# Patient Record
Sex: Male | Born: 1945 | Race: White | Hispanic: No | Marital: Married | Smoking: Former smoker
Health system: Southern US, Community
[De-identification: ages and names within clinical notes are randomized; demographics above are authoritative.]

## PROBLEM LIST (undated history)

## (undated) DIAGNOSIS — K219 Gastro-esophageal reflux disease without esophagitis: Secondary | ICD-10-CM

## (undated) DIAGNOSIS — N183 Chronic kidney disease, stage 3 unspecified: Secondary | ICD-10-CM

## (undated) DIAGNOSIS — K649 Unspecified hemorrhoids: Secondary | ICD-10-CM

## (undated) DIAGNOSIS — M51369 Other intervertebral disc degeneration, lumbar region without mention of lumbar back pain or lower extremity pain: Secondary | ICD-10-CM

## (undated) DIAGNOSIS — K2970 Gastritis, unspecified, without bleeding: Secondary | ICD-10-CM

## (undated) DIAGNOSIS — I5030 Unspecified diastolic (congestive) heart failure: Secondary | ICD-10-CM

## (undated) DIAGNOSIS — D649 Anemia, unspecified: Secondary | ICD-10-CM

## (undated) DIAGNOSIS — E119 Type 2 diabetes mellitus without complications: Secondary | ICD-10-CM

## (undated) DIAGNOSIS — Z794 Long term (current) use of insulin: Secondary | ICD-10-CM

## (undated) DIAGNOSIS — Z77090 Contact with and (suspected) exposure to asbestos: Secondary | ICD-10-CM

## (undated) DIAGNOSIS — I739 Peripheral vascular disease, unspecified: Secondary | ICD-10-CM

## (undated) DIAGNOSIS — J45909 Unspecified asthma, uncomplicated: Secondary | ICD-10-CM

## (undated) DIAGNOSIS — I700 Atherosclerosis of aorta: Secondary | ICD-10-CM

## (undated) DIAGNOSIS — I480 Paroxysmal atrial fibrillation: Secondary | ICD-10-CM

## (undated) DIAGNOSIS — J449 Chronic obstructive pulmonary disease, unspecified: Secondary | ICD-10-CM

## (undated) DIAGNOSIS — M5136 Other intervertebral disc degeneration, lumbar region: Secondary | ICD-10-CM

## (undated) DIAGNOSIS — I10 Essential (primary) hypertension: Secondary | ICD-10-CM

## (undated) DIAGNOSIS — J8410 Pulmonary fibrosis, unspecified: Secondary | ICD-10-CM

## (undated) DIAGNOSIS — E785 Hyperlipidemia, unspecified: Secondary | ICD-10-CM

## (undated) DIAGNOSIS — I50810 Right heart failure, unspecified: Secondary | ICD-10-CM

## (undated) DIAGNOSIS — J189 Pneumonia, unspecified organism: Secondary | ICD-10-CM

## (undated) DIAGNOSIS — B9681 Helicobacter pylori [H. pylori] as the cause of diseases classified elsewhere: Secondary | ICD-10-CM

## (undated) DIAGNOSIS — R0600 Dyspnea, unspecified: Secondary | ICD-10-CM

## (undated) DIAGNOSIS — N471 Phimosis: Secondary | ICD-10-CM

## (undated) DIAGNOSIS — I509 Heart failure, unspecified: Secondary | ICD-10-CM

## (undated) DIAGNOSIS — N400 Enlarged prostate without lower urinary tract symptoms: Secondary | ICD-10-CM

## (undated) DIAGNOSIS — N189 Chronic kidney disease, unspecified: Secondary | ICD-10-CM

## (undated) DIAGNOSIS — M109 Gout, unspecified: Secondary | ICD-10-CM

## (undated) DIAGNOSIS — I499 Cardiac arrhythmia, unspecified: Secondary | ICD-10-CM

## (undated) DIAGNOSIS — Z7901 Long term (current) use of anticoagulants: Secondary | ICD-10-CM

## (undated) DIAGNOSIS — I2510 Atherosclerotic heart disease of native coronary artery without angina pectoris: Secondary | ICD-10-CM

## (undated) DIAGNOSIS — I4891 Unspecified atrial fibrillation: Secondary | ICD-10-CM

## (undated) HISTORY — DX: Atherosclerotic heart disease of native coronary artery without angina pectoris: I25.10

## (undated) HISTORY — DX: Chronic kidney disease, unspecified: N18.9

## (undated) HISTORY — PX: HAND SURGERY: SHX662

## (undated) HISTORY — PX: BACK SURGERY: SHX140

## (undated) HISTORY — PX: CARDIAC ELECTROPHYSIOLOGY MAPPING AND ABLATION: SHX1292

## (undated) HISTORY — DX: Cardiac arrhythmia, unspecified: I49.9

---

## 1997-11-23 ENCOUNTER — Ambulatory Visit (HOSPITAL_COMMUNITY): Admission: RE | Admit: 1997-11-23 | Discharge: 1997-11-23 | Payer: Self-pay | Admitting: Neurological Surgery

## 1998-07-16 ENCOUNTER — Encounter: Payer: Self-pay | Admitting: Neurological Surgery

## 1998-07-16 ENCOUNTER — Ambulatory Visit (HOSPITAL_COMMUNITY): Admission: RE | Admit: 1998-07-16 | Discharge: 1998-07-16 | Payer: Self-pay | Admitting: Neurological Surgery

## 1999-12-09 ENCOUNTER — Ambulatory Visit (HOSPITAL_COMMUNITY): Admission: RE | Admit: 1999-12-09 | Discharge: 1999-12-09 | Payer: Self-pay | Admitting: Family Medicine

## 1999-12-09 ENCOUNTER — Encounter: Payer: Self-pay | Admitting: Family Medicine

## 2000-01-01 ENCOUNTER — Ambulatory Visit (HOSPITAL_COMMUNITY): Admission: RE | Admit: 2000-01-01 | Discharge: 2000-01-01 | Payer: Self-pay | Admitting: Family Medicine

## 2000-01-01 ENCOUNTER — Encounter: Payer: Self-pay | Admitting: Family Medicine

## 2001-09-08 ENCOUNTER — Other Ambulatory Visit: Admission: RE | Admit: 2001-09-08 | Discharge: 2001-09-08 | Payer: Self-pay | Admitting: Family Medicine

## 2014-05-21 DIAGNOSIS — I480 Paroxysmal atrial fibrillation: Secondary | ICD-10-CM | POA: Insufficient documentation

## 2014-05-21 DIAGNOSIS — E785 Hyperlipidemia, unspecified: Secondary | ICD-10-CM | POA: Insufficient documentation

## 2015-10-28 DIAGNOSIS — M5136 Other intervertebral disc degeneration, lumbar region: Secondary | ICD-10-CM | POA: Insufficient documentation

## 2015-11-03 ENCOUNTER — Other Ambulatory Visit: Payer: Self-pay | Admitting: Specialist

## 2015-11-03 DIAGNOSIS — M5136 Other intervertebral disc degeneration, lumbar region: Secondary | ICD-10-CM

## 2015-11-20 ENCOUNTER — Ambulatory Visit
Admission: RE | Admit: 2015-11-20 | Discharge: 2015-11-20 | Disposition: A | Discharge status: Home / Self Care | Payer: Medicare Other | Source: Ambulatory Visit | Attending: Specialist | Admitting: Specialist

## 2015-11-20 DIAGNOSIS — M47816 Spondylosis without myelopathy or radiculopathy, lumbar region: Secondary | ICD-10-CM | POA: Diagnosis not present

## 2015-11-20 DIAGNOSIS — M5136 Other intervertebral disc degeneration, lumbar region: Secondary | ICD-10-CM | POA: Diagnosis not present

## 2016-01-04 ENCOUNTER — Inpatient Hospital Stay
Admission: EM | Admit: 2016-01-04 | Discharge: 2016-01-09 | DRG: 177 | Disposition: A | Discharge status: Home / Self Care | Payer: Medicare Other | Attending: Internal Medicine | Admitting: Internal Medicine

## 2016-01-04 ENCOUNTER — Encounter: Payer: Self-pay | Admitting: Emergency Medicine

## 2016-01-04 ENCOUNTER — Emergency Department: Payer: Medicare Other

## 2016-01-04 DIAGNOSIS — E1165 Type 2 diabetes mellitus with hyperglycemia: Secondary | ICD-10-CM | POA: Diagnosis present

## 2016-01-04 DIAGNOSIS — Z7901 Long term (current) use of anticoagulants: Secondary | ICD-10-CM

## 2016-01-04 DIAGNOSIS — J690 Pneumonitis due to inhalation of food and vomit: Principal | ICD-10-CM | POA: Diagnosis present

## 2016-01-04 DIAGNOSIS — T380X5A Adverse effect of glucocorticoids and synthetic analogues, initial encounter: Secondary | ICD-10-CM | POA: Diagnosis present

## 2016-01-04 DIAGNOSIS — F1910 Other psychoactive substance abuse, uncomplicated: Secondary | ICD-10-CM

## 2016-01-04 DIAGNOSIS — I10 Essential (primary) hypertension: Secondary | ICD-10-CM | POA: Diagnosis present

## 2016-01-04 DIAGNOSIS — E860 Dehydration: Secondary | ICD-10-CM | POA: Diagnosis present

## 2016-01-04 DIAGNOSIS — Z794 Long term (current) use of insulin: Secondary | ICD-10-CM

## 2016-01-04 DIAGNOSIS — I4891 Unspecified atrial fibrillation: Secondary | ICD-10-CM | POA: Diagnosis present

## 2016-01-04 DIAGNOSIS — Z91013 Allergy to seafood: Secondary | ICD-10-CM

## 2016-01-04 DIAGNOSIS — R1110 Vomiting, unspecified: Secondary | ICD-10-CM

## 2016-01-04 DIAGNOSIS — Z79899 Other long term (current) drug therapy: Secondary | ICD-10-CM

## 2016-01-04 DIAGNOSIS — J441 Chronic obstructive pulmonary disease with (acute) exacerbation: Secondary | ICD-10-CM | POA: Diagnosis present

## 2016-01-04 DIAGNOSIS — R042 Hemoptysis: Secondary | ICD-10-CM | POA: Diagnosis present

## 2016-01-04 DIAGNOSIS — N400 Enlarged prostate without lower urinary tract symptoms: Secondary | ICD-10-CM | POA: Diagnosis present

## 2016-01-04 DIAGNOSIS — J9601 Acute respiratory failure with hypoxia: Secondary | ICD-10-CM | POA: Diagnosis present

## 2016-01-04 DIAGNOSIS — Z87891 Personal history of nicotine dependence: Secondary | ICD-10-CM | POA: Diagnosis not present

## 2016-01-04 DIAGNOSIS — E871 Hypo-osmolality and hyponatremia: Secondary | ICD-10-CM | POA: Diagnosis present

## 2016-01-04 DIAGNOSIS — N179 Acute kidney failure, unspecified: Secondary | ICD-10-CM | POA: Diagnosis present

## 2016-01-04 DIAGNOSIS — E785 Hyperlipidemia, unspecified: Secondary | ICD-10-CM | POA: Diagnosis present

## 2016-01-04 DIAGNOSIS — Z7984 Long term (current) use of oral hypoglycemic drugs: Secondary | ICD-10-CM

## 2016-01-04 DIAGNOSIS — Z8249 Family history of ischemic heart disease and other diseases of the circulatory system: Secondary | ICD-10-CM

## 2016-01-04 DIAGNOSIS — R509 Fever, unspecified: Secondary | ICD-10-CM

## 2016-01-04 DIAGNOSIS — J189 Pneumonia, unspecified organism: Secondary | ICD-10-CM | POA: Diagnosis present

## 2016-01-04 HISTORY — DX: Benign prostatic hyperplasia without lower urinary tract symptoms: N40.0

## 2016-01-04 HISTORY — DX: Hyperlipidemia, unspecified: E78.5

## 2016-01-04 HISTORY — DX: Unspecified atrial fibrillation: I48.91

## 2016-01-04 HISTORY — DX: Chronic obstructive pulmonary disease, unspecified: J44.9

## 2016-01-04 HISTORY — DX: Essential (primary) hypertension: I10

## 2016-01-04 HISTORY — DX: Type 2 diabetes mellitus without complications: E11.9

## 2016-01-04 LAB — CBC
HCT: 35.7 % — ABNORMAL LOW (ref 40.0–52.0)
HEMOGLOBIN: 12.2 g/dL — AB (ref 13.0–18.0)
MCH: 30.9 pg (ref 26.0–34.0)
MCHC: 34.1 g/dL (ref 32.0–36.0)
MCV: 90.7 fL (ref 80.0–100.0)
PLATELETS: 172 10*3/uL (ref 150–440)
RBC: 3.94 MIL/uL — AB (ref 4.40–5.90)
RDW: 16.1 % — ABNORMAL HIGH (ref 11.5–14.5)
WBC: 11.8 10*3/uL — ABNORMAL HIGH (ref 3.8–10.6)

## 2016-01-04 LAB — LACTIC ACID, PLASMA
LACTIC ACID, VENOUS: 1.5 mmol/L (ref 0.5–2.0)
Lactic Acid, Venous: 1.7 mmol/L (ref 0.5–2.0)

## 2016-01-04 LAB — TROPONIN I

## 2016-01-04 LAB — GLUCOSE, CAPILLARY: Glucose-Capillary: 237 mg/dL — ABNORMAL HIGH (ref 65–99)

## 2016-01-04 LAB — COMPREHENSIVE METABOLIC PANEL
ALK PHOS: 25 U/L — AB (ref 38–126)
ALT: 15 U/L — AB (ref 17–63)
ANION GAP: 12 (ref 5–15)
AST: 20 U/L (ref 15–41)
Albumin: 3.3 g/dL — ABNORMAL LOW (ref 3.5–5.0)
BILIRUBIN TOTAL: 1.6 mg/dL — AB (ref 0.3–1.2)
BUN: 22 mg/dL — ABNORMAL HIGH (ref 6–20)
CALCIUM: 8.2 mg/dL — AB (ref 8.9–10.3)
CO2: 23 mmol/L (ref 22–32)
CREATININE: 1.44 mg/dL — AB (ref 0.61–1.24)
Chloride: 95 mmol/L — ABNORMAL LOW (ref 101–111)
GFR calc non Af Amer: 47 mL/min — ABNORMAL LOW (ref >60)
GFR, EST AFRICAN AMERICAN: 55 mL/min — AB (ref >60)
Glucose, Bld: 212 mg/dL — ABNORMAL HIGH (ref 65–99)
Potassium: 3.8 mmol/L (ref 3.5–5.1)
Sodium: 130 mmol/L — ABNORMAL LOW (ref 135–145)
TOTAL PROTEIN: 6.9 g/dL (ref 6.5–8.1)

## 2016-01-04 LAB — LIPASE, BLOOD: Lipase: 21 U/L (ref 11–51)

## 2016-01-04 MED ORDER — SODIUM CHLORIDE 0.9% FLUSH
3.0000 mL | Freq: Two times a day (BID) | INTRAVENOUS | Status: DC
  Administered 2016-01-05 – 2016-01-08 (×7): 3 mL via INTRAVENOUS

## 2016-01-04 MED ORDER — METOPROLOL TARTRATE 25 MG PO TABS
25.0000 mg | ORAL_TABLET | Freq: Two times a day (BID) | ORAL | Status: DC
  Administered 2016-01-04 – 2016-01-09 (×10): 25 mg via ORAL
  Filled 2016-01-04 (×10): qty 1

## 2016-01-04 MED ORDER — ONDANSETRON HCL 4 MG/2ML IJ SOLN
4.0000 mg | Freq: Once | INTRAMUSCULAR | Status: AC
  Administered 2016-01-04: 4 mg via INTRAVENOUS
  Filled 2016-01-04: qty 2

## 2016-01-04 MED ORDER — DEXTROSE 5 % IV SOLN: 1.0000 g | Freq: Once | INTRAVENOUS | Status: DC

## 2016-01-04 MED ORDER — INSULIN ASPART 100 UNIT/ML ~~LOC~~ SOLN
0.0000 [IU] | Freq: Every day | SUBCUTANEOUS | Status: DC
  Administered 2016-01-04: 2 [IU] via SUBCUTANEOUS
  Administered 2016-01-05: 4 [IU] via SUBCUTANEOUS
  Filled 2016-01-04: qty 2
  Filled 2016-01-04: qty 4

## 2016-01-04 MED ORDER — LEVOFLOXACIN IN D5W 750 MG/150ML IV SOLN: 750.0000 mg | INTRAVENOUS | Status: DC

## 2016-01-04 MED ORDER — IPRATROPIUM-ALBUTEROL 0.5-2.5 (3) MG/3ML IN SOLN
3.0000 mL | RESPIRATORY_TRACT | Status: DC | PRN
  Administered 2016-01-04 – 2016-01-05 (×3): 3 mL via RESPIRATORY_TRACT
  Filled 2016-01-04 (×4): qty 3

## 2016-01-04 MED ORDER — LEVOFLOXACIN IN D5W 750 MG/150ML IV SOLN
750.0000 mg | Freq: Once | INTRAVENOUS | Status: AC
  Administered 2016-01-04: 750 mg via INTRAVENOUS
  Filled 2016-01-04: qty 150

## 2016-01-04 MED ORDER — TAMSULOSIN HCL 0.4 MG PO CAPS
0.4000 mg | ORAL_CAPSULE | Freq: Every evening | ORAL | Status: DC
  Administered 2016-01-04 – 2016-01-08 (×5): 0.4 mg via ORAL
  Filled 2016-01-04 (×5): qty 1

## 2016-01-04 MED ORDER — DEXTROSE 5 % IV SOLN: 500.0000 mg | Freq: Once | INTRAVENOUS | Status: DC

## 2016-01-04 MED ORDER — ALBUTEROL SULFATE (2.5 MG/3ML) 0.083% IN NEBU
5.0000 mg | INHALATION_SOLUTION | Freq: Once | RESPIRATORY_TRACT | Status: AC
  Administered 2016-01-04: 5 mg via RESPIRATORY_TRACT
  Filled 2016-01-04: qty 6

## 2016-01-04 MED ORDER — ONDANSETRON HCL 4 MG/2ML IJ SOLN
4.0000 mg | Freq: Four times a day (QID) | INTRAMUSCULAR | Status: DC | PRN
  Administered 2016-01-04 – 2016-01-05 (×2): 4 mg via INTRAVENOUS
  Filled 2016-01-04 (×2): qty 2

## 2016-01-04 MED ORDER — ACETAMINOPHEN 650 MG RE SUPP: 650.0000 mg | Freq: Four times a day (QID) | RECTAL | Status: DC | PRN

## 2016-01-04 MED ORDER — OMEGA-3-ACID ETHYL ESTERS 1 G PO CAPS
4.0000 g | ORAL_CAPSULE | Freq: Every day | ORAL | Status: DC
  Administered 2016-01-05 – 2016-01-09 (×5): 4 g via ORAL
  Filled 2016-01-04 (×5): qty 4

## 2016-01-04 MED ORDER — INSULIN GLARGINE 100 UNIT/ML ~~LOC~~ SOLN
70.0000 [IU] | Freq: Two times a day (BID) | SUBCUTANEOUS | Status: DC
  Administered 2016-01-04 – 2016-01-09 (×10): 70 [IU] via SUBCUTANEOUS
  Filled 2016-01-04 (×12): qty 0.7

## 2016-01-04 MED ORDER — SODIUM CHLORIDE 0.9 % IV SOLN
INTRAVENOUS | Status: DC
  Administered 2016-01-04: 20:00:00 via INTRAVENOUS

## 2016-01-04 MED ORDER — METHOCARBAMOL 500 MG PO TABS
500.0000 mg | ORAL_TABLET | Freq: Two times a day (BID) | ORAL | Status: DC
  Administered 2016-01-04 – 2016-01-09 (×10): 500 mg via ORAL
  Filled 2016-01-04 (×10): qty 1

## 2016-01-04 MED ORDER — ATORVASTATIN CALCIUM 20 MG PO TABS
20.0000 mg | ORAL_TABLET | Freq: Every day | ORAL | Status: DC
  Administered 2016-01-04 – 2016-01-08 (×5): 20 mg via ORAL
  Filled 2016-01-04 (×5): qty 1

## 2016-01-04 MED ORDER — ONDANSETRON 4 MG PO TBDP
4.0000 mg | ORAL_TABLET | Freq: Once | ORAL | Status: DC | PRN
Start: 2016-01-04 — End: 2016-01-04
  Filled 2016-01-04: qty 1

## 2016-01-04 MED ORDER — DABIGATRAN ETEXILATE MESYLATE 150 MG PO CAPS
150.0000 mg | ORAL_CAPSULE | Freq: Two times a day (BID) | ORAL | Status: DC
  Administered 2016-01-04 – 2016-01-05 (×2): 150 mg via ORAL
  Filled 2016-01-04 (×4): qty 1

## 2016-01-04 MED ORDER — INSULIN ASPART 100 UNIT/ML ~~LOC~~ SOLN
0.0000 [IU] | Freq: Three times a day (TID) | SUBCUTANEOUS | Status: DC
  Administered 2016-01-05: 2 [IU] via SUBCUTANEOUS
  Administered 2016-01-05: 5 [IU] via SUBCUTANEOUS
  Administered 2016-01-05: 2 [IU] via SUBCUTANEOUS
  Administered 2016-01-06: 5 [IU] via SUBCUTANEOUS
  Filled 2016-01-04 (×2): qty 5
  Filled 2016-01-04 (×2): qty 2

## 2016-01-04 MED ORDER — ONDANSETRON HCL 4 MG PO TABS: 4.0000 mg | ORAL_TABLET | Freq: Four times a day (QID) | ORAL | Status: DC | PRN

## 2016-01-04 MED ORDER — ACETAMINOPHEN 325 MG PO TABS
650.0000 mg | ORAL_TABLET | Freq: Four times a day (QID) | ORAL | Status: DC | PRN
  Administered 2016-01-05 – 2016-01-08 (×3): 650 mg via ORAL
  Filled 2016-01-04 (×3): qty 2

## 2016-01-04 MED ORDER — SODIUM CHLORIDE 0.9 % IV BOLUS (SEPSIS)
1000.0000 mL | Freq: Once | INTRAVENOUS | Status: AC
  Administered 2016-01-04: 1000 mL via INTRAVENOUS

## 2016-01-04 MED ORDER — FLUTICASONE FUROATE-VILANTEROL 100-25 MCG/INH IN AEPB
1.0000 | INHALATION_SPRAY | Freq: Every day | RESPIRATORY_TRACT | Status: DC
  Administered 2016-01-05 – 2016-01-09 (×5): 1 via RESPIRATORY_TRACT
  Filled 2016-01-04: qty 28

## 2016-01-04 NOTE — H&P (Addendum)
Jay Morris at [place] NAME: Jay Morris    MR#:  [id number]  DATE OF BIRTH:  1946-05-17  DATE OF ADMISSION:  01/04/2016  PRIMARY CARE PHYSICIAN: Default, Provider, MD   REQUESTING/REFERRING PHYSICIAN: Dr. Harvest Dark  CHIEF COMPLAINT:   Chief Complaint  Patient presents with  . Emesis  . Fever    HISTORY OF PRESENT ILLNESS:  [name]  is a 70 y.o. male with a known history of COPD, diabetes, hypertension, BPH, atrial fibrillation, hyperlipidemia presents to the hospital due to fevers chills cough and shortness of breath progressively getting worse over the past 3-4 days. Patient says that he has had a cough productive with clear sputum now for the past few days associated with significant shortness of breath and exertion. He also admits to some chills but no documented fever. He also has had some intermittent vomiting associated with his shortness of breath which has been bilious and nonbloody in nature. He complains of some mild chest pain and tightness upon exertion. He presented to the ER with the above complaints and was noted to be in acute respiratory failure secondary to pneumonia and hospitalist services were contacted for further treatment and evaluation.  Patient denies any recent sick contacts or being admitted to the hospital for any other causes recently.  PAST MEDICAL HISTORY:   Past Medical History  Diagnosis Date  . COPD (chronic obstructive pulmonary disease) ([place])   . Diabetes mellitus without complication ([place])   . Hypertension   . BPH (benign prostatic hyperplasia)   . Atrial fibrillation ([place])   . Hyperlipidemia     PAST SURGICAL HISTORY:  No past surgical history on file.  SOCIAL HISTORY:   Social History  Substance Use Topics  . Smoking status: Former Smoker -- 1.00 packs/day for 30 years    Types: Cigarettes  . Smokeless tobacco: Not on file  . Alcohol Use: No    FAMILY HISTORY:   Family  History  Problem Relation Age of Onset  . Heart disease Mother   . Cancer Father     DRUG ALLERGIES:   Allergies  Allergen Reactions  . Shellfish-Derived Products Anaphylaxis    REVIEW OF SYSTEMS:   Review of Systems  Constitutional: Positive for chills. Negative for fever and weight loss.  HENT: Negative for congestion, nosebleeds and tinnitus.   Eyes: Negative for blurred vision, double vision and redness.  Respiratory: Positive for cough, sputum production and shortness of breath. Negative for hemoptysis.   Cardiovascular: Negative for chest pain, orthopnea, leg swelling and PND.  Gastrointestinal: Positive for nausea and vomiting. Negative for abdominal pain, diarrhea and melena.  Genitourinary: Negative for dysuria, urgency and hematuria.  Musculoskeletal: Negative for joint pain and falls.  Neurological: Negative for dizziness, tingling, sensory change, focal weakness, seizures, weakness and headaches.  Endo/Heme/Allergies: Negative for polydipsia. Does not bruise/bleed easily.  Psychiatric/Behavioral: Negative for depression and memory loss. The patient is not nervous/anxious.     MEDICATIONS AT HOME:   Prior to Admission medications   Medication Sig Start Date End Date Taking? Authorizing Provider  atorvastatin (LIPITOR) 20 MG tablet Take 20 mg by mouth at bedtime. 12/09/15  Yes Historical Provider, MD  BREO ELLIPTA 100-25 MCG/INH AEPB Inhale 1 puff into the lungs daily. 12/04/15  Yes Historical Provider, MD  furosemide (LASIX) 20 MG tablet Take 20 mg by mouth daily. 12/29/15  Yes Historical Provider, MD  LANTUS 100 UNIT/ML injection Inject 70 Units into the skin 2 (  two) times daily. 12/29/15  Yes Historical Provider, MD  lisinopril (PRINIVIL,ZESTRIL) 40 MG tablet Take 40 mg by mouth at bedtime.  12/29/15  Yes Historical Provider, MD  metFORMIN (GLUCOPHAGE) 1000 MG tablet Take 1,000 mg by mouth 2 (two) times daily. 12/17/15  Yes Historical Provider, MD  methocarbamol  (ROBAXIN) 500 MG tablet Take 500 mg by mouth 2 (two) times daily. 12/31/15  Yes Historical Provider, MD  metoprolol tartrate (LOPRESSOR) 25 MG tablet Take 25 mg by mouth 2 (two) times daily. 12/04/15  Yes Historical Provider, MD  omega-3 acid ethyl esters (LOVAZA) 1 g capsule Take 4 g by mouth daily. 12/29/15  Yes Historical Provider, MD  PRADAXA 150 MG CAPS capsule Take 150 mg by mouth 2 (two) times daily. 12/11/15  Yes Historical Provider, MD  tamsulosin (FLOMAX) 0.4 MG CAPS capsule Take 0.4 mg by mouth every evening.  12/17/15  Yes Historical Provider, MD      VITAL SIGNS:  Blood pressure 170/80, pulse 105, temperature 99.2 F (37.3 C), temperature source Oral, resp. rate 22, SpO2 92 %.  PHYSICAL EXAMINATION:  Physical Exam  GENERAL:  70 y.o.-year-old patient lying in the bed in no acute distress.  EYES: Pupils equal, round, reactive to light and accommodation. No scleral icterus. Extraocular muscles intact.  HEENT: Head atraumatic, normocephalic. Oropharynx and nasopharynx clear. No oropharyngeal erythema, moist oral mucosa  NECK:  Supple, no jugular venous distention. No thyroid enlargement, no tenderness.  LUNGS: Prolonged inspiratory and expiratory phase, no wheezing, rales, rhonchi. No use of accessory muscles of respiration.  CARDIOVASCULAR: S1, S2 RRR. No murmurs, rubs, gallops, clicks.  ABDOMEN: Soft, nontender, nondistended. Bowel sounds present. No organomegaly or mass.  EXTREMITIES: No pedal edema, cyanosis, or clubbing. + 2 pedal & radial pulses b/l.   NEUROLOGIC: Cranial nerves II through XII are intact. No focal Motor or sensory deficits appreciated b/l PSYCHIATRIC: The patient is alert and oriented x 3. Good affect.  SKIN: No obvious rash, lesion, or ulcer.   LABORATORY PANEL:   CBC  Recent Labs Lab 01/04/16 1633  WBC 11.8*  HGB 12.2*  HCT 35.7*  PLT 172    ------------------------------------------------------------------------------------------------------------------  Chemistries   Recent Labs Lab 01/04/16 1633  NA 130*  K 3.8  CL 95*  CO2 23  GLUCOSE 212*  BUN 22*  CREATININE 1.44*  CALCIUM 8.2*  AST 20  ALT 15*  ALKPHOS 25*  BILITOT 1.6*   ------------------------------------------------------------------------------------------------------------------  Cardiac Enzymes  Recent Labs Lab 01/04/16 1633  TROPONINI <0.03   ------------------------------------------------------------------------------------------------------------------  RADIOLOGY:  Dg Chest 2 View  01/04/2016  CLINICAL DATA:  Fever, nausea and diarrhea for 4 days. EXAM: CHEST  2 VIEW COMPARISON:  None. FINDINGS: There is patchy interstitial airspace opacity in the right upper lobe. There are additional interstitial type opacities in both lung bases. Mild interstitial thickening bilaterally in the lower lungs. Lungs are mildly hyperexpanded. No pleural effusion. No pneumothorax. Cardiac silhouette is normal in size. No mediastinal masses convincing adenopathy. Mild prominence of the right hilum may reflect right hilar adenopathy. Normal left hilum. Bony thorax is demineralized intact. IMPRESSION: Right upper lobe pneumonia superimposed on COPD. Electronically Signed   By: Lajean Manes M.D.   On: 01/04/2016 16:50   Dg Abd 2 Views  01/04/2016  CLINICAL DATA:  Fever, nausea and diarrhea for 4 days. Left-sided abdominal pain today. EXAM: ABDOMEN - 2 VIEW COMPARISON:  None. FINDINGS: Normal bowel gas pattern. No free air. No evidence of renal or ureteral stones. Soft tissues are  unremarkable. Skeletal structures are demineralized with degenerative changes along the lumbar spine. IMPRESSION: 1. No acute finding.  No bowel obstruction. Electronically Signed   By: Lajean Manes M.D.   On: 01/04/2016 16:53     IMPRESSION AND PLAN:   70 year old male with past  medical history of COPD, atrial fibrillation, hypertension, hyperlipidemia, history of BPH who presents to the hospital due to fevers chills, shortness of breath and cough and noted to have pneumonia.  1. Acute respiratory failure with hypoxia-this is secondary to pneumonia. -Continue IV antibiotics for pneumonia, continue O2 supplementation. -Patient has a history of COPD and assessed for home oxygen prior to discharge.  2. Pneumonia-community acquired. -Continue IV Levaquin, follow blood, sputum cultures.  3. Acute kidney injury-secondary to dehydration. We'll place on gentle IV fluids, -Hold Lasix, lisinopril.  4. History of atrial fibrillation-currently rate controlled. -Continue metoprolol, continue Pradaxa.  5. Hyperlipidemia-continue atorvastatin.  6. BPH-continue Flomax.  7. 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 Hold metformin.  8. COPD - no acute exacerbation.  - cont. Breo-ellipta, PRN duonebs.   All the records are reviewed and case discussed with ED provider. Management plans discussed with the patient, family and they are in agreement.  CODE STATUS: Full  TOTAL TIME TAKING CARE OF THIS PATIENT: 45 minutes.    Henreitta Leber M.D on 01/04/2016 at 6:30 PM  Between 7am to 6pm - Pager - [phone]  After 6pm go to [email or web address] - password [email or web address] [facility]  Office  [phone]  CC: Primary care physician; Default, Provider, MD

## 2016-01-04 NOTE — ED Notes (Addendum)
RN called to give report, RN unable to take it at this time. This RN requesting to speak to charge RN, per [facility] manager the once taking report. Will call this RN back momentarily.

## 2016-01-04 NOTE — ED Provider Notes (Signed)
[facility] Emergency Department Provider Note  Time seen: 4:36 PM  I have reviewed the triage vital signs and the nursing notes.   HISTORY  Chief Complaint Emesis and Fever    HPI Jay Morris is a 70 y.o. male with a past medical history of COPD, diabetes, hypertension who presents the emergency department with fever, nausea, vomiting, diarrhea. According to the patient for the past 4 days he has been having subjective fever and chills, intermittent nausea vomiting and diarrhea which have worsened over the past 2 days. States mild shortness of breath, room air saturation of 90% upon arrival to the emergency department. Patient states occasional cough but denies any sputum production. History of pneumonia in the past but states this is not feeling like pneumonia. Patient states he occasionally has central abdominal pain, but denies any currently. States he's had 3 episodes of diarrhea today, in 2-3 episodes of vomiting.     Past Medical History  Diagnosis Date  . COPD (chronic obstructive pulmonary disease) ([place])   . Diabetes mellitus without complication ([place])   . Hypertension     There are no active problems to display for this patient.   No past surgical history on file.  No current outpatient prescriptions on file.  Allergies Review of patient's allergies indicates no known allergies.  No family history on file.  Social History Social History  Substance Use Topics  . Smoking status: Former Research scientist (life sciences)  . Smokeless tobacco: None  . Alcohol Use: No    Review of Systems Constitutional: Subjective fever. 99.2 in the emergency department. Cardiovascular: Negative for chest pain. Occasional cough. Respiratory: Negative for shortness of breath. Gastrointestinal: Occasional central abdominal pain, denies any currently. Positive for nausea, vomiting, diarrhea. Genitourinary: Negative for dysuria. Neurological: Negative for headache 10-point ROS  otherwise negative.  ____________________________________________   PHYSICAL EXAM:  VITAL SIGNS: ED Triage Vitals  Enc Vitals Group     BP 01/04/16 1613 166/60 mmHg     Pulse Rate 01/04/16 1613 105     Resp 01/04/16 1613 20     Temp 01/04/16 1613 99.2 F (37.3 C)     Temp Source 01/04/16 1613 Oral     SpO2 01/04/16 1613 90 %     Weight --      Height --      Head Cir --      Peak Flow --      Pain Score 01/04/16 1620 7     Pain Loc --      Pain Edu? --      Excl. in [place]? --     Constitutional: Alert and oriented. Well appearing and in no distress. Eyes: Normal exam ENT   Head: Normocephalic and atraumatic.   Mouth/Throat: Mucous membranes are moist. Cardiovascular: Normal rate, regular rhythm Around 100 bpm. No murmur Respiratory: Normal respiratory effort without tachypnea nor retractions. Breath sounds are clear. Mild tachypnea around 20-22 breaths per minute. Gastrointestinal: Soft and nontender. Mild distention with tympanic percussion. Musculoskeletal: Nontender with normal range of motion in all extremities Neurologic:  Normal speech and language. No gross focal neurologic deficits Skin:  Skin is warm, dry and intact.  Psychiatric: Mood and affect are normal ____________________________________________    EKG  EKG reviewed and interpreted by myself shows sinus tachycardia at 101 bpm, narrow QRS, normal axis, normal intervals, nonspecific but no concerning ST changes.  ____________________________________________    RADIOLOGY  Right upper lobe pneumonia  ____________________________________________    INITIAL IMPRESSION / ASSESSMENT  AND PLAN / ED COURSE  Pertinent labs & imaging results that were available during my care of the patient were reviewed by me and considered in my medical decision making (see chart for details).  Patient presents the emergency department with 4 days of nausea, vomiting, diarrhea, occasional abdominal pain, occasional  cough, subjective fever. Patient is mildly tachycardic upon arrival, borderline temperature 99 in the emergency department. Patient does desat to around 90% on room air, placed on 2 L nasal cannula. We will check labs, obtain a chest x-ray, abdominal x-ray, and closely monitor in the emergency department.  X-ray consistent right upper lobe pneumonia. Patient meets sepsis criteria with a respiratory rate of 22, pulse rate of 105. Patient continues to desat into the 80s on room air placed on 2 L nasal cannula satting 90-92 percent on 2 L. Given the x-ray with hypoxia, and sepsis criteria we'll admit the patient for IV antibiotics. Patient agreeable to this plan.  CRITICAL CARE Performed by: Harvest Dark   Total critical care time: 30 minutes  Critical care time was exclusive of separately billable procedures and treating other patients.  Critical care was necessary to treat or prevent imminent or life-threatening deterioration.  Critical care was time spent personally by me on the following activities: development of treatment plan with patient and/or surrogate as well as nursing, discussions with consultants, evaluation of patient's response to treatment, examination of patient, obtaining history from patient or surrogate, ordering and performing treatments and interventions, ordering and review of laboratory studies, ordering and review of radiographic studies, pulse oximetry and re-evaluation of patient's condition.  ____________________________________________   FINAL CLINICAL IMPRESSION(S) / ED DIAGNOSES  Fever Sepsis Pneumonia   Harvest Dark, MD 01/04/16 1755

## 2016-01-04 NOTE — Consult Note (Signed)
Pharmacy Antibiotic Note  Robertt Buda is a 70 y.o. male admitted on 01/04/2016 with pneumonia.  Pharmacy has been consulted for levofloxacin dosing.  Plan: levofloxacin 75m q 48 hours  Check scr in the Am as pt is borderline for dose adjustment  Height: _0  (172.7 cm) Weight: 190 lb (86.183 kg) IBW/kg (Calculated) : 68.4  Temp (24hrs), Avg:99.2 F (37.3 C), Min:99.2 F (37.3 C), Max:99.2 F (37.3 C)   Recent Labs Lab 01/04/16 1633  WBC 11.8*  CREATININE 1.44*  LATICACIDVEN 1.5    Estimated Creatinine Clearance: 49.5 mL/min (by C-G formula based on Cr of 1.44).    Allergies  Allergen Reactions  . Shellfish-Derived Products Anaphylaxis    Antimicrobials this admission: levofloxacin 6/18 >>    Dose adjustments this admission:   Microbiology results: 6/18 BCx: pending   Thank you for allowing pharmacy to be a part of this patient's care.  MRamond Dial6/18/2017 7:51 PM

## 2016-01-04 NOTE — ED Notes (Signed)
MD at bedside.

## 2016-01-04 NOTE — ED Notes (Addendum)
Patient transported to Xray, will administer meds once pt returns.

## 2016-01-04 NOTE — ED Notes (Signed)
Pt here with c/o N/V/D, fever, chills since Thursday. Reports taking alka seltzer plus around 1000. Pt's oxygen sat 90% on RA, hx of COPD. Pt placed on 2L via [place].

## 2016-01-04 NOTE — ED Notes (Signed)
MD at bedside. 

## 2016-01-05 ENCOUNTER — Inpatient Hospital Stay: Payer: Medicare Other

## 2016-01-05 LAB — BASIC METABOLIC PANEL
Anion gap: 9 (ref 5–15)
BUN: 19 mg/dL (ref 6–20)
CHLORIDE: 97 mmol/L — AB (ref 101–111)
CO2: 24 mmol/L (ref 22–32)
CREATININE: 1.25 mg/dL — AB (ref 0.61–1.24)
Calcium: 7.7 mg/dL — ABNORMAL LOW (ref 8.9–10.3)
GFR calc non Af Amer: 56 mL/min — ABNORMAL LOW (ref >60)
GLUCOSE: 196 mg/dL — AB (ref 65–99)
Potassium: 3.9 mmol/L (ref 3.5–5.1)
Sodium: 130 mmol/L — ABNORMAL LOW (ref 135–145)

## 2016-01-05 LAB — CBC
HCT: 34.7 % — ABNORMAL LOW (ref 40.0–52.0)
HEMOGLOBIN: 11.8 g/dL — AB (ref 13.0–18.0)
MCH: 31.1 pg (ref 26.0–34.0)
MCHC: 34.1 g/dL (ref 32.0–36.0)
MCV: 91 fL (ref 80.0–100.0)
PLATELETS: 159 10*3/uL (ref 150–440)
RBC: 3.82 MIL/uL — AB (ref 4.40–5.90)
RDW: 15.8 % — ABNORMAL HIGH (ref 11.5–14.5)
WBC: 10.1 10*3/uL (ref 3.8–10.6)

## 2016-01-05 LAB — URINALYSIS COMPLETE WITH MICROSCOPIC (ARMC ONLY)
BILIRUBIN URINE: NEGATIVE
Bacteria, UA: NONE SEEN
Glucose, UA: 50 mg/dL — AB
Hgb urine dipstick: NEGATIVE
KETONES UR: NEGATIVE mg/dL
LEUKOCYTES UA: NEGATIVE
Nitrite: NEGATIVE
PH: 5 (ref 5.0–8.0)
Protein, ur: 100 mg/dL — AB
Specific Gravity, Urine: 1.02 (ref 1.005–1.030)

## 2016-01-05 LAB — GLUCOSE, CAPILLARY
GLUCOSE-CAPILLARY: 325 mg/dL — AB (ref 65–99)
Glucose-Capillary: 190 mg/dL — ABNORMAL HIGH (ref 65–99)
Glucose-Capillary: 193 mg/dL — ABNORMAL HIGH (ref 65–99)
Glucose-Capillary: 296 mg/dL — ABNORMAL HIGH (ref 65–99)

## 2016-01-05 LAB — MAGNESIUM: MAGNESIUM: 1.2 mg/dL — AB (ref 1.7–2.4)

## 2016-01-05 MED ORDER — LISINOPRIL 20 MG PO TABS
40.0000 mg | ORAL_TABLET | Freq: Every day | ORAL | Status: DC
  Administered 2016-01-05 – 2016-01-08 (×4): 40 mg via ORAL
  Filled 2016-01-05 (×4): qty 2

## 2016-01-05 MED ORDER — LEVOFLOXACIN IN D5W 750 MG/150ML IV SOLN
750.0000 mg | INTRAVENOUS | Status: DC
  Administered 2016-01-05: 750 mg via INTRAVENOUS
  Filled 2016-01-05 (×2): qty 150

## 2016-01-05 MED ORDER — FUROSEMIDE 20 MG PO TABS
20.0000 mg | ORAL_TABLET | Freq: Every day | ORAL | Status: DC
  Administered 2016-01-05 – 2016-01-09 (×5): 20 mg via ORAL
  Filled 2016-01-05 (×5): qty 1

## 2016-01-05 MED ORDER — IPRATROPIUM-ALBUTEROL 0.5-2.5 (3) MG/3ML IN SOLN
3.0000 mL | Freq: Once | RESPIRATORY_TRACT | Status: AC
  Administered 2016-01-05: 3 mL via RESPIRATORY_TRACT

## 2016-01-05 MED ORDER — METHYLPREDNISOLONE SODIUM SUCC 125 MG IJ SOLR
60.0000 mg | Freq: Three times a day (TID) | INTRAMUSCULAR | Status: DC
  Administered 2016-01-05 – 2016-01-06 (×3): 60 mg via INTRAVENOUS
  Filled 2016-01-05 (×3): qty 2

## 2016-01-05 MED ORDER — IPRATROPIUM-ALBUTEROL 0.5-2.5 (3) MG/3ML IN SOLN
3.0000 mL | RESPIRATORY_TRACT | Status: DC | PRN
  Filled 2016-01-05: qty 3

## 2016-01-05 MED ORDER — IPRATROPIUM-ALBUTEROL 0.5-2.5 (3) MG/3ML IN SOLN
3.0000 mL | RESPIRATORY_TRACT | Status: DC
  Administered 2016-01-05 – 2016-01-07 (×14): 3 mL via RESPIRATORY_TRACT
  Filled 2016-01-05 (×13): qty 3

## 2016-01-05 MED ORDER — OXYCODONE-ACETAMINOPHEN 5-325 MG PO TABS
1.0000 | ORAL_TABLET | ORAL | Status: DC | PRN
  Administered 2016-01-05 – 2016-01-07 (×7): 1 via ORAL
  Filled 2016-01-05 (×7): qty 1

## 2016-01-05 NOTE — Progress Notes (Signed)
Patient complain of shortness of breath and chronic lower back pain and also has some expiratory wheezing. Patient O2 sat 88% on 2.5L of O2. RN called respiratory and they bumped patient up to 5L sating 93%. MD notified and ordered solu-medrol, lasix, chest x-ray, and oxycodone for pain.   Deri Fuelling, RN

## 2016-01-05 NOTE — Progress Notes (Signed)
Spoke with Dr. Bridgett Larsson and informed him that patient is still in respiratory distress. Order received to discontinued fluids and start patient on Bipap. Stacy in respiratory notified

## 2016-01-05 NOTE — Progress Notes (Signed)
Patient is still on 5L of O2 sating 94%. He has received lasix and solu-medrol and oxycodone for pain. Patient has expressed relief and is not complaining of shortness of breath.   Jay Morris

## 2016-01-05 NOTE — Consult Note (Signed)
Pharmacy Antibiotic Note  Robertson Colclough is a 70 y.o. male admitted on 01/04/2016 with pneumonia.  Pharmacy has been consulted for levofloxacin dosing.  Plan: levofloxacin 758m q 48 hours  Check scr in the Am as pt is borderline for dose adjustment  6/19- Renal fxn improved. Transition to Levaquin 7510mIV q24h.  Height: 5' 8" (172.7 cm) Weight: 190 lb 9.6 oz (86.456 kg) IBW/kg (Calculated) : 68.4  Temp (24hrs), Avg:98.7 F (37.1 C), Min:98.2 F (36.8 C), Max:99.3 F (37.4 C)   Recent Labs Lab 01/04/16 1633 01/04/16 2024 01/05/16 1015  WBC 11.8*  --  10.1  CREATININE 1.44*  --  1.25*  LATICACIDVEN 1.5 1.7  --     Estimated Creatinine Clearance: 57.1 mL/min (by C-G formula based on Cr of 1.25).    Allergies  Allergen Reactions  . Shellfish-Derived Products Anaphylaxis    Antimicrobials this admission: levofloxacin 6/18 >>    Dose adjustments this admission: 6/19 Levaquin changed from 75045m48h to q24h  Microbiology results: 6/18 BCx: pending 6/19 UCx:    Thank you for allowing pharmacy to be a part of this patient's care.  Lachlyn Vanderstelt A 01/05/2016 12:29 PM

## 2016-01-05 NOTE — Progress Notes (Addendum)
[place] at [place] NAME: Jay Morris    MR#:  [id number]  DATE OF BIRTH:  18-Mar-1946  SUBJECTIVE:  CHIEF COMPLAINT:   Chief Complaint  Patient presents with  . Emesis  . Fever   Worsening SOB and wheezing, hemoptysis. SAT down to 80's on O2 [place] 4L,  REVIEW OF SYSTEMS:  CONSTITUTIONAL: No fever, has generalized weakness.  EYES: No blurred or double vision.  EARS, NOSE, AND THROAT: No tinnitus or ear pain.  RESPIRATORY: has cough, shortness of breath, wheezing and hemoptysis.  CARDIOVASCULAR: No chest pain, orthopnea, edema.  GASTROINTESTINAL: No nausea, vomiting, diarrhea or abdominal pain.  GENITOURINARY: No dysuria, hematuria.  ENDOCRINE: No polyuria, nocturia,  HEMATOLOGY: No anemia, easy bruising or bleeding SKIN: No rash or lesion. MUSCULOSKELETAL: back pain.   NEUROLOGIC: No tingling, numbness, weakness.  PSYCHIATRY: No anxiety or depression.   DRUG ALLERGIES:   Allergies  Allergen Reactions  . Shellfish-Derived Products Anaphylaxis    VITALS:  Blood pressure 159/67, pulse 92, temperature 99.1 F (37.3 C), temperature source Oral, resp. rate 20, height _0  (1.727 m), weight 190 lb 9.6 oz (86.456 kg), SpO2 95 %.  PHYSICAL EXAMINATION:  GENERAL:  70 y.o.-year-old patient lying in the bed with no acute distress.  EYES: Pupils equal, round, reactive to light and accommodation. No scleral icterus. Extraocular muscles intact.  HEENT: Head atraumatic, normocephalic. Oropharynx and nasopharynx clear.  NECK:  Supple, no jugular venous distention. No thyroid enlargement, no tenderness.  LUNGS: Dimimished breath sounds bilaterally, expiratory wheezing and rhonchi. Mild use of accessory muscles of respiration.  CARDIOVASCULAR: S1, S2 normal. No murmurs, rubs, or gallops.  ABDOMEN: Soft, nontender, nondistended. Bowel sounds present. No organomegaly or mass.  EXTREMITIES: No pedal edema, cyanosis, or clubbing.   NEUROLOGIC: Cranial nerves II through XII are intact. Muscle strength 5/5 in all extremities. Sensation intact. Gait not checked.  PSYCHIATRIC: The patient is alert and oriented x 3.  SKIN: No obvious rash, lesion, or ulcer.    LABORATORY PANEL:   CBC  Recent Labs Lab 01/05/16 1015  WBC 10.1  HGB 11.8*  HCT 34.7*  PLT 159   ------------------------------------------------------------------------------------------------------------------  Chemistries   Recent Labs Lab 01/04/16 1633 01/05/16 1015  NA 130* 130*  K 3.8 3.9  CL 95* 97*  CO2 23 24  GLUCOSE 212* 196*  BUN 22* 19  CREATININE 1.44* 1.25*  CALCIUM 8.2* 7.7*  AST 20  --   ALT 15*  --   ALKPHOS 25*  --   BILITOT 1.6*  --    ------------------------------------------------------------------------------------------------------------------  Cardiac Enzymes  Recent Labs Lab 01/04/16 1633  TROPONINI <0.03   ------------------------------------------------------------------------------------------------------------------  RADIOLOGY:  Dg Chest 2 View  01/04/2016  CLINICAL DATA:  Fever, nausea and diarrhea for 4 days. EXAM: CHEST  2 VIEW COMPARISON:  None. FINDINGS: There is patchy interstitial airspace opacity in the right upper lobe. There are additional interstitial type opacities in both lung bases. Mild interstitial thickening bilaterally in the lower lungs. Lungs are mildly hyperexpanded. No pleural effusion. No pneumothorax. Cardiac silhouette is normal in size. No mediastinal masses convincing adenopathy. Mild prominence of the right hilum may reflect right hilar adenopathy. Normal left hilum. Bony thorax is demineralized intact. IMPRESSION: Right upper lobe pneumonia superimposed on COPD. Electronically Signed   By: Lajean Manes M.D.   On: 01/04/2016 16:50   Dg Abd 2 Views  01/04/2016  CLINICAL DATA:  Fever, nausea and diarrhea for 4 days. Left-sided abdominal  pain today. EXAM: ABDOMEN - 2 VIEW  COMPARISON:  None. FINDINGS: Normal bowel gas pattern. No free air. No evidence of renal or ureteral stones. Soft tissues are unremarkable. Skeletal structures are demineralized with degenerative changes along the lumbar spine. IMPRESSION: 1. No acute finding.  No bowel obstruction. Electronically Signed   By: Lajean Manes M.D.   On: 01/04/2016 16:53    EKG:   Orders placed or performed during the hospital encounter of 01/04/16  . ED EKG  . ED EKG  . EKG    ASSESSMENT AND PLAN:   70 year old male with past medical history of COPD, atrial fibrillation, hypertension, hyperlipidemia, history of BPH who presents to the hospital due to fevers chills, shortness of breath and cough and noted to have pneumonia.  1. Acute respiratory failure with hypoxia-this is secondary to pneumonia and COPD exacerbation. Start BIPAP, iv solumedrol, duoneb q4h. cont. Breo-ellipta. Repeat CXR.  2. Pneumonia-community acquired. -Continue IV Levaquin, follow blood, sputum cultures.  Hemoptysis due to PNA, he is on pradaxa, hold for now. F/u Hb.   3. Acute kidney injury-secondary to dehydration. Improved with IV fluids, discontinue NS iv. resume Lasix, lisinopril.  4. History of atrial fibrillation-currently rate controlled. -Continue metoprolol, hold Pradaxa.  5. Hyperlipidemia-continue atorvastatin.  6. BPH-continue Flomax.  7. 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 Hold metformin.  Hyponatremia. F/u Na.  All the records are reviewed and case discussed with Care Management/Social Workerr. Management plans discussed with the patient, his wife and they are in agreement.  CODE STATUS: full code.  TOTAL CRITICAL TIME TAKING CARE OF THIS PATIENT: 66 minutes.  Greater than 50% time was spent on coordination of care and face-to-face counseling.  POSSIBLE D/C IN 3 DAYS, DEPENDING ON CLINICAL CONDITION.   Demetrios Loll M.D on 01/05/2016 at 12:37  PM  Between 7am to 6pm - Pager - [phone]  After 6pm go to [email or web address] - password [email or web address] [facility]  Office  [phone]  CC: Primary care physician; Default, Provider, MD

## 2016-01-06 LAB — URINE CULTURE

## 2016-01-06 LAB — GLUCOSE, CAPILLARY
GLUCOSE-CAPILLARY: 411 mg/dL — AB (ref 65–99)
GLUCOSE-CAPILLARY: 330 mg/dL — AB (ref 65–99)
Glucose-Capillary: 298 mg/dL — ABNORMAL HIGH (ref 65–99)
Glucose-Capillary: 339 mg/dL — ABNORMAL HIGH (ref 65–99)

## 2016-01-06 MED ORDER — INSULIN ASPART 100 UNIT/ML ~~LOC~~ SOLN
0.0000 [IU] | Freq: Every day | SUBCUTANEOUS | Status: DC
  Administered 2016-01-06: 4 [IU] via SUBCUTANEOUS
  Administered 2016-01-07: 2 [IU] via SUBCUTANEOUS
  Administered 2016-01-08: 4 [IU] via SUBCUTANEOUS
  Filled 2016-01-06: qty 4
  Filled 2016-01-06: qty 2
  Filled 2016-01-06: qty 4

## 2016-01-06 MED ORDER — INSULIN ASPART 100 UNIT/ML ~~LOC~~ SOLN
6.0000 [IU] | Freq: Three times a day (TID) | SUBCUTANEOUS | Status: DC
  Administered 2016-01-06 – 2016-01-07 (×3): 6 [IU] via SUBCUTANEOUS
  Filled 2016-01-06 (×3): qty 6

## 2016-01-06 MED ORDER — METHYLPREDNISOLONE SODIUM SUCC 40 MG IJ SOLR
40.0000 mg | Freq: Two times a day (BID) | INTRAMUSCULAR | Status: DC
  Administered 2016-01-06 – 2016-01-07 (×2): 40 mg via INTRAVENOUS
  Filled 2016-01-06 (×2): qty 1

## 2016-01-06 MED ORDER — MAGNESIUM SULFATE 4 GM/100ML IV SOLN
4.0000 g | Freq: Once | INTRAVENOUS | Status: AC
  Administered 2016-01-06: 4 g via INTRAVENOUS
  Filled 2016-01-06: qty 100

## 2016-01-06 MED ORDER — DABIGATRAN ETEXILATE MESYLATE 150 MG PO CAPS
150.0000 mg | ORAL_CAPSULE | Freq: Two times a day (BID) | ORAL | Status: DC
  Administered 2016-01-06 – 2016-01-09 (×7): 150 mg via ORAL
  Filled 2016-01-06 (×7): qty 1

## 2016-01-06 MED ORDER — LEVOFLOXACIN 750 MG PO TABS
750.0000 mg | ORAL_TABLET | ORAL | Status: DC
  Administered 2016-01-06 – 2016-01-08 (×3): 750 mg via ORAL
  Filled 2016-01-06 (×3): qty 1

## 2016-01-06 MED ORDER — INSULIN ASPART 100 UNIT/ML ~~LOC~~ SOLN
0.0000 [IU] | Freq: Three times a day (TID) | SUBCUTANEOUS | Status: DC
  Administered 2016-01-06: 15 [IU] via SUBCUTANEOUS
  Administered 2016-01-06: 11 [IU] via SUBCUTANEOUS
  Administered 2016-01-07: 8 [IU] via SUBCUTANEOUS
  Administered 2016-01-07: 3 [IU] via SUBCUTANEOUS
  Administered 2016-01-07: 8 [IU] via SUBCUTANEOUS
  Administered 2016-01-08: 11 [IU] via SUBCUTANEOUS
  Administered 2016-01-08 – 2016-01-09 (×2): 3 [IU] via SUBCUTANEOUS
  Filled 2016-01-06: qty 8
  Filled 2016-01-06: qty 11
  Filled 2016-01-06: qty 3
  Filled 2016-01-06: qty 8
  Filled 2016-01-06: qty 3
  Filled 2016-01-06: qty 15
  Filled 2016-01-06: qty 3
  Filled 2016-01-06: qty 11

## 2016-01-06 NOTE — Progress Notes (Signed)
[facility] at [place] NAME: Jay Morris    MR#:  [id number]  DATE OF BIRTH:  01-14-1946  SUBJECTIVE:  CHIEF COMPLAINT:   Chief Complaint  Patient presents with  . Emesis  . Fever   Better SOB and wheezing, no hemoptysis. on O2 [place] 2L, Off BiPAP. REVIEW OF SYSTEMS:  CONSTITUTIONAL: No fever, has generalized weakness.  EYES: No blurred or double vision.  EARS, NOSE, AND THROAT: No tinnitus or ear pain.  RESPIRATORY: has cough, shortness of breath, wheezing but no hemoptysis.  CARDIOVASCULAR: No chest pain, orthopnea, edema.  GASTROINTESTINAL: No nausea, vomiting, diarrhea or abdominal pain.  GENITOURINARY: No dysuria, hematuria.  ENDOCRINE: No polyuria, nocturia,  HEMATOLOGY: No anemia, easy bruising or bleeding SKIN: No rash or lesion. MUSCULOSKELETAL: back pain.   NEUROLOGIC: No tingling, numbness, weakness.  PSYCHIATRY: No anxiety or depression.   DRUG ALLERGIES:   Allergies  Allergen Reactions  . Shellfish-Derived Products Anaphylaxis    VITALS:  Blood pressure 132/56, pulse 90, temperature 97.9 F (36.6 C), temperature source Oral, resp. rate 18, height _0  (1.727 m), weight 190 lb 9.6 oz (86.456 kg), SpO2 92 %.  PHYSICAL EXAMINATION:  GENERAL:  70 y.o.-year-old patient lying in the bed with no acute distress.  EYES: Pupils equal, round, reactive to light and accommodation. No scleral icterus. Extraocular muscles intact.  HEENT: Head atraumatic, normocephalic. Oropharynx and nasopharynx clear.  NECK:  Supple, no jugular venous distention. No thyroid enlargement, no tenderness.  LUNGS: Dimimished breath sounds bilaterally, no wheezing or rhonchi. Mild use of accessory muscles of respiration.  CARDIOVASCULAR: S1, S2 normal. No murmurs, rubs, or gallops.  ABDOMEN: Soft, nontender, nondistended. Bowel sounds present. No organomegaly or mass.  EXTREMITIES: No pedal edema, cyanosis, or clubbing.  NEUROLOGIC: Cranial  nerves II through XII are intact. Muscle strength 4/5 in all extremities. Sensation intact. Gait not checked.  PSYCHIATRIC: The patient is alert and oriented x 3.  SKIN: No obvious rash, lesion, or ulcer.    LABORATORY PANEL:   CBC  Recent Labs Lab 01/05/16 1015  WBC 10.1  HGB 11.8*  HCT 34.7*  PLT 159   ------------------------------------------------------------------------------------------------------------------  Chemistries   Recent Labs Lab 01/04/16 1633 01/05/16 1015 01/05/16 1252  NA 130* 130*  --   K 3.8 3.9  --   CL 95* 97*  --   CO2 23 24  --   GLUCOSE 212* 196*  --   BUN 22* 19  --   CREATININE 1.44* 1.25*  --   CALCIUM 8.2* 7.7*  --   MG  --   --  1.2*  AST 20  --   --   ALT 15*  --   --   ALKPHOS 25*  --   --   BILITOT 1.6*  --   --    ------------------------------------------------------------------------------------------------------------------  Cardiac Enzymes  Recent Labs Lab 01/04/16 1633  TROPONINI <0.03   ------------------------------------------------------------------------------------------------------------------  RADIOLOGY:  Dg Chest 2 View  01/04/2016  CLINICAL DATA:  Fever, nausea and diarrhea for 4 days. EXAM: CHEST  2 VIEW COMPARISON:  None. FINDINGS: There is patchy interstitial airspace opacity in the right upper lobe. There are additional interstitial type opacities in both lung bases. Mild interstitial thickening bilaterally in the lower lungs. Lungs are mildly hyperexpanded. No pleural effusion. No pneumothorax. Cardiac silhouette is normal in size. No mediastinal masses convincing adenopathy. Mild prominence of the right hilum may reflect right hilar adenopathy. Normal left hilum. Bony thorax  is demineralized intact. IMPRESSION: Right upper lobe pneumonia superimposed on COPD. Electronically Signed   By: Lajean Manes M.D.   On: 01/04/2016 16:50   Dg Chest Port 1 View  01/05/2016  CLINICAL DATA:  70 year old male with  right upper lobe pneumonia suspected radiographically. Fever nausea and diarrhea recently. Initial encounter. EXAM: PORTABLE CHEST 1 VIEW COMPARISON:  01/04/2016. FINDINGS: Portable AP upright view at 1505 hours. Progressed and more confluent reticulonodular opacity in the right upper lobe. Lung parenchyma elsewhere appears stable when allowing for portable technique. No pleural effusion identified. Stable cardiac size and mediastinal contours. Visualized tracheal air column is within normal limits. IMPRESSION: Progressed right upper lobe reticulonodular opacity most compatible with pneumonia. No pleural effusion identified. Electronically Signed   By: Genevie Ann M.D.   On: 01/05/2016 15:23   Dg Abd 2 Views  01/04/2016  CLINICAL DATA:  Fever, nausea and diarrhea for 4 days. Left-sided abdominal pain today. EXAM: ABDOMEN - 2 VIEW COMPARISON:  None. FINDINGS: Normal bowel gas pattern. No free air. No evidence of renal or ureteral stones. Soft tissues are unremarkable. Skeletal structures are demineralized with degenerative changes along the lumbar spine. IMPRESSION: 1. No acute finding.  No bowel obstruction. Electronically Signed   By: Lajean Manes M.D.   On: 01/04/2016 16:53    EKG:   Orders placed or performed during the hospital encounter of 01/04/16  . ED EKG  . ED EKG  . EKG    ASSESSMENT AND PLAN:   70 year old male with past medical history of COPD, atrial fibrillation, hypertension, hyperlipidemia, history of BPH who presents to the hospital due to fevers chills, shortness of breath and cough and noted to have pneumonia.  1. Acute respiratory failure with hypoxia-this is secondary to pneumonia and COPD exacerbation. off BIPAP, taper iv solumedrol, continue duoneb q4h when necessary. cont. Breo-ellipta. Try to wean off O2 [place].  2. Pneumonia-community acquired. -Continue IV Levaquin, Negative blood cultures so far.  Hemoptysis due to PNA, improved, resume pradaxa. Follow hemoglobin.  3.  Acute kidney injury-secondary to dehydration. Improved with IV fluids, discontinued NS iv. resumed Lasix, lisinopril.  4. History of atrial fibrillation-currently rate controlled. -Continue metoprolol, hold Pradaxa.  5. Hyperlipidemia-continue atorvastatin.  6. BPH-continue Flomax.  7. Diabetes type 2 without complication with hyperglycemia. Glucose level is 411. -continue Lantus 70 units bid, sliding scale insulin. Hold metformin. 8 NovoLog 6 units before meals.  Hyponatremia. F/u Na.  Hypomagnesemia. Give IV magnesium and a follow-up level.  All the records are reviewed and case discussed with Care Management/Social Workerr. Management plans discussed with the patient, his wife and they are in agreement.  CODE STATUS: full code.  TOTAL TIME TAKING CARE OF THIS PATIENT: 39 minutes.  Greater than 50% time was spent on coordination of care and face-to-face counseling.  POSSIBLE D/C IN 3 DAYS, DEPENDING ON CLINICAL CONDITION.   Demetrios Loll M.D on 01/06/2016 at 12:50 PM  Between 7am to 6pm - Pager - [phone]  After 6pm go to [email or web address] - password [email or web address] [place] [facility]  Office  [phone]  CC: Primary care physician; Default, Provider, MD

## 2016-01-06 NOTE — Plan of Care (Signed)
Pt O2 was lowed to 2L - 92%SPO2 at rest.  Will further assess with ambulation.

## 2016-01-06 NOTE — Progress Notes (Signed)
Inpatient Diabetes Program Recommendations  AACE/ADA: New Consensus Statement on Inpatient Glycemic Control (2015)  Target Ranges:  Prepandial:   less than 140 mg/dL      Peak postprandial:   less than 180 mg/dL (1-2 hours)      Critically ill patients:  140 - 180 mg/dL   Results for Jay Morris, Jay Morris (MRN [id number]) as of 01/06/2016 08:44  Ref. Range 01/05/2016 07:23 01/05/2016 10:54 01/05/2016 16:15 01/05/2016 20:57 01/06/2016 07:26  Glucose-Capillary Latest Ref Range: 65-99 mg/dL 190 (H) 193 (H) 296 (H) 325 (H) 298 (H)   Review of Glycemic Control  Diabetes history: DM2 Outpatient Diabetes medications: Lantus 70 units BID with meals, Metformin 1000 mg BID Current orders for Inpatient glycemic control: Lantus 70 units BID, Novolog 0-9 units TID with meals, Novolog 0-5 units QHS  Inpatient Diabetes Program Recommendations: Insulin - Meal Coverage: If steroids are continued, please consider ordering Novolog 5 units TID with meals for meal coverage. A1C: Please consider ordering an A1C to evaluate glycemic control over the past 2-3 months.  Thanks, Barnie Alderman, RN, MSN, CDE Diabetes Coordinator Inpatient Diabetes Program [phone] (Team Pager from [place] to [place]) [phone] ([place] office) [phone] [facility] office) [phone] [facility] office)

## 2016-01-06 NOTE — Plan of Care (Signed)
Dr. Ree Kida of [facility] - new orders placed.

## 2016-01-06 NOTE — Care Management Important Message (Signed)
Important Message  Patient Details  Name: Jay Morris MRN: [id number] Date of Birth: Jan 28, 1946   Medicare Important Message Given:  Yes    Juliann Pulse A Berel Najjar 01/06/2016, 2:05 PM

## 2016-01-06 NOTE — Care Management (Addendum)
Patient still requiring O2 at 4 liters per flow sheet. Spoke with patient's wife regarding name of [facility] provider she and patient want to be set up with. She said she'd call this RNCM this afternoon as she still needs to talk to her daughter. RNCM will continue to follow. O2 referral to [facility]. Need O2 assessment/template entered (good for 48 hours). Please consider flutter valve. Encouraged IS use every hour. Home health list left with patient. Received callback from patient's wife requesting appointment with Dr Fulton Reek. I contacted Dr. Doy Hutching office and they will seek his approval for both wife and patient. Wife updated. Patient now on 2 liters O2.

## 2016-01-07 LAB — BASIC METABOLIC PANEL
Anion gap: 11 (ref 5–15)
BUN: 42 mg/dL — ABNORMAL HIGH (ref 6–20)
CHLORIDE: 95 mmol/L — AB (ref 101–111)
CO2: 26 mmol/L (ref 22–32)
CREATININE: 1.41 mg/dL — AB (ref 0.61–1.24)
Calcium: 8.4 mg/dL — ABNORMAL LOW (ref 8.9–10.3)
GFR calc non Af Amer: 48 mL/min — ABNORMAL LOW (ref >60)
GFR, EST AFRICAN AMERICAN: 56 mL/min — AB (ref >60)
Glucose, Bld: 203 mg/dL — ABNORMAL HIGH (ref 65–99)
Potassium: 3.8 mmol/L (ref 3.5–5.1)
Sodium: 132 mmol/L — ABNORMAL LOW (ref 135–145)

## 2016-01-07 LAB — GLUCOSE, CAPILLARY
GLUCOSE-CAPILLARY: 292 mg/dL — AB (ref 65–99)
GLUCOSE-CAPILLARY: 285 mg/dL — AB (ref 65–99)
GLUCOSE-CAPILLARY: 250 mg/dL — AB (ref 65–99)

## 2016-01-07 LAB — HEMOGLOBIN: Hemoglobin: 11.5 g/dL — ABNORMAL LOW (ref 13.0–18.0)

## 2016-01-07 LAB — HEMOGLOBIN A1C: HEMOGLOBIN A1C: 8 % — AB (ref 4.0–6.0)

## 2016-01-07 LAB — MAGNESIUM: Magnesium: 2.7 mg/dL — ABNORMAL HIGH (ref 1.7–2.4)

## 2016-01-07 MED ORDER — IPRATROPIUM-ALBUTEROL 0.5-2.5 (3) MG/3ML IN SOLN
3.0000 mL | Freq: Four times a day (QID) | RESPIRATORY_TRACT | Status: DC
  Administered 2016-01-08 (×2): 3 mL via RESPIRATORY_TRACT
  Filled 2016-01-07 (×2): qty 3

## 2016-01-07 MED ORDER — INSULIN ASPART 100 UNIT/ML ~~LOC~~ SOLN
10.0000 [IU] | Freq: Three times a day (TID) | SUBCUTANEOUS | Status: DC
  Administered 2016-01-07 – 2016-01-09 (×5): 10 [IU] via SUBCUTANEOUS
  Filled 2016-01-07 (×3): qty 10
  Filled 2016-01-07: qty 11
  Filled 2016-01-07 (×2): qty 10

## 2016-01-07 MED ORDER — PREDNISONE 20 MG PO TABS
40.0000 mg | ORAL_TABLET | Freq: Every day | ORAL | Status: DC
  Administered 2016-01-08 – 2016-01-09 (×2): 40 mg via ORAL
  Filled 2016-01-07 (×2): qty 2

## 2016-01-07 MED ORDER — TRAMADOL HCL 50 MG PO TABS
50.0000 mg | ORAL_TABLET | Freq: Four times a day (QID) | ORAL | Status: DC | PRN
  Administered 2016-01-07 – 2016-01-09 (×4): 50 mg via ORAL
  Filled 2016-01-07 (×4): qty 1

## 2016-01-07 NOTE — Progress Notes (Signed)
[facility] at [place] NAME: Jay Morris    MR#:  [id number]  DATE OF BIRTH:  [birth year]  SUBJECTIVE:  CHIEF COMPLAINT:   Chief Complaint  Patient presents with  . Emesis  . Fever   Still has shortness of breath on minimal ambulation. Sitting in a chair. On 2 L oxygen. Afebrile. Wife at bedside.  REVIEW OF SYSTEMS:  CONSTITUTIONAL: No fever, has generalized weakness.  EYES: No blurred or double vision.  EARS, NOSE, AND THROAT: No tinnitus or ear pain.  RESPIRATORY: has cough, shortness of breath, wheezing but no hemoptysis.  CARDIOVASCULAR: No chest pain, orthopnea, edema.  GASTROINTESTINAL: No nausea, vomiting, diarrhea or abdominal pain.  GENITOURINARY: No dysuria, hematuria.  ENDOCRINE: No polyuria, nocturia,  HEMATOLOGY: No anemia, easy bruising or bleeding SKIN: No rash or lesion. MUSCULOSKELETAL: back pain.   NEUROLOGIC: No tingling, numbness, weakness.  PSYCHIATRY: No anxiety or depression.   DRUG ALLERGIES:   Allergies  Allergen Reactions  . Shellfish-Derived Products Anaphylaxis    VITALS:  Blood pressure 124/68, pulse 77, temperature 97.6 F (36.4 C), temperature source Oral, resp. rate 18, height _0  (1.727 m), weight 86.456 kg (190 lb 9.6 oz), SpO2 97 %.  PHYSICAL EXAMINATION:  GENERAL:  70 y.o.-year-old patient lying in the bed with no acute distress. Morbidly obese EYES: Pupils equal, round, reactive to light and accommodation. No scleral icterus. Extraocular muscles intact.  HEENT: Head atraumatic, normocephalic. Oropharynx and nasopharynx clear.  NECK:  Supple, no jugular venous distention. No thyroid enlargement, no tenderness.  LUNGS: Dimimished breath sounds bilaterally, no wheezing or rhonchi.  CARDIOVASCULAR: S1, S2 normal. No murmurs, rubs, or gallops.  ABDOMEN: Soft, nontender, nondistended. Bowel sounds present. No organomegaly or mass.  EXTREMITIES: No pedal edema, cyanosis, or clubbing.   NEUROLOGIC: Cranial nerves II through XII are intact. Muscle strength 5/5 in all extremities. Sensation intact. Gait not checked.  PSYCHIATRIC: The patient is alert and oriented x 3.  SKIN: No obvious rash, lesion, or ulcer.    LABORATORY PANEL:   CBC  Recent Labs Lab 01/05/16 1015 01/07/16 0612  WBC 10.1  --   HGB 11.8* 11.5*  HCT 34.7*  --   PLT 159  --    ------------------------------------------------------------------------------------------------------------------  Chemistries   Recent Labs Lab 01/04/16 1633  01/07/16 0612  NA 130*  < > 132*  K 3.8  < > 3.8  CL 95*  < > 95*  CO2 23  < > 26  GLUCOSE 212*  < > 203*  BUN 22*  < > 42*  CREATININE 1.44*  < > 1.41*  CALCIUM 8.2*  < > 8.4*  MG  --   < > 2.7*  AST 20  --   --   ALT 15*  --   --   ALKPHOS 25*  --   --   BILITOT 1.6*  --   --   < > = values in this interval not displayed. ------------------------------------------------------------------------------------------------------------------  Cardiac Enzymes  Recent Labs Lab 01/04/16 1633  TROPONINI <0.03   ------------------------------------------------------------------------------------------------------------------  RADIOLOGY:  Dg Chest Port 1 View  01/05/2016  CLINICAL DATA:  70 year old male with right upper lobe pneumonia suspected radiographically. Fever nausea and diarrhea recently. Initial encounter. EXAM: PORTABLE CHEST 1 VIEW COMPARISON:  01/04/2016. FINDINGS: Portable AP upright view at 1505 hours. Progressed and more confluent reticulonodular opacity in the right upper lobe. Lung parenchyma elsewhere appears stable when allowing for portable technique. No pleural effusion identified. Stable  cardiac size and mediastinal contours. Visualized tracheal air column is within normal limits. IMPRESSION: Progressed right upper lobe reticulonodular opacity most compatible with pneumonia. No pleural effusion identified. Electronically Signed   By: Genevie Ann M.D.   On: 01/05/2016 15:23    EKG:   Orders placed or performed during the hospital encounter of 01/04/16  . ED EKG  . ED EKG  . EKG    ASSESSMENT AND PLAN:   70 year old male with past medical history of COPD, atrial fibrillation, hypertension, hyperlipidemia, history of BPH who presents to the hospital due to fevers chills, shortness of breath and cough and noted to have pneumonia.  1. Right upper lobe pneumonia with COPD exacerbation and acute hypoxic respiratory failure -IV steroids, Antibiotics - Scheduled Nebulizers - Inhalers -Wean O2 as tolerated - Change to prednisone from tomorrow He did have mild hemoptysis which has resolved. Pradaxa restarted.   2. Acute kidney injury-secondary to dehydration. Improved with IV fluids, discontinued NS iv. resumed Lasix, lisinopril.  3. History of atrial fibrillation-currently rate controlled. -Continue metoprolol, hold Pradaxa.  4. Hyperlipidemia-continue atorvastatin.  5. BPH-continue Flomax.  7. Insulin-dependent diabetes mellitus. Continue Lantus and sliding scale insulin. Increased pain meal insulin dose.  All the records are reviewed and case discussed with Care Management/Social Workerr. Management plans discussed with the patient, his wife and they are in agreement.  CODE STATUS: Full code.  TOTAL TIME TAKING CARE OF THIS PATIENT: 30 minutes.   Possible discharge tomorrow. May need home oxygen.   Hillary Bow R M.D on 01/07/2016 at 2:58 PM  Between 7am to 6pm - Pager - [phone]  After 6pm go to [email or web address] - password [email or web address] [place] [facility]  Office  [phone]  CC: Primary care physician; Default, Provider, MD

## 2016-01-07 NOTE — Evaluation (Signed)
Physical Therapy Evaluation Patient Details Name: Jay Morris MRN: [id number] DOB: [birth year] Today's Date: 01/07/2016   History of Present Illness  Pt is a 70 y.o. male presenting to hospital with fever, N/V, diarrhea, cough, and SOB.  Pt admitted to hospital with acute respiratory failure with hypoxia secondary to PNA.  PMH includes COPD, DM, htn, and a-fib.  Clinical Impression  Prior to admission, pt was independent with functional mobility (no home O2).  Pt lives with his wife in a 1 level home.  Currently pt is SBA supine to sit and CGA with transfers and ambulation 120 feet no AD.  Pt appearing mildly unsteady at slower gait speed (ambulating) but anticipate this will continue to improve with continued ambulation during hospital stay (no loss of balance noted during session with functional mobility).  Pt's O2 (on 2 L/min O2 via nasal cannula) was 95% at rest and decreased to 87% after 80 feet of ambulation; increased O2 briefly to 3 L/min for rest of ambulation back to room and O2 88-89%; pt's supplemental O2 decreased back to 2 L/min end of session with O2 95% again at rest.  Pt would benefit from skilled PT to address noted impairments and functional limitations during hospital stay.  Recommend pt discharge to home with support of family when medically appropriate.     Follow Up Recommendations No PT follow up    Equipment Recommendations   (None anticipated)    Recommendations for Other Services       Precautions / Restrictions Precautions Precautions: Fall Restrictions Weight Bearing Restrictions: No      Mobility  Bed Mobility Overal bed mobility: Needs Assistance Bed Mobility: Supine to Sit     Supine to sit: Supervision;HOB elevated        Transfers Overall transfer level: Needs assistance Equipment used: None Transfers: Sit to/from Stand Sit to Stand: Min guard         General transfer comment: strong stand; steady without loss of  balance  Ambulation/Gait Ambulation/Gait assistance: Min guard Ambulation Distance (Feet): 120 Feet Assistive device: None   Gait velocity: decreased   General Gait Details: increased BOS; decreased B step length/foot clearance/heelstrike  Stairs            Wheelchair Mobility    Modified Rankin (Stroke Patients Only)       Balance Overall balance assessment: Needs assistance Sitting-balance support: No upper extremity supported;Feet supported Sitting balance-Leahy Scale: Normal     Standing balance support: No upper extremity supported;During functional activity (with ambulation) Standing balance-Leahy Scale: Good                               Pertinent Vitals/Pain Pain Assessment: No/denies pain    Home Living Family/patient expects to be discharged to:: Private residence Living Arrangements: Spouse/significant other Available Help at Discharge: Family Type of Home: House Home Access: Stairs to enter   [employer] of Steps: 1 step no rail or level entry from back door Home Layout: One level Home Equipment: None      Prior Function Level of Independence: Independent         Comments: Pt denies any falls in past 6 months.     Hand Dominance        Extremity/Trunk Assessment   Upper Extremity Assessment: Generalized weakness           Lower Extremity Assessment: Generalized weakness         Communication  Communication: No difficulties  Cognition Arousal/Alertness: Awake/alert Behavior During Therapy: WFL for tasks assessed/performed Overall Cognitive Status: Within Functional Limits for tasks assessed                      General Comments   Nursing cleared pt for participation in physical therapy.  Pt agreeable to PT session.  Pt's wife present during session.    Exercises Other Exercises Other Exercises: Purse lip breathing ex's and incentive spirometer use x10 reps (pt requiring vc's for  technique); x8 minutes      Assessment/Plan    PT Assessment Patient needs continued PT services  PT Diagnosis Difficulty walking;Generalized weakness   PT Problem List Decreased strength;Decreased activity tolerance;Decreased balance;Decreased mobility  PT Treatment Interventions DME instruction;Gait training;Stair training;Functional mobility training;Therapeutic activities;Therapeutic exercise;Balance training;Patient/family education   PT Goals (Current goals can be found in the Care Plan section) Acute Rehab PT Goals Patient Stated Goal: to go home PT Goal Formulation: With patient/family Time For Goal Achievement: 01/21/16 Potential to Achieve Goals: Good    Frequency Min 2X/week   Barriers to discharge        Co-evaluation               End of Session Equipment Utilized During Treatment: Gait belt;Oxygen Activity Tolerance:  (Limited d/t O2 desaturation with activity) Patient left: in chair;with call bell/phone within reach;with chair alarm set;with family/visitor present Nurse Communication: Mobility status;Precautions         Time: 9892-1194 PT Time Calculation (min) (ACUTE ONLY): 23 min   Charges:   PT Evaluation $PT Eval Low Complexity: 1 Procedure PT Treatments $Therapeutic Exercise: 8-22 mins   PT G CodesLeitha Bleak 01/27/16, 10:27 AM Leitha Bleak, [place]

## 2016-01-07 NOTE — Clinical Documentation Improvement (Signed)
Internal Medicine  Please clarify if the following diagnosis, Sepsis was:   Present at the time of admission (POA)  NOT present at the time of admission and it developed during the inpatient stay  Unable to clinically determine whether the condition was present on admission.  Unknown   Supporting Information: PNA , elev resp and pulse, LA 1.7    Please exercise your independent, professional judgment when responding. A specific answer is not anticipated or expected.   Thank You,  [place] [phone]

## 2016-01-07 NOTE — Progress Notes (Signed)
Inpatient Diabetes Program Recommendations  AACE/ADA: New Consensus Statement on Inpatient Glycemic Control (2015)  Target Ranges:  Prepandial:   less than 140 mg/dL      Peak postprandial:   less than 180 mg/dL (1-2 hours)      Critically ill patients:  140 - 180 mg/dL  Results for DREXLER, MALAND (MRN [id number]) as of 01/07/2016 08:54  Ref. Range 01/07/2016 06:12  Glucose Latest Ref Range: 65-99 mg/dL 203 (H)   Results for Jay Morris, Jay Morris (MRN [id number]) as of 01/07/2016 08:54  Ref. Range 01/06/2016 07:26 01/06/2016 11:14 01/06/2016 16:03 01/06/2016 21:04  Glucose-Capillary Latest Ref Range: 65-99 mg/dL 298 (H) 411 (H) 339 (H) 330 (H)   Review of Glycemic Control  Diabetes history: DM2 Outpatient Diabetes medications: Lantus 70 units BID with meals, Metformin 1000 mg BID Current orders for Inpatient glycemic control: Lantus 70 units BID, Novolog 0-15 units TID with meals, Novolog 0-5 units QHS, Novolog 6 units TID with meals for meal coverage   Inpatient Diabetes Program Recommendations: Insulin - Meal Coverage: If steroids are continued, please consider ordering increasing meal coverage to Novolog 10 units TID with meals if patient eats at least 50% of meals. HgbA1C: A1C in process.  Thanks, Barnie Alderman, RN, MSN, CDE Diabetes Coordinator Inpatient Diabetes Program [phone] (Team Pager from [place] to [place]) [phone] ([place] office) [phone] [facility] office) [phone] [facility] office)

## 2016-01-07 NOTE — Discharge Planning (Signed)
SATURATION QUALIFICATIONS: (This note is used to comply with regulatory documentation for home oxygen)  Patient Saturations on Room Air at Rest = 87%  Please briefly explain why patient needs home oxygen: COPD

## 2016-01-07 NOTE — Care Management (Signed)
O2 assessment needed. Patient low 90's on 2 liters COPD. [facility]erral [facility].

## 2016-01-08 LAB — GLUCOSE, CAPILLARY
GLUCOSE-CAPILLARY: 114 mg/dL — AB (ref 65–99)
GLUCOSE-CAPILLARY: 304 mg/dL — AB (ref 65–99)
Glucose-Capillary: 63 mg/dL — ABNORMAL LOW (ref 65–99)
Glucose-Capillary: 184 mg/dL — ABNORMAL HIGH (ref 65–99)
Glucose-Capillary: 346 mg/dL — ABNORMAL HIGH (ref 65–99)

## 2016-01-08 MED ORDER — IPRATROPIUM-ALBUTEROL 0.5-2.5 (3) MG/3ML IN SOLN
3.0000 mL | Freq: Three times a day (TID) | RESPIRATORY_TRACT | Status: DC
  Administered 2016-01-08 – 2016-01-09 (×2): 3 mL via RESPIRATORY_TRACT
  Filled 2016-01-08 (×2): qty 3

## 2016-01-08 NOTE — Progress Notes (Signed)
Received a call from Friona at [facility] that pt had paroxysmal supraventricular tachycardia with heart rate of 155 that lasted for 3 seconds. Went in to the pt's room, pt comfortably sitting on bed, not in any distress and having some snacks brought by wife. Denies any chest pain this time. Will continue to monitor.

## 2016-01-08 NOTE — Progress Notes (Signed)
Inpatient Diabetes Program Recommendations  AACE/ADA: New Consensus Statement on Inpatient Glycemic Control (2015)  Target Ranges:  Prepandial:   less than 140 mg/dL      Peak postprandial:   less than 180 mg/dL (1-2 hours)      Critically ill patients:  140 - 180 mg/dL   Lab Results  Component Value Date   GLUCAP 114* 01/08/2016   HGBA1C 8.0* 01/07/2016    Review of Glycemic Control  Results for AUDEL, COAKLEY (MRN [id number]) as of 01/08/2016 10:59  Ref. Range 01/07/2016 11:47 01/07/2016 16:43 01/07/2016 21:41 01/08/2016 07:23 01/08/2016 07:59  Glucose-Capillary Latest Ref Range: 65-99 mg/dL 292 (H) 285 (H) 250 (H) 63 (L) 114 (H)    Diabetes history: DM2 Outpatient Diabetes medications: Lantus 70 units BID with meals, Metformin 1000 mg BID Current orders for Inpatient glycemic control: Lantus 70 units BID, Novolog 0-15 units TID with meals, Novolog 0-5 units QHS, Novolog 10 units TID with meals for meal coverage   Inpatient Diabetes Program Recommendations: Noted low blood sugar this am- do not recommend any changes to current diabetes medications.  Gentry Fitz, RN, BA, MHA, CDE Diabetes Coordinator Inpatient Diabetes Program  [phone] (Team Pager) [phone] ([place]) 01/08/2016 11:41 AM

## 2016-01-08 NOTE — Progress Notes (Signed)
[facility] at [place] NAME: Draxton Luu    MR#:  [id number]  DATE OF BIRTH:  November 08, 1945  SUBJECTIVE:  CHIEF COMPLAINT:   Chief Complaint  Patient presents with  . Emesis  . Fever   Still has shortness of breath on minimal ambulation. Sitting in a chair. On 2 L oxygen. Afebrile. Wife at bedside.  REVIEW OF SYSTEMS:  CONSTITUTIONAL: No fever, has generalized weakness.  EYES: No blurred or double vision.  EARS, NOSE, AND THROAT: No tinnitus or ear pain.  RESPIRATORY: has cough, shortness of breath, wheezing but no hemoptysis.  CARDIOVASCULAR: No chest pain, orthopnea, edema.  GASTROINTESTINAL: No nausea, vomiting, diarrhea or abdominal pain.  GENITOURINARY: No dysuria, hematuria.  ENDOCRINE: No polyuria, nocturia,  HEMATOLOGY: No anemia, easy bruising or bleeding SKIN: No rash or lesion. MUSCULOSKELETAL: back pain.   NEUROLOGIC: No tingling, numbness, weakness.  PSYCHIATRY: No anxiety or depression.   DRUG ALLERGIES:   Allergies  Allergen Reactions  . Shellfish-Derived Products Anaphylaxis    VITALS:  Blood pressure 145/74, pulse 82, temperature 97.9 F (36.6 C), temperature source Oral, resp. rate 16, height 5' 8" (1.727 m), weight 86.456 kg (190 lb 9.6 oz), SpO2 97 %.  PHYSICAL EXAMINATION:  GENERAL:  70 y.o.-year-old patient lying in the bed with no acute distress. Morbidly obese EYES: Pupils equal, round, reactive to light and accommodation. No scleral icterus. Extraocular muscles intact.  HEENT: Head atraumatic, normocephalic. Oropharynx and nasopharynx clear.  NECK:  Supple, no jugular venous distention. No thyroid enlargement, no tenderness.  LUNGS: Dimimished breath sounds bilaterally, no wheezing or rhonchi.  CARDIOVASCULAR: S1, S2 normal. No murmurs, rubs, or gallops.  ABDOMEN: Soft, nontender, nondistended. Bowel sounds present. No organomegaly or mass.  EXTREMITIES: No pedal edema, cyanosis, or clubbing.   NEUROLOGIC: Cranial nerves II through XII are intact. Muscle strength 5/5 in all extremities. Sensation intact. Gait not checked.  PSYCHIATRIC: The patient is alert and oriented x 3.  SKIN: No obvious rash, lesion, or ulcer.    LABORATORY PANEL:   CBC  Recent Labs Lab 01/05/16 1015 01/07/16 0612  WBC 10.1  --   HGB 11.8* 11.5*  HCT 34.7*  --   PLT 159  --    ------------------------------------------------------------------------------------------------------------------  Chemistries   Recent Labs Lab 01/04/16 1633  01/07/16 0612  NA 130*  < > 132*  K 3.8  < > 3.8  CL 95*  < > 95*  CO2 23  < > 26  GLUCOSE 212*  < > 203*  BUN 22*  < > 42*  CREATININE 1.44*  < > 1.41*  CALCIUM 8.2*  < > 8.4*  MG  --   < > 2.7*  AST 20  --   --   ALT 15*  --   --   ALKPHOS 25*  --   --   BILITOT 1.6*  --   --   < > = values in this interval not displayed. ------------------------------------------------------------------------------------------------------------------  Cardiac Enzymes  Recent Labs Lab 01/04/16 1633  TROPONINI <0.03   ------------------------------------------------------------------------------------------------------------------  RADIOLOGY:  No results found.  EKG:   Orders placed or performed during the hospital encounter of 01/04/16  . ED EKG  . ED EKG  . EKG    ASSESSMENT AND PLAN:   70 year old male with past medical history of COPD, atrial fibrillation, hypertension, hyperlipidemia, history of BPH who presents to the hospital due to fevers chills, shortness of breath and cough and noted to have  pneumonia.  1. Right upper lobe pneumonia with COPD exacerbation and acute hypoxic respiratory failure -IV steroids, Antibiotics - Scheduled Nebulizers - Inhalers -Wean O2 as tolerated - Changed to prednisone Still has Some hemoptysis. We'll continue to monitor over night. Hopefully can wean off oxygen. If not will need home oxygen at discharge  tomorrow.   2. Acute kidney injury-secondary to dehydration. Improved with IV fluids, discontinued NS iv. resumed Lasix, lisinopril.  3. History of atrial fibrillation-currently rate controlled. -Continue metoprolol, hold Pradaxa.  4. Hyperlipidemia-continue atorvastatin.  5. BPH-continue Flomax.  7. Insulin-dependent diabetes mellitus. Continue Lantus and sliding scale insulin.  All the records are reviewed and case discussed with Care Management/Social Workerr. Management plans discussed with the patient, his wife and they are in agreement.  CODE STATUS: Full code.  TOTAL TIME TAKING CARE OF THIS PATIENT: 30 minutes.   Discharge tomorrow with oxygen   Hillary Bow R M.D on 01/08/2016 at 1:21 PM  Between 7am to 6pm - Pager - [phone]  After 6pm go to [email or web address] - password [email or web address] [facility]  Office  [phone]  CC: Primary care physician; Default, Provider, MD

## 2016-01-08 NOTE — Care Management Important Message (Signed)
Important Message  Patient Details  Name: Jay Morris MRN: [id number] Date of Birth: 1945-08-06   Medicare Important Message Given:  Yes    Juliann Pulse A Hasini Peachey 01/08/2016, 2:56 PM

## 2016-01-09 LAB — CULTURE, BLOOD (ROUTINE X 2)
Culture: NO GROWTH
Culture: NO GROWTH

## 2016-01-09 LAB — GLUCOSE, CAPILLARY
GLUCOSE-CAPILLARY: 118 mg/dL — AB (ref 65–99)
Glucose-Capillary: 176 mg/dL — ABNORMAL HIGH (ref 65–99)
Glucose-Capillary: 196 mg/dL — ABNORMAL HIGH (ref 65–99)

## 2016-01-09 MED ORDER — LACTULOSE 10 GM/15ML PO SOLN
30.0000 g | Freq: Once | ORAL | Status: DC
  Filled 2016-01-09: qty 60

## 2016-01-09 MED ORDER — LEVOFLOXACIN 750 MG PO TABS: 750.0000 mg | ORAL_TABLET | ORAL | Status: DC

## 2016-01-09 MED ORDER — TRAMADOL HCL 50 MG PO TABS: 50.0000 mg | ORAL_TABLET | Freq: Four times a day (QID) | ORAL | Status: DC | PRN

## 2016-01-09 NOTE — Consult Note (Signed)
Pharmacy Antibiotic Note  Jay Morris is a 70 y.o. male admitted on 01/04/2016 with pneumonia.  Pharmacy has been consulted for levofloxacin dosing.  Plan: Continue levaquin 749m PO Q24H. Day 6 of antibiotics. Discussed with MD, anticipates treating x 7 days.   Height: _0  (172.7 cm) Weight: 190 lb 9.6 oz (86.456 kg) IBW/kg (Calculated) : 68.4  Temp (24hrs), Avg:97.8 F (36.6 C), Min:97.6 F (36.4 C), Max:98.1 F (36.7 C)   Recent Labs Lab 01/04/16 1633 01/04/16 2024 01/05/16 1015 01/07/16 0612  WBC 11.8*  --  10.1  --   CREATININE 1.44*  --  1.25* 1.41*  LATICACIDVEN 1.5 1.7  --   --     Estimated Creatinine Clearance: 50.6 mL/min (by C-G formula based on Cr of 1.41).    Allergies  Allergen Reactions  . Shellfish-Derived Products Anaphylaxis    Antimicrobials this admission: levofloxacin 6/18 >>    Dose adjustments this admission: 6/19 Levaquin changed from [id number] to q24h  Microbiology results: 6/18 BCx: NGTD 6/19 UCx: 1k CFU, insignificant growth   Thank you for allowing pharmacy to be a part of this patient's care.  Sherrill Buikema C 01/09/2016 10:29 AM

## 2016-01-09 NOTE — Progress Notes (Signed)
Pt ambulated around nurses station times 1 on roomair, sats 86% and HR 104/min, respiratory rate 26/min. Pt taken back to room, sats at rest 89% sitting on bed. Placed back on O2 at 2lpm [place], sats 95% respiratory rate 18/min HR 86/min

## 2016-01-09 NOTE — Progress Notes (Signed)
DISCHARGE NOTE:  Pt given discharge intrusctions and prescriptions. Pt verbalized understanding. Pt wheeled to car by staff.

## 2016-01-09 NOTE — Care Management (Signed)
New O2 sat assessments needed for today.

## 2016-01-09 NOTE — Progress Notes (Signed)
SATURATION QUALIFICATIONS:   Patient Saturations on Room Air at Rest = 93 %  Patient Saturations on Room Air while Ambulating = 86 %  Patient Saturations on 2 Liters of oxygen while Ambulating =94 %  Please briefly explain why patient needs home oxygen: COPD

## 2016-01-09 NOTE — Care Management (Signed)
[facility] to be delivered to this patient today prior to discharge. Per wife patient has follow up appointment with DR. Sparks scheduled. No further RNCM needs.

## 2016-01-09 NOTE — Progress Notes (Signed)
Inpatient Diabetes Program Recommendations  AACE/ADA: New Consensus Statement on Inpatient Glycemic Control (2015)  Target Ranges:  Prepandial:   less than 140 mg/dL      Peak postprandial:   less than 180 mg/dL (1-2 hours)      Critically ill patients:  140 - 180 mg/dL   Lab Results  Component Value Date   GLUCAP 176* 01/09/2016   HGBA1C 8.0* 01/07/2016    Review of Glycemic Control  Results for Jay Morris, Jay Morris (MRN [id number]) as of 01/09/2016 11:11  Ref. Range 01/08/2016 12:38 01/08/2016 16:57 01/08/2016 21:25 01/09/2016 08:17 01/09/2016 09:48  Glucose-Capillary Latest Ref Range: 65-99 mg/dL 184 (H) 346 (H) 304 (H) 118 (H) 176 (H)    Diabetes history: DM2 Outpatient Diabetes medications: Lantus 70 units BID with meals, Metformin 1000 mg BID Current orders for Inpatient glycemic control: Lantus 70 units BID, Novolog 0-15 units TID with meals, Novolog 0-5 units QHS, Novolog 10 units TID with meals for meal coverage   Inpatient Diabetes Program Recommendations: Elevated CBG related to steroids- do not recommend any changes to current diabetes medications.  If steroids are decreased, please taper Novolog insulin at meals.  Gentry Fitz, RN, BA, MHA, CDE Diabetes Coordinator Inpatient Diabetes Program  [phone] (Team Pager) [phone] ([place]) 01/09/2016 11:12 AM

## 2016-01-09 NOTE — Discharge Instructions (Signed)
DIET:  Cardiac diet  DISCHARGE CONDITION:  Stable  ACTIVITY:  Activity as tolerated  OXYGEN:  Home Oxygen: Yes.     Oxygen Delivery: 2 liters/min via Patient connected to nasal cannula oxygen  DISCHARGE LOCATION:  home   If you experience worsening of your admission symptoms, develop shortness of breath, life threatening emergency, suicidal or homicidal thoughts you must seek medical attention immediately by calling 911 or calling your MD immediately  if symptoms less severe.  You Must read complete instructions/literature along with all the possible adverse reactions/side effects for all the Medicines you take and that have been prescribed to you. Take any new Medicines after you have completely understood and accpet all the possible adverse reactions/side effects.   Please note  You were cared for by a hospitalist during your hospital stay. If you have any questions about your discharge medications or the care you received while you were in the hospital after you are discharged, you can call the unit and asked to speak with the hospitalist on call if the hospitalist that took care of you is not available. Once you are discharged, your primary care physician will handle any further medical issues. Please note that NO REFILLS for any discharge medications will be authorized once you are discharged, as it is imperative that you return to your primary care physician (or establish a relationship with a primary care physician if you do not have one) for your aftercare needs so that they can reassess your need for medications and monitor your lab values.

## 2016-01-12 NOTE — Discharge Summary (Signed)
[place] at [place] NAME: Jay Morris    MR#:  [id number]  DATE OF BIRTH:  1945/08/19  DATE OF ADMISSION:  01/04/2016 ADMITTING PHYSICIAN: Henreitta Leber, MD  DATE OF DISCHARGE: 01/09/2016  4:07 PM  PRIMARY CARE PHYSICIAN: Default, Provider, MD   ADMISSION DIAGNOSIS:  Substance abuse [F19.10] Vomiting [R11.10] Fever [R50.9] Aspiration pneumonia of right upper lobe, unspecified aspiration pneumonia type ([place]) [J69.0]  DISCHARGE DIAGNOSIS:  Active Problems:   Pneumonia   SECONDARY DIAGNOSIS:   Past Medical History  Diagnosis Date  . COPD (chronic obstructive pulmonary disease) ([place])   . Diabetes mellitus without complication ([place])   . Hypertension   . BPH (benign prostatic hyperplasia)   . Atrial fibrillation ([place])   . Hyperlipidemia      ADMITTING HISTORY  Jay Morris is a 70 y.o. male with a known history of COPD, diabetes, hypertension, BPH, atrial fibrillation, hyperlipidemia presents to the hospital due to fevers chills cough and shortness of breath progressively getting worse over the past 3-4 days. Patient says that he has had a cough productive with clear sputum now for the past few days associated with significant shortness of breath and exertion. He also admits to some chills but no documented fever. He also has had some intermittent vomiting associated with his shortness of breath which has been bilious and nonbloody in nature. He complains of some mild chest pain and tightness upon exertion. He presented to the ER with the above complaints and was noted to be in acute respiratory failure secondary to pneumonia and hospitalist services were contacted for further treatment and evaluation. Patient denies any recent sick contacts or being admitted to the hospital for any other causes recently.  HOSPITAL COURSE:   70 year old male with past medical history of COPD, atrial fibrillation, hypertension,  hyperlipidemia, history of BPH who presents to the hospital due to fevers chills, shortness of breath and cough and noted to have pneumonia.  1. Right upper lobe pneumonia with COPD exacerbation and acute hypoxic respiratory failure -IV steroids, Antibiotics - Scheduled Nebulizers - Inhalers -Wean O2 as tolerated - Changed to prednisone and stopped on day of discharge Patient will be discharged home on Levaquin.  2. Acute kidney injury-secondary to dehydration. Improved with IV fluids, discontinued NS iv. resumed Lasix, lisinopril.  3. History of atrial fibrillation-currently rate controlled. -Continue metoprolo On Pradaxa  4. Hyperlipidemia-continue atorvastatin.  5. BPH-continue Flomax.  7. Insulin-dependent diabetes mellitus. Continue Lantus and sliding scale insulin. Patient did have uncontrolled blood sugars in the hospital due to steroids. Steroids stopped at discharge.  Stable for discharge home with home oxygen. Follow up with PCP in 1 week.  CONSULTS OBTAINED:     DRUG ALLERGIES:   Allergies  Allergen Reactions  . Shellfish-Derived Products Anaphylaxis    DISCHARGE MEDICATIONS:   Discharge Medication List as of 01/09/2016 12:15 PM    START taking these medications   Details  levofloxacin (LEVAQUIN) 750 MG tablet Take 1 tablet (750 mg total) by mouth daily., Starting 01/09/2016, Until Discontinued, Normal    traMADol (ULTRAM) 50 MG tablet Take 1 tablet (50 mg total) by mouth every 6 (six) hours as needed for moderate pain., Starting 01/09/2016, Until Discontinued, Print      CONTINUE these medications which have NOT CHANGED   Details  atorvastatin (LIPITOR) 20 MG tablet Take 20 mg by mouth at bedtime., Starting 12/09/2015, Until Discontinued, Historical Med    BREO ELLIPTA 100-25 MCG/INH AEPB Inhale  1 puff into the lungs daily., Starting 12/04/2015, Until Discontinued, Historical Med    furosemide (LASIX) 20 MG tablet Take 20 mg by mouth daily., Starting  12/29/2015, Until Discontinued, Historical Med    LANTUS 100 UNIT/ML injection Inject 70 Units into the skin 2 (two) times daily., Starting 12/29/2015, Until Discontinued, Historical Med    lisinopril (PRINIVIL,ZESTRIL) 40 MG tablet Take 40 mg by mouth at bedtime. , Starting 12/29/2015, Until Discontinued, Historical Med    metFORMIN (GLUCOPHAGE) 1000 MG tablet Take 1,000 mg by mouth 2 (two) times daily., Starting 12/17/2015, Until Discontinued, Historical Med    methocarbamol (ROBAXIN) 500 MG tablet Take 500 mg by mouth 2 (two) times daily., Starting 12/31/2015, Until Discontinued, Historical Med    metoprolol tartrate (LOPRESSOR) 25 MG tablet Take 25 mg by mouth 2 (two) times daily., Starting 12/04/2015, Until Discontinued, Historical Med    omega-3 acid ethyl esters (LOVAZA) 1 g capsule Take 4 g by mouth daily., Starting 12/29/2015, Until Discontinued, Historical Med    PRADAXA 150 MG CAPS capsule Take 150 mg by mouth 2 (two) times daily., Starting 12/11/2015, Until Discontinued, Historical Med    tamsulosin (FLOMAX) 0.4 MG CAPS capsule Take 0.4 mg by mouth every evening. , Starting 12/17/2015, Until Discontinued, Historical Med        Today   VITAL SIGNS:  Blood pressure 139/71, pulse 74, temperature 97.7 F (36.5 C), temperature source Oral, resp. rate 18, height 5' 8" (1.727 m), weight 86.456 kg (190 lb 9.6 oz), SpO2 95 %.  I/O:  No intake or output data in the 24 hours ending 01/12/16 1237  PHYSICAL EXAMINATION:  Physical Exam  GENERAL:  70 y.o.-year-old patient lying in the bed with no acute distress.  LUNGS: Normal breath sounds bilaterally, no wheezing, rales,rhonchi or crepitation. No use of accessory muscles of respiration.  CARDIOVASCULAR: S1, S2 normal. No murmurs, rubs, or gallops.  ABDOMEN: Soft, non-tender, non-distended. Bowel sounds present. No organomegaly or mass.  NEUROLOGIC: Moves all 4 extremities. PSYCHIATRIC: The patient is alert and oriented x 3.  SKIN: No  obvious rash, lesion, or ulcer.   DATA REVIEW:   CBC  Recent Labs Lab 01/07/16 0612  HGB 11.5*    Chemistries   Recent Labs Lab 01/07/16 0612  NA 132*  K 3.8  CL 95*  CO2 26  GLUCOSE 203*  BUN 42*  CREATININE 1.41*  CALCIUM 8.4*  MG 2.7*    Cardiac Enzymes No results for input(s): TROPONINI in the last 168 hours.  Microbiology Results  Results for orders placed or performed during the hospital encounter of 01/04/16  Blood culture (routine x 2)     Status: None   Collection Time: 01/04/16  4:28 PM  Result Value Ref Range Status   Specimen Description BLOOD LEFT HAND  Final   Special Requests BOTTLES DRAWN AEROBIC AND ANAEROBIC  5CC  Final   Culture NO GROWTH 5 DAYS  Final   Report Status 01/09/2016 FINAL  Final  Blood culture (routine x 2)     Status: None   Collection Time: 01/04/16  4:33 PM  Result Value Ref Range Status   Specimen Description BLOOD RIGHT ANTECUBITAL  Final   Special Requests BOTTLES DRAWN AEROBIC AND ANAEROBIC  5CC  Final   Culture NO GROWTH 5 DAYS  Final   Report Status 01/09/2016 FINAL  Final  Urine culture     Status: Abnormal   Collection Time: 01/05/16  4:08 AM  Result Value Ref Range Status  Specimen Description URINE, RANDOM  Final   Special Requests NONE  Final   Culture (A)  Final    1,000 COLONIES/mL INSIGNIFICANT GROWTH Performed at [facility]    Report Status 01/06/2016 FINAL  Final    RADIOLOGY:  No results found.  Follow up with PCP in 1 week.  Management plans discussed with the patient, family and they are in agreement.  CODE STATUS:  Code Status History    Date Active Date Inactive Code Status Order ID Comments User Context   01/04/2016  8:05 PM 01/09/2016  7:08 PM Full Code [id number]  Henreitta Leber, MD Inpatient      TOTAL TIME TAKING CARE OF THIS PATIENT ON DAY OF DISCHARGE: more than 30 minutes.   Hillary Bow R M.D on 01/12/2016 at 12:37 PM  Between 7am to 6pm - Pager -  [phone]  After 6pm go to [email or web address] - password [email or web address]  [facility]  Office  [phone]  CC: Primary care physician; Default, Provider, MD  Note: This dictation was prepared with Dragon dictation along with smaller phrase technology. Any transcriptional errors that result from this process are unintentional.

## 2016-02-09 DIAGNOSIS — J449 Chronic obstructive pulmonary disease, unspecified: Secondary | ICD-10-CM | POA: Insufficient documentation

## 2016-04-01 DIAGNOSIS — I272 Pulmonary hypertension, unspecified: Secondary | ICD-10-CM

## 2016-04-01 HISTORY — DX: Pulmonary hypertension, unspecified: I27.20

## 2016-04-16 DIAGNOSIS — M1A00X0 Idiopathic chronic gout, unspecified site, without tophus (tophi): Secondary | ICD-10-CM | POA: Insufficient documentation

## 2016-04-16 DIAGNOSIS — M1A9XX0 Chronic gout, unspecified, without tophus (tophi): Secondary | ICD-10-CM | POA: Insufficient documentation

## 2016-11-24 DIAGNOSIS — E1165 Type 2 diabetes mellitus with hyperglycemia: Secondary | ICD-10-CM | POA: Insufficient documentation

## 2016-11-24 DIAGNOSIS — Z794 Long term (current) use of insulin: Secondary | ICD-10-CM | POA: Insufficient documentation

## 2017-04-28 ENCOUNTER — Other Ambulatory Visit: Payer: Self-pay | Admitting: Internal Medicine

## 2017-04-28 DIAGNOSIS — R42 Dizziness and giddiness: Secondary | ICD-10-CM

## 2017-05-05 ENCOUNTER — Ambulatory Visit
Admission: RE | Admit: 2017-05-05 | Discharge: 2017-05-05 | Disposition: A | Discharge status: Home / Self Care | Payer: Medicare Other | Source: Ambulatory Visit | Attending: Internal Medicine | Admitting: Internal Medicine

## 2017-05-05 DIAGNOSIS — I6523 Occlusion and stenosis of bilateral carotid arteries: Secondary | ICD-10-CM | POA: Insufficient documentation

## 2017-05-05 DIAGNOSIS — R42 Dizziness and giddiness: Secondary | ICD-10-CM | POA: Diagnosis not present

## 2017-06-02 ENCOUNTER — Ambulatory Visit: Payer: Medicare Other | Admitting: Anesthesiology

## 2017-06-02 ENCOUNTER — Encounter: Payer: Self-pay | Admitting: *Deleted

## 2017-06-02 ENCOUNTER — Ambulatory Visit
Admission: RE | Admit: 2017-06-02 | Discharge: 2017-06-02 | Disposition: A | Discharge status: Home / Self Care | Payer: Medicare Other | Source: Ambulatory Visit | Attending: Internal Medicine | Admitting: Internal Medicine

## 2017-06-02 ENCOUNTER — Encounter
Admission: RE | Disposition: A | Discharge status: Home / Self Care | Payer: Self-pay | Source: Ambulatory Visit | Attending: Internal Medicine

## 2017-06-02 DIAGNOSIS — J449 Chronic obstructive pulmonary disease, unspecified: Secondary | ICD-10-CM | POA: Diagnosis not present

## 2017-06-02 DIAGNOSIS — E119 Type 2 diabetes mellitus without complications: Secondary | ICD-10-CM | POA: Diagnosis not present

## 2017-06-02 DIAGNOSIS — Z794 Long term (current) use of insulin: Secondary | ICD-10-CM | POA: Diagnosis not present

## 2017-06-02 DIAGNOSIS — D500 Iron deficiency anemia secondary to blood loss (chronic): Secondary | ICD-10-CM | POA: Insufficient documentation

## 2017-06-02 DIAGNOSIS — K921 Melena: Secondary | ICD-10-CM | POA: Insufficient documentation

## 2017-06-02 DIAGNOSIS — Z7901 Long term (current) use of anticoagulants: Secondary | ICD-10-CM | POA: Diagnosis not present

## 2017-06-02 DIAGNOSIS — Z91013 Allergy to seafood: Secondary | ICD-10-CM | POA: Diagnosis not present

## 2017-06-02 DIAGNOSIS — E785 Hyperlipidemia, unspecified: Secondary | ICD-10-CM | POA: Insufficient documentation

## 2017-06-02 DIAGNOSIS — N400 Enlarged prostate without lower urinary tract symptoms: Secondary | ICD-10-CM | POA: Diagnosis not present

## 2017-06-02 DIAGNOSIS — Z87891 Personal history of nicotine dependence: Secondary | ICD-10-CM | POA: Diagnosis not present

## 2017-06-02 DIAGNOSIS — Z79899 Other long term (current) drug therapy: Secondary | ICD-10-CM | POA: Insufficient documentation

## 2017-06-02 DIAGNOSIS — K2950 Unspecified chronic gastritis without bleeding: Secondary | ICD-10-CM | POA: Insufficient documentation

## 2017-06-02 DIAGNOSIS — I4891 Unspecified atrial fibrillation: Secondary | ICD-10-CM | POA: Insufficient documentation

## 2017-06-02 DIAGNOSIS — I10 Essential (primary) hypertension: Secondary | ICD-10-CM | POA: Insufficient documentation

## 2017-06-02 HISTORY — PX: ESOPHAGOGASTRODUODENOSCOPY (EGD) WITH PROPOFOL: SHX5813

## 2017-06-02 LAB — GLUCOSE, CAPILLARY: GLUCOSE-CAPILLARY: 105 mg/dL — AB (ref 65–99)

## 2017-06-02 SURGERY — ESOPHAGOGASTRODUODENOSCOPY (EGD) WITH PROPOFOL
Anesthesia: General

## 2017-06-02 MED ORDER — FENTANYL CITRATE (PF) 100 MCG/2ML IJ SOLN
INTRAMUSCULAR | Status: AC
  Filled 2017-06-02: qty 2

## 2017-06-02 MED ORDER — FENTANYL CITRATE (PF) 100 MCG/2ML IJ SOLN
INTRAMUSCULAR | Status: DC | PRN
  Administered 2017-06-02: 50 ug via INTRAVENOUS

## 2017-06-02 MED ORDER — PHENYLEPHRINE HCL 10 MG/ML IJ SOLN
INTRAMUSCULAR | Status: DC | PRN
  Administered 2017-06-02: 100 ug via INTRAVENOUS

## 2017-06-02 MED ORDER — LIDOCAINE 2% (20 MG/ML) 5 ML SYRINGE
INTRAMUSCULAR | Status: DC | PRN
  Administered 2017-06-02: 40 mg via INTRAVENOUS

## 2017-06-02 MED ORDER — PROPOFOL 10 MG/ML IV BOLUS
INTRAVENOUS | Status: DC | PRN
  Administered 2017-06-02: 100 mg via INTRAVENOUS

## 2017-06-02 MED ORDER — PROPOFOL 500 MG/50ML IV EMUL
INTRAVENOUS | Status: AC
  Filled 2017-06-02: qty 50

## 2017-06-02 MED ORDER — PROPOFOL 500 MG/50ML IV EMUL
INTRAVENOUS | Status: DC | PRN
  Administered 2017-06-02: 140 ug/kg/min via INTRAVENOUS

## 2017-06-02 MED ORDER — SODIUM CHLORIDE 0.9 % IV SOLN
INTRAVENOUS | Status: DC
  Administered 2017-06-02: 13:00:00 via INTRAVENOUS

## 2017-06-02 NOTE — Interval H&P Note (Signed)
History and Physical Interval Note:  06/02/2017 1:13 PM  Jay Morris  has presented today for surgery, with the diagnosis of MELENA  The various methods of treatment have been discussed with the patient and family. After consideration of risks, benefits and other options for treatment, the patient has consented to  Procedure(s): ESOPHAGOGASTRODUODENOSCOPY (EGD) WITH PROPOFOL (N/A) as a surgical intervention .  The patient's history has been reviewed, patient examined, no change in status, stable for surgery.  I have reviewed the patient's chart and labs.  Questions were answered to the patient's satisfaction.     [place], Soledad

## 2017-06-02 NOTE — H&P (Signed)
Outpatient short stay form Pre-procedure 06/02/2017 1:12 PM Jay K. Alice Reichert, M.D.  Primary Physician: Fulton Reek, M.D.  Reason for visit:  Melena, anemia  History of present illness:  Pt with intermittent melena over past 2 months. No abdominal pain. Hgb preop around 11.     Current Facility-Administered Medications:  .  0.9 %  sodium chloride infusion, , Intravenous, Continuous, [place], Benay Pike, MD, Last Rate: 20 mL/hr at 06/02/17 1251  Medications Prior to Admission  Medication Sig Dispense Refill Last Dose  . atorvastatin (LIPITOR) 20 MG tablet Take 20 mg by mouth at bedtime.   06/01/2017 at Unknown time  . BREO ELLIPTA 100-25 MCG/INH AEPB Inhale 1 puff into the lungs daily.   06/02/2017 at Unknown time  . carvedilol (COREG) 12.5 MG tablet Take 12.5 mg 2 (two) times daily with a meal by mouth.   06/02/2017 at Unknown time  . furosemide (LASIX) 20 MG tablet Take 20 mg by mouth daily.   06/02/2017 at Unknown time  . insulin aspart (NOVOLOG) 100 UNIT/ML injection Inject 3 (three) times daily before meals into the skin.   06/01/2017 at Unknown time  . insulin degludec (TRESIBA FLEXTOUCH) 100 UNIT/ML SOPN FlexTouch Pen Inject daily at 10 pm into the skin.     Marland Kitchen lisinopril (PRINIVIL,ZESTRIL) 40 MG tablet Take 40 mg by mouth at bedtime.    06/01/2017 at Unknown time  . metFORMIN (GLUCOPHAGE) 1000 MG tablet Take 1,000 mg by mouth 2 (two) times daily.   06/01/2017 at Unknown time  . omega-3 acid ethyl esters (LOVAZA) 1 g capsule Take 4 g by mouth daily.   06/01/2017 at Unknown time  . PRADAXA 150 MG CAPS capsule Take 150 mg by mouth 2 (two) times daily.   06/01/2017 at Unknown time  . spironolactone (ALDACTONE) 25 MG tablet Take 25 mg daily by mouth.     . tamsulosin (FLOMAX) 0.4 MG CAPS capsule Take 0.4 mg by mouth every evening.    06/01/2017 at Unknown time  . LANTUS 100 UNIT/ML injection Inject 70 Units into the skin 2 (two) times daily.   01/04/2016 at Unknown time  . levofloxacin  (LEVAQUIN) 750 MG tablet Take 1 tablet (750 mg total) by mouth daily. 3 tablet 0   . traMADol (ULTRAM) 50 MG tablet Take 1 tablet (50 mg total) by mouth every 6 (six) hours as needed for moderate pain. (Patient not taking: Reported on 06/02/2017) 30 tablet 0 Not Taking at Unknown time     Allergies  Allergen Reactions  . Shellfish-Derived Products Anaphylaxis     Past Medical History:  Diagnosis Date  . Atrial fibrillation ([place])   . BPH (benign prostatic hyperplasia)   . COPD (chronic obstructive pulmonary disease) ([place])   . Diabetes mellitus without complication ([place])   . Hyperlipidemia   . Hypertension     Review of systems:      Physical Exam  General appearance: alert, cooperative and appears stated age Resp: clear to auscultation bilaterally Cardio: regular rate and rhythm, S1, S2 normal, no murmur, click, rub or gallop GI: soft, non-tender; bowel sounds normal; no masses,  no organomegaly     Planned procedures: EGD. The patient understands the nature of the planned procedure, indications, risks, alternatives and potential complications including but not limited to bleeding, infection, perforation, damage to internal organs and possible oversedation/side effects from anesthesia. The patient agrees and gives consent to proceed.  Please refer to procedure notes for findings, recommendations and patient disposition/instructions.  Jay K. Alice Reichert, M.D. Gastroenterology 06/02/2017  1:12 PM

## 2017-06-02 NOTE — Transfer of Care (Signed)
Immediate Anesthesia Transfer of Care Note  Patient: Jay Morris  Procedure(s) Performed: ESOPHAGOGASTRODUODENOSCOPY (EGD) WITH PROPOFOL (N/A )  Patient Location: PACU and Endoscopy Unit  Anesthesia Type:General  Level of Consciousness: sedated  Airway & Oxygen Therapy: Patient Spontanous Breathing and Patient connected to nasal cannula oxygen  Post-op Assessment: Report given to RN and Post -op Vital signs reviewed and stable  Post vital signs: Reviewed and stable  Last Vitals:  Vitals:   06/02/17 1232  BP: (!) 166/61  Pulse: (!) 52  Resp: 18  Temp: (!) 36.3 C  SpO2: 100%    Last Pain:  Vitals:   06/02/17 1232  TempSrc: Tympanic         Complications: No apparent anesthesia complications

## 2017-06-02 NOTE — Op Note (Signed)
[facility] Gastroenterology Patient Name: Jay Morris Procedure Date: 06/02/2017 1:13 PM MRN: [id number] Account #: [id number] Date of Birth: 1946-04-07 Admit Type: Outpatient Age: 71 Room: [facility] ENDO ROOM 1 Gender: Male Note Status: Finalized Procedure:            Upper GI endoscopy Indications:          Iron deficiency anemia secondary to chronic blood loss,                        Iron deficiency anemia due to suspected upper                        gastrointestinal bleeding Providers:            Benay Pike. Alice Reichert MD, MD Referring MD:         Leonie Douglas. Doy Hutching, MD (Referring MD) Medicines:            Propofol per Anesthesia Complications:        No immediate complications. Procedure:            Pre-Anesthesia Assessment:                       - The risks and benefits of the procedure and the                        sedation options and risks were discussed with the                        patient. All questions were answered and informed                        consent was obtained.                       - Patient identification and proposed procedure were                        verified prior to the procedure by the nurse. The                        procedure was verified in the procedure room.                       - The risks and benefits of the procedure and the                        sedation options and risks were discussed with the                        patient. All questions were answered and informed                        consent was obtained.                       - Patient identification and proposed procedure were                        verified prior to the procedure by the nurse. The  procedure was verified in the procedure room.                       - ASA Grade Assessment: II - A patient with mild                        systemic disease.                       - After reviewing the risks and benefits, the patient                 was deemed in satisfactory condition to undergo the                        procedure.                       After obtaining informed consent, the endoscope was                        passed under direct vision. Throughout the procedure,                        the patient's blood pressure, pulse, and oxygen                        saturations were monitored continuously. The Endoscope                        was introduced through the mouth, and advanced to the                        third part of duodenum. The upper GI endoscopy was                        accomplished without difficulty. The patient tolerated                        the procedure well. Findings:      The examined esophagus was normal.      Diffuse moderate inflammation characterized by congestion (edema),       erosions and erythema was found in the gastric antrum. Biopsies were       taken with a cold forceps for Helicobacter pylori testing.      The examined duodenum was normal. Impression:           - Normal esophagus.                       - Chronic gastritis. Biopsied.                       - Normal examined duodenum. Recommendation:       - Await pathology results.                       - Patient has a contact number available for                        emergencies. The signs and symptoms of potential  delayed complications were discussed with the patient.                        Return to normal activities tomorrow. Written discharge                        instructions were provided to the patient.                       - Resume previous diet.                       - Continue present medications.                       - Plan colonoscopy in the near future                       - The findings and recommendations were discussed with                        the patient and their spouse. Procedure Code(s):    --- Professional ---                       [phone], Esophagogastroduodenoscopy,  flexible, transoral;                        with biopsy, single or multiple Diagnosis Code(s):    --- Professional ---                       K29.50, Unspecified chronic gastritis without bleeding                       D50.0, Iron deficiency anemia secondary to blood loss                        (chronic)                       D50.9, Iron deficiency anemia, unspecified CPT copyright 2016 American Medical Association. All rights reserved. The codes documented in this report are preliminary and upon coder review may  be revised to meet current compliance requirements. Efrain Sella MD, MD 06/02/2017 1:28:06 PM This report has been signed electronically. Number of Addenda: 0 Note Initiated On: 06/02/2017 1:13 PM      [facility]

## 2017-06-02 NOTE — H&P (View-Only) (Signed)
Outpatient short stay form Pre-procedure 06/02/2017 1:12 PM Jay Morris, M.D.  Primary Physician: Fulton Reek, M.D.  Reason for visit:  Melena, anemia  History of present illness:  Pt with intermittent melena over past 2 months. No abdominal pain. Hgb preop around 11.     Current Facility-Administered Medications:  .  0.9 %  sodium chloride infusion, , Intravenous, Continuous, [place], Benay Pike, MD, Last Rate: 20 mL/hr at 06/02/17 1251  Medications Prior to Admission  Medication Sig Dispense Refill Last Dose  . atorvastatin (LIPITOR) 20 MG tablet Take 20 mg by mouth at bedtime.   06/01/2017 at Unknown time  . BREO ELLIPTA 100-25 MCG/INH AEPB Inhale 1 puff into the lungs daily.   06/02/2017 at Unknown time  . carvedilol (COREG) 12.5 MG tablet Take 12.5 mg 2 (two) times daily with a meal by mouth.   06/02/2017 at Unknown time  . furosemide (LASIX) 20 MG tablet Take 20 mg by mouth daily.   06/02/2017 at Unknown time  . insulin aspart (NOVOLOG) 100 UNIT/ML injection Inject 3 (three) times daily before meals into the skin.   06/01/2017 at Unknown time  . insulin degludec (TRESIBA FLEXTOUCH) 100 UNIT/ML SOPN FlexTouch Pen Inject daily at 10 pm into the skin.     Marland Kitchen lisinopril (PRINIVIL,ZESTRIL) 40 MG tablet Take 40 mg by mouth at bedtime.    06/01/2017 at Unknown time  . metFORMIN (GLUCOPHAGE) 1000 MG tablet Take 1,000 mg by mouth 2 (two) times daily.   06/01/2017 at Unknown time  . omega-3 acid ethyl esters (LOVAZA) 1 g capsule Take 4 g by mouth daily.   06/01/2017 at Unknown time  . PRADAXA 150 MG CAPS capsule Take 150 mg by mouth 2 (two) times daily.   06/01/2017 at Unknown time  . spironolactone (ALDACTONE) 25 MG tablet Take 25 mg daily by mouth.     . tamsulosin (FLOMAX) 0.4 MG CAPS capsule Take 0.4 mg by mouth every evening.    06/01/2017 at Unknown time  . LANTUS 100 UNIT/ML injection Inject 70 Units into the skin 2 (two) times daily.   01/04/2016 at Unknown time  . levofloxacin  (LEVAQUIN) 750 MG tablet Take 1 tablet (750 mg total) by mouth daily. 3 tablet 0   . traMADol (ULTRAM) 50 MG tablet Take 1 tablet (50 mg total) by mouth every 6 (six) hours as needed for moderate pain. (Patient not taking: Reported on 06/02/2017) 30 tablet 0 Not Taking at Unknown time     Allergies  Allergen Reactions  . Shellfish-Derived Products Anaphylaxis     Past Medical History:  Diagnosis Date  . Atrial fibrillation ([place])   . BPH (benign prostatic hyperplasia)   . COPD (chronic obstructive pulmonary disease) ([place])   . Diabetes mellitus without complication ([place])   . Hyperlipidemia   . Hypertension     Review of systems:      Physical Exam  General appearance: alert, cooperative and appears stated age Resp: clear to auscultation bilaterally Cardio: regular rate and rhythm, S1, S2 normal, no murmur, click, rub or gallop GI: soft, non-tender; bowel sounds normal; no masses,  no organomegaly     Planned procedures: EGD. The patient understands the nature of the planned procedure, indications, risks, alternatives and potential complications including but not limited to bleeding, infection, perforation, damage to internal organs and possible oversedation/side effects from anesthesia. The patient agrees and gives consent to proceed.  Please refer to procedure notes for findings, recommendations and patient disposition/instructions.  Jay Morris, M.D. Gastroenterology 06/02/2017  1:12 PM

## 2017-06-02 NOTE — Anesthesia Post-op Follow-up Note (Signed)
Anesthesia QCDR form completed.        

## 2017-06-02 NOTE — Anesthesia Postprocedure Evaluation (Signed)
Anesthesia Post Note  Patient: Jay Morris  Procedure(s) Performed: ESOPHAGOGASTRODUODENOSCOPY (EGD) WITH PROPOFOL (N/A )  Patient location during evaluation: PACU Anesthesia Type: General Level of consciousness: awake and alert and oriented Pain management: pain level controlled Vital Signs Assessment: post-procedure vital signs reviewed and stable Respiratory status: spontaneous breathing Cardiovascular status: blood pressure returned to baseline Anesthetic complications: no     Last Vitals:  Vitals:   06/02/17 1330 06/02/17 1332  BP: (!) 90/45 (!) 82/35  Pulse:  (!) 53  Resp: 16 (!) 9  Temp: (!) 35.8 C (!) 35.8 C  SpO2:  98%    Last Pain:  Vitals:   06/02/17 1330  TempSrc: Tympanic                 [name]

## 2017-06-02 NOTE — Anesthesia Preprocedure Evaluation (Signed)
Anesthesia Evaluation  Patient identified by MRN, date of birth, ID band Patient awake    Reviewed: Allergy & Precautions, NPO status , Patient's Chart, lab work & pertinent test results, reviewed documented beta blocker date and time   Airway Mallampati: II  TM Distance: <3 FB     Dental  (+) Upper Dentures   Pulmonary pneumonia, resolved, COPD, former smoker,    Pulmonary exam normal        Cardiovascular hypertension, Pt. on medications and Pt. on home beta blockers Normal cardiovascular exam+ dysrhythmias Atrial Fibrillation      Neuro/Psych negative neurological ROS  negative psych ROS   GI/Hepatic Neg liver ROS,   Endo/Other  diabetes  Renal/GU negative Renal ROS  negative genitourinary   Musculoskeletal negative musculoskeletal ROS (+)   Abdominal Normal abdominal exam  (+)   Peds negative pediatric ROS (+)  Hematology negative hematology ROS (+)   Anesthesia Other Findings   Reproductive/Obstetrics                             Anesthesia Physical Anesthesia Plan  ASA: III  Anesthesia Plan: General   Post-op Pain Management:    Induction: Intravenous  PONV Risk Score and Plan:   Airway Management Planned: Nasal Cannula  Additional Equipment:   Intra-op Plan:   Post-operative Plan:   Informed Consent: I have reviewed the patients History and Physical, chart, labs and discussed the procedure including the risks, benefits and alternatives for the proposed anesthesia with the patient or authorized representative who has indicated his/her understanding and acceptance.   Dental advisory given  Plan Discussed with: CRNA and Surgeon  Anesthesia Plan Comments:         Anesthesia Quick Evaluation

## 2017-06-03 ENCOUNTER — Encounter: Payer: Self-pay | Admitting: Internal Medicine

## 2017-06-03 LAB — SURGICAL PATHOLOGY

## 2017-06-14 ENCOUNTER — Encounter: Payer: Self-pay | Admitting: *Deleted

## 2017-06-15 ENCOUNTER — Ambulatory Visit: Payer: Medicare Other | Admitting: Certified Registered Nurse Anesthetist

## 2017-06-15 ENCOUNTER — Ambulatory Visit
Admission: RE | Admit: 2017-06-15 | Discharge: 2017-06-15 | Disposition: A | Discharge status: Home / Self Care | Payer: Medicare Other | Source: Ambulatory Visit | Attending: Internal Medicine | Admitting: Internal Medicine

## 2017-06-15 ENCOUNTER — Encounter
Admission: RE | Disposition: A | Discharge status: Home / Self Care | Payer: Self-pay | Source: Ambulatory Visit | Attending: Internal Medicine

## 2017-06-15 ENCOUNTER — Encounter: Payer: Self-pay | Admitting: Certified Registered Nurse Anesthetist

## 2017-06-15 DIAGNOSIS — E119 Type 2 diabetes mellitus without complications: Secondary | ICD-10-CM | POA: Diagnosis not present

## 2017-06-15 DIAGNOSIS — N400 Enlarged prostate without lower urinary tract symptoms: Secondary | ICD-10-CM | POA: Diagnosis not present

## 2017-06-15 DIAGNOSIS — Z794 Long term (current) use of insulin: Secondary | ICD-10-CM | POA: Insufficient documentation

## 2017-06-15 DIAGNOSIS — E785 Hyperlipidemia, unspecified: Secondary | ICD-10-CM | POA: Diagnosis not present

## 2017-06-15 DIAGNOSIS — J449 Chronic obstructive pulmonary disease, unspecified: Secondary | ICD-10-CM | POA: Diagnosis not present

## 2017-06-15 DIAGNOSIS — I10 Essential (primary) hypertension: Secondary | ICD-10-CM | POA: Diagnosis not present

## 2017-06-15 DIAGNOSIS — Z7901 Long term (current) use of anticoagulants: Secondary | ICD-10-CM | POA: Diagnosis not present

## 2017-06-15 DIAGNOSIS — K635 Polyp of colon: Secondary | ICD-10-CM | POA: Insufficient documentation

## 2017-06-15 DIAGNOSIS — I4891 Unspecified atrial fibrillation: Secondary | ICD-10-CM | POA: Insufficient documentation

## 2017-06-15 DIAGNOSIS — D649 Anemia, unspecified: Secondary | ICD-10-CM | POA: Diagnosis not present

## 2017-06-15 DIAGNOSIS — K921 Melena: Secondary | ICD-10-CM | POA: Diagnosis not present

## 2017-06-15 DIAGNOSIS — K640 First degree hemorrhoids: Secondary | ICD-10-CM | POA: Diagnosis not present

## 2017-06-15 DIAGNOSIS — R197 Diarrhea, unspecified: Secondary | ICD-10-CM | POA: Diagnosis present

## 2017-06-15 DIAGNOSIS — Z79899 Other long term (current) drug therapy: Secondary | ICD-10-CM | POA: Insufficient documentation

## 2017-06-15 DIAGNOSIS — Z87891 Personal history of nicotine dependence: Secondary | ICD-10-CM | POA: Insufficient documentation

## 2017-06-15 HISTORY — DX: Unspecified asthma, uncomplicated: J45.909

## 2017-06-15 HISTORY — DX: Pneumonia, unspecified organism: J18.9

## 2017-06-15 HISTORY — DX: Helicobacter pylori (H. pylori) as the cause of diseases classified elsewhere: B96.81

## 2017-06-15 HISTORY — PX: COLONOSCOPY WITH PROPOFOL: SHX5780

## 2017-06-15 HISTORY — DX: Gastritis, unspecified, without bleeding: K29.70

## 2017-06-15 LAB — GLUCOSE, CAPILLARY: GLUCOSE-CAPILLARY: 105 mg/dL — AB (ref 65–99)

## 2017-06-15 SURGERY — COLONOSCOPY WITH PROPOFOL
Anesthesia: General

## 2017-06-15 MED ORDER — PHENYLEPHRINE HCL 10 MG/ML IJ SOLN
INTRAMUSCULAR | Status: DC | PRN
  Administered 2017-06-15 (×2): 100 ug via INTRAVENOUS

## 2017-06-15 MED ORDER — SODIUM CHLORIDE 0.9 % IV SOLN
INTRAVENOUS | Status: DC
  Administered 2017-06-15: 10:00:00 via INTRAVENOUS

## 2017-06-15 MED ORDER — ONDANSETRON HCL 4 MG/2ML IJ SOLN: 4.0000 mg | Freq: Once | INTRAMUSCULAR | Status: DC | PRN

## 2017-06-15 MED ORDER — LIDOCAINE HCL (PF) 2 % IJ SOLN
INTRAMUSCULAR | Status: AC
  Filled 2017-06-15: qty 10

## 2017-06-15 MED ORDER — FENTANYL CITRATE (PF) 100 MCG/2ML IJ SOLN: 25.0000 ug | INTRAMUSCULAR | Status: DC | PRN

## 2017-06-15 MED ORDER — EPHEDRINE SULFATE 50 MG/ML IJ SOLN
INTRAMUSCULAR | Status: DC | PRN
  Administered 2017-06-15: 5 mg via INTRAVENOUS

## 2017-06-15 MED ORDER — LIDOCAINE HCL (CARDIAC) 20 MG/ML IV SOLN
INTRAVENOUS | Status: DC | PRN
  Administered 2017-06-15: 50 mg via INTRAVENOUS

## 2017-06-15 MED ORDER — PROPOFOL 500 MG/50ML IV EMUL
INTRAVENOUS | Status: AC
  Filled 2017-06-15: qty 50

## 2017-06-15 MED ORDER — PROPOFOL 500 MG/50ML IV EMUL
INTRAVENOUS | Status: DC | PRN
  Administered 2017-06-15: 160 ug/kg/min via INTRAVENOUS

## 2017-06-15 MED ORDER — PROPOFOL 10 MG/ML IV BOLUS
INTRAVENOUS | Status: DC | PRN
  Administered 2017-06-15: 30 mg via INTRAVENOUS

## 2017-06-15 NOTE — Anesthesia Procedure Notes (Signed)
Date/Time: 06/15/2017 10:48 AM Performed by: Johnna Acosta, CRNA Pre-anesthesia Checklist: Patient identified, Suction available, Emergency Drugs available, Patient being monitored and Timeout performed Patient Re-evaluated:Patient Re-evaluated prior to induction Oxygen Delivery Method: Nasal cannula

## 2017-06-15 NOTE — Op Note (Signed)
[facility] Gastroenterology Patient Name: Jay Morris Procedure Date: 06/15/2017 10:47 AM MRN: [id number] Account #: [id number] Date of Birth: 1945-09-15 Admit Type: Outpatient Age: 71 Room: [facility] ENDO ROOM 4 Gender: Male Note Status: Finalized Procedure:            Colonoscopy Indications:          Clinically significant diarrhea of unexplained origin,                        Melena Providers:            Benay Pike. Alice Reichert MD, MD Referring MD:         Leonie Douglas. Doy Hutching, MD (Referring MD) Medicines:            Propofol per Anesthesia Complications:        No immediate complications. Procedure:            Pre-Anesthesia Assessment:                       - The risks and benefits of the procedure and the                        sedation options and risks were discussed with the                        patient. All questions were answered and informed                        consent was obtained.                       - Patient identification and proposed procedure were                        verified prior to the procedure by the nurse. The                        procedure was verified in the procedure room.                       - ASA Grade Assessment: II - A patient with mild                        systemic disease.                       - After reviewing the risks and benefits, the patient                        was deemed in satisfactory condition to undergo the                        procedure.                       After obtaining informed consent, the colonoscope was                        passed under direct vision. Throughout the procedure,                        the  patient's blood pressure, pulse, and oxygen                        saturations were monitored continuously. The                        Colonoscope was introduced through the anus and                        advanced to the the cecum, identified by appendiceal                        orifice and  ileocecal valve. The colonoscopy was                        performed without difficulty. The patient tolerated the                        procedure well. The quality of the bowel preparation                        was good. The ileocecal valve, appendiceal orifice, and                        rectum were photographed. Findings:      The perianal and digital rectal examinations were normal.      A 4 mm polyp was found in the transverse colon. The polyp was sessile.       The polyp was removed with a cold snare. Resection and retrieval were       complete.      Normal mucosa was found in the entire colon. Biopsies for histology were       taken with a cold forceps from the ascending colon, transverse colon and       sigmoid colon for evaluation of microscopic colitis.      Non-bleeding internal hemorrhoids were found during retroflexion. The       hemorrhoids were mild and Grade I (internal hemorrhoids that do not       prolapse). Impression:           - One 4 mm polyp in the transverse colon, removed with                        a cold snare. Resected and retrieved.                       - Normal mucosa in the entire examined colon. Biopsied.                       - Non-bleeding internal hemorrhoids. Recommendation:       - Patient has a contact number available for                        emergencies. The signs and symptoms of potential                        delayed complications were discussed with the patient.                        Return to normal activities tomorrow. Written discharge  instructions were provided to the patient.                       - Resume previous diet.                       - Continue present medications.                       - No recommendation at this time regarding repeat                        colonoscopy due to age.                       - Return to GI office PRN.                       - The findings and recommendations were discussed  with                        the patient and their spouse. Procedure Code(s):    --- Professional ---                       [phone], Colonoscopy, flexible; with removal of tumor(s),                        polyp(s), or other lesion(s) by snare technique                       [id number], 47, Colonoscopy, flexible; with biopsy, single                        or multiple Diagnosis Code(s):    --- Professional ---                       D12.3, Benign neoplasm of transverse colon (hepatic                        flexure or splenic flexure)                       K64.0, First degree hemorrhoids                       R19.7, Diarrhea, unspecified                       K92.1, Melena (includes Hematochezia) CPT copyright 2016 American Medical Association. All rights reserved. The codes documented in this report are preliminary and upon coder review may  be revised to meet current compliance requirements. Efrain Sella MD, MD 06/15/2017 11:14:18 AM This report has been signed electronically. Number of Addenda: 0 Note Initiated On: 06/15/2017 10:47 AM Scope Withdrawal Time: 0 hours 8 minutes 58 seconds  Total Procedure Duration: 0 hours 11 minutes 16 seconds       [facility]

## 2017-06-15 NOTE — Anesthesia Post-op Follow-up Note (Signed)
Anesthesia QCDR form completed.        

## 2017-06-15 NOTE — H&P (Signed)
Outpatient short stay form Pre-procedure 06/15/2017 10:42 AM Sanae Willetts K. Alice Reichert, M.D.  Primary Physician: Fulton Reek, M.D.  Reason for visit:  Diarrhea, heme positive stool.  History of present illness: Patient with a history of atrial fibrillation on Pradaxa presents for a recurrent diarrhea and a finding of Hemoccult-positive stool and his primary care PHYSICIAN'S office. Patient says diarrhea has improved spontaneously and he denies any overt bleeding although he had some melenic stool previously.    Current Facility-Administered Medications:  .  0.9 %  sodium chloride infusion, , Intravenous, Continuous, [place], Benay Pike, MD, Last Rate: 20 mL/hr at 06/15/17 1023 .  fentaNYL (SUBLIMAZE) injection 25 mcg, 25 mcg, Intravenous, Q5 min PRN, Alvin Critchley, MD .  ondansetron [facility]) injection 4 mg, 4 mg, Intravenous, Once PRN, Alvin Critchley, MD  Medications Prior to Admission  Medication Sig Dispense Refill Last Dose  . atorvastatin (LIPITOR) 20 MG tablet Take 20 mg by mouth at bedtime.   06/15/2017 at Unknown time  . BREO ELLIPTA 100-25 MCG/INH AEPB Inhale 1 puff into the lungs daily.   06/15/2017 at Unknown time  . carvedilol (COREG) 12.5 MG tablet Take 12.5 mg 2 (two) times daily with a meal by mouth.   06/15/2017 at Unknown time  . furosemide (LASIX) 20 MG tablet Take 20 mg by mouth daily.   06/15/2017 at Unknown time  . insulin aspart (NOVOLOG) 100 UNIT/ML injection Inject 3 (three) times daily before meals into the skin.   06/15/2017 at Unknown time  . insulin degludec (TRESIBA FLEXTOUCH) 100 UNIT/ML SOPN FlexTouch Pen Inject daily at 10 pm into the skin.   06/15/2017 at Unknown time  . LANTUS 100 UNIT/ML injection Inject 70 Units into the skin 2 (two) times daily.   06/15/2017 at Unknown time  . levofloxacin (LEVAQUIN) 750 MG tablet Take 1 tablet (750 mg total) by mouth daily. 3 tablet 0 06/15/2017 at Unknown time  . lisinopril (PRINIVIL,ZESTRIL) 40 MG tablet Take 40 mg by mouth  at bedtime.    06/15/2017 at Unknown time  . metFORMIN (GLUCOPHAGE) 1000 MG tablet Take 1,000 mg by mouth 2 (two) times daily.   Past Week at Unknown time  . omega-3 acid ethyl esters (LOVAZA) 1 g capsule Take 4 g by mouth daily.   Past Week at Unknown time  . PRADAXA 150 MG CAPS capsule Take 150 mg by mouth 2 (two) times daily.   Past Week at Unknown time  . spironolactone (ALDACTONE) 25 MG tablet Take 25 mg daily by mouth.   06/15/2017 at Unknown time  . tamsulosin (FLOMAX) 0.4 MG CAPS capsule Take 0.4 mg by mouth every evening.    06/15/2017 at Unknown time  . traMADol (ULTRAM) 50 MG tablet Take 1 tablet (50 mg total) by mouth every 6 (six) hours as needed for moderate pain. (Patient not taking: Reported on 06/02/2017) 30 tablet 0 Not Taking at Unknown time     Allergies  Allergen Reactions  . Shellfish-Derived Products Anaphylaxis     Past Medical History:  Diagnosis Date  . Asthma   . Atrial fibrillation ([place])   . BPH (benign prostatic hyperplasia)   . COPD (chronic obstructive pulmonary disease) ([place])   . Diabetes mellitus without complication ([place])   . Helicobacter pylori gastritis   . Hyperlipidemia   . Hypertension   . Pneumonia     Review of systems:   Negative.   Physical Exam  General appearance: alert, cooperative and appears stated age Resp: clear to auscultation bilaterally Cardio:  irregularly irregular rhythm GI: soft, non-tender; bowel sounds normal; no masses,  no organomegaly     Planned procedures: Colonoscopy with random colon biopsy. The patient understands the nature of the planned procedure, indications, risks, alternatives and potential complications including but not limited to bleeding, infection, perforation, damage to internal organs and possible oversedation/side effects from anesthesia. The patient agrees and gives consent to proceed.  Please refer to procedure notes for findings, recommendations and patient  disposition/instructions.    Helmuth Recupero K. Alice Reichert, M.D. Gastroenterology 06/15/2017  10:42 AM

## 2017-06-15 NOTE — Interval H&P Note (Signed)
History and Physical Interval Note:  06/15/2017 10:48 AM  Jay Morris  has presented today for surgery, with the diagnosis of MELENA  The various methods of treatment have been discussed with the patient and family. After consideration of risks, benefits and other options for treatment, the patient has consented to  Procedure(s): COLONOSCOPY WITH PROPOFOL (N/A) as a surgical intervention .  The patient's history has been reviewed, patient examined, no change in status, stable for surgery.  I have reviewed the patient's chart and labs.  Questions were answered to the patient's satisfaction.     [place], [place]

## 2017-06-15 NOTE — Anesthesia Postprocedure Evaluation (Signed)
Anesthesia Post Note  Patient: Romelle Reiley  Procedure(s) Performed: COLONOSCOPY WITH PROPOFOL (N/A )  Patient location during evaluation: Endoscopy Anesthesia Type: General Level of consciousness: awake and alert Pain management: pain level controlled Vital Signs Assessment: post-procedure vital signs reviewed and stable Respiratory status: spontaneous breathing, nonlabored ventilation, respiratory function stable and patient connected to nasal cannula oxygen Cardiovascular status: blood pressure returned to baseline and stable Postop Assessment: no apparent nausea or vomiting Anesthetic complications: no     Last Vitals:  Vitals:   06/15/17 1130 06/15/17 1140  BP: (!) 103/49 (!) 117/54  Pulse: (!) 53 (!) 50  Resp: 18 18  Temp:    SpO2: 97% 96%    Last Pain:  Vitals:   06/15/17 1110  TempSrc: Tympanic                 Martha Clan

## 2017-06-15 NOTE — Transfer of Care (Signed)
Immediate Anesthesia Transfer of Care Note  Patient: Jay Morris  Procedure(s) Performed: COLONOSCOPY WITH PROPOFOL (N/A )  Patient Location: PACU  Anesthesia Type:General  Level of Consciousness: sedated  Airway & Oxygen Therapy: Patient Spontanous Breathing and Patient connected to nasal cannula oxygen  Post-op Assessment: Report given to RN and Post -op Vital signs reviewed and stable  Post vital signs: Reviewed and stable  Last Vitals:  Vitals:   06/15/17 0914 06/15/17 1110  BP: (!) 145/63   Pulse: (!) 53 (!) 52  Temp: (!) 36.3 C 36.4 C  SpO2: 99% 97%    Last Pain:  Vitals:   06/15/17 1110  TempSrc: Tympanic         Complications: No apparent anesthesia complications

## 2017-06-15 NOTE — Anesthesia Preprocedure Evaluation (Signed)
Anesthesia Evaluation  Patient identified by MRN, date of birth, ID band Patient awake    Reviewed: Allergy & Precautions, NPO status , Patient's Chart, lab work & pertinent test results, reviewed documented beta blocker date and time   History of Anesthesia Complications Negative for: history of anesthetic complications  Airway Mallampati: II  TM Distance: <3 FB     Dental  (+) Upper Dentures   Pulmonary asthma , pneumonia, resolved, COPD, former smoker,    Pulmonary exam normal        Cardiovascular hypertension, Pt. on medications and Pt. on home beta blockers Normal cardiovascular exam+ dysrhythmias Atrial Fibrillation      Neuro/Psych negative neurological ROS  negative psych ROS   GI/Hepatic Neg liver ROS,   Endo/Other  diabetes  Renal/GU negative Renal ROS  negative genitourinary   Musculoskeletal negative musculoskeletal ROS (+)   Abdominal Normal abdominal exam  (+)   Peds negative pediatric ROS (+)  Hematology negative hematology ROS (+)   Anesthesia Other Findings Past Medical History: No date: Asthma No date: Atrial fibrillation (HCC) No date: BPH (benign prostatic hyperplasia) No date: COPD (chronic obstructive pulmonary disease) (HCC) No date: Diabetes mellitus without complication (HCC) No date: Helicobacter pylori gastritis No date: Hyperlipidemia No date: Hypertension No date: Pneumonia   Reproductive/Obstetrics                             Anesthesia Physical  Anesthesia Plan  ASA: III  Anesthesia Plan: General   Post-op Pain Management:    Induction: Intravenous  PONV Risk Score and Plan: Propofol infusion  Airway Management Planned: Nasal Cannula  Additional Equipment:   Intra-op Plan:   Post-operative Plan:   Informed Consent: I have reviewed the patients History and Physical, chart, labs and discussed the procedure including the risks, benefits  and alternatives for the proposed anesthesia with the patient or authorized representative who has indicated his/her understanding and acceptance.   Dental advisory given  Plan Discussed with: CRNA and Surgeon  Anesthesia Plan Comments:         Anesthesia Quick Evaluation

## 2017-06-16 ENCOUNTER — Encounter: Payer: Self-pay | Admitting: Internal Medicine

## 2017-06-17 LAB — SURGICAL PATHOLOGY

## 2017-11-16 DIAGNOSIS — K922 Gastrointestinal hemorrhage, unspecified: Secondary | ICD-10-CM

## 2017-11-16 DIAGNOSIS — I2510 Atherosclerotic heart disease of native coronary artery without angina pectoris: Secondary | ICD-10-CM | POA: Insufficient documentation

## 2017-11-16 HISTORY — DX: Gastrointestinal hemorrhage, unspecified: K92.2

## 2017-11-21 ENCOUNTER — Emergency Department: Payer: Medicare Other

## 2017-11-21 ENCOUNTER — Other Ambulatory Visit: Payer: Self-pay

## 2017-11-21 ENCOUNTER — Inpatient Hospital Stay
Admission: EM | Admit: 2017-11-21 | Discharge: 2017-11-29 | DRG: 378 | Disposition: A | Discharge status: Home / Self Care | Payer: Medicare Other | Attending: Internal Medicine | Admitting: Internal Medicine

## 2017-11-21 ENCOUNTER — Encounter: Payer: Self-pay | Admitting: Emergency Medicine

## 2017-11-21 DIAGNOSIS — M109 Gout, unspecified: Secondary | ICD-10-CM | POA: Diagnosis present

## 2017-11-21 DIAGNOSIS — I482 Chronic atrial fibrillation: Secondary | ICD-10-CM | POA: Diagnosis present

## 2017-11-21 DIAGNOSIS — J449 Chronic obstructive pulmonary disease, unspecified: Secondary | ICD-10-CM | POA: Diagnosis present

## 2017-11-21 DIAGNOSIS — Z7951 Long term (current) use of inhaled steroids: Secondary | ICD-10-CM

## 2017-11-21 DIAGNOSIS — D500 Iron deficiency anemia secondary to blood loss (chronic): Secondary | ICD-10-CM

## 2017-11-21 DIAGNOSIS — Z7901 Long term (current) use of anticoagulants: Secondary | ICD-10-CM

## 2017-11-21 DIAGNOSIS — N183 Chronic kidney disease, stage 3 (moderate): Secondary | ICD-10-CM | POA: Diagnosis present

## 2017-11-21 DIAGNOSIS — D519 Vitamin B12 deficiency anemia, unspecified: Secondary | ICD-10-CM | POA: Diagnosis present

## 2017-11-21 DIAGNOSIS — Z91013 Allergy to seafood: Secondary | ICD-10-CM

## 2017-11-21 DIAGNOSIS — D62 Acute posthemorrhagic anemia: Secondary | ICD-10-CM | POA: Diagnosis present

## 2017-11-21 DIAGNOSIS — E785 Hyperlipidemia, unspecified: Secondary | ICD-10-CM | POA: Diagnosis present

## 2017-11-21 DIAGNOSIS — Z794 Long term (current) use of insulin: Secondary | ICD-10-CM | POA: Diagnosis not present

## 2017-11-21 DIAGNOSIS — E1122 Type 2 diabetes mellitus with diabetic chronic kidney disease: Secondary | ICD-10-CM | POA: Diagnosis present

## 2017-11-21 DIAGNOSIS — N400 Enlarged prostate without lower urinary tract symptoms: Secondary | ICD-10-CM | POA: Diagnosis present

## 2017-11-21 DIAGNOSIS — K449 Diaphragmatic hernia without obstruction or gangrene: Secondary | ICD-10-CM | POA: Diagnosis present

## 2017-11-21 DIAGNOSIS — K2970 Gastritis, unspecified, without bleeding: Secondary | ICD-10-CM | POA: Diagnosis present

## 2017-11-21 DIAGNOSIS — Z87891 Personal history of nicotine dependence: Secondary | ICD-10-CM

## 2017-11-21 DIAGNOSIS — I959 Hypotension, unspecified: Secondary | ICD-10-CM | POA: Diagnosis present

## 2017-11-21 DIAGNOSIS — N179 Acute kidney failure, unspecified: Secondary | ICD-10-CM | POA: Diagnosis present

## 2017-11-21 DIAGNOSIS — Z79899 Other long term (current) drug therapy: Secondary | ICD-10-CM | POA: Diagnosis not present

## 2017-11-21 DIAGNOSIS — Z8719 Personal history of other diseases of the digestive system: Secondary | ICD-10-CM | POA: Diagnosis not present

## 2017-11-21 DIAGNOSIS — R633 Feeding difficulties: Secondary | ICD-10-CM | POA: Diagnosis present

## 2017-11-21 DIAGNOSIS — I129 Hypertensive chronic kidney disease with stage 1 through stage 4 chronic kidney disease, or unspecified chronic kidney disease: Secondary | ICD-10-CM | POA: Diagnosis present

## 2017-11-21 DIAGNOSIS — K922 Gastrointestinal hemorrhage, unspecified: Secondary | ICD-10-CM | POA: Diagnosis not present

## 2017-11-21 DIAGNOSIS — T182XXA Foreign body in stomach, initial encounter: Secondary | ICD-10-CM

## 2017-11-21 DIAGNOSIS — K921 Melena: Principal | ICD-10-CM | POA: Diagnosis present

## 2017-11-21 LAB — CBC
HCT: 22.5 % — ABNORMAL LOW (ref 40.0–52.0)
Hemoglobin: 7.5 g/dL — ABNORMAL LOW (ref 13.0–18.0)
MCH: 32.5 pg (ref 26.0–34.0)
MCHC: 33.6 g/dL (ref 32.0–36.0)
MCV: 96.9 fL (ref 80.0–100.0)
PLATELETS: 188 10*3/uL (ref 150–440)
RBC: 2.32 MIL/uL — ABNORMAL LOW (ref 4.40–5.90)
RDW: 15.3 % — AB (ref 11.5–14.5)
WBC: 7.1 10*3/uL (ref 3.8–10.6)

## 2017-11-21 LAB — GLUCOSE, CAPILLARY
GLUCOSE-CAPILLARY: 61 mg/dL — AB (ref 65–99)
GLUCOSE-CAPILLARY: 113 mg/dL — AB (ref 65–99)
Glucose-Capillary: 60 mg/dL — ABNORMAL LOW (ref 65–99)
Glucose-Capillary: 84 mg/dL (ref 65–99)

## 2017-11-21 LAB — COMPREHENSIVE METABOLIC PANEL
ALK PHOS: 17 U/L — AB (ref 38–126)
ALT: 18 U/L (ref 17–63)
ANION GAP: 7 (ref 5–15)
AST: 17 U/L (ref 15–41)
Albumin: 3.2 g/dL — ABNORMAL LOW (ref 3.5–5.0)
BILIRUBIN TOTAL: 0.6 mg/dL (ref 0.3–1.2)
BUN: 80 mg/dL — AB (ref 6–20)
CO2: 20 mmol/L — ABNORMAL LOW (ref 22–32)
CREATININE: 1.82 mg/dL — AB (ref 0.61–1.24)
Calcium: 8.4 mg/dL — ABNORMAL LOW (ref 8.9–10.3)
Chloride: 109 mmol/L (ref 101–111)
GFR calc Af Amer: 40 mL/min — ABNORMAL LOW (ref >60)
GFR, EST NON AFRICAN AMERICAN: 35 mL/min — AB (ref >60)
Glucose, Bld: 86 mg/dL (ref 65–99)
POTASSIUM: 4.9 mmol/L (ref 3.5–5.1)
Sodium: 136 mmol/L (ref 135–145)
Total Protein: 5.6 g/dL — ABNORMAL LOW (ref 6.5–8.1)

## 2017-11-21 LAB — HEMOGLOBIN: Hemoglobin: 9.2 g/dL — ABNORMAL LOW (ref 13.0–18.0)

## 2017-11-21 LAB — PROTIME-INR
INR: 3.67
Prothrombin Time: 36.2 seconds — ABNORMAL HIGH (ref 11.4–15.2)

## 2017-11-21 LAB — IRON AND TIBC
IRON: 193 ug/dL — AB (ref 45–182)
SATURATION RATIOS: 56 % — AB (ref 17.9–39.5)
TIBC: 346 ug/dL (ref 250–450)
UIBC: 153 ug/dL

## 2017-11-21 LAB — FERRITIN: FERRITIN: 29 ng/mL (ref 24–336)

## 2017-11-21 LAB — PREPARE RBC (CROSSMATCH)

## 2017-11-21 LAB — TROPONIN I

## 2017-11-21 LAB — FOLATE: Folate: 11.4 ng/mL (ref >5.9)

## 2017-11-21 LAB — ABO/RH: ABO/RH(D): A POS

## 2017-11-21 LAB — VITAMIN B12: VITAMIN B 12: 120 pg/mL — AB (ref 180–914)

## 2017-11-21 MED ORDER — ONDANSETRON HCL 4 MG/2ML IJ SOLN
4.0000 mg | Freq: Four times a day (QID) | INTRAMUSCULAR | Status: DC | PRN
  Administered 2017-11-23: 4 mg via INTRAVENOUS
  Filled 2017-11-21: qty 2

## 2017-11-21 MED ORDER — SODIUM CHLORIDE 0.9 % IV SOLN
8.0000 mg/h | INTRAVENOUS | Status: DC
  Administered 2017-11-21 – 2017-11-24 (×6): 8 mg/h via INTRAVENOUS
  Filled 2017-11-21 (×7): qty 80

## 2017-11-21 MED ORDER — FLUTICASONE FUROATE-VILANTEROL 100-25 MCG/INH IN AEPB
1.0000 | INHALATION_SPRAY | Freq: Every day | RESPIRATORY_TRACT | Status: DC
  Administered 2017-11-22 – 2017-11-29 (×7): 1 via RESPIRATORY_TRACT
  Filled 2017-11-21: qty 28

## 2017-11-21 MED ORDER — SODIUM CHLORIDE 0.9 % IV SOLN
80.0000 mg | Freq: Once | INTRAVENOUS | Status: AC
  Administered 2017-11-21: 80 mg via INTRAVENOUS
  Filled 2017-11-21: qty 80

## 2017-11-21 MED ORDER — OMEGA-3-ACID ETHYL ESTERS 1 G PO CAPS
4.0000 g | ORAL_CAPSULE | Freq: Every day | ORAL | Status: DC
  Administered 2017-11-21 – 2017-11-29 (×7): 4 g via ORAL
  Filled 2017-11-21 (×7): qty 4

## 2017-11-21 MED ORDER — ACETAMINOPHEN 650 MG RE SUPP: 650.0000 mg | Freq: Four times a day (QID) | RECTAL | Status: DC | PRN

## 2017-11-21 MED ORDER — IOPAMIDOL (ISOVUE-300) INJECTION 61%
30.0000 mL | Freq: Once | INTRAVENOUS | Status: AC | PRN
  Administered 2017-11-21: 30 mL via ORAL

## 2017-11-21 MED ORDER — TAMSULOSIN HCL 0.4 MG PO CAPS
0.4000 mg | ORAL_CAPSULE | Freq: Every evening | ORAL | Status: DC
  Administered 2017-11-21 – 2017-11-28 (×8): 0.4 mg via ORAL
  Filled 2017-11-21 (×8): qty 1

## 2017-11-21 MED ORDER — CARVEDILOL 3.125 MG PO TABS
12.5000 mg | ORAL_TABLET | Freq: Two times a day (BID) | ORAL | Status: DC
  Administered 2017-11-21 – 2017-11-22 (×3): 12.5 mg via ORAL
  Filled 2017-11-21 (×3): qty 4

## 2017-11-21 MED ORDER — ACETAMINOPHEN 325 MG PO TABS
650.0000 mg | ORAL_TABLET | Freq: Four times a day (QID) | ORAL | Status: DC | PRN
  Administered 2017-11-22 – 2017-11-23 (×3): 650 mg via ORAL
  Filled 2017-11-21 (×3): qty 2

## 2017-11-21 MED ORDER — ONDANSETRON HCL 4 MG PO TABS: 4.0000 mg | ORAL_TABLET | Freq: Four times a day (QID) | ORAL | Status: DC | PRN

## 2017-11-21 MED ORDER — SODIUM CHLORIDE 0.9 % IV SOLN
10.0000 mL/h | Freq: Once | INTRAVENOUS | Status: AC
  Administered 2017-11-21: 10 mL/h via INTRAVENOUS

## 2017-11-21 MED ORDER — INSULIN ASPART 100 UNIT/ML ~~LOC~~ SOLN
0.0000 [IU] | Freq: Three times a day (TID) | SUBCUTANEOUS | Status: DC
  Administered 2017-11-22: 2 [IU] via SUBCUTANEOUS
  Administered 2017-11-22: 3 [IU] via SUBCUTANEOUS
  Administered 2017-11-22: 2 [IU] via SUBCUTANEOUS
  Administered 2017-11-23: 3 [IU] via SUBCUTANEOUS
  Administered 2017-11-23: 14:00:00 5 [IU] via SUBCUTANEOUS
  Administered 2017-11-23: 08:00:00 3 [IU] via SUBCUTANEOUS
  Administered 2017-11-24 (×2): 2 [IU] via SUBCUTANEOUS
  Administered 2017-11-24: 17:00:00 1 [IU] via SUBCUTANEOUS
  Administered 2017-11-25 – 2017-11-26 (×4): 2 [IU] via SUBCUTANEOUS
  Administered 2017-11-26: 18:00:00 3 [IU] via SUBCUTANEOUS
  Administered 2017-11-26 – 2017-11-27 (×3): 2 [IU] via SUBCUTANEOUS
  Administered 2017-11-27: 17:00:00 1 [IU] via SUBCUTANEOUS
  Administered 2017-11-28 (×2): 3 [IU] via SUBCUTANEOUS
  Administered 2017-11-28: 2 [IU] via SUBCUTANEOUS
  Administered 2017-11-29: 12:00:00 3 [IU] via SUBCUTANEOUS
  Administered 2017-11-29: 2 [IU] via SUBCUTANEOUS
  Filled 2017-11-21 (×21): qty 1

## 2017-11-21 MED ORDER — SODIUM CHLORIDE 0.9 % IV BOLUS
1000.0000 mL | Freq: Once | INTRAVENOUS | Status: AC
  Administered 2017-11-21: 1000 mL via INTRAVENOUS

## 2017-11-21 MED ORDER — ALLOPURINOL 100 MG PO TABS
100.0000 mg | ORAL_TABLET | Freq: Every day | ORAL | Status: DC
  Administered 2017-11-22 – 2017-11-29 (×6): 100 mg via ORAL
  Filled 2017-11-21 (×8): qty 1

## 2017-11-21 MED ORDER — IOPAMIDOL (ISOVUE-370) INJECTION 76%
60.0000 mL | Freq: Once | INTRAVENOUS | Status: AC | PRN
  Administered 2017-11-21: 60 mL via INTRAVENOUS

## 2017-11-21 MED ORDER — INSULIN ASPART 100 UNIT/ML ~~LOC~~ SOLN
0.0000 [IU] | Freq: Every day | SUBCUTANEOUS | Status: DC
  Administered 2017-11-22 – 2017-11-28 (×4): 2 [IU] via SUBCUTANEOUS
  Filled 2017-11-21 (×3): qty 1

## 2017-11-21 MED ORDER — ATORVASTATIN CALCIUM 20 MG PO TABS
20.0000 mg | ORAL_TABLET | Freq: Every day | ORAL | Status: DC
  Administered 2017-11-21 – 2017-11-28 (×8): 20 mg via ORAL
  Filled 2017-11-21 (×8): qty 1

## 2017-11-21 NOTE — Plan of Care (Signed)
Pt admitted today from the ED. VSS. 2nd unit of blood transfusion completed. No transfusion reaction occurred. 1xloose stool dark brown/burgundy. Denies n/v/pain.

## 2017-11-21 NOTE — ED Triage Notes (Signed)
Pt via ems from home. EMS called for hypotension and dizziness. Pt reports dizziness upon standing for a few days now, as well as dark, "sticky" stools, LLQ abdominal pain. Pt takes pradaxa. Pt alert & oriented, had 250 cc ns PTA. Pt bp upon ems arrival at home was 83/42, final 108/80.

## 2017-11-21 NOTE — ED Notes (Signed)
Pt to ct 

## 2017-11-21 NOTE — Progress Notes (Signed)
Hypoglycemic Event  CBG: 61  Treatment: 4OZ Ginger Ale  Symptoms: none  Follow-up CBG: Time: 1820 CBG Result: 84  Possible Reasons for Event: Pt did not eat breakfast nor lunch. New admission. Comments/MD notified: Dr Darvin Neighbours notified. No new orders.    Jay Morris

## 2017-11-21 NOTE — Consult Note (Signed)
Jay Morris , MD [address], [place], [place], [place], [address] [address], [place], [place], [place], [address] Phone: [phone]  Fax: [phone]  Consultation  Referring Merlin Primary Care Physician:  [name], MD Primary Gastroenterologist:  Dr. Alice Reichert          Reason for Consultation:     Melena  Date of Admission:  11/21/2017 Date of Consultation:  11/21/2017         HPI:   Jay Morris is a 72 y.o. male the patient with Dr. Alice Reichert extranodal clinic.  He was seen by him in November 2018 when he complained of black tarry stools for about a month.  He had been on Pradaxa for atrial fibrillation.  He underwent an upper endoscopy November 2018 by Dr. Alice Reichert and biopsies were taken from the gastric antrum demonstrated possible gastritis.  He did not take any biopsies from the duodenum for celiac disease.  At the same time a colonoscopy was also performed which was essentially normal random colon biopsies were also taken which were negative for lymphocytic colitis.  No plans were made for more bowel capsule study.  Looking back at his labs he had a normocytic anemia in November 2018.  Borderline low ferritin.  He has been admitted to the hospital today for dizziness and exertional dyspnea.  He was also having dark-colored stools and was noted to have a hemoglobin of 7.5 g which is a drop from 12 g which was his baseline.  INR was 3.67.  Elevated creatinine of 1.8 to and BUN is elevated at 80.  Says his last bowel movement was yesterday and was black. Denies any nsaid use or abdominal pain , feels light headed when he sits up. Last dose of Pradaxa today .   Past Medical History:  Diagnosis Date  . Asthma   . Atrial fibrillation ([place])   . BPH (benign prostatic hyperplasia)   . COPD (chronic obstructive pulmonary disease) ([place])   . Diabetes mellitus without complication ([place])   . Helicobacter pylori gastritis   . Hyperlipidemia   . Hypertension   .  Pneumonia     Past Surgical History:  Procedure Laterality Date  . CARDIAC ELECTROPHYSIOLOGY MAPPING AND ABLATION    . COLONOSCOPY WITH PROPOFOL N/A 06/15/2017   Procedure: COLONOSCOPY WITH PROPOFOL;  Surgeon: Toledo, Benay Pike, MD;  Location: [facility] ENDOSCOPY;  Service: Gastroenterology;  Laterality: N/A;  . ESOPHAGOGASTRODUODENOSCOPY (EGD) WITH PROPOFOL N/A 06/02/2017   Procedure: ESOPHAGOGASTRODUODENOSCOPY (EGD) WITH PROPOFOL;  Surgeon: Toledo, Benay Pike, MD;  Location: [facility] ENDOSCOPY;  Service: Gastroenterology;  Laterality: N/A;  . HAND SURGERY      Prior to Admission medications   Medication Sig Start Date End Date Taking? Authorizing Provider  allopurinol (ZYLOPRIM) 100 MG tablet Take 100 mg by mouth daily.   Yes [provider]  atorvastatin (LIPITOR) 20 MG tablet Take 20 mg by mouth at bedtime. 12/09/15  Yes [provider]  BREO ELLIPTA 100-25 MCG/INH AEPB Inhale 1 puff into the lungs daily. 12/04/15  Yes [provider]  carvedilol (COREG) 6.25 MG tablet Take 12.5 mg by mouth 2 (two) times daily. 11/02/17  Yes [provider]  furosemide (LASIX) 20 MG tablet Take 20 mg by mouth daily. 12/29/15  Yes [provider]  indomethacin (INDOCIN) 50 MG capsule Take 50 mg by mouth 3 (three) times daily as needed (gout).   Yes [provider]  insulin aspart (NOVOLOG) 100 UNIT/ML injection Inject 15-25 Units into  the skin 3 (three) times daily before meals. Inject 25 units before breakfast, 15 units before lunch and 25 units before dinner.   Yes [provider]  insulin degludec (TRESIBA FLEXTOUCH) 100 UNIT/ML SOPN FlexTouch Pen Inject 100 Units into the skin daily at 10 pm.    Yes [provider]  lisinopril (PRINIVIL,ZESTRIL) 40 MG tablet Take 40 mg by mouth at bedtime.  12/29/15  Yes [provider]  metFORMIN (GLUCOPHAGE) 1000 MG tablet Take 1,000 mg by mouth 2 (two) times daily. 12/17/15  Yes [provider]  omega-3 acid ethyl esters (LOVAZA) 1 g capsule Take 4 g by mouth daily. 12/29/15  Yes [provider]  PRADAXA 150 MG CAPS capsule Take 150 mg by mouth 2 (two) times daily. 12/11/15  Yes [provider]  spironolactone (ALDACTONE) 25 MG tablet Take 25 mg daily by mouth.   Yes [provider]  tamsulosin (FLOMAX) 0.4 MG CAPS capsule Take 0.4 mg by mouth every evening.  12/17/15  Yes [provider]  traMADol (ULTRAM) 50 MG tablet Take 1 tablet (50 mg total) by mouth every 6 (six) hours as needed for moderate pain. Patient not taking: Reported on 06/02/2017 01/09/16   Hillary Bow, MD    Family History  Problem Relation Age of Onset  . Heart disease Mother   . Cancer Father      Social History   Tobacco Use  . Smoking status: Former Smoker    Packs/day: 1.00    Years: 30.00    Pack years: 30.00    Types: Cigarettes  . Smokeless tobacco: Never Used  Substance Use Topics  . Alcohol use: No    Alcohol/week: 0.0 oz    Comment: occasional  . Drug use: No    Allergies as of 11/21/2017 - Review Complete 11/21/2017  Allergen Reaction Noted  . Shrimp [shellfish allergy] Anaphylaxis 11/21/2017    Review of Systems:    All systems reviewed and negative except where noted in HPI.   Physical Exam:  Vital signs in last 24 hours: Temp:  [97.4 F (36.3 C)-97.6 F (36.4 C)] 97.6 F (36.4 C) (05/06 1526) Pulse Rate:  [58-82] 64 (05/06 1526) Resp:  [13-31] 18 (05/06 1526) BP: (91-146)/(41-89) 141/51 (05/06 1526) SpO2:  [97 %-100 %] 100 % (05/06 1526) Weight:  [198 lb (89.8 kg)] 198 lb (89.8 kg) (05/06 1105)   General:   Pleasant, cooperative in NAD Head:  Normocephalic and atraumatic. Eyes:   No icterus.   Conjunctiva pink. PERRLA. Ears:  Normal auditory acuity. Neck:  Supple; no masses or thyroidomegaly Lungs: Respirations even and unlabored. Lungs clear to auscultation bilaterally.   No wheezes, crackles, or rhonchi.  Heart:  Regular rate  and rhythm;  Without murmur, clicks, rubs or gallops Abdomen:  Soft, nondistended, nontender. Normal bowel sounds. No appreciable masses or hepatomegaly.  No rebound or guarding.  Neurologic:  Alert and oriented x3;  grossly normal neurologically. Skin:  Intact without significant lesions or rashes. Cervical Nodes:  No significant cervical adenopathy. Psych:  Alert and cooperative. Normal affect.  LAB RESULTS: Recent Labs    11/21/17 1113  WBC 7.1  HGB 7.5*  HCT 22.5*  PLT 188   BMET Recent Labs    11/21/17 1113  NA 136  K 4.9  CL 109  CO2 20*  GLUCOSE 86  BUN 80*  CREATININE 1.82*  CALCIUM 8.4*   LFT Recent Labs    11/21/17 1113  PROT 5.6*  ALBUMIN 3.2*  AST 17  ALT 18  ALKPHOS 17*  BILITOT 0.6   PT/INR Recent Labs    11/21/17 1113  LABPROT 36.2*  INR 3.67    STUDIES: Ct Abdomen Pelvis W Contrast  Result Date: 11/21/2017 CLINICAL DATA:  Dizziness and left lower quadrant pain EXAM: CT ABDOMEN AND PELVIS WITH CONTRAST TECHNIQUE: Multidetector CT imaging of the abdomen and pelvis was performed using the standard protocol following bolus administration of intravenous contrast. CONTRAST:  56m ISOVUE-370 COMPARISON:  None. FINDINGS: Lower chest: Lung bases demonstrate some minimal scarring in the lingula. No focal infiltrate or effusion is seen. Hepatobiliary: No focal liver abnormality is seen. No gallstones, gallbladder wall thickening, or biliary dilatation. Pancreas: Unremarkable. No pancreatic ductal dilatation or surrounding inflammatory changes. Spleen: Normal in size without focal abnormality. Adrenals/Urinary Tract: Adrenal glands are unremarkable. Kidneys are normal, without renal calculi, focal lesion, or hydronephrosis. Bladder is unremarkable. Stomach/Bowel: Stomach is within normal limits. Appendix appears normal. No evidence of bowel wall thickening, distention, or inflammatory changes. Vascular/Lymphatic: Aortic atherosclerosis. No enlarged abdominal or  pelvic lymph nodes. Reproductive: Prostate is unremarkable. Other: No abdominal wall hernia or abnormality. No abdominopelvic ascites. Musculoskeletal: Degenerative changes of lumbar spine are noted. IMPRESSION: No acute abnormality noted. Electronically Signed   By: MInez CatalinaM.D.   On: 11/21/2017 13:16      Impression / Plan:   CJamonte Curfmanis a 72y.o. y/o male with atrial fibrillation who has [facility] in November 2018 [facility] deficiency anemia.  Upper endoscopy and colonoscopy were noncontributory and showed no signs of bleeding.  No capsule study of the small bowel was performed.  The patient has been on Pradaxa.  Today he has been admitted with an acute drop in hemoglobin from 12 g to 7.5 g.  Black-colored stools for a few days.   Plan 1.  Transfuse to target hemoglobin. 2.  Hold Pradaxa if okay from cardiology point of view for about 2 days.  Then it would be safe to perform an upper endoscopy on Thursday  and if negative will proceed with: Small bowel capsule study. 3.  Suggest to check iron studies B12 folate and if low replace as appropriate. 4.  PPI  Thank you for involving me in the care of this patient.      LOS: 0 days   KJonathon Bellows MD  11/21/2017, 3:55 PM

## 2017-11-21 NOTE — H&P (Signed)
[facility] NAME: Jay Morris    MR#:  [id number]  DATE OF BIRTH:  August 08, 1945  DATE OF ADMISSION:  11/21/2017  PRIMARY CARE PHYSICIAN: [name], MD   REQUESTING/REFERRING PHYSICIAN: Dr. Harvest Dark  CHIEF COMPLAINT:   Chief Complaint  Patient presents with  . Hypotension  . Dizziness    HISTORY OF PRESENT ILLNESS:  Jay Morris  is a 72 y.o. male with a known history of atrial fibrillation, hypertension, hyperlipidemia, diabetes, history of COPD who presents to the hospital due to dizziness weakness and exertional dyspnea.  Patient says he developed dizziness on standing over the past 2 to 3 days accompanied with exertional shortness of breath and significant fatigue.  He is also noted that his stools have been more dark in color and loose.  He came to the ER and was noted to have a hemoglobin of 7.5 with a previous hemoglobin in January of this year being closer to 12.  Patient had heme positive stools as per the ER physician and was noted to have a suspected upper GI bleed and therefore hospitalist services were contacted for treatment evaluation.  Patient denies any other associated symptoms including abdominal pain, chest pains, fever, chills, cough, diarrhea.  Patient did complain of intermittent nausea but no acute vomiting.  PAST MEDICAL HISTORY:   Past Medical History:  Diagnosis Date  . Asthma   . Atrial fibrillation ([place])   . BPH (benign prostatic hyperplasia)   . COPD (chronic obstructive pulmonary disease) ([place])   . Diabetes mellitus without complication ([place])   . Helicobacter pylori gastritis   . Hyperlipidemia   . Hypertension   . Pneumonia     PAST SURGICAL HISTORY:   Past Surgical History:  Procedure Laterality Date  . CARDIAC ELECTROPHYSIOLOGY MAPPING AND ABLATION    . COLONOSCOPY WITH PROPOFOL N/A 06/15/2017   Procedure: COLONOSCOPY WITH PROPOFOL;  Surgeon: Toledo, Benay Pike, MD;   Location: [facility] ENDOSCOPY;  Service: Gastroenterology;  Laterality: N/A;  . ESOPHAGOGASTRODUODENOSCOPY (EGD) WITH PROPOFOL N/A 06/02/2017   Procedure: ESOPHAGOGASTRODUODENOSCOPY (EGD) WITH PROPOFOL;  Surgeon: Toledo, Benay Pike, MD;  Location: [facility] ENDOSCOPY;  Service: Gastroenterology;  Laterality: N/A;  . HAND SURGERY      SOCIAL HISTORY:   Social History   Tobacco Use  . Smoking status: Former Smoker    Packs/day: 1.00    Years: 30.00    Pack years: 30.00    Types: Cigarettes  . Smokeless tobacco: Never Used  Substance Use Topics  . Alcohol use: No    Alcohol/week: 0.0 oz    Comment: occasional    FAMILY HISTORY:   Family History  Problem Relation Age of Onset  . Heart disease Mother   . Cancer Father     DRUG ALLERGIES:   Allergies  Allergen Reactions  . Shrimp [Shellfish Allergy] Anaphylaxis    REVIEW OF SYSTEMS:   Review of Systems  Constitutional: Positive for malaise/fatigue. Negative for fever and weight loss.  HENT: Negative for congestion, nosebleeds and tinnitus.   Eyes: Negative for blurred vision, double vision and redness.  Respiratory: Positive for shortness of breath. Negative for cough and hemoptysis.   Cardiovascular: Negative for chest pain, orthopnea, leg swelling and PND.  Gastrointestinal: Negative for abdominal pain, diarrhea, melena, nausea and vomiting.  Genitourinary: Negative for dysuria, hematuria and urgency.  Musculoskeletal: Negative for falls and joint pain.  Neurological: Positive for dizziness and weakness. Negative for tingling, sensory change, focal weakness,  seizures and headaches.  Endo/Heme/Allergies: Negative for polydipsia. Does not bruise/bleed easily.  Psychiatric/Behavioral: Negative for depression and memory loss. The patient is not nervous/anxious.     MEDICATIONS AT HOME:   Prior to Admission medications   Medication Sig Start Date End Date Taking? Authorizing Provider  allopurinol (ZYLOPRIM) 100 MG tablet Take  100 mg by mouth daily.   Yes [provider]  atorvastatin (LIPITOR) 20 MG tablet Take 20 mg by mouth at bedtime. 12/09/15  Yes [provider]  BREO ELLIPTA 100-25 MCG/INH AEPB Inhale 1 puff into the lungs daily. 12/04/15  Yes [provider]  carvedilol (COREG) 6.25 MG tablet Take 12.5 mg by mouth 2 (two) times daily. 11/02/17  Yes [provider]  furosemide (LASIX) 20 MG tablet Take 20 mg by mouth daily. 12/29/15  Yes [provider]  indomethacin (INDOCIN) 50 MG capsule Take 50 mg by mouth 3 (three) times daily as needed (gout).   Yes [provider]  insulin aspart (NOVOLOG) 100 UNIT/ML injection Inject 15-25 Units into the skin 3 (three) times daily before meals. Inject 25 units before breakfast, 15 units before lunch and 25 units before dinner.   Yes [provider]  insulin degludec (TRESIBA FLEXTOUCH) 100 UNIT/ML SOPN FlexTouch Pen Inject 100 Units into the skin daily at 10 pm.    Yes [provider]  lisinopril (PRINIVIL,ZESTRIL) 40 MG tablet Take 40 mg by mouth at bedtime.  12/29/15  Yes [provider]  metFORMIN (GLUCOPHAGE) 1000 MG tablet Take 1,000 mg by mouth 2 (two) times daily. 12/17/15  Yes [provider]  omega-3 acid ethyl esters (LOVAZA) 1 g capsule Take 4 g by mouth daily. 12/29/15  Yes [provider]  PRADAXA 150 MG CAPS capsule Take 150 mg by mouth 2 (two) times daily. 12/11/15  Yes [provider]  spironolactone (ALDACTONE) 25 MG tablet Take 25 mg daily by mouth.   Yes [provider]  tamsulosin (FLOMAX) 0.4 MG CAPS capsule Take 0.4 mg by mouth every evening.  12/17/15  Yes [provider]  traMADol (ULTRAM) 50 MG tablet Take 1 tablet (50 mg total) by mouth every 6 (six) hours as needed for moderate pain. Patient not taking: Reported on 06/02/2017 01/09/16   Hillary Bow, MD      VITAL SIGNS:  Blood pressure 129/65, pulse 66, temperature (!) 97.4 F  (36.3 C), temperature source Oral, resp. rate (!) 22, height _0  (1.727 m), weight 89.8 kg (198 lb), SpO2 99 %.  PHYSICAL EXAMINATION:  Physical Exam  GENERAL:  72 y.o.-year-old pale appearing patient lying in the bed with no acute distress.  EYES: Pupils equal, round, reactive to light and accommodation. No scleral icterus. Pale conjunctiva. Extraocular muscles intact.  HEENT: Head atraumatic, normocephalic. Oropharynx and nasopharynx clear. No oropharyngeal erythema, moist oral mucosa  NECK:  Supple, no jugular venous distention. No thyroid enlargement, no tenderness.  LUNGS: Normal breath sounds bilaterally, no wheezing, rales, rhonchi. No use of accessory muscles of respiration.  CARDIOVASCULAR: S1, S2 RRR. No murmurs, rubs, gallops, clicks.  ABDOMEN: Soft, nontender, nondistended. Bowel sounds present. No organomegaly or mass.  EXTREMITIES: No pedal edema, cyanosis, or clubbing. + 2 pedal & radial pulses b/l.   NEUROLOGIC: Cranial nerves II through XII are intact. No focal Motor or sensory deficits appreciated b/l.  PSYCHIATRIC: The patient is alert and oriented x 3..  SKIN: No obvious rash, lesion, or ulcer.   LABORATORY PANEL:   CBC Recent Labs  Lab 11/21/17 1113  WBC 7.1  HGB 7.5*  HCT 22.5*  PLT 188   ------------------------------------------------------------------------------------------------------------------  Chemistries  Recent Labs  Lab 11/21/17 1113  NA 136  K 4.9  CL 109  CO2 20*  GLUCOSE 86  BUN 80*  CREATININE 1.82*  CALCIUM 8.4*  AST 17  ALT 18  ALKPHOS 17*  BILITOT 0.6   ------------------------------------------------------------------------------------------------------------------  Cardiac Enzymes Recent Labs  Lab 11/21/17 1113  TROPONINI <0.03   ------------------------------------------------------------------------------------------------------------------  RADIOLOGY:  Ct Abdomen Pelvis W Contrast  Result Date:  11/21/2017 CLINICAL DATA:  Dizziness and left lower quadrant pain EXAM: CT ABDOMEN AND PELVIS WITH CONTRAST TECHNIQUE: Multidetector CT imaging of the abdomen and pelvis was performed using the standard protocol following bolus administration of intravenous contrast. CONTRAST:  39m ISOVUE-370 COMPARISON:  None. FINDINGS: Lower chest: Lung bases demonstrate some minimal scarring in the lingula. No focal infiltrate or effusion is seen. Hepatobiliary: No focal liver abnormality is seen. No gallstones, gallbladder wall thickening, or biliary dilatation. Pancreas: Unremarkable. No pancreatic ductal dilatation or surrounding inflammatory changes. Spleen: Normal in size without focal abnormality. Adrenals/Urinary Tract: Adrenal glands are unremarkable. Kidneys are normal, without renal calculi, focal lesion, or hydronephrosis. Bladder is unremarkable. Stomach/Bowel: Stomach is within normal limits. Appendix appears normal. No evidence of bowel wall thickening, distention, or inflammatory changes. Vascular/Lymphatic: Aortic atherosclerosis. No enlarged abdominal or pelvic lymph nodes. Reproductive: Prostate is unremarkable. Other: No abdominal wall hernia or abnormality. No abdominopelvic ascites. Musculoskeletal: Degenerative changes of lumbar spine are noted. IMPRESSION: No acute abnormality noted. Electronically Signed   By: MInez CatalinaM.D.   On: 11/21/2017 13:16     IMPRESSION AND PLAN:   72year old male with past medical history of atrial fibrillation, hypertension, hyperlipidemia, COPD, diabetes who presents to the hospital due to dizziness, generalized weakness and black stools noted to be anemic.  1.  GI bleed- suspected to be upper GI bleed given the patient's melanotic stools.  Patient's hemoglobin is down to 7.5 with a baseline hemoglobin in January of this past year being around 12. - Patient will be transfused 2 units of packed red blood cells, will follow serial hemoglobin. - Placed the patient on  a Protonix drip, get a gastroenterology consult and discussed the case with Dr. AVicente Males - Placed patient on clear liquid diet for now.  Hold Pradaxa.  2.  Anemia-this is acute blood loss anemia secondary to GI bleed. -Patient will be transfused 2 units of packed red blood cells.  Will follow hemoglobin.  Hold Pradaxa for now.  3.  Diabetes type 2 without complication-hold patient's scheduled insulin and metformin for now.  We will place the patient on sliding scale insulin.  4.  History of chronic atrial fibrillation-rate controlled.  Continue carvedilol.  Hold Pradaxa given the anemia/GI bleed.  5.  BPH-continue Flomax.  6.  COPD-no acute exacerbation-continue Brio Ellipta  7.  History of gout-no acute attack.  Continue allopurinol.  8.  Hyperlipidemia-continue atorvastatin.    All the records are reviewed and case discussed with ED provider. Management plans discussed with the patient, family and they are in agreement.  CODE STATUS: Full code  TOTAL TIME TAKING CARE OF THIS PATIENT: 40 minutes.    SHenreitta LeberM.D on 11/21/2017 at 2:23 PM  Between 7am to 6pm - Pager - [phone]  After 6pm go to [email or web address] - password [email or web address]  Office  [phone] CC: Primary care physician; SIdelle Crouch MD

## 2017-11-21 NOTE — ED Provider Notes (Signed)
[facility] Emergency Department Provider Note  Time seen: 11:30 AM  I have reviewed the triage vital signs and the nursing notes.   HISTORY  Chief Complaint Hypotension and Dizziness    HPI Jay Morris is a 72 y.o. male with a past medical history of atrial fibrillation on anticoagulation, COPD, diabetes, hypertension, hyperlipidemia, presents to the emergency department with dizziness and lightheadedness.  Patient found to be hypotensive 80/40 by EMS.  Patient states over the past 3 days he has had very loose dark stool, history of prior GI bleeds.  States he had a colonoscopy several months ago for the same but they were never able to find a cause of the bleed.  Patient also states mild dull aching pain across his lower abdomen rated as a 2/10 over the past several days as well.  States nausea but denies any vomiting.  Denies any fever, states mild cough on review of systems otherwise largely negative.   Past Medical History:  Diagnosis Date  . Asthma   . Atrial fibrillation ([place])   . BPH (benign prostatic hyperplasia)   . COPD (chronic obstructive pulmonary disease) ([place])   . Diabetes mellitus without complication ([place])   . Helicobacter pylori gastritis   . Hyperlipidemia   . Hypertension   . Pneumonia     Patient Active Problem List   Diagnosis Date Noted  . Pneumonia 01/04/2016    Past Surgical History:  Procedure Laterality Date  . CARDIAC ELECTROPHYSIOLOGY MAPPING AND ABLATION    . COLONOSCOPY WITH PROPOFOL N/A 06/15/2017   Procedure: COLONOSCOPY WITH PROPOFOL;  Surgeon: Toledo, Benay Pike, MD;  Location: [facility] ENDOSCOPY;  Service: Gastroenterology;  Laterality: N/A;  . ESOPHAGOGASTRODUODENOSCOPY (EGD) WITH PROPOFOL N/A 06/02/2017   Procedure: ESOPHAGOGASTRODUODENOSCOPY (EGD) WITH PROPOFOL;  Surgeon: Toledo, Benay Pike, MD;  Location: [facility] ENDOSCOPY;  Service: Gastroenterology;  Laterality: N/A;  . HAND SURGERY      Prior to Admission  medications   Medication Sig Start Date End Date Taking? Authorizing Provider  atorvastatin (LIPITOR) 20 MG tablet Take 20 mg by mouth at bedtime. 12/09/15   [provider]  BREO ELLIPTA 100-25 MCG/INH AEPB Inhale 1 puff into the lungs daily. 12/04/15   [provider]  carvedilol (COREG) 12.5 MG tablet Take 12.5 mg 2 (two) times daily with a meal by mouth.    [provider]  furosemide (LASIX) 20 MG tablet Take 20 mg by mouth daily. 12/29/15   [provider]  insulin aspart (NOVOLOG) 100 UNIT/ML injection Inject 3 (three) times daily before meals into the skin.    [provider]  insulin degludec (TRESIBA FLEXTOUCH) 100 UNIT/ML SOPN FlexTouch Pen Inject daily at 10 pm into the skin.    [provider]  LANTUS 100 UNIT/ML injection Inject 70 Units into the skin 2 (two) times daily. 12/29/15   [provider]  levofloxacin (LEVAQUIN) 750 MG tablet Take 1 tablet (750 mg total) by mouth daily. 01/09/16   Hillary Bow, MD  lisinopril (PRINIVIL,ZESTRIL) 40 MG tablet Take 40 mg by mouth at bedtime.  12/29/15   [provider]  metFORMIN (GLUCOPHAGE) 1000 MG tablet Take 1,000 mg by mouth 2 (two) times daily. 12/17/15   [provider]  omega-3 acid ethyl esters (LOVAZA) 1 g capsule Take 4 g by mouth daily. 12/29/15   [provider]  PRADAXA 150 MG CAPS capsule Take 150 mg by mouth 2 (two) times daily. 12/11/15   [provider]  spironolactone (ALDACTONE)  25 MG tablet Take 25 mg daily by mouth.    [provider]  tamsulosin (FLOMAX) 0.4 MG CAPS capsule Take 0.4 mg by mouth every evening.  12/17/15   [provider]  traMADol (ULTRAM) 50 MG tablet Take 1 tablet (50 mg total) by mouth every 6 (six) hours as needed for moderate pain. Patient not taking: Reported on 06/02/2017 01/09/16   Hillary Bow, MD    Allergies  Allergen Reactions  . Shellfish-Derived Products Anaphylaxis  . Shrimp  [Shellfish Allergy]     Family History  Problem Relation Age of Onset  . Heart disease Mother   . Cancer Father     Social History Social History   Tobacco Use  . Smoking status: Former Smoker    Packs/day: 1.00    Years: 30.00    Pack years: 30.00    Types: Cigarettes  . Smokeless tobacco: Never Used  Substance Use Topics  . Alcohol use: No    Alcohol/week: 0.0 oz    Comment: occasional  . Drug use: No    Review of Systems Constitutional: Negative for fever.  Positive for lightheadedness/dizziness. Eyes: Negative for visual complaints ENT: Negative for recent illness/congestion Cardiovascular: Negative for chest pain. Respiratory: Negative for shortness of breath.  Positive for cough, mild. Gastrointestinal: 2/10 dull lower abdominal discomfort.  Positive for nausea but negative for vomiting.  Positive for loose dark stool. Genitourinary: Negative for urinary compaints Musculoskeletal: Negative for musculoskeletal complaints Skin: Negative for skin complaints  Neurological: Negative for headache All other ROS negative  ____________________________________________   PHYSICAL EXAM:  VITAL SIGNS: ED Triage Vitals  Enc Vitals Group     BP 11/21/17 1103 (!) 102/41     Pulse Rate 11/21/17 1103 (!) 58     Resp 11/21/17 1103 15     Temp 11/21/17 1103 (!) 97.5 F (36.4 C)     Temp src --      SpO2 11/21/17 1103 100 %     Weight 11/21/17 1105 198 lb (89.8 kg)     Height 11/21/17 1105 _0  (1.727 m)     Head Circumference --      Peak Flow --      Pain Score 11/21/17 1103 0     Pain Loc --      Pain Edu? --      Excl. in [place]? --    Constitutional: Alert and oriented. Well appearing and in no distress. Eyes: Normal exam ENT   Head: Normocephalic and atraumatic.   Mouth/Throat: Mucous membranes are moist. Cardiovascular: Normal rate, regular rhythm. Respiratory: Normal respiratory effort without tachypnea nor retractions. Breath sounds are  clear Gastrointestinal: Soft, slight suprapubic tenderness to palpation.  No rebound or guarding.  No distention. Musculoskeletal: Nontender with normal range of motion in all extremities.  Neurologic:  Normal speech and language. No gross focal neurologic deficits Skin:  Skin is warm, dry and intact.  Psychiatric: Mood and affect are normal.   ____________________________________________    EKG  EKG reviewed and interpreted by myself shows atrial fibrillation at 61 bpm with a narrow QRS, normal axis, largely normal intervals with nonspecific ST changes.  ____________________________________________    RADIOLOGY  CT scan negative  ____________________________________________   INITIAL IMPRESSION / ASSESSMENT AND PLAN / ED COURSE  Pertinent labs & imaging results that were available during my care of the patient were reviewed by me and considered in my medical decision making (see chart for details).  Patient presents to the emergency  department for lower abdominal discomfort dark stool dizziness/lightheadedness.  Differential would include GI bleed, colitis, diverticulitis, diverticulosis, upper versus lower GI bleed, ACS, dehydration/hypovolemia, infectious etiology.  We will check labs and continue to closely monitor.  Rectal examination shows dark black stool strongly guaiac positive consistent with GI bleed.  We will start on Protonix bolus and infusion.  Patient's blood work has resulted with a hemoglobin of 7.5 nearly 4 points down from his baseline of 11.5.  I have ordered 2 units of packed red blood cells for the patient.  I have verbally consented the patient we will have the patient sign a written consent.  Patient agreeable to this plan of care.  Will require admission to the hospital.  Given the patient's dull lower abdominal discomfort with mild suprapubic tenderness we will also obtain a CT imaging of the abdomen to help evaluate for colitis/diverticulitis.  CT scan is  negative.  Patient will be admitted to the hospitalist service for further treatment and work-up.  CRITICAL CARE Performed by: Harvest Dark   Total critical care time: 30 minutes  Critical care time was exclusive of separately billable procedures and treating other patients.  Critical care was necessary to treat or prevent imminent or life-threatening deterioration.  Critical care was time spent personally by me on the following activities: development of treatment plan with patient and/or surrogate as well as nursing, discussions with consultants, evaluation of patient's response to treatment, examination of patient, obtaining history from patient or surrogate, ordering and performing treatments and interventions, ordering and review of laboratory studies, ordering and review of radiographic studies, pulse oximetry and re-evaluation of patient's condition.   ____________________________________________   FINAL CLINICAL IMPRESSION(S) / ED DIAGNOSES  GI bleed     Harvest Dark, MD 11/21/17 1341

## 2017-11-22 LAB — BASIC METABOLIC PANEL
ANION GAP: 5 (ref 5–15)
BUN: 63 mg/dL — ABNORMAL HIGH (ref 6–20)
CO2: 23 mmol/L (ref 22–32)
Calcium: 8.2 mg/dL — ABNORMAL LOW (ref 8.9–10.3)
Chloride: 110 mmol/L (ref 101–111)
Creatinine, Ser: 1.36 mg/dL — ABNORMAL HIGH (ref 0.61–1.24)
GFR calc Af Amer: 58 mL/min — ABNORMAL LOW (ref >60)
GFR, EST NON AFRICAN AMERICAN: 50 mL/min — AB (ref >60)
GLUCOSE: 130 mg/dL — AB (ref 65–99)
POTASSIUM: 5 mmol/L (ref 3.5–5.1)
Sodium: 138 mmol/L (ref 135–145)

## 2017-11-22 LAB — CBC
HCT: 25.6 % — ABNORMAL LOW (ref 40.0–52.0)
HEMOGLOBIN: 9.2 g/dL — AB (ref 13.0–18.0)
MCH: 33.5 pg (ref 26.0–34.0)
MCHC: 35.8 g/dL (ref 32.0–36.0)
MCV: 93.6 fL (ref 80.0–100.0)
Platelets: 174 10*3/uL (ref 150–440)
RBC: 2.74 MIL/uL — ABNORMAL LOW (ref 4.40–5.90)
RDW: 15.1 % — ABNORMAL HIGH (ref 11.5–14.5)
WBC: 8.6 10*3/uL (ref 3.8–10.6)

## 2017-11-22 LAB — GLUCOSE, CAPILLARY
GLUCOSE-CAPILLARY: 137 mg/dL — AB (ref 65–99)
GLUCOSE-CAPILLARY: 172 mg/dL — AB (ref 65–99)
GLUCOSE-CAPILLARY: 207 mg/dL — AB (ref 65–99)
Glucose-Capillary: 156 mg/dL — ABNORMAL HIGH (ref 65–99)
Glucose-Capillary: 202 mg/dL — ABNORMAL HIGH (ref 65–99)

## 2017-11-22 MED ORDER — SODIUM CHLORIDE 0.9 % IV SOLN
INTRAVENOUS | Status: DC
  Administered 2017-11-22 – 2017-11-23 (×2): via INTRAVENOUS

## 2017-11-22 NOTE — Plan of Care (Signed)
  Problem: Education: Goal: Knowledge of General Education information will improve Outcome: Progressing   Problem: Health Behavior/Discharge Planning: Goal: Ability to manage health-related needs will improve Outcome: Progressing   Problem: Coping: Goal: Level of anxiety will decrease Outcome: Progressing   Problem: Elimination: Goal: Will not experience complications related to bowel motility Outcome: Progressing Goal: Will not experience complications related to urinary retention Outcome: Progressing   Problem: Safety: Goal: Ability to remain free from injury will improve Outcome: Progressing   Problem: Skin Integrity: Goal: Risk for impaired skin integrity will decrease Outcome: Progressing   Problem: Education: Goal: Ability to identify signs and symptoms of gastrointestinal bleeding will improve Outcome: Progressing   Problem: Bowel/Gastric: Goal: Will show no signs and symptoms of gastrointestinal bleeding Outcome: Progressing

## 2017-11-22 NOTE — Progress Notes (Signed)
[place] at [place] NAME: Jay Morris    MR#:  [id number]  DATE OF BIRTH:  05-07-1946  SUBJECTIVE:  CHIEF COMPLAINT:   Chief Complaint  Patient presents with  . Hypotension  . Dizziness   Dizziness when stands up and generalized weakness.  No melena or bloody stool today. REVIEW OF SYSTEMS:  Review of Systems  Constitutional: Positive for malaise/fatigue. Negative for chills and fever.  HENT: Negative for sore throat.   Eyes: Negative for blurred vision and double vision.  Respiratory: Negative for cough, hemoptysis, shortness of breath, wheezing and stridor.   Cardiovascular: Negative for chest pain, palpitations, orthopnea and leg swelling.  Gastrointestinal: Negative for abdominal pain, blood in stool, diarrhea, melena, nausea and vomiting.  Genitourinary: Negative for dysuria, flank pain and hematuria.  Musculoskeletal: Negative for back pain and joint pain.  Skin: Negative for rash.  Neurological: Positive for dizziness. Negative for sensory change, focal weakness, seizures, loss of consciousness, weakness and headaches.  Endo/Heme/Allergies: Negative for polydipsia.  Psychiatric/Behavioral: Negative for depression. The patient is not nervous/anxious.     DRUG ALLERGIES:   Allergies  Allergen Reactions  . Shrimp [Shellfish Allergy] Anaphylaxis   VITALS:  Blood pressure (!) 118/51, pulse 67, temperature 97.8 F (36.6 C), temperature source Oral, resp. rate 18, height _0  (1.727 m), weight 198 lb 8 oz (90 kg), SpO2 96 %. PHYSICAL EXAMINATION:  Physical Exam  Constitutional: He is oriented to person, place, and time.  HENT:  Head: Normocephalic.  Mouth/Throat: Oropharynx is clear and moist.  Eyes: Pupils are equal, round, and reactive to light. Conjunctivae and EOM are normal. No scleral icterus.  Neck: Normal range of motion. Neck supple. No JVD present. No tracheal deviation present.  Cardiovascular: Normal rate,  regular rhythm and normal heart sounds. Exam reveals no gallop.  No murmur heard. Pulmonary/Chest: Effort normal and breath sounds normal. No respiratory distress. He has no wheezes. He has no rales.  Abdominal: Soft. Bowel sounds are normal. He exhibits no distension. There is no tenderness. There is no rebound.  Musculoskeletal: Normal range of motion. He exhibits no edema or tenderness.  Neurological: He is alert and oriented to person, place, and time. No cranial nerve deficit.  Skin: No rash noted. No erythema.   LABORATORY PANEL:  Male CBC Recent Labs  Lab 11/22/17 0558  WBC 8.6  HGB 9.2*  HCT 25.6*  PLT 174   ------------------------------------------------------------------------------------------------------------------ Chemistries  Recent Labs  Lab 11/21/17 1113 11/22/17 0558  NA 136 138  K 4.9 5.0  CL 109 110  CO2 20* 23  GLUCOSE 86 130*  BUN 80* 63*  CREATININE 1.82* 1.36*  CALCIUM 8.4* 8.2*  AST 17  --   ALT 18  --   ALKPHOS 17*  --   BILITOT 0.6  --    RADIOLOGY:  No results found. ASSESSMENT AND PLAN:   72 year old male with past medical history of atrial fibrillation, hypertension, hyperlipidemia, COPD, diabetes who presents to the hospital due to dizziness, generalized weakness and black stools noted to be anemic.  1.  GI bleed- suspected to be upper GI bleed given the patient's melanotic stools.  Patient's hemoglobin is down to 7.5 with a baseline hemoglobin in January of this past year being around 41. S/p 2 units of packed red blood cells,  continue Protonix drip, g EGD 2 days later and hold pradaxa Dr. Vicente Males. On clear liquid diet for now.  Hold Pradaxa.  2.  Anemia-this is acute blood loss anemia secondary to GI bleed. Sj/p 2 units of packed red blood cells.  Hb 9.2,  follow hemoglobin.  Hold Pradaxa for now.  3.  Diabetes type 2 without complication  on sliding scale insulin.  4.  History of chronic atrial fibrillation-rate controlled.   Continue carvedilol.  Hold Pradaxa given the anemia/GI bleed.  5.  BPH-continue Flomax.  6.  COPD-no acute exacerbation-continue Brio Ellipta  7.  History of gout-no acute attack.  Continue allopurinol.  8.  Hyperlipidemia-continue atorvastatin.  ARF on CKD stage 3. IVF and f/u BMP.  All the records are reviewed and case discussed with Care Management/Social Worker. Management plans discussed with the patient, family and they are in agreement.  CODE STATUS: Full Code  TOTAL TIME TAKING CARE OF THIS PATIENT: 36 minutes.   More than 50% of the time was spent in counseling/coordination of care: YES  POSSIBLE D/C IN 2-3 DAYS, DEPENDING ON CLINICAL CONDITION.   Demetrios Loll M.D on 11/22/2017 at 5:07 PM  Between 7am to 6pm - Pager - [phone]  After 6pm go to [email or web address] - [facility]

## 2017-11-23 LAB — BASIC METABOLIC PANEL
ANION GAP: 4 — AB (ref 5–15)
BUN: 48 mg/dL — ABNORMAL HIGH (ref 6–20)
CALCIUM: 8.1 mg/dL — AB (ref 8.9–10.3)
CO2: 22 mmol/L (ref 22–32)
Chloride: 107 mmol/L (ref 101–111)
Creatinine, Ser: 1.33 mg/dL — ABNORMAL HIGH (ref 0.61–1.24)
GFR, EST AFRICAN AMERICAN: 59 mL/min — AB (ref >60)
GFR, EST NON AFRICAN AMERICAN: 51 mL/min — AB (ref >60)
Glucose, Bld: 222 mg/dL — ABNORMAL HIGH (ref 65–99)
POTASSIUM: 4.9 mmol/L (ref 3.5–5.1)
Sodium: 133 mmol/L — ABNORMAL LOW (ref 135–145)

## 2017-11-23 LAB — HEMOGLOBIN: HEMOGLOBIN: 7 g/dL — AB (ref 13.0–18.0)

## 2017-11-23 LAB — CELIAC DISEASE PANEL
Endomysial Ab, IgA: NEGATIVE
IgA: 138 mg/dL (ref 61–437)
Tissue Transglutaminase Ab, IgA: <2 U/mL (ref 0–3)

## 2017-11-23 LAB — GLUCOSE, CAPILLARY
GLUCOSE-CAPILLARY: 241 mg/dL — AB (ref 65–99)
GLUCOSE-CAPILLARY: 179 mg/dL — AB (ref 65–99)
Glucose-Capillary: 285 mg/dL — ABNORMAL HIGH (ref 65–99)
Glucose-Capillary: 231 mg/dL — ABNORMAL HIGH (ref 65–99)

## 2017-11-23 LAB — HEMOGLOBIN AND HEMATOCRIT, BLOOD
HCT: 19.7 % — ABNORMAL LOW (ref 40.0–52.0)
HEMATOCRIT: 21.1 % — AB (ref 40.0–52.0)
HEMOGLOBIN: 7.3 g/dL — AB (ref 13.0–18.0)
Hemoglobin: 6.8 g/dL — ABNORMAL LOW (ref 13.0–18.0)

## 2017-11-23 LAB — PREPARE RBC (CROSSMATCH)

## 2017-11-23 MED ORDER — CARVEDILOL 3.125 MG PO TABS
12.5000 mg | ORAL_TABLET | Freq: Two times a day (BID) | ORAL | Status: DC
  Administered 2017-11-23 – 2017-11-29 (×11): 12.5 mg via ORAL
  Filled 2017-11-23 (×13): qty 4

## 2017-11-23 MED ORDER — METOCLOPRAMIDE HCL 5 MG/ML IJ SOLN
5.0000 mg | Freq: Three times a day (TID) | INTRAMUSCULAR | Status: DC | PRN
  Administered 2017-11-23: 14:00:00 5 mg via INTRAVENOUS
  Filled 2017-11-23: qty 2

## 2017-11-23 MED ORDER — SODIUM CHLORIDE 0.9 % IV SOLN
Freq: Once | INTRAVENOUS | Status: AC
  Administered 2017-11-23: 14:00:00 via INTRAVENOUS

## 2017-11-23 MED ORDER — INSULIN GLARGINE 100 UNIT/ML ~~LOC~~ SOLN
10.0000 [IU] | Freq: Every day | SUBCUTANEOUS | Status: DC
  Administered 2017-11-23 – 2017-11-29 (×5): 10 [IU] via SUBCUTANEOUS
  Filled 2017-11-23 (×8): qty 0.1

## 2017-11-23 NOTE — Plan of Care (Signed)
  Problem: Education: Goal: Knowledge of General Education information will improve Outcome: Progressing   Problem: Health Behavior/Discharge Planning: Goal: Ability to manage health-related needs will improve Outcome: Progressing   Problem: Clinical Measurements: Goal: Ability to maintain clinical measurements within normal limits will improve Outcome: Progressing Goal: Will remain free from infection Outcome: Progressing   Problem: Skin Integrity: Goal: Risk for impaired skin integrity will decrease Outcome: Progressing   Problem: Education: Goal: Ability to identify signs and symptoms of gastrointestinal bleeding will improve Outcome: Progressing

## 2017-11-23 NOTE — Progress Notes (Addendum)
[place] at [place] NAME: Jay Morris    MR#:  [id number]  DATE OF BIRTH:  1946/04/16   no further melena.  Hemoglobin 7, dropped from 9.2.  CHIEF COMPLAINT:   Chief Complaint  Patient presents with  . Hypotension  . Dizziness   Dizziness when stands up and generalized weakness.  No melena or bloody stool today. REVIEW OF SYSTEMS:  Review of Systems  Constitutional: Positive for malaise/fatigue. Negative for chills and fever.  HENT: Negative for sore throat.   Eyes: Negative for blurred vision and double vision.  Respiratory: Negative for cough, hemoptysis, shortness of breath, wheezing and stridor.   Cardiovascular: Negative for chest pain, palpitations, orthopnea and leg swelling.  Gastrointestinal: Negative for abdominal pain, blood in stool, diarrhea, melena, nausea and vomiting.  Genitourinary: Negative for dysuria, flank pain and hematuria.  Musculoskeletal: Negative for back pain and joint pain.  Skin: Negative for rash.  Neurological: Positive for dizziness. Negative for sensory change, focal weakness, seizures, loss of consciousness, weakness and headaches.  Endo/Heme/Allergies: Negative for polydipsia.  Psychiatric/Behavioral: Negative for depression. The patient is not nervous/anxious.     DRUG ALLERGIES:   Allergies  Allergen Reactions  . Shrimp [Shellfish Allergy] Anaphylaxis   VITALS:  Blood pressure (!) 150/58, pulse 81, temperature 98.4 F (36.9 C), temperature source Oral, resp. rate 18, height 5' 8" (1.727 m), weight 90 kg (198 lb 8 oz), SpO2 97 %. PHYSICAL EXAMINATION:  Physical Exam  Constitutional: He is oriented to person, place, and time.  HENT:  Head: Normocephalic.  Mouth/Throat: Oropharynx is clear and moist.  Eyes: Pupils are equal, round, and reactive to light. Conjunctivae and EOM are normal. No scleral icterus.  Neck: Normal range of motion. Neck supple. No JVD present. No tracheal deviation  present.  Cardiovascular: Normal rate, regular rhythm and normal heart sounds. Exam reveals no gallop.  No murmur heard. Pulmonary/Chest: Effort normal and breath sounds normal. No respiratory distress. He has no wheezes. He has no rales.  Abdominal: Soft. Bowel sounds are normal. He exhibits no distension. There is no tenderness. There is no rebound.  Musculoskeletal: Normal range of motion. He exhibits no edema or tenderness.  Neurological: He is alert and oriented to person, place, and time. No cranial nerve deficit.  Skin: No rash noted. No erythema.   LABORATORY PANEL:  Male CBC Recent Labs  Lab 11/22/17 0558  11/23/17 1246  WBC 8.6  --   --   HGB 9.2*   < > 6.8*  HCT 25.6*  --  19.7*  PLT 174  --   --    < > = values in this interval not displayed.   ------------------------------------------------------------------------------------------------------------------ Chemistries  Recent Labs  Lab 11/21/17 1113  11/23/17 0515  NA 136   < > 133*  K 4.9   < > 4.9  CL 109   < > 107  CO2 20*   < > 22  GLUCOSE 86   < > 222*  BUN 80*   < > 48*  CREATININE 1.82*   < > 1.33*  CALCIUM 8.4*   < > 8.1*  AST 17  --   --   ALT 18  --   --   ALKPHOS 17*  --   --   BILITOT 0.6  --   --    < > = values in this interval not displayed.   RADIOLOGY:  No results found. ASSESSMENT AND PLAN:  72 year old male with past medical history of atrial fibrillation, hypertension, hyperlipidemia, COPD, diabetes who presents to the hospital due to dizziness, generalized weakness and black stools noted to be anemic.  1.  GI bleed- suspected to be upper GI bleed given the patient's melanotic stools.    Hemoglobin again dropped from 9.2-7, transfuse 1 more unit of packed RBC.  Discussed with gastroenterology Dr. Vicente Males.  Patient will be having EGD tomorrow.  N.p.o. after midnight.  Continue clear liquids.  Continue Protonix drip.    2.  Anemia-this is acute blood loss anemia secondary to GI  bleed. Sj/p 2 units of packed red blood cells.    Hemoglobin dropped again, transfuse 1 more unit of packed RBC.  3.  Diabetes type 2 without complication, started back on low-dose Lantus.  on sliding scale insulin. Patient is having nausea, continue PPI, added Reglan in addition to Zofran. 4.  History of chronic atrial fibrillation-rate controlled.  Continue carvedilol.  Hold Pradaxa given the anemia/GI bleed.  5.  BPH-continue Flomax.  6.  COPD-no acute exacerbation-continue Brio Ellipta  7.  History of gout-no acute attack.  Continue allopurinol.  8.  Hyperlipidemia-continue atorvastatin.  ARF on CKD stage 3.,  Creatinine 1.33.  N.p.o. after midnight, restart IV fluids.  Discussed with patient's wife. All the records are reviewed and case discussed with Care Management/Social Worker. Management plans discussed with the patient, family and they are in agreement.  CODE STATUS: Full Code  TOTAL TIME TAKING CARE OF THIS PATIENT: 36 minutes.   More than 50% of the time was spent in counseling/coordination of care: YES  POSSIBLE D/C IN 2-3 DAYS, DEPENDING ON CLINICAL CONDITION.   Epifanio Lesches M.D on 11/23/2017 at 2:09 PM  Between 7am to 6pm - Pager - [phone]  After 6pm go to [email or web address] - [facility]

## 2017-11-23 NOTE — Progress Notes (Signed)
Inpatient Diabetes Program Recommendations  AACE/ADA: New Consensus Statement on Inpatient Glycemic Control (2015)  Target Ranges:  Prepandial:   less than 140 mg/dL      Peak postprandial:   less than 180 mg/dL (1-2 hours)      Critically ill patients:  140 - 180 mg/dL   Results for Jay Morris, Jay Morris (MRN [id number]) as of 11/23/2017 10:07  Ref. Range 11/22/2017 07:26 11/22/2017 12:34 11/22/2017 17:22 11/22/2017 20:49 11/23/2017 07:39  Glucose-Capillary Latest Ref Range: 65 - 99 mg/dL 156 (H) 202 (H) 172 (H) 207 (H) 241 (H)   Review of Glycemic Control  Diabetes history: DM2 Outpatient Diabetes medications: Tresiba 100 units QHS, Novolog 25 units with breakfast, 15 units with lunch, 25 units with supper, Metformin 1000 mg BID Current orders for Inpatient glycemic control: Novolog 0-9 units TID with meals, Novolog 0-5 units QHS  Inpatient Diabetes Program Recommendations: Insulin - Basal: Please consider ordering Lantus 10 units Q24H starting now (based on 90 kg x 0.1 units).  Thanks, Barnie Alderman, RN, MSN, CDE Diabetes Coordinator Inpatient Diabetes Program [phone] (Team Pager from 8am to 5pm)

## 2017-11-23 NOTE — Progress Notes (Signed)
Jay Morris , MD [address] [place], [place], [place], [address] [address], [place], [place], [place], [address] Phone: [phone]  Fax: [phone]   Jay Morris is being followed for Melena   Subjective: Last bowel movement was yesterday and was black, no abdominal pain , no hematemesis   Objective: Vital signs in last 24 hours: Vitals:   11/22/17 0658 11/22/17 1234 11/22/17 1942 11/23/17 0447  BP: (!) 123/50 (!) 118/51 (!) 129/49 (!) 133/56  Pulse: 73 67 76 79  Resp:  _0 Temp:  97.8 F (36.6 C) 98 F (36.7 C) 98.2 F (36.8 C)  TempSrc:  Oral Oral Oral  SpO2: 95% 96% 97% 96%  Weight:      Height:       Weight change:   Intake/Output Summary (Last 24 hours) at 11/23/2017 1248 Last data filed at 11/23/2017 1057 Gross per 24 hour  Intake 1351.25 ml  Output 650 ml  Net 701.25 ml     Exam: Heart:: Regular rate and rhythm, S1S2 present or without murmur or extra heart sounds Lungs: normal, clear to auscultation and clear to auscultation and percussion Abdomen: soft, nontender, normal bowel sounds   Lab Results: _1 @ Micro Results: No results found for this or any previous visit (from the past 240 hour(s)). Studies/Results: Ct Abdomen Pelvis W Contrast  Result Date: 11/21/2017 CLINICAL DATA:  Dizziness and left lower quadrant pain EXAM: CT ABDOMEN AND PELVIS WITH CONTRAST TECHNIQUE: Multidetector CT imaging of the abdomen and pelvis was performed using the standard protocol following bolus administration of intravenous contrast. CONTRAST:  28m [id number] COMPARISON:  None. FINDINGS: Lower chest: Lung bases demonstrate some minimal scarring in the lingula. No focal infiltrate or effusion is seen. Hepatobiliary: No focal liver abnormality is seen. No gallstones, gallbladder wall thickening, or biliary dilatation. Pancreas: Unremarkable. No pancreatic ductal dilatation or surrounding inflammatory changes. Spleen: Normal in size without focal  abnormality. Adrenals/Urinary Tract: Adrenal glands are unremarkable. Kidneys are normal, without renal calculi, focal lesion, or hydronephrosis. Bladder is unremarkable. Stomach/Bowel: Stomach is within normal limits. Appendix appears normal. No evidence of bowel wall thickening, distention, or inflammatory changes. Vascular/Lymphatic: Aortic atherosclerosis. No enlarged abdominal or pelvic lymph nodes. Reproductive: Prostate is unremarkable. Other: No abdominal wall hernia or abnormality. No abdominopelvic ascites. Musculoskeletal: Degenerative changes of lumbar spine are noted. IMPRESSION: No acute abnormality noted. Electronically Signed   By: MInez CatalinaM.D.   On: 11/21/2017 13:16   Medications: I have reviewed the patient's current medications. Scheduled Meds: . allopurinol  100 mg Oral Daily  . atorvastatin  20 mg Oral QHS  . carvedilol  12.5 mg Oral BID WC  . fluticasone furoate-vilanterol  1 puff Inhalation Daily  . insulin aspart  0-5 Units Subcutaneous QHS  . insulin aspart  0-9 Units Subcutaneous TID WC  . insulin glargine  10 Units Subcutaneous Daily  . omega-3 acid ethyl esters  4 g Oral Daily  . tamsulosin  0.4 mg Oral QPM   Continuous Infusions: . sodium chloride 50 mL/hr at 11/23/17 0426  . sodium chloride    . pantoprozole (PROTONIX) infusion 8 mg/hr (11/23/17 0426)   PRN Meds:.acetaminophen **OR** acetaminophen, metoCLOPramide (REGLAN) injection, ondansetron **OR** ondansetron (ZOFRAN) IV  CBC Latest Ref Rng & Units 11/23/2017 11/22/2017 11/21/2017  WBC 3.8 - 10.6 K/uL - 8.6 -  Hemoglobin 13.0 - 18.0 g/dL 7.0(L) 9.2(L) 9.2(L)  Hematocrit 40.0 - 52.0 % - 25.6(L) -  Platelets 150 - 440 K/uL - 174 -  Assessment: Active Problems:   GI bleed Jay Morris is a 72 y.o. y/o male with atrial fibrillation who has been evaluated in November 2018 by Dr. Alice Reichert at [facility] for iron deficiency anemia.  Upper endoscopy and colonoscopy were noncontributory and showed no  signs of bleeding.  No capsule study of the small bowel was performed.  The patient has been on Pradaxa.  Admitted with an acute drop in hemoglobin from 12 g to 7.5 g and further drop to 7 grams today .  Black-colored stools for a few days.   Plan 1.  Transfuse to target hemoglobin. 2.  EGD tomorrow (2 days off Pradaxa) 3.  Low b12 suggest IM replacement.  4.  PPI   I have discussed alternative options, risks & benefits,  which include, but are not limited to, bleeding, infection, perforation,respiratory complication & drug reaction.  The patient agrees with this plan & written consent will be obtained.      LOS: 2 days   Jay Bellows, MD 11/23/2017, 12:48 PM

## 2017-11-24 ENCOUNTER — Inpatient Hospital Stay: Payer: Medicare Other | Admitting: Anesthesiology

## 2017-11-24 ENCOUNTER — Encounter: Payer: Self-pay | Admitting: Anesthesiology

## 2017-11-24 ENCOUNTER — Encounter
Admission: EM | Disposition: A | Discharge status: Home / Self Care | Payer: Self-pay | Source: Home / Self Care | Attending: Internal Medicine

## 2017-11-24 HISTORY — PX: ENTEROSCOPY: SHX5533

## 2017-11-24 LAB — GLUCOSE, CAPILLARY
GLUCOSE-CAPILLARY: 171 mg/dL — AB (ref 65–99)
GLUCOSE-CAPILLARY: 181 mg/dL — AB (ref 65–99)
Glucose-Capillary: 157 mg/dL — ABNORMAL HIGH (ref 65–99)
Glucose-Capillary: 137 mg/dL — ABNORMAL HIGH (ref 65–99)

## 2017-11-24 LAB — PREPARE RBC (CROSSMATCH)

## 2017-11-24 SURGERY — ENTEROSCOPY
Anesthesia: General

## 2017-11-24 MED ORDER — PROPOFOL 10 MG/ML IV BOLUS
INTRAVENOUS | Status: DC | PRN
  Administered 2017-11-24: 70 mg via INTRAVENOUS

## 2017-11-24 MED ORDER — PANTOPRAZOLE SODIUM 40 MG PO TBEC
40.0000 mg | DELAYED_RELEASE_TABLET | Freq: Two times a day (BID) | ORAL | Status: DC
  Administered 2017-11-24 – 2017-11-29 (×9): 40 mg via ORAL
  Filled 2017-11-24 (×9): qty 1

## 2017-11-24 MED ORDER — PHENYLEPHRINE HCL 10 MG/ML IJ SOLN
INTRAMUSCULAR | Status: DC | PRN
  Administered 2017-11-24: 100 ug via INTRAVENOUS

## 2017-11-24 MED ORDER — ORAL CARE MOUTH RINSE
15.0000 mL | Freq: Two times a day (BID) | OROMUCOSAL | Status: DC
  Administered 2017-11-24 – 2017-11-29 (×9): 15 mL via OROMUCOSAL

## 2017-11-24 MED ORDER — CHLORHEXIDINE GLUCONATE 0.12 % MT SOLN
15.0000 mL | Freq: Two times a day (BID) | OROMUCOSAL | Status: DC
  Administered 2017-11-24 – 2017-11-29 (×10): 15 mL via OROMUCOSAL
  Filled 2017-11-24 (×10): qty 15

## 2017-11-24 MED ORDER — SODIUM CHLORIDE 0.9 % IV SOLN
Freq: Once | INTRAVENOUS | Status: AC
  Administered 2017-11-24: 19:00:00 via INTRAVENOUS

## 2017-11-24 MED ORDER — PROPOFOL 500 MG/50ML IV EMUL
INTRAVENOUS | Status: DC | PRN
  Administered 2017-11-24: 140 ug/kg/min via INTRAVENOUS

## 2017-11-24 NOTE — H&P (Signed)
Jonathon Bellows, MD [address], [place], [place], [place], [address] [address] [place], [place], [place], [address] Phone: [phone]  Fax: [phone]  Primary Care Physician:  [name], MD   Pre-Procedure History & Physical: HPI:  Jay Morris is a 72 y.o. male is here for an endoscopy    Past Medical History:  Diagnosis Date  . Asthma   . Atrial fibrillation ([facility])   . BPH (benign prostatic hyperplasia)   . COPD (chronic obstructive pulmonary disease) ([facility])   . Diabetes mellitus without complication ([facility])   . Helicobacter pylori gastritis   . Hyperlipidemia   . Hypertension   . Pneumonia     Past Surgical History:  Procedure Laterality Date  . CARDIAC ELECTROPHYSIOLOGY MAPPING AND ABLATION    . COLONOSCOPY WITH PROPOFOL N/A 06/15/2017   Procedure: COLONOSCOPY WITH PROPOFOL;  Surgeon: Toledo, Benay Pike, MD;  Location: [facility] ENDOSCOPY;  Service: Gastroenterology;  Laterality: N/A;  . ESOPHAGOGASTRODUODENOSCOPY (EGD) WITH PROPOFOL N/A 06/02/2017   Procedure: ESOPHAGOGASTRODUODENOSCOPY (EGD) WITH PROPOFOL;  Surgeon: Toledo, Benay Pike, MD;  Location: [facility] ENDOSCOPY;  Service: Gastroenterology;  Laterality: N/A;  . HAND SURGERY      Prior to Admission medications   Medication Sig Start Date End Date Taking? Authorizing Provider  allopurinol (ZYLOPRIM) 100 MG tablet Take 100 mg by mouth daily.   Yes [provider]  atorvastatin (LIPITOR) 20 MG tablet Take 20 mg by mouth at bedtime. 12/09/15  Yes [provider]  BREO ELLIPTA 100-25 MCG/INH AEPB Inhale 1 puff into the lungs daily. 12/04/15  Yes [provider]  carvedilol (COREG) 6.25 MG tablet Take 12.5 mg by mouth 2 (two) times daily. 11/02/17  Yes [provider]  furosemide (LASIX) 20 MG tablet Take 20 mg by mouth daily. 12/29/15  Yes [provider]  indomethacin (INDOCIN) 50 MG capsule Take 50 mg by mouth 3 (three) times daily as needed (gout).   Yes  [provider]  insulin aspart (NOVOLOG) 100 UNIT/ML injection Inject 15-25 Units into the skin 3 (three) times daily before meals. Inject 25 units before breakfast, 15 units before lunch and 25 units before dinner.   Yes [provider]  insulin degludec (TRESIBA FLEXTOUCH) 100 UNIT/ML SOPN FlexTouch Pen Inject 100 Units into the skin daily at 10 pm.    Yes [provider]  lisinopril (PRINIVIL,ZESTRIL) 40 MG tablet Take 40 mg by mouth at bedtime.  12/29/15  Yes [provider]  metFORMIN (GLUCOPHAGE) 1000 MG tablet Take 1,000 mg by mouth 2 (two) times daily. 12/17/15  Yes [provider]  omega-3 acid ethyl esters (LOVAZA) 1 g capsule Take 4 g by mouth daily. 12/29/15  Yes [provider]  PRADAXA 150 MG CAPS capsule Take 150 mg by mouth 2 (two) times daily. 12/11/15  Yes [provider]  spironolactone (ALDACTONE) 25 MG tablet Take 25 mg daily by mouth.   Yes [provider]  tamsulosin (FLOMAX) 0.4 MG CAPS capsule Take 0.4 mg by mouth every evening.  12/17/15  Yes [provider]  traMADol (ULTRAM) 50 MG tablet Take 1 tablet (50 mg total) by mouth every 6 (six) hours as needed for moderate pain. Patient not taking: Reported on 06/02/2017 01/09/16   Hillary Bow, MD    Allergies as of 11/21/2017 - Review Complete 11/21/2017  Allergen Reaction Noted  . Shrimp [shellfish allergy] Anaphylaxis 11/21/2017    Family History  Problem Relation Age of Onset  . Heart  disease Mother   . Cancer Father     Social History   Socioeconomic History  . Marital status: Married    Spouse name: Not on file  . Number of children: Not on file  . Years of education: Not on file  . Highest education level: Not on file  Occupational History  . Not on file  Social Needs  . Financial resource strain: Not on file  . Food insecurity:    Worry: Not on file    Inability: Not on file  . Transportation needs:    Medical: Not on  file    Non-medical: Not on file  Tobacco Use  . Smoking status: Former Smoker    Packs/day: 1.00    Years: 30.00    Pack years: 30.00    Types: Cigarettes  . Smokeless tobacco: Never Used  Substance and Sexual Activity  . Alcohol use: No    Alcohol/week: 0.0 oz    Comment: occasional  . Drug use: No  . Sexual activity: Yes  Lifestyle  . Physical activity:    Days per week: Not on file    Minutes per session: Not on file  . Stress: Not on file  Relationships  . Social connections:    Talks on phone: Not on file    Gets together: Not on file    Attends religious service: Not on file    Active member of club or organization: Not on file    Attends meetings of clubs or organizations: Not on file    Relationship status: Not on file  . Intimate partner violence:    Fear of current or ex partner: Not on file    Emotionally abused: Not on file    Physically abused: Not on file    Forced sexual activity: Not on file  Other Topics Concern  . Not on file  Social History Narrative  . Not on file    Review of Systems: See HPI, otherwise negative ROS  Physical Exam: BP (!) 108/48   Pulse 75   Temp 98.2 F (36.8 C) (Tympanic)   Resp 18   Ht _0  (1.727 m)   Wt 198 lb 8 oz (90 kg)   SpO2 94%   BMI 30.18 kg/m  General:   Alert,  pleasant and cooperative in NAD Head:  Normocephalic and atraumatic. Neck:  Supple; no masses or thyromegaly. Lungs:  Clear throughout to auscultation, normal respiratory effort.    Heart:  +S1, +S2, Regular rate and rhythm, No edema. Abdomen:  Soft, nontender and nondistended. Normal bowel sounds, without guarding, and without rebound.   Neurologic:  Alert and  oriented x4;  grossly normal neurologically.  Impression/Plan: Jay Morris is here for an endoscopy  to be performed for  evaluation of GI bleed     Risks, benefits, limitations, and alternatives regarding endoscopy have been reviewed with the patient.  Questions have been  answered.  All parties agreeable.   Jonathon Bellows, MD  11/24/2017, 11:10 AM

## 2017-11-24 NOTE — Progress Notes (Signed)
[facility] [place] NAME: Jay Morris    MR#:  [id number]  DATE OF BIRTH:  1946/05/30  SUBJECTIVE:   Presented to the hospital due to hypotension, dizziness and noted to be anemic and also noted to have melanotic stools.  Suspected to have an upper GI bleed.  Status post upper GI endoscopy today showing no acute pathology, capsule study has been initiated.  Hemoglobin down to 7.3 and will transfuse 1 more unit today.  Patient still feels somewhat weak and has exertional dyspnea.  REVIEW OF SYSTEMS:    Review of Systems  Constitutional: Negative for chills and fever.  HENT: Negative for congestion and tinnitus.   Eyes: Negative for blurred vision and double vision.  Respiratory: Negative for cough, shortness of breath and wheezing.   Cardiovascular: Negative for chest pain, orthopnea and PND.  Gastrointestinal: Negative for abdominal pain, diarrhea, nausea and vomiting.  Genitourinary: Negative for dysuria and hematuria.  Neurological: Negative for dizziness, sensory change and focal weakness.  All other systems reviewed and are negative.   Nutrition: Clear liquid Tolerating Diet: Yes Tolerating PT: Await Eval.  DRUG ALLERGIES:   Allergies  Allergen Reactions  . Shrimp [Shellfish Allergy] Anaphylaxis    VITALS:  Blood pressure (!) 112/57, pulse 74, temperature 98.1 F (36.7 C), temperature source Oral, resp. rate 20, height _0  (1.727 m), weight 90 kg (198 lb 8 oz), SpO2 95 %.  PHYSICAL EXAMINATION:   Physical Exam  GENERAL:  72 y.o.-year-old patient lying in bed in no acute distress.  EYES: Pupils equal, round, reactive to light and accommodation. No scleral icterus. Extraocular muscles intact.  HEENT: Head atraumatic, normocephalic. Oropharynx and nasopharynx clear.  NECK:  Supple, no jugular venous distention. No thyroid enlargement, no tenderness.  LUNGS: Normal breath sounds bilaterally, no wheezing, rales, rhonchi. No  use of accessory muscles of respiration.  CARDIOVASCULAR: S1, S2 normal. No murmurs, rubs, or gallops.  ABDOMEN: Soft, nontender, nondistended. Bowel sounds present. No organomegaly or mass.  EXTREMITIES: No cyanosis, clubbing or edema b/l.    NEUROLOGIC: Cranial nerves II through XII are intact. No focal Motor or sensory deficits b/l.   PSYCHIATRIC: The patient is alert and oriented x 3.  SKIN: No obvious rash, lesion, or ulcer.    LABORATORY PANEL:   CBC Recent Labs  Lab 11/22/17 0558  11/23/17 1826  WBC 8.6  --   --   HGB 9.2*   < > 7.3*  HCT 25.6*   < > 21.1*  PLT 174  --   --    < > = values in this interval not displayed.   ------------------------------------------------------------------------------------------------------------------  Chemistries  Recent Labs  Lab 11/21/17 1113  11/23/17 0515  NA 136   < > 133*  K 4.9   < > 4.9  CL 109   < > 107  CO2 20*   < > 22  GLUCOSE 86   < > 222*  BUN 80*   < > 48*  CREATININE 1.82*   < > 1.33*  CALCIUM 8.4*   < > 8.1*  AST 17  --   --   ALT 18  --   --   ALKPHOS 17*  --   --   BILITOT 0.6  --   --    < > = values in this interval not displayed.   ------------------------------------------------------------------------------------------------------------------  Cardiac Enzymes Recent Labs  Lab 11/21/17 1113  TROPONINI <0.03   ------------------------------------------------------------------------------------------------------------------  RADIOLOGY:  No results found.   ASSESSMENT AND PLAN:   72 year old male with past medical history of atrial fibrillation, hypertension, hyperlipidemia, COPD, diabetes who presents to the hospital due to dizziness, generalized weakness and black stools noted to be anemic.  1.  GI bleed- suspected to be upper GI bleed given the patient's melanotic stools.  Patient's hemoglobin is down to 7.5 with a baseline hemoglobin in January of this past year being around 62. -Patient  was transfused 2 units of packed red blood cells and hemoglobin improved to 9 but has dropped down to 7.3 today. - Seen by gastroenterology and status post endoscopy showing no acute pathology.  Plan is to get a capsule endoscopy which has been started. - We will DC Protonix drip placed on p.o. Protonix twice daily.  Continue clear liquid diet and advance as tolerated as per gastroenterology.  Continue to hold Pradaxa for now.  2.  Anemia-this is acute blood loss anemia secondary to GI bleed. - Patient's hemoglobin improved to 9.2 posttransfusion but has dropped down to 7.3 again and will give one more unit today.  - follow Hg.    3.  Diabetes type 2 without complication- cont. Lantus, SSI.  BS stable.   4.  History of chronic atrial fibrillation-rate controlled.  Continue carvedilol.  Hold Pradaxa given the anemia/GI bleed.  5.  BPH-continue Flomax.  6.  COPD-no acute exacerbation-continue Brio Ellipta  7.  History of gout-no acute attack.  Continue allopurinol.  8.  Hyperlipidemia-continue atorvastatin.   All the records are reviewed and case discussed with Care Management/Social Worker. Management plans discussed with the patient, family and they are in agreement.  CODE STATUS: Full code  DVT Prophylaxis: Ted's & SCD's  TOTAL TIME TAKING CARE OF THIS PATIENT: 30 minutes.   POSSIBLE D/C IN 2-3 DAYS, DEPENDING ON CLINICAL CONDITION.   Henreitta Leber M.D on 11/24/2017 at 3:56 PM  Between 7am to 6pm - Pager - [phone]  After 6pm go to [email or web address] - Proofreader  [facility]  Office  [phone]  CC: Primary care physician; [name], MD

## 2017-11-24 NOTE — Transfer of Care (Signed)
Immediate Anesthesia Transfer of Care Note  Patient: Jay Morris  Procedure(s) Performed: ENTEROSCOPY (N/A )  Patient Location: PACU  Anesthesia Type:General  Level of Consciousness: awake, alert  and oriented  Airway & Oxygen Therapy: Patient Spontanous Breathing and Patient connected to nasal cannula oxygen  Post-op Assessment: Report given to RN and Post -op Vital signs reviewed and stable  Post vital signs: Reviewed and stable  Last Vitals:  Vitals Value Taken Time  BP 120/54 11/24/2017 11:30 AM  Temp 36.2 C 11/24/2017 11:30 AM  Pulse 75 11/24/2017 11:30 AM  Resp 18 11/24/2017 11:30 AM  SpO2 99 % 11/24/2017 11:30 AM  Vitals shown include unvalidated device data.  Last Pain:  Vitals:   11/24/17 1120  TempSrc: Tympanic  PainSc:       Patients Stated Pain Goal: 0 (54/27/06 2376)  Complications: No apparent anesthesia complications

## 2017-11-24 NOTE — Anesthesia Preprocedure Evaluation (Signed)
Anesthesia Evaluation  Patient identified by MRN, date of birth, ID band Patient awake    Reviewed: Allergy & Precautions, NPO status , Patient's Chart, lab work & pertinent test results, reviewed documented beta blocker date and time   History of Anesthesia Complications Negative for: history of anesthetic complications  Airway Mallampati: II  TM Distance: <3 FB     Dental  (+) Upper Dentures, Dental Advidsory Given   Pulmonary asthma , pneumonia, resolved, COPD, former smoker,    Pulmonary exam normal        Cardiovascular hypertension, Pt. on medications and Pt. on home beta blockers Normal cardiovascular exam+ dysrhythmias Atrial Fibrillation      Neuro/Psych negative neurological ROS  negative psych ROS   GI/Hepatic Neg liver ROS,   Endo/Other  diabetes  Renal/GU negative Renal ROS  negative genitourinary   Musculoskeletal negative musculoskeletal ROS (+)   Abdominal Normal abdominal exam  (+)   Peds negative pediatric ROS (+)  Hematology negative hematology ROS (+)   Anesthesia Other Findings Past Medical History: No date: Asthma No date: Atrial fibrillation ([facility]) No date: BPH (benign prostatic hyperplasia) No date: COPD (chronic obstructive pulmonary disease) ([facility]) No date: Diabetes mellitus without complication ([facility]) No date: Helicobacter pylori gastritis No date: Hyperlipidemia No date: Hypertension No date: Pneumonia   Reproductive/Obstetrics                             Anesthesia Physical  Anesthesia Plan  ASA: III  Anesthesia Plan: General   Post-op Pain Management:    Induction: Intravenous  PONV Risk Score and Plan: 2 and Propofol infusion  Airway Management Planned: Nasal Cannula  Additional Equipment:   Intra-op Plan:   Post-operative Plan:   Informed Consent: I have reviewed the patients History and Physical, chart, labs and discussed the procedure  including the risks, benefits and alternatives for the proposed anesthesia with the patient or authorized representative who has indicated his/her understanding and acceptance.   Dental advisory given  Plan Discussed with: CRNA and Surgeon  Anesthesia Plan Comments:         Anesthesia Quick Evaluation

## 2017-11-24 NOTE — Op Note (Signed)
[facility] Gastroenterology Patient Name: Jay Morris Procedure Date: 11/24/2017 11:09 AM MRN: [id number] Account #: [id number] Date of Birth: Dec 26, 1945 Admit Type: Inpatient Age: 72 Room: [facility] ENDO ROOM 2 Gender: Male Note Status: Finalized Procedure:            Small bowel enteroscopy Indications:          GI bleeding source not documented by previous EGD and                        colonoscopy Providers:            Jonathon Bellows MD, MD Referring MD:         Leonie Douglas. Doy Hutching, MD (Referring MD) Medicines:            Monitored Anesthesia Care Complications:        No immediate complications. Procedure:            Pre-Anesthesia Assessment:                       - Prior to the procedure, a History and Physical was                        performed, and patient medications, allergies and                        sensitivities were reviewed. The patient's tolerance of                        previous anesthesia was reviewed.                       - The risks and benefits of the procedure and the                        sedation options and risks were discussed with the                        patient. All questions were answered and informed                        consent was obtained.                       - ASA Grade Assessment: III - A patient with severe                        systemic disease.                       After obtaining informed consent, the endoscope was                        passed under direct vision. Throughout the procedure,                        the patient's blood pressure, pulse, and oxygen                        saturations were monitored continuously. The  Colonoscope was introduced through the mouth and                        advanced to the mid-jejunum. The small bowel                        enteroscopy was accomplished with ease. The patient                        tolerated the procedure well. Findings:      The  esophagus was normal.      The stomach was normal.      The examined duodenum was normal.      There was no evidence of significant pathology in the entire examined       portion of jejunum.      Pediatric colonoscope was intriduced till 160 cm mark with no       significant twists or loops. Impression:           - Normal esophagus.                       - Normal stomach.                       - Normal examined duodenum.                       - The examined portion of the jejunum was normal.                       - No specimens collected. Recommendation:       - Return patient to hospital ward for ongoing care.                       - To visualize the small bowel, perform video capsule                        endoscopy today. Procedure Code(s):    --- Professional ---                       [phone], Small intestinal endoscopy, enteroscopy beyond                        second portion of duodenum, not including ileum;                        diagnostic, including collection of specimen(s) by                        brushing or washing, when performed (separate procedure) Diagnosis Code(s):    --- Professional ---                       K92.2, Gastrointestinal hemorrhage, unspecified CPT copyright 2017 American Medical Association. All rights reserved. The codes documented in this report are preliminary and upon coder review may  be revised to meet current compliance requirements. Jonathon Bellows, MD Jonathon Bellows MD, MD 11/24/2017 11:24:31 AM This report has been signed electronically. Number of Addenda: 0 Note Initiated On: 11/24/2017 11:09 AM      [facility]

## 2017-11-24 NOTE — OR Nursing (Signed)
Capsule study began at 1200 pm.

## 2017-11-24 NOTE — Anesthesia Post-op Follow-up Note (Signed)
Anesthesia QCDR form completed.        

## 2017-11-24 NOTE — Plan of Care (Signed)
Pt s/p EGD. Capsule endoscopy belt to be removed at 8:00pm. Pt tolearates clear liquid diet. Denies pain/n/v. No stools today. Hgb 7.3. 1xunit of RBC's initiated.

## 2017-11-25 ENCOUNTER — Inpatient Hospital Stay: Payer: Medicare Other

## 2017-11-25 ENCOUNTER — Encounter
Admission: EM | Disposition: A | Discharge status: Home / Self Care | Payer: Self-pay | Source: Home / Self Care | Attending: Internal Medicine

## 2017-11-25 DIAGNOSIS — K922 Gastrointestinal hemorrhage, unspecified: Secondary | ICD-10-CM

## 2017-11-25 HISTORY — PX: GIVENS CAPSULE STUDY: SHX5432

## 2017-11-25 LAB — BPAM RBC
BLOOD PRODUCT EXPIRATION DATE: 201905192359
BLOOD PRODUCT EXPIRATION DATE: 201905272359
Blood Product Expiration Date: 201905242359
Blood Product Expiration Date: 201905282359
ISSUE DATE / TIME: 201905061340
ISSUE DATE / TIME: 201905061637
ISSUE DATE / TIME: 201905091900
ISSUE DATE / TIME: 201905081425
UNIT TYPE AND RH: 5100
UNIT TYPE AND RH: 6200
Unit Type and Rh: 5100
Unit Type and Rh: 600

## 2017-11-25 LAB — TYPE AND SCREEN
ABO/RH(D): A POS
ANTIBODY SCREEN: NEGATIVE
UNIT DIVISION: 0
Unit division: 0
Unit division: 0
Unit division: 0

## 2017-11-25 LAB — CBC
HEMATOCRIT: 22.4 % — AB (ref 40.0–52.0)
Hemoglobin: 7.8 g/dL — ABNORMAL LOW (ref 13.0–18.0)
MCH: 31.8 pg (ref 26.0–34.0)
MCHC: 34.9 g/dL (ref 32.0–36.0)
MCV: 91.2 fL (ref 80.0–100.0)
Platelets: 185 10*3/uL (ref 150–440)
RBC: 2.46 MIL/uL — ABNORMAL LOW (ref 4.40–5.90)
RDW: 16.8 % — ABNORMAL HIGH (ref 11.5–14.5)
WBC: 6.9 10*3/uL (ref 3.8–10.6)

## 2017-11-25 LAB — GLUCOSE, CAPILLARY
Glucose-Capillary: 148 mg/dL — ABNORMAL HIGH (ref 65–99)
Glucose-Capillary: 199 mg/dL — ABNORMAL HIGH (ref 65–99)
Glucose-Capillary: 179 mg/dL — ABNORMAL HIGH (ref 65–99)
Glucose-Capillary: 168 mg/dL — ABNORMAL HIGH (ref 65–99)

## 2017-11-25 SURGERY — IMAGING PROCEDURE, GI TRACT, INTRALUMINAL, VIA CAPSULE
Anesthesia: General

## 2017-11-25 NOTE — Progress Notes (Signed)
Jay Morris , MD [address] [place], [place], [place], [address] [address], [place], [place], [place], [address] Phone: [phone]  Fax: [phone]   Jay Morris is being followed for melena  Subjective: No bowel movents   Objective: Vital signs in last 24 hours: Vitals:   11/24/17 1920 11/24/17 2124 11/24/17 2159 11/25/17 0507  BP: (!) 124/51 (!) 131/56 (!) 124/50 (!) 112/48  Pulse: 74 70 73 66  Resp: _0 Temp: 98.2 F (36.8 C) 98.2 F (36.8 C) 97.9 F (36.6 C) 98.2 F (36.8 C)  TempSrc: Oral Oral Oral Oral  SpO2: 94% 95% 94% 94%  Weight:      Height:       Weight change:   Intake/Output Summary (Last 24 hours) at 11/25/2017 1347 Last data filed at 11/25/2017 0900 Gross per 24 hour  Intake 713 ml  Output 2075 ml  Net -1362 ml     Exam: Heart:: Regular rate and rhythm, S1S2 present or without murmur or extra heart sounds Lungs: normal, clear to auscultation and clear to auscultation and percussion Abdomen: soft, nontender, normal bowel sounds   Lab Results: _1 @ Micro Results: No results found for this or any previous visit (from the past 240 hour(s)). Studies/Results: Dg Abd 1 View  Result Date: 11/25/2017 CLINICAL DATA:  Evaluate for endoscopic capsule. EXAM: ABDOMEN - 1 VIEW COMPARISON:  Abdominal CT 11/21/2017 FINDINGS: Endoscopic capsule is in the right lower abdomen and appears to be in the cecum. Gas within the small and large bowel. Oral contrast in the colon related to the CT from 11/21/2017. Degenerative disc and endplate changes in the lower lumbar spine. IMPRESSION: Endoscopic capsule is in the right lower quadrant and probably in the cecum. Nonobstructive bowel gas pattern. Electronically Signed   By: Markus Daft M.D.   On: 11/25/2017 13:30   Medications: I have reviewed the patient's current medications. Scheduled Meds: . allopurinol  100 mg Oral Daily  . atorvastatin  20 mg Oral QHS  . carvedilol  12.5 mg Oral  BID WC  . chlorhexidine  15 mL Mouth Rinse BID  . fluticasone furoate-vilanterol  1 puff Inhalation Daily  . insulin aspart  0-5 Units Subcutaneous QHS  . insulin aspart  0-9 Units Subcutaneous TID WC  . insulin glargine  10 Units Subcutaneous Daily  . mouth rinse  15 mL Mouth Rinse q12n4p  . omega-3 acid ethyl esters  4 g Oral Daily  . pantoprazole  40 mg Oral BID  . tamsulosin  0.4 mg Oral QPM   Continuous Infusions: PRN Meds:.acetaminophen **OR** acetaminophen, metoCLOPramide (REGLAN) injection, ondansetron **OR** ondansetron (ZOFRAN) IV  CBC Latest Ref Rng & Units 11/25/2017 11/23/2017 11/23/2017  WBC 3.8 - 10.6 K/uL 6.9 - -  Hemoglobin 13.0 - 18.0 g/dL 7.8(L) 7.3(L) 6.8(L)  Hematocrit 40.0 - 52.0 % 22.4(L) 21.1(L) 19.7(L)  Platelets 150 - 440 K/uL 185 - -    Assessment: Active Problems:   GI bleed Jay Morris a 72 y.o.y/o malewith atrial fibrillation who has been evaluated in November 2018 by Dr. Alice Reichert at [facility] for iron deficiency anemia. Upper endoscopy and colonoscopy were noncontributory and showed no signs of bleeding. No capsule study of the small bowel was performed. The patient has been on Pradaxa. Admitted with an acute drop in hemoglobin from 12 g to 7.5 g and further drop to 7 grams today . Black-colored stools for a few days.EGD negative. Capsule performed yesterday didn't exit the stomach during  the length of the reading . Minimal increment in Hb after transfusion . X ray shows capsule probably in the cecum today .    Plan 1. Transfuse to target hemoglobin. 2. EGD on Sunday with placement of the capsule  3. Low b12 suggest IM replacement.  4. PPI   I have discussed alternative options, risks & benefits,  which include, but are not limited to, bleeding, infection, perforation,respiratory complication & drug reaction.  The patient agrees with this plan & written consent will be obtained.      LOS: 4 days   Jay Bellows, MD 11/25/2017, 1:47  PM

## 2017-11-25 NOTE — Progress Notes (Signed)
[facility] at [place] NAME: Jay Morris    MR#:  [id number]  DATE OF BIRTH:  1946/05/13  SUBJECTIVE:   Hemoglobin slightly improved after transfusion yesterday.  No further bowel movements overnight.  Capsule endoscopy done but unequivocal study as the patient's capsule was sitting in the stomach the whole time.  For endoscopic placement of the capsule study on Sunday.  REVIEW OF SYSTEMS:    Review of Systems  Constitutional: Negative for chills and fever.  HENT: Negative for congestion and tinnitus.   Eyes: Negative for blurred vision and double vision.  Respiratory: Negative for cough, shortness of breath and wheezing.   Cardiovascular: Negative for chest pain, orthopnea and PND.  Gastrointestinal: Negative for abdominal pain, diarrhea, nausea and vomiting.  Genitourinary: Negative for dysuria and hematuria.  Neurological: Negative for dizziness, sensory change and focal weakness.  All other systems reviewed and are negative.   Nutrition: soft diet Tolerating Diet: Yes Tolerating PT: Ambulatory  DRUG ALLERGIES:   Allergies  Allergen Reactions  . Shrimp [Shellfish Allergy] Anaphylaxis    VITALS:  Blood pressure (!) 150/65, pulse 64, temperature 98.1 F (36.7 C), temperature source Oral, resp. rate 20, height _0  (1.727 m), weight 90 kg (198 lb 8 oz), SpO2 99 %.  PHYSICAL EXAMINATION:   Physical Exam  GENERAL:  72 y.o.-year-old patient lying in bed in no acute distress.  EYES: Pupils equal, round, reactive to light and accommodation. No scleral icterus. Extraocular muscles intact.  HEENT: Head atraumatic, normocephalic. Oropharynx and nasopharynx clear.  NECK:  Supple, no jugular venous distention. No thyroid enlargement, no tenderness.  LUNGS: Normal breath sounds bilaterally, no wheezing, rales, rhonchi. No use of accessory muscles of respiration.  CARDIOVASCULAR: S1, S2 normal. No murmurs, rubs, or gallops.  ABDOMEN:  Soft, nontender, nondistended. Bowel sounds present. No organomegaly or mass.  EXTREMITIES: No cyanosis, clubbing or edema b/l.    NEUROLOGIC: Cranial nerves II through XII are intact. No focal Motor or sensory deficits b/l.   PSYCHIATRIC: The patient is alert and oriented x 3.  SKIN: No obvious rash, lesion, or ulcer.    LABORATORY PANEL:   CBC Recent Labs  Lab 11/25/17 0455  WBC 6.9  HGB 7.8*  HCT 22.4*  PLT 185   ------------------------------------------------------------------------------------------------------------------  Chemistries  Recent Labs  Lab 11/21/17 1113  11/23/17 0515  NA 136   < > 133*  K 4.9   < > 4.9  CL 109   < > 107  CO2 20*   < > 22  GLUCOSE 86   < > 222*  BUN 80*   < > 48*  CREATININE 1.82*   < > 1.33*  CALCIUM 8.4*   < > 8.1*  AST 17  --   --   ALT 18  --   --   ALKPHOS 17*  --   --   BILITOT 0.6  --   --    < > = values in this interval not displayed.   ------------------------------------------------------------------------------------------------------------------  Cardiac Enzymes Recent Labs  Lab 11/21/17 1113  TROPONINI <0.03   ------------------------------------------------------------------------------------------------------------------  RADIOLOGY:  Dg Abd 1 View  Result Date: 11/25/2017 CLINICAL DATA:  Evaluate for endoscopic capsule. EXAM: ABDOMEN - 1 VIEW COMPARISON:  Abdominal CT 11/21/2017 FINDINGS: Endoscopic capsule is in the right lower abdomen and appears to be in the cecum. Gas within the small and large bowel. Oral contrast in the colon related to the CT from 11/21/2017. Degenerative disc  and endplate changes in the lower lumbar spine. IMPRESSION: Endoscopic capsule is in the right lower quadrant and probably in the cecum. Nonobstructive bowel gas pattern. Electronically Signed   By: Markus Daft M.D.   On: 11/25/2017 13:30     ASSESSMENT AND PLAN:   72 year old male with past medical history of atrial  fibrillation, hypertension, hyperlipidemia, COPD, diabetes who presents to the hospital due to dizziness, generalized weakness and black stools noted to be anemic.  1.  GI bleed- suspected to be upper GI bleed given the patient's melanotic stools.   -Hemoglobin stable posttransfusion.  No acute bleeding overnight.  Hemoglobin stable at 7.8 today. - Endoscopy showing no acute bleeding, capsule endoscopy study was not interpreted at this as the patient's capsule stayed in the stomach the whole time.  X-ray showing that the capsule was sitting in the cecum.  Discussed with gastroenterology and plan for endoscopic placement of the capsule on Sunday. -Continue to hold Pradaxa, continue Protonix twice daily.  2.  Anemia-this is acute blood loss anemia secondary to GI bleed. - No acute bleeding overnight.  Hemoglobin stable at 7.8.  Will repeat in the morning and transfuse if less than 7.  Hold Pradaxa  3.  Diabetes type 2 without complication- cont. Lantus, SSI.  BS stable.   4.  History of chronic atrial fibrillation-rate controlled.  Continue carvedilol.  Hold Pradaxa given the anemia/GI bleed.  5.  BPH-continue Flomax.  6.  COPD-no acute exacerbation-continue Brio Ellipta  7.  History of gout-no acute attack.  Continue allopurinol.  8.  Hyperlipidemia-continue atorvastatin.  Discussed plan of care with pt's family at bedside and also GI.   All the records are reviewed and case discussed with Care Management/Social Worker. Management plans discussed with the patient, family and they are in agreement.  CODE STATUS: Full code  DVT Prophylaxis: Ted's & SCD's  TOTAL TIME TAKING CARE OF THIS PATIENT: 30 minutes.   POSSIBLE D/C IN 2-3 DAYS, DEPENDING ON CLINICAL CONDITION.   Henreitta Leber M.D on 11/25/2017 at 3:23 PM  Between 7am to 6pm - Pager - [phone]  After 6pm go to [email or web address] - Proofreader  [facility]  Office   [phone]  CC: Primary care physician; [name], MD

## 2017-11-26 LAB — CBC
HEMATOCRIT: 23.3 % — AB (ref 40.0–52.0)
HEMOGLOBIN: 8.4 g/dL — AB (ref 13.0–18.0)
MCH: 33 pg (ref 26.0–34.0)
MCHC: 36.2 g/dL — ABNORMAL HIGH (ref 32.0–36.0)
MCV: 91.1 fL (ref 80.0–100.0)
Platelets: 210 10*3/uL (ref 150–440)
RBC: 2.55 MIL/uL — AB (ref 4.40–5.90)
RDW: 16.5 % — ABNORMAL HIGH (ref 11.5–14.5)
WBC: 6 10*3/uL (ref 3.8–10.6)

## 2017-11-26 LAB — GLUCOSE, CAPILLARY
GLUCOSE-CAPILLARY: 157 mg/dL — AB (ref 65–99)
GLUCOSE-CAPILLARY: 167 mg/dL — AB (ref 65–99)
GLUCOSE-CAPILLARY: 208 mg/dL — AB (ref 65–99)
Glucose-Capillary: 208 mg/dL — ABNORMAL HIGH (ref 65–99)

## 2017-11-26 NOTE — Progress Notes (Signed)
[facility] at [place] NAME: Jay Morris    MR#:  [id number]  DATE OF BIRTH:  1945/10/14  SUBJECTIVE:   Hemoglobin 8.4 today.  Received blood transfusion on May 9 for hemoglobin of 7.3.  Patient started on solid diet yesterday, no BM yet.  Patient will have endoscopic placement of capsule and small intestine by GI tomorrow.  REVIEW OF SYSTEMS:    Review of Systems  Constitutional: Negative for chills and fever.  HENT: Negative for congestion and tinnitus.   Eyes: Negative for blurred vision and double vision.  Respiratory: Negative for cough, shortness of breath and wheezing.   Cardiovascular: Negative for chest pain, orthopnea and PND.  Gastrointestinal: Negative for abdominal pain, diarrhea, nausea and vomiting.  Genitourinary: Negative for dysuria and hematuria.  Neurological: Negative for dizziness, sensory change and focal weakness.  All other systems reviewed and are negative.   Nutrition: soft diet Tolerating Diet: Yes Tolerating PT: Ambulatory  DRUG ALLERGIES:   Allergies  Allergen Reactions  . Shrimp [Shellfish Allergy] Anaphylaxis    VITALS:  Blood pressure (!) 104/45, pulse 62, temperature 97.6 F (36.4 C), temperature source Oral, resp. rate 16, height _0  (1.727 m), weight 90 kg (198 lb 8 oz), SpO2 92 %.  PHYSICAL EXAMINATION:   Physical Exam  GENERAL:  72 y.o.-year-old patient lying in bed in no acute distress.  EYES: Pupils equal, round, reactive to light and accommodation. No scleral icterus. Extraocular muscles intact.  HEENT: Head atraumatic, normocephalic. Oropharynx and nasopharynx clear.  NECK:  Supple, no jugular venous distention. No thyroid enlargement, no tenderness.  LUNGS: Normal breath sounds bilaterally, no wheezing, rales, rhonchi. No use of accessory muscles of respiration.  CARDIOVASCULAR: S1, S2 normal. No murmurs, rubs, or gallops.  ABDOMEN: Soft, nontender, nondistended. Bowel sounds  present. No organomegaly or mass.  EXTREMITIES: No cyanosis, clubbing or edema b/l.    NEUROLOGIC: Cranial nerves II through XII are intact. No focal Motor or sensory deficits b/l.   PSYCHIATRIC: The patient is alert and oriented x 3.  SKIN: No obvious rash, lesion, or ulcer.    LABORATORY PANEL:   CBC Recent Labs  Lab 11/26/17 0422  WBC 6.0  HGB 8.4*  HCT 23.3*  PLT 210   ------------------------------------------------------------------------------------------------------------------  Chemistries  Recent Labs  Lab 11/21/17 1113  11/23/17 0515  NA 136   < > 133*  K 4.9   < > 4.9  CL 109   < > 107  CO2 20*   < > 22  GLUCOSE 86   < > 222*  BUN 80*   < > 48*  CREATININE 1.82*   < > 1.33*  CALCIUM 8.4*   < > 8.1*  AST 17  --   --   ALT 18  --   --   ALKPHOS 17*  --   --   BILITOT 0.6  --   --    < > = values in this interval not displayed.   ------------------------------------------------------------------------------------------------------------------  Cardiac Enzymes Recent Labs  Lab 11/21/17 1113  TROPONINI <0.03   ------------------------------------------------------------------------------------------------------------------  RADIOLOGY:  Dg Abd 1 View  Result Date: 11/25/2017 CLINICAL DATA:  Evaluate for endoscopic capsule. EXAM: ABDOMEN - 1 VIEW COMPARISON:  Abdominal CT 11/21/2017 FINDINGS: Endoscopic capsule is in the right lower abdomen and appears to be in the cecum. Gas within the small and large bowel. Oral contrast in the colon related to the CT from 11/21/2017. Degenerative disc and endplate changes  in the lower lumbar spine. IMPRESSION: Endoscopic capsule is in the right lower quadrant and probably in the cecum. Nonobstructive bowel gas pattern. Electronically Signed   By: Markus Daft M.D.   On: 11/25/2017 13:30     ASSESSMENT AND PLAN:   72 year old male with past medical history of atrial fibrillation, hypertension, hyperlipidemia, COPD,  diabetes who presents to the hospital due to dizziness, generalized weakness and black stools noted to be anemic.  1.  GI bleed- suspected to be upper GI bleed given the patient's melanotic stools.   -Hemoglobin stable posttransfusion.  No acute bleeding overnight.  Hemoglobin stable at 7.8 today. - Endoscopy showing no acute bleeding, capsule endoscopy study was not interpreted at this as the patient's capsule stayed in the stomach the whole time.  X-ray showing that the capsule was sitting in the cecum.  Discussed with gastroenterology and plan for endoscopic placement of the capsule on Sunday. -Continue to hold Pradaxa, continue Protonix twice daily.  2.  Anemia-this is acute blood loss anemia secondary to GI bleed. - No acute bleeding overnight.  Hemoglobin stable at 8.4.  .  Hold Pradaxa  3.  Diabetes type 2 without complication- cont. Lantus, SSI.  BS stable.   4.  History of chronic atrial fibrillation-rate controlled.  Continue carvedilol.  Hold Pradaxa given the anemia/GI bleed.  5.  BPH-continue Flomax.  6.  COPD-no acute exacerbation-continue Brio Ellipta  7.  History of gout-no acute attack.  Continue allopurinol.  8.  Hyperlipidemia-continue atorvastatin.  Patient is eager to have endoscopic placement tomorrow. He denies  any other complaints  Out of bed to chair today.. All the records are reviewed and case discussed with Care Management/Social Worker. Management plans discussed with the patient, family and they are in agreement.  CODE STATUS: Full code  DVT Prophylaxis: Ted's & SCD's  TOTAL TIME TAKING CARE OF THIS PATIENT: 30 minutes.   POSSIBLE D/C IN 2-3 DAYS, DEPENDING ON CLINICAL CONDITION.   Epifanio Lesches M.D on 11/26/2017 at 9:54 AM  Between 7am to 6pm - Pager - [phone]  After 6pm go to [email or web address] - Proofreader  [facility]  Office  [phone]  CC: Primary care physician; [name],  MD

## 2017-11-26 NOTE — Plan of Care (Signed)
  Problem: Bowel/Gastric: Goal: Will show no signs and symptoms of gastrointestinal bleeding Outcome: Progressing

## 2017-11-27 ENCOUNTER — Encounter: Payer: Self-pay | Admitting: *Deleted

## 2017-11-27 ENCOUNTER — Inpatient Hospital Stay: Payer: Medicare Other | Admitting: Anesthesiology

## 2017-11-27 ENCOUNTER — Encounter
Admission: EM | Disposition: A | Discharge status: Home / Self Care | Payer: Self-pay | Source: Home / Self Care | Attending: Internal Medicine

## 2017-11-27 DIAGNOSIS — D500 Iron deficiency anemia secondary to blood loss (chronic): Secondary | ICD-10-CM

## 2017-11-27 HISTORY — PX: ESOPHAGOGASTRODUODENOSCOPY: SHX5428

## 2017-11-27 LAB — CBC
HCT: 24.3 % — ABNORMAL LOW (ref 40.0–52.0)
HEMOGLOBIN: 8.4 g/dL — AB (ref 13.0–18.0)
MCH: 32.1 pg (ref 26.0–34.0)
MCHC: 34.7 g/dL (ref 32.0–36.0)
MCV: 92.6 fL (ref 80.0–100.0)
Platelets: 245 10*3/uL (ref 150–440)
RBC: 2.63 MIL/uL — ABNORMAL LOW (ref 4.40–5.90)
RDW: 17.4 % — AB (ref 11.5–14.5)
WBC: 5.7 10*3/uL (ref 3.8–10.6)

## 2017-11-27 LAB — GLUCOSE, CAPILLARY
Glucose-Capillary: 179 mg/dL — ABNORMAL HIGH (ref 65–99)
Glucose-Capillary: 157 mg/dL — ABNORMAL HIGH (ref 65–99)
Glucose-Capillary: 144 mg/dL — ABNORMAL HIGH (ref 65–99)
Glucose-Capillary: 208 mg/dL — ABNORMAL HIGH (ref 65–99)

## 2017-11-27 SURGERY — EGD (ESOPHAGOGASTRODUODENOSCOPY)
Anesthesia: General

## 2017-11-27 MED ORDER — SODIUM CHLORIDE 0.9 % IV SOLN
INTRAVENOUS | Status: DC | PRN
  Administered 2017-11-27: 09:00:00 via INTRAVENOUS

## 2017-11-27 MED ORDER — PROPOFOL 10 MG/ML IV BOLUS
INTRAVENOUS | Status: DC | PRN
  Administered 2017-11-27: 80 mg via INTRAVENOUS

## 2017-11-27 MED ORDER — PROPOFOL 500 MG/50ML IV EMUL
INTRAVENOUS | Status: DC | PRN
  Administered 2017-11-27: 50 ug/kg/min via INTRAVENOUS

## 2017-11-27 MED ORDER — PROPOFOL 500 MG/50ML IV EMUL
INTRAVENOUS | Status: AC
  Filled 2017-11-27: qty 50

## 2017-11-27 MED ORDER — EPHEDRINE SULFATE-NACL 50-0.9 MG/10ML-% IV SOSY
PREFILLED_SYRINGE | INTRAVENOUS | Status: DC | PRN
  Administered 2017-11-27: 10 mg via INTRAVENOUS

## 2017-11-27 NOTE — Op Note (Signed)
[facility] Patient Name: Jay Morris Procedure Date: 11/27/2017 8:41 AM MRN: [id number] Account #: [id number] Date of Birth: 05/28/1946 Admit Type: Inpatient Age: 72 Room: [facility] ENDO ROOM 4 Gender: Male Note Status: Finalized Procedure:            Upper GI endoscopy Indications:          Iron deficiency anemia, Occult blood in stool, Capsule                        placement because previous capsule did not leave the                        stomach. Providers:            Lucilla Lame MD, MD Referring MD:         Leonie Douglas. Doy Hutching, MD (Referring MD) Medicines:            Propofol per Anesthesia Complications:        No immediate complications. Procedure:            Pre-Anesthesia Assessment:                       - Prior to the procedure, a History and Physical was                        performed, and patient medications and allergies were                        reviewed. The patient's tolerance of previous                        anesthesia was also reviewed. The risks and benefits of                        the procedure and the sedation options and risks were                        discussed with the patient. All questions were                        answered, and informed consent was obtained. Prior                        Anticoagulants: The patient has taken no previous                        anticoagulant or antiplatelet agents. ASA Grade                        Assessment: II - A patient with mild systemic disease.                        After reviewing the risks and benefits, the patient was                        deemed in satisfactory condition to undergo the                        procedure.  After obtaining informed consent, the endoscope was                        passed under direct vision. Throughout the procedure,                        the patient's blood pressure, pulse, and oxygen   saturations were monitored continuously. The Endoscope                        was introduced through the mouth, and advanced to the                        second part of duodenum. The upper GI endoscopy was                        accomplished without difficulty. The patient tolerated                        the procedure well. Findings:      A small hiatal hernia was present.      Localized mild inflammation characterized by erythema was found in the       gastric antrum.      The examined duodenum was normal.      Using the endoscope, the video capsule enteroscope was advanced into the       second portion of the duodenum. Impression:           - Small hiatal hernia.                       - Gastritis.                       - Normal examined duodenum.                       - Successful completion of the Video Capsule                        Enteroscope placement.                       - No specimens collected. Recommendation:       - Return patient to hospital ward for ongoing care. Procedure Code(s):    --- Professional ---                       [phone], Esophagogastroduodenoscopy, flexible, transoral;                        diagnostic, including collection of specimen(s) by                        brushing or washing, when performed (separate procedure) Diagnosis Code(s):    --- Professional ---                       D50.9, Iron deficiency anemia, unspecified                       K29.70, Gastritis, unspecified, without bleeding  R63.3, Feeding difficulties                       R19.5, Other fecal abnormalities CPT copyright 2017 American Medical Association. All rights reserved. The codes documented in this report are preliminary and upon coder review may  be revised to meet current compliance requirements. Lucilla Lame MD, MD 11/27/2017 9:07:05 AM This report has been signed electronically. Number of Addenda: 0 Note Initiated On: 11/27/2017 8:41 AM      [facility]

## 2017-11-27 NOTE — Anesthesia Postprocedure Evaluation (Signed)
Anesthesia Post Note  Patient: Jay Morris  Procedure(s) Performed: ENTEROSCOPY (N/A )  Patient location during evaluation: Endoscopy Anesthesia Type: General Level of consciousness: awake and alert Pain management: pain level controlled Vital Signs Assessment: post-procedure vital signs reviewed and stable Respiratory status: spontaneous breathing, nonlabored ventilation, respiratory function stable and patient connected to nasal cannula oxygen Cardiovascular status: blood pressure returned to baseline and stable Postop Assessment: no apparent nausea or vomiting Anesthetic complications: no     Last Vitals:  Vitals:   11/26/17 1919 11/27/17 0440  BP: (!) 144/56 (!) 117/51  Pulse: 64 (!) 59  Resp: 18 18  Temp: 36.6 C 36.7 C  SpO2: 96% 94%    Last Pain:  Vitals:   11/27/17 0440  TempSrc: Oral  PainSc:                  Martha Clan

## 2017-11-27 NOTE — Anesthesia Postprocedure Evaluation (Signed)
Anesthesia Post Note  Patient: Jay Morris  Procedure(s) Performed: ESOPHAGOGASTRODUODENOSCOPY (EGD) (N/A )  Patient location during evaluation: PACU Anesthesia Type: General Level of consciousness: awake and alert and oriented Pain management: pain level controlled Vital Signs Assessment: post-procedure vital signs reviewed and stable Respiratory status: spontaneous breathing, nonlabored ventilation and respiratory function stable Cardiovascular status: blood pressure returned to baseline and stable Postop Assessment: no signs of nausea or vomiting Anesthetic complications: no     Last Vitals:  Vitals:   11/27/17 0929 11/27/17 0933  BP: (!) 115/44 (!) 111/47  Pulse: (!) 53 (!) 51  Resp: 18 17  Temp:    SpO2: 100% 100%    Last Pain:  Vitals:   11/27/17 0843  TempSrc:   PainSc: 0-No pain                 Ajeet Casasola

## 2017-11-27 NOTE — Anesthesia Preprocedure Evaluation (Addendum)
Anesthesia Evaluation  Patient identified by MRN, date of birth, ID band Patient awake    Reviewed: Allergy & Precautions, NPO status , Patient's Chart, lab work & pertinent test results  History of Anesthesia Complications Negative for: history of anesthetic complications  Airway Mallampati: II  TM Distance: >3 FB Neck ROM: Full    Dental  (+) Poor Dentition   Pulmonary asthma , COPD,  COPD inhaler, former smoker,    breath sounds clear to auscultation- rhonchi (-) wheezing      Cardiovascular hypertension, Pt. on medications (-) CAD, (-) Past MI, (-) Cardiac Stents and (-) CABG + dysrhythmias Atrial Fibrillation  Rhythm:Regular Rate:Normal - Systolic murmurs and - Diastolic murmurs    Neuro/Psych negative neurological ROS  negative psych ROS   GI/Hepatic Neg liver ROS, GIB   Endo/Other  diabetes, Insulin Dependent  Renal/GU negative Renal ROS     Musculoskeletal negative musculoskeletal ROS (+)   Abdominal (+) + obese,   Peds  Hematology negative hematology ROS (+)   Anesthesia Other Findings Past Medical History: No date: Asthma No date: Atrial fibrillation (HCC) No date: BPH (benign prostatic hyperplasia) No date: COPD (chronic obstructive pulmonary disease) (HCC) No date: Diabetes mellitus without complication (HCC) No date: Helicobacter pylori gastritis No date: Hyperlipidemia No date: Hypertension No date: Pneumonia   Reproductive/Obstetrics                             Anesthesia Physical Anesthesia Plan  ASA: III  Anesthesia Plan: General   Post-op Pain Management:    Induction: Intravenous  PONV Risk Score and Plan: 1 and Propofol infusion  Airway Management Planned: Natural Airway  Additional Equipment:   Intra-op Plan:   Post-operative Plan:   Informed Consent: I have reviewed the patients History and Physical, chart, labs and discussed the procedure  including the risks, benefits and alternatives for the proposed anesthesia with the patient or authorized representative who has indicated his/her understanding and acceptance.   Dental advisory given  Plan Discussed with: CRNA and Anesthesiologist  Anesthesia Plan Comments:         Anesthesia Quick Evaluation

## 2017-11-27 NOTE — Anesthesia Post-op Follow-up Note (Signed)
Anesthesia QCDR form completed.

## 2017-11-27 NOTE — Progress Notes (Signed)
Report given Holiday representative.

## 2017-11-27 NOTE — Progress Notes (Signed)
[place] [facility] [place] NAME: Jay Morris    MR#:  [id number]  DATE OF BIRTH:  1945/11/10  SUBJECTIVE: Patient is denying any complaints, had a capsule placed in small bowel by GI this morning.  Patient is to be n.p.o. till 4 PM.   .  REVIEW OF SYSTEMS:    Review of Systems  Constitutional: Negative for chills and fever.  HENT: Negative for congestion and tinnitus.   Eyes: Negative for blurred vision and double vision.  Respiratory: Negative for cough, shortness of breath and wheezing.   Cardiovascular: Negative for chest pain, orthopnea and PND.  Gastrointestinal: Negative for abdominal pain, diarrhea, nausea and vomiting.  Genitourinary: Negative for dysuria and hematuria.  Neurological: Negative for dizziness, sensory change and focal weakness.  All other systems reviewed and are negative.   Nutrition: soft diet Tolerating Diet: Yes Tolerating PT: Ambulatory  DRUG ALLERGIES:   Allergies  Allergen Reactions  . Shrimp [Shellfish Allergy] Anaphylaxis    VITALS:  Blood pressure (!) 111/47, pulse (!) 51, temperature 98 F (36.7 C), temperature source Oral, resp. rate 17, height _0  (1.727 m), weight 90 kg (198 lb 8 oz), SpO2 100 %.  PHYSICAL EXAMINATION:   Physical Exam  GENERAL:  72 y.o.-year-old patient lying in bed in no acute distress.  EYES: Pupils equal, round, reactive to light and accommodation. No scleral icterus. Extraocular muscles intact.  HEENT: Head atraumatic, normocephalic. Oropharynx and nasopharynx clear.  NECK:  Supple, no jugular venous distention. No thyroid enlargement, no tenderness.  LUNGS: Normal breath sounds bilaterally, no wheezing, rales, rhonchi. No use of accessory muscles of respiration.  CARDIOVASCULAR: S1, S2 normal. No murmurs, rubs, or gallops.  ABDOMEN: Soft, nontender, nondistended. Bowel sounds present. No organomegaly or mass.  EXTREMITIES: No cyanosis, clubbing or edema b/l.     NEUROLOGIC: Cranial nerves II through XII are intact. No focal Motor or sensory deficits b/l.   PSYCHIATRIC: The patient is alert and oriented x 3.  SKIN: No obvious rash, lesion, or ulcer.    LABORATORY PANEL:   CBC Recent Labs  Lab 11/27/17 1230  WBC 5.7  HGB 8.4*  HCT 24.3*  PLT 245   ------------------------------------------------------------------------------------------------------------------  Chemistries  Recent Labs  Lab 11/21/17 1113  11/23/17 0515  NA 136   < > 133*  K 4.9   < > 4.9  CL 109   < > 107  CO2 20*   < > 22  GLUCOSE 86   < > 222*  BUN 80*   < > 48*  CREATININE 1.82*   < > 1.33*  CALCIUM 8.4*   < > 8.1*  AST 17  --   --   ALT 18  --   --   ALKPHOS 17*  --   --   BILITOT 0.6  --   --    < > = values in this interval not displayed.   ------------------------------------------------------------------------------------------------------------------  Cardiac Enzymes Recent Labs  Lab 11/21/17 1113  TROPONINI <0.03   ------------------------------------------------------------------------------------------------------------------  RADIOLOGY:  No results found.   ASSESSMENT AND PLAN:   72 year old male with past medical history of atrial fibrillation, hypertension, hyperlipidemia, COPD, diabetes who presents to the hospital due to dizziness, generalized weakness and black stools noted to be anemic.  1.  GI bleed- suspected to be upper GI bleed given the patient's melanotic stools.   -Hemoglobin stable posttransfusion.  No acute bleeding overnight.  hemoGlobin stable around 8.4. - upperEndoscopy showing no  acute bleeding, endoscopic placement of capsule and small intestine today by gastroenterology.  -Continue to hold Pradaxa, continue Protonix twice daily.  2.  Anemia-this is acute blood loss anemia secondary to GI bleed. - No acute bleeding overnight.  Hemoglobin stable at 8.4.  .  Hold Pradaxa  3.  Diabetes type 2 without  complication- cont. Lantus, SSI.  BS stable.   4.  History of chronic atrial fibrillation-rate controlled.  Continue carvedilol.  Hold Pradaxa given the anemia/GI bleed.  5.  BPH-continue Flomax.  6.  COPD-no acute exacerbation-continue Brio Ellipta  7.  History of gout-no acute attack.  Continue allopurinol.  8.  Hyperlipidemia-continue atorvastatin. Spoke with patient's wife.  All the records are reviewed and case discussed with Care Management/Social Worker. Management plans discussed with the patient, family and they are in agreement.  CODE STATUS: Full code  DVT Prophylaxis: Ted's & SCD's  TOTAL TIME TAKING CARE OF THIS PATIENT: 30 minutes.   POSSIBLE D/C IN 2-3 DAYS, DEPENDING ON CLINICAL CONDITION.   Epifanio Lesches M.D on 11/27/2017 at 2:00 PM  Between 7am to 6pm - Pager - [phone]  After 6pm go to [email or web address] - Proofreader  [facility]  Office  [phone]  CC: Primary care physician; [name], MD

## 2017-11-27 NOTE — Brief Op Note (Signed)
Small bowel capsule inserted in duodenum with scope.

## 2017-11-27 NOTE — Transfer of Care (Signed)
Immediate Anesthesia Transfer of Care Note  Patient: Jay Morris  Procedure(s) Performed: ESOPHAGOGASTRODUODENOSCOPY (EGD) (N/A )  Patient Location: PACU  Anesthesia Type:General  Level of Consciousness: awake, alert  and oriented  Airway & Oxygen Therapy: Patient Spontanous Breathing and Patient connected to nasal cannula oxygen  Post-op Assessment: Report given to RN and Post -op Vital signs reviewed and stable  Post vital signs: Reviewed and stable  Last Vitals:  Vitals Value Taken Time  BP 96/45 11/27/2017  9:22 AM  Temp    Pulse 51 11/27/2017  9:23 AM  Resp 19 11/27/2017  9:23 AM  SpO2 99 % 11/27/2017  9:23 AM  Vitals shown include unvalidated device data.  Last Pain:  Vitals:   11/27/17 0843  TempSrc:   PainSc: 0-No pain      Patients Stated Pain Goal: 0 (38/88/75 7972)  Complications: No apparent anesthesia complications

## 2017-11-28 DIAGNOSIS — D62 Acute posthemorrhagic anemia: Secondary | ICD-10-CM

## 2017-11-28 LAB — GLUCOSE, CAPILLARY
GLUCOSE-CAPILLARY: 204 mg/dL — AB (ref 65–99)
Glucose-Capillary: 154 mg/dL — ABNORMAL HIGH (ref 65–99)
Glucose-Capillary: 206 mg/dL — ABNORMAL HIGH (ref 65–99)
Glucose-Capillary: 240 mg/dL — ABNORMAL HIGH (ref 65–99)

## 2017-11-28 MED ORDER — CYANOCOBALAMIN 1000 MCG/ML IJ SOLN
1000.0000 ug | Freq: Every day | INTRAMUSCULAR | Status: DC
  Administered 2017-11-28 – 2017-11-29 (×2): 1000 ug via INTRAMUSCULAR
  Filled 2017-11-28 (×2): qty 1

## 2017-11-28 MED ORDER — LACTULOSE 10 GM/15ML PO SOLN
20.0000 g | Freq: Once | ORAL | Status: AC
  Administered 2017-11-28: 20 g via ORAL
  Filled 2017-11-28: qty 30

## 2017-11-28 MED ORDER — DOCUSATE SODIUM 100 MG PO CAPS
100.0000 mg | ORAL_CAPSULE | Freq: Two times a day (BID) | ORAL | Status: DC
  Administered 2017-11-28 – 2017-11-29 (×2): 100 mg via ORAL
  Filled 2017-11-28 (×3): qty 1

## 2017-11-28 NOTE — Care Management Important Message (Signed)
Copy of signed IM left in patient's room.

## 2017-11-28 NOTE — Progress Notes (Signed)
    Jay Darby, MD [address]  [place]  [place], [place] [address]  Main: [phone]  Fax: [phone] Pager: [phone]   Subjective: Feels well.   Objective: Vital signs in last 24 hours: Vitals:   11/28/17 0545 11/28/17 0817 11/28/17 0959 11/28/17 1733  BP: (!) 111/51 (!) 124/52 (!) 116/41 (!) 123/50  Pulse: (!) 59 (!) 55 (!) 57 61  Resp: _0 Temp: 98.6 F (37 C) 97.7 F (36.5 C) 98.1 F (36.7 C) 98.1 F (36.7 C)  TempSrc: Oral Oral Oral Oral  SpO2: 98% 94% 97% 96%  Weight:      Height:       Weight change:   Intake/Output Summary (Last 24 hours) at 11/28/2017 1941 Last data filed at 11/28/2017 1904 Gross per 24 hour  Intake 240 ml  Output 1200 ml  Net -960 ml     Exam: Heart:: decreased rate and regular rhythm or S1S2 present Lungs: clear to auscultation Abdomen: soft, nontender, normal bowel sounds   Lab Results: CBC Latest Ref Rng & Units 11/27/2017 11/26/2017 11/25/2017  WBC 3.8 - 10.6 K/uL 5.7 6.0 6.9  Hemoglobin 13.0 - 18.0 g/dL 8.4(L) 8.4(L) 7.8(L)  Hematocrit 40.0 - 52.0 % 24.3(L) 23.3(L) 22.4(L)  Platelets 150 - 440 K/uL 245 210 185   Micro Results: No results found for this or any previous visit (from the past 240 hour(s)). Studies/Results: No results found. Medications: I have reviewed the patient's current medications. Scheduled Meds: . allopurinol  100 mg Oral Daily  . atorvastatin  20 mg Oral QHS  . carvedilol  12.5 mg Oral BID WC  . chlorhexidine  15 mL Mouth Rinse BID  . cyanocobalamin  1,000 mcg Intramuscular Daily  . docusate sodium  100 mg Oral BID  . fluticasone furoate-vilanterol  1 puff Inhalation Daily  . insulin aspart  0-5 Units Subcutaneous QHS  . insulin aspart  0-9 Units Subcutaneous TID WC  . insulin glargine  10 Units Subcutaneous Daily  . mouth rinse  15 mL Mouth Rinse q12n4p  . omega-3 acid ethyl esters  4 g Oral Daily  . pantoprazole  40 mg Oral BID  . tamsulosin  0.4 mg Oral QPM    Continuous Infusions: PRN Meds:.acetaminophen **OR** acetaminophen, metoCLOPramide (REGLAN) injection, ondansetron **OR** ondansetron (ZOFRAN) IV   Assessment: Active Problems:   GI bleed   Iron deficiency anemia due to chronic blood loss  B12 deficiency anemia EGD, colonoscopy and small bowel endoscopy normal VCE normal   Plan: No further testing at this time Replace B12, start oral B12 1068mg daily as outpt Resume anticoagulation from GI stand point Follow up in GI clinic with Dr AVicente Malesin 3-4 weeks after discharge   LOS: 7 days   Jay Morris 11/28/2017, 7:41 PM

## 2017-11-28 NOTE — Progress Notes (Addendum)
[place] [facility] [place] NAME: Jay Morris    MR#:  [id number]  DATE OF BIRTH:  09-21-1945  SUBJECTIVE: hemo globin stable, waiting for capsule study results.   Marland Kitchen  REVIEW OF SYSTEMS:    Review of Systems  Constitutional: Negative for chills and fever.  HENT: Negative for congestion and tinnitus.   Eyes: Negative for blurred vision and double vision.  Respiratory: Negative for cough, shortness of breath and wheezing.   Cardiovascular: Negative for chest pain, orthopnea and PND.  Gastrointestinal: Negative for abdominal pain, diarrhea, nausea and vomiting.  Genitourinary: Negative for dysuria and hematuria.  Neurological: Negative for dizziness, sensory change and focal weakness.  All other systems reviewed and are negative.   Nutrition: soft diet Tolerating Diet: Yes Tolerating PT: Ambulatory  DRUG ALLERGIES:   Allergies  Allergen Reactions  . Shrimp [Shellfish Allergy] Anaphylaxis    VITALS:  Blood pressure (!) 116/41, pulse (!) 57, temperature 98.1 F (36.7 C), temperature source Oral, resp. rate 18, height _0  (1.727 m), weight 90 kg (198 lb 8 oz), SpO2 97 %.  PHYSICAL EXAMINATION:   Physical Exam  GENERAL:  72 y.o.-year-old patient lying in bed in no acute distress.  EYES: Pupils equal, round, reactive to light and accommodation. No scleral icterus. Extraocular muscles intact.  HEENT: Head atraumatic, normocephalic. Oropharynx and nasopharynx clear.  NECK:  Supple, no jugular venous distention. No thyroid enlargement, no tenderness.  LUNGS: Normal breath sounds bilaterally, no wheezing, rales, rhonchi. No use of accessory muscles of respiration.  CARDIOVASCULAR: S1, S2 normal. No murmurs, rubs, or gallops.  ABDOMEN: Soft, nontender, nondistended. Bowel sounds present. No organomegaly or mass.  EXTREMITIES: No cyanosis, clubbing or edema b/l.    NEUROLOGIC: Cranial nerves II through XII are intact. No focal Motor or  sensory deficits b/l.   PSYCHIATRIC: The patient is alert and oriented x 3.  SKIN: No obvious rash, lesion, or ulcer.    LABORATORY PANEL:   CBC Recent Labs  Lab 11/27/17 1230  WBC 5.7  HGB 8.4*  HCT 24.3*  PLT 245   ------------------------------------------------------------------------------------------------------------------  Chemistries  Recent Labs  Lab 11/23/17 0515  NA 133*  K 4.9  CL 107  CO2 22  GLUCOSE 222*  BUN 48*  CREATININE 1.33*  CALCIUM 8.1*   ------------------------------------------------------------------------------------------------------------------  Cardiac Enzymes No results for input(s): TROPONINI in the last 168 hours. ------------------------------------------------------------------------------------------------------------------  RADIOLOGY:  No results found.   ASSESSMENT AND PLAN:   71 year old male with past medical history of atrial fibrillation, hypertension, hyperlipidemia, COPD, diabetes who presents to the hospital due to dizziness, generalized weakness and black stools noted to be anemic.  1.  GI bleed- suspected to be upper GI bleed given the patient's melanotic stools.   -Hemoglobin stable posttransfusion.  No acute bleeding overnight.  hemoGlobin stable around 8.4. - upperEndoscopy showing no acute bleeding, endoscopic  waiting for capsule study result.  Called Dr. Marius Ditch..  Anemia-this is acute blood loss anemia secondary to GI bleed. - No acute bleeding overnight.  Hemoglobin stable at 8.4.  .  Hold Pradaxa, continue PPI.  3.  Diabetes type 2 without complication- cont. Lantus, SSI.  BS stable.   4.  History of chronic atrial fibrillation- patient heart rate 57 so did not get morning dose of Coreg.  Continue to hold Pradaxa. 5.  BPH-continue Flomax.  6.  COPD-no acute exacerbation-continue Brio Ellipta  7.  History of gout-no acute attack.  Continue allopurinol.  8.  Hyperlipidemia-continue  atorvastatin. Spoke with patient's wife.  All the records are reviewed and case discussed with Care Management/Social Worker. Management plans discussed with the patient, family and they are in agreement.  CODE STATUS: Full code  DVT Prophylaxis: Ted's & SCD's  TOTAL TIME TAKING CARE OF THIS PATIENT: 33 minutes.   POSSIBLE D/C IN 2-3 DAYS, DEPENDING ON CLINICAL CONDITION. 50% of time spent in counseling and coordination of care.  Epifanio Lesches M.D on 11/28/2017 at 2:54 PM  Between 7am to 6pm - Pager - [phone]  After 6pm go to [email or web address] - Proofreader  [facility]  Office  [phone]  CC: Primary care physician; [name], MD

## 2017-11-28 NOTE — Plan of Care (Signed)
  Problem: Education: Goal: Knowledge of General Education information will improve Outcome: Progressing   Problem: Health Behavior/Discharge Planning: Goal: Ability to manage health-related needs will improve Outcome: Progressing   Problem: Clinical Measurements: Goal: Ability to maintain clinical measurements within normal limits will improve Outcome: Progressing Goal: Will remain free from infection Outcome: Progressing Goal: Diagnostic test results will improve Outcome: Progressing Goal: Respiratory complications will improve Outcome: Progressing Goal: Cardiovascular complication will be avoided Outcome: Progressing   Problem: Activity: Goal: Risk for activity intolerance will decrease Outcome: Progressing   Problem: Nutrition: Goal: Adequate nutrition will be maintained Outcome: Progressing   Problem: Coping: Goal: Level of anxiety will decrease Outcome: Progressing   Problem: Elimination: Goal: Will not experience complications related to bowel motility Outcome: Progressing Goal: Will not experience complications related to urinary retention Outcome: Progressing   Problem: Pain Managment: Goal: General experience of comfort will improve Outcome: Progressing   Problem: Safety: Goal: Ability to remain free from injury will improve Outcome: Progressing   Problem: Skin Integrity: Goal: Risk for impaired skin integrity will decrease Outcome: Progressing   Problem: Education: Goal: Ability to identify signs and symptoms of gastrointestinal bleeding will improve Outcome: Progressing   Problem: Bowel/Gastric: Goal: Will show no signs and symptoms of gastrointestinal bleeding Outcome: Progressing

## 2017-11-29 ENCOUNTER — Encounter: Payer: Self-pay | Admitting: Gastroenterology

## 2017-11-29 LAB — GLUCOSE, CAPILLARY
GLUCOSE-CAPILLARY: 177 mg/dL — AB (ref 65–99)
GLUCOSE-CAPILLARY: 231 mg/dL — AB (ref 65–99)

## 2017-11-29 LAB — CBC
HCT: 26.6 % — ABNORMAL LOW (ref 40.0–52.0)
HEMOGLOBIN: 9.1 g/dL — AB (ref 13.0–18.0)
MCH: 31.6 pg (ref 26.0–34.0)
MCHC: 34.2 g/dL (ref 32.0–36.0)
MCV: 92.6 fL (ref 80.0–100.0)
PLATELETS: 269 10*3/uL (ref 150–440)
RBC: 2.87 MIL/uL — AB (ref 4.40–5.90)
RDW: 16.8 % — ABNORMAL HIGH (ref 11.5–14.5)
WBC: 5.8 10*3/uL (ref 3.8–10.6)

## 2017-11-29 MED ORDER — VITAMIN B-12 1000 MCG PO TABS
1000.0000 ug | ORAL_TABLET | Freq: Every day | ORAL | 0 refills | Status: DC
Start: 2017-11-29 — End: 2019-06-25

## 2017-11-29 NOTE — Progress Notes (Signed)
Patient discharged home per MD order. All discharge instructions given and all questions answered.

## 2017-11-30 ENCOUNTER — Encounter: Payer: Self-pay | Admitting: Gastroenterology

## 2017-12-01 ENCOUNTER — Encounter: Payer: Self-pay | Admitting: Emergency Medicine

## 2017-12-01 ENCOUNTER — Observation Stay
Admission: EM | Admit: 2017-12-01 | Discharge: 2017-12-02 | Disposition: A | Discharge status: Home / Self Care | Payer: Medicare Other | Attending: Internal Medicine | Admitting: Internal Medicine

## 2017-12-01 ENCOUNTER — Other Ambulatory Visit: Payer: Self-pay

## 2017-12-01 DIAGNOSIS — K922 Gastrointestinal hemorrhage, unspecified: Secondary | ICD-10-CM | POA: Diagnosis not present

## 2017-12-01 DIAGNOSIS — R001 Bradycardia, unspecified: Secondary | ICD-10-CM | POA: Diagnosis not present

## 2017-12-01 DIAGNOSIS — K921 Melena: Secondary | ICD-10-CM | POA: Diagnosis not present

## 2017-12-01 DIAGNOSIS — Z87891 Personal history of nicotine dependence: Secondary | ICD-10-CM | POA: Insufficient documentation

## 2017-12-01 DIAGNOSIS — D649 Anemia, unspecified: Secondary | ICD-10-CM

## 2017-12-01 DIAGNOSIS — Z794 Long term (current) use of insulin: Secondary | ICD-10-CM | POA: Insufficient documentation

## 2017-12-01 DIAGNOSIS — D519 Vitamin B12 deficiency anemia, unspecified: Secondary | ICD-10-CM | POA: Diagnosis not present

## 2017-12-01 DIAGNOSIS — D500 Iron deficiency anemia secondary to blood loss (chronic): Secondary | ICD-10-CM

## 2017-12-01 DIAGNOSIS — I480 Paroxysmal atrial fibrillation: Secondary | ICD-10-CM | POA: Diagnosis not present

## 2017-12-01 DIAGNOSIS — E669 Obesity, unspecified: Secondary | ICD-10-CM | POA: Insufficient documentation

## 2017-12-01 DIAGNOSIS — E119 Type 2 diabetes mellitus without complications: Secondary | ICD-10-CM | POA: Diagnosis not present

## 2017-12-01 DIAGNOSIS — Z7901 Long term (current) use of anticoagulants: Secondary | ICD-10-CM | POA: Insufficient documentation

## 2017-12-01 DIAGNOSIS — I959 Hypotension, unspecified: Secondary | ICD-10-CM | POA: Diagnosis present

## 2017-12-01 DIAGNOSIS — D62 Acute posthemorrhagic anemia: Secondary | ICD-10-CM | POA: Diagnosis not present

## 2017-12-01 DIAGNOSIS — Z91013 Allergy to seafood: Secondary | ICD-10-CM | POA: Diagnosis not present

## 2017-12-01 DIAGNOSIS — I10 Essential (primary) hypertension: Secondary | ICD-10-CM | POA: Diagnosis not present

## 2017-12-01 DIAGNOSIS — J449 Chronic obstructive pulmonary disease, unspecified: Secondary | ICD-10-CM | POA: Insufficient documentation

## 2017-12-01 DIAGNOSIS — Z6828 Body mass index (BMI) 28.0-28.9, adult: Secondary | ICD-10-CM | POA: Insufficient documentation

## 2017-12-01 DIAGNOSIS — E785 Hyperlipidemia, unspecified: Secondary | ICD-10-CM | POA: Diagnosis not present

## 2017-12-01 DIAGNOSIS — Z79899 Other long term (current) drug therapy: Secondary | ICD-10-CM | POA: Diagnosis not present

## 2017-12-01 LAB — TROPONIN I: Troponin I: <0.03 ng/mL (ref <0.03)

## 2017-12-01 LAB — COMPREHENSIVE METABOLIC PANEL
ALT: 27 U/L (ref 17–63)
AST: 23 U/L (ref 15–41)
Albumin: 3.5 g/dL (ref 3.5–5.0)
Alkaline Phosphatase: 29 U/L — ABNORMAL LOW (ref 38–126)
Anion gap: 7 (ref 5–15)
BUN: 22 mg/dL — AB (ref 6–20)
CHLORIDE: 106 mmol/L (ref 101–111)
CO2: 22 mmol/L (ref 22–32)
CREATININE: 1.55 mg/dL — AB (ref 0.61–1.24)
Calcium: 8.6 mg/dL — ABNORMAL LOW (ref 8.9–10.3)
GFR calc Af Amer: 49 mL/min — ABNORMAL LOW (ref >60)
GFR calc non Af Amer: 42 mL/min — ABNORMAL LOW (ref >60)
GLUCOSE: 96 mg/dL (ref 65–99)
Potassium: 4.1 mmol/L (ref 3.5–5.1)
SODIUM: 135 mmol/L (ref 135–145)
Total Bilirubin: 0.8 mg/dL (ref 0.3–1.2)
Total Protein: 6.6 g/dL (ref 6.5–8.1)

## 2017-12-01 LAB — CBC
HEMATOCRIT: 27.2 % — AB (ref 40.0–52.0)
Hemoglobin: 9.2 g/dL — ABNORMAL LOW (ref 13.0–18.0)
MCH: 31.3 pg (ref 26.0–34.0)
MCHC: 33.9 g/dL (ref 32.0–36.0)
MCV: 92.1 fL (ref 80.0–100.0)
Platelets: 282 10*3/uL (ref 150–440)
RBC: 2.95 MIL/uL — ABNORMAL LOW (ref 4.40–5.90)
RDW: 16.8 % — AB (ref 11.5–14.5)
WBC: 6.3 10*3/uL (ref 3.8–10.6)

## 2017-12-01 LAB — TYPE AND SCREEN
ABO/RH(D): A POS
Antibody Screen: NEGATIVE

## 2017-12-01 LAB — TSH: TSH: 1.083 u[IU]/mL (ref 0.350–4.500)

## 2017-12-01 LAB — IRON AND TIBC
Iron: 20 ug/dL — ABNORMAL LOW (ref 45–182)
SATURATION RATIOS: 5 % — AB (ref 17.9–39.5)
TIBC: 407 ug/dL (ref 250–450)
UIBC: 387 ug/dL

## 2017-12-01 LAB — HEMOGLOBIN AND HEMATOCRIT, BLOOD
HCT: 26.2 % — ABNORMAL LOW (ref 40.0–52.0)
HEMATOCRIT: 27.4 % — AB (ref 40.0–52.0)
HEMOGLOBIN: 9.3 g/dL — AB (ref 13.0–18.0)
Hemoglobin: 8.9 g/dL — ABNORMAL LOW (ref 13.0–18.0)

## 2017-12-01 LAB — FERRITIN: FERRITIN: 29 ng/mL (ref 24–336)

## 2017-12-01 LAB — GLUCOSE, CAPILLARY: Glucose-Capillary: 134 mg/dL — ABNORMAL HIGH (ref 65–99)

## 2017-12-01 LAB — VITAMIN B12: Vitamin B-12: 1096 pg/mL — ABNORMAL HIGH (ref 180–914)

## 2017-12-01 MED ORDER — SODIUM CHLORIDE 0.9 % IV SOLN
80.0000 mg | Freq: Once | INTRAVENOUS | Status: AC
  Administered 2017-12-01: 80 mg via INTRAVENOUS
  Filled 2017-12-01: qty 80

## 2017-12-01 MED ORDER — INSULIN ASPART 100 UNIT/ML ~~LOC~~ SOLN
25.0000 [IU] | Freq: Two times a day (BID) | SUBCUTANEOUS | Status: DC
  Administered 2017-12-02: 25 [IU] via SUBCUTANEOUS
  Filled 2017-12-01: qty 1

## 2017-12-01 MED ORDER — BISACODYL 10 MG RE SUPP: 10.0000 mg | Freq: Every day | RECTAL | Status: DC | PRN

## 2017-12-01 MED ORDER — SODIUM CHLORIDE 0.9% FLUSH: 3.0000 mL | INTRAVENOUS | Status: DC | PRN

## 2017-12-01 MED ORDER — TAMSULOSIN HCL 0.4 MG PO CAPS
0.4000 mg | ORAL_CAPSULE | Freq: Every evening | ORAL | Status: DC
  Administered 2017-12-01: 0.4 mg via ORAL
  Filled 2017-12-01: qty 1

## 2017-12-01 MED ORDER — SODIUM CHLORIDE 0.9 % IV SOLN
400.0000 mg | Freq: Once | INTRAVENOUS | Status: AC
  Administered 2017-12-02: 400 mg via INTRAVENOUS
  Filled 2017-12-01: qty 20

## 2017-12-01 MED ORDER — INSULIN GLARGINE 100 UNIT/ML ~~LOC~~ SOLN
80.0000 [IU] | Freq: Every day | SUBCUTANEOUS | Status: DC
  Administered 2017-12-01: 80 [IU] via SUBCUTANEOUS
  Filled 2017-12-01 (×2): qty 0.8

## 2017-12-01 MED ORDER — IPRATROPIUM-ALBUTEROL 0.5-2.5 (3) MG/3ML IN SOLN
3.0000 mL | Freq: Four times a day (QID) | RESPIRATORY_TRACT | Status: DC | PRN
Start: 2017-12-01 — End: 2017-12-02

## 2017-12-01 MED ORDER — SENNOSIDES-DOCUSATE SODIUM 8.6-50 MG PO TABS: 1.0000 | ORAL_TABLET | Freq: Every evening | ORAL | Status: DC | PRN

## 2017-12-01 MED ORDER — SODIUM CHLORIDE 0.9 % IV SOLN
510.0000 mg | Freq: Once | INTRAVENOUS | Status: AC
  Administered 2017-12-01: 510 mg via INTRAVENOUS
  Filled 2017-12-01: qty 17

## 2017-12-01 MED ORDER — INSULIN DEGLUDEC 100 UNIT/ML ~~LOC~~ SOPN: 80.0000 [IU] | PEN_INJECTOR | Freq: Every day | SUBCUTANEOUS | Status: DC

## 2017-12-01 MED ORDER — INSULIN ASPART 100 UNIT/ML ~~LOC~~ SOLN
0.0000 [IU] | Freq: Three times a day (TID) | SUBCUTANEOUS | Status: DC
  Administered 2017-12-02: 2 [IU] via SUBCUTANEOUS
  Filled 2017-12-01 (×2): qty 1

## 2017-12-01 MED ORDER — CYANOCOBALAMIN 1000 MCG/ML IJ SOLN
1000.0000 ug | Freq: Every day | INTRAMUSCULAR | Status: DC
  Administered 2017-12-01 – 2017-12-02 (×2): 1000 ug via INTRAMUSCULAR
  Filled 2017-12-01 (×2): qty 1

## 2017-12-01 MED ORDER — OMEGA-3-ACID ETHYL ESTERS 1 G PO CAPS
4.0000 g | ORAL_CAPSULE | Freq: Every day | ORAL | Status: DC
  Administered 2017-12-02: 4 g via ORAL
  Filled 2017-12-01: qty 4

## 2017-12-01 MED ORDER — HYDROCODONE-ACETAMINOPHEN 5-325 MG PO TABS: 1.0000 | ORAL_TABLET | ORAL | Status: DC | PRN

## 2017-12-01 MED ORDER — FLUTICASONE FUROATE-VILANTEROL 100-25 MCG/INH IN AEPB
1.0000 | INHALATION_SPRAY | Freq: Every day | RESPIRATORY_TRACT | Status: DC
  Administered 2017-12-02: 1 via RESPIRATORY_TRACT
  Filled 2017-12-01: qty 28

## 2017-12-01 MED ORDER — ALLOPURINOL 100 MG PO TABS
100.0000 mg | ORAL_TABLET | Freq: Every day | ORAL | Status: DC
  Administered 2017-12-02: 100 mg via ORAL
  Filled 2017-12-01: qty 1

## 2017-12-01 MED ORDER — SODIUM CHLORIDE 0.9 % IV SOLN
100.0000 mg | Freq: Once | INTRAVENOUS | Status: DC
  Filled 2017-12-01: qty 2

## 2017-12-01 MED ORDER — VITAMIN B-12 1000 MCG PO TABS: 1000.0000 ug | ORAL_TABLET | Freq: Every day | ORAL | Status: DC

## 2017-12-01 MED ORDER — DABIGATRAN ETEXILATE MESYLATE 150 MG PO CAPS
150.0000 mg | ORAL_CAPSULE | Freq: Two times a day (BID) | ORAL | Status: DC
  Administered 2017-12-01 – 2017-12-02 (×2): 150 mg via ORAL
  Filled 2017-12-01 (×3): qty 1

## 2017-12-01 MED ORDER — INSULIN ASPART 100 UNIT/ML ~~LOC~~ SOLN: 15.0000 [IU] | Freq: Three times a day (TID) | SUBCUTANEOUS | Status: DC

## 2017-12-01 MED ORDER — SODIUM CHLORIDE 0.9% FLUSH
3.0000 mL | Freq: Two times a day (BID) | INTRAVENOUS | Status: DC
  Administered 2017-12-02: 3 mL via INTRAVENOUS

## 2017-12-01 MED ORDER — INSULIN ASPART 100 UNIT/ML ~~LOC~~ SOLN: 0.0000 [IU] | Freq: Every day | SUBCUTANEOUS | Status: DC

## 2017-12-01 MED ORDER — HYDRALAZINE HCL 20 MG/ML IJ SOLN: 10.0000 mg | INTRAMUSCULAR | Status: DC | PRN

## 2017-12-01 MED ORDER — FLEET ENEMA 7-19 GM/118ML RE ENEM: 1.0000 | ENEMA | Freq: Once | RECTAL | Status: DC | PRN

## 2017-12-01 MED ORDER — PANTOPRAZOLE SODIUM 40 MG IV SOLR: 40.0000 mg | Freq: Two times a day (BID) | INTRAVENOUS | Status: DC

## 2017-12-01 MED ORDER — SODIUM CHLORIDE 0.9 % IV SOLN: 250.0000 mL | INTRAVENOUS | Status: DC | PRN

## 2017-12-01 MED ORDER — ACETAMINOPHEN 325 MG PO TABS: 650.0000 mg | ORAL_TABLET | Freq: Four times a day (QID) | ORAL | Status: DC | PRN

## 2017-12-01 MED ORDER — ATORVASTATIN CALCIUM 20 MG PO TABS
20.0000 mg | ORAL_TABLET | Freq: Every day | ORAL | Status: DC
  Administered 2017-12-01: 20 mg via ORAL
  Filled 2017-12-01: qty 1

## 2017-12-01 MED ORDER — ACETAMINOPHEN 650 MG RE SUPP: 650.0000 mg | Freq: Four times a day (QID) | RECTAL | Status: DC | PRN

## 2017-12-01 MED ORDER — SODIUM CHLORIDE 0.9 % IV SOLN
8.0000 mg/h | INTRAVENOUS | Status: DC
  Administered 2017-12-01 – 2017-12-02 (×3): 8 mg/h via INTRAVENOUS
  Filled 2017-12-01 (×3): qty 80

## 2017-12-01 MED ORDER — INSULIN ASPART 100 UNIT/ML ~~LOC~~ SOLN
15.0000 [IU] | Freq: Every day | SUBCUTANEOUS | Status: DC
  Administered 2017-12-02: 15 [IU] via SUBCUTANEOUS
  Filled 2017-12-01: qty 1

## 2017-12-01 NOTE — H&P (Addendum)
[facility] NAME: Jay Morris    MR#:  [id number]  DATE OF BIRTH:  Nov 22, 1945  DATE OF ADMISSION:  12/01/2017  PRIMARY CARE PHYSICIAN: [name], MD   REQUESTING/REFERRING PHYSICIAN:   CHIEF COMPLAINT:   Chief Complaint  Patient presents with  . Hypotension  . Anemia    HISTORY OF PRESENT ILLNESS: Jay Morris  is a 72 y.o. male with a known history per below, recently discharged from the hospital for acute GI bleeding status post transfusion, had EGD/colonoscopy/small bowel endoscopy which were all normal, returns to the ER from the [facility] for low blood pressure, per ED attending whom discussed case with the patient's primary care provider/Dr. Sparks-recommended admission overnight for observation with IV iron/also discussed with gastroenterology who recommended IV iron with consultation, in the emergency room systolic blood pressure was in the 90s, heart rate between 47-53, hemoglobin 9.2 which is stable from last check, creatinine 1.5 at baseline, EKG noted for A. fib with heart rate of 50, on evaluation in the emergency room patient is going in between sinus rhythm and A. fib on monitor with persistent heart rate of 48-50, patient without complaint, wife at the bedside, patient continues to complain of dark stools, patient is now been admitted for acute symptomatic bradycardia, and acute on chronic melena.  PAST MEDICAL HISTORY:   Past Medical History:  Diagnosis Date  . Asthma   . Atrial fibrillation ([place])   . BPH (benign prostatic hyperplasia)   . COPD (chronic obstructive pulmonary disease) ([place])   . Diabetes mellitus without complication ([place])   . Helicobacter pylori gastritis   . Hyperlipidemia   . Hypertension   . Pneumonia     PAST SURGICAL HISTORY:  Past Surgical History:  Procedure Laterality Date  . CARDIAC ELECTROPHYSIOLOGY MAPPING AND ABLATION    . COLONOSCOPY WITH PROPOFOL N/A 06/15/2017    Procedure: COLONOSCOPY WITH PROPOFOL;  Surgeon: Toledo, Benay Pike, MD;  Location: [facility] ENDOSCOPY;  Service: Gastroenterology;  Laterality: N/A;  . ENTEROSCOPY N/A 11/24/2017   Procedure: ENTEROSCOPY;  Surgeon: Jonathon Bellows, MD;  Location: [facility] ENDOSCOPY;  Service: Gastroenterology;  Laterality: N/A;  . ESOPHAGOGASTRODUODENOSCOPY N/A 11/27/2017   Procedure: ESOPHAGOGASTRODUODENOSCOPY (EGD);  Surgeon: Lucilla Lame, MD;  Location: [facility] ENDOSCOPY;  Service: Endoscopy;  Laterality: N/A;  . ESOPHAGOGASTRODUODENOSCOPY (EGD) WITH PROPOFOL N/A 06/02/2017   Procedure: ESOPHAGOGASTRODUODENOSCOPY (EGD) WITH PROPOFOL;  Surgeon: Toledo, Benay Pike, MD;  Location: [facility] ENDOSCOPY;  Service: Gastroenterology;  Laterality: N/A;  . GIVENS CAPSULE STUDY N/A 11/25/2017   Procedure: GIVENS CAPSULE STUDY;  Surgeon: Jonathon Bellows, MD;  Location: [facility] ENDOSCOPY;  Service: Gastroenterology;  Laterality: N/A;  . HAND SURGERY      SOCIAL HISTORY:  Social History   Tobacco Use  . Smoking status: Former Smoker    Packs/day: 1.00    Years: 30.00    Pack years: 30.00    Types: Cigarettes  . Smokeless tobacco: Never Used  Substance Use Topics  . Alcohol use: No    Alcohol/week: 0.0 oz    Comment: occasional    FAMILY HISTORY:  Family History  Problem Relation Age of Onset  . Heart disease Mother   . Cancer Father     DRUG ALLERGIES:  Allergies  Allergen Reactions  . Shrimp [Shellfish Allergy] Anaphylaxis    REVIEW OF SYSTEMS:   CONSTITUTIONAL: No fever, +fatigue, weakness.  EYES: No blurred or double vision.  EARS, NOSE, AND THROAT: No tinnitus or ear pain.  RESPIRATORY: No cough, shortness of breath, wheezing or hemoptysis.  CARDIOVASCULAR: No chest pain, orthopnea, edema.  GASTROINTESTINAL: No nausea, vomiting, diarrhea or abdominal pain.  GENITOURINARY: No dysuria, hematuria.  ENDOCRINE: No polyuria, nocturia,  HEMATOLOGY: No anemia, easy bruising or bleeding SKIN: No rash or  lesion. MUSCULOSKELETAL: No joint pain or arthritis.   NEUROLOGIC: No tingling, numbness, weakness. + Lightheadedness PSYCHIATRY: No anxiety or depression.   MEDICATIONS AT HOME:  Prior to Admission medications   Medication Sig Start Date End Date Taking? Authorizing Provider  allopurinol (ZYLOPRIM) 100 MG tablet Take 100 mg by mouth daily.    [provider]  atorvastatin (LIPITOR) 20 MG tablet Take 20 mg by mouth at bedtime. 12/09/15   [provider]  BREO ELLIPTA 100-25 MCG/INH AEPB Inhale 1 puff into the lungs daily. 12/04/15   [provider]  carvedilol (COREG) 6.25 MG tablet Take 12.5 mg by mouth 2 (two) times daily. 11/02/17   [provider]  furosemide (LASIX) 20 MG tablet Take 20 mg by mouth daily. 12/29/15   [provider]  indomethacin (INDOCIN) 50 MG capsule Take 50 mg by mouth 3 (three) times daily as needed (gout).    [provider]  insulin aspart (NOVOLOG) 100 UNIT/ML injection Inject 15-25 Units into the skin 3 (three) times daily before meals. Inject 25 units before breakfast, 15 units before lunch and 25 units before dinner.    [provider]  insulin degludec (TRESIBA FLEXTOUCH) 100 UNIT/ML SOPN FlexTouch Pen Inject 100 Units into the skin daily at 10 pm.     [provider]  lisinopril (PRINIVIL,ZESTRIL) 40 MG tablet Take 40 mg by mouth at bedtime.  12/29/15   [provider]  metFORMIN (GLUCOPHAGE) 1000 MG tablet Take 1,000 mg by mouth 2 (two) times daily. 12/17/15   [provider]  omega-3 acid ethyl esters (LOVAZA) 1 g capsule Take 4 g by mouth daily. 12/29/15   [provider]  PRADAXA 150 MG CAPS capsule Take 150 mg by mouth 2 (two) times daily. 12/11/15   [provider]  spironolactone (ALDACTONE) 25 MG tablet Take 25 mg daily by mouth.    [provider]  tamsulosin (FLOMAX) 0.4 MG CAPS capsule Take 0.4 mg by mouth every evening.  12/17/15   [provider]  vitamin B-12 (CYANOCOBALAMIN) 1000 MCG tablet Take 1 tablet (1,000 mcg total) by mouth daily. 11/29/17   Epifanio Lesches, MD      PHYSICAL EXAMINATION:   VITAL SIGNS: Blood pressure (!) 115/50, pulse (!) 48, temperature 98.4 F (36.9 C), temperature source Oral, resp. rate 15, SpO2 99 %.  GENERAL:  73 y.o.-year-old patient lying in the bed with no acute distress.  EYES: Pupils equal, round, reactive to light and accommodation. No scleral icterus. Extraocular muscles intact.  HEENT: Head atraumatic, normocephalic. Oropharynx and nasopharynx clear.  NECK:  Supple, no jugular venous distention. No thyroid enlargement, no tenderness.  LUNGS: Normal breath sounds bilaterally, no wheezing, rales,rhonchi or crepitation. No use of accessory muscles of respiration.  CARDIOVASCULAR: S1, S2 normal. No murmurs, rubs, or gallops.  ABDOMEN: Soft, nontender, nondistended. Bowel sounds present. No organomegaly or mass.  EXTREMITIES: No pedal edema, cyanosis, or clubbing.  NEUROLOGIC: Cranial nerves II through XII are intact. MAES. Gait not checked.  PSYCHIATRIC: The patient is alert and oriented x 3.  SKIN: No obvious rash, lesion, or ulcer.   LABORATORY PANEL:   CBC Recent Labs  Lab 11/25/17 0455 11/26/17 0422 11/27/17 1230  11/29/17 1000 12/01/17 1146  WBC 6.9 6.0 5.7 5.8 6.3  HGB 7.8* 8.4* 8.4* 9.1* 9.2*  HCT 22.4* 23.3* 24.3* 26.6* 27.2*  PLT 185 210 245 269 282  MCV 91.2 91.1 92.6 92.6 92.1  MCH 31.8 33.0 32.1 31.6 31.3  MCHC 34.9 36.2* 34.7 34.2 33.9  RDW 16.8* 16.5* 17.4* 16.8* 16.8*   ------------------------------------------------------------------------------------------------------------------  Chemistries  Recent Labs  Lab 12/01/17 1146  NA 135  K 4.1  CL 106  CO2 22  GLUCOSE 96  BUN 22*  CREATININE 1.55*  CALCIUM 8.6*  AST 23  ALT 27  ALKPHOS 29*  BILITOT 0.8    ------------------------------------------------------------------------------------------------------------------ estimated creatinine clearance is 45.5 mL/min (A) (by C-G formula based on SCr of 1.55 mg/dL (H)). ------------------------------------------------------------------------------------------------------------------ No results for input(s): TSH, T4TOTAL, T3FREE, THYROIDAB in the last 72 hours.  Invalid input(s): FREET3   Coagulation profile No results for input(s): INR, PROTIME in the last 168 hours. ------------------------------------------------------------------------------------------------------------------- No results for input(s): DDIMER in the last 72 hours. -------------------------------------------------------------------------------------------------------------------  Cardiac Enzymes No results for input(s): CKMB, TROPONINI, MYOGLOBIN in the last 168 hours.  Invalid input(s): CK ------------------------------------------------------------------------------------------------------------------ Invalid input(s): POCBNP  ---------------------------------------------------------------------------------------------------------------  Urinalysis    Component Value Date/Time   COLORURINE YELLOW (A) 01/05/2016 0408   APPEARANCEUR CLEAR (A) 01/05/2016 0408   LABSPEC 1.020 01/05/2016 0408   PHURINE 5.0 01/05/2016 0408   GLUCOSEU 50 (A) 01/05/2016 0408   HGBUR NEGATIVE 01/05/2016 0408   BILIRUBINUR NEGATIVE 01/05/2016 0408   KETONESUR NEGATIVE 01/05/2016 0408   PROTEINUR 100 (A) 01/05/2016 0408   NITRITE NEGATIVE 01/05/2016 0408   LEUKOCYTESUR NEGATIVE 01/05/2016 0408     RADIOLOGY: No results found.  EKG: Orders placed or performed during the hospital encounter of 12/01/17  . EKG 12-Lead  . EKG 12-Lead    IMPRESSION AND PLAN: *Acute symptomatic bradycardia with hypotension Referred to the observation unit Most likely secondary to AV nodal blocking  agents for A. fib Admit to telemetry bed, check orthostatics now and in the morning, hold AV nodal blocking agents/antihypertensives agents, rule out acute current syndrome with cardiac enzymes x3 sets, cardiology to see, fall precautions, and continue close medical monitoring  *Acute blood loss anemia Continues to complain of dark stools Stable hemoglobin compared to last check Noted recent hospital admission for GI bleeding/melena s/p transfusion for which EGD/colonoscopy/small bowel endoscopy were normal per Dr. Vanga/gastroenterology's last notes Protonix drip for now, strict I&O monitoring, gastroenterology to see, IV iron x1, type and screen, H&H every 6 hours, CBC daily, transfuse as needed  *Chronic paroxysmal A. Fib Stable Continue Pradaxa, hold AV nodal blocking agents as stated above given symptomatic bradycardia  *COPD without exacerbation Stable Breathing treatments as needed  *Chronic diabetes mellitus type 2 Stable Sliding scale insulin with Accu-Cheks per routine, reduce home dose of long-acting and mealtime insulin, hold metformin  *Chronic benign essential hypertension Currently hypotensive Continue to hold antihypertensives for now, PRN hydralazine for systolic blood pressure greater than 160, vitals per routine, make changes as per necessary   All the records are reviewed and case discussed with ED provider. Management plans discussed with the patient, family and they are in agreement.  CODE STATUS:full Code Status History    Date Active Date Inactive Code Status Order ID Comments User Context   11/21/2017 1526 11/29/2017 1800 Full Code [id number]  Henreitta Leber, MD Inpatient   01/04/2016 2005 01/09/2016 1908 Full Code [id number]  Henreitta Leber, MD Inpatient       TOTAL TIME TAKING CARE  OF THIS PATIENT: 45 minutes.    Avel Peace Tameah Mihalko M.D on 12/01/2017   Between 7am to 6pm - Pager - [phone]  After 6pm go to [email or web address] - password [email or web address]  [facility]  Office  [phone]  CC: Primary care physician; [name], MD   Note: This dictation was prepared with Dragon dictation along with smaller phrase technology. Any transcriptional errors that result from this process are unintentional.

## 2017-12-01 NOTE — ED Triage Notes (Signed)
Sent from [facility] for low blood pressure and needing transfusion.  Was admitted few weeks a goand given several units of blood.  Says stool still is black.  They had already done endoscopy as well.  Pt in nad.

## 2017-12-01 NOTE — ED Provider Notes (Signed)
[facility] Emergency Department Provider Note       Time seen: ----------------------------------------- 2:12 PM on 12/01/2017 -----------------------------------------   I have reviewed the triage vital signs and the nursing notes.  HISTORY   Chief Complaint Hypotension and Anemia    HPI Jay Morris is a 73 y.o. male with a history of asthma, atrial fibrillation, COPD, diabetes, H. pylori gastritis, hyperlipidemia and hypertension with recent GI bleeding who presents to the ED for low blood pressure and possible need for transfusion.  He was admitted a few weeks ago and was given several units of blood.  No specific etiology was discovered as to the source of his bleeding.  Patient reports his stool is still black.  Past Medical History:  Diagnosis Date  . Asthma   . Atrial fibrillation ([place])   . BPH (benign prostatic hyperplasia)   . COPD (chronic obstructive pulmonary disease) ([place])   . Diabetes mellitus without complication ([place])   . Helicobacter pylori gastritis   . Hyperlipidemia   . Hypertension   . Pneumonia     Patient Active Problem List   Diagnosis Date Noted  . Iron deficiency anemia due to chronic blood loss   . GI bleed 11/21/2017  . Pneumonia 01/04/2016    Past Surgical History:  Procedure Laterality Date  . CARDIAC ELECTROPHYSIOLOGY MAPPING AND ABLATION    . COLONOSCOPY WITH PROPOFOL N/A 06/15/2017   Procedure: COLONOSCOPY WITH PROPOFOL;  Surgeon: Toledo, Benay Pike, MD;  Location: [facility] ENDOSCOPY;  Service: Gastroenterology;  Laterality: N/A;  . ENTEROSCOPY N/A 11/24/2017   Procedure: ENTEROSCOPY;  Surgeon: Jonathon Bellows, MD;  Location: [facility] ENDOSCOPY;  Service: Gastroenterology;  Laterality: N/A;  . ESOPHAGOGASTRODUODENOSCOPY N/A 11/27/2017   Procedure: ESOPHAGOGASTRODUODENOSCOPY (EGD);  Surgeon: Lucilla Lame, MD;  Location: [facility] ENDOSCOPY;  Service: Endoscopy;  Laterality: N/A;  . ESOPHAGOGASTRODUODENOSCOPY (EGD) WITH PROPOFOL  N/A 06/02/2017   Procedure: ESOPHAGOGASTRODUODENOSCOPY (EGD) WITH PROPOFOL;  Surgeon: Toledo, Benay Pike, MD;  Location: [facility] ENDOSCOPY;  Service: Gastroenterology;  Laterality: N/A;  . GIVENS CAPSULE STUDY N/A 11/25/2017   Procedure: GIVENS CAPSULE STUDY;  Surgeon: Jonathon Bellows, MD;  Location: [facility] ENDOSCOPY;  Service: Gastroenterology;  Laterality: N/A;  . HAND SURGERY      Allergies Shrimp [shellfish allergy]  Social History Social History   Tobacco Use  . Smoking status: Former Smoker    Packs/day: 1.00    Years: 30.00    Pack years: 30.00    Types: Cigarettes  . Smokeless tobacco: Never Used  Substance Use Topics  . Alcohol use: No    Alcohol/week: 0.0 oz    Comment: occasional  . Drug use: No   Review of Systems Constitutional: Negative for fever. Cardiovascular: Negative for chest pain. Respiratory: Negative for shortness of breath. Gastrointestinal: Negative for abdominal pain, vomiting and diarrhea.  Positive for black stools Musculoskeletal: Negative for back pain. Skin: Negative for rash. Neurological: Negative for headaches, positive for weakness  All systems negative/normal/unremarkable except as stated in the HPI  ____________________________________________   PHYSICAL EXAM:  VITAL SIGNS: ED Triage Vitals [12/01/17 1138]  Enc Vitals Group     BP (!) 98/39     Pulse Rate (!) 53     Resp 18     Temp 98.4 F (36.9 C)     Temp Source Oral     SpO2 100 %     Weight      Height      Head Circumference      Peak Flow  Pain Score 0     Pain Loc      Pain Edu?      Excl. in [place]?    Constitutional: Alert and oriented. Well appearing and in no distress. Eyes: Conjunctivae are normal. Normal extraocular movements. ENT   Head: Normocephalic and atraumatic.   Nose: No congestion/rhinnorhea.   Mouth/Throat: Mucous membranes are moist.   Neck: No stridor. Cardiovascular: Normal rate, regular rhythm. No murmurs, rubs, or  gallops. Respiratory: Normal respiratory effort without tachypnea nor retractions. Breath sounds are clear and equal bilaterally. No wheezes/rales/rhonchi. Gastrointestinal: Soft and nontender. Normal bowel sounds Rectal: Black stool, heme positive Musculoskeletal: Nontender with normal range of motion in extremities. No lower extremity tenderness nor edema. Neurologic:  Normal speech and language. No gross focal neurologic deficits are appreciated.  Skin:  Skin is warm, dry and intact. No rash noted. Psychiatric: Mood and affect are normal. Speech and behavior are normal.  ____________________________________________  EKG: Interpreted by me.  Sinus bradycardia with a rate of 49 bpm, normal PR interval, normal QRS, normal QT.  ____________________________________________  ED COURSE:  As part of my medical decision making, I reviewed the following data within the [place] History obtained from family if available, nursing notes, old chart and ekg, as well as notes from prior ED visits. Patient presented for hypotension and anemia, we will assess with labs and imaging as indicated at this time.   Procedures ____________________________________________   LABS (pertinent positives/negatives)  Labs Reviewed  COMPREHENSIVE METABOLIC PANEL - Abnormal; Notable for the following components:      Result Value   BUN 22 (*)    Creatinine, Ser 1.55 (*)    Calcium 8.6 (*)    Alkaline Phosphatase 29 (*)    GFR calc non Af Amer 42 (*)    GFR calc Af Amer 49 (*)    All other components within normal limits  CBC - Abnormal; Notable for the following components:   RBC 2.95 (*)    Hemoglobin 9.2 (*)    HCT 27.2 (*)    RDW 16.8 (*)    All other components within normal limits  VITAMIN B12  IRON AND TIBC  FERRITIN  TYPE AND SCREEN  ____________________________________________  DIFFERENTIAL DIAGNOSIS   Anemia, ongoing GI bleeding, dehydration, electrolyte  abnormality  FINAL ASSESSMENT AND PLAN  Anemia, GI bleeding   Plan: The patient had presented for weakness with a history of anemia and GI bleeding. Patient's labs did not reveal any significant drop in his hemoglobin.  I have discussed with GI and his primary care doctor.  We will hold his Pradaxa and admit him for observation as he had does have symptomatic anemia. Family is agreeable to this plan.   Laurence Aly, MD   Note: This note was generated in part or whole with voice recognition software. Voice recognition is usually quite accurate but there are transcription errors that can and very often do occur. I apologize for any typographical errors that were not detected and corrected.     Earleen Newport, MD 12/01/17 1452

## 2017-12-01 NOTE — Consult Note (Signed)
[name], MD [address]  [place]  [place], [place] [address]  Main: [phone]  Fax: [phone] Pager: [phone]   Consultation  Referring Provider:     No ref. provider found Primary Care Physician:  [name], MD Primary Gastroenterologist:  Dr. Alice Reichert         Reason for Consultation:     Symptomatic anemia, black stools  Date of Admission:  12/01/2017 Date of Consultation:  12/01/2017         HPI:   Jay Morris is a 72 y.o. male with history of A. fib, on pradaxa with recent hospital admission for worsening anemia and melena. He underwent small bowel enteroscopy with no source of bleeding identified, subsequently underwent capsule endoscopy which was unremarkable. He also has severe vitamin B12 deficiency, for which he is just started on oral B12 supplementation. He was discharged home on 11/28/2017. He was seen at [facility] primary care and was found to have low blood pressure , sent to ER. Labs revealed hemoglobin of 8.5 at [facility]. His repeat hemoglobin in the ER was 9.2, his last hemoglobin was 9.1 on 11/29/2017. Patient reports ongoing black stools and weakness. He has not been taking iron supplementation. He is taking oral B12 daily. He continues to take Pradaxa  He is admitted for observation   NSAIDs: None  Antiplts/Anticoagulants/Anti thrombotics: pradaxa for A. fib  GI Procedures: EGD and colonoscopy by Dr. Alice Reichert in 05/2017 No source of GI bleed was identified, gastric biopsies negative for H. Pylori Small bowel endoscopy was normal VCE was normal  Past Medical History:  Diagnosis Date  . Asthma   . Atrial fibrillation ([place])   . BPH (benign prostatic hyperplasia)   . COPD (chronic obstructive pulmonary disease) ([place])   . Diabetes mellitus without complication ([place])   . Helicobacter pylori gastritis   . Hyperlipidemia   . Hypertension   . Pneumonia     Past Surgical History:  Procedure Laterality Date  . CARDIAC ELECTROPHYSIOLOGY  MAPPING AND ABLATION    . COLONOSCOPY WITH PROPOFOL N/A 06/15/2017   Procedure: COLONOSCOPY WITH PROPOFOL;  Surgeon: Toledo, Benay Pike, MD;  Location: [facility] ENDOSCOPY;  Service: Gastroenterology;  Laterality: N/A;  . ENTEROSCOPY N/A 11/24/2017   Procedure: ENTEROSCOPY;  Surgeon: Jonathon Bellows, MD;  Location: [facility] ENDOSCOPY;  Service: Gastroenterology;  Laterality: N/A;  . ESOPHAGOGASTRODUODENOSCOPY N/A 11/27/2017   Procedure: ESOPHAGOGASTRODUODENOSCOPY (EGD);  Surgeon: Lucilla Lame, MD;  Location: [facility] ENDOSCOPY;  Service: Endoscopy;  Laterality: N/A;  . ESOPHAGOGASTRODUODENOSCOPY (EGD) WITH PROPOFOL N/A 06/02/2017   Procedure: ESOPHAGOGASTRODUODENOSCOPY (EGD) WITH PROPOFOL;  Surgeon: Toledo, Benay Pike, MD;  Location: [facility] ENDOSCOPY;  Service: Gastroenterology;  Laterality: N/A;  . GIVENS CAPSULE STUDY N/A 11/25/2017   Procedure: GIVENS CAPSULE STUDY;  Surgeon: Jonathon Bellows, MD;  Location: [facility] ENDOSCOPY;  Service: Gastroenterology;  Laterality: N/A;  . HAND SURGERY      Prior to Admission medications   Medication Sig Start Date End Date Taking? Authorizing Provider  allopurinol (ZYLOPRIM) 100 MG tablet Take 100 mg by mouth daily.    [provider]  atorvastatin (LIPITOR) 20 MG tablet Take 20 mg by mouth at bedtime. 12/09/15   [provider]  BREO ELLIPTA 100-25 MCG/INH AEPB Inhale 1 puff into the lungs daily. 12/04/15   [provider]  carvedilol (COREG) 6.25 MG tablet Take 12.5 mg by mouth 2 (two) times daily. 11/02/17   [provider]  furosemide (LASIX) 20 MG tablet Take 20 mg by  mouth daily. 12/29/15   [provider]  indomethacin (INDOCIN) 50 MG capsule Take 50 mg by mouth 3 (three) times daily as needed (gout).    [provider]  insulin aspart (NOVOLOG) 100 UNIT/ML injection Inject 15-25 Units into the skin 3 (three) times daily before meals. Inject 25 units before breakfast, 15 units before lunch and 25 units before dinner.    [provider]  insulin degludec (TRESIBA FLEXTOUCH) 100 UNIT/ML SOPN FlexTouch Pen Inject 100 Units into the skin daily at 10 pm.     [provider]  lisinopril (PRINIVIL,ZESTRIL) 40 MG tablet Take 40 mg by mouth at bedtime.  12/29/15   [provider]  metFORMIN (GLUCOPHAGE) 1000 MG tablet Take 1,000 mg by mouth 2 (two) times daily. 12/17/15   [provider]  omega-3 acid ethyl esters (LOVAZA) 1 g capsule Take 4 g by mouth daily. 12/29/15   [provider]  PRADAXA 150 MG CAPS capsule Take 150 mg by mouth 2 (two) times daily. 12/11/15   [provider]  spironolactone (ALDACTONE) 25 MG tablet Take 25 mg daily by mouth.    [provider]  tamsulosin (FLOMAX) 0.4 MG CAPS capsule Take 0.4 mg by mouth every evening.  12/17/15   [provider]  vitamin B-12 (CYANOCOBALAMIN) 1000 MCG tablet Take 1 tablet (1,000 mcg total) by mouth daily. 11/29/17   Epifanio Lesches, MD    Family History  Problem Relation Age of Onset  . Heart disease Mother   . Cancer Father      Social History   Tobacco Use  . Smoking status: Former Smoker    Packs/day: 1.00    Years: 30.00    Pack years: 30.00    Types: Cigarettes  . Smokeless tobacco: Never Used  Substance Use Topics  . Alcohol use: No    Alcohol/week: 0.0 oz    Comment: occasional  . Drug use: No    Allergies as of 12/01/2017 - Review Complete 12/01/2017  Allergen Reaction Noted  . Shrimp [shellfish allergy] Anaphylaxis 11/21/2017    Review of Systems:    All systems reviewed and negative except where noted in HPI.   Physical Exam:  Vital signs in last 24 hours: Temp:  [97.5 F (36.4 C)-98.4 F (36.9 C)] 97.5 F (36.4 C) (05/16 1651) Pulse Rate:  [47-53] 51 (05/16 1651) Resp:  [14-22] 14 (05/16 1651) BP: (98-135)/(39-67) 131/67 (05/16 1651) SpO2:  [96 %-100 %] 100 % (05/16 1651) Weight:  [186 lb 11.7 oz (84.7 kg)] 186 lb 11.7 oz (84.7 kg) (05/16 1652) Last BM Date:  12/01/17 General:   Pleasant, cooperative in NAD Head:  Normocephalic and atraumatic. Eyes:   No icterus.   Conjunctiva pale. PERRLA. Ears:  Normal auditory acuity. Neck:  Supple; no masses or thyroidomegaly Lungs: Respirations even and unlabored. Lungs clear to auscultation bilaterally.   No wheezes, crackles, or rhonchi.  Heart:  Regular rate and rhythm;  Without murmur, clicks, rubs or gallops Abdomen:  Soft, nondistended, nontender. Normal bowel sounds. No appreciable masses or hepatomegaly.  No rebound or guarding.  Rectal:  Not performed. Msk:  Symmetrical without gross deformities.  Strength normal  Extremities:  Without edema, cyanosis or clubbing. Neurologic:  Alert and oriented x3;  grossly normal neurologically. Skin:  Intact without significant lesions or rashes. Cervical Nodes:  No significant cervical adenopathy. Psych:  Alert and cooperative. Normal affect.  LAB RESULTS: CBC Latest Ref Rng & Units 12/01/2017 12/01/2017 11/29/2017  WBC 3.8 -  10.6 K/uL - 6.3 5.8  Hemoglobin 13.0 - 18.0 g/dL 9.3(L) 9.2(L) 9.1(L)  Hematocrit 40.0 - 52.0 % 27.4(L) 27.2(L) 26.6(L)  Platelets 150 - 440 K/uL - 282 269    BMET BMP Latest Ref Rng & Units 12/01/2017 11/23/2017 11/22/2017  Glucose 65 - 99 mg/dL 96 222(H) 130(H)  BUN 6 - 20 mg/dL 22(H) 48(H) 63(H)  Creatinine 0.61 - 1.24 mg/dL 1.55(H) 1.33(H) 1.36(H)  Sodium 135 - 145 mmol/L 135 133(L) 138  Potassium 3.5 - 5.1 mmol/L 4.1 4.9 5.0  Chloride 101 - 111 mmol/L 106 107 110  CO2 22 - 32 mmol/L _0 Calcium 8.9 - 10.3 mg/dL 8.6(L) 8.1(L) 8.2(L)    LFT Hepatic Function Latest Ref Rng & Units 12/01/2017 11/21/2017 01/04/2016  Total Protein 6.5 - 8.1 g/dL 6.6 5.6(L) 6.9  Albumin 3.5 - 5.0 g/dL 3.5 3.2(L) 3.3(L)  AST 15 - 41 U/L _1 ALT 17 - 63 U/L 27 18 15(L)  Alk Phosphatase 38 - 126 U/L 29(L) 17(L) 25(L)  Total Bilirubin 0.3 - 1.2 mg/dL 0.8 0.6 1.6(H)     STUDIES: No results found.    Impression / Plan:   [place]  is a 72 y.o. Caucasian male with metabolic syndrome, atrial fibrillation on pradaxa with chronic B12 deficiency and iron deficiency anemia and ongoing melena, source not identified on extensive endoscopic evaluation including EGD, colonoscopy, small bowel enteroscopy, video capsule endoscopy. Patient's hemoglobin is stable. His iron studies are consistent with iron deficiency anemia  - Monitor CBC for now - Parenteral iron therapy as inpatient and follow-up with hepatology as outpatient  - Intramuscular B-12 daily - Recommend to discuss with cardiology about alternative anticoagulation for A. Fib due to obscure GI bleed - Given extensive GI evaluation to date, we will defer further endoscopy procedures at this time as patient's hemoglobin has been stable - Dr. Alice Reichert to cover from tomorrow  Thank you for involving me in the care of this patient.      LOS: 0 days   Sherri Sear, MD  12/01/2017, 7:25 PM   Note: This dictation was prepared with Dragon dictation along with smaller phrase technology. Any transcriptional errors that result from this process are unintentional.

## 2017-12-01 NOTE — ED Triage Notes (Signed)
Arrives from [facility] for ED evaluation of low blood pressure (96/44) and drop in HGB/ HCT.  Patient recently an inpatient at [facility] and received 4 units PRBC.  H/H  Has dropped 4 points, according to [facility] staff.  Patient is AAOx3.  Skin warm and dry. NAD.

## 2017-12-01 NOTE — ED Notes (Signed)
Pt states that he was sent over by his PCP because his hemoglobin and his blood pressure were low. Pt states that he has been very weak today and was not able to get dressed on his own because he was so weak.  Pt reports having dizziness when changing positions.

## 2017-12-02 DIAGNOSIS — D62 Acute posthemorrhagic anemia: Secondary | ICD-10-CM | POA: Diagnosis not present

## 2017-12-02 LAB — BASIC METABOLIC PANEL
ANION GAP: 8 (ref 5–15)
BUN: 21 mg/dL — ABNORMAL HIGH (ref 6–20)
CHLORIDE: 107 mmol/L (ref 101–111)
CO2: 22 mmol/L (ref 22–32)
Calcium: 8.1 mg/dL — ABNORMAL LOW (ref 8.9–10.3)
Creatinine, Ser: 1.48 mg/dL — ABNORMAL HIGH (ref 0.61–1.24)
GFR calc non Af Amer: 45 mL/min — ABNORMAL LOW (ref >60)
GFR, EST AFRICAN AMERICAN: 52 mL/min — AB (ref >60)
Glucose, Bld: 135 mg/dL — ABNORMAL HIGH (ref 65–99)
Potassium: 4 mmol/L (ref 3.5–5.1)
Sodium: 137 mmol/L (ref 135–145)

## 2017-12-02 LAB — GLUCOSE, CAPILLARY
GLUCOSE-CAPILLARY: 128 mg/dL — AB (ref 65–99)
GLUCOSE-CAPILLARY: 87 mg/dL (ref 65–99)
Glucose-Capillary: 91 mg/dL (ref 65–99)

## 2017-12-02 LAB — CBC
HCT: 24.9 % — ABNORMAL LOW (ref 40.0–52.0)
HEMOGLOBIN: 8.6 g/dL — AB (ref 13.0–18.0)
MCH: 31.7 pg (ref 26.0–34.0)
MCHC: 34.5 g/dL (ref 32.0–36.0)
MCV: 92.1 fL (ref 80.0–100.0)
Platelets: 266 10*3/uL (ref 150–440)
RBC: 2.7 MIL/uL — AB (ref 4.40–5.90)
RDW: 17.2 % — ABNORMAL HIGH (ref 11.5–14.5)
WBC: 5.4 10*3/uL (ref 3.8–10.6)

## 2017-12-02 LAB — TROPONIN I

## 2017-12-02 MED ORDER — PANTOPRAZOLE SODIUM 40 MG PO TBEC: 40.0000 mg | DELAYED_RELEASE_TABLET | Freq: Every day | ORAL | 0 refills | Status: DC

## 2017-12-02 MED ORDER — PANTOPRAZOLE SODIUM 40 MG PO TBEC: 40.0000 mg | DELAYED_RELEASE_TABLET | Freq: Two times a day (BID) | ORAL | Status: DC

## 2017-12-02 MED ORDER — LISINOPRIL 10 MG PO TABS: 10.0000 mg | ORAL_TABLET | Freq: Every day | ORAL | 0 refills | Status: DC

## 2017-12-02 NOTE — Care Management Note (Signed)
Case Management Note  Patient Details  Name: Jay Morris MRN: [id number] Date of Birth: June 23, 1946  Subjective/Objective:   Admitted to [facility] under observation status with the diagnosis of hypotension.ives with wife, Jay Morris [phone]). Seen Dr. Doy Hutching yesterday. Prescriptions are filled at [facility]. No home health. No skilled facility. Home oxygen 2017, no longer on home oxygen. No medical equipment in he home. Takes care of all basic activities of daily living himself, drives. No falls, Good appetite. Family will transport.                 Action/Plan: No follow-up needs identified at this time.    Expected Discharge Date:                  Expected Discharge Plan:     In-House Referral:     Discharge planning Services     Post Acute Care Choice:    Choice offered to:     DME Arranged:    DME Agency:     HH Arranged:    HH Agency:     Status of Service:     If discussed at [employer], dates discussed:    Additional Comments:  Shelbie Ammons, RN MSN CCM Care Management [phone] 12/02/2017, 1:08 PM

## 2017-12-02 NOTE — Progress Notes (Signed)
Pt discharged today home with wife, no complaints VSS, and stable Hg, educated on changes in medications and new prescriptions, pt will call Monday to make follow up appointment with cardiology.

## 2017-12-02 NOTE — Consult Note (Signed)
Reason for Consult: Atrial fibrillation GI bleed hypertension Referring Physician: Dr. Jerelyn Jaidyn hospitalist Dr. Doy Hutching primary Dr. Nehemiah Massed cardiologist  Jay Morris is an 72 y.o. male.  HPI: Patient 72 year old male recent recurrent lower GI bleeding patient has history of atrial fibrillation on Pradaxa patient is recently underwent EGD and colonoscopy as well as swallowing the pill but no obvious source for bleeding has been identified.  Patient had a history of COPD and asthma but recently complains of lightheadedness dizziness with relative hypotension.  Denies any falls no abdominal pain has been compliant with his medications.  Patient is much improved and now may be ready to go home and follow-up as an outpatient  Past Medical History:  Diagnosis Date  . Asthma   . Atrial fibrillation ([place])   . BPH (benign prostatic hyperplasia)   . COPD (chronic obstructive pulmonary disease) ([place])   . Diabetes mellitus without complication ([place])   . Helicobacter pylori gastritis   . Hyperlipidemia   . Hypertension   . Pneumonia     Past Surgical History:  Procedure Laterality Date  . CARDIAC ELECTROPHYSIOLOGY MAPPING AND ABLATION    . COLONOSCOPY WITH PROPOFOL N/A 06/15/2017   Procedure: COLONOSCOPY WITH PROPOFOL;  Surgeon: Toledo, Benay Pike, MD;  Location: [facility] ENDOSCOPY;  Service: Gastroenterology;  Laterality: N/A;  . ENTEROSCOPY N/A 11/24/2017   Procedure: ENTEROSCOPY;  Surgeon: Jonathon Bellows, MD;  Location: [facility] ENDOSCOPY;  Service: Gastroenterology;  Laterality: N/A;  . ESOPHAGOGASTRODUODENOSCOPY N/A 11/27/2017   Procedure: ESOPHAGOGASTRODUODENOSCOPY (EGD);  Surgeon: Lucilla Lame, MD;  Location: [facility] ENDOSCOPY;  Service: Endoscopy;  Laterality: N/A;  . ESOPHAGOGASTRODUODENOSCOPY (EGD) WITH PROPOFOL N/A 06/02/2017   Procedure: ESOPHAGOGASTRODUODENOSCOPY (EGD) WITH PROPOFOL;  Surgeon: Toledo, Benay Pike, MD;  Location: [facility] ENDOSCOPY;  Service: Gastroenterology;  Laterality: N/A;  . GIVENS  CAPSULE STUDY N/A 11/25/2017   Procedure: GIVENS CAPSULE STUDY;  Surgeon: Jonathon Bellows, MD;  Location: [facility] ENDOSCOPY;  Service: Gastroenterology;  Laterality: N/A;  . HAND SURGERY      Family History  Problem Relation Age of Onset  . Heart disease Mother   . Cancer Father     Social History:  reports that he has quit smoking. His smoking use included cigarettes. He has a 30.00 pack-year smoking history. He has never used smokeless tobacco. He reports that he does not drink alcohol or use drugs.  Allergies:  Allergies  Allergen Reactions  . Shrimp [Shellfish Allergy] Anaphylaxis    Medications: I have reviewed the patient's current medications.  Results for orders placed or performed during the hospital encounter of 12/01/17 (from the past 48 hour(s))  Type and screen [place]     Status: None   Collection Time: 12/01/17 11:40 AM  Result Value Ref Range   ABO/RH(D) A POS    Antibody Screen NEG    Sample Expiration      12/04/2017 Performed at [facility], [address] [place],  [address]   Comprehensive metabolic panel     Status: Abnormal   Collection Time: 12/01/17 11:46 AM  Result Value Ref Range   Sodium 135 135 - 145 mmol/L   Potassium 4.1 3.5 - 5.1 mmol/L   Chloride 106 101 - 111 mmol/L   CO2 22 22 - 32 mmol/L   Glucose, Bld 96 65 - 99 mg/dL   BUN 22 (H) 6 - 20 mg/dL   Creatinine, Ser 1.55 (H) 0.61 - 1.24 mg/dL   Calcium 8.6 (L) 8.9 - 10.3 mg/dL   Total Protein 6.6  6.5 - 8.1 g/dL   Albumin 3.5 3.5 - 5.0 g/dL   AST 23 15 - 41 U/L   ALT 27 17 - 63 U/L   Alkaline Phosphatase 29 (L) 38 - 126 U/L   Total Bilirubin 0.8 0.3 - 1.2 mg/dL   GFR calc non Af Amer 42 (L) >60 mL/min   GFR calc Af Amer 49 (L) >60 mL/min    Comment: (NOTE) The eGFR has been calculated using the CKD EPI equation. This calculation has not been validated in all clinical situations. eGFR's persistently <60 mL/min signify possible Chronic  Kidney Disease.    Anion gap 7 5 - 15    Comment: Performed at [facility], [address] [place], [place] [address]  CBC     Status: Abnormal   Collection Time: 12/01/17 11:46 AM  Result Value Ref Range   WBC 6.3 3.8 - 10.6 K/uL   RBC 2.95 (L) 4.40 - 5.90 MIL/uL   Hemoglobin 9.2 (L) 13.0 - 18.0 g/dL   HCT 27.2 (L) 40.0 - 52.0 %   MCV 92.1 80.0 - 100.0 fL   MCH 31.3 26.0 - 34.0 pg   MCHC 33.9 32.0 - 36.0 g/dL   RDW 16.8 (H) 11.5 - 14.5 %   Platelets 282 150 - 440 K/uL    Comment: Performed at [facility], [address] [place], [place] [address]  Iron and TIBC     Status: Abnormal   Collection Time: 12/01/17 11:46 AM  Result Value Ref Range   Iron 20 (L) 45 - 182 ug/dL   TIBC 407 250 - 450 ug/dL   Saturation Ratios 5 (L) 17.9 - 39.5 %   UIBC 387 ug/dL    Comment: Performed at [facility], [address] [place], [place] [address]  Ferritin (Iron Binding Protein)     Status: None   Collection Time: 12/01/17 11:46 AM  Result Value Ref Range   Ferritin 29 24 - 336 ng/mL    Comment: Performed at [facility], [address] [place], [place] [address]  Vitamin B12     Status: Abnormal   Collection Time: 12/01/17  3:15 PM  Result Value Ref Range   Vitamin B-12 1,096 (H) 180 - 914 pg/mL    Comment: (NOTE) This assay is not validated for testing neonatal or myeloproliferative syndrome specimens for Vitamin B12 levels. Performed at [facility], [place] [address] [place], [place] [address]   Glucose, capillary     Status: None   Collection Time: 12/01/17  4:58 PM  Result Value Ref Range   Glucose-Capillary 91 65 - 99 mg/dL   Comment 1 Notify RN   Hemoglobin and hematocrit, blood     Status: Abnormal   Collection Time: 12/01/17  6:42 PM  Result Value Ref Range   Hemoglobin 9.3 (L) 13.0 - 18.0 g/dL   HCT 27.4 (L) 40.0 - 52.0 %    Comment: Performed at [facility], [address] [place], [place] [address]  TSH      Status: None   Collection Time: 12/01/17  6:42 PM  Result Value Ref Range   TSH 1.083 0.350 - 4.500 uIU/mL    Comment: Performed by a 3rd Generation assay with a functional sensitivity of <=0.01 uIU/mL. Performed at [facility], [address], [place], [place] [address]   Troponin I     Status: None   Collection Time: 12/01/17  6:42 PM  Result Value Ref  Range   Troponin I <0.03 <0.03 ng/mL    Comment: Performed at [facility], [address] [place], [place] [address]  Glucose, capillary     Status: Abnormal   Collection Time: 12/01/17 10:14 PM  Result Value Ref Range   Glucose-Capillary 134 (H) 65 - 99 mg/dL   Comment 1 Notify RN    Comment 2 Document in Chart   Hemoglobin and hematocrit, blood     Status: Abnormal   Collection Time: 12/01/17 10:27 PM  Result Value Ref Range   Hemoglobin 8.9 (L) 13.0 - 18.0 g/dL   HCT 26.2 (L) 40.0 - 52.0 %    Comment: Performed at [facility], [address] [place], [place] [address]  Troponin I     Status: None   Collection Time: 12/01/17 10:27 PM  Result Value Ref Range   Troponin I <0.03 <0.03 ng/mL    Comment: Performed at [facility], [address] [place], [place] [address]  Troponin I     Status: None   Collection Time: 12/02/17  1:10 AM  Result Value Ref Range   Troponin I <0.03 <0.03 ng/mL    Comment: Performed at [facility], [address] [place], [place] [address]  CBC     Status: Abnormal   Collection Time: 12/02/17  3:43 AM  Result Value Ref Range   WBC 5.4 3.8 - 10.6 K/uL   RBC 2.70 (L) 4.40 - 5.90 MIL/uL   Hemoglobin 8.6 (L) 13.0 - 18.0 g/dL   HCT 24.9 (L) 40.0 - 52.0 %   MCV 92.1 80.0 - 100.0 fL   MCH 31.7 26.0 - 34.0 pg   MCHC 34.5 32.0 - 36.0 g/dL   RDW 17.2 (H) 11.5 - 14.5 %   Platelets 266 150 - 440 K/uL    Comment: Performed at [facility], [address] [place], [place] [address]  Basic metabolic panel     Status: Abnormal    Collection Time: 12/02/17  3:43 AM  Result Value Ref Range   Sodium 137 135 - 145 mmol/L   Potassium 4.0 3.5 - 5.1 mmol/L   Chloride 107 101 - 111 mmol/L   CO2 22 22 - 32 mmol/L   Glucose, Bld 135 (H) 65 - 99 mg/dL   BUN 21 (H) 6 - 20 mg/dL   Creatinine, Ser 1.48 (H) 0.61 - 1.24 mg/dL   Calcium 8.1 (L) 8.9 - 10.3 mg/dL   GFR calc non Af Amer 45 (L) >60 mL/min   GFR calc Af Amer 52 (L) >60 mL/min    Comment: (NOTE) The eGFR has been calculated using the CKD EPI equation. This calculation has not been validated in all clinical situations. eGFR's persistently <60 mL/min signify possible Chronic Kidney Disease.    Anion gap 8 5 - 15    Comment: Performed at [facility], [address] [place], [place] [address]  Troponin I     Status: None   Collection Time: 12/02/17  3:43 AM  Result Value Ref Range   Troponin I <0.03 <0.03 ng/mL    Comment: Performed at [facility], [address] [place], [place] [address]  Glucose, capillary     Status: Abnormal   Collection Time: 12/02/17  7:37 AM  Result Value Ref Range   Glucose-Capillary 128 (H) 65 - 99 mg/dL   Comment 1 Notify RN   Glucose, capillary     Status: None   Collection Time: 12/02/17 12:19  PM  Result Value Ref Range   Glucose-Capillary 87 65 - 99 mg/dL   Comment 1 Notify RN     No results found.  Review of Systems  Constitutional: Positive for diaphoresis and malaise/fatigue.  HENT: Negative.   Eyes: Negative.   Respiratory: Negative.   Cardiovascular: Positive for palpitations.  Gastrointestinal: Positive for blood in stool and melena.  Genitourinary: Negative.   Musculoskeletal: Negative.   Skin: Negative.   Neurological: Positive for dizziness.  Endo/Heme/Allergies: Negative.   Psychiatric/Behavioral: Negative.    Blood pressure (!) 117/55, pulse 60, temperature 98.2 F (36.8 C), temperature source Oral, resp. rate 16, height _0  (1.727 m), weight 186 lb 4.8 oz (84.5 kg), SpO2 92  %. Physical Exam  Constitutional: He is oriented to person, place, and time. He appears well-developed and well-nourished.  HENT:  Head: Normocephalic and atraumatic.  Eyes: Pupils are equal, round, and reactive to light. Conjunctivae and EOM are normal.  Neck: Normal range of motion. Neck supple.  Cardiovascular: Normal rate and normal pulses. An irregularly irregular rhythm present.  Murmur heard. Respiratory: Effort normal and breath sounds normal.  GI: Soft. Bowel sounds are normal.  Musculoskeletal: Normal range of motion.  Neurological: He is alert and oriented to person, place, and time. He has normal reflexes.  Skin: Skin is warm and dry.  Psychiatric: He has a normal mood and affect.    Assessment/Plan: Atrial fibrillation controlled rate Lower GI bleeding Relative hypotension Anemia COPD Hypertension Diabetes COPD Mild obesity . Plan Patient appears to be stable enough for discharge follow-up with cardiology Continue Pradaxa for anticoagulation Agree with Coreg for rate control Inhalers for COPD symptoms Continue diabetes management and control Follow-up with Dr. Nehemiah Massed 2 to 3 weeks  Dwayne D Callwood 12/02/2017, 4:33 PM

## 2017-12-02 NOTE — Discharge Summary (Signed)
Jay Morris, is a 72 y.o. male  DOB [birth year]  MRN [id number].  Admission date:  11/21/2017  Admitting Physician  Henreitta Leber, MD  Discharge Date:  11/29/2017   Primary MD  [name], MD  Recommendations for primary care physician for things to follow:   Follow with PCP in 1 week Follow-up with gastroenterology in 10 days follow-up with patient's primary cardiologist at [facility] in 1 week to 2 weeks.   Admission Diagnosis  Hypotension, unspecified hypotension type [I95.9] Gastrointestinal hemorrhage, unspecified gastrointestinal hemorrhage type [K92.2]   Discharge Diagnosis  Hypotension, unspecified hypotension type [I95.9] Gastrointestinal hemorrhage, unspecified gastrointestinal hemorrhage type [K92.2]    Active Problems:   GI bleed   Iron deficiency anemia due to chronic blood loss      Past Medical History:  Diagnosis Date  . Asthma   . Atrial fibrillation ([place])   . BPH (benign prostatic hyperplasia)   . COPD (chronic obstructive pulmonary disease) ([place])   . Diabetes mellitus without complication ([place])   . Helicobacter pylori gastritis   . Hyperlipidemia   . Hypertension   . Pneumonia     Past Surgical History:  Procedure Laterality Date  . CARDIAC ELECTROPHYSIOLOGY MAPPING AND ABLATION    . COLONOSCOPY WITH PROPOFOL N/A 06/15/2017   Procedure: COLONOSCOPY WITH PROPOFOL;  Surgeon: Toledo, Benay Pike, MD;  Location: [facility] ENDOSCOPY;  Service: Gastroenterology;  Laterality: N/A;  . ENTEROSCOPY N/A 11/24/2017   Procedure: ENTEROSCOPY;  Surgeon: Jonathon Bellows, MD;  Location[facility] ENDOSCOPY;  Service: Gastroenterology;  Laterality: N/A;  . ESOPHAGOGASTRODUODENOSCOPY N/A 11/27/2017   Procedure: ESOPHAGOGASTRODUODENOSCOPY (EGD);  Surgeon: Lucilla Lame, MD;  Location: [facility] ENDOSCOPY;  Service: Endoscopy;   Laterality: N/A;  . ESOPHAGOGASTRODUODENOSCOPY (EGD) WITH PROPOFOL N/A 06/02/2017   Procedure: ESOPHAGOGASTRODUODENOSCOPY (EGD) WITH PROPOFOL;  Surgeon: Toledo, Benay Pike, MD;  Location: [facility] ENDOSCOPY;  Service: Gastroenterology;  Laterality: N/A;  . GIVENS CAPSULE STUDY N/A 11/25/2017   Procedure: GIVENS CAPSULE STUDY;  Surgeon: Jonathon Bellows, MD;  Location: [facility] ENDOSCOPY;  Service: Gastroenterology;  Laterality: N/A;  . HAND SURGERY         History of present illness and  Hospital Course:     Kindly see H&P for history of present illness and admission details, please review complete Labs, Consult reports and Test reports for all details in brief  HPI  from the history and physical done on the day of admission 72 year old male patient with history of atrial fibrillation, hypertension, hyperlipidemia, history of COPD came in because of dizziness, fatigue, melena.  Patient admitted for possible GI bleed.  Hemoglobin on admission 7.5, previous hemoglobin in January 2019 was 12.  Hospital Course  #1 GI bleed suspected upper GI bleed secondary to melena, started on Protonix drip, discontinue Pradaxa that he was taking for atrial fibrillation, admitted to medical unit, obtain GI consult. 2.  Acute blood loss anemia secondary to GI bleed, patient received 2 units of packed RBC, seen by gastroenterology, stop Pradaxa, patient had EGD, colonoscopy, capsule enteroscopy all of the studies have been negative.  Patient discharged home, advised to start Pradaxa and follow-up with his PCP, primary cardiologist.  Discharge hemoglobin 9.3 on 16 May.  Hemoglobin results conveyed to patient and his wife. 3.  Diabetes mellitus type 2: Patient received sliding scale insulin coverage, initially metformin was on hold secondary to GI bleed, n.p.o. status.  After procedures EGD, colonoscopy, capsule enteroscopy patient started back on regular diet, started on Metformin, mealtime coverage insulin at discharge. 4.  Chronic atrial fibrillation, rate controlled with Coreg, patient was hypotensive during hospitalization at one point so we did not give Coreg but BP and heart rate improved so Coreg restarted at discharge, Pradaxa also restarted discharge according to GI recommendation. 5.  BPH: Continue Flomax 6.  COPD: No wheezing, continue Breo Ellipta 7.  History of gout but no attack this time.  Patient can continue allopurinol, 8.  Hyperlipidemia: Continue statins.  Next At the time of discharge stable and patient eager to go home so discharge the patient home  B12 deficiency anemia, started on B12  1071mcrograms p.o. daily. Discharge Condition: stable   Follow UP  Follow-up Information    SIdelle Crouch MD. Schedule an appointment as soon as possible for a visit in 3 days.   Specialty:  Internal Medicine Why:  Office will call pt at home  Contact information: [place] [phone]       AJonathon Bellows MD. Go on 12/29/2017.   Specialty:  Gastroenterology Why:  _0 :15 PM Contact information: [phone]        KCorey Skains MD. Go on 12/15/2017.   Specialty:  Cardiology Why:  _1 :30 AM Contact information: [address] [facility] [phone]            Discharge Instructions  and  Discharge Medications      Allergies as of 11/29/2017      Reactions   Shrimp [shellfish Allergy] Anaphylaxis      Medication List    TAKE these medications   allopurinol 100 MG tablet Commonly known as:  ZYLOPRIM Take 100 mg by mouth daily.   atorvastatin 20 MG tablet Commonly known as:  LIPITOR Take 20 mg by mouth at bedtime.   BREO ELLIPTA 100-25 MCG/INH Aepb Generic drug:  fluticasone furoate-vilanterol Inhale 1 puff into the lungs daily.   carvedilol 6.25 MG tablet Commonly known as:  COREG Take 12.5 mg by  mouth 2 (two) times daily.   furosemide 20 MG tablet Commonly known as:  LASIX Take 20 mg by mouth daily.   indomethacin 50 MG capsule Commonly known as:  INDOCIN Take 50 mg by mouth 3 (three) times daily as needed (gout).   insulin aspart 100 UNIT/ML injection Commonly known as:  novoLOG Inject 15-25 Units into the skin 3 (three) times daily before meals. Inject 25 units before breakfast, 15 units before lunch and 25 units before dinner.   lisinopril 40 MG tablet Commonly known as:  PRINIVIL,ZESTRIL Take 40 mg by mouth at bedtime.   metFORMIN 1000 MG tablet Commonly known as:  GLUCOPHAGE Take 1,000 mg by mouth 2 (two) times daily.   omega-3 acid ethyl esters 1 g capsule Commonly known as:  LOVAZA Take 4 g by mouth daily.   PRADAXA 150 MG Caps capsule Generic drug:  dabigatran Take 150 mg by mouth 2 (two) times daily.   spironolactone 25 MG tablet Commonly known as:  ALDACTONE Take 25 mg daily by mouth.   tamsulosin 0.4 MG Caps capsule Commonly known as:  FLOMAX Take 0.4 mg by mouth every evening.   TRESIBA FLEXTOUCH 100 UNIT/ML Sopn FlexTouch Pen Generic drug:  insulin degludec Inject 100 Units into the skin daily at 10 pm.   vitamin B-12 1000 MCG tablet Commonly known as:  CYANOCOBALAMIN Take 1 tablet (1,000 mcg total) by mouth daily.  Diet and Activity recommendation: See Discharge Instructions above   Consults obtained -gastroenterology   Major procedures and Radiology Reports - PLEASE review detailed and final reports for all details, in brief -      Dg Abd 1 View  Result Date: 11/25/2017 CLINICAL DATA:  Evaluate for endoscopic capsule. EXAM: ABDOMEN - 1 VIEW COMPARISON:  Abdominal CT 11/21/2017 FINDINGS: Endoscopic capsule is in the right lower abdomen and appears to be in the cecum. Gas within the small and large bowel. Oral contrast in the colon related to the CT from 11/21/2017. Degenerative disc and endplate changes in the lower lumbar  spine. IMPRESSION: Endoscopic capsule is in the right lower quadrant and probably in the cecum. Nonobstructive bowel gas pattern. Electronically Signed   By: Markus Daft M.D.   On: 11/25/2017 13:30   Ct Abdomen Pelvis W Contrast  Result Date: 11/21/2017 CLINICAL DATA:  Dizziness and left lower quadrant pain EXAM: CT ABDOMEN AND PELVIS WITH CONTRAST TECHNIQUE: Multidetector CT imaging of the abdomen and pelvis was performed using the standard protocol following bolus administration of intravenous contrast. CONTRAST:  43m ISOVUE-370 COMPARISON:  None. FINDINGS: Lower chest: Lung bases demonstrate some minimal scarring in the lingula. No focal infiltrate or effusion is seen. Hepatobiliary: No focal liver abnormality is seen. No gallstones, gallbladder wall thickening, or biliary dilatation. Pancreas: Unremarkable. No pancreatic ductal dilatation or surrounding inflammatory changes. Spleen: Normal in size without focal abnormality. Adrenals/Urinary Tract: Adrenal glands are unremarkable. Kidneys are normal, without renal calculi, focal lesion, or hydronephrosis. Bladder is unremarkable. Stomach/Bowel: Stomach is within normal limits. Appendix appears normal. No evidence of bowel wall thickening, distention, or inflammatory changes. Vascular/Lymphatic: Aortic atherosclerosis. No enlarged abdominal or pelvic lymph nodes. Reproductive: Prostate is unremarkable. Other: No abdominal wall hernia or abnormality. No abdominopelvic ascites. Musculoskeletal: Degenerative changes of lumbar spine are noted. IMPRESSION: No acute abnormality noted. Electronically Signed   By: MInez CatalinaM.D.   On: 11/21/2017 13:16    Micro Results     No results found for this or any previous visit (from the past 240 hour(s)).     Today   Subjective:   CKara Meadnot have further melena, stable hemoglobin, tolerating the diet, eager to go home. Objective:   Blood pressure (!) 121/49, pulse 60, temperature 98.1 F (36.7  C), temperature source Oral, resp. rate 20, height _0  (1.727 m), weight 90 kg (198 lb 8 oz), SpO2 100 %.  No intake or output data in the 24 hours ending 12/02/17 0713  Exam Awake Alert, Oriented x 3, No new F.N deficits, Normal affect [place].AT,PERRAL Supple Neck,No JVD, No cervical lymphadenopathy appriciated.  Symmetrical Chest wall movement, Good air movement bilaterally, CTAB RRR,No Gallops,Rubs or new Murmurs, No Parasternal Heave +ve B.Sounds, Abd Soft, Non tender, No organomegaly appriciated, No rebound -guarding or rigidity. No Cyanosis, Clubbing or edema, No new Rash or bruise  Data Review   CBC w Diff:  Lab Results  Component Value Date   WBC 5.4 12/02/2017   HGB 8.6 (L) 12/02/2017   HCT 24.9 (L) 12/02/2017   PLT 266 12/02/2017    CMP:  Lab Results  Component Value Date   NA 137 12/02/2017   K 4.0 12/02/2017   CL 107 12/02/2017   CO2 22 12/02/2017   BUN 21 (H) 12/02/2017   CREATININE 1.48 (H) 12/02/2017   PROT 6.6 12/01/2017   ALBUMIN 3.5 12/01/2017   BILITOT 0.8 12/01/2017   ALKPHOS 29 (L) 12/01/2017   AST 23  12/01/2017   ALT 27 12/01/2017  . , As the plan and the results with patient, patient's wife and they acknowledged understanding and need for follow-up with PCP, cardiologist.  Patient told me that he is taking Pradaxa for a long time.  Total Time in preparing paper work, data evaluation and todays exam - 35 minutes  Epifanio Lesches M.D on 12/02/2017 at 7:13 AM    Note: This dictation was prepared with Dragon dictation along with smaller phrase technology. Any transcriptional errors that result from this process are unintentional.

## 2017-12-02 NOTE — Care Management Obs Status (Signed)
[place] NOTIFICATION   Patient Details  Name: Menashe Kafer MRN: [id number] Date of Birth: August 08, 1945   Medicare Observation Status Notification Given:  Yes    Shelbie Ammons, RN 12/02/2017, 12:51 PM

## 2017-12-15 DIAGNOSIS — Z9189 Other specified personal risk factors, not elsewhere classified: Secondary | ICD-10-CM | POA: Insufficient documentation

## 2017-12-15 NOTE — Discharge Summary (Signed)
[facility] [place] NAME: Jay Morris    MR#:  [id number]  DATE OF BIRTH:  10-28-1945  DATE OF ADMISSION:  12/01/2017 ADMITTING PHYSICIAN: Gorden Harms, MD  DATE OF DISCHARGE: 12/02/2017  5:24 PM  PRIMARY CARE PHYSICIAN: [name], MD    ADMISSION DIAGNOSIS:  Symptomatic anemia [D64.9] Gastrointestinal hemorrhage, unspecified gastrointestinal hemorrhage type [K92.2]  DISCHARGE DIAGNOSIS:  Active Problems:   Hypotension   Anemia, GI bleed  SECONDARY DIAGNOSIS:   Past Medical History:  Diagnosis Date  . Asthma   . Atrial fibrillation ([place])   . BPH (benign prostatic hyperplasia)   . COPD (chronic obstructive pulmonary disease) ([place])   . Diabetes mellitus without complication ([place])   . Helicobacter pylori gastritis   . Hyperlipidemia   . Hypertension   . Pneumonia     HOSPITAL COURSE:   * Acute symptomatic bradycardia with hypotension Referred to the observation unit Most likely secondary to AV nodal blocking agents for A. fib stable orthostatics now and in the morning, hold AV nodal blocking agents/antihypertensives agents, rule out acute current syndrome with cardiac enzymes x3 sets, cardiology had seen and suggested to cont meds. BP stable. *Acute blood loss anemia Continues to complain of dark stools Stable hemoglobin compared to last check Noted recent hospital admission for GI bleeding/melena s/p transfusion for which EGD/colonoscopy/small bowel endoscopy were normal per Dr. Vanga/gastroenterology's last notes Protonix drip for now, strict I&O monitoring, gastroenterology to see, IV iron x1, type and screen, H&H every 6 hours, CBC daily, transfuse as needed Seen by GI, suggested no further work ups.   Possible iron deficency anemia,  *Chronic paroxysmal A. Fib Stable Continue Pradaxa, hold AV nodal blocking agents as stated above given  HR stable and stop coreg.  *COPD without  exacerbation Stable Breathing treatments as needed  *Chronic diabetes mellitus type 2 Stable Sliding scale insulin with Accu-Cheks per routine, reduce home dose of long-acting and mealtime insulin, hold metformin  *Chronic benign essential hypertension Currently hypotensive Continue to hold antihypertensives for now, PRN hydralazine for systolic blood pressure greater than 160, vitals per routine, make changes as per necessary   DISCHARGE CONDITIONS:   stable.  CONSULTS OBTAINED:  Treatment Team:  Lin Landsman, MD Yolonda Kida, MD  DRUG ALLERGIES:   Allergies  Allergen Reactions  . Shrimp [Shellfish Allergy] Anaphylaxis    DISCHARGE MEDICATIONS:   Allergies as of 12/02/2017      Reactions   Shrimp [shellfish Allergy] Anaphylaxis      Medication List    STOP taking these medications   carvedilol 6.25 MG tablet Commonly known as:  COREG   spironolactone 25 MG tablet Commonly known as:  ALDACTONE     TAKE these medications   allopurinol 100 MG tablet Commonly known as:  ZYLOPRIM Take 100 mg by mouth daily.   atorvastatin 20 MG tablet Commonly known as:  LIPITOR Take 20 mg by mouth at bedtime.   BREO ELLIPTA 100-25 MCG/INH Aepb Generic drug:  fluticasone furoate-vilanterol Inhale 1 puff into the lungs daily.   furosemide 20 MG tablet Commonly known as:  LASIX Take 20 mg by mouth daily.   indomethacin 50 MG capsule Commonly known as:  INDOCIN Take 50 mg by mouth 3 (three) times daily as needed (gout).   insulin aspart 100 UNIT/ML injection Commonly known as:  novoLOG Inject 15-25 Units into the skin 3 (three) times daily before meals. Inject 25 units before breakfast, 15  units before lunch and 25 units before dinner.   lisinopril 10 MG tablet Commonly known as:  PRINIVIL,ZESTRIL Take 1 tablet (10 mg total) by mouth at bedtime. What changed:    medication strength  how much to take   metFORMIN 1000 MG tablet Commonly known as:   GLUCOPHAGE Take 1,000 mg by mouth 2 (two) times daily.   omega-3 acid ethyl esters 1 g capsule Commonly known as:  LOVAZA Take 4 g by mouth daily.   pantoprazole 40 MG tablet Commonly known as:  PROTONIX Take 1 tablet (40 mg total) by mouth daily.   PRADAXA 150 MG Caps capsule Generic drug:  dabigatran Take 150 mg by mouth 2 (two) times daily.   tamsulosin 0.4 MG Caps capsule Commonly known as:  FLOMAX Take 0.4 mg by mouth every evening.   TRESIBA FLEXTOUCH 100 UNIT/ML Sopn FlexTouch Pen Generic drug:  insulin degludec Inject 100 Units into the skin daily at 10 pm.   vitamin B-12 1000 MCG tablet Commonly known as:  CYANOCOBALAMIN Take 1 tablet (1,000 mcg total) by mouth daily.        DISCHARGE INSTRUCTIONS:    Follow with Cardio and GI clinic.  If you experience worsening of your admission symptoms, develop shortness of breath, life threatening emergency, suicidal or homicidal thoughts you must seek medical attention immediately by calling 911 or calling your MD immediately  if symptoms less severe.  You Must read complete instructions/literature along with all the possible adverse reactions/side effects for all the Medicines you take and that have been prescribed to you. Take any new Medicines after you have completely understood and accept all the possible adverse reactions/side effects.   Please note  You were cared for by a hospitalist during your hospital stay. If you have any questions about your discharge medications or the care you received while you were in the hospital after you are discharged, you can call the unit and asked to speak with the hospitalist on call if the hospitalist that took care of you is not available. Once you are discharged, your primary care physician will handle any further medical issues. Please note that NO REFILLS for any discharge medications will be authorized once you are discharged, as it is imperative that you return to your primary  care physician (or establish a relationship with a primary care physician if you do not have one) for your aftercare needs so that they can reassess your need for medications and monitor your lab values.    Today   CHIEF COMPLAINT:   Chief Complaint  Patient presents with  . Hypotension  . Anemia    HISTORY OF PRESENT ILLNESS:  Jay Morris  is a 72 y.o. male with a known history of recently discharged from the hospital for acute GI bleeding status post transfusion, had EGD/colonoscopy/small bowel endoscopy which were all normal, returns to the ER from the [place] clinic for low blood pressure, per ED attending whom discussed case with the patient's primary care provider/Dr. Sparks-recommended admission overnight for observation with IV iron/also discussed with gastroenterology who recommended IV iron with consultation, in the emergency room systolic blood pressure was in the 90s, heart rate between 47-53, hemoglobin 9.2 which is stable from last check, creatinine 1.5 at baseline, EKG noted for A. fib with heart rate of 50, on evaluation in the emergency room patient is going in between sinus rhythm and A. fib on monitor with persistent heart rate of 48-50, patient without complaint, wife at the bedside,  patient continues to complain of dark stools, patient is now been admitted for acute symptomatic bradycardia, and acute on chronic melena.   VITAL SIGNS:  Blood pressure (!) 117/55, pulse 60, temperature 98.2 F (36.8 C), temperature source Oral, resp. rate 16, height _0  (1.727 m), weight 84.5 kg (186 lb 4.8 oz), SpO2 92 %.  I/O:  No intake or output data in the 24 hours ending 12/15/17 2214  PHYSICAL EXAMINATION:  GENERAL:  72 y.o.-year-old patient lying in the bed with no acute distress.  EYES: Pupils equal, round, reactive to light and accommodation. No scleral icterus. Extraocular muscles intact.  HEENT: Head atraumatic, normocephalic. Oropharynx and nasopharynx clear.  NECK:   Supple, no jugular venous distention. No thyroid enlargement, no tenderness.  LUNGS: Normal breath sounds bilaterally, no wheezing, rales,rhonchi or crepitation. No use of accessory muscles of respiration.  CARDIOVASCULAR: S1, S2 normal. No murmurs, rubs, or gallops.  ABDOMEN: Soft, non-tender, non-distended. Bowel sounds present. No organomegaly or mass.  EXTREMITIES: No pedal edema, cyanosis, or clubbing.  NEUROLOGIC: Cranial nerves II through XII are intact. Muscle strength 5/5 in all extremities. Sensation intact. Gait not checked.  PSYCHIATRIC: The patient is alert and oriented x 3.  SKIN: No obvious rash, lesion, or ulcer.   DATA REVIEW:   CBC No results for input(s): WBC, HGB, HCT, PLT in the last 168 hours.  Chemistries  No results for input(s): NA, K, CL, CO2, GLUCOSE, BUN, CREATININE, CALCIUM, MG, AST, ALT, ALKPHOS, BILITOT in the last 168 hours.  Invalid input(s): GFRCGP  Cardiac Enzymes No results for input(s): TROPONINI in the last 168 hours.  Microbiology Results  Results for orders placed or performed during the hospital encounter of 01/04/16  Blood culture (routine x 2)     Status: None   Collection Time: 01/04/16  4:28 PM  Result Value Ref Range Status   Specimen Description BLOOD LEFT HAND  Final   Special Requests BOTTLES DRAWN AEROBIC AND ANAEROBIC  5CC  Final   Culture NO GROWTH 5 DAYS  Final   Report Status 01/09/2016 FINAL  Final  Blood culture (routine x 2)     Status: None   Collection Time: 01/04/16  4:33 PM  Result Value Ref Range Status   Specimen Description BLOOD RIGHT ANTECUBITAL  Final   Special Requests BOTTLES DRAWN AEROBIC AND ANAEROBIC  5CC  Final   Culture NO GROWTH 5 DAYS  Final   Report Status 01/09/2016 FINAL  Final  Urine culture     Status: Abnormal   Collection Time: 01/05/16  4:08 AM  Result Value Ref Range Status   Specimen Description URINE, RANDOM  Final   Special Requests NONE  Final   Culture (A)  Final    1,000 COLONIES/mL  INSIGNIFICANT GROWTH Performed at [facility]    Report Status 01/06/2016 FINAL  Final    RADIOLOGY:  No results found.  EKG:   Orders placed or performed during the hospital encounter of 12/01/17  . EKG 12-Lead  . EKG 12-Lead      Management plans discussed with the patient, family and they are in agreement.  CODE STATUS:  Code Status History    Date Active Date Inactive Code Status Order ID Comments User Context   12/01/2017 1649 12/02/2017 2029 Full Code [id number]  Gorden Harms, MD ED   11/21/2017 1526 11/29/2017 1800 Full Code [id number]  Henreitta Leber, MD Inpatient   01/04/2016 2005 01/09/2016 1908 Full Code [id number]  Henreitta Leber,  MD Inpatient      TOTAL TIME TAKING CARE OF THIS PATIENT: 35 minutes.    Vaughan Basta M.D on 12/15/2017 at 10:14 PM  Between 7am to 6pm - Pager - [phone]  After 6pm go to [email or web address] - password [email or web address] [facility]  Office  [phone]  CC: Primary care physician; [name], MD   Note: This dictation was prepared with Dragon dictation along with smaller phrase technology. Any transcriptional errors that result from this process are unintentional.

## 2017-12-19 ENCOUNTER — Encounter: Payer: Self-pay | Admitting: Gastroenterology

## 2017-12-29 ENCOUNTER — Encounter: Payer: Self-pay | Admitting: Gastroenterology

## 2017-12-29 ENCOUNTER — Ambulatory Visit (INDEPENDENT_AMBULATORY_CARE_PROVIDER_SITE_OTHER): Payer: Medicare Other | Admitting: Gastroenterology

## 2017-12-29 ENCOUNTER — Telehealth: Payer: Self-pay | Admitting: Gastroenterology

## 2017-12-29 ENCOUNTER — Other Ambulatory Visit
Admission: RE | Admit: 2017-12-29 | Discharge: 2017-12-29 | Disposition: A | Discharge status: Home / Self Care | Payer: Medicare Other | Source: Ambulatory Visit | Attending: Gastroenterology | Admitting: Gastroenterology

## 2017-12-29 ENCOUNTER — Encounter (INDEPENDENT_AMBULATORY_CARE_PROVIDER_SITE_OTHER): Payer: Self-pay

## 2017-12-29 VITALS — BP 120/63 | HR 70 | Ht 68.0 in | Wt 192.4 lb

## 2017-12-29 DIAGNOSIS — K921 Melena: Secondary | ICD-10-CM | POA: Insufficient documentation

## 2017-12-29 DIAGNOSIS — E538 Deficiency of other specified B group vitamins: Secondary | ICD-10-CM | POA: Insufficient documentation

## 2017-12-29 DIAGNOSIS — K219 Gastro-esophageal reflux disease without esophagitis: Secondary | ICD-10-CM | POA: Diagnosis not present

## 2017-12-29 LAB — CBC WITH DIFFERENTIAL/PLATELET
BASOS ABS: 0.1 10*3/uL (ref 0–0.1)
BASOS PCT: 1 %
Eosinophils Absolute: 0.1 10*3/uL (ref 0–0.7)
Eosinophils Relative: 1 %
HEMATOCRIT: 33.5 % — AB (ref 40.0–52.0)
HEMOGLOBIN: 11.3 g/dL — AB (ref 13.0–18.0)
Lymphocytes Relative: 21 %
Lymphs Abs: 1.6 10*3/uL (ref 1.0–3.6)
MCH: 31.8 pg (ref 26.0–34.0)
MCHC: 33.6 g/dL (ref 32.0–36.0)
MCV: 94.7 fL (ref 80.0–100.0)
MONO ABS: 0.5 10*3/uL (ref 0.2–1.0)
Monocytes Relative: 6 %
NEUTROS ABS: 5.6 10*3/uL (ref 1.4–6.5)
NEUTROS PCT: 71 %
Platelets: 229 10*3/uL (ref 150–440)
RBC: 3.54 MIL/uL — AB (ref 4.40–5.90)
RDW: 19.3 % — AB (ref 11.5–14.5)
WBC: 7.8 10*3/uL (ref 3.8–10.6)

## 2017-12-29 LAB — IRON AND TIBC
Iron: 39 ug/dL — ABNORMAL LOW (ref 45–182)
SATURATION RATIOS: 11 % — AB (ref 17.9–39.5)
TIBC: 363 ug/dL (ref 250–450)
UIBC: 324 ug/dL

## 2017-12-29 LAB — FERRITIN: FERRITIN: 86 ng/mL (ref 24–336)

## 2017-12-29 LAB — PROTIME-INR
INR: 1.31
PROTHROMBIN TIME: 16.2 s — AB (ref 11.4–15.2)

## 2017-12-29 MED ORDER — OMEPRAZOLE 40 MG PO CPDR
40.0000 mg | DELAYED_RELEASE_CAPSULE | Freq: Every day | ORAL | 3 refills | Status: DC
Start: 2017-12-29 — End: 2019-06-25

## 2017-12-29 NOTE — Progress Notes (Signed)
Per Mr. Dietrick, during the day he's fine. At bedtime heartburn occurs and abdominal pain is experienced.

## 2017-12-29 NOTE — Progress Notes (Signed)
Jay Bellows MD, MRCP(U.K) [address]  [place]  [place], [place] [address]  Main: [phone]  Fax: [phone]   Primary Care Physician: [name], MD  Primary Gastroenterologist:  Dr. Jonathon Morris    Chief Complaint  Patient presents with  . Anemia  . GI Bleeding    HPI: Kahlin Mark is a 72 y.o. male    He is here today for a hospital follow up    Summary of history :  He is previously a patient of Dr Alice Reichert seen at [facility]. He was seen by him in November 2018 when he complained of black tarry stools for about a month.  He had been on Pradaxa for atrial fibrillation.  He underwent an upper endoscopy November 2018 by Dr. Alice Reichert and biopsies were taken from the gastric antrum demonstrated possible gastritis.  He did not take any biopsies from the duodenum for celiac disease.  At the same time a colonoscopy was also performed which was essentially normal random colon biopsies were also taken which were negative for lymphocytic colitis.  No plans were made for more bowel capsule study.  Looking back at his labs he had a normocytic anemia in November 2018.  Borderline low ferritin  He was admitted on 11/21/17 for anemia with symptoms of diziness . A drop from 12 grams and INR 3.67 , tarry black stools. He was on Pradaxa.   I performed a push enteroscopy on 11/24/17 which was normal . A capsule study was performed on 11/27/17 which was normal.   He had iron and b12 deficiency . Discharged an readmitted on 12/01/17 for further anemia while on Pradaxa. . Labs 12/02/17 : hb 8.6 grams.   Interval history   5//2019-  12/29/2017    On pradaxa, stools are not black , doing well no complaints.  Says at time has acid reflux. Not on PPI.   Current Outpatient Medications  Medication Sig Dispense Refill  . allopurinol (ZYLOPRIM) 100 MG tablet Take 100 mg by mouth daily.    Marland Kitchen atorvastatin (LIPITOR) 20 MG tablet Take 20 mg by mouth at bedtime.    Marland Kitchen BREO ELLIPTA 100-25 MCG/INH  AEPB Inhale 1 puff into the lungs daily.    . colchicine (COLCRYS) 0.6 MG tablet Colcrys 0.6 mg tablet    . fluticasone furoate-vilanterol (BREO ELLIPTA) 100-25 MCG/INH AEPB Breo Ellipta 100 mcg-25 mcg/dose powder for inhalation    . furosemide (LASIX) 20 MG tablet Take 20 mg by mouth daily.    Marland Kitchen HYDROcodone-acetaminophen (NORCO/VICODIN) 5-325 MG tablet     . indomethacin (INDOCIN) 50 MG capsule Take 50 mg by mouth 3 (three) times daily as needed (gout).    . insulin aspart (NOVOLOG) 100 UNIT/ML injection Inject 15-25 Units into the skin 3 (three) times daily before meals. Inject 25 units before breakfast, 15 units before lunch and 25 units before dinner.    . insulin degludec (TRESIBA FLEXTOUCH) 100 UNIT/ML SOPN FlexTouch Pen Inject 100 Units into the skin daily at 10 pm.     . Insulin Pen Needle 31G X 8 MM MISC BD Ultra-Fine Short Pen Needle 31 gauge x 5/16"    . lisinopril (PRINIVIL,ZESTRIL) 10 MG tablet Take 1 tablet (10 mg total) by mouth at bedtime. 30 tablet 0  . metFORMIN (GLUCOPHAGE) 1000 MG tablet Take 1,000 mg by mouth 2 (two) times daily.    . Omega-3 Fatty Acids (OMEGA 3 PO) Omega 3    . pantoprazole (PROTONIX) 40 MG tablet Take  1 tablet (40 mg total) by mouth daily. 30 tablet 0  . PRADAXA 150 MG CAPS capsule Take 150 mg by mouth 2 (two) times daily.    . tamsulosin (FLOMAX) 0.4 MG CAPS capsule Take 0.4 mg by mouth every evening.     . vitamin B-12 (CYANOCOBALAMIN) 1000 MCG tablet Take 1 tablet (1,000 mcg total) by mouth daily. 30 tablet 0  . carvedilol (COREG) 6.25 MG tablet TAKE TWO TABLETS TWICE DAILY WITH MEALS    . insulin glargine (LANTUS) 100 UNIT/ML injection Lantus U-100 Insulin 100 unit/mL subcutaneous solution    . levofloxacin (LEVAQUIN) 750 MG tablet levofloxacin 750 mg tablet    . methocarbamol (ROBAXIN) 500 MG tablet methocarbamol 500 mg tablet    . metoprolol tartrate (LOPRESSOR) 25 MG tablet metoprolol tartrate 25 mg tablet    . omega-3 acid ethyl esters (LOVAZA) 1  g capsule Take 4 g by mouth daily.    . predniSONE (DELTASONE) 10 MG tablet prednisone 10 mg tablet    . spironolactone (ALDACTONE) 25 MG tablet TAKE ONE TABLET BY MOUTH EVERY DAY    . traMADol (ULTRAM) 50 MG tablet tramadol 50 mg tablet     No current facility-administered medications for this visit.     Allergies as of 12/29/2017 - Review Complete 12/29/2017  Allergen Reaction Noted  . Shrimp [shellfish allergy] Anaphylaxis 11/21/2017    ROS:  General: Negative for anorexia, weight loss, fever, chills, fatigue, weakness. ENT: Negative for hoarseness, difficulty swallowing , nasal congestion. CV: Negative for chest pain, angina, palpitations, dyspnea on exertion, peripheral edema.  Respiratory: Negative for dyspnea at rest, dyspnea on exertion, cough, sputum, wheezing.  GI: See history of present illness. GU:  Negative for dysuria, hematuria, urinary incontinence, urinary frequency, nocturnal urination.  Endo: Negative for unusual weight change.    Physical Examination:   BP 120/63   Pulse 70   Ht _0  (1.727 m)   Wt 192 lb 6.4 oz (87.3 kg)   BMI 29.25 kg/m   General: Well-nourished, well-developed in no acute distress.  Eyes: No icterus. Conjunctivae pink. Mouth: Oropharyngeal mucosa moist and pink , no lesions erythema or exudate. Lungs: Clear to auscultation bilaterally. Non-labored. Heart: Regular rate and rhythm, no murmurs rubs or gallops.  Abdomen: Bowel sounds are normal, nontender, nondistended, no hepatosplenomegaly or masses, no abdominal bruits or hernia , no rebound or guarding.   Extremities: No lower extremity edema. No clubbing or deformities. Neuro: Alert and oriented x 3.  Grossly intact. Skin: Warm and dry, no jaundice.   Psych: Alert and cooperative, normal mood and affect.   Imaging Studies: No results found.  Assessment and Plan:   Josephine Rudnick is a 72 y.o. y/o male here to see me for a hospital follow up for overt obscure GI bleed (normal  Push enteroscopy, colonoscopy,capsule study), noted to have iron and b12 deficiency.  Presently doing well on Pradaxa. Some features of GERD  Plan  1. Check CBC,b12,iron,folate levels- if low needs IV iron  2. GERD- PPI 40 mg prilosec daily 3. Counseled on life style changes for GERD, patient information provided   Dr Jay Bellows  MD,MRCP [facility]) Follow up in 6 weeks

## 2017-12-29 NOTE — Telephone Encounter (Signed)
I called patient to see if he wanted to continue with [place] or keep his appt with [place]. I explained it wasn't good patient care to have 2 GI dr for the same reason. Patient stated he wanted to stay with Dr. Vicente Males and [facility]

## 2017-12-30 LAB — VITAMIN B12: Vitamin B-12: 680 pg/mL (ref 180–914)

## 2018-01-01 ENCOUNTER — Encounter: Payer: Self-pay | Admitting: Gastroenterology

## 2018-01-06 ENCOUNTER — Telehealth: Payer: Self-pay | Admitting: Gastroenterology

## 2018-01-06 DIAGNOSIS — E538 Deficiency of other specified B group vitamins: Secondary | ICD-10-CM

## 2018-01-06 NOTE — Telephone Encounter (Signed)
PT WIFE CALLED TO CHECK ON PT BLOOD WORK

## 2018-01-10 NOTE — Telephone Encounter (Signed)
Advised patient's spouse per Dr. Georgeann Oppenheim note:  Iron levels and Hb have improved really weel. Check CBC in 4-6 weeks   Ordered CBC. Labs to be completed on 7/25 or  7/26 prior to 7/29 appointment.

## 2018-02-08 DIAGNOSIS — E119 Type 2 diabetes mellitus without complications: Secondary | ICD-10-CM | POA: Insufficient documentation

## 2018-02-08 DIAGNOSIS — E1129 Type 2 diabetes mellitus with other diabetic kidney complication: Secondary | ICD-10-CM | POA: Insufficient documentation

## 2018-02-09 ENCOUNTER — Other Ambulatory Visit
Admission: RE | Admit: 2018-02-09 | Discharge: 2018-02-09 | Disposition: A | Discharge status: Home / Self Care | Payer: Medicare Other | Source: Ambulatory Visit | Attending: Gastroenterology | Admitting: Gastroenterology

## 2018-02-09 DIAGNOSIS — E538 Deficiency of other specified B group vitamins: Secondary | ICD-10-CM | POA: Diagnosis present

## 2018-02-09 LAB — CBC WITH DIFFERENTIAL/PLATELET
Basophils Absolute: 0.1 10*3/uL (ref 0–0.1)
Basophils Relative: 1 %
EOS PCT: 1 %
Eosinophils Absolute: 0.1 10*3/uL (ref 0–0.7)
HEMATOCRIT: 36.5 % — AB (ref 40.0–52.0)
Hemoglobin: 12.5 g/dL — ABNORMAL LOW (ref 13.0–18.0)
LYMPHS ABS: 1.9 10*3/uL (ref 1.0–3.6)
Lymphocytes Relative: 25 %
MCH: 31.6 pg (ref 26.0–34.0)
MCHC: 34.3 g/dL (ref 32.0–36.0)
MCV: 92 fL (ref 80.0–100.0)
MONO ABS: 0.7 10*3/uL (ref 0.2–1.0)
Monocytes Relative: 9 %
Neutro Abs: 4.8 10*3/uL (ref 1.4–6.5)
Neutrophils Relative %: 64 %
Platelets: 216 10*3/uL (ref 150–440)
RBC: 3.96 MIL/uL — ABNORMAL LOW (ref 4.40–5.90)
RDW: 16.3 % — ABNORMAL HIGH (ref 11.5–14.5)
WBC: 7.7 10*3/uL (ref 3.8–10.6)

## 2018-02-13 ENCOUNTER — Ambulatory Visit (INDEPENDENT_AMBULATORY_CARE_PROVIDER_SITE_OTHER): Payer: Medicare Other | Admitting: Gastroenterology

## 2018-02-13 ENCOUNTER — Other Ambulatory Visit: Payer: Self-pay

## 2018-02-13 ENCOUNTER — Encounter (INDEPENDENT_AMBULATORY_CARE_PROVIDER_SITE_OTHER): Payer: Self-pay

## 2018-02-13 DIAGNOSIS — D509 Iron deficiency anemia, unspecified: Secondary | ICD-10-CM

## 2018-02-13 NOTE — Progress Notes (Signed)
Jonathon Bellows MD, MRCP(U.K) [address]  [place]  [place], [place] [address]  Main: [phone]  Fax: [phone]   Primary Care Physician: [name], MD  Primary Gastroenterologist:  Dr. Jonathon Bellows   No chief complaint on file.   HPI: Jay Morris is a 72 y.o. male   Summary of history :  He is previously a patient of Dr Alice Reichert seen at [facility]. He was seen by him in November 2018 when he complained of black tarry stools for about a month. He had been on Pradaxa for atrial fibrillation. He underwent an upper endoscopy November 2018 by Dr. Alice Reichert and biopsies were taken from the gastric antrum demonstrated possible gastritis. He did not take any biopsies from the duodenum for celiac disease. At the same time a colonoscopy was also performed which was essentially normal random colon biopsies were also taken which were negative for lymphocytic colitis. No plans were made for more bowel capsule study. Looking back at his labs he had a normocytic anemia in November 2018. Borderline low ferritin  He was admitted on 11/21/17 for anemia with symptoms of diziness . A drop from 12 grams and INR 3.67 , tarry black stools. He was on Pradaxa.   I performed a push enteroscopy on 11/24/17 which was normal . A capsule study was performed on 11/27/17 which was normal.   He had iron and b12 deficiency . Discharged an readmitted on 12/01/17 for further anemia while on Pradaxa. . Labs 12/02/17 : hb 8.6 grams.   Interval history  12/29/2017-02/13/18   CBC Latest Ref Rng & Units 02/09/2018 12/29/2017 12/02/2017  WBC 3.8 - 10.6 K/uL 7.7 7.8 5.4  Hemoglobin 13.0 - 18.0 g/dL 12.5(L) 11.3(L) 8.6(L)  Hematocrit 40.0 - 52.0 % 36.5(L) 33.5(L) 24.9(L)  Platelets 150 - 440 K/uL 216 229 266   Iron/TIBC/Ferritin/ %Sat    Component Value Date/Time   IRON 39 (L) 12/29/2017 1607   TIBC 363 12/29/2017 1607   FERRITIN 86 12/29/2017 1607   IRONPCTSAT 11 (L) 12/29/2017 1607   Normal B12.    On  pradaxa. Not on any iron. Doing well. Normal stool. No blood in stool and brown in color. No abdominal pain or heartburn. On prilosec. Denies use of any NSAID's.    Current Outpatient Medications  Medication Sig Dispense Refill  . allopurinol (ZYLOPRIM) 100 MG tablet Take 100 mg by mouth daily.    Marland Kitchen atorvastatin (LIPITOR) 20 MG tablet Take 20 mg by mouth at bedtime.    Marland Kitchen BREO ELLIPTA 100-25 MCG/INH AEPB Inhale 1 puff into the lungs daily.    . colchicine (COLCRYS) 0.6 MG tablet Colcrys 0.6 mg tablet    . fluticasone furoate-vilanterol (BREO ELLIPTA) 100-25 MCG/INH AEPB Breo Ellipta 100 mcg-25 mcg/dose powder for inhalation    . furosemide (LASIX) 20 MG tablet Take 20 mg by mouth daily.    Marland Kitchen HYDROcodone-acetaminophen (NORCO/VICODIN) 5-325 MG tablet     . indomethacin (INDOCIN) 50 MG capsule Take 50 mg by mouth 3 (three) times daily as needed (gout).    . insulin aspart (NOVOLOG) 100 UNIT/ML injection Inject 15-25 Units into the skin 3 (three) times daily before meals. Inject 25 units before breakfast, 15 units before lunch and 25 units before dinner.    . insulin degludec (TRESIBA FLEXTOUCH) 100 UNIT/ML SOPN FlexTouch Pen Inject 100 Units into the skin daily at 10 pm.     . insulin glargine (LANTUS) 100 UNIT/ML injection Lantus U-100 Insulin 100 unit/mL subcutaneous solution    .  Insulin Pen Needle 31G X 8 MM MISC BD Ultra-Fine Short Pen Needle 31 gauge x 5/16"    . levofloxacin (LEVAQUIN) 750 MG tablet levofloxacin 750 mg tablet    . lisinopril (PRINIVIL,ZESTRIL) 10 MG tablet Take 1 tablet (10 mg total) by mouth at bedtime. 30 tablet 0  . metFORMIN (GLUCOPHAGE) 1000 MG tablet Take 1,000 mg by mouth 2 (two) times daily.    . methocarbamol (ROBAXIN) 500 MG tablet methocarbamol 500 mg tablet    . metoprolol tartrate (LOPRESSOR) 25 MG tablet metoprolol tartrate 25 mg tablet    . omega-3 acid ethyl esters (LOVAZA) 1 g capsule Take 4 g by mouth daily.    . Omega-3 Fatty Acids (OMEGA 3 PO) Omega 3      . omeprazole (PRILOSEC) 40 MG capsule Take 1 capsule (40 mg total) by mouth daily. 90 capsule 3  . pantoprazole (PROTONIX) 40 MG tablet Take 1 tablet (40 mg total) by mouth daily. 30 tablet 0  . PRADAXA 150 MG CAPS capsule Take 150 mg by mouth 2 (two) times daily.    . predniSONE (DELTASONE) 10 MG tablet prednisone 10 mg tablet    . spironolactone (ALDACTONE) 25 MG tablet TAKE ONE TABLET BY MOUTH EVERY DAY    . tamsulosin (FLOMAX) 0.4 MG CAPS capsule Take 0.4 mg by mouth every evening.     . traMADol (ULTRAM) 50 MG tablet tramadol 50 mg tablet    . vitamin B-12 (CYANOCOBALAMIN) 1000 MCG tablet Take 1 tablet (1,000 mcg total) by mouth daily. 30 tablet 0   No current facility-administered medications for this visit.     Allergies as of 02/13/2018 - Review Complete 12/29/2017  Allergen Reaction Noted  . Shrimp [shellfish allergy] Anaphylaxis 11/21/2017    ROS:  General: Negative for anorexia, weight loss, fever, chills, fatigue, weakness. ENT: Negative for hoarseness, difficulty swallowing , nasal congestion. CV: Negative for chest pain, angina, palpitations, dyspnea on exertion, peripheral edema.  Respiratory: Negative for dyspnea at rest, dyspnea on exertion, cough, sputum, wheezing.  GI: See history of present illness. GU:  Negative for dysuria, hematuria, urinary incontinence, urinary frequency, nocturnal urination.  Endo: Negative for unusual weight change.    Physical Examination:   There were no vitals taken for this visit.  General: Well-nourished, well-developed in no acute distress.  Eyes: No icterus. Conjunctivae pink. Mouth: Oropharyngeal mucosa moist and pink , no lesions erythema or exudate. Lungs: Clear to auscultation bilaterally. Non-labored. Heart: Regular rate and rhythm, no murmurs rubs or gallops.  Abdomen: Bowel sounds are normal, nontender, nondistended, no hepatosplenomegaly or masses, no abdominal bruits or hernia , no rebound or guarding.   Extremities:  No lower extremity edema. No clubbing or deformities. Neuro: Alert and oriented x 3.  Grossly intact. Skin: Warm and dry, no jaundice.   Psych: Alert and cooperative, normal mood and affect.   Imaging Studies: No results found.  Assessment and Plan:   Jay Morris is a 72 y.o. y/o male  here to see me for a hospital follow up for overt obscure GI bleed (normal Push enteroscopy, colonoscopy,capsule study), noted to have iron and b12 deficiency.  Presently doing well on Pradaxa. Some features of GERD. Hb improved. Suggest to continue on PPI as long as he stays on Pradaxa. Check CBC in 3 months to ensure if stable and follow up as needed    Dr Jonathon Bellows  MD,MRCP [facility]) Follow up PRN

## 2018-02-20 ENCOUNTER — Encounter: Payer: Self-pay | Admitting: Gastroenterology

## 2018-05-08 ENCOUNTER — Other Ambulatory Visit
Admission: RE | Admit: 2018-05-08 | Discharge: 2018-05-08 | Disposition: A | Discharge status: Home / Self Care | Payer: Medicare Other | Source: Ambulatory Visit | Attending: Gastroenterology | Admitting: Gastroenterology

## 2018-05-08 DIAGNOSIS — D509 Iron deficiency anemia, unspecified: Secondary | ICD-10-CM | POA: Diagnosis present

## 2018-05-08 DIAGNOSIS — E538 Deficiency of other specified B group vitamins: Secondary | ICD-10-CM | POA: Insufficient documentation

## 2018-05-08 LAB — CBC WITH DIFFERENTIAL/PLATELET
Abs Immature Granulocytes: 0.02 10*3/uL (ref 0.00–0.07)
BASOS ABS: 0.1 10*3/uL (ref 0.0–0.1)
BASOS PCT: 1 %
EOS ABS: 0.2 10*3/uL (ref 0.0–0.5)
Eosinophils Relative: 2 %
HCT: 38.3 % — ABNORMAL LOW (ref 39.0–52.0)
Hemoglobin: 12.3 g/dL — ABNORMAL LOW (ref 13.0–17.0)
IMMATURE GRANULOCYTES: 0 %
LYMPHS PCT: 29 %
Lymphs Abs: 2.2 10*3/uL (ref 0.7–4.0)
MCH: 30.4 pg (ref 26.0–34.0)
MCHC: 32.1 g/dL (ref 30.0–36.0)
MCV: 94.6 fL (ref 80.0–100.0)
Monocytes Absolute: 0.8 10*3/uL (ref 0.1–1.0)
Monocytes Relative: 10 %
NEUTROS PCT: 58 %
NRBC: 0 % (ref 0.0–0.2)
Neutro Abs: 4.3 10*3/uL (ref 1.7–7.7)
PLATELETS: 223 10*3/uL (ref 150–400)
RBC: 4.05 MIL/uL — AB (ref 4.22–5.81)
RDW: 14.5 % (ref 11.5–15.5)
WBC: 7.6 10*3/uL (ref 4.0–10.5)

## 2018-05-14 ENCOUNTER — Encounter: Payer: Self-pay | Admitting: Gastroenterology

## 2019-05-30 DIAGNOSIS — Z8616 Personal history of COVID-19: Secondary | ICD-10-CM

## 2019-05-30 HISTORY — DX: Personal history of COVID-19: Z86.16

## 2019-05-31 ENCOUNTER — Other Ambulatory Visit: Payer: Self-pay

## 2019-05-31 ENCOUNTER — Inpatient Hospital Stay
Admission: EM | Admit: 2019-05-31 | Discharge: 2019-06-02 | DRG: 177 | Disposition: A | Discharge status: Other Acute Inpatient Hospital | Payer: Medicare HMO | Attending: Internal Medicine | Admitting: Internal Medicine

## 2019-05-31 ENCOUNTER — Inpatient Hospital Stay
Admit: 2019-05-31 | Discharge: 2019-05-31 | Disposition: A | Discharge status: Home / Self Care | Payer: Medicare HMO | Attending: Family Medicine | Admitting: Family Medicine

## 2019-05-31 ENCOUNTER — Emergency Department: Payer: Medicare HMO

## 2019-05-31 ENCOUNTER — Inpatient Hospital Stay: Payer: Medicare HMO

## 2019-05-31 DIAGNOSIS — E785 Hyperlipidemia, unspecified: Secondary | ICD-10-CM | POA: Diagnosis present

## 2019-05-31 DIAGNOSIS — R0902 Hypoxemia: Secondary | ICD-10-CM | POA: Diagnosis not present

## 2019-05-31 DIAGNOSIS — U071 COVID-19: Principal | ICD-10-CM | POA: Diagnosis present

## 2019-05-31 DIAGNOSIS — M6281 Muscle weakness (generalized): Secondary | ICD-10-CM | POA: Diagnosis not present

## 2019-05-31 DIAGNOSIS — N189 Chronic kidney disease, unspecified: Secondary | ICD-10-CM

## 2019-05-31 DIAGNOSIS — Z794 Long term (current) use of insulin: Secondary | ICD-10-CM

## 2019-05-31 DIAGNOSIS — A4189 Other specified sepsis: Secondary | ICD-10-CM | POA: Diagnosis present

## 2019-05-31 DIAGNOSIS — I5031 Acute diastolic (congestive) heart failure: Secondary | ICD-10-CM | POA: Diagnosis present

## 2019-05-31 DIAGNOSIS — E1122 Type 2 diabetes mellitus with diabetic chronic kidney disease: Secondary | ICD-10-CM | POA: Diagnosis present

## 2019-05-31 DIAGNOSIS — R0600 Dyspnea, unspecified: Secondary | ICD-10-CM

## 2019-05-31 DIAGNOSIS — N1831 Chronic kidney disease, stage 3a: Secondary | ICD-10-CM | POA: Diagnosis present

## 2019-05-31 DIAGNOSIS — J441 Chronic obstructive pulmonary disease with (acute) exacerbation: Secondary | ICD-10-CM | POA: Diagnosis present

## 2019-05-31 DIAGNOSIS — N179 Acute kidney failure, unspecified: Secondary | ICD-10-CM | POA: Diagnosis present

## 2019-05-31 DIAGNOSIS — R0602 Shortness of breath: Secondary | ICD-10-CM

## 2019-05-31 DIAGNOSIS — I130 Hypertensive heart and chronic kidney disease with heart failure and stage 1 through stage 4 chronic kidney disease, or unspecified chronic kidney disease: Secondary | ICD-10-CM | POA: Diagnosis present

## 2019-05-31 DIAGNOSIS — I480 Paroxysmal atrial fibrillation: Secondary | ICD-10-CM | POA: Diagnosis present

## 2019-05-31 DIAGNOSIS — J9601 Acute respiratory failure with hypoxia: Secondary | ICD-10-CM | POA: Diagnosis present

## 2019-05-31 DIAGNOSIS — D631 Anemia in chronic kidney disease: Secondary | ICD-10-CM | POA: Diagnosis present

## 2019-05-31 DIAGNOSIS — Z8249 Family history of ischemic heart disease and other diseases of the circulatory system: Secondary | ICD-10-CM

## 2019-05-31 DIAGNOSIS — Z7952 Long term (current) use of systemic steroids: Secondary | ICD-10-CM | POA: Diagnosis not present

## 2019-05-31 DIAGNOSIS — M109 Gout, unspecified: Secondary | ICD-10-CM | POA: Diagnosis present

## 2019-05-31 DIAGNOSIS — Z91013 Allergy to seafood: Secondary | ICD-10-CM | POA: Diagnosis not present

## 2019-05-31 DIAGNOSIS — Z79891 Long term (current) use of opiate analgesic: Secondary | ICD-10-CM

## 2019-05-31 DIAGNOSIS — Z79899 Other long term (current) drug therapy: Secondary | ICD-10-CM | POA: Diagnosis not present

## 2019-05-31 DIAGNOSIS — N400 Enlarged prostate without lower urinary tract symptoms: Secondary | ICD-10-CM | POA: Diagnosis present

## 2019-05-31 DIAGNOSIS — E1165 Type 2 diabetes mellitus with hyperglycemia: Secondary | ICD-10-CM

## 2019-05-31 DIAGNOSIS — K529 Noninfective gastroenteritis and colitis, unspecified: Secondary | ICD-10-CM | POA: Diagnosis not present

## 2019-05-31 DIAGNOSIS — R6520 Severe sepsis without septic shock: Secondary | ICD-10-CM | POA: Diagnosis present

## 2019-05-31 DIAGNOSIS — J449 Chronic obstructive pulmonary disease, unspecified: Secondary | ICD-10-CM | POA: Diagnosis present

## 2019-05-31 DIAGNOSIS — Z87891 Personal history of nicotine dependence: Secondary | ICD-10-CM

## 2019-05-31 LAB — ECHOCARDIOGRAM COMPLETE
Height: 71 in
Weight: 3200 oz

## 2019-05-31 LAB — BASIC METABOLIC PANEL
Anion gap: 11 (ref 5–15)
BUN: 37 mg/dL — ABNORMAL HIGH (ref 8–23)
CO2: 22 mmol/L (ref 22–32)
Calcium: 7.6 mg/dL — ABNORMAL LOW (ref 8.9–10.3)
Chloride: 98 mmol/L (ref 98–111)
Creatinine, Ser: 2.32 mg/dL — ABNORMAL HIGH (ref 0.61–1.24)
GFR calc Af Amer: 31 mL/min — ABNORMAL LOW (ref >60)
GFR calc non Af Amer: 26 mL/min — ABNORMAL LOW (ref >60)
Glucose, Bld: 97 mg/dL (ref 70–99)
Potassium: 4.2 mmol/L (ref 3.5–5.1)
Sodium: 131 mmol/L — ABNORMAL LOW (ref 135–145)

## 2019-05-31 LAB — HEPATIC FUNCTION PANEL
ALT: 26 U/L (ref 0–44)
AST: 39 U/L (ref 15–41)
Albumin: 3.8 g/dL (ref 3.5–5.0)
Alkaline Phosphatase: 30 U/L — ABNORMAL LOW (ref 38–126)
Bilirubin, Direct: 0.2 mg/dL (ref 0.0–0.2)
Indirect Bilirubin: 0.9 mg/dL (ref 0.3–0.9)
Total Bilirubin: 1.1 mg/dL (ref 0.3–1.2)
Total Protein: 7.3 g/dL (ref 6.5–8.1)

## 2019-05-31 LAB — HEMOGLOBIN A1C
Hgb A1c MFr Bld: 7.5 % — ABNORMAL HIGH (ref 4.8–5.6)
Mean Plasma Glucose: 168.55 mg/dL

## 2019-05-31 LAB — CBC WITH DIFFERENTIAL/PLATELET
Abs Immature Granulocytes: 0.03 10*3/uL (ref 0.00–0.07)
Basophils Absolute: 0 10*3/uL (ref 0.0–0.1)
Basophils Relative: 0 %
Eosinophils Absolute: 0 10*3/uL (ref 0.0–0.5)
Eosinophils Relative: 0 %
HCT: 37.1 % — ABNORMAL LOW (ref 39.0–52.0)
Hemoglobin: 12.5 g/dL — ABNORMAL LOW (ref 13.0–17.0)
Immature Granulocytes: 1 %
Lymphocytes Relative: 22 %
Lymphs Abs: 1.2 10*3/uL (ref 0.7–4.0)
MCH: 31.6 pg (ref 26.0–34.0)
MCHC: 33.7 g/dL (ref 30.0–36.0)
MCV: 93.9 fL (ref 80.0–100.0)
Monocytes Absolute: 0.5 10*3/uL (ref 0.1–1.0)
Monocytes Relative: 10 %
Neutro Abs: 3.5 10*3/uL (ref 1.7–7.7)
Neutrophils Relative %: 67 %
Platelets: 161 10*3/uL (ref 150–400)
RBC: 3.95 MIL/uL — ABNORMAL LOW (ref 4.22–5.81)
RDW: 13.6 % (ref 11.5–15.5)
WBC: 5.3 10*3/uL (ref 4.0–10.5)
nRBC: 0 % (ref 0.0–0.2)

## 2019-05-31 LAB — C-REACTIVE PROTEIN: CRP: 2.1 mg/dL — ABNORMAL HIGH (ref <1.0)

## 2019-05-31 LAB — PROCALCITONIN: Procalcitonin: <0.1 ng/mL

## 2019-05-31 LAB — TROPONIN I (HIGH SENSITIVITY)
Troponin I (High Sensitivity): 18 ng/L — ABNORMAL HIGH (ref <18)
Troponin I (High Sensitivity): 17 ng/L (ref <18)
Troponin I (High Sensitivity): 14 ng/L (ref <18)

## 2019-05-31 LAB — GLUCOSE, CAPILLARY
Glucose-Capillary: 121 mg/dL — ABNORMAL HIGH (ref 70–99)
Glucose-Capillary: 214 mg/dL — ABNORMAL HIGH (ref 70–99)
Glucose-Capillary: 384 mg/dL — ABNORMAL HIGH (ref 70–99)
Glucose-Capillary: 313 mg/dL — ABNORMAL HIGH (ref 70–99)

## 2019-05-31 LAB — LACTATE DEHYDROGENASE: LDH: 204 U/L — ABNORMAL HIGH (ref 98–192)

## 2019-05-31 LAB — FIBRIN DERIVATIVES D-DIMER (ARMC ONLY): Fibrin derivatives D-dimer (ARMC): 458.02 ng/mL (FEU) (ref 0.00–499.00)

## 2019-05-31 LAB — FERRITIN: Ferritin: 178 ng/mL (ref 24–336)

## 2019-05-31 LAB — SARS CORONAVIRUS 2 (TAT 6-24 HRS): SARS Coronavirus 2: NEGATIVE

## 2019-05-31 LAB — BRAIN NATRIURETIC PEPTIDE: B Natriuretic Peptide: 37 pg/mL (ref 0.0–100.0)

## 2019-05-31 MED ORDER — SODIUM CHLORIDE 0.9 % IV SOLN
2.0000 g | Freq: Once | INTRAVENOUS | Status: AC
  Administered 2019-05-31: 2 g via INTRAVENOUS
  Filled 2019-05-31: qty 20

## 2019-05-31 MED ORDER — ONDANSETRON HCL 4 MG PO TABS: 4.0000 mg | ORAL_TABLET | Freq: Four times a day (QID) | ORAL | Status: DC | PRN

## 2019-05-31 MED ORDER — TRAZODONE HCL 50 MG PO TABS
25.0000 mg | ORAL_TABLET | Freq: Every evening | ORAL | Status: DC | PRN
  Administered 2019-05-31: 25 mg via ORAL
  Filled 2019-05-31: qty 1

## 2019-05-31 MED ORDER — FUROSEMIDE 10 MG/ML IJ SOLN
INTRAMUSCULAR | Status: AC
  Filled 2019-05-31: qty 4

## 2019-05-31 MED ORDER — IPRATROPIUM-ALBUTEROL 0.5-2.5 (3) MG/3ML IN SOLN
3.0000 mL | Freq: Four times a day (QID) | RESPIRATORY_TRACT | Status: DC
  Administered 2019-05-31 – 2019-06-01 (×4): 3 mL via RESPIRATORY_TRACT
  Filled 2019-05-31 (×6): qty 3

## 2019-05-31 MED ORDER — ACETAMINOPHEN 325 MG PO TABS
650.0000 mg | ORAL_TABLET | Freq: Four times a day (QID) | ORAL | Status: DC | PRN
  Administered 2019-06-01: 650 mg via ORAL
  Filled 2019-05-31 (×2): qty 2

## 2019-05-31 MED ORDER — TRAMADOL HCL 50 MG PO TABS: 50.0000 mg | ORAL_TABLET | Freq: Four times a day (QID) | ORAL | Status: DC | PRN

## 2019-05-31 MED ORDER — OMEGA-3-ACID ETHYL ESTERS 1 G PO CAPS
4.0000 g | ORAL_CAPSULE | Freq: Every day | ORAL | Status: DC
  Administered 2019-05-31 – 2019-06-01 (×2): 4 g via ORAL
  Filled 2019-05-31 (×2): qty 4

## 2019-05-31 MED ORDER — DABIGATRAN ETEXILATE MESYLATE 150 MG PO CAPS
150.0000 mg | ORAL_CAPSULE | Freq: Two times a day (BID) | ORAL | Status: DC
  Administered 2019-05-31 – 2019-06-01 (×4): 150 mg via ORAL
  Filled 2019-05-31 (×5): qty 1

## 2019-05-31 MED ORDER — HYDROCODONE-ACETAMINOPHEN 5-325 MG PO TABS: 1.0000 | ORAL_TABLET | ORAL | Status: DC | PRN

## 2019-05-31 MED ORDER — PANTOPRAZOLE SODIUM 40 MG PO TBEC
40.0000 mg | DELAYED_RELEASE_TABLET | Freq: Every day | ORAL | Status: DC
  Administered 2019-05-31 – 2019-06-01 (×2): 40 mg via ORAL
  Filled 2019-05-31 (×2): qty 1

## 2019-05-31 MED ORDER — AZITHROMYCIN 250 MG PO TABS
500.0000 mg | ORAL_TABLET | Freq: Every day | ORAL | Status: DC
  Administered 2019-06-01: 500 mg via ORAL
  Filled 2019-05-31 (×2): qty 2

## 2019-05-31 MED ORDER — VITAMIN B-12 1000 MCG PO TABS
1000.0000 ug | ORAL_TABLET | Freq: Every day | ORAL | Status: DC
  Administered 2019-05-31 – 2019-06-01 (×2): 1000 ug via ORAL
  Filled 2019-05-31 (×2): qty 1

## 2019-05-31 MED ORDER — ENOXAPARIN SODIUM 40 MG/0.4ML ~~LOC~~ SOLN: 40.0000 mg | SUBCUTANEOUS | Status: DC

## 2019-05-31 MED ORDER — METOPROLOL TARTRATE 25 MG PO TABS
25.0000 mg | ORAL_TABLET | Freq: Two times a day (BID) | ORAL | Status: DC
  Administered 2019-05-31 – 2019-06-01 (×3): 25 mg via ORAL
  Filled 2019-05-31 (×4): qty 1

## 2019-05-31 MED ORDER — ALLOPURINOL 100 MG PO TABS
100.0000 mg | ORAL_TABLET | Freq: Every day | ORAL | Status: DC
  Administered 2019-05-31 – 2019-06-01 (×2): 100 mg via ORAL
  Filled 2019-05-31 (×3): qty 1

## 2019-05-31 MED ORDER — TAMSULOSIN HCL 0.4 MG PO CAPS
0.4000 mg | ORAL_CAPSULE | Freq: Every evening | ORAL | Status: DC
  Administered 2019-05-31: 0.4 mg via ORAL
  Filled 2019-05-31 (×2): qty 1

## 2019-05-31 MED ORDER — METHOCARBAMOL 500 MG PO TABS
500.0000 mg | ORAL_TABLET | Freq: Four times a day (QID) | ORAL | Status: DC | PRN
  Filled 2019-05-31: qty 1

## 2019-05-31 MED ORDER — INSULIN DEGLUDEC 100 UNIT/ML ~~LOC~~ SOPN: 100.0000 [IU] | PEN_INJECTOR | Freq: Every day | SUBCUTANEOUS | Status: DC

## 2019-05-31 MED ORDER — MAGNESIUM HYDROXIDE 400 MG/5ML PO SUSP: 30.0000 mL | Freq: Every day | ORAL | Status: DC | PRN

## 2019-05-31 MED ORDER — INSULIN ASPART 100 UNIT/ML ~~LOC~~ SOLN
0.0000 [IU] | Freq: Every day | SUBCUTANEOUS | Status: DC
  Administered 2019-05-31: 4 [IU] via SUBCUTANEOUS
  Filled 2019-05-31: qty 1

## 2019-05-31 MED ORDER — PANTOPRAZOLE SODIUM 40 MG PO TBEC: 40.0000 mg | DELAYED_RELEASE_TABLET | Freq: Every day | ORAL | Status: DC

## 2019-05-31 MED ORDER — SODIUM CHLORIDE 0.9 % IV SOLN: INTRAVENOUS | Status: DC

## 2019-05-31 MED ORDER — INSULIN GLARGINE 100 UNIT/ML ~~LOC~~ SOLN
60.0000 [IU] | Freq: Every day | SUBCUTANEOUS | Status: DC
  Administered 2019-05-31: 60 [IU] via SUBCUTANEOUS
  Filled 2019-05-31 (×2): qty 0.6

## 2019-05-31 MED ORDER — SODIUM CHLORIDE 0.9 % IV BOLUS
500.0000 mL | Freq: Once | INTRAVENOUS | Status: AC
  Administered 2019-05-31: 500 mL via INTRAVENOUS

## 2019-05-31 MED ORDER — SODIUM CHLORIDE 0.9 % IV SOLN
500.0000 mg | Freq: Once | INTRAVENOUS | Status: AC
  Administered 2019-05-31: 500 mg via INTRAVENOUS
  Filled 2019-05-31: qty 500

## 2019-05-31 MED ORDER — INSULIN ASPART 100 UNIT/ML ~~LOC~~ SOLN
0.0000 [IU] | Freq: Three times a day (TID) | SUBCUTANEOUS | Status: DC
  Administered 2019-05-31: 3 [IU] via SUBCUTANEOUS
  Administered 2019-05-31: 1 [IU] via SUBCUTANEOUS
  Filled 2019-05-31 (×2): qty 1

## 2019-05-31 MED ORDER — FUROSEMIDE 10 MG/ML IJ SOLN
20.0000 mg | Freq: Once | INTRAMUSCULAR | Status: AC
  Administered 2019-05-31: 20 mg via INTRAVENOUS

## 2019-05-31 MED ORDER — SODIUM CHLORIDE 0.9 % IV SOLN
INTRAVENOUS | Status: DC
  Administered 2019-05-31 (×2): via INTRAVENOUS

## 2019-05-31 MED ORDER — ATORVASTATIN CALCIUM 20 MG PO TABS
20.0000 mg | ORAL_TABLET | Freq: Every day | ORAL | Status: DC
  Administered 2019-05-31 – 2019-06-01 (×2): 20 mg via ORAL
  Filled 2019-05-31 (×2): qty 1

## 2019-05-31 MED ORDER — METHYLPREDNISOLONE SODIUM SUCC 125 MG IJ SOLR
125.0000 mg | Freq: Once | INTRAMUSCULAR | Status: AC
  Administered 2019-05-31: 125 mg via INTRAVENOUS
  Filled 2019-05-31: qty 2

## 2019-05-31 MED ORDER — ONDANSETRON HCL 4 MG/2ML IJ SOLN
4.0000 mg | Freq: Four times a day (QID) | INTRAMUSCULAR | Status: DC | PRN
  Administered 2019-06-01: 4 mg via INTRAVENOUS
  Filled 2019-05-31: qty 2

## 2019-05-31 MED ORDER — INSULIN ASPART 100 UNIT/ML ~~LOC~~ SOLN
0.0000 [IU] | Freq: Three times a day (TID) | SUBCUTANEOUS | Status: DC
  Administered 2019-05-31: 9 [IU] via SUBCUTANEOUS
  Filled 2019-05-31: qty 1

## 2019-05-31 MED ORDER — METHYLPREDNISOLONE SODIUM SUCC 40 MG IJ SOLR
40.0000 mg | Freq: Two times a day (BID) | INTRAMUSCULAR | Status: DC
  Administered 2019-05-31 – 2019-06-01 (×2): 40 mg via INTRAVENOUS
  Filled 2019-05-31 (×2): qty 1

## 2019-05-31 MED ORDER — ACETAMINOPHEN 650 MG RE SUPP: 650.0000 mg | Freq: Four times a day (QID) | RECTAL | Status: DC | PRN

## 2019-05-31 NOTE — ED Notes (Signed)
Meal tray given. Pt does not verbal any further needs at this time.

## 2019-05-31 NOTE — ED Notes (Signed)
Patient given breakfast tray. No further needs expressed at this time.

## 2019-05-31 NOTE — Progress Notes (Signed)
PROGRESS NOTE                                                                                                                                                                                                             Patient Demographics:    Jay Morris, is a 73 y.o. male, DOB - Jan 22, 1946, SLH:[id number]  Admit date - 05/31/2019   Admitting Physician No admitting provider for patient encounter.  Outpatient Primary MD for the patient is Sparks, Leonie Douglas, MD  LOS - 0    Chief Complaint  Patient presents with  . Shortness of Breath       Brief Narrative 73 year old male with history of A. fib, asthma, COPD (not on home O2), type 2 diabetes mellitus, hypertension and dyslipidemia presented to the ED with 2-day history of fever (T-max 103 F) associated with chills, generalized body ache, weakness and malaise.  This was associated with shortness of breath and PND.  Denies any chest pain, cough, wheezing or palpitation.  Denied exposure to COVID-19, sick contact or recent travel.  Reports nausea and watery diarrhea for past 2 days.  Also reported some epigastric discomfort with infraumbilical abdominal pain.  Reports losing almost 10 pounds during the last 4 days. He had a negative flu test done by his PCP and a COVID-19 was sent out. In the ED he was hypertensive with blood pressure 150/66 mmHg, low-grade fever of 90 9.72F and O2 sat dropped to 82% on ambulation on room air.  Patient found to have acute kidney injury with creatinine of 2.32 (baseline 1.3) Chest x-ray showed increased interstitial marking. He was given IV Solu-Medrol, 5 versus normal saline bolus, empiric IV Rocephin and azithromycin and hospitalist consulted for admission for further management.    Subjective:   Reports breathing slightly better, 3 episodes of diarrhea yday. Denies CP. Currently on 2l [place]   Assessment  & Plan :   Hospital course Acute  respiratory failure with hypoxia) Severe sepsis (HCC) Possibly associated with COPD with acute exacerbation however cannot rule out COVID-19 infection given his acute presentation of fever, dyspnea, generalized weakness, vomiting and diarrhea. Patient has elevated CRP and LDH but with normal procalcitonin and D-dimer. Monitor on telemetry.  Continuous O2 via nasal cannula (stable on 2 L).  Added IV Solu-Medrol (80 mg every 12 hours) and scheduled nebs.  Continue azithromycin. Follow blood cultures.  VQ scan ordered on admission to rule out PE  Active symptoms Acute kidney injury superimposed on chronic kidney disease stage IIIa Possibly prerenal ATN secondary to dehydration with poor p.o. intake, vomiting and diarrhea.  Monitor with IV fluids.  Avoid nephrotoxins.  Diuretic on hold.  Nausea, vomiting diarrhea Currently resolved.  Suspect this is due to his underlying viral illness. CT abdomen pelvis negative for acute findings.  Diabetes mellitus type 2 with hyperglycemia Continue Tresiba and sliding scale coverage  Paroxysmal A. fib Rate controlled on metoprolol.  Continue Pradaxa.  BPH Continue Flomax  Code Status : Full code  Family Communication  : None  Disposition Plan  : Home pending clinical improvement.  If tested positive for COVID-19 he will be transferred to [place]  Barriers For Discharge : Active symptoms  Consults  : None  Procedures  : CT abdomen pelvis.  VQ scan  DVT Prophylaxis  : Pradaxa  Lab Results  Component Value Date   PLT 161 05/31/2019    Antibiotics  :    Anti-infectives (From admission, onward)   Start     Dose/Rate Route Frequency Ordered Stop   05/31/19 0500  azithromycin (ZITHROMAX) 500 mg in sodium chloride 0.9 % 250 mL IVPB     500 mg 250 mL/hr over 60 Minutes Intravenous  Once 05/31/19 0458 05/31/19 0633   05/31/19 0430  cefTRIAXone (ROCEPHIN) 2 g in sodium chloride 0.9 % 100 mL IVPB     2 g 200 mL/hr over 30 Minutes  Intravenous  Once 05/31/19 0426 05/31/19 0527        Objective:   Vitals:   05/31/19 0520 05/31/19 0530 05/31/19 0600 05/31/19 0630  BP: (!) 150/66 (!) 129/56 (!) 148/67 138/66  Pulse:  78 84 83  Resp:  (!) 27 (!) 26 (!) 25  Temp:      TempSrc:      SpO2:  92% 93% 92%  Weight:      Height:        Wt Readings from Last 3 Encounters:  05/31/19 90.7 kg  12/29/17 87.3 kg  12/02/17 84.5 kg     Intake/Output Summary (Last 24 hours) at 05/31/2019 1016 Last data filed at 05/31/2019 0634 Gross per 24 hour  Intake 876.67 ml  Output -  Net 876.67 ml     Physical Exam  Gen: not in distress HEENT:  moist mucosa, supple neck Chest: diminished bibasilar breath sounds CVS: N S1&S2, no murmurs, rubs or gallop GI: soft, NT, ND,  Musculoskeletal: warm, no edema     Data Review:    CBC Recent Labs  Lab 05/31/19 0329  WBC 5.3  HGB 12.5*  HCT 37.1*  PLT 161  MCV 93.9  MCH 31.6  MCHC 33.7  RDW 13.6  LYMPHSABS 1.2  MONOABS 0.5  EOSABS 0.0  BASOSABS 0.0    Chemistries  Recent Labs  Lab 05/31/19 0329  NA 131*  K 4.2  CL 98  CO2 22  GLUCOSE 97  BUN 37*  CREATININE 2.32*  CALCIUM 7.6*  AST 39  ALT 26  ALKPHOS 30*  BILITOT 1.1   ------------------------------------------------------------------------------------------------------------------ No results for input(s): CHOL, HDL, LDLCALC, TRIG, CHOLHDL, LDLDIRECT in the last 72 hours.  Lab Results  Component Value Date   HGBA1C 8.0 (H) 01/07/2016   ------------------------------------------------------------------------------------------------------------------ No results for input(s): TSH, T4TOTAL, T3FREE, THYROIDAB in the last 72 hours.  Invalid input(s): FREET3 ------------------------------------------------------------------------------------------------------------------ Recent Labs    05/31/19  1610  FERRITIN 178    Coagulation profile No results for input(s): INR, PROTIME in the last 168  hours.  No results for input(s): DDIMER in the last 72 hours.  Cardiac Enzymes No results for input(s): CKMB, TROPONINI, MYOGLOBIN in the last 168 hours.  Invalid input(s): CK ------------------------------------------------------------------------------------------------------------------    Component Value Date/Time   BNP 37.0 05/31/2019 0616    Inpatient Medications  Scheduled Meds: . allopurinol  100 mg Oral Daily  . atorvastatin  20 mg Oral QHS  . dabigatran  150 mg Oral BID  . enoxaparin (LOVENOX) injection  40 mg Subcutaneous Q24H  . insulin aspart  0-9 Units Subcutaneous TID WC  . insulin degludec  100 Units Subcutaneous Q2200  . metoprolol tartrate  25 mg Oral BID  . omega-3 acid ethyl esters  4 g Oral Daily  . pantoprazole  40 mg Oral Daily  . tamsulosin  0.4 mg Oral QPM  . vitamin B-12  1,000 mcg Oral Daily   Continuous Infusions: PRN Meds:.acetaminophen **OR** acetaminophen, HYDROcodone-acetaminophen, magnesium hydroxide, methocarbamol, ondansetron **OR** ondansetron (ZOFRAN) IV, traMADol, traZODone  Micro Results No results found for this or any previous visit (from the past 240 hour(s)).  Radiology Reports Ct Abdomen Pelvis Wo Contrast  Result Date: 05/31/2019 CLINICAL DATA:  Weight loss, unintended, non-localized Abdominal pain, nausea vomiting and diarrhea. Shortness of breath. Fevers and chills over the last 2 days. Hypoxia EXAM: CT ABDOMEN AND PELVIS WITHOUT CONTRAST TECHNIQUE: Multidetector CT imaging of the abdomen and pelvis was performed following the standard protocol without IV contrast. COMPARISON:  CT of the abdomen and pelvis 11/21/2017 FINDINGS: Lower chest: Scarring of the lingula and right middle lobe is stable. Minimal dependent atelectasis is present. Heart size is normal. Hepatobiliary: There is diffuse fatty infiltration of the liver. No discrete lesions are present. The common bile duct and gallbladder are normal. Pancreas: Unremarkable. No  pancreatic ductal dilatation or surrounding inflammatory changes. Spleen: Normal in size without focal abnormality. Adrenals/Urinary Tract: Adrenal glands are normal bilaterally. Kidneys and ureters are within normal limits. There is no stone or mass lesion. No obstruction is present. The urinary bladder is distended. No focal abnormality is present. Stomach/Bowel: The stomach and duodenum are within normal limits. Small bowel is unremarkable. Terminal ileum is within normal limits. Appendix is visualized and normal. The ascending and transverse colon are normal. The descending and sigmoid colon are within normal limits. Vascular/Lymphatic: Atherosclerotic calcifications are present in the aorta and branch vessels 50% narrowing is present at the origin of the SMA. Moderate stenosis is present at the origin of the IMA. Dense atherosclerotic calcifications are present in the proximal iliac arteries bilaterally. Reproductive: Prostate is unremarkable. Other: No abdominal wall hernia or abnormality. No abdominopelvic ascites. Musculoskeletal: Advanced degenerative changes are present in the lower lumbar spine. No acute abnormalities are present. Bony pelvis is within normal limits. The hips are located and within normal limits. IMPRESSION: 1. No acute or focal lesion to explain the patient's symptoms. 2. Hepatic steatosis. 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 4. Aortic Atherosclerosis (ICD10-I70.0). Electronically Signed   By: San Morelle M.D.   On: 05/31/2019 07:11   Dg Chest Portable 1 View  Result Date: 05/31/2019 CLINICAL DATA:  Shortness of breath EXAM: PORTABLE CHEST 1 VIEW COMPARISON:  January 05, 2016 FINDINGS: The heart size and mediastinal contours are within normal limits. Aortic knob calcifications are seen. There is minimally increased interstitial markings seen  at the periphery. There is bibasilar subsegmental atelectasis. No large airspace  consolidation or pleural effusion. No acute osseous abnormality. IMPRESSION: Mildly increased interstitial markings which could be due to interstitial edema. Electronically Signed   By: Prudencio Pair M.D.   On: 05/31/2019 03:52    Time Spent in minutes  35   Adrienna Karis M.D on 05/31/2019 at 10:16 AM  Between 7am to 7pm - Pager - [phone]  After 7pm go to [email or web address] - password [facility]  [facility] -  Office  [phone]

## 2019-05-31 NOTE — ED Notes (Addendum)
Pt given meal tray.

## 2019-05-31 NOTE — ED Provider Notes (Signed)
[facility] Emergency Department Provider Note  ____________________________________________  Time seen: Approximately 4:29 AM  I have reviewed the triage vital signs and the nursing notes.   HISTORY  Chief Complaint Shortness of Breath    HPI Jay Morris is a 73 y.o. male with a history of atrial fibrillation COPD diabetes hypertension CHF who reports having subjective fevers and chills for the past 2 days as well as body aches and malaise.  Also notes shortness of breath when lying down flat.  Denies PND or dyspnea on exertion.  Denies chest pain.  Reports a 10 pound weight loss in the last several days.  He saw his doctor yesterday who told him to stop taking his daily diuretic until the current illness was resolved.  Flu test was negative.  Coronavirus test is pending.      Past Medical History:  Diagnosis Date  . Asthma   . Atrial fibrillation ([place])   . BPH (benign prostatic hyperplasia)   . COPD (chronic obstructive pulmonary disease) ([place])   . Diabetes mellitus without complication ([place])   . Helicobacter pylori gastritis   . Hyperlipidemia   . Hypertension   . Pneumonia      Patient Active Problem List   Diagnosis Date Noted  . At risk for bleeding 12/15/2017  . Hypotension 12/01/2017  . Iron deficiency anemia due to chronic blood loss   . GI bleed 11/21/2017  . Uncontrolled type 2 diabetes mellitus with hyperglycemia, with long-term current use of insulin ([place]) 11/24/2016  . Chronic gouty arthritis 04/16/2016  . COPD, moderate ([place]) 02/09/2016  . Pneumonia 01/04/2016  . Degeneration of lumbar intervertebral disc 10/28/2015  . Paroxysmal atrial fibrillation ([place]) 05/21/2014  . Hyperlipidemia 05/21/2014     Past Surgical History:  Procedure Laterality Date  . CARDIAC ELECTROPHYSIOLOGY MAPPING AND ABLATION    . COLONOSCOPY WITH PROPOFOL N/A 06/15/2017   Procedure: COLONOSCOPY WITH PROPOFOL;  Surgeon: Toledo, Benay Pike, MD;   Location: [facility] ENDOSCOPY;  Service: Gastroenterology;  Laterality: N/A;  . ENTEROSCOPY N/A 11/24/2017   Procedure: ENTEROSCOPY;  Surgeon: Jonathon Bellows, MD;  Location: [facility] ENDOSCOPY;  Service: Gastroenterology;  Laterality: N/A;  . ESOPHAGOGASTRODUODENOSCOPY N/A 11/27/2017   Procedure: ESOPHAGOGASTRODUODENOSCOPY (EGD);  Surgeon: Lucilla Lame, MD;  Location: [facility] ENDOSCOPY;  Service: Endoscopy;  Laterality: N/A;  . ESOPHAGOGASTRODUODENOSCOPY (EGD) WITH PROPOFOL N/A 06/02/2017   Procedure: ESOPHAGOGASTRODUODENOSCOPY (EGD) WITH PROPOFOL;  Surgeon: Toledo, Benay Pike, MD;  Location: [facility] ENDOSCOPY;  Service: Gastroenterology;  Laterality: N/A;  . GIVENS CAPSULE STUDY N/A 11/25/2017   Procedure: GIVENS CAPSULE STUDY;  Surgeon: Jonathon Bellows, MD;  Location: [facility] ENDOSCOPY;  Service: Gastroenterology;  Laterality: N/A;  . HAND SURGERY       Prior to Admission medications   Medication Sig Start Date End Date Taking? Authorizing Provider  allopurinol (ZYLOPRIM) 100 MG tablet Take 100 mg by mouth daily.    [provider]  atorvastatin (LIPITOR) 20 MG tablet Take 20 mg by mouth at bedtime. 12/09/15   [provider]  BREO ELLIPTA 100-25 MCG/INH AEPB Inhale 1 puff into the lungs daily. 12/04/15   [provider]  colchicine (COLCRYS) 0.6 MG tablet Colcrys 0.6 mg tablet    [provider]  fluticasone furoate-vilanterol (BREO ELLIPTA) 100-25 MCG/INH AEPB Breo Ellipta 100 mcg-25 mcg/dose powder for inhalation    [provider]  furosemide (LASIX) 20 MG tablet Take 20 mg by mouth daily. 12/29/15   [provider]  HYDROcodone-acetaminophen (NORCO/VICODIN) 5-325 MG tablet  10/18/17  [provider]  indomethacin (INDOCIN) 50 MG capsule Take 50 mg by mouth 3 (three) times daily as needed (gout).    [provider]  insulin aspart (NOVOLOG) 100 UNIT/ML injection Inject 15-25 Units into the skin 3 (three) times daily before meals. Inject 25 units  before breakfast, 15 units before lunch and 25 units before dinner.    [provider]  insulin degludec (TRESIBA FLEXTOUCH) 100 UNIT/ML SOPN FlexTouch Pen Inject 100 Units into the skin daily at 10 pm.     [provider]  insulin glargine (LANTUS) 100 UNIT/ML injection Lantus U-100 Insulin 100 unit/mL subcutaneous solution    [provider]  Insulin Pen Needle 31G X 8 MM MISC BD Ultra-Fine Short Pen Needle 31 gauge x 5/16"    [provider]  levofloxacin (LEVAQUIN) 750 MG tablet levofloxacin 750 mg tablet    [provider]  lisinopril (PRINIVIL,ZESTRIL) 10 MG tablet Take 1 tablet (10 mg total) by mouth at bedtime. 12/02/17   Vaughan Basta, MD  metFORMIN (GLUCOPHAGE) 1000 MG tablet Take 1,000 mg by mouth 2 (two) times daily. 12/17/15   [provider]  methocarbamol (ROBAXIN) 500 MG tablet methocarbamol 500 mg tablet    [provider]  metoprolol tartrate (LOPRESSOR) 25 MG tablet metoprolol tartrate 25 mg tablet 05/21/14   [provider]  omega-3 acid ethyl esters (LOVAZA) 1 g capsule Take 4 g by mouth daily. 12/29/15   [provider]  Omega-3 Fatty Acids (OMEGA 3 PO) Omega 3    [provider]  omeprazole (PRILOSEC) 40 MG capsule Take 1 capsule (40 mg total) by mouth daily. 12/29/17 02/28/18  Jonathon Bellows, MD  pantoprazole (PROTONIX) 40 MG tablet Take 1 tablet (40 mg total) by mouth daily. 12/02/17   Vaughan Basta, MD  PRADAXA 150 MG CAPS capsule Take 150 mg by mouth 2 (two) times daily. 12/11/15   [provider]  predniSONE (DELTASONE) 10 MG tablet prednisone 10 mg tablet    [provider]  tamsulosin (FLOMAX) 0.4 MG CAPS capsule Take 0.4 mg by mouth every evening.  12/17/15   [provider]  traMADol (ULTRAM) 50 MG tablet tramadol 50 mg tablet    [provider]  vitamin B-12 (CYANOCOBALAMIN) 1000 MCG tablet Take 1 tablet (1,000 mcg total) by mouth daily.  11/29/17   Epifanio Lesches, MD     Allergies Shrimp [shellfish allergy]   Family History  Problem Relation Age of Onset  . Heart disease Mother   . Cancer Father     Social History Social History   Tobacco Use  . Smoking status: Former Smoker    Packs/day: 1.00    Years: 30.00    Pack years: 30.00    Types: Cigarettes  . Smokeless tobacco: Never Used  Substance Use Topics  . Alcohol use: No    Alcohol/week: 0.0 standard drinks    Comment: occasional  . Drug use: No    Review of Systems  Constitutional:   Positive chills ENT:   No sore throat. No rhinorrhea. Cardiovascular:   No chest pain or syncope. Respiratory: Positive orthopnea without cough. Gastrointestinal:   Negative for abdominal pain, vomiting and diarrhea.  Musculoskeletal:   Negative for focal pain or swelling All other systems reviewed and are negative except as documented above in ROS and HPI.  ____________________________________________   PHYSICAL EXAM:  VITAL SIGNS: ED Triage Vitals  Enc Vitals Group     BP --  Pulse Rate 05/31/19 0329 80     Resp 05/31/19 0329 (!) 25     Temp 05/31/19 0326 99.3 F (37.4 C)     Temp Source 05/31/19 0326 Oral     SpO2 05/31/19 0329 91 %     Weight 05/31/19 0326 200 lb (90.7 kg)     Height 05/31/19 0326 _0  (1.803 m)     Head Circumference --      Peak Flow --      Pain Score 05/31/19 0326 3     Pain Loc --      Pain Edu? --      Excl. in [place]? --     Vital signs reviewed, nursing assessments reviewed.   Constitutional:   Alert and oriented. Non-toxic appearance. Eyes:   Conjunctivae are normal. EOMI. PERRL. ENT      Head:   Normocephalic and atraumatic.      Nose:   Wearing a mask.      Mouth/Throat:   Wearing a mask.      Neck:   No meningismus. Full ROM. Hematological/Lymphatic/Immunilogical:   No cervical lymphadenopathy. Cardiovascular:   RRR. Symmetric bilateral radial and DP pulses.  No murmurs. Cap refill less than 2  seconds. Respiratory:   Normal respiratory effort without tachypnea/retractions. Breath sounds are clear and equal bilaterally. No wheezes/rales/rhonchi. Gastrointestinal:   Soft and nontender. Non distended. There is no CVA tenderness.  No rebound, rigidity, or guarding. Musculoskeletal:   Normal range of motion in all extremities. No joint effusions.  No lower extremity tenderness.  No edema. Neurologic:   Normal speech and language.  Motor grossly intact. No acute focal neurologic deficits are appreciated.  Skin:    Skin is warm, dry and intact. No rash noted.  No petechiae, purpura, or bullae.  ____________________________________________    LABS (pertinent positives/negatives) (all labs ordered are listed, but only abnormal results are displayed) Labs Reviewed  CBC WITH DIFFERENTIAL/PLATELET - Abnormal; Notable for the following components:      Result Value   RBC 3.95 (*)    Hemoglobin 12.5 (*)    HCT 37.1 (*)    All other components within normal limits  BASIC METABOLIC PANEL - Abnormal; Notable for the following components:   Sodium 131 (*)    BUN 37 (*)    Creatinine, Ser 2.32 (*)    Calcium 7.6 (*)    GFR calc non Af Amer 26 (*)    GFR calc Af Amer 31 (*)    All other components within normal limits  HEPATIC FUNCTION PANEL - Abnormal; Notable for the following components:   Alkaline Phosphatase 30 (*)    All other components within normal limits  TROPONIN I (HIGH SENSITIVITY) - Abnormal; Notable for the following components:   Troponin I (High Sensitivity) 18 (*)    All other components within normal limits  SARS CORONAVIRUS 2 (TAT 6-24 HRS)  PROCALCITONIN  TROPONIN I (HIGH SENSITIVITY)   ____________________________________________   EKG  Interpreted by me Sinus rhythm rate of 81, normal axis and intervals.  Normal QRS ST segments and T waves.  ____________________________________________    RXYVOPFYT  Dg Chest Portable 1 View  Result Date:  05/31/2019 CLINICAL DATA:  Shortness of breath EXAM: PORTABLE CHEST 1 VIEW COMPARISON:  January 05, 2016 FINDINGS: The heart size and mediastinal contours are within normal limits. Aortic knob calcifications are seen. There is minimally increased interstitial markings seen at the periphery. There is bibasilar subsegmental atelectasis. No large airspace  consolidation or pleural effusion. No acute osseous abnormality. IMPRESSION: Mildly increased interstitial markings which could be due to interstitial edema. Electronically Signed   By: Prudencio Pair M.D.   On: 05/31/2019 03:52    ____________________________________________   PROCEDURES Procedures  ____________________________________________  DIFFERENTIAL DIAGNOSIS   Pneumonia, congestive heart failure, COPD exacerbation, COVID-19, pneumothorax, pulmonary edema, pleural effusion.  Doubt ACS PE dissection carditis.  CLINICAL IMPRESSION / ASSESSMENT AND PLAN / ED COURSE  Medications ordered in the ED: Medications  cefTRIAXone (ROCEPHIN) 2 g in sodium chloride 0.9 % 100 mL IVPB (2 g Intravenous New Bag/Given 05/31/19 0449)  azithromycin (ZITHROMAX) 500 mg in sodium chloride 0.9 % 250 mL IVPB (has no administration in time range)  sodium chloride 0.9 % bolus 500 mL (has no administration in time range)  methylPREDNISolone sodium succinate (SOLU-MEDROL) 125 mg/2 mL injection 125 mg (125 mg Intravenous Given 05/31/19 0448)    Pertinent labs & imaging results that were available during my care of the patient were reviewed by me and considered in my medical decision making (see chart for details).  Jay Morris was evaluated in Emergency Department on 05/31/2019 for the symptoms described in the history of present illness. He was evaluated in the context of the global COVID-19 pandemic, which necessitated consideration that the patient might be at risk for infection with the SARS-CoV-2 virus that causes COVID-19. Institutional protocols and  algorithms that pertain to the evaluation of patients at risk for COVID-19 are in a state of rapid change based on information released by regulatory bodies including the CDC and federal and state organizations. These policies and algorithms were followed during the patient's care in the ED.   Patient presents with orthopnea and constitutional symptoms.  Vital signs showed tachypnea but otherwise normal.  Lung exam is unremarkable.  Chest x-ray overall unremarkable but with some mild interstitial markings.  Initial labs are reassuring.  Will check a delta troponin, give a dose of IV Solu-Medrol and ceftriaxone for possible COPD exacerbation/community-acquired pneumonia.    ----------------------------------------- 5:14 AM on 05/31/2019 ----------------------------------------- With very brief ambulation in the treatment room, oxygenation dropped to 82% on room air.  Case discussed with the hospitalist for further management given his acute hypoxic respiratory failure. .    ____________________________________________   FINAL CLINICAL IMPRESSION(S) / ED DIAGNOSES    Final diagnoses:  Shortness of breath  Acute respiratory failure with hypoxia [facility])     ED Discharge Orders    None      Portions of this note were generated with dragon dictation software. Dictation errors may occur despite best attempts at proofreading.   Carrie Mew, MD 05/31/19 [phone]

## 2019-05-31 NOTE — ED Notes (Signed)
Patient transported to CT

## 2019-05-31 NOTE — Progress Notes (Signed)
*  PRELIMINARY RESULTS* Echocardiogram 2D Echocardiogram has been performed.  Jay Morris 05/31/2019, 3:06 PM

## 2019-05-31 NOTE — ED Triage Notes (Signed)
Pt to ED via EMS from home. Pt c/o sob worse when laying flat. Pt has hx of copd, chf. Pt only c/o chronic back pain. Pt arrives 92% on rrom air. Pt does not wear o2 at home.

## 2019-05-31 NOTE — ED Notes (Addendum)
ED TO INPATIENT HANDOFF REPORT  ED Nurse Name and Phone #: Karena Addison [phone]  D Name/Age/Gender Jay Morris 73 y.o. male Room/Bed: ED12A/ED12A  Code Status   Code Status: Full Code  Home/SNF/Other Home Patient oriented to: self, place, time and situation Is this baseline? Yes   Triage Complete: Triage complete  Chief Complaint [facility]  Triage Note Pt to ED via EMS from home. Pt c/o sob worse when laying flat. Pt has hx of copd, chf. Pt only c/o chronic back pain. Pt arrives 92% on rrom air. Pt does not wear o2 at home.    Allergies Allergies  Allergen Reactions  . Shrimp [Shellfish Allergy] Anaphylaxis    Level of Care/Admitting Diagnosis ED Disposition    ED Disposition Condition [facility] Area: [place] [[id number]]  Level of Care: Med-Surg [16]  Covid Evaluation: Person Under Investigation (PUI)  Diagnosis: AKI (acute kidney injury) [facility]) [[id number]]  Admitting Physician: Christel Mormon [[id number]]  Attending Physician: Christel Mormon [[id number]]  Estimated length of stay: past midnight tomorrow  Certification:: I certify this patient will need inpatient services for at least 2 midnights  PT Class (Do Not Modify): Inpatient [101]  PT Acc Code (Do Not Modify): Private [1]       B Medical/Surgery History Past Medical History:  Diagnosis Date  . Asthma   . Atrial fibrillation ([place])   . BPH (benign prostatic hyperplasia)   . COPD (chronic obstructive pulmonary disease) ([place])   . Diabetes mellitus without complication ([place])   . Helicobacter pylori gastritis   . Hyperlipidemia   . Hypertension   . Pneumonia    Past Surgical History:  Procedure Laterality Date  . CARDIAC ELECTROPHYSIOLOGY MAPPING AND ABLATION    . COLONOSCOPY WITH PROPOFOL N/A 06/15/2017   Procedure: COLONOSCOPY WITH PROPOFOL;  Surgeon: Toledo, Benay Pike, MD;  Location: [facility] ENDOSCOPY;  Service: Gastroenterology;  Laterality: N/A;  . ENTEROSCOPY N/A 11/24/2017    Procedure: ENTEROSCOPY;  Surgeon: Jonathon Bellows, MD;  Location: [facility] ENDOSCOPY;  Service: Gastroenterology;  Laterality: N/A;  . ESOPHAGOGASTRODUODENOSCOPY N/A 11/27/2017   Procedure: ESOPHAGOGASTRODUODENOSCOPY (EGD);  Surgeon: Lucilla Lame, MD;  Location: [facility] ENDOSCOPY;  Service: Endoscopy;  Laterality: N/A;  . ESOPHAGOGASTRODUODENOSCOPY (EGD) WITH PROPOFOL N/A 06/02/2017   Procedure: ESOPHAGOGASTRODUODENOSCOPY (EGD) WITH PROPOFOL;  Surgeon: Toledo, Benay Pike, MD;  Location: [facility] ENDOSCOPY;  Service: Gastroenterology;  Laterality: N/A;  . GIVENS CAPSULE STUDY N/A 11/25/2017   Procedure: GIVENS CAPSULE STUDY;  Surgeon: Jonathon Bellows, MD;  Location: [facility] ENDOSCOPY;  Service: Gastroenterology;  Laterality: N/A;  . HAND SURGERY       A IV Location/Drains/Wounds Patient Lines/Drains/Airways Status   Active Line/Drains/Airways    Name:   Placement date:   Placement time:   Site:   Days:   Peripheral IV 05/31/19 Right Hand   05/31/19    0339    Hand   less than 1          Intake/Output Last 24 hours  Intake/Output Summary (Last 24 hours) at 05/31/2019 2151 Last data filed at 05/31/2019 4481 Gross per 24 hour  Intake 876.67 ml  Output --  Net 876.67 ml    Labs/Imaging Results for orders placed or performed during the hospital encounter of 05/31/19 (from the past 48 hour(s))  CBC with Differential     Status: Abnormal   Collection Time: 05/31/19  3:29 AM  Result Value Ref Range   WBC 5.3 4.0 - 10.5 K/uL   RBC 3.95 (L)  4.22 - 5.81 MIL/uL   Hemoglobin 12.5 (L) 13.0 - 17.0 g/dL   HCT 37.1 (L) 39.0 - 52.0 %   MCV 93.9 80.0 - 100.0 fL   MCH 31.6 26.0 - 34.0 pg   MCHC 33.7 30.0 - 36.0 g/dL   RDW 13.6 11.5 - 15.5 %   Platelets 161 150 - 400 K/uL   nRBC 0.0 0.0 - 0.2 %   Neutrophils Relative % 67 %   Neutro Abs 3.5 1.7 - 7.7 K/uL   Lymphocytes Relative 22 %   Lymphs Abs 1.2 0.7 - 4.0 K/uL   Monocytes Relative 10 %   Monocytes Absolute 0.5 0.1 - 1.0 K/uL   Eosinophils Relative 0 %    Eosinophils Absolute 0.0 0.0 - 0.5 K/uL   Basophils Relative 0 %   Basophils Absolute 0.0 0.0 - 0.1 K/uL   Immature Granulocytes 1 %   Abs Immature Granulocytes 0.03 0.00 - 0.07 K/uL    Comment: Performed at [facility], [address] [place], [place] [address]  Basic metabolic panel     Status: Abnormal   Collection Time: 05/31/19  3:29 AM  Result Value Ref Range   Sodium 131 (L) 135 - 145 mmol/L   Potassium 4.2 3.5 - 5.1 mmol/L    Comment: HEMOLYSIS AT THIS LEVEL MAY AFFECT RESULT   Chloride 98 98 - 111 mmol/L   CO2 22 22 - 32 mmol/L   Glucose, Bld 97 70 - 99 mg/dL   BUN 37 (H) 8 - 23 mg/dL   Creatinine, Ser 2.32 (H) 0.61 - 1.24 mg/dL   Calcium 7.6 (L) 8.9 - 10.3 mg/dL   GFR calc non Af Amer 26 (L) >60 mL/min   GFR calc Af Amer 31 (L) >60 mL/min   Anion gap 11 5 - 15    Comment: Performed at [facility], [address] [place], [place] [address]  Hepatic function panel     Status: Abnormal   Collection Time: 05/31/19  3:29 AM  Result Value Ref Range   Total Protein 7.3 6.5 - 8.1 g/dL   Albumin 3.8 3.5 - 5.0 g/dL   AST 39 15 - 41 U/L   ALT 26 0 - 44 U/L   Alkaline Phosphatase 30 (L) 38 - 126 U/L   Total Bilirubin 1.1 0.3 - 1.2 mg/dL   Bilirubin, Direct 0.2 0.0 - 0.2 mg/dL   Indirect Bilirubin 0.9 0.3 - 0.9 mg/dL    Comment: Performed at [facility], [address], [place] [address]  Troponin I HS Add on     Status: Abnormal   Collection Time: 05/31/19  3:29 AM  Result Value Ref Range   Troponin I (High Sensitivity) 18 (H) <18 ng/L    Comment: (NOTE) Elevated high sensitivity troponin I (hsTnI) values and significant  changes across serial measurements may suggest ACS but many other  chronic and acute conditions are known to elevate hsTnI results.  Refer to the "Links" section for chest pain algorithms and additional  guidance. Performed at [facility], [address] [place], [place] [address]   Procalcitonin -  Baseline     Status: None   Collection Time: 05/31/19  5:34 AM  Result Value Ref Range   Procalcitonin <0.10 ng/mL    Comment:        Interpretation: PCT (Procalcitonin) <= 0.5 ng/mL: Systemic infection (sepsis) is not likely. Local bacterial infection is possible. (NOTE)       Sepsis  PCT Algorithm           Lower Respiratory Tract                                      Infection PCT Algorithm    ----------------------------     ----------------------------         PCT < 0.25 ng/mL                PCT < 0.10 ng/mL         Strongly encourage             Strongly discourage   discontinuation of antibiotics    initiation of antibiotics    ----------------------------     -----------------------------       PCT 0.25 - 0.50 ng/mL            PCT 0.10 - 0.25 ng/mL               OR       >80% decrease in PCT            Discourage initiation of                                            antibiotics      Encourage discontinuation           of antibiotics    ----------------------------     -----------------------------         PCT >= 0.50 ng/mL              PCT 0.26 - 0.50 ng/mL               AND        <80% decrease in PCT             Encourage initiation of                                             antibiotics       Encourage continuation           of antibiotics    ----------------------------     -----------------------------        PCT >= 0.50 ng/mL                  PCT > 0.50 ng/mL               AND         increase in PCT                  Strongly encourage                                      initiation of antibiotics    Strongly encourage escalation           of antibiotics                                     -----------------------------  PCT <= 0.25 ng/mL                                                 OR                                        > 80% decrease in PCT                                     Discontinue / Do not initiate                                              antibiotics Performed at [facility], [address] [place], [place] [address]   Troponin I HS Timed     Status: None   Collection Time: 05/31/19  5:34 AM  Result Value Ref Range   Troponin I (High Sensitivity) 17 <18 ng/L    Comment: (NOTE) Elevated high sensitivity troponin I (hsTnI) values and significant  changes across serial measurements may suggest ACS but many other  chronic and acute conditions are known to elevate hsTnI results.  Refer to the "Links" section for chest pain algorithms and additional  guidance. Performed at [facility], [address], [place] [address]   SARS CORONAVIRUS 2 (TAT 6-24 HRS) Nasopharyngeal Nasopharyngeal Swab     Status: None   Collection Time: 05/31/19  5:34 AM   Specimen: Nasopharyngeal Swab  Result Value Ref Range   SARS Coronavirus 2 NEGATIVE NEGATIVE    Comment: (NOTE) SARS-CoV-2 target nucleic acids are NOT DETECTED. The SARS-CoV-2 RNA is generally detectable in upper and lower respiratory specimens during the acute phase of infection. Negative results do not preclude SARS-CoV-2 infection, do not rule out co-infections with other pathogens, and should not be used as the sole basis for treatment or other patient management decisions. Negative results must be combined with clinical observations, patient history, and epidemiological information. The expected result is Negative. Fact Sheet for Patients: SugarRoll.be Fact Sheet for Healthcare Providers: https://www.woods-mathews.com/ This test is not yet approved or cleared by the [place] FDA and  has been authorized for detection and/or diagnosis of SARS-CoV-2 by FDA under an Emergency Use Authorization (EUA). This EUA will remain  in effect (meaning this test can be used) for the duration of the COVID-19 declaration under Section 56 4(b)(1) of the Act, 21  U.S.C. section 360bbb-3(b)(1), unless the authorization is terminated or revoked sooner. Performed at [facility], [place] [address], [place], [place] [address]   C-reactive protein     Status: Abnormal   Collection Time: 05/31/19  6:16 AM  Result Value Ref Range   CRP 2.1 (H) <1.0 mg/dL    Comment: Performed at [place] [address] [place], [place] [address]  Lactate dehydrogenase     Status: Abnormal   Collection Time: 05/31/19  6:16 AM  Result Value Ref Range   LDH 204 (H) 98 - 192 U/L    Comment: Performed at [facility], [address],  [place], [place] [address]  Ferritin     Status: None   Collection Time: 05/31/19  6:16 AM  Result Value Ref Range   Ferritin 178 24 - 336 ng/mL    Comment: Performed at [facility], [address] [place], [place] [address]  Fibrin derivatives D-Dimer [facility] only)     Status: None   Collection Time: 05/31/19  6:16 AM  Result Value Ref Range   Fibrin derivatives D-dimer ([facility]) 458.02 0.00 - 499.00 ng/mL (FEU)    Comment: (NOTE) <> Exclusion of Venous Thromboembolism (VTE) - OUTPATIENT ONLY   (Emergency Department or [place])   0-499 ng/ml (FEU): With a low to intermediate pretest probability                      for VTE this test result excludes the diagnosis                      of VTE.   >499 ng/ml (FEU) : VTE not excluded; additional work up for VTE is                      required. <> Testing on Inpatients and Evaluation of Disseminated Intravascular   Coagulation (DIC) Reference Range:   0-499 ng/ml (FEU) Performed at [facility], [address] [place], [place] [address]   Brain natriuretic peptide     Status: None   Collection Time: 05/31/19  6:16 AM  Result Value Ref Range   B Natriuretic Peptide 37.0 0.0 - 100.0 pg/mL    Comment: Performed at [facility], [address] [place], [place] [address]  Glucose, capillary     Status: Abnormal   Collection Time: 05/31/19   9:18 AM  Result Value Ref Range   Glucose-Capillary 121 (H) 70 - 99 mg/dL  Troponin I (High Sensitivity)     Status: None   Collection Time: 05/31/19 10:39 AM  Result Value Ref Range   Troponin I (High Sensitivity) 14 <18 ng/L    Comment: (NOTE) Elevated high sensitivity troponin I (hsTnI) values and significant  changes across serial measurements may suggest ACS but many other  chronic and acute conditions are known to elevate hsTnI results.  Refer to the "Links" section for chest pain algorithms and additional  guidance. Performed at [facility], [address] [place], [place] [address]   Glucose, capillary     Status: Abnormal   Collection Time: 05/31/19  1:38 PM  Result Value Ref Range   Glucose-Capillary 214 (H) 70 - 99 mg/dL  Hemoglobin A1c     Status: Abnormal   Collection Time: 05/31/19  3:25 PM  Result Value Ref Range   Hgb A1c MFr Bld 7.5 (H) 4.8 - 5.6 %    Comment: (NOTE) Pre diabetes:          5.7%-6.4% Diabetes:              >6.4% Glycemic control for   <7.0% adults with diabetes    Mean Plasma Glucose 168.55 mg/dL    Comment: Performed at [place] [address] [place], [place] [address]  Glucose, capillary     Status: Abnormal   Collection Time: 05/31/19  6:04 PM  Result Value Ref Range   Glucose-Capillary 384 (H) 70 - 99 mg/dL  Glucose, capillary     Status: Abnormal   Collection Time: 05/31/19  9:32 PM  Result Value Ref Range  Glucose-Capillary 313 (H) 70 - 99 mg/dL   Ct Abdomen Pelvis Wo Contrast  Result Date: 05/31/2019 CLINICAL DATA:  Weight loss, unintended, non-localized Abdominal pain, nausea vomiting and diarrhea. Shortness of breath. Fevers and chills over the last 2 days. Hypoxia EXAM: CT ABDOMEN AND PELVIS WITHOUT CONTRAST TECHNIQUE: Multidetector CT imaging of the abdomen and pelvis was performed following the standard protocol without IV contrast. COMPARISON:  CT of the abdomen and pelvis 11/21/2017 FINDINGS: Lower  chest: Scarring of the lingula and right middle lobe is stable. Minimal dependent atelectasis is present. Heart size is normal. Hepatobiliary: There is diffuse fatty infiltration of the liver. No discrete lesions are present. The common bile duct and gallbladder are normal. Pancreas: Unremarkable. No pancreatic ductal dilatation or surrounding inflammatory changes. Spleen: Normal in size without focal abnormality. Adrenals/Urinary Tract: Adrenal glands are normal bilaterally. Kidneys and ureters are within normal limits. There is no stone or mass lesion. No obstruction is present. The urinary bladder is distended. No focal abnormality is present. Stomach/Bowel: The stomach and duodenum are within normal limits. Small bowel is unremarkable. Terminal ileum is within normal limits. Appendix is visualized and normal. The ascending and transverse colon are normal. The descending and sigmoid colon are within normal limits. Vascular/Lymphatic: Atherosclerotic calcifications are present in the aorta and branch vessels 50% narrowing is present at the origin of the SMA. Moderate stenosis is present at the origin of the IMA. Dense atherosclerotic calcifications are present in the proximal iliac arteries bilaterally. Reproductive: Prostate is unremarkable. Other: No abdominal wall hernia or abnormality. No abdominopelvic ascites. Musculoskeletal: Advanced degenerative changes are present in the lower lumbar spine. No acute abnormalities are present. Bony pelvis is within normal limits. The hips are located and within normal limits. IMPRESSION: 1. No acute or focal lesion to explain the patient's symptoms. 2. Hepatic steatosis. 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 4. Aortic Atherosclerosis (ICD10-I70.0). Electronically Signed   By: San Morelle M.D.   On: 05/31/2019 07:11   Dg Chest Portable 1 View  Result Date: 05/31/2019 CLINICAL DATA:  Shortness of breath EXAM:  PORTABLE CHEST 1 VIEW COMPARISON:  January 05, 2016 FINDINGS: The heart size and mediastinal contours are within normal limits. Aortic knob calcifications are seen. There is minimally increased interstitial markings seen at the periphery. There is bibasilar subsegmental atelectasis. No large airspace consolidation or pleural effusion. No acute osseous abnormality. IMPRESSION: Mildly increased interstitial markings which could be due to interstitial edema. Electronically Signed   By: Prudencio Pair M.D.   On: 05/31/2019 03:52    Pending Labs Unresulted Labs (From admission, onward)    Start     Ordered   06/07/19 0500  Creatinine, serum  (enoxaparin (LOVENOX)    CrCl >/= 30 ml/min)  Weekly,   STAT    Comments: while on enoxaparin therapy    05/31/19 0543   06/01/19 0500  CBC  Tomorrow morning,   STAT     05/31/19 0543   06/01/19 0500  Comprehensive metabolic panel  Tomorrow morning,   STAT     05/31/19 0543   05/31/19 1322  CULTURE, BLOOD (ROUTINE X 2) w Reflex to ID Panel  BLOOD CULTURE X 2,   STAT     05/31/19 1321          Vitals/Pain Today's Vitals   05/31/19 2000 05/31/19 2030 05/31/19 2100 05/31/19 2130  BP: (!) 159/80 135/76 (!) 106/91 (!) 124/56  Pulse: 81 73 77 77  Resp: (!) 27 (!) 25 (!) 30 (!) 24  Temp:      TempSrc:      SpO2: 90% (!) 89% 91% 92%  Weight:      Height:      PainSc:        Isolation Precautions No active isolations  Medications Medications  allopurinol (ZYLOPRIM) tablet 100 mg (100 mg Oral Given 05/31/19 1032)  HYDROcodone-acetaminophen (NORCO/VICODIN) 5-325 MG per tablet 1 tablet (has no administration in time range)  traMADol (ULTRAM) tablet 50 mg (has no administration in time range)  atorvastatin (LIPITOR) tablet 20 mg (20 mg Oral Given 05/31/19 2144)  metoprolol tartrate (LOPRESSOR) tablet 25 mg (25 mg Oral Given 05/31/19 2146)  omega-3 acid ethyl esters (LOVAZA) capsule 4 g (4 g Oral Given 05/31/19 1029)  pantoprazole (PROTONIX) EC tablet 40 mg  (40 mg Oral Given 05/31/19 1029)  tamsulosin (FLOMAX) capsule 0.4 mg (0.4 mg Oral Given 05/31/19 1811)  dabigatran (PRADAXA) capsule 150 mg (150 mg Oral Given 05/31/19 2147)  vitamin B-12 (CYANOCOBALAMIN) tablet 1,000 mcg (1,000 mcg Oral Given 05/31/19 1032)  methocarbamol (ROBAXIN) tablet 500 mg (has no administration in time range)  acetaminophen (TYLENOL) tablet 650 mg (has no administration in time range)    Or  acetaminophen (TYLENOL) suppository 650 mg (has no administration in time range)  traZODone (DESYREL) tablet 25 mg (25 mg Oral Given 05/31/19 2144)  magnesium hydroxide (MILK OF MAGNESIA) suspension 30 mL (has no administration in time range)  ondansetron (ZOFRAN) tablet 4 mg (has no administration in time range)    Or  ondansetron (ZOFRAN) injection 4 mg (has no administration in time range)  0.9 %  sodium chloride infusion ( Intravenous New Bag/Given 05/31/19 1414)  methylPREDNISolone sodium succinate (SOLU-MEDROL) 40 mg/mL injection 40 mg (40 mg Intravenous Given 05/31/19 1415)  ipratropium-albuterol (DUONEB) 0.5-2.5 (3) MG/3ML nebulizer solution 3 mL (3 mLs Nebulization Given 05/31/19 2146)  azithromycin (ZITHROMAX) tablet 500 mg (has no administration in time range)  insulin glargine (LANTUS) injection 60 Units (60 Units Subcutaneous Given 05/31/19 2146)  insulin aspart (novoLOG) injection 0-9 Units (9 Units Subcutaneous Given 05/31/19 1810)  insulin aspart (novoLOG) injection 0-5 Units (4 Units Subcutaneous Given 05/31/19 2147)  methylPREDNISolone sodium succinate (SOLU-MEDROL) 125 mg/2 mL injection 125 mg (125 mg Intravenous Given 05/31/19 0448)  cefTRIAXone (ROCEPHIN) 2 g in sodium chloride 0.9 % 100 mL IVPB (0 g Intravenous Stopped 05/31/19 0527)  azithromycin (ZITHROMAX) 500 mg in sodium chloride 0.9 % 250 mL IVPB (0 mg Intravenous Stopped 05/31/19 0633)  sodium chloride 0.9 % bolus 500 mL (0 mLs Intravenous Stopped 05/31/19 0634)  furosemide (LASIX) injection 20 mg (20  mg Intravenous Given 05/31/19 0174)    Mobility walks Low fall risk   Focused Assessments    R Recommendations: See Admitting Provider Note  Report given to: Claiborne Billings, RN

## 2019-05-31 NOTE — ED Notes (Signed)
pts oxygen dropped to 82% while ambulating

## 2019-05-31 NOTE — H&P (Signed)
[facility] [place] NAME: Jay Morris    MR#:  [id number]  DATE OF BIRTH:  1945/10/05  DATE OF ADMISSION:  05/31/2019  PRIMARY CARE PHYSICIAN: [name], MD   REQUESTING/REFERRING PHYSICIAN: Brenton Grills, MD  CHIEF COMPLAINT:   Chief Complaint  Patient presents with   Shortness of Breath    HISTORY OF PRESENT ILLNESS:  Jay Morris  is a 73 y.o. male with a known history of asthma, atrial fibrillation, COPD, type 2 diabetes mellitus, hypertension and dyslipidemia, presented to the emergency room with acute onset of 2-day history of fever with a T-max of 103 and chills with associated body aching, generalized weakness, fatigue and malaise.  He admitted to dyspnea with paroxysmal nocturnal dyspnea and dyspnea on exertion without orthopnea or lower extremity edema. No chest pain or palpitations or cough or wheezing.  No loss of taste or smell.  No exposure to COVID-19.  He admitted to nausea and vomiting as well as watery diarrhea with no bilious vomitus hematemesis, melena or bright red bleeding per rectum.  He admitted to mild epigastric and infraumbilical abdominal pain.  He lost about 10 pounds during the last 4 days.  He had a negative flu test when he was seen by his primary care physician yesterday and his COVID-19 test on outpatient basis is currently pending.  He was advised by his PCP to stop his Lasix.  No dysuria, oliguria or hematuria or flank pain.  No rhinorrhea or nasal congestion or sore throat or earache.  He had mild dizziness without headache or diplopia.  No paresthesias or focal muscle weakness.  Upon presentation to the emergency room, temperature was 99.3 respiratory to 25, heart rate 82, blood pressure 150/66 and pulse oximetry 91% on room air dropped to 82% upon ambulation for short distance.  Labs revealed a BUN of 36 and creatinine of 2.32 with GFR of 26, compared to 21/1.48/45 respectively on 12/02/2017.  CBC showed anemia  with hemoglobin of 12.5 hematocrit of 37.1 better than his previous levels and very close to his baseline.  For which x-ray showed mild increase interstitial markings which could be due to interstitial pulmonary edema.  EKG showed normal sinus rhythm with a rate of 81.  The patient was given IV Rocephin, Zithromax and Solu-Medrol with 500 mill IV normal saline bolus.  He will be admitted to a medical monitored bed for further evaluation and management   PAST MEDICAL HISTORY:   Past Medical History:  Diagnosis Date   Asthma    Atrial fibrillation (HCC)    BPH (benign prostatic hyperplasia)    COPD (chronic obstructive pulmonary disease) ([place])    Diabetes mellitus without complication ([place])    Helicobacter pylori gastritis    Hyperlipidemia    Hypertension    Pneumonia     PAST SURGICAL HISTORY:   Past Surgical History:  Procedure Laterality Date   CARDIAC ELECTROPHYSIOLOGY MAPPING AND ABLATION     COLONOSCOPY WITH PROPOFOL N/A 06/15/2017   Procedure: COLONOSCOPY WITH PROPOFOL;  Surgeon: Toledo, Benay Pike, MD;  Location: [facility] ENDOSCOPY;  Service: Gastroenterology;  Laterality: N/A;   ENTEROSCOPY N/A 11/24/2017   Procedure: ENTEROSCOPY;  Surgeon: Jonathon Bellows, MD;  Location: [facility] ENDOSCOPY;  Service: Gastroenterology;  Laterality: N/A;   ESOPHAGOGASTRODUODENOSCOPY N/A 11/27/2017   Procedure: ESOPHAGOGASTRODUODENOSCOPY (EGD);  Surgeon: Lucilla Lame, MD;  Location: [facility] ENDOSCOPY;  Service: Endoscopy;  Laterality: N/A;   ESOPHAGOGASTRODUODENOSCOPY (EGD) WITH PROPOFOL N/A 06/02/2017   Procedure: ESOPHAGOGASTRODUODENOSCOPY (EGD)  WITH PROPOFOL;  Surgeon: Toledo, Benay Pike, MD;  Location: [facility] ENDOSCOPY;  Service: Gastroenterology;  Laterality: N/A;   GIVENS CAPSULE STUDY N/A 11/25/2017   Procedure: GIVENS CAPSULE STUDY;  Surgeon: Jonathon Bellows, MD;  Location: [facility] ENDOSCOPY;  Service: Gastroenterology;  Laterality: N/A;   HAND SURGERY      SOCIAL HISTORY:   Social History    Tobacco Use   Smoking status: Former Smoker    Packs/day: 1.00    Years: 30.00    Pack years: 30.00    Types: Cigarettes   Smokeless tobacco: Never Used  Substance Use Topics   Alcohol use: No    Alcohol/week: 0.0 standard drinks    Comment: occasional    FAMILY HISTORY:   Family History  Problem Relation Age of Onset   Heart disease Mother    Cancer Father     DRUG ALLERGIES:   Allergies  Allergen Reactions   Shrimp [Shellfish Allergy] Anaphylaxis    REVIEW OF SYSTEMS:   ROS As per history of present illness. All pertinent systems were reviewed above. Constitutional,  HEENT, cardiovascular, respiratory, GI, GU, musculoskeletal, neuro, psychiatric, endocrine,  integumentary and hematologic systems were reviewed and are otherwise  negative/unremarkable except for positive findings mentioned above in the HPI.   MEDICATIONS AT HOME:   Prior to Admission medications   Medication Sig Start Date End Date Taking? Authorizing Provider  allopurinol (ZYLOPRIM) 100 MG tablet Take 100 mg by mouth daily.   Yes [provider]  atorvastatin (LIPITOR) 20 MG tablet Take 20 mg by mouth at bedtime. 12/09/15  Yes [provider]  BREO ELLIPTA 100-25 MCG/INH AEPB Inhale 1 puff into the lungs daily. 12/04/15  Yes [provider]  colchicine (COLCRYS) 0.6 MG tablet Take 0.6 mg by mouth as needed.    Yes [provider]  lisinopril (PRINIVIL,ZESTRIL) 10 MG tablet Take 1 tablet (10 mg total) by mouth at bedtime. Patient taking differently: Take 40 mg by mouth daily.  12/02/17  Yes Vaughan Basta, MD  omeprazole (PRILOSEC) 40 MG capsule Take 1 capsule (40 mg total) by mouth daily. 12/29/17 05/31/19 Yes Jonathon Bellows, MD  vitamin B-12 (CYANOCOBALAMIN) 1000 MCG tablet Take 1 tablet (1,000 mcg total) by mouth daily. 11/29/17  Yes Epifanio Lesches, MD  furosemide (LASIX) 20 MG tablet Take 20 mg by mouth daily. 12/29/15   [provider]   HYDROcodone-acetaminophen (NORCO/VICODIN) 5-325 MG tablet  10/18/17   [provider]  indomethacin (INDOCIN) 50 MG capsule Take 50 mg by mouth 3 (three) times daily as needed (gout).    [provider]  insulin aspart (NOVOLOG) 100 UNIT/ML injection Inject 15-25 Units into the skin 3 (three) times daily before meals. Inject 25 units before breakfast, 15 units before lunch and 25 units before dinner.    [provider]  insulin degludec (TRESIBA FLEXTOUCH) 100 UNIT/ML SOPN FlexTouch Pen Inject 100 Units into the skin daily at 10 pm.     [provider]  insulin glargine (LANTUS) 100 UNIT/ML injection Lantus U-100 Insulin 100 unit/mL subcutaneous solution    [provider]  Insulin Pen Needle 31G X 8 MM MISC BD Ultra-Fine Short Pen Needle 31 gauge x 5/16"    [provider]  levofloxacin (LEVAQUIN) 750 MG tablet levofloxacin 750 mg tablet    [provider]  metFORMIN (GLUCOPHAGE) 1000 MG tablet Take 1,000 mg by mouth 2 (two) times daily. 12/17/15   [provider]  methocarbamol (ROBAXIN) 500 MG tablet methocarbamol 500  mg tablet    [provider]  metoprolol tartrate (LOPRESSOR) 25 MG tablet metoprolol tartrate 25 mg tablet 05/21/14   [provider]  omega-3 acid ethyl esters (LOVAZA) 1 g capsule Take 4 g by mouth daily. 12/29/15   [provider]  Omega-3 Fatty Acids (OMEGA 3 PO) Omega 3    [provider]  pantoprazole (PROTONIX) 40 MG tablet Take 1 tablet (40 mg total) by mouth daily. Patient not taking: Reported on 05/31/2019 12/02/17   Vaughan Basta, MD  PRADAXA 150 MG CAPS capsule Take 150 mg by mouth 2 (two) times daily. 12/11/15   [provider]  predniSONE (DELTASONE) 10 MG tablet prednisone 10 mg tablet    [provider]  tamsulosin (FLOMAX) 0.4 MG CAPS capsule Take 0.4 mg by mouth every evening.  12/17/15   [provider]  traMADol (ULTRAM) 50 MG  tablet tramadol 50 mg tablet    [provider]      VITAL SIGNS:  Blood pressure (!) 150/66, pulse 80, temperature 99.3 F (37.4 C), temperature source Oral, resp. rate (!) 25, height _0  (1.803 m), weight 90.7 kg, SpO2 91 %.  PHYSICAL EXAMINATION:  Physical Exam  GENERAL:  73 y.o.-year-old patient lying in the bed with no acute distress.  EYES: Pupils equal, round, reactive to light and accommodation. No scleral icterus. Extraocular muscles intact.  HEENT: Head atraumatic, normocephalic. Oropharynx and nasopharynx clear.  NECK:  Supple, no jugular venous distention. No thyroid enlargement, no tenderness.  LUNGS: Slightly diminished bibasilar breath sounds with minimal bibasilar rales with no wheezing or rhonchi. No use of accessory muscles of respiration.  CARDIOVASCULAR: Regular rate and rhythm, S1, S2 normal. No murmurs, rubs, or gallops.  ABDOMEN: Soft, nondistended, nontender. Bowel sounds present. No organomegaly or mass.  EXTREMITIES: No pedal edema, cyanosis, or clubbing.  NEUROLOGIC: Cranial nerves II through XII are intact. Muscle strength 5/5 in all extremities. Sensation intact. Gait not checked.  PSYCHIATRIC: The patient is alert and oriented x 3.  Normal affect and good eye contact. SKIN: No obvious rash, lesion, or ulcer.   LABORATORY PANEL:   CBC Recent Labs  Lab 05/31/19 0329  WBC 5.3  HGB 12.5*  HCT 37.1*  PLT 161   ------------------------------------------------------------------------------------------------------------------  Chemistries  Recent Labs  Lab 05/31/19 0329  NA 131*  K 4.2  CL 98  CO2 22  GLUCOSE 97  BUN 37*  CREATININE 2.32*  CALCIUM 7.6*  AST 39  ALT 26  ALKPHOS 30*  BILITOT 1.1   ------------------------------------------------------------------------------------------------------------------  Cardiac Enzymes No results for input(s): TROPONINI in the last 168  hours. ------------------------------------------------------------------------------------------------------------------  RADIOLOGY:  Dg Chest Portable 1 View  Result Date: 05/31/2019 CLINICAL DATA:  Shortness of breath EXAM: PORTABLE CHEST 1 VIEW COMPARISON:  January 05, 2016 FINDINGS: The heart size and mediastinal contours are within normal limits. Aortic knob calcifications are seen. There is minimally increased interstitial markings seen at the periphery. There is bibasilar subsegmental atelectasis. No large airspace consolidation or pleural effusion. No acute osseous abnormality. IMPRESSION: Mildly increased interstitial markings which could be due to interstitial edema. Electronically Signed   By: Prudencio Pair M.D.   On: 05/31/2019 03:52      IMPRESSION AND PLAN:   1.  Febrile illness with associated generalized weakness, nausea, vomiting and diarrhea.  This could be secondary to viral syndrome with COVID-19 highly in the differential diagnosis especially given associated dyspnea with hypoxemia, worsening on exertion.  He has been having recent weight  loss and has stopped his Lasix since yesterday.  His outpatient and ER COVID-19 tests are currently pending.  -The patient will be admitted to medical monitored bed. -We will place him on contact and droplet isolation for now. -Will obtain inflammatory markers including CRP, LDH, ferritin and D-dimer. -We will obtain a VQ scan to rule out PE given dyspnea and hypoxia.  O2 protocol will be followed. -The patient has already received a dose of IV Solu-Medrol as well as IV Rocephin and Zithromax.  I will hold off on any further antibiotic therapy or steroids at this time.  2.  Acute gastroenteritis with associated abdominal pain.  -We will obtain noncontrasted abdominal and pelvic CT scan for further assessment.  I will hold off on further stool studies given the likelihood of viral etiology.  The patient has not had any recent  antibiotics.  3.  Acute pulmonary edema.  This could be associated with acute CHF likely diastolic.  I ordered BNP.  I gave 1 dose of 20 mg of IV Lasix pending further assessment with VQ scan.  Will follow troponin I level.  Will obtain a 2D echo.  4.  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  He was given a bolus of 500 mill of IV normal saline.  I will hold off on further fluids given his chest x-ray findings.  He could be having renal hypoperfusion from mild acute CHF.  We will hold off his lisinopril, Indocin and metformin.  5.  Type 2 diabetes mellitus.  He will be placed on supplement coverage with NovoLog and will continue his basal coverage.  6.  Paroxysmal atrial fibrillation.  We will continue his Pradaxa.  7.  Dyslipidemia.  Statin therapy will be continued.  8.  Gout.  Allopurinol will be continued.  9.  DVT prophylaxis.  This is covered with Pradaxa.  GI prophylaxis with continued PPI therapy   All the records are reviewed and case discussed with ED provider. The plan of care was discussed in details with the patient. I answered all questions. The patient agreed to proceed with the above mentioned plan. Further management will depend upon hospital course.   CODE STATUS: Full code  TOTAL TIME TAKING CARE OF THIS PATIENT: 65 minutes.    Christel Mormon M.D on 05/31/2019 at [place] AM  [facility]   From 7 PM-7 AM, contact night-coverage [email or web address]  CC: Primary care physician; [name], MD   Note: This dictation was prepared with Dragon dictation along with smaller phrase technology. Any transcriptional errors that result from this process are unintentional.

## 2019-06-01 ENCOUNTER — Encounter: Payer: Self-pay | Admitting: Radiology

## 2019-06-01 ENCOUNTER — Inpatient Hospital Stay: Payer: Medicare HMO

## 2019-06-01 DIAGNOSIS — Z794 Long term (current) use of insulin: Secondary | ICD-10-CM

## 2019-06-01 DIAGNOSIS — J441 Chronic obstructive pulmonary disease with (acute) exacerbation: Secondary | ICD-10-CM | POA: Diagnosis present

## 2019-06-01 DIAGNOSIS — U071 COVID-19: Secondary | ICD-10-CM | POA: Diagnosis present

## 2019-06-01 DIAGNOSIS — N179 Acute kidney failure, unspecified: Secondary | ICD-10-CM | POA: Diagnosis present

## 2019-06-01 DIAGNOSIS — E1165 Type 2 diabetes mellitus with hyperglycemia: Secondary | ICD-10-CM

## 2019-06-01 DIAGNOSIS — M6281 Muscle weakness (generalized): Secondary | ICD-10-CM | POA: Diagnosis present

## 2019-06-01 DIAGNOSIS — N1831 Chronic kidney disease, stage 3a: Secondary | ICD-10-CM | POA: Diagnosis present

## 2019-06-01 LAB — COMPREHENSIVE METABOLIC PANEL
ALT: 24 U/L (ref 0–44)
AST: 30 U/L (ref 15–41)
Albumin: 3.2 g/dL — ABNORMAL LOW (ref 3.5–5.0)
Alkaline Phosphatase: 24 U/L — ABNORMAL LOW (ref 38–126)
Anion gap: 10 (ref 5–15)
BUN: 40 mg/dL — ABNORMAL HIGH (ref 8–23)
CO2: 22 mmol/L (ref 22–32)
Calcium: 7.5 mg/dL — ABNORMAL LOW (ref 8.9–10.3)
Chloride: 102 mmol/L (ref 98–111)
Creatinine, Ser: 1.8 mg/dL — ABNORMAL HIGH (ref 0.61–1.24)
GFR calc Af Amer: 42 mL/min — ABNORMAL LOW (ref >60)
GFR calc non Af Amer: 36 mL/min — ABNORMAL LOW (ref >60)
Glucose, Bld: 235 mg/dL — ABNORMAL HIGH (ref 70–99)
Potassium: 4.3 mmol/L (ref 3.5–5.1)
Sodium: 134 mmol/L — ABNORMAL LOW (ref 135–145)
Total Bilirubin: 0.6 mg/dL (ref 0.3–1.2)
Total Protein: 6.3 g/dL — ABNORMAL LOW (ref 6.5–8.1)

## 2019-06-01 LAB — C-REACTIVE PROTEIN: CRP: 1.4 mg/dL — ABNORMAL HIGH (ref <1.0)

## 2019-06-01 LAB — CBC
HCT: 33.4 % — ABNORMAL LOW (ref 39.0–52.0)
Hemoglobin: 11.1 g/dL — ABNORMAL LOW (ref 13.0–17.0)
MCH: 31.1 pg (ref 26.0–34.0)
MCHC: 33.2 g/dL (ref 30.0–36.0)
MCV: 93.6 fL (ref 80.0–100.0)
Platelets: 156 10*3/uL (ref 150–400)
RBC: 3.57 MIL/uL — ABNORMAL LOW (ref 4.22–5.81)
RDW: 13.4 % (ref 11.5–15.5)
WBC: 6.4 10*3/uL (ref 4.0–10.5)
nRBC: 0 % (ref 0.0–0.2)

## 2019-06-01 LAB — GLUCOSE, CAPILLARY
Glucose-Capillary: 221 mg/dL — ABNORMAL HIGH (ref 70–99)
Glucose-Capillary: 236 mg/dL — ABNORMAL HIGH (ref 70–99)
Glucose-Capillary: 201 mg/dL — ABNORMAL HIGH (ref 70–99)
Glucose-Capillary: 108 mg/dL — ABNORMAL HIGH (ref 70–99)

## 2019-06-01 LAB — FIBRIN DERIVATIVES D-DIMER (ARMC ONLY): Fibrin derivatives D-dimer (ARMC): 485.02 ng/mL (FEU) (ref 0.00–499.00)

## 2019-06-01 LAB — INFLUENZA PANEL BY PCR (TYPE A & B)
Influenza A By PCR: NEGATIVE
Influenza B By PCR: NEGATIVE

## 2019-06-01 MED ORDER — INSULIN ASPART 100 UNIT/ML ~~LOC~~ SOLN
5.0000 [IU] | Freq: Three times a day (TID) | SUBCUTANEOUS | Status: DC
  Administered 2019-06-01: 5 [IU] via SUBCUTANEOUS
  Filled 2019-06-01: qty 1

## 2019-06-01 MED ORDER — PREDNISONE 20 MG PO TABS
40.0000 mg | ORAL_TABLET | Freq: Two times a day (BID) | ORAL | Status: DC
  Administered 2019-06-01: 40 mg via ORAL
  Filled 2019-06-01: qty 2

## 2019-06-01 MED ORDER — PREDNISONE 20 MG PO TABS: 40.0000 mg | ORAL_TABLET | Freq: Two times a day (BID) | ORAL | 0 refills | Status: DC

## 2019-06-01 MED ORDER — INSULIN GLARGINE 100 UNIT/ML ~~LOC~~ SOLN
70.0000 [IU] | Freq: Every day | SUBCUTANEOUS | Status: DC
  Administered 2019-06-01: 70 [IU] via SUBCUTANEOUS
  Filled 2019-06-01: qty 0.7

## 2019-06-01 MED ORDER — INSULIN ASPART 100 UNIT/ML ~~LOC~~ SOLN
0.0000 [IU] | Freq: Three times a day (TID) | SUBCUTANEOUS | Status: DC
  Administered 2019-06-01 (×3): 5 [IU] via SUBCUTANEOUS
  Filled 2019-06-01 (×3): qty 1

## 2019-06-01 MED ORDER — METOPROLOL TARTRATE 25 MG PO TABS: 25.0000 mg | ORAL_TABLET | Freq: Two times a day (BID) | ORAL | 0 refills | Status: DC

## 2019-06-01 MED ORDER — TECHNETIUM TC 99M DIETHYLENETRIAME-PENTAACETIC ACID
30.0000 | Freq: Once | INTRAVENOUS | Status: AC | PRN
  Administered 2019-06-01: 31.98 via RESPIRATORY_TRACT

## 2019-06-01 MED ORDER — PREDNISONE 20 MG PO TABS: 40.0000 mg | ORAL_TABLET | Freq: Every day | ORAL | Status: DC

## 2019-06-01 MED ORDER — IPRATROPIUM-ALBUTEROL 0.5-2.5 (3) MG/3ML IN SOLN: 3.0000 mL | Freq: Four times a day (QID) | RESPIRATORY_TRACT | Status: DC

## 2019-06-01 MED ORDER — SODIUM CHLORIDE 0.9 % IV SOLN
200.0000 mg | Freq: Once | INTRAVENOUS | Status: AC
  Administered 2019-06-01: 200 mg via INTRAVENOUS
  Filled 2019-06-01: qty 40

## 2019-06-01 MED ORDER — PROMETHAZINE HCL 25 MG/ML IJ SOLN
12.5000 mg | Freq: Four times a day (QID) | INTRAMUSCULAR | Status: DC | PRN
  Administered 2019-06-01: 12.5 mg via INTRAVENOUS
  Filled 2019-06-01: qty 1

## 2019-06-01 MED ORDER — IPRATROPIUM-ALBUTEROL 20-100 MCG/ACT IN AERS
1.0000 | INHALATION_SPRAY | Freq: Four times a day (QID) | RESPIRATORY_TRACT | Status: DC
  Administered 2019-06-01 – 2019-06-02 (×3): 1 via RESPIRATORY_TRACT
  Filled 2019-06-01: qty 4

## 2019-06-01 MED ORDER — TECHNETIUM TO 99M ALBUMIN AGGREGATED
4.0000 | Freq: Once | INTRAVENOUS | Status: AC | PRN
  Administered 2019-06-01: 4.12 via INTRAVENOUS

## 2019-06-01 MED ORDER — FUROSEMIDE 10 MG/ML IJ SOLN
40.0000 mg | Freq: Once | INTRAMUSCULAR | Status: AC
  Administered 2019-06-01: 40 mg via INTRAVENOUS
  Filled 2019-06-01: qty 4

## 2019-06-01 NOTE — Progress Notes (Addendum)
Inpatient Diabetes Program Recommendations  AACE/ADA: New Consensus Statement on Inpatient Glycemic Control (2015)  Target Ranges:  Prepandial:   less than 140 mg/dL      Peak postprandial:   less than 180 mg/dL (1-2 hours)      Critically ill patients:  140 - 180 mg/dL   Results for Jay Morris, Jay Morris (MRN [id number]) as of 06/01/2019 12:20  Ref. Range 05/31/2019 09:18 05/31/2019 13:38 05/31/2019 18:04 05/31/2019 21:32  Glucose-Capillary Latest Ref Range: 70 - 99 mg/dL 121 (H)  1 unit NOVOLOG  214 (H)  3 units NOVOLOG  384 (H)  9 units NOVOLOG  313 (H)  4 units NOVOLOG +  60 units LANTUS    Results for Jay Morris, Jay Morris (MRN [id number]) as of 06/01/2019 12:20  Ref. Range 06/01/2019 08:17 06/01/2019 11:26  Glucose-Capillary Latest Ref Range: 70 - 99 mg/dL 221 (H)  5 units NOVOLOG  236 (H)    Admit Sepsis/ Hypoxia/ Febrile illness with associated weakness/ N/V/D  History: DM, COPD   Home DM Meds: Tresiba 120 units QHS        Novolog 30 units TID          Metformin 1000 mg BID   Current Orders: Lantus 60 units QHS      Novolog Moderate Correction Scale/ SSI (0-15 units) TID AC + HS   ENDO: Dr. Quentin Cornwall seen 04/06/2019--Was told to adjust Insulin to the following: Tresiba 110 QHS and Novolog 30-38 units with Breakfast and Lunch per SSI and 40-48 units with Dinner per SSI (see Dr. Joycie Peek notes for the scale details)      Novolog started yest AM--Lantus started last PM.   Pt got Solumedrol 125 mg X 1 dose yest at 5am and was getting Solumedrol 40 mg BID. Solumedrol stopped today (last dose given at 2am) and will start Prednisone 40 mg Daily tomorrow AM.    MD- Please consider the following in-hospital insulin adjustments:  1. Increase Lantus to 70 units QHS  2. Start Novolog Meal Coverage:  Novolog 8 units TID with meals (~25% total home dose to start)  (Please add the following Hold Parameters: Hold if pt eats <50% of meal, Hold if pt NPO)      --Will  follow patient during hospitalization--  Wyn Quaker RN, MSN, CDE Diabetes Coordinator Inpatient Glycemic Control Team Team Pager: [phone] (8a-5p)

## 2019-06-01 NOTE — Evaluation (Signed)
Physical Therapy Evaluation Patient Details Name: Jay Morris MRN: [id number] DOB: [birth year] Today's Date: 06/01/2019   History of Present Illness  From MD H&P: Pt is a 73 y.o. male with a known history of asthma, atrial fibrillation, COPD, type 2 diabetes mellitus, hypertension and dyslipidemia, presented to the emergency room with acute onset of 2-day history of fever with a T-max of 103 and chills with associated body aching, generalized weakness, fatigue and malaise.  He admitted to dyspnea with paroxysmal nocturnal dyspnea and dyspnea on exertion without orthopnea or lower extremity edema.  He admitted to nausea and vomiting as well as watery diarrhea.  He admitted to mild epigastric and infraumbilical abdominal pain.  He lost about 10 pounds during the last 4 days.  He had mild dizziness without headache or diplopia.  Upon presentation to the emergency room, temperature was 99.3 respiratory to 25, heart rate 82, blood pressure 150/66 and pulse oximetry 91% on room air dropped to 82% upon ambulation for short distance.  CBC showed anemia with hemoglobin of 12.5 hematocrit of 37.1 better than his previous levels and very close to his baseline.  For which x-ray showed mild increase interstitial markings which could be due to interstitial pulmonary edema.  He will be admitted to a medical monitored bed for further evaluation and management.  MD assessment includes: Acute kidney injury superimposed on chronic kidney disease stage IIIa, N&V, diarrhea, DM II with hyperglycemia, A-fib, and general weakness.    Clinical Impression  Pt presented with deficits in strength, transfers, mobility, gait, balance, and activity tolerance.  Pt required significant time and effort to go from sup to sit.  Pt was able to stand with some effort from an elevated EOB but did not require physical assistance.  Pt ambulated 1 x 3' and after a seated rest ambulated 1 x 10' which was his max distance.  Pt's RR was in the low  30s from the effort and required 60-90 sec to return to baseline with SpO2 and HR WNL.  Pt has limited assistance at home and would not be safe to return to his prior living situation at this time.  Pt will benefit from PT services in a SNF setting upon discharge to safely address above deficits for decreased caregiver assistance and eventual return to PLOF.       Follow Up Recommendations SNF    Equipment Recommendations  Rolling walker with 5" wheels;Other (comment)(TBD at next venue of care upon d/c to SNF)    Recommendations for Other Services       Precautions / Restrictions Precautions Precautions: Fall Restrictions Weight Bearing Restrictions: No      Mobility  Bed Mobility Overal bed mobility: Modified Independent             General bed mobility comments: Extra time required with pt moderately fatigued from the effort of going sup to sit  Transfers Overall transfer level: Needs assistance Equipment used: Rolling walker (2 wheeled) Transfers: Sit to/from Stand Sit to Stand: Min guard;From elevated surface         General transfer comment: Min verbal cues for sequencing  Ambulation/Gait Ambulation/Gait assistance: Min guard Gait Distance (Feet): 10 Feet Assistive device: Rolling walker (2 wheeled) Gait Pattern/deviations: Step-through pattern;Decreased step length - right;Decreased step length - left Gait velocity: decreased   General Gait Details: Slow, effortful steps with moderate lean on the RW for support with RR into the low 30s after amb a max of 10'  Stairs  Wheelchair Mobility    Modified Rankin (Stroke Patients Only)       Balance Overall balance assessment: Needs assistance Sitting-balance support: Feet supported;No upper extremity supported Sitting balance-Leahy Scale: Good       Standing balance-Leahy Scale: Fair Standing balance comment: Mod lean on the RW in standing for support                              Pertinent Vitals/Pain Pain Assessment: 0-10 Pain Score: 3  Pain Location: low back, chronic per patient Pain Descriptors / Indicators: Sore Pain Intervention(s): Monitored during session;Repositioned    Home Living Family/patient expects to be discharged to:: Private residence Living Arrangements: Spouse/significant other Available Help at Discharge: Family;Available 24 hours/day;Other (Comment)(Spouse is somewhat limited physically) Type of Home: House Home Access: Ramped entrance     Home Layout: One level Home Equipment: Grab bars - toilet;Shower seat - built in      Prior Function Level of Independence: Independent         Comments: Ind with amb community distances without an AD, no fall history, Ind with ADLs     Hand Dominance        Extremity/Trunk Assessment   Upper Extremity Assessment Upper Extremity Assessment: Generalized weakness    Lower Extremity Assessment Lower Extremity Assessment: Generalized weakness       Communication   Communication: No difficulties  Cognition Arousal/Alertness: Awake/alert Behavior During Therapy: WFL for tasks assessed/performed Overall Cognitive Status: Within Functional Limits for tasks assessed                                        General Comments      Exercises Total Joint Exercises Ankle Circles/Pumps: Strengthening;AROM;Both;10 reps Quad Sets: Strengthening;Both;10 reps Gluteal Sets: Strengthening;Both;10 reps Hip ABduction/ADduction: AROM;Both;5 reps Long Arc Quad: Strengthening;Both;5 reps;10 reps Knee Flexion: Strengthening;Both;5 reps;10 reps Marching in Standing: AROM;Both;5 reps;Standing Other Exercises Other Exercises: HEP education and review for BLE APs, QS, and GS x 10 every 1-2 hours daily   Assessment/Plan    PT Assessment Patient needs continued PT services  PT Problem List Decreased strength;Decreased activity tolerance;Decreased balance;Decreased  mobility;Decreased knowledge of use of DME       PT Treatment Interventions DME instruction;Gait training;Functional mobility training;Therapeutic activities;Therapeutic exercise;Balance training;Patient/family education    PT Goals (Current goals can be found in the Care Plan section)  Acute Rehab PT Goals Patient Stated Goal: "To get some strength back" PT Goal Formulation: With patient Time For Goal Achievement: 06/14/19 Potential to Achieve Goals: Good    Frequency Min 2X/week   Barriers to discharge Decreased caregiver support Spouse limited physically and would not be able to provide assistance to pt    Co-evaluation               AM-PAC PT "6 Clicks" Mobility  Outcome Measure Help needed turning from your back to your side while in a flat bed without using bedrails?: A Little Help needed moving from lying on your back to sitting on the side of a flat bed without using bedrails?: A Little Help needed moving to and from a bed to a chair (including a wheelchair)?: A Little Help needed standing up from a chair using your arms (e.g., wheelchair or bedside chair)?: A Little Help needed to walk in hospital room?: A Lot Help needed climbing 3-5 steps  with a railing? : Total 6 Click Score: 15    End of Session Equipment Utilized During Treatment: Gait belt Activity Tolerance: Patient limited by fatigue Patient left: in chair;with chair alarm set;with call bell/phone within reach Nurse Communication: Mobility status PT Visit Diagnosis: Difficulty in walking, not elsewhere classified (R26.2);Muscle weakness (generalized) (M62.81)    Time: 9449-6759 PT Time Calculation (min) (ACUTE ONLY): 31 min   Charges:   PT Evaluation $PT Eval Moderate Complexity: 1 Mod PT Treatments $Therapeutic Exercise: 8-22 mins        D. Scott Berneta Sconyers PT, DPT 06/01/19, 3:00 PM

## 2019-06-01 NOTE — Progress Notes (Addendum)
The patient has been transfer to room 231A (negrative pressure room). The patient has received IV Zofran earlier. Still is nauseous. Refused Tylenol at this time. On 2L nasal cannula. Patient on telemetry. The patient Sheryle Spray informed that the patient's daughter will like to be informed when the patient gets transfer to [facility].

## 2019-06-01 NOTE — Progress Notes (Addendum)
Pt blood sugar at 108. Have a schedule 70 units lantus. Page prime and talked to Ouma NO and states to administer 70 units lantus. Will continue to monitor.  Update 2300: Ouma NP place an order for promethazine 12.5 mg IV every 6 hours PRN for nausea and vomiting. Will continue to monitor.

## 2019-06-01 NOTE — Progress Notes (Addendum)
PROGRESS NOTE                                                                                                                                                                                                             Patient Demographics:    Jay Morris, is a 73 y.o. male, DOB - 1945-11-11, ATF:[id number]  Admit date - 05/31/2019   Admitting Physician Christel Mormon, MD  Outpatient Primary MD for the patient is Sparks, Leonie Douglas, MD  LOS - 1    Chief Complaint  Patient presents with   Shortness of Breath       Brief Narrative 73 year old male with history of A. fib, asthma, COPD (not on home O2), type 2 diabetes mellitus, hypertension and dyslipidemia presented to the ED with 2-day history of fever (T-max 103 F) associated with chills, generalized body ache, weakness and malaise.  This was associated with shortness of breath and PND.  Denies any chest pain, cough, wheezing or palpitation.  Denied exposure to COVID-19, sick contact or recent travel.  Reports nausea and watery diarrhea for past 2 days.  Also reported some epigastric discomfort with infraumbilical abdominal pain.  Reports losing almost 10 pounds during the last 4 days. He had a negative flu test done by his PCP and a COVID-19 was sent out. In the ED he was hypertensive with blood pressure 150/66 mmHg, low-grade fever of 90 9.58F and O2 sat dropped to 82% on ambulation on room air.  Patient found to have acute kidney injury with creatinine of 2.32 (baseline 1.3) Chest x-ray showed increased interstitial marking. He was given IV Solu-Medrol,  normal saline bolus, empiric IV Rocephin and azithromycin and hospitalist consulted for admission for further management.    Subjective:   Reports breathing to be minimally improved.  Afebrile.   Assessment  & Plan :   Hospital course Acute respiratory failure with hypoxia) Severe sepsis (HCC) Possibly associated  with COPD with acute exacerbation.  No further diarrhea.  COVID-19 tested negative. Switch IV Solu-Medrol to oral prednisone.  Continue 2 L via nasal cannula and scheduled DuoNeb.  VQ scan negative for PE. Follow blood culture. Continue empiric antibiotics    Active symptoms Acute kidney injury superimposed on chronic kidney disease stage IIIa Possibly prerenal ATN secondary to dehydration with poor p.o. intake, vomiting and diarrhea.  Renal  function improving to baseline with fluids.  Will resume his Lasix.  (Given 1 dose of 40 mg IV, can resume his home dose of 20 mg daily tomorrow if appears volume overloaded)   Nausea, vomiting diarrhea Likely due to his underlying viral illness.  Resolved. CT abdomen pelvis negative for acute findings.  Diabetes mellitus type 2 with hyperglycemia On Tresiba, switch to Lantus while he is here and sliding scale coverage  Paroxysmal A. fib Rate controlled on metoprolol.  Continue Pradaxa.  BPH Continue Flomax   Generalized weakness PT consult   Code Status : Full code  Family Communication  : None  Disposition Plan  : Home possibly in the next 48 hours if improved  Barriers For Discharge : Active symptoms  Consults  : None  Procedures  : CT abdomen pelvis.  VQ scan  DVT Prophylaxis  : Pradaxa  Lab Results  Component Value Date   PLT 156 06/01/2019    Antibiotics  :    Anti-infectives (From admission, onward)   Start     Dose/Rate Route Frequency Ordered Stop   06/01/19 1000  azithromycin (ZITHROMAX) tablet 500 mg     500 mg Oral Daily 05/31/19 1320     05/31/19 0500  azithromycin (ZITHROMAX) 500 mg in sodium chloride 0.9 % 250 mL IVPB     500 mg 250 mL/hr over 60 Minutes Intravenous  Once 05/31/19 0458 05/31/19 0633   05/31/19 0430  cefTRIAXone (ROCEPHIN) 2 g in sodium chloride 0.9 % 100 mL IVPB     2 g 200 mL/hr over 30 Minutes Intravenous  Once 05/31/19 0426 05/31/19 0527        Objective:   Vitals:   06/01/19  0300 06/01/19 0406 06/01/19 0909 06/01/19 1125  BP:  117/60 122/68 (!) 148/68  Pulse:  66 69 72  Resp:  _0 Temp:  97.8 F (36.6 C) 97.6 F (36.4 C) (!) 97.5 F (36.4 C)  TempSrc:  Oral Oral Oral  SpO2: 95% 93% 93% 95%  Weight:      Height:        Wt Readings from Last 3 Encounters:  05/31/19 87.2 kg  12/29/17 87.3 kg  12/02/17 84.5 kg     Intake/Output Summary (Last 24 hours) at 06/01/2019 1207 Last data filed at 06/01/2019 1002 Gross per 24 hour  Intake 1363.41 ml  Output 200 ml  Net 1163.41 ml    Physical exam Elderly male not in distress HEENT: Moist mucosa, supple neck Chest: Fine scattered rhonchi CVS: Normal S1-S2, no murmurs GI: Soft, nontender, nondistended Musculoskeletal: Warm, trace edema     Data Review:    CBC Recent Labs  Lab 05/31/19 0329 06/01/19 0515  WBC 5.3 6.4  HGB 12.5* 11.1*  HCT 37.1* 33.4*  PLT 161 156  MCV 93.9 93.6  MCH 31.6 31.1  MCHC 33.7 33.2  RDW 13.6 13.4  LYMPHSABS 1.2  --   MONOABS 0.5  --   EOSABS 0.0  --   BASOSABS 0.0  --     Chemistries  Recent Labs  Lab 05/31/19 0329 06/01/19 0515  NA 131* 134*  K 4.2 4.3  CL 98 102  CO2 22 22  GLUCOSE 97 235*  BUN 37* 40*  CREATININE 2.32* 1.80*  CALCIUM 7.6* 7.5*  AST 39 30  ALT 26 24  ALKPHOS 30* 24*  BILITOT 1.1 0.6   ------------------------------------------------------------------------------------------------------------------ No results for input(s): CHOL, HDL, LDLCALC, TRIG, CHOLHDL, LDLDIRECT in the last 72 hours.  Lab Results  Component Value Date   HGBA1C 7.5 (H) 05/31/2019   ------------------------------------------------------------------------------------------------------------------ No results for input(s): TSH, T4TOTAL, T3FREE, THYROIDAB in the last 72 hours.  Invalid input(s): FREET3 ------------------------------------------------------------------------------------------------------------------ Recent Labs    05/31/19 0616    FERRITIN 178    Coagulation profile No results for input(s): INR, PROTIME in the last 168 hours.  No results for input(s): DDIMER in the last 72 hours.  Cardiac Enzymes No results for input(s): CKMB, TROPONINI, MYOGLOBIN in the last 168 hours.  Invalid input(s): CK ------------------------------------------------------------------------------------------------------------------    Component Value Date/Time   BNP 37.0 05/31/2019 0616    Inpatient Medications  Scheduled Meds:  allopurinol  100 mg Oral Daily   atorvastatin  20 mg Oral QHS   azithromycin  500 mg Oral Daily   dabigatran  150 mg Oral BID   insulin aspart  0-15 Units Subcutaneous TID WC   insulin aspart  0-5 Units Subcutaneous QHS   insulin glargine  60 Units Subcutaneous QHS   ipratropium-albuterol  3 mL Nebulization Q6H   metoprolol tartrate  25 mg Oral BID   omega-3 acid ethyl esters  4 g Oral Daily   pantoprazole  40 mg Oral Daily   [START ON 06/02/2019] predniSONE  40 mg Oral Q breakfast   tamsulosin  0.4 mg Oral QPM   vitamin B-12  1,000 mcg Oral Daily   Continuous Infusions:  sodium chloride 100 mL/hr at 05/31/19 2314   PRN Meds:.acetaminophen **OR** acetaminophen, HYDROcodone-acetaminophen, magnesium hydroxide, methocarbamol, ondansetron **OR** ondansetron (ZOFRAN) IV, traMADol, traZODone  Micro Results Recent Results (from the past 240 hour(s))  SARS CORONAVIRUS 2 (TAT 6-24 HRS) Nasopharyngeal Nasopharyngeal Swab     Status: None   Collection Time: 05/31/19  5:34 AM   Specimen: Nasopharyngeal Swab  Result Value Ref Range Status   SARS Coronavirus 2 NEGATIVE NEGATIVE Final    Comment: (NOTE) SARS-CoV-2 target nucleic acids are NOT DETECTED. The SARS-CoV-2 RNA is generally detectable in upper and lower respiratory specimens during the acute phase of infection. Negative results do not preclude SARS-CoV-2 infection, do not rule out co-infections with other pathogens, and should not  be used as the sole basis for treatment or other patient management decisions. Negative results must be combined with clinical observations, patient history, and epidemiological information. The expected result is Negative. Fact Sheet for Patients: SugarRoll.be Fact Sheet for Healthcare Providers: https://www.woods-mathews.com/ This test is not yet approved or cleared by the [place] FDA and  has been authorized for detection and/or diagnosis of SARS-CoV-2 by FDA under an Emergency Use Authorization (EUA). This EUA will remain  in effect (meaning this test can be used) for the duration of the COVID-19 declaration under Section 56 4(b)(1) of the Act, 21 U.S.C. section 360bbb-3(b)(1), unless the authorization is terminated or revoked sooner. Performed at [facility], [place] [address], [place], [place] [address]   CULTURE, BLOOD (ROUTINE X 2) w Reflex to ID Panel     Status: None (Preliminary result)   Collection Time: 05/31/19  2:25 PM   Specimen: BLOOD  Result Value Ref Range Status   Specimen Description BLOOD LEFT ANTECUBITAL  Final   Special Requests   Final    BOTTLES DRAWN AEROBIC AND ANAEROBIC Blood Culture adequate volume   Culture   Final    NO GROWTH < 24 HOURS Performed at [facility], [address] [place], [place] [address]    Report Status PENDING  Incomplete  CULTURE, BLOOD (ROUTINE X 2) w  Reflex to ID Panel     Status: None (Preliminary result)   Collection Time: 05/31/19  2:25 PM   Specimen: BLOOD  Result Value Ref Range Status   Specimen Description BLOOD BLOOD LEFT HAND  Final   Special Requests   Final    BOTTLES DRAWN AEROBIC AND ANAEROBIC Blood Culture adequate volume   Culture   Final    NO GROWTH < 24 HOURS Performed at [facility], [address] [place], [place] [address]    Report Status PENDING  Incomplete    Radiology Reports Ct Abdomen Pelvis Wo Contrast  Result  Date: 05/31/2019 CLINICAL DATA:  Weight loss, unintended, non-localized Abdominal pain, nausea vomiting and diarrhea. Shortness of breath. Fevers and chills over the last 2 days. Hypoxia EXAM: CT ABDOMEN AND PELVIS WITHOUT CONTRAST TECHNIQUE: Multidetector CT imaging of the abdomen and pelvis was performed following the standard protocol without IV contrast. COMPARISON:  CT of the abdomen and pelvis 11/21/2017 FINDINGS: Lower chest: Scarring of the lingula and right middle lobe is stable. Minimal dependent atelectasis is present. Heart size is normal. Hepatobiliary: There is diffuse fatty infiltration of the liver. No discrete lesions are present. The common bile duct and gallbladder are normal. Pancreas: Unremarkable. No pancreatic ductal dilatation or surrounding inflammatory changes. Spleen: Normal in size without focal abnormality. Adrenals/Urinary Tract: Adrenal glands are normal bilaterally. Kidneys and ureters are within normal limits. There is no stone or mass lesion. No obstruction is present. The urinary bladder is distended. No focal abnormality is present. Stomach/Bowel: The stomach and duodenum are within normal limits. Small bowel is unremarkable. Terminal ileum is within normal limits. Appendix is visualized and normal. The ascending and transverse colon are normal. The descending and sigmoid colon are within normal limits. Vascular/Lymphatic: Atherosclerotic calcifications are present in the aorta and branch vessels 50% narrowing is present at the origin of the SMA. Moderate stenosis is present at the origin of the IMA. Dense atherosclerotic calcifications are present in the proximal iliac arteries bilaterally. Reproductive: Prostate is unremarkable. Other: No abdominal wall hernia or abnormality. No abdominopelvic ascites. Musculoskeletal: Advanced degenerative changes are present in the lower lumbar spine. No acute abnormalities are present. Bony pelvis is within normal limits. The hips are  located and within normal limits. IMPRESSION: 1. No acute or focal lesion to explain the patient's symptoms. 2. Hepatic steatosis. 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 4. Aortic Atherosclerosis (ICD10-I70.0). Electronically Signed   By: San Morelle M.D.   On: 05/31/2019 07:11   Nm Pulmonary Perf And Vent  Result Date: 06/01/2019 CLINICAL DATA:  Hypoxemia, shortness of breath since Tuesday, COPD, asthma, question pulmonary embolism EXAM: NUCLEAR MEDICINE VENTILATION - PERFUSION LUNG SCAN TECHNIQUE: Ventilation images were obtained in multiple projections using inhaled aerosol Tc-22mDTPA. Perfusion images were obtained in multiple projections after intravenous injection of Tc-935mAA. RADIOPHARMACEUTICALS:  31.98 mCi of Tc-9935mPA aerosol inhalation and 4.12 mCi Tc99m24m IV COMPARISON:  None Correlation: Chest radiograph 05/31/2019 FINDINGS: Ventilation: Overall did irregular diminished ventilation within the upper lobes. Focal ventilatory defect lateral aspect RIGHT upper lobe. Prominent LEFT hilum. Perfusion: Matching perfusion defect lateral RIGHT upper lobe. Remaining perfusion normal. No additional segmental or subsegmental perfusion defects. Chest radiograph: Scattered interstitial prominence in the periphery of the lungs bilaterally greater in RIGHT upper lobe at site of ventilation and perfusion abnormality. IMPRESSION: Low probability for pulmonary embolism. Electronically Signed   By: MarkLavonia Dana  M.D.   On: 06/01/2019 08:21   Dg Chest Portable 1 View  Result Date: 05/31/2019 CLINICAL DATA:  Shortness of breath EXAM: PORTABLE CHEST 1 VIEW COMPARISON:  January 05, 2016 FINDINGS: The heart size and mediastinal contours are within normal limits. Aortic knob calcifications are seen. There is minimally increased interstitial markings seen at the periphery. There is bibasilar subsegmental atelectasis. No large airspace consolidation or pleural  effusion. No acute osseous abnormality. IMPRESSION: Mildly increased interstitial markings which could be due to interstitial edema. Electronically Signed   By: Prudencio Pair M.D.   On: 05/31/2019 03:52    Time Spent in minutes  35   Arham Symmonds M.D on 06/01/2019 at 12:07 PM  Between 7am to 7pm - Pager - [phone]  After 7pm go to [email or web address] - password [facility]  [facility] -  Office  [phone]

## 2019-06-01 NOTE — Discharge Summary (Addendum)
Physician Discharge Summary  Jay Morris LNL:[id number] DOB: May 06, 1946 DOA: 05/31/2019  PCP: [name], MD  Admit date: 05/31/2019 Discharge date: 06/01/2019  Admitted From: home Disposition: transfer to [facility]  Recommendations for Outpatient Follow-up:  Transfer to [facility]   Equipment/Devices: Oxygen (2 L via nasal cannula)  Discharge Condition: Guarded CODE STATUS: Full code Diet recommendation: carb modified    Discharge Diagnoses:  Principal Problem:   COVID-19 virus infection   Active Problems:   Uncontrolled type 2 diabetes mellitus with hyperglycemia, with long-term current use of insulin (HCC)   COPD, moderate ([place])   AKI (acute kidney injury) ([place])   Acute respiratory failure with hypoxia ([place])   Acute renal failure superimposed on stage 3a chronic kidney disease ([place])   COPD with acute exacerbation (HCC)   Muscle weakness (generalized)  Brief narrative/HPI 73 year old male with history of A. fib, asthma, COPD (not on home O2), type 2 diabetes mellitus, hypertension and dyslipidemia presented to the ED with 2-day history of fever (T-max 103 F) associated with chills, generalized body ache, weakness and malaise.  This was associated with shortness of breath and PND.  Denies any chest pain, cough, wheezing or palpitation.  Denied exposure to COVID-19, sick contact or recent travel.  Reports nausea and watery diarrhea for past 2 days.  Also reported some epigastric discomfort with infraumbilical abdominal pain.  Reports losing almost 10 pounds during the last 4 days. He had a negative flu test done by his PCP and a COVID-19 was sent out. In the ED he was hypertensive with blood pressure 150/66 mmHg, low-grade fever of 90 9.37F and O2 sat dropped to 82% on ambulation on room air.  Patient found to have acute kidney injury with creatinine of 2.32 (baseline 1.3) Chest x-ray showed increased interstitial marking. He was given IV  Solu-Medrol,  normal saline bolus, empiric IV Rocephin and azithromycin and hospitalist consulted for admission for further management.  Hospital course   Acute respiratory failure with hypoxia) Severe sepsis (HCC) Combination of COVID-19 infection and COPD exacerbation. Patient was admitted under PUI, tested negative for COVID-19 here on 11/12.  Daughter called this afternoon stating that patient was seen in the PCP office on 11/11 for his symptoms and tested for Covid 19 which was resulted back today as positive.  (I looked up in care everywhere, [facility] and it is tested as positive).  Patient still on 2 L via nasal cannula.  I have ordered scheduled DuoNeb but confirmed that he hadn't received any yet.  IV Solu-Medrol switched to oral prednisone 40 mg twice daily. VQ scan was negative for PE. On empiric antibiotics. Check repeat fibrinigen, ddimer and CRP.   Discussed with ID Dr. Graylon Good who recommended that since patient was tested positive initially and was admitted as PUI he should be treated as COVID-19 positive.  Will transfer patient to [facility] for further management.    Active symptoms Acute kidney injury superimposed on chronic kidney disease stage IIIa Possibly prerenal ATN secondary to dehydration with poor p.o. intake, vomiting and diarrhea.  Renal function improving to baseline with fluids.    Fluids discontinued and Lasix can be resumed in the morning (received 40 mg IV Lasix today).   Nausea, vomiting diarrhea Likely due to his underlying viral illness.  Resolved. CT abdomen pelvis negative for acute findings.  Diabetes mellitus type 2 with hyperglycemia On Tresiba, switch to Lantus while he is here and sliding scale coverage  Paroxysmal  A. fib Rate controlled on metoprolol.  Continue Pradaxa.  BPH Continue Flomax   Generalized weakness PT consult   Family Communication  : Daughter updated  Disposition Plan  :   Transfer to [place]  : None  Procedures  : CT abdomen pelvis.  VQ scan  Discharge Instructions   Allergies as of 06/01/2019      Reactions   Shrimp [shellfish Allergy] Anaphylaxis      Medication List    STOP taking these medications   allopurinol 100 MG tablet Commonly known as: ZYLOPRIM   atorvastatin 20 MG tablet Commonly known as: LIPITOR   Colcrys 0.6 MG tablet Generic drug: colchicine   furosemide 20 MG tablet Commonly known as: LASIX   indomethacin 50 MG capsule Commonly known as: INDOCIN   levofloxacin 750 MG tablet Commonly known as: LEVAQUIN   metFORMIN 1000 MG tablet Commonly known as: GLUCOPHAGE   NovoLOG FlexPen 100 UNIT/ML FlexPen Generic drug: insulin aspart   Tresiba FlexTouch 100 UNIT/ML Sopn FlexTouch Pen Generic drug: insulin degludec     TAKE these medications   Breo Ellipta 100-25 MCG/INH Aepb Generic drug: fluticasone furoate-vilanterol Inhale 1 puff into the lungs daily.   Insulin Pen Needle 31G X 8 MM Misc BD Ultra-Fine Short Pen Needle 31 gauge x 5/16"   metoprolol tartrate 25 MG tablet Commonly known as: LOPRESSOR Take 1 tablet (25 mg total) by mouth 2 (two) times daily. What changed: See the new instructions.   omeprazole 40 MG capsule Commonly known as: PRILOSEC Take 1 capsule (40 mg total) by mouth daily.   ondansetron 4 MG disintegrating tablet Commonly known as: ZOFRAN-ODT Take 1 tablet by mouth every 8 (eight) hours as needed.   Pradaxa 150 MG Caps capsule Generic drug: dabigatran Take 150 mg by mouth 2 (two) times daily.   predniSONE 20 MG tablet Commonly known as: DELTASONE Take 2 tablets (40 mg total) by mouth 2 (two) times daily with a meal.   tamsulosin 0.4 MG Caps capsule Commonly known as: FLOMAX Take 0.4 mg by mouth every evening.   vitamin B-12 1000 MCG tablet Commonly known as: CYANOCOBALAMIN Take 1 tablet (1,000 mcg total) by mouth daily.       Allergies   Allergen Reactions  . Shrimp [Shellfish Allergy] Anaphylaxis        Procedures/Studies: Ct Abdomen Pelvis Wo Contrast  Result Date: 05/31/2019 CLINICAL DATA:  Weight loss, unintended, non-localized Abdominal pain, nausea vomiting and diarrhea. Shortness of breath. Fevers and chills over the last 2 days. Hypoxia EXAM: CT ABDOMEN AND PELVIS WITHOUT CONTRAST TECHNIQUE: Multidetector CT imaging of the abdomen and pelvis was performed following the standard protocol without IV contrast. COMPARISON:  CT of the abdomen and pelvis 11/21/2017 FINDINGS: Lower chest: Scarring of the lingula and right middle lobe is stable. Minimal dependent atelectasis is present. Heart size is normal. Hepatobiliary: There is diffuse fatty infiltration of the liver. No discrete lesions are present. The common bile duct and gallbladder are normal. Pancreas: Unremarkable. No pancreatic ductal dilatation or surrounding inflammatory changes. Spleen: Normal in size without focal abnormality. Adrenals/Urinary Tract: Adrenal glands are normal bilaterally. Kidneys and ureters are within normal limits. There is no stone or mass lesion. No obstruction is present. The urinary bladder is distended. No focal abnormality is present. Stomach/Bowel: The stomach and duodenum are within normal limits. Small bowel is unremarkable. Terminal ileum is within normal limits. Appendix is visualized and normal. The ascending and transverse colon are normal.  The descending and sigmoid colon are within normal limits. Vascular/Lymphatic: Atherosclerotic calcifications are present in the aorta and branch vessels 50% narrowing is present at the origin of the SMA. Moderate stenosis is present at the origin of the IMA. Dense atherosclerotic calcifications are present in the proximal iliac arteries bilaterally. Reproductive: Prostate is unremarkable. Other: No abdominal wall hernia or abnormality. No abdominopelvic ascites. Musculoskeletal: Advanced degenerative  changes are present in the lower lumbar spine. No acute abnormalities are present. Bony pelvis is within normal limits. The hips are located and within normal limits. IMPRESSION: 1. No acute or focal lesion to explain the patient's symptoms. 2. Hepatic steatosis. 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 4. Aortic Atherosclerosis (ICD10-I70.0). Electronically Signed   By: San Morelle M.D.   On: 05/31/2019 07:11   Nm Pulmonary Perf And Vent  Result Date: 06/01/2019 CLINICAL DATA:  Hypoxemia, shortness of breath since Tuesday, COPD, asthma, question pulmonary embolism EXAM: NUCLEAR MEDICINE VENTILATION - PERFUSION LUNG SCAN TECHNIQUE: Ventilation images were obtained in multiple projections using inhaled aerosol Tc-40mDTPA. Perfusion images were obtained in multiple projections after intravenous injection of Tc-9106mAA. RADIOPHARMACEUTICALS:  31.98 mCi of Tc-9925mPA aerosol inhalation and 4.12 mCi Tc99m63m IV COMPARISON:  None Correlation: Chest radiograph 05/31/2019 FINDINGS: Ventilation: Overall did irregular diminished ventilation within the upper lobes. Focal ventilatory defect lateral aspect RIGHT upper lobe. Prominent LEFT hilum. Perfusion: Matching perfusion defect lateral RIGHT upper lobe. Remaining perfusion normal. No additional segmental or subsegmental perfusion defects. Chest radiograph: Scattered interstitial prominence in the periphery of the lungs bilaterally greater in RIGHT upper lobe at site of ventilation and perfusion abnormality. IMPRESSION: Low probability for pulmonary embolism. Electronically Signed   By: MarkLavonia Dana.   On: 06/01/2019 08:21   Dg Chest Portable 1 View  Result Date: 05/31/2019 CLINICAL DATA:  Shortness of breath EXAM: PORTABLE CHEST 1 VIEW COMPARISON:  January 05, 2016 FINDINGS: The heart size and mediastinal contours are within normal limits. Aortic knob calcifications are seen. There is minimally increased  interstitial markings seen at the periphery. There is bibasilar subsegmental atelectasis. No large airspace consolidation or pleural effusion. No acute osseous abnormality. IMPRESSION: Mildly increased interstitial markings which could be due to interstitial edema. Electronically Signed   By: BindPrudencio Pair.   On: 05/31/2019 03:52    (Echo, Carotid, EGD, Colonoscopy, ERCP)    Subjective:   Discharge Exam: Vitals:   06/01/19 1425 06/01/19 1643  BP:    Pulse:    Resp:    Temp:  98.3 F (36.8 C)  SpO2: 94%    Vitals:   06/01/19 1125 06/01/19 1215 06/01/19 1425 06/01/19 1643  BP: (!) 148/68 132/64    Pulse: 72 71    Resp: 20 (!) 24    Temp: (!) 97.5 F (36.4 C) 97.6 F (36.4 C)  98.3 F (36.8 C)  TempSrc: Oral Oral  Oral  SpO2: 95% 96% 94%   Weight:      Height:        General: Elderly male, appears fatigued, not in acute distress HEENT: Moist mucosa, supple neck Chest: Scattered wheezing bilaterally CVs: Normal S1-S2, no murmurs rub or gallop GI: Soft, nondistended, nontender, bowel sounds present Musculoskeletal: Warm, no edema     The results of significant diagnostics from this hospitalization (including imaging, microbiology, ancillary and laboratory) are listed below for reference.     Microbiology: Recent Results (from the past  240 hour(s))  SARS CORONAVIRUS 2 (TAT 6-24 HRS) Nasopharyngeal Nasopharyngeal Swab     Status: None   Collection Time: 05/31/19  5:34 AM   Specimen: Nasopharyngeal Swab  Result Value Ref Range Status   SARS Coronavirus 2 NEGATIVE NEGATIVE Final    Comment: (NOTE) SARS-CoV-2 target nucleic acids are NOT DETECTED. The SARS-CoV-2 RNA is generally detectable in upper and lower respiratory specimens during the acute phase of infection. Negative results do not preclude SARS-CoV-2 infection, do not rule out co-infections with other pathogens, and should not be used as the sole basis for treatment or other patient management  decisions. Negative results must be combined with clinical observations, patient history, and epidemiological information. The expected result is Negative. Fact Sheet for Patients: SugarRoll.be Fact Sheet for Healthcare Providers: https://www.woods-mathews.com/ This test is not yet approved or cleared by the [place] FDA and  has been authorized for detection and/or diagnosis of SARS-CoV-2 by FDA under an Emergency Use Authorization (EUA). This EUA will remain  in effect (meaning this test can be used) for the duration of the COVID-19 declaration under Section 56 4(b)(1) of the Act, 21 U.S.C. section 360bbb-3(b)(1), unless the authorization is terminated or revoked sooner. Performed at [facility], [place] [address] [place], [place] [address]   CULTURE, BLOOD (ROUTINE X 2) w Reflex to ID Panel     Status: None (Preliminary result)   Collection Time: 05/31/19  2:25 PM   Specimen: BLOOD  Result Value Ref Range Status   Specimen Description BLOOD LEFT ANTECUBITAL  Final   Special Requests   Final    BOTTLES DRAWN AEROBIC AND ANAEROBIC Blood Culture adequate volume   Culture   Final    NO GROWTH < 24 HOURS Performed at [facility], [address], [place], [place] [address]    Report Status PENDING  Incomplete  CULTURE, BLOOD (ROUTINE X 2) w Reflex to ID Panel     Status: None (Preliminary result)   Collection Time: 05/31/19  2:25 PM   Specimen: BLOOD  Result Value Ref Range Status   Specimen Description BLOOD BLOOD LEFT HAND  Final   Special Requests   Final    BOTTLES DRAWN AEROBIC AND ANAEROBIC Blood Culture adequate volume   Culture   Final    NO GROWTH < 24 HOURS Performed at [facility], [address] [place], [place] [address]    Report Status PENDING  Incomplete     Labs: BNP (last 3 results) Recent Labs    05/31/19 0616  BNP 67.3   Basic Metabolic Panel: Recent Labs  Lab 05/31/19 0329  06/01/19 0515  NA 131* 134*  K 4.2 4.3  CL 98 102  CO2 22 22  GLUCOSE 97 235*  BUN 37* 40*  CREATININE 2.32* 1.80*  CALCIUM 7.6* 7.5*   Liver Function Tests: Recent Labs  Lab 05/31/19 0329 06/01/19 0515  AST 39 30  ALT 26 24  ALKPHOS 30* 24*  BILITOT 1.1 0.6  PROT 7.3 6.3*  ALBUMIN 3.8 3.2*   No results for input(s): LIPASE, AMYLASE in the last 168 hours. No results for input(s): AMMONIA in the last 168 hours. CBC: Recent Labs  Lab 05/31/19 0329 06/01/19 0515  WBC 5.3 6.4  NEUTROABS 3.5  --   HGB 12.5* 11.1*  HCT 37.1* 33.4*  MCV 93.9 93.6  PLT 161 156   Cardiac Enzymes: No results for input(s): CKTOTAL, CKMB, CKMBINDEX, TROPONINI in the last 168 hours. BNP: Invalid input(s):  POCBNP CBG: Recent Labs  Lab 05/31/19 1804 05/31/19 2132 06/01/19 0817 06/01/19 1126 06/01/19 1639  GLUCAP 384* 313* 221* 236* 201*   D-Dimer No results for input(s): DDIMER in the last 72 hours. Hgb A1c Recent Labs    05/31/19 1525  HGBA1C 7.5*   Lipid Profile No results for input(s): CHOL, HDL, LDLCALC, TRIG, CHOLHDL, LDLDIRECT in the last 72 hours. Thyroid function studies No results for input(s): TSH, T4TOTAL, T3FREE, THYROIDAB in the last 72 hours.  Invalid input(s): FREET3 Anemia work up Recent Labs    05/31/19 0616  FERRITIN 178   Urinalysis    Component Value Date/Time   COLORURINE YELLOW (A) 01/05/2016 0408   APPEARANCEUR CLEAR (A) 01/05/2016 0408   LABSPEC 1.020 01/05/2016 0408   PHURINE 5.0 01/05/2016 0408   GLUCOSEU 50 (A) 01/05/2016 0408   HGBUR NEGATIVE 01/05/2016 0408   BILIRUBINUR NEGATIVE 01/05/2016 0408   KETONESUR NEGATIVE 01/05/2016 0408   PROTEINUR 100 (A) 01/05/2016 0408   NITRITE NEGATIVE 01/05/2016 0408   LEUKOCYTESUR NEGATIVE 01/05/2016 0408   Sepsis Labs Invalid input(s): PROCALCITONIN,  WBC,  LACTICIDVEN Microbiology Recent Results (from the past 240 hour(s))  SARS CORONAVIRUS 2 (TAT 6-24 HRS) Nasopharyngeal Nasopharyngeal Swab      Status: None   Collection Time: 05/31/19  5:34 AM   Specimen: Nasopharyngeal Swab  Result Value Ref Range Status   SARS Coronavirus 2 NEGATIVE NEGATIVE Final    Comment: (NOTE) SARS-CoV-2 target nucleic acids are NOT DETECTED. The SARS-CoV-2 RNA is generally detectable in upper and lower respiratory specimens during the acute phase of infection. Negative results do not preclude SARS-CoV-2 infection, do not rule out co-infections with other pathogens, and should not be used as the sole basis for treatment or other patient management decisions. Negative results must be combined with clinical observations, patient history, and epidemiological information. The expected result is Negative. Fact Sheet for Patients: SugarRoll.be Fact Sheet for Healthcare Providers: https://www.woods-mathews.com/ This test is not yet approved or cleared by the [place] FDA and  has been authorized for detection and/or diagnosis of SARS-CoV-2 by FDA under an Emergency Use Authorization (EUA). This EUA will remain  in effect (meaning this test can be used) for the duration of the COVID-19 declaration under Section 56 4(b)(1) of the Act, 21 U.S.C. section 360bbb-3(b)(1), unless the authorization is terminated or revoked sooner. Performed at [facility], [place] [address] [place], [place] [address]   CULTURE, BLOOD (ROUTINE X 2) w Reflex to ID Panel     Status: None (Preliminary result)   Collection Time: 05/31/19  2:25 PM   Specimen: BLOOD  Result Value Ref Range Status   Specimen Description BLOOD LEFT ANTECUBITAL  Final   Special Requests   Final    BOTTLES DRAWN AEROBIC AND ANAEROBIC Blood Culture adequate volume   Culture   Final    NO GROWTH < 24 HOURS Performed at [facility], [address] [place], [place] [address]    Report Status PENDING  Incomplete  CULTURE, BLOOD (ROUTINE X 2) w Reflex to ID Panel     Status: None (Preliminary  result)   Collection Time: 05/31/19  2:25 PM   Specimen: BLOOD  Result Value Ref Range Status   Specimen Description BLOOD BLOOD LEFT HAND  Final   Special Requests   Final    BOTTLES DRAWN AEROBIC AND ANAEROBIC Blood Culture adequate volume   Culture   Final    NO GROWTH < 24 HOURS Performed at [facility], [address] [place], [place] [address]    Report Status PENDING  Incomplete     Time coordinating discharge: 35 minutes  SIGNED:   Louellen Molder, MD  [facility] 06/01/2019, 5:51 PM Pager   If 7PM-7AM, please contact night-coverage [email or web address] Password [email or web address]

## 2019-06-01 NOTE — Progress Notes (Signed)
MD notified: The patient's daughter sherrin called she indicates that the patient was tested for covid two days ago before being admitted to the hospital. He was tested for covid here and result was negative. The result for the PCP came back positive. Do you want to treat him as a positive case.  After speaking with Dr. Clementeen Graham The patient will need to be treated as covid positive. There are no beds available in the floor for the patient to be transfer. MD has placed the order to transfer the patient to 2A to a negative pressure room.

## 2019-06-01 NOTE — Progress Notes (Signed)
Remdesivir - Pharmacy Brief Note   O:  ALT: 24  CXR: possible interstitial edema SpO2: 93-96% on 2 L O2 (no O2 requirement PTA)   A/P:  Remdesivir 200 mg IVPB once followed by 100 mg IVPB daily x 4 days.   Dorena Bodo, PharmD 06/01/2019 6:29 PM

## 2019-06-01 NOTE — Plan of Care (Signed)
  Problem: Education: Goal: Knowledge of General Education information will improve Description: Including pain rating scale, medication(s)/side effects and non-pharmacologic comfort measures Outcome: Progressing   Problem: Clinical Measurements: Goal: Respiratory complications will improve Outcome: Progressing

## 2019-06-02 ENCOUNTER — Encounter (HOSPITAL_COMMUNITY): Payer: Self-pay

## 2019-06-02 ENCOUNTER — Other Ambulatory Visit: Payer: Self-pay

## 2019-06-02 ENCOUNTER — Inpatient Hospital Stay (HOSPITAL_COMMUNITY)
Admission: AD | Admit: 2019-06-02 | Discharge: 2019-06-25 | DRG: 871 | Disposition: A | Discharge status: Home Health | Payer: Medicare HMO | Source: Other Acute Inpatient Hospital | Attending: Internal Medicine | Admitting: Internal Medicine

## 2019-06-02 DIAGNOSIS — Z794 Long term (current) use of insulin: Secondary | ICD-10-CM

## 2019-06-02 DIAGNOSIS — N1831 Chronic kidney disease, stage 3a: Secondary | ICD-10-CM

## 2019-06-02 DIAGNOSIS — I4892 Unspecified atrial flutter: Secondary | ICD-10-CM | POA: Diagnosis not present

## 2019-06-02 DIAGNOSIS — U071 COVID-19: Secondary | ICD-10-CM | POA: Diagnosis not present

## 2019-06-02 DIAGNOSIS — M51369 Other intervertebral disc degeneration, lumbar region without mention of lumbar back pain or lower extremity pain: Secondary | ICD-10-CM | POA: Diagnosis present

## 2019-06-02 DIAGNOSIS — N179 Acute kidney failure, unspecified: Secondary | ICD-10-CM | POA: Diagnosis not present

## 2019-06-02 DIAGNOSIS — I484 Atypical atrial flutter: Secondary | ICD-10-CM | POA: Diagnosis not present

## 2019-06-02 DIAGNOSIS — R001 Bradycardia, unspecified: Secondary | ICD-10-CM | POA: Diagnosis not present

## 2019-06-02 DIAGNOSIS — J440 Chronic obstructive pulmonary disease with acute lower respiratory infection: Secondary | ICD-10-CM | POA: Diagnosis not present

## 2019-06-02 DIAGNOSIS — I951 Orthostatic hypotension: Secondary | ICD-10-CM | POA: Diagnosis present

## 2019-06-02 DIAGNOSIS — D696 Thrombocytopenia, unspecified: Secondary | ICD-10-CM | POA: Diagnosis not present

## 2019-06-02 DIAGNOSIS — Z8249 Family history of ischemic heart disease and other diseases of the circulatory system: Secondary | ICD-10-CM

## 2019-06-02 DIAGNOSIS — I5033 Acute on chronic diastolic (congestive) heart failure: Secondary | ICD-10-CM | POA: Diagnosis present

## 2019-06-02 DIAGNOSIS — R6520 Severe sepsis without septic shock: Secondary | ICD-10-CM | POA: Diagnosis present

## 2019-06-02 DIAGNOSIS — E785 Hyperlipidemia, unspecified: Secondary | ICD-10-CM | POA: Diagnosis present

## 2019-06-02 DIAGNOSIS — E1165 Type 2 diabetes mellitus with hyperglycemia: Secondary | ICD-10-CM | POA: Diagnosis present

## 2019-06-02 DIAGNOSIS — R338 Other retention of urine: Secondary | ICD-10-CM | POA: Diagnosis present

## 2019-06-02 DIAGNOSIS — R0602 Shortness of breath: Secondary | ICD-10-CM

## 2019-06-02 DIAGNOSIS — R339 Retention of urine, unspecified: Secondary | ICD-10-CM | POA: Diagnosis not present

## 2019-06-02 DIAGNOSIS — J1282 Pneumonia due to coronavirus disease 2019: Secondary | ICD-10-CM

## 2019-06-02 DIAGNOSIS — K760 Fatty (change of) liver, not elsewhere classified: Secondary | ICD-10-CM | POA: Diagnosis present

## 2019-06-02 DIAGNOSIS — I4891 Unspecified atrial fibrillation: Secondary | ICD-10-CM | POA: Diagnosis not present

## 2019-06-02 DIAGNOSIS — J9601 Acute respiratory failure with hypoxia: Secondary | ICD-10-CM

## 2019-06-02 DIAGNOSIS — Z91013 Allergy to seafood: Secondary | ICD-10-CM

## 2019-06-02 DIAGNOSIS — Z79899 Other long term (current) drug therapy: Secondary | ICD-10-CM

## 2019-06-02 DIAGNOSIS — I10 Essential (primary) hypertension: Secondary | ICD-10-CM | POA: Diagnosis not present

## 2019-06-02 DIAGNOSIS — K5900 Constipation, unspecified: Secondary | ICD-10-CM

## 2019-06-02 DIAGNOSIS — I130 Hypertensive heart and chronic kidney disease with heart failure and stage 1 through stage 4 chronic kidney disease, or unspecified chronic kidney disease: Secondary | ICD-10-CM | POA: Diagnosis not present

## 2019-06-02 DIAGNOSIS — I480 Paroxysmal atrial fibrillation: Secondary | ICD-10-CM

## 2019-06-02 DIAGNOSIS — I739 Peripheral vascular disease, unspecified: Secondary | ICD-10-CM | POA: Diagnosis not present

## 2019-06-02 DIAGNOSIS — J1289 Other viral pneumonia: Secondary | ICD-10-CM | POA: Diagnosis not present

## 2019-06-02 DIAGNOSIS — J441 Chronic obstructive pulmonary disease with (acute) exacerbation: Secondary | ICD-10-CM

## 2019-06-02 DIAGNOSIS — J449 Chronic obstructive pulmonary disease, unspecified: Secondary | ICD-10-CM | POA: Diagnosis present

## 2019-06-02 DIAGNOSIS — G8929 Other chronic pain: Secondary | ICD-10-CM | POA: Diagnosis present

## 2019-06-02 DIAGNOSIS — R0600 Dyspnea, unspecified: Secondary | ICD-10-CM

## 2019-06-02 DIAGNOSIS — K5641 Fecal impaction: Secondary | ICD-10-CM | POA: Diagnosis not present

## 2019-06-02 DIAGNOSIS — E11649 Type 2 diabetes mellitus with hypoglycemia without coma: Secondary | ICD-10-CM | POA: Diagnosis not present

## 2019-06-02 DIAGNOSIS — A4189 Other specified sepsis: Principal | ICD-10-CM | POA: Diagnosis present

## 2019-06-02 DIAGNOSIS — A419 Sepsis, unspecified organism: Secondary | ICD-10-CM | POA: Diagnosis not present

## 2019-06-02 DIAGNOSIS — N401 Enlarged prostate with lower urinary tract symptoms: Secondary | ICD-10-CM | POA: Diagnosis present

## 2019-06-02 DIAGNOSIS — E1122 Type 2 diabetes mellitus with diabetic chronic kidney disease: Secondary | ICD-10-CM | POA: Diagnosis present

## 2019-06-02 DIAGNOSIS — M5136 Other intervertebral disc degeneration, lumbar region: Secondary | ICD-10-CM | POA: Diagnosis present

## 2019-06-02 DIAGNOSIS — I071 Rheumatic tricuspid insufficiency: Secondary | ICD-10-CM | POA: Diagnosis present

## 2019-06-02 DIAGNOSIS — E1151 Type 2 diabetes mellitus with diabetic peripheral angiopathy without gangrene: Secondary | ICD-10-CM | POA: Diagnosis present

## 2019-06-02 DIAGNOSIS — Z87891 Personal history of nicotine dependence: Secondary | ICD-10-CM

## 2019-06-02 DIAGNOSIS — R1110 Vomiting, unspecified: Secondary | ICD-10-CM

## 2019-06-02 HISTORY — DX: Pneumonia due to coronavirus disease 2019: J12.82

## 2019-06-02 HISTORY — DX: COVID-19: U07.1

## 2019-06-02 LAB — COMPREHENSIVE METABOLIC PANEL
ALT: 27 U/L (ref 0–44)
AST: 45 U/L — ABNORMAL HIGH (ref 15–41)
Albumin: 3.5 g/dL (ref 3.5–5.0)
Alkaline Phosphatase: 25 U/L — ABNORMAL LOW (ref 38–126)
Anion gap: 11 (ref 5–15)
BUN: 42 mg/dL — ABNORMAL HIGH (ref 8–23)
CO2: 24 mmol/L (ref 22–32)
Calcium: 8.3 mg/dL — ABNORMAL LOW (ref 8.9–10.3)
Chloride: 99 mmol/L (ref 98–111)
Creatinine, Ser: 1.56 mg/dL — ABNORMAL HIGH (ref 0.61–1.24)
GFR calc Af Amer: 50 mL/min — ABNORMAL LOW (ref >60)
GFR calc non Af Amer: 43 mL/min — ABNORMAL LOW (ref >60)
Glucose, Bld: 151 mg/dL — ABNORMAL HIGH (ref 70–99)
Potassium: 4.7 mmol/L (ref 3.5–5.1)
Sodium: 134 mmol/L — ABNORMAL LOW (ref 135–145)
Total Bilirubin: 0.7 mg/dL (ref 0.3–1.2)
Total Protein: 7 g/dL (ref 6.5–8.1)

## 2019-06-02 LAB — ABO/RH: ABO/RH(D): A POS

## 2019-06-02 LAB — CBC WITH DIFFERENTIAL/PLATELET
Abs Immature Granulocytes: 0.03 10*3/uL (ref 0.00–0.07)
Basophils Absolute: 0 10*3/uL (ref 0.0–0.1)
Basophils Relative: 0 %
Eosinophils Absolute: 0 10*3/uL (ref 0.0–0.5)
Eosinophils Relative: 0 %
HCT: 38.7 % — ABNORMAL LOW (ref 39.0–52.0)
Hemoglobin: 12.7 g/dL — ABNORMAL LOW (ref 13.0–17.0)
Immature Granulocytes: 0 %
Lymphocytes Relative: 6 %
Lymphs Abs: 0.6 10*3/uL — ABNORMAL LOW (ref 0.7–4.0)
MCH: 31.7 pg (ref 26.0–34.0)
MCHC: 32.8 g/dL (ref 30.0–36.0)
MCV: 96.5 fL (ref 80.0–100.0)
Monocytes Absolute: 0.3 10*3/uL (ref 0.1–1.0)
Monocytes Relative: 4 %
Neutro Abs: 8.2 10*3/uL — ABNORMAL HIGH (ref 1.7–7.7)
Neutrophils Relative %: 90 %
Platelets: 163 10*3/uL (ref 150–400)
RBC: 4.01 MIL/uL — ABNORMAL LOW (ref 4.22–5.81)
RDW: 13.7 % (ref 11.5–15.5)
WBC: 9.1 10*3/uL (ref 4.0–10.5)
nRBC: 0 % (ref 0.0–0.2)

## 2019-06-02 LAB — D-DIMER, QUANTITATIVE: D-Dimer, Quant: 0.52 ug/mL-FEU — ABNORMAL HIGH (ref 0.00–0.50)

## 2019-06-02 LAB — GLUCOSE, CAPILLARY
Glucose-Capillary: 47 mg/dL — ABNORMAL LOW (ref 70–99)
Glucose-Capillary: 77 mg/dL (ref 70–99)
Glucose-Capillary: 141 mg/dL — ABNORMAL HIGH (ref 70–99)
Glucose-Capillary: 108 mg/dL — ABNORMAL HIGH (ref 70–99)
Glucose-Capillary: 149 mg/dL — ABNORMAL HIGH (ref 70–99)
Glucose-Capillary: 128 mg/dL — ABNORMAL HIGH (ref 70–99)

## 2019-06-02 LAB — C-REACTIVE PROTEIN: CRP: 6.2 mg/dL — ABNORMAL HIGH (ref <1.0)

## 2019-06-02 MED ORDER — METOPROLOL TARTRATE 25 MG PO TABS
50.0000 mg | ORAL_TABLET | Freq: Two times a day (BID) | ORAL | Status: DC
  Administered 2019-06-02 – 2019-06-03 (×2): 50 mg via ORAL
  Filled 2019-06-02 (×2): qty 2

## 2019-06-02 MED ORDER — ATORVASTATIN CALCIUM 10 MG PO TABS
20.0000 mg | ORAL_TABLET | Freq: Every day | ORAL | Status: DC
  Administered 2019-06-02 – 2019-06-25 (×24): 20 mg via ORAL
  Filled 2019-06-02 (×24): qty 2

## 2019-06-02 MED ORDER — ONDANSETRON HCL 4 MG/2ML IJ SOLN
4.0000 mg | Freq: Four times a day (QID) | INTRAMUSCULAR | Status: DC | PRN
  Administered 2019-06-09 – 2019-06-15 (×8): 4 mg via INTRAVENOUS
  Filled 2019-06-02 (×8): qty 2

## 2019-06-02 MED ORDER — METOPROLOL TARTRATE 5 MG/5ML IV SOLN
5.0000 mg | INTRAVENOUS | Status: DC | PRN
  Administered 2019-06-02 – 2019-06-11 (×4): 5 mg via INTRAVENOUS
  Filled 2019-06-02 (×5): qty 5

## 2019-06-02 MED ORDER — DEXTROSE 50 % IV SOLN
INTRAVENOUS | Status: AC
  Filled 2019-06-02: qty 50

## 2019-06-02 MED ORDER — GUAIFENESIN-DM 100-10 MG/5ML PO SYRP
10.0000 mL | ORAL_SOLUTION | ORAL | Status: DC | PRN
  Administered 2019-06-02 – 2019-06-06 (×3): 10 mL via ORAL
  Filled 2019-06-02 (×3): qty 10

## 2019-06-02 MED ORDER — METOPROLOL TARTRATE 25 MG PO TABS
25.0000 mg | ORAL_TABLET | Freq: Two times a day (BID) | ORAL | Status: DC
  Administered 2019-06-02: 25 mg via ORAL
  Filled 2019-06-02 (×2): qty 1

## 2019-06-02 MED ORDER — HYDROCOD POLST-CPM POLST ER 10-8 MG/5ML PO SUER
5.0000 mL | Freq: Two times a day (BID) | ORAL | Status: DC | PRN
  Administered 2019-06-07 – 2019-06-14 (×3): 5 mL via ORAL
  Filled 2019-06-02 (×3): qty 5

## 2019-06-02 MED ORDER — METOPROLOL TARTRATE 25 MG PO TABS
25.0000 mg | ORAL_TABLET | Freq: Once | ORAL | Status: AC
  Administered 2019-06-02: 25 mg via ORAL

## 2019-06-02 MED ORDER — PANTOPRAZOLE SODIUM 40 MG PO TBEC
40.0000 mg | DELAYED_RELEASE_TABLET | Freq: Every day | ORAL | Status: DC
  Administered 2019-06-02 – 2019-06-14 (×12): 40 mg via ORAL
  Filled 2019-06-02 (×12): qty 1

## 2019-06-02 MED ORDER — INSULIN GLARGINE 100 UNIT/ML ~~LOC~~ SOLN
70.0000 [IU] | Freq: Every day | SUBCUTANEOUS | Status: DC
  Administered 2019-06-02: 70 [IU] via SUBCUTANEOUS
  Filled 2019-06-02 (×2): qty 0.7

## 2019-06-02 MED ORDER — ONDANSETRON HCL 4 MG PO TABS: 4.0000 mg | ORAL_TABLET | Freq: Four times a day (QID) | ORAL | Status: DC | PRN

## 2019-06-02 MED ORDER — ALLOPURINOL 100 MG PO TABS
100.0000 mg | ORAL_TABLET | Freq: Every day | ORAL | Status: DC
  Administered 2019-06-02 – 2019-06-25 (×24): 100 mg via ORAL
  Filled 2019-06-02 (×24): qty 1

## 2019-06-02 MED ORDER — SODIUM CHLORIDE 0.9 % IV SOLN
100.0000 mg | INTRAVENOUS | Status: AC
  Administered 2019-06-03 – 2019-06-05 (×3): 100 mg via INTRAVENOUS
  Filled 2019-06-02 (×3): qty 100

## 2019-06-02 MED ORDER — INSULIN ASPART 100 UNIT/ML ~~LOC~~ SOLN
0.0000 [IU] | Freq: Every day | SUBCUTANEOUS | Status: DC
  Administered 2019-06-03 – 2019-06-04 (×2): 2 [IU] via SUBCUTANEOUS
  Administered 2019-06-05: 4 [IU] via SUBCUTANEOUS
  Administered 2019-06-06: 5 [IU] via SUBCUTANEOUS
  Administered 2019-06-07 – 2019-06-09 (×3): 3 [IU] via SUBCUTANEOUS
  Administered 2019-06-11 – 2019-06-13 (×3): 5 [IU] via SUBCUTANEOUS
  Administered 2019-06-14: 2 [IU] via SUBCUTANEOUS
  Administered 2019-06-17: 5 [IU] via SUBCUTANEOUS

## 2019-06-02 MED ORDER — TAMSULOSIN HCL 0.4 MG PO CAPS
0.4000 mg | ORAL_CAPSULE | Freq: Every day | ORAL | Status: DC
  Administered 2019-06-02 – 2019-06-14 (×13): 0.4 mg via ORAL
  Filled 2019-06-02 (×13): qty 1

## 2019-06-02 MED ORDER — DABIGATRAN ETEXILATE MESYLATE 150 MG PO CAPS
150.0000 mg | ORAL_CAPSULE | Freq: Two times a day (BID) | ORAL | Status: DC
  Administered 2019-06-02 – 2019-06-23 (×43): 150 mg via ORAL
  Filled 2019-06-02 (×49): qty 1

## 2019-06-02 MED ORDER — FUROSEMIDE 20 MG PO TABS
20.0000 mg | ORAL_TABLET | Freq: Every day | ORAL | Status: DC
  Administered 2019-06-02 – 2019-06-15 (×14): 20 mg via ORAL
  Filled 2019-06-02 (×14): qty 1

## 2019-06-02 MED ORDER — INSULIN ASPART 100 UNIT/ML ~~LOC~~ SOLN
0.0000 [IU] | Freq: Three times a day (TID) | SUBCUTANEOUS | Status: DC
  Administered 2019-06-02 (×2): 2 [IU] via SUBCUTANEOUS
  Administered 2019-06-03: 8 [IU] via SUBCUTANEOUS
  Administered 2019-06-04: 2 [IU] via SUBCUTANEOUS
  Administered 2019-06-04: 8 [IU] via SUBCUTANEOUS
  Administered 2019-06-05: 11 [IU] via SUBCUTANEOUS
  Administered 2019-06-05: 2 [IU] via SUBCUTANEOUS
  Administered 2019-06-05: 5 [IU] via SUBCUTANEOUS
  Administered 2019-06-06: 11 [IU] via SUBCUTANEOUS
  Administered 2019-06-06: 5 [IU] via SUBCUTANEOUS
  Administered 2019-06-06: 11 [IU] via SUBCUTANEOUS
  Administered 2019-06-07 (×2): 8 [IU] via SUBCUTANEOUS
  Administered 2019-06-07: 5 [IU] via SUBCUTANEOUS
  Administered 2019-06-08 (×2): 3 [IU] via SUBCUTANEOUS
  Administered 2019-06-08: 5 [IU] via SUBCUTANEOUS
  Administered 2019-06-09 (×2): 8 [IU] via SUBCUTANEOUS
  Administered 2019-06-09: 3 [IU] via SUBCUTANEOUS
  Administered 2019-06-10: 8 [IU] via SUBCUTANEOUS
  Administered 2019-06-10: 3 [IU] via SUBCUTANEOUS
  Administered 2019-06-10: 11 [IU] via SUBCUTANEOUS
  Administered 2019-06-11: 15 [IU] via SUBCUTANEOUS
  Administered 2019-06-11: 2 [IU] via SUBCUTANEOUS
  Administered 2019-06-12: 15 [IU] via SUBCUTANEOUS
  Administered 2019-06-12: 11 [IU] via SUBCUTANEOUS
  Administered 2019-06-12: 3 [IU] via SUBCUTANEOUS

## 2019-06-02 MED ORDER — ACETAMINOPHEN 325 MG PO TABS
650.0000 mg | ORAL_TABLET | Freq: Four times a day (QID) | ORAL | Status: DC | PRN
  Administered 2019-06-03 – 2019-06-24 (×20): 650 mg via ORAL
  Filled 2019-06-02 (×21): qty 2

## 2019-06-02 MED ORDER — SODIUM CHLORIDE 0.9 % IV SOLN
100.0000 mg | INTRAVENOUS | Status: AC
  Administered 2019-06-02: 100 mg via INTRAVENOUS
  Filled 2019-06-02: qty 100

## 2019-06-02 MED ORDER — SODIUM CHLORIDE 0.9 % IV SOLN
INTRAVENOUS | Status: DC | PRN
  Administered 2019-06-02 – 2019-06-03 (×2): 250 mL via INTRAVENOUS

## 2019-06-02 MED ORDER — ALBUTEROL SULFATE HFA 108 (90 BASE) MCG/ACT IN AERS
2.0000 | INHALATION_SPRAY | Freq: Four times a day (QID) | RESPIRATORY_TRACT | Status: DC
  Administered 2019-06-02 – 2019-06-21 (×77): 2 via RESPIRATORY_TRACT
  Filled 2019-06-02 (×2): qty 6.7

## 2019-06-02 MED ORDER — FLUTICASONE FUROATE-VILANTEROL 100-25 MCG/INH IN AEPB
1.0000 | INHALATION_SPRAY | Freq: Every day | RESPIRATORY_TRACT | Status: DC
  Administered 2019-06-02 – 2019-06-25 (×24): 1 via RESPIRATORY_TRACT
  Filled 2019-06-02 (×2): qty 28

## 2019-06-02 MED ORDER — UMECLIDINIUM BROMIDE 62.5 MCG/INH IN AEPB
1.0000 | INHALATION_SPRAY | Freq: Every day | RESPIRATORY_TRACT | Status: DC
  Administered 2019-06-02 – 2019-06-25 (×24): 1 via RESPIRATORY_TRACT
  Filled 2019-06-02 (×3): qty 7

## 2019-06-02 MED ORDER — DEXAMETHASONE 6 MG PO TABS
6.0000 mg | ORAL_TABLET | Freq: Every day | ORAL | Status: DC
  Administered 2019-06-02 – 2019-06-03 (×2): 6 mg via ORAL
  Filled 2019-06-02 (×2): qty 1

## 2019-06-02 NOTE — Plan of Care (Signed)
  Problem: Education: Goal: Knowledge of risk factors and measures for prevention of condition will improve Outcome: Progressing   Problem: Coping: Goal: Psychosocial and spiritual needs will be supported Outcome: Progressing   Problem: Respiratory: Goal: Will maintain a patent airway Outcome: Progressing Goal: Complications related to the disease process, condition or treatment will be avoided or minimized Outcome: Progressing

## 2019-06-02 NOTE — Progress Notes (Addendum)
[facility] called and talked to [employer]. Report was given. Will continue to monitor.  Update 0316: Daughter Ivin Booty was called, but after a couple of ring it went busy. Will try again later. Talked to the daughter Ivin Booty at [phone] and informed about the transform.  Update 0402: Pt VSS. Pt left 2 A via carelink. All documentation was handed to [place] staff. Report called to Patent attorney at [facility].

## 2019-06-02 NOTE — Significant Event (Signed)
Rapid Response Event Note  Overview: respiratory distress, hypoglycemia   Initial Focused Assessment: Pt resting in bed, good color. Alert to voice, neuro intact. Sats 95% on 5L NRB, which primary RN had placed prior to my arrival d/t sats in 80s s/p pt coughing. Per primary RN, pt had hypoglycemic event this AM in 50s, given 8oz orange juice, and f/u CBG was in 40s. Checked CBG myself, was 77. Wheezing ausculated bilaterally. Temp 97.7 oral, pt says he feels cold and was shivering despite normal temp, given warm blankets. DOE. Other VSS.   Interventions: CBG checked, hypoglycemia resolved. Switched pt back to low-flow [place] 4L, sats in 90, breathing unlabored, though pt still SOB w/exertion. Gave pt one puff of Combivent per EMR for wheezing.   Plan of Care (if not transferred): to stay in PCU. Primary RN to f/u with MD regarding long-acting insulin dosage since pt has had poor oral intake over last 24 hours (likely what led to the hypoglycemia).   Educated that NRB to only be used if flows of 10-15L are needed, [place] HFNC can be used if flows of 6-15L are needed.   RT notified of pt, will round on him this AM to follow up.    Henreitta Leber, RN 06/02/19 [phone]

## 2019-06-02 NOTE — Progress Notes (Signed)
Patient had a run of SVT 160s then converted to afib 110-120s. bp  MD made aware via text page

## 2019-06-02 NOTE — H&P (Signed)
History and Physical    Jay Morris YIR:[id number] DOB: 06/26/1946 DOA: 06/02/2019  PCP: [name], MD  Patient coming from: [facility]  I have personally briefly reviewed patient's old medical records in [place]  Chief Complaint: SOB  HPI: Jay Morris is a 73 y.o. male with medical history significant of COPD with asthma, PAF, DM2, HTN.  Patient presented to and was admitted to [facility] on 11/12 with SOB, fever with Tm 103 and chills.  No known sick contacts.  Denies any chest pain, cough, wheezing or palpitation.  Had N/V/D for 2 days PTA.   IP Course: Patient was admitted, started on empiric ABx for sepsis as well as steroids for COPD exacerbation.  His procalcitonin would come back at <0.10, and his outpatient COVID test he had done (Care everywhere tab > lab results > [facility] > virology/[facility]) on 11/11 just prior to admission to [facility], would come back POSITIVE for COVID-19.  His COVID-19 test at [facility] would, on the other hand, come back negative, but this is believed to be a false negative after discussion with Dr. Graylon Good.  Thus he was started on remdesivir and transferred to [facility].   Review of Systems: As per HPI, otherwise all review of systems negative.  Past Medical History:  Diagnosis Date   Asthma    Atrial fibrillation (HCC)    BPH (benign prostatic hyperplasia)    COPD (chronic obstructive pulmonary disease) ([place])    Diabetes mellitus without complication ([place])    Helicobacter pylori gastritis    Hyperlipidemia    Hypertension    Pneumonia     Past Surgical History:  Procedure Laterality Date   CARDIAC ELECTROPHYSIOLOGY MAPPING AND ABLATION     COLONOSCOPY WITH PROPOFOL N/A 06/15/2017   Procedure: COLONOSCOPY WITH PROPOFOL;  Surgeon: Toledo, Benay Pike, MD;  Location: [facility] ENDOSCOPY;  Service: Gastroenterology;  Laterality: N/A;   ENTEROSCOPY N/A 11/24/2017   Procedure: ENTEROSCOPY;  Surgeon: Jonathon Bellows, MD;  Location: [facility] ENDOSCOPY;   Service: Gastroenterology;  Laterality: N/A;   ESOPHAGOGASTRODUODENOSCOPY N/A 11/27/2017   Procedure: ESOPHAGOGASTRODUODENOSCOPY (EGD);  Surgeon: Lucilla Lame, MD;  Location: [facility] ENDOSCOPY;  Service: Endoscopy;  Laterality: N/A;   ESOPHAGOGASTRODUODENOSCOPY (EGD) WITH PROPOFOL N/A 06/02/2017   Procedure: ESOPHAGOGASTRODUODENOSCOPY (EGD) WITH PROPOFOL;  Surgeon: Toledo, Benay Pike, MD;  Location: [facility] ENDOSCOPY;  Service: Gastroenterology;  Laterality: N/A;   GIVENS CAPSULE STUDY N/A 11/25/2017   Procedure: GIVENS CAPSULE STUDY;  Surgeon: Jonathon Bellows, MD;  Location: [facility] ENDOSCOPY;  Service: Gastroenterology;  Laterality: N/A;   HAND SURGERY       reports that he has quit smoking. His smoking use included cigarettes. He has a 30.00 pack-year smoking history. He has never used smokeless tobacco. He reports that he does not drink alcohol or use drugs.  Allergies  Allergen Reactions   Shrimp [Shellfish Allergy] Anaphylaxis    Family History  Problem Relation Age of Onset   Heart disease Mother    Cancer Father      Prior to Admission medications   Medication Sig Start Date End Date Taking? Authorizing Provider  allopurinol (ZYLOPRIM) 100 MG tablet Take 100 mg by mouth daily.   Yes [provider]  atorvastatin (LIPITOR) 20 MG tablet Take 20 mg by mouth daily.   Yes [provider]  BREO ELLIPTA 100-25 MCG/INH AEPB Inhale 1 puff into the lungs daily. 12/04/15  Yes [provider]  colchicine 0.6 MG tablet Take 0.6 mg by mouth daily as needed (gout flare).  Yes [provider]  furosemide (LASIX) 20 MG tablet Take 20 mg by mouth.   Yes [provider]  indomethacin (INDOCIN) 50 MG capsule Take 50 mg by mouth 3 (three) times daily as needed (gout).   Yes [provider]  insulin degludec (TRESIBA FLEXTOUCH) 100 UNIT/ML SOPN FlexTouch Pen Inject 120 Units into the skin at bedtime.   Yes [provider]  Insulin Pen Needle  31G X 8 MM MISC BD Ultra-Fine Short Pen Needle 31 gauge x 5/16"   Yes [provider]  levofloxacin (LEVAQUIN) 750 MG tablet Take 750 mg by mouth daily.   Yes [provider]  metFORMIN (GLUCOPHAGE) 1000 MG tablet Take 1,000 mg by mouth 2 (two) times daily with a meal.   Yes [provider]  metoprolol tartrate (LOPRESSOR) 25 MG tablet Take 25 mg by mouth 2 (two) times daily.   Yes [provider]  ondansetron (ZOFRAN-ODT) 4 MG disintegrating tablet Take 1 tablet by mouth every 8 (eight) hours as needed. 05/30/19  Yes [provider]  PRADAXA 150 MG CAPS capsule Take 150 mg by mouth 2 (two) times daily. 12/11/15  Yes [provider]  tamsulosin (FLOMAX) 0.4 MG CAPS capsule Take 0.4 mg by mouth every evening.  12/17/15  Yes [provider]  omeprazole (PRILOSEC) 40 MG capsule Take 1 capsule (40 mg total) by mouth daily. 12/29/17 05/31/19  Jonathon Bellows, MD  vitamin B-12 (CYANOCOBALAMIN) 1000 MCG tablet Take 1 tablet (1,000 mcg total) by mouth daily. 11/29/17   Epifanio Lesches, MD    Physical Exam: Vitals:   06/02/19 0445  BP: 124/63  Pulse: 66  Resp: (!) 22  Temp: 97.6 F (36.4 C)  TempSrc: Oral  SpO2: 95%  Weight: 85.8 kg  Height: 5' 8" (1.727 m)    Constitutional: NAD, calm, comfortable Eyes: PERRL, lids and conjunctivae normal ENMT: Mucous membranes are moist. Posterior pharynx clear of any exudate or lesions.Normal dentition.  Neck: normal, supple, no masses, no thyromegaly Respiratory: clear to auscultation bilaterally, no wheezing, no crackles. Normal respiratory effort. No accessory muscle use.  Cardiovascular: Regular rate and rhythm, no murmurs / rubs / gallops. No extremity edema. 2+ pedal pulses. No carotid bruits.  Abdomen: no tenderness, no masses palpated. No hepatosplenomegaly. Bowel sounds positive.  Musculoskeletal: no clubbing / cyanosis. No joint deformity upper and lower extremities. Good ROM, no  contractures. Normal muscle tone.  Skin: no rashes, lesions, ulcers. No induration Neurologic: CN 2-12 grossly intact. Sensation intact, DTR normal. Strength 5/5 in all 4.  Psychiatric: Normal judgment and insight. Alert and oriented x 3. Normal mood.    Labs on Admission: I have personally reviewed following labs and imaging studies  CBC: Recent Labs  Lab 05/31/19 0329 06/01/19 0515  WBC 5.3 6.4  NEUTROABS 3.5  --   HGB 12.5* 11.1*  HCT 37.1* 33.4*  MCV 93.9 93.6  PLT 161 086   Basic Metabolic Panel: Recent Labs  Lab 05/31/19 0329 06/01/19 0515  NA 131* 134*  K 4.2 4.3  CL 98 102  CO2 22 22  GLUCOSE 97 235*  BUN 37* 40*  CREATININE 2.32* 1.80*  CALCIUM 7.6* 7.5*   GFR: Estimated Creatinine Clearance: 37.8 mL/min (A) (by C-G formula based on SCr of 1.8 mg/dL (H)). Liver Function Tests: Recent Labs  Lab 05/31/19 0329 06/01/19 0515  AST 39 30  ALT 26 24  ALKPHOS 30* 24*  BILITOT 1.1 0.6  PROT 7.3 6.3*  ALBUMIN 3.8 3.2*  No results for input(s): LIPASE, AMYLASE in the last 168 hours. No results for input(s): AMMONIA in the last 168 hours. Coagulation Profile: No results for input(s): INR, PROTIME in the last 168 hours. Cardiac Enzymes: No results for input(s): CKTOTAL, CKMB, CKMBINDEX, TROPONINI in the last 168 hours. BNP (last 3 results) No results for input(s): PROBNP in the last 8760 hours. HbA1C: Recent Labs    05/31/19 1525  HGBA1C 7.5*   CBG: Recent Labs  Lab 05/31/19 2132 06/01/19 0817 06/01/19 1126 06/01/19 1639 06/01/19 2100  GLUCAP 313* 221* 236* 201* 108*   Lipid Profile: No results for input(s): CHOL, HDL, LDLCALC, TRIG, CHOLHDL, LDLDIRECT in the last 72 hours. Thyroid Function Tests: No results for input(s): TSH, T4TOTAL, FREET4, T3FREE, THYROIDAB in the last 72 hours. Anemia Panel: Recent Labs    05/31/19 0616  FERRITIN 178   Urine analysis:    Component Value Date/Time   COLORURINE YELLOW (A) 01/05/2016 0408    APPEARANCEUR CLEAR (A) 01/05/2016 0408   LABSPEC 1.020 01/05/2016 0408   PHURINE 5.0 01/05/2016 0408   GLUCOSEU 50 (A) 01/05/2016 0408   HGBUR NEGATIVE 01/05/2016 0408   BILIRUBINUR NEGATIVE 01/05/2016 0408   KETONESUR NEGATIVE 01/05/2016 0408   PROTEINUR 100 (A) 01/05/2016 0408   NITRITE NEGATIVE 01/05/2016 0408   LEUKOCYTESUR NEGATIVE 01/05/2016 0408    Radiological Exams on Admission: Ct Abdomen Pelvis Wo Contrast  Result Date: 05/31/2019 CLINICAL DATA:  Weight loss, unintended, non-localized Abdominal pain, nausea vomiting and diarrhea. Shortness of breath. Fevers and chills over the last 2 days. Hypoxia EXAM: CT ABDOMEN AND PELVIS WITHOUT CONTRAST TECHNIQUE: Multidetector CT imaging of the abdomen and pelvis was performed following the standard protocol without IV contrast. COMPARISON:  CT of the abdomen and pelvis 11/21/2017 FINDINGS: Lower chest: Scarring of the lingula and right middle lobe is stable. Minimal dependent atelectasis is present. Heart size is normal. Hepatobiliary: There is diffuse fatty infiltration of the liver. No discrete lesions are present. The common bile duct and gallbladder are normal. Pancreas: Unremarkable. No pancreatic ductal dilatation or surrounding inflammatory changes. Spleen: Normal in size without focal abnormality. Adrenals/Urinary Tract: Adrenal glands are normal bilaterally. Kidneys and ureters are within normal limits. There is no stone or mass lesion. No obstruction is present. The urinary bladder is distended. No focal abnormality is present. Stomach/Bowel: The stomach and duodenum are within normal limits. Small bowel is unremarkable. Terminal ileum is within normal limits. Appendix is visualized and normal. The ascending and transverse colon are normal. The descending and sigmoid colon are within normal limits. Vascular/Lymphatic: Atherosclerotic calcifications are present in the aorta and branch vessels 50% narrowing is present at the origin of the  SMA. Moderate stenosis is present at the origin of the IMA. Dense atherosclerotic calcifications are present in the proximal iliac arteries bilaterally. Reproductive: Prostate is unremarkable. Other: No abdominal wall hernia or abnormality. No abdominopelvic ascites. Musculoskeletal: Advanced degenerative changes are present in the lower lumbar spine. No acute abnormalities are present. Bony pelvis is within normal limits. The hips are located and within normal limits. IMPRESSION: 1. No acute or focal lesion to explain the patient's symptoms. 2. Hepatic steatosis. 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 4. Aortic Atherosclerosis (ICD10-I70.0). Electronically Signed   By: San Morelle M.D.   On: 05/31/2019 07:11   Nm Pulmonary Perf And Vent  Result Date: 06/01/2019 CLINICAL DATA:  Hypoxemia, shortness of breath since Tuesday, COPD, asthma, question pulmonary  embolism EXAM: NUCLEAR MEDICINE VENTILATION - PERFUSION LUNG SCAN TECHNIQUE: Ventilation images were obtained in multiple projections using inhaled aerosol Tc-59mDTPA. Perfusion images were obtained in multiple projections after intravenous injection of Tc-935mAA. RADIOPHARMACEUTICALS:  31.98 mCi of Tc-9984mPA aerosol inhalation and 4.12 mCi Tc99m4m IV COMPARISON:  None Correlation: Chest radiograph 05/31/2019 FINDINGS: Ventilation: Overall did irregular diminished ventilation within the upper lobes. Focal ventilatory defect lateral aspect RIGHT upper lobe. Prominent LEFT hilum. Perfusion: Matching perfusion defect lateral RIGHT upper lobe. Remaining perfusion normal. No additional segmental or subsegmental perfusion defects. Chest radiograph: Scattered interstitial prominence in the periphery of the lungs bilaterally greater in RIGHT upper lobe at site of ventilation and perfusion abnormality. IMPRESSION: Low probability for pulmonary embolism. Electronically Signed   By: MarkLavonia Dana.   On:  06/01/2019 08:21    EKG: Independently reviewed.  Assessment/Plan Principal Problem:   COVID-19 virus infection Active Problems:   Uncontrolled type 2 diabetes mellitus with hyperglycemia, with long-term current use of insulin (HCC)   Paroxysmal atrial fibrillation (HCC)   Acute respiratory failure with hypoxia (HCC)   Acute renal failure superimposed on stage 3a chronic kidney disease (HCC)   COPD with acute exacerbation (HCC)[place] 1. Acute resp failure with hypoxia due to COVID-19 and COPD exacerbation - 1. Positive PCR for COVID on 05/30/2019 visible under care everywhere. 1. The negative COVID on 11/12/20202 is believed to be a false negative 2. This apparently discussed with Dr. SnydGraylon Goodor to transfer. 3. See Dr. DungJoesph Fillerse 2. COVID pathway 3. Cont pulse ox 4. Cont remdesivir 5. Continue steroids, will put on decadron 6. Procalcitonin negative: will stop the ABx 7. Daily labs 2. COPD exacerbation - 1. Scheduled albuterol 2. Continue Breo Ellipta 3. Start Incruse ellipta 4. Decadron as above 5. No ABx as above 3. AKI on CKD stage 3 - suspected pre-renal / ATN due to poor PO intake and N/V/D 1. Improved yesterday, IVF stopped and got 40mg97mlasix yesterday. 2. Will hold off on reordering lasix for now and see what his creat is doing this morning with admission labs 4. N/V/D - resolved for now, likely was due to COVID 1. [facility] 5. DM2 - 1. [facility] 2. Lantus 70u QHS 3. Mod scale SSI AC/HS 4. Suspect this will need to be modified with the steroids though 6. PAF - 1. Continue Metoprolol 2. Continue pradaxa  DVT prophylaxis: Pradaxa Code Status: Full Family Communication: No family in room Disposition Plan: Home after admit Consults called: None Admission status: Admit to inpatient  Severity of Illness: The appropriate patient status for this  patient is INPATIENT. Inpatient status is judged to be reasonable and necessary in order to provide the required intensity of service to ensure the patient's safety. The patient's presenting symptoms, physical exam findings, and initial radiographic and laboratory data in the context of their chronic comorbidities is felt to place them at high risk for further clinical deterioration. Furthermore, it is not anticipated that the patient will be medically stable for discharge from the hospital within 2 midnights of admission. The following factors support the patient status of inpatient.   IP status due to new O2 requirement secondary to COVID-19.  * I certify that at the point of admission it is my clinical judgment that the patient will require inpatient hospital care spanning beyond  2 midnights from the point of admission due to high intensity of service, high risk for further deterioration and high frequency of surveillance required.*    Telesa Jeancharles M. DO [facility]  How to contact the [facility] Attending or Consulting provider [place] or covering provider during after hours [place], for this patient?  1. Check the care team in [facility] and look for a) attending/consulting [facility] provider listed and b) the [facility] team listed 2. Log into [email or web address]  Amion Physician Scheduling and messaging for groups and whole hospitals  On call and physician scheduling software for group practices, residents, hospitalists and other medical providers for call, clinic, rotation and shift schedules. OnCall Enterprise is a hospital-wide system for scheduling doctors and paging doctors on call. EasyPlot is for scientific plotting and data analysis.  [email or web address]  and use [place]'s universal password to access. If you do not have the password, please contact the hospital operator.  3. Locate the [facility] provider you are looking for under [facility] and page to a number that you can be directly reached. 4. If you  still have difficulty reaching the provider, please page the [facility] (Director on Call) for the Hospitalists listed on amion for assistance.  06/02/2019, 5:37 AM

## 2019-06-02 NOTE — Progress Notes (Signed)
Rapid Response RN Rounding Note: Patient sitting up, alert and oriented. Non-labored breathing. 4L nasal cannula with sp02 at 94%. Voided 250cc. Glucose 141. RN set patient up to eat breakfast. All questions welcomed and answered. Primary RN can call ICU charge if they require any more assistance. [phone].

## 2019-06-02 NOTE — Progress Notes (Signed)
Updated wife on phone. She face timed with patient as well

## 2019-06-02 NOTE — Progress Notes (Signed)
PROGRESS NOTE  Brief Narrative: Jay Morris is a 73 y.o. male with a history of COPD, asthma, PAF on pradaxa, IDT2DM, and HTN who rpesented to [facility] 11/12 with fever, chills, and worsening shortness of breath. He had presented to his PCP 11/11 and was tested for covid which was pending. In the ED he was hypoxic with CXR showing increased interstitial markings, ECG NSR. He was given ceftriaxone, azithromycin and admitted to [facility] with an in-house test being negative for SARS-CoV-2. After admission, the initial covid test resulted positive, so he was given remdesivir, steroids, and admitted at [facility]. CRP and LDH were elevated though PCT and d-dimer were normal.  Subjective: Feels short of breath at rest, stable from past 24 hours. No chest pain. Has reverted to AFib this AM but has no palpitations, lightheadedness, or chest discomfort. Up in chair. Admitted this morning by Dr. Alcario Drought, had rapid response with hypoglycemia which has resolved. He's eating ok, but has changed taste/smell.   Objective: BP 136/87   Pulse (!) 108   Temp 98.1 F (36.7 C) (Oral)   Resp (!) 33   Ht _0  (1.727 m)   Wt 85.8 kg   SpO2 91%   BMI 28.76 kg/m   Gen: Elderly pleasant male in no distress.  Pulm: Crackles without wheezes, nonlabored but tachypneic on 4L O2.  CV: Irreg irreg on return visit w/rate 110-125. No murmur, no JVD, no edema GI: Soft, NT, ND, +BS  Neuro: Alert and oriented. No focal deficits. Skin: No rashes, lesions or ulcers. No hair on LE's.  Assessment & Plan: Principal Problem:   COVID-19 virus infection Active Problems:   Uncontrolled type 2 diabetes mellitus with hyperglycemia, with long-term current use of insulin (HCC)   Paroxysmal atrial fibrillation (HCC)   Acute respiratory failure with hypoxia (HCC)   Acute renal failure superimposed on stage 3a chronic kidney disease (HCC)   COPD with acute exacerbation (HCC)  Severe sepsis due to covid-19 pneumonia:  - Improving - Monitor  blood cultures, NGTD from 11/12.  Acute hypoxic respiratory failure due to covid-19 pneumonia: No wheezing, BNP 37. D-dimer below age-adjusted cutoff and V/Q scan revealed low probability of PE.  - Continue remdesivir 11/13 - 11/17 - DC antibiotics with negative PCT. CRP only modestly elevated initially. Unfortunately, he did remain at home with shortness of breath for several days, so treatment was rendered later in the course of illness. Low threshold to give convalescent plasma if worsening.  PAF with RVR:  - Increase dose of metoprolol to 3m BID (give additional 232mnow) as BP will sustain this  - Continue pradaxa  AKI on stage IIIa CKD: Resolving.  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  - Continue statin - Outpatient follow up.  Uncontrolled IDT2DM with hyperglycemia: Insulin since 2004 and very resistant based on requirements at home. HbA1c 7.5%. Confirmed he takes 110u qHS and 30u qAM, 30u qLunch, and 40u qDinner.  - Continue decreased-dose basal insulin pending trends today.  - Continue SSI. Based on hypoglycemia this AM, will not change orders, though if home regimen is predictive and steroids act as they usually do, anticipate needing to increase doses.   COPD with acute exacerbation: No longer has wheezing on exam.  - Continue steroids as above - Continue inhaled therapies as ordered including albuterol MDI  HTN:  - Continue metoprolol and lasix.  Nausea, vomiting:  Resolved. CT abd/pelvis without acute etiology.   Hepatic steatosis: Noted on CT.  - Monitor LFTs, especially w/remdesivir Tx.   Patrecia Pour, MD Pager on amion 06/02/2019, 11:22 AM

## 2019-06-02 NOTE — Progress Notes (Addendum)
No significant changes since last assessment other than patient required additional oxygen supplementation up to 3.5 L via nasal cannula. Patient also spiked a fever of 101 which improved with tylenol now 98.1. Currently stable for transfer to [place].   Rufina Falco, DNP, CCRN, FNP-C [facility] Nurse Practitioner Between 7pm to 7am - Pager [phone] Actively using [employer] secure chat messaging  After 7am go to [email or web address] - password:[email or web address] select [facility]  [facility] [employer]  [phone]

## 2019-06-02 NOTE — Plan of Care (Signed)
  Problem: Education: Goal: Knowledge of General Education information will improve Description: Including pain rating scale, medication(s)/side effects and non-pharmacologic comfort measures Outcome: Progressing   Problem: Clinical Measurements: Goal: Respiratory complications will improve Outcome: Progressing   Problem: Safety: Goal: Ability to remain free from injury will improve Outcome: Progressing

## 2019-06-03 DIAGNOSIS — A419 Sepsis, unspecified organism: Secondary | ICD-10-CM

## 2019-06-03 DIAGNOSIS — R6520 Severe sepsis without septic shock: Secondary | ICD-10-CM

## 2019-06-03 DIAGNOSIS — J449 Chronic obstructive pulmonary disease, unspecified: Secondary | ICD-10-CM

## 2019-06-03 DIAGNOSIS — I739 Peripheral vascular disease, unspecified: Secondary | ICD-10-CM

## 2019-06-03 DIAGNOSIS — I4891 Unspecified atrial fibrillation: Secondary | ICD-10-CM

## 2019-06-03 DIAGNOSIS — E785 Hyperlipidemia, unspecified: Secondary | ICD-10-CM

## 2019-06-03 DIAGNOSIS — K760 Fatty (change of) liver, not elsewhere classified: Secondary | ICD-10-CM

## 2019-06-03 LAB — COMPREHENSIVE METABOLIC PANEL
ALT: 29 U/L (ref 0–44)
AST: 51 U/L — ABNORMAL HIGH (ref 15–41)
Albumin: 3.4 g/dL — ABNORMAL LOW (ref 3.5–5.0)
Alkaline Phosphatase: 25 U/L — ABNORMAL LOW (ref 38–126)
Anion gap: 13 (ref 5–15)
BUN: 38 mg/dL — ABNORMAL HIGH (ref 8–23)
CO2: 25 mmol/L (ref 22–32)
Calcium: 8.6 mg/dL — ABNORMAL LOW (ref 8.9–10.3)
Chloride: 97 mmol/L — ABNORMAL LOW (ref 98–111)
Creatinine, Ser: 1.53 mg/dL — ABNORMAL HIGH (ref 0.61–1.24)
GFR calc Af Amer: 51 mL/min — ABNORMAL LOW (ref >60)
GFR calc non Af Amer: 44 mL/min — ABNORMAL LOW (ref >60)
Glucose, Bld: 61 mg/dL — ABNORMAL LOW (ref 70–99)
Potassium: 4.4 mmol/L (ref 3.5–5.1)
Sodium: 135 mmol/L (ref 135–145)
Total Bilirubin: 0.9 mg/dL (ref 0.3–1.2)
Total Protein: 7 g/dL (ref 6.5–8.1)

## 2019-06-03 LAB — CBC WITH DIFFERENTIAL/PLATELET
Abs Immature Granulocytes: 0.09 10*3/uL — ABNORMAL HIGH (ref 0.00–0.07)
Basophils Absolute: 0 10*3/uL (ref 0.0–0.1)
Basophils Relative: 0 %
Eosinophils Absolute: 0.3 10*3/uL (ref 0.0–0.5)
Eosinophils Relative: 4 %
HCT: 40.2 % (ref 39.0–52.0)
Hemoglobin: 13.2 g/dL (ref 13.0–17.0)
Immature Granulocytes: 1 %
Lymphocytes Relative: 11 %
Lymphs Abs: 1 10*3/uL (ref 0.7–4.0)
MCH: 31.4 pg (ref 26.0–34.0)
MCHC: 32.8 g/dL (ref 30.0–36.0)
MCV: 95.7 fL (ref 80.0–100.0)
Monocytes Absolute: 0.5 10*3/uL (ref 0.1–1.0)
Monocytes Relative: 6 %
Neutro Abs: 6.5 10*3/uL (ref 1.7–7.7)
Neutrophils Relative %: 78 %
Platelets: 162 10*3/uL (ref 150–400)
RBC: 4.2 MIL/uL — ABNORMAL LOW (ref 4.22–5.81)
RDW: 14 % (ref 11.5–15.5)
WBC: 8.4 10*3/uL (ref 4.0–10.5)
nRBC: 0 % (ref 0.0–0.2)

## 2019-06-03 LAB — D-DIMER, QUANTITATIVE: D-Dimer, Quant: 0.63 ug/mL-FEU — ABNORMAL HIGH (ref 0.00–0.50)

## 2019-06-03 LAB — GLUCOSE, CAPILLARY
Glucose-Capillary: 113 mg/dL — ABNORMAL HIGH (ref 70–99)
Glucose-Capillary: 118 mg/dL — ABNORMAL HIGH (ref 70–99)
Glucose-Capillary: 229 mg/dL — ABNORMAL HIGH (ref 70–99)
Glucose-Capillary: 254 mg/dL — ABNORMAL HIGH (ref 70–99)
Glucose-Capillary: 64 mg/dL — ABNORMAL LOW (ref 70–99)
Glucose-Capillary: 76 mg/dL (ref 70–99)
Glucose-Capillary: 80 mg/dL (ref 70–99)

## 2019-06-03 LAB — C-REACTIVE PROTEIN: CRP: 11.2 mg/dL — ABNORMAL HIGH (ref <1.0)

## 2019-06-03 MED ORDER — DIPHENHYDRAMINE HCL 25 MG PO CAPS
25.0000 mg | ORAL_CAPSULE | Freq: Four times a day (QID) | ORAL | Status: DC | PRN
  Administered 2019-06-03 – 2019-06-14 (×7): 25 mg via ORAL
  Filled 2019-06-03 (×7): qty 1

## 2019-06-03 MED ORDER — DEXAMETHASONE 6 MG PO TABS
6.0000 mg | ORAL_TABLET | Freq: Two times a day (BID) | ORAL | Status: AC
  Administered 2019-06-03 – 2019-06-12 (×19): 6 mg via ORAL
  Filled 2019-06-03 (×19): qty 1

## 2019-06-03 MED ORDER — METOPROLOL TARTRATE 25 MG PO TABS
50.0000 mg | ORAL_TABLET | Freq: Once | ORAL | Status: AC
  Administered 2019-06-03: 50 mg via ORAL
  Filled 2019-06-03: qty 2

## 2019-06-03 MED ORDER — SODIUM CHLORIDE 0.9% IV SOLUTION
Freq: Once | INTRAVENOUS | Status: AC
  Administered 2019-06-03: 18:00:00 via INTRAVENOUS

## 2019-06-03 MED ORDER — INSULIN GLARGINE 100 UNIT/ML ~~LOC~~ SOLN
50.0000 [IU] | Freq: Every day | SUBCUTANEOUS | Status: DC
  Administered 2019-06-03 – 2019-06-07 (×5): 50 [IU] via SUBCUTANEOUS
  Filled 2019-06-03 (×5): qty 0.5

## 2019-06-03 MED ORDER — METOPROLOL TARTRATE 50 MG PO TABS
100.0000 mg | ORAL_TABLET | Freq: Two times a day (BID) | ORAL | Status: DC
  Administered 2019-06-03 – 2019-06-11 (×17): 100 mg via ORAL
  Filled 2019-06-03: qty 2
  Filled 2019-06-03 (×11): qty 4
  Filled 2019-06-03: qty 2
  Filled 2019-06-03 (×5): qty 4

## 2019-06-03 NOTE — Progress Notes (Signed)
PROGRESS NOTE  Jay Morris  FIE:[id number] DOB: [birth year] DOA: 06/02/2019 PCP: [name], MD   Brief Narrative: Jay Morris is a 73 y.o. male with a history of COPD, asthma, PAF on pradaxa, IDT2DM, and HTN who presented to [facility] 11/12 with fever, chills, and worsening shortness of breath. He had presented to his PCP 11/11 and was tested for covid which was pending. In the ED he was hypoxic with CXR showing increased interstitial markings, ECG NSR. He was given ceftriaxone, azithromycin and admitted to [facility] with an in-house test being negative for SARS-CoV-2. After admission, the initial covid test resulted positive, so he was given remdesivir, steroids, and admitted at [facility]. CRP and LDH were elevated though PCT and d-dimer were normal. Hospitalization has been complicated by progressing hypoxia for which convalescent plasma is ordered and AFib with RVR for which metoprolol has been increased.  Assessment & Plan: Principal Problem:   COVID-19 virus infection Active Problems:   Uncontrolled type 2 diabetes mellitus with hyperglycemia, with long-term current use of insulin (HCC)   Paroxysmal atrial fibrillation (HCC)   Hyperlipidemia   COPD, moderate (HCC)   Acute respiratory failure with hypoxia (HCC)   Acute renal failure superimposed on stage 3a chronic kidney disease (HCC)   COPD with acute exacerbation (HCC)   Severe sepsis (HCC)   Atrial fibrillation with RVR (HCC)   PVD (peripheral vascular disease) ([place])   Hepatic steatosis  Severe sepsis due to covid-19 pneumonia:  - Improving - Monitor blood cultures, NGTD again today from 11/12.  Acute hypoxic respiratory failure due to covid-19 pneumonia: No wheezing, BNP 37. D-dimer below age-adjusted cutoff and V/Q scan revealed low probability of PE. DC'ed antibiotics with negative PCT.  - Continue remdesivir 11/13 - 11/17 - Augment steroids given abrupt increase in CRP - Discussed risks, benefits, alternatives of  convalescent plasma use under EUA by FDA. Patient opts to proceed after reviewing fact sheet and signing consent form.  - Unfortunately treatment was started later in the course of illness due to delay in presenting.  PAF with RVR: Improving control. Not hypotensive. - Increase dose of metoprolol further to 175m po BID (give additional 538mnow) as BP will sustain this  - Continue pradaxa  AKI on stage IIIa CKD: Continues to improve. - Restart home lasix 2086mo daily - Trend, avoid nephrotoxins.  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  - Continue statin - Outpatient follow up.  Uncontrolled IDT2DM with hyperglycemia: Insulin since 2004 and very resistant based on requirements at home. HbA1c 7.5%. Confirmed he takes 110u qHS and 30u qAM, 30u qLunch, and 40u qDinner.  - Will decrease basal insulin dose modestly due to fasting hypoglycemia. - Continue SSI.   COPD with acute exacerbation: No longer has wheezing on exam.  - Continue steroids as above - Continue inhaled therapies as ordered including albuterol MDI  HTN:  - Continue metoprolol and lasix.  Nausea, vomiting: Resolved. CT abd/pelvis without acute etiology.   Hepatic steatosis: Noted on CT.  - Monitor ALT (normal), especially w/remdesivir Tx.   AST elevation: In isolation, unclear significance.  - continue to monitor.  DVT prophylaxis: Pradaxa Code Status: Full Family Communication: None at bedside Disposition Plan: Uncertain  Consultants:   None  Procedures:   Echocardiogram 05/31/2019: 1. Left ventricular ejection fraction, by visual estimation, is 60 to 65%. The left ventricle has normal function. Left ventricular septal wall thickness was mildly increased. Mildly increased left ventricular posterior  wall thickness. There is mildly  increased left ventricular hypertrophy.  2. Global right ventricle has normal systolic function.The right ventricular size  is mildly enlarged. No increase in right ventricular wall thickness.  3. Left atrial size was normal.  4. Right atrial size was mildly dilated.  5. The mitral valve was not well visualized. Trace mitral valve regurgitation.  6. The tricuspid valve is not well visualized. Tricuspid valve regurgitation is mild.  7. The aortic valve is grossly normal. Aortic valve regurgitation is trivial.  8. The pulmonic valve was not well visualized. Pulmonic valve regurgitation is trivial.  9. The aortic root was not well visualized. 10. Mildly elevated pulmonary artery systolic pressure. 11. The atrial septum is grossly normal.  Antimicrobials:  Ceftriaxone, azithromycin  Remdesivir   Subjective: Actually feels like he's breathing better than yesterday. He's having intermittent nonradiating left chest pain that resolved spontaneously, not associated with exertion. Feels intermittent palpitations chronically.   Objective: Vitals:   06/02/19 2353 06/03/19 0100 06/03/19 0403 06/03/19 0736  BP: 119/69 139/75 (!) 141/74 118/84  Pulse: 87 87 89 90  Resp: (!) 28 (!) 23 (!) 24 (!) 23  Temp: 98.1 F (36.7 C) 98.2 F (36.8 C) 97.9 F (36.6 C) 97.7 F (36.5 C)  TempSrc: Oral Oral Oral Oral  SpO2: 91% (!) 89% 91% (!) 89%  Weight:      Height:        Intake/Output Summary (Last 24 hours) at 06/03/2019 1133 Last data filed at 06/03/2019 0934 Gross per 24 hour  Intake 496.25 ml  Output 2050 ml  Net -1553.75 ml   Filed Weights   06/02/19 0445  Weight: 85.8 kg    Gen: 73 y.o. male in no distress Pulm: Non-labored but tachypneic with supplemental oxygen. Coarse bilaterally without wheezes. CV: Rapid irreg 110-120 bpm. No murmur, rub, or gallop. No JVD, no significant pedal edema. GI: Abdomen soft, non-tender, non-distended, with normoactive bowel sounds. No organomegaly or masses felt. Ext: Warm, no deformities Skin: No rashes, lesions or ulcers Neuro: Alert and oriented. No focal neurological  deficits. Psych: Judgement and insight appear normal. Mood & affect appropriate.   Data Reviewed: I have personally reviewed following labs and imaging studies  CBC: Recent Labs  Lab 05/31/19 0329 06/01/19 0515 06/02/19 0740 06/03/19 0145  WBC 5.3 6.4 9.1 8.4  NEUTROABS 3.5  --  8.2* 6.5  HGB 12.5* 11.1* 12.7* 13.2  HCT 37.1* 33.4* 38.7* 40.2  MCV 93.9 93.6 96.5 95.7  PLT 161 156 163 840   Basic Metabolic Panel: Recent Labs  Lab 05/31/19 0329 06/01/19 0515 06/02/19 0740 06/03/19 0145  NA 131* 134* 134* 135  K 4.2 4.3 4.7 4.4  CL 98 102 99 97*  CO2 _0 GLUCOSE 97 235* 151* 61*  BUN 37* 40* 42* 38*  CREATININE 2.32* 1.80* 1.56* 1.53*  CALCIUM 7.6* 7.5* 8.3* 8.6*   GFR: Estimated Creatinine Clearance: 44.5 mL/min (A) (by C-G formula based on SCr of 1.53 mg/dL (H)). Liver Function Tests: Recent Labs  Lab 05/31/19 0329 06/01/19 0515 06/02/19 0740 06/03/19 0145  AST 39 30 45* 51*  ALT _1 ALKPHOS 30* 24* 25* 25*  BILITOT 1.1 0.6 0.7 0.9  PROT 7.3 6.3* 7.0 7.0  ALBUMIN 3.8 3.2* 3.5 3.4*   No results for input(s): LIPASE, AMYLASE in the last 168 hours. No results for input(s): AMMONIA in the last 168 hours. Coagulation Profile: No results for input(s): INR, PROTIME in the  last 168 hours. Cardiac Enzymes: No results for input(s): CKTOTAL, CKMB, CKMBINDEX, TROPONINI in the last 168 hours. BNP (last 3 results) No results for input(s): PROBNP in the last 8760 hours. HbA1C: Recent Labs    05/31/19 1525  HGBA1C 7.5*   CBG: Recent Labs  Lab 06/02/19 2115 06/02/19 2351 06/03/19 0406 06/03/19 0434 06/03/19 0733  GLUCAP 128* 113* 64* 80 76   Lipid Profile: No results for input(s): CHOL, HDL, LDLCALC, TRIG, CHOLHDL, LDLDIRECT in the last 72 hours. Thyroid Function Tests: No results for input(s): TSH, T4TOTAL, FREET4, T3FREE, THYROIDAB in the last 72 hours. Anemia Panel: No results for input(s): VITAMINB12, FOLATE, FERRITIN, TIBC, IRON,  RETICCTPCT in the last 72 hours. Urine analysis:    Component Value Date/Time   COLORURINE YELLOW (A) 01/05/2016 0408   APPEARANCEUR CLEAR (A) 01/05/2016 0408   LABSPEC 1.020 01/05/2016 0408   PHURINE 5.0 01/05/2016 0408   GLUCOSEU 50 (A) 01/05/2016 0408   HGBUR NEGATIVE 01/05/2016 0408   BILIRUBINUR NEGATIVE 01/05/2016 0408   KETONESUR NEGATIVE 01/05/2016 0408   PROTEINUR 100 (A) 01/05/2016 0408   NITRITE NEGATIVE 01/05/2016 0408   LEUKOCYTESUR NEGATIVE 01/05/2016 0408   Recent Results (from the past 240 hour(s))  SARS CORONAVIRUS 2 (TAT 6-24 HRS) Nasopharyngeal Nasopharyngeal Swab     Status: None   Collection Time: 05/31/19  5:34 AM   Specimen: Nasopharyngeal Swab  Result Value Ref Range Status   SARS Coronavirus 2 NEGATIVE NEGATIVE Final    Comment: (NOTE) SARS-CoV-2 target nucleic acids are NOT DETECTED. The SARS-CoV-2 RNA is generally detectable in upper and lower respiratory specimens during the acute phase of infection. Negative results do not preclude SARS-CoV-2 infection, do not rule out co-infections with other pathogens, and should not be used as the sole basis for treatment or other patient management decisions. Negative results must be combined with clinical observations, patient history, and epidemiological information. The expected result is Negative. Fact Sheet for Patients: SugarRoll.be Fact Sheet for Healthcare Providers: https://www.woods-mathews.com/ This test is not yet approved or cleared by the [place] FDA and  has been authorized for detection and/or diagnosis of SARS-CoV-2 by FDA under an Emergency Use Authorization (EUA). This EUA will remain  in effect (meaning this test can be used) for the duration of the COVID-19 declaration under Section 56 4(b)(1) of the Act, 21 U.S.C. section 360bbb-3(b)(1), unless the authorization is terminated or revoked sooner. Performed at [facility], [place]  [address] [place], [place] [address]   CULTURE, BLOOD (ROUTINE X 2) w Reflex to ID Panel     Status: None (Preliminary result)   Collection Time: 05/31/19  2:25 PM   Specimen: BLOOD  Result Value Ref Range Status   Specimen Description BLOOD LEFT ANTECUBITAL  Final   Special Requests   Final    BOTTLES DRAWN AEROBIC AND ANAEROBIC Blood Culture adequate volume   Culture   Final    NO GROWTH 2 DAYS Performed at [facility], [address] [place], [place] [address]    Report Status PENDING  Incomplete  CULTURE, BLOOD (ROUTINE X 2) w Reflex to ID Panel     Status: None (Preliminary result)   Collection Time: 05/31/19  2:25 PM   Specimen: BLOOD  Result Value Ref Range Status   Specimen Description BLOOD BLOOD LEFT HAND  Final   Special Requests   Final    BOTTLES DRAWN AEROBIC AND ANAEROBIC Blood Culture adequate volume   Culture   Final  NO GROWTH 2 DAYS Performed at [facility], [address], [place], [place] [address]    Report Status PENDING  Incomplete      Radiology Studies: No results found.  Scheduled Meds: . sodium chloride   Intravenous Once  . albuterol  2 puff Inhalation Q6H  . allopurinol  100 mg Oral Daily  . atorvastatin  20 mg Oral q1800  . dabigatran  150 mg Oral BID  . dexamethasone  6 mg Oral Q12H  . fluticasone furoate-vilanterol  1 puff Inhalation Daily  . furosemide  20 mg Oral Daily  . insulin aspart  0-15 Units Subcutaneous TID WC  . insulin aspart  0-5 Units Subcutaneous QHS  . insulin glargine  50 Units Subcutaneous QHS  . metoprolol tartrate  100 mg Oral BID  . metoprolol tartrate  50 mg Oral Once  . pantoprazole  40 mg Oral Daily  . tamsulosin  0.4 mg Oral QHS  . umeclidinium bromide  1 puff Inhalation Daily   Continuous Infusions: . sodium chloride Stopped (06/03/19 0934)  . remdesivir 100 mg in NS 250 mL 100 mg (06/03/19 0934)     LOS: 1 day   Time spent: 35 minutes.  Patrecia Pour, MD [facility]  [email or web address] 06/03/2019, 11:33 AM

## 2019-06-03 NOTE — Progress Notes (Signed)
Wife and daughter face timed with patient. This nurse updated them as well

## 2019-06-03 NOTE — Progress Notes (Signed)
Pts daughter updated over the phone and facetimed with patient. All questions answered.

## 2019-06-03 NOTE — Progress Notes (Signed)
Pt reported new "itching" to left flank that just began. Upon inspection this RN noticed an erythematous base with scattered wheels over an appx 6cm x 6cm area on the left. The patient denied shortness of breath and all VSS. MD notified of this change and that the patient had completed 1unit of convalescent plasma recently. Diphenhydramine ordered.

## 2019-06-03 NOTE — Progress Notes (Signed)
Spoke with Dr. Alcario Drought about patient's MEWs score, HR, and RR. Patient's HR is now 87, RR is 28.  Patient in bed resting.

## 2019-06-04 LAB — GLUCOSE, CAPILLARY
Glucose-Capillary: 83 mg/dL (ref 70–99)
Glucose-Capillary: 130 mg/dL — ABNORMAL HIGH (ref 70–99)
Glucose-Capillary: 272 mg/dL — ABNORMAL HIGH (ref 70–99)
Glucose-Capillary: 247 mg/dL — ABNORMAL HIGH (ref 70–99)

## 2019-06-04 LAB — CBC WITH DIFFERENTIAL/PLATELET
Abs Immature Granulocytes: 0.03 10*3/uL (ref 0.00–0.07)
Basophils Absolute: 0 10*3/uL (ref 0.0–0.1)
Basophils Relative: 0 %
Eosinophils Absolute: 0 10*3/uL (ref 0.0–0.5)
Eosinophils Relative: 0 %
HCT: 40.9 % (ref 39.0–52.0)
Hemoglobin: 13.7 g/dL (ref 13.0–17.0)
Immature Granulocytes: 0 %
Lymphocytes Relative: 8 %
Lymphs Abs: 0.6 10*3/uL — ABNORMAL LOW (ref 0.7–4.0)
MCH: 31.6 pg (ref 26.0–34.0)
MCHC: 33.5 g/dL (ref 30.0–36.0)
MCV: 94.2 fL (ref 80.0–100.0)
Monocytes Absolute: 0.4 10*3/uL (ref 0.1–1.0)
Monocytes Relative: 5 %
Neutro Abs: 6.2 10*3/uL (ref 1.7–7.7)
Neutrophils Relative %: 87 %
Platelets: 189 10*3/uL (ref 150–400)
RBC: 4.34 MIL/uL (ref 4.22–5.81)
RDW: 13.8 % (ref 11.5–15.5)
WBC: 7.1 10*3/uL (ref 4.0–10.5)
nRBC: 0 % (ref 0.0–0.2)

## 2019-06-04 LAB — PREPARE FRESH FROZEN PLASMA

## 2019-06-04 LAB — COMPREHENSIVE METABOLIC PANEL
ALT: 34 U/L (ref 0–44)
AST: 45 U/L — ABNORMAL HIGH (ref 15–41)
Albumin: 3.6 g/dL (ref 3.5–5.0)
Alkaline Phosphatase: 31 U/L — ABNORMAL LOW (ref 38–126)
Anion gap: 12 (ref 5–15)
BUN: 42 mg/dL — ABNORMAL HIGH (ref 8–23)
CO2: 27 mmol/L (ref 22–32)
Calcium: 8.7 mg/dL — ABNORMAL LOW (ref 8.9–10.3)
Chloride: 98 mmol/L (ref 98–111)
Creatinine, Ser: 1.5 mg/dL — ABNORMAL HIGH (ref 0.61–1.24)
GFR calc Af Amer: 52 mL/min — ABNORMAL LOW (ref >60)
GFR calc non Af Amer: 45 mL/min — ABNORMAL LOW (ref >60)
Glucose, Bld: 91 mg/dL (ref 70–99)
Potassium: 4.8 mmol/L (ref 3.5–5.1)
Sodium: 137 mmol/L (ref 135–145)
Total Bilirubin: 1.3 mg/dL — ABNORMAL HIGH (ref 0.3–1.2)
Total Protein: 7.1 g/dL (ref 6.5–8.1)

## 2019-06-04 LAB — BPAM FFP
Blood Product Expiration Date: 202011161510
ISSUE DATE / TIME: 202011151550
Unit Type and Rh: 6200

## 2019-06-04 LAB — D-DIMER, QUANTITATIVE: D-Dimer, Quant: 1.49 ug/mL-FEU — ABNORMAL HIGH (ref 0.00–0.50)

## 2019-06-04 LAB — C-REACTIVE PROTEIN: CRP: 7.9 mg/dL — ABNORMAL HIGH (ref <1.0)

## 2019-06-04 MED ORDER — DILTIAZEM HCL ER 60 MG PO CP12
60.0000 mg | ORAL_CAPSULE | Freq: Two times a day (BID) | ORAL | Status: DC
  Administered 2019-06-04 – 2019-06-13 (×19): 60 mg via ORAL
  Filled 2019-06-04 (×22): qty 1

## 2019-06-04 MED ORDER — TRAMADOL HCL 50 MG PO TABS
50.0000 mg | ORAL_TABLET | Freq: Two times a day (BID) | ORAL | Status: DC | PRN
  Administered 2019-06-08 – 2019-06-18 (×9): 50 mg via ORAL
  Filled 2019-06-04 (×10): qty 1

## 2019-06-04 NOTE — Progress Notes (Signed)
Patient daughter, Wilfred Lacy, called this evening to request a telephone update from the physician tomorrow. The patient gave verbal permission to this RN for information related to his health and hospitalization to be relayed to his daughter, Ivin Booty. Contact information for Ivin Booty has been placed in the patients chart.

## 2019-06-04 NOTE — Progress Notes (Addendum)
PROGRESS NOTE  Jay Morris  XHB:[id number] DOB: Sep 17, 1945 DOA: 06/02/2019 PCP: [name], MD   Brief Narrative: Jay Morris is a 73 y.o. male with a history of COPD, asthma, PAF on pradaxa, IDT2DM, and HTN who presented to [facility] 11/12 with fever, chills, and worsening shortness of breath. He had presented to his PCP 11/11 and was tested for covid which was pending. In the ED he was hypoxic with CXR showing increased interstitial markings, ECG NSR. He was given ceftriaxone, azithromycin and admitted to [facility] with an in-house test being negative for SARS-CoV-2. After admission, the initial covid test resulted positive, so he was given remdesivir, steroids, and admitted at [facility]. CRP and LDH were elevated though PCT and d-dimer were normal. Hospitalization has been complicated by progressing hypoxia for which convalescent plasma is ordered and AFib with RVR for which metoprolol has been increased.  Assessment & Plan: Principal Problem:   COVID-19 virus infection Active Problems:   Uncontrolled type 2 diabetes mellitus with hyperglycemia, with long-term current use of insulin (HCC)   Paroxysmal atrial fibrillation (HCC)   Hyperlipidemia   Degeneration of lumbar intervertebral disc   COPD, moderate (HCC)   Acute respiratory failure with hypoxia (HCC)   Acute renal failure superimposed on stage 3a chronic kidney disease (HCC)   COPD with acute exacerbation (HCC)   Severe sepsis (HCC)   Atrial fibrillation with RVR ([place])   PVD (peripheral vascular disease) ([place])   Hepatic steatosis  Severe sepsis due to covid-19 pneumonia:  - Improving - Monitor blood cultures, NG4D again today from 11/12.  Acute hypoxic respiratory failure due to covid-19 pneumonia: No wheezing, BNP 37. D-dimer below age-adjusted cutoff and V/Q scan revealed low probability of PE. DC'ed antibiotics with negative PCT.  - Continue remdesivir 11/13 - 11/17 - Augmented steroids given abrupt increase in CRP. CRP down  on 11/16.  - s/p CCP 11/15.  - D-dimer slightly up but pt on therapeutic anticoagulation.   PAF with RVR: Improving control. Not hypotensive. - Increased dose of metoprolol further to 110m po BID, remains elevated HR with occasional breakthrough (HR 150s later this AM), will add diltiazem CR [id number] and continue monitoring on telemetry. - Continue pradaxa  AKI on stage IIIa CKD: Continues to improve, back near baseline. - Restarted home lasix 2053mo daily - Trend, avoid nephrotoxins.  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  - Continue statin - Outpatient follow up.  Uncontrolled IDT2DM with hyperglycemia: Insulin since 2004 and very resistant based on requirements at home. HbA1c 7.5%. Confirmed he takes 110u qHS and 30u qAM, 30u qLunch, and 40u qDinner.  - Basal insulin dose correct w/fasting CBG at goal. - Continue SSI.   COPD with acute exacerbation: No longer has wheezing on exam.  - Continue steroids as above - Continue inhaled therapies as ordered including albuterol MDI. Tachycardia does not seem to be linked to beta agonist, but could change to xopenex if unable to control HR w/medications.  HTN:  - Continue metoprolol and lasix.  Nausea, vomiting: Resolved. CT abd/pelvis without acute etiology.   Hepatic steatosis: Noted on CT.  - Monitor ALT (remains normal), especially w/remdesivir Tx.   AST elevation: In isolation, unclear significance.  - Continue to monitor.  Lumbar DDD:  - Continue tylenol prn and add tramadol prn mod/severe pain. Pt aware that mobility is important.  DVT prophylaxis: Pradaxa Code Status: Full Family Communication: None at bedside Disposition Plan: Uncertain  Consultants:  None  Procedures:   Echocardiogram 05/31/2019: 1. Left ventricular ejection fraction, by visual estimation, is 60 to 65%. The left ventricle has normal function. Left ventricular septal wall thickness was  mildly increased. Mildly increased left ventricular posterior wall thickness. There is mildly  increased left ventricular hypertrophy.  2. Global right ventricle has normal systolic function.The right ventricular size is mildly enlarged. No increase in right ventricular wall thickness.  3. Left atrial size was normal.  4. Right atrial size was mildly dilated.  5. The mitral valve was not well visualized. Trace mitral valve regurgitation.  6. The tricuspid valve is not well visualized. Tricuspid valve regurgitation is mild.  7. The aortic valve is grossly normal. Aortic valve regurgitation is trivial.  8. The pulmonic valve was not well visualized. Pulmonic valve regurgitation is trivial.  9. The aortic root was not well visualized. 10. Mildly elevated pulmonary artery systolic pressure. 11. The atrial septum is grossly normal.  Antimicrobials:  Ceftriaxone, azithromycin  Remdesivir   Subjective: Breathing about the same as yesterday, though hypoxia has worsened. No fevers. Denies any current chest pain or palpitations. Had some body-wide itching after CCP which was resolved with benadryl and has not recurred. No other reactions noted. Got up to chair yesterday which helped his chronic lower back pain.   Objective: Vitals:   06/04/19 0005 06/04/19 0342 06/04/19 0346 06/04/19 0728  BP: 119/81 (!) 145/86  (!) 155/84  Pulse: 87 89  90  Resp: (!) 23 (!) 22  (!) 21  Temp: 97.8 F (36.6 C)  97.8 F (36.6 C) 97.8 F (36.6 C)  TempSrc: Oral  Oral Oral  SpO2: 91% 91%  90%  Weight:      Height:        Intake/Output Summary (Last 24 hours) at 06/04/2019 1101 Last data filed at 06/04/2019 1024 Gross per 24 hour  Intake 877.1 ml  Output 2925 ml  Net -2047.9 ml   Filed Weights   06/02/19 0445  Weight: 85.8 kg   Gen: 73 y.o. male in no distress Pulm: Not labored but tachypneic on 10L HFNC. Crackles diffusely. CV: Rapid irregular, rate 100-110bpm. No murmur, rub, or gallop. No JVD,  no dependent edema. GI: Abdomen soft, non-tender, non-distended, with normoactive bowel sounds.  Ext: Warm, no deformities Skin: No new rashes, lesions or ulcers on visualized skin. Neuro: Alert and oriented. No focal neurological deficits. Psych: Judgement and insight appear fair. Mood euthymic & affect congruent. Behavior is appropriate.    Data Reviewed: I have personally reviewed following labs and imaging studies  CBC: Recent Labs  Lab 05/31/19 0329 06/01/19 0515 06/02/19 0740 06/03/19 0145 06/04/19 0215  WBC 5.3 6.4 9.1 8.4 7.1  NEUTROABS 3.5  --  8.2* 6.5 6.2  HGB 12.5* 11.1* 12.7* 13.2 13.7  HCT 37.1* 33.4* 38.7* 40.2 40.9  MCV 93.9 93.6 96.5 95.7 94.2  PLT 161 156 163 162 782   Basic Metabolic Panel: Recent Labs  Lab 05/31/19 0329 06/01/19 0515 06/02/19 0740 06/03/19 0145 06/04/19 0215  NA 131* 134* 134* 135 137  K 4.2 4.3 4.7 4.4 4.8  CL 98 102 99 97* 98  CO2 _0 GLUCOSE 97 235* 151* 61* 91  BUN 37* 40* 42* 38* 42*  CREATININE 2.32* 1.80* 1.56* 1.53* 1.50*  CALCIUM 7.6* 7.5* 8.3* 8.6* 8.7*   GFR: Estimated Creatinine Clearance: 45.4 mL/min (A) (by C-G formula based on SCr of 1.5 mg/dL (H)). Liver Function Tests: Recent Labs  Lab  05/31/19 0329 06/01/19 0515 06/02/19 0740 06/03/19 0145 06/04/19 0215  AST 39 30 45* 51* 45*  ALT _0 34  ALKPHOS 30* 24* 25* 25* 31*  BILITOT 1.1 0.6 0.7 0.9 1.3*  PROT 7.3 6.3* 7.0 7.0 7.1  ALBUMIN 3.8 3.2* 3.5 3.4* 3.6   No results for input(s): LIPASE, AMYLASE in the last 168 hours. No results for input(s): AMMONIA in the last 168 hours. Coagulation Profile: No results for input(s): INR, PROTIME in the last 168 hours. Cardiac Enzymes: No results for input(s): CKTOTAL, CKMB, CKMBINDEX, TROPONINI in the last 168 hours. BNP (last 3 results) No results for input(s): PROBNP in the last 8760 hours. HbA1C: No results for input(s): HGBA1C in the last 72 hours. CBG: Recent Labs  Lab 06/03/19 0434  06/03/19 0733 06/03/19 1134 06/03/19 1524 06/03/19 2105  GLUCAP 80 76 118* 254* 229*   Lipid Profile: No results for input(s): CHOL, HDL, LDLCALC, TRIG, CHOLHDL, LDLDIRECT in the last 72 hours. Thyroid Function Tests: No results for input(s): TSH, T4TOTAL, FREET4, T3FREE, THYROIDAB in the last 72 hours. Anemia Panel: No results for input(s): VITAMINB12, FOLATE, FERRITIN, TIBC, IRON, RETICCTPCT in the last 72 hours. Urine analysis:    Component Value Date/Time   COLORURINE YELLOW (A) 01/05/2016 0408   APPEARANCEUR CLEAR (A) 01/05/2016 0408   LABSPEC 1.020 01/05/2016 0408   PHURINE 5.0 01/05/2016 0408   GLUCOSEU 50 (A) 01/05/2016 0408   HGBUR NEGATIVE 01/05/2016 0408   BILIRUBINUR NEGATIVE 01/05/2016 0408   KETONESUR NEGATIVE 01/05/2016 0408   PROTEINUR 100 (A) 01/05/2016 0408   NITRITE NEGATIVE 01/05/2016 0408   LEUKOCYTESUR NEGATIVE 01/05/2016 0408   Recent Results (from the past 240 hour(s))  SARS CORONAVIRUS 2 (TAT 6-24 HRS) Nasopharyngeal Nasopharyngeal Swab     Status: None   Collection Time: 05/31/19  5:34 AM   Specimen: Nasopharyngeal Swab  Result Value Ref Range Status   SARS Coronavirus 2 NEGATIVE NEGATIVE Final    Comment: (NOTE) SARS-CoV-2 target nucleic acids are NOT DETECTED. The SARS-CoV-2 RNA is generally detectable in upper and lower respiratory specimens during the acute phase of infection. Negative results do not preclude SARS-CoV-2 infection, do not rule out co-infections with other pathogens, and should not be used as the sole basis for treatment or other patient management decisions. Negative results must be combined with clinical observations, patient history, and epidemiological information. The expected result is Negative. Fact Sheet for Patients: SugarRoll.be Fact Sheet for Healthcare Providers: https://www.woods-mathews.com/ This test is not yet approved or cleared by the [place] FDA and  has been  authorized for detection and/or diagnosis of SARS-CoV-2 by FDA under an Emergency Use Authorization (EUA). This EUA will remain  in effect (meaning this test can be used) for the duration of the COVID-19 declaration under Section 56 4(b)(1) of the Act, 21 U.S.C. section 360bbb-3(b)(1), unless the authorization is terminated or revoked sooner. Performed at [facility], [place] [address], [place], [place] [address]   CULTURE, BLOOD (ROUTINE X 2) w Reflex to ID Panel     Status: None (Preliminary result)   Collection Time: 05/31/19  2:25 PM   Specimen: BLOOD  Result Value Ref Range Status   Specimen Description BLOOD LEFT ANTECUBITAL  Final   Special Requests   Final    BOTTLES DRAWN AEROBIC AND ANAEROBIC Blood Culture adequate volume   Culture   Final    NO GROWTH 4 DAYS Performed at [facility], [address] [place], [place] [address]  Report Status PENDING  Incomplete  CULTURE, BLOOD (ROUTINE X 2) w Reflex to ID Panel     Status: None (Preliminary result)   Collection Time: 05/31/19  2:25 PM   Specimen: BLOOD  Result Value Ref Range Status   Specimen Description BLOOD BLOOD LEFT HAND  Final   Special Requests   Final    BOTTLES DRAWN AEROBIC AND ANAEROBIC Blood Culture adequate volume   Culture   Final    NO GROWTH 4 DAYS Performed at [facility], [address], [place], [place] [address]    Report Status PENDING  Incomplete      Radiology Studies: No results found.  Scheduled Meds: . albuterol  2 puff Inhalation Q6H  . allopurinol  100 mg Oral Daily  . atorvastatin  20 mg Oral q1800  . dabigatran  150 mg Oral BID  . dexamethasone  6 mg Oral Q12H  . diltiazem  60 mg Oral Q12H  . fluticasone furoate-vilanterol  1 puff Inhalation Daily  . furosemide  20 mg Oral Daily  . insulin aspart  0-15 Units Subcutaneous TID WC  . insulin aspart  0-5 Units Subcutaneous QHS  . insulin glargine  50 Units Subcutaneous QHS  . metoprolol tartrate  100  mg Oral BID  . pantoprazole  40 mg Oral Daily  . tamsulosin  0.4 mg Oral QHS  . umeclidinium bromide  1 puff Inhalation Daily   Continuous Infusions: . sodium chloride Stopped (06/03/19 0934)  . remdesivir 100 mg in NS 250 mL 100 mg (06/03/19 0934)     LOS: 2 days   Time spent: 35 minutes.  Patrecia Pour, MD [facility] [email or web address] 06/04/2019, 11:01 AM

## 2019-06-05 LAB — COMPREHENSIVE METABOLIC PANEL
ALT: 32 U/L (ref 0–44)
AST: 34 U/L (ref 15–41)
Albumin: 3.3 g/dL — ABNORMAL LOW (ref 3.5–5.0)
Alkaline Phosphatase: 31 U/L — ABNORMAL LOW (ref 38–126)
Anion gap: 12 (ref 5–15)
BUN: 43 mg/dL — ABNORMAL HIGH (ref 8–23)
CO2: 24 mmol/L (ref 22–32)
Calcium: 8.4 mg/dL — ABNORMAL LOW (ref 8.9–10.3)
Chloride: 97 mmol/L — ABNORMAL LOW (ref 98–111)
Creatinine, Ser: 1.28 mg/dL — ABNORMAL HIGH (ref 0.61–1.24)
GFR calc Af Amer: >60 mL/min (ref >60)
GFR calc non Af Amer: 54 mL/min — ABNORMAL LOW (ref >60)
Glucose, Bld: 142 mg/dL — ABNORMAL HIGH (ref 70–99)
Potassium: 4.4 mmol/L (ref 3.5–5.1)
Sodium: 133 mmol/L — ABNORMAL LOW (ref 135–145)
Total Bilirubin: 1.5 mg/dL — ABNORMAL HIGH (ref 0.3–1.2)
Total Protein: 6.7 g/dL (ref 6.5–8.1)

## 2019-06-05 LAB — CBC WITH DIFFERENTIAL/PLATELET
Abs Immature Granulocytes: 0.01 10*3/uL (ref 0.00–0.07)
Basophils Absolute: 0 10*3/uL (ref 0.0–0.1)
Basophils Relative: 0 %
Eosinophils Absolute: 0 10*3/uL (ref 0.0–0.5)
Eosinophils Relative: 0 %
HCT: 39.4 % (ref 39.0–52.0)
Hemoglobin: 13 g/dL (ref 13.0–17.0)
Immature Granulocytes: 0 %
Lymphocytes Relative: 9 %
Lymphs Abs: 0.6 10*3/uL — ABNORMAL LOW (ref 0.7–4.0)
MCH: 30.6 pg (ref 26.0–34.0)
MCHC: 33 g/dL (ref 30.0–36.0)
MCV: 92.7 fL (ref 80.0–100.0)
Monocytes Absolute: 0.4 10*3/uL (ref 0.1–1.0)
Monocytes Relative: 6 %
Neutro Abs: 5.5 10*3/uL (ref 1.7–7.7)
Neutrophils Relative %: 85 %
Platelets: 237 10*3/uL (ref 150–400)
RBC: 4.25 MIL/uL (ref 4.22–5.81)
RDW: 13.8 % (ref 11.5–15.5)
WBC: 6.5 10*3/uL (ref 4.0–10.5)
nRBC: 0 % (ref 0.0–0.2)

## 2019-06-05 LAB — CULTURE, BLOOD (ROUTINE X 2)
Culture: NO GROWTH
Culture: NO GROWTH
Special Requests: ADEQUATE
Special Requests: ADEQUATE

## 2019-06-05 LAB — C-REACTIVE PROTEIN: CRP: 4.4 mg/dL — ABNORMAL HIGH (ref <1.0)

## 2019-06-05 LAB — D-DIMER, QUANTITATIVE: D-Dimer, Quant: 2.52 ug/mL-FEU — ABNORMAL HIGH (ref 0.00–0.50)

## 2019-06-05 LAB — GLUCOSE, CAPILLARY
Glucose-Capillary: 53 mg/dL — ABNORMAL LOW (ref 70–99)
Glucose-Capillary: 141 mg/dL — ABNORMAL HIGH (ref 70–99)
Glucose-Capillary: 202 mg/dL — ABNORMAL HIGH (ref 70–99)
Glucose-Capillary: 344 mg/dL — ABNORMAL HIGH (ref 70–99)
Glucose-Capillary: 347 mg/dL — ABNORMAL HIGH (ref 70–99)

## 2019-06-05 LAB — MAGNESIUM: Magnesium: 2 mg/dL (ref 1.7–2.4)

## 2019-06-05 NOTE — Progress Notes (Signed)
PROGRESS NOTE  Jay Morris  XBM:[id number] DOB: [birth year] DOA: 06/02/2019 PCP: [name], MD   Brief Narrative: Jay Morris is a 73 y.o. male with a history of COPD, asthma, PAF on pradaxa, IDT2DM, and HTN who presented to [facility] 11/12 with fever, chills, and worsening shortness of breath. He had presented to his PCP 11/11 and was tested for covid which was pending. In the ED he was hypoxic with CXR showing increased interstitial markings, ECG NSR. He was given ceftriaxone, azithromycin and admitted to [facility] with an in-house test being negative for SARS-CoV-2. After admission, the initial covid test resulted positive, so he was given remdesivir, steroids, and admitted at [facility]. CRP and LDH were elevated though PCT and d-dimer were normal. Hospitalization has been complicated by progressing hypoxia for which convalescent plasma was given and AFib with RVR for which metoprolol has been increased and diltiazem added. Hypoxia requiring 15L HFNC on 11/17.  Assessment & Plan: Principal Problem:   COVID-19 virus infection Active Problems:   Uncontrolled type 2 diabetes mellitus with hyperglycemia, with long-term current use of insulin (HCC)   Paroxysmal atrial fibrillation (HCC)   Hyperlipidemia   Degeneration of lumbar intervertebral disc   COPD, moderate (HCC)   Acute respiratory failure with hypoxia (HCC)   Acute renal failure superimposed on stage 3a chronic kidney disease (HCC)   COPD with acute exacerbation (HCC)   Severe sepsis (HCC)   Atrial fibrillation with RVR ([place])   PVD (peripheral vascular disease) ([place])   Hepatic steatosis  Severe sepsis due to covid-19 pneumonia: Blood cultures 11/12 negative. - Improving  Acute hypoxic respiratory failure due to covid-19 pneumonia: No wheezing, BNP 37. D-dimer below age-adjusted cutoff and V/Q scan revealed low probability of PE. DC'ed antibiotics with negative PCT.  - Continue remdesivir 11/13 - 11/17 - Augmented steroids given  abrupt increase in CRP. CRP now declining. - s/p CCP 11/15.  - D-dimer slightly up but pt on therapeutic anticoagulation.   PAF with RVR: Improving control. Not hypotensive. Back to NSR 11/17. - Increased dose of metoprolol further to 11m po BID, added diltiazem CR 62mq12h with improvement. - Continue pradaxa  AKI on stage IIIa CKD: Continues to improve, back near baseline. - Restarted home lasix 2078mo daily, Cr still improving. - Trend, avoid nephrotoxins.  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  - Continue statin - Outpatient follow up.  Uncontrolled IDT2DM with hyperglycemia: Insulin since 2004 and very resistant based on requirements at home. HbA1c 7.5%. Confirmed he takes 110u qHS and 30u qAM, 30u qLunch, and 40u qDinner.  Continue basal, bolus insulin as ordered  COPD with acute exacerbation: No longer has wheezing on exam.  - Continue steroids as above - Continue inhaled therapies as ordered including albuterol MDI.   HTN:  - Continue metoprolol and lasix.  Nausea, vomiting: Resolved. CT abd/pelvis without acute etiology.   Hepatic steatosis: Noted on CT.  - Monitor ALT (remains normal), especially w/remdesivir Tx.   AST elevation: In isolation, unclear significance.  - Continue to monitor.  Lumbar DDD:  - Continue tylenol prn and add tramadol prn mod/severe pain. Pt aware that mobility is important.  DVT prophylaxis: Pradaxa Code Status: Full Family Communication: None at bedside. Sister by phone today Disposition Plan: Uncertain, [facility]   None  Procedures:   Echocardiogram 05/31/2019: 1. Left ventricular ejection fraction, by visual estimation, is 60 to 65%. The left  ventricle has normal function. Left ventricular septal wall thickness was mildly increased. Mildly increased left ventricular posterior wall thickness. There is mildly  increased left  ventricular hypertrophy.  2. Global right ventricle has normal systolic function.The right ventricular size is mildly enlarged. No increase in right ventricular wall thickness.  3. Left atrial size was normal.  4. Right atrial size was mildly dilated.  5. The mitral valve was not well visualized. Trace mitral valve regurgitation.  6. The tricuspid valve is not well visualized. Tricuspid valve regurgitation is mild.  7. The aortic valve is grossly normal. Aortic valve regurgitation is trivial.  8. The pulmonic valve was not well visualized. Pulmonic valve regurgitation is trivial.  9. The aortic root was not well visualized. 10. Mildly elevated pulmonary artery systolic pressure. 11. The atrial septum is grossly normal.  Antimicrobials:  Ceftriaxone, azithromycin  Remdesivir   Subjective: Feels again about the same as yesterday as far as moederate-severe dyspnea which is constant, improved with rest and oxygen, worsened by exertion. Having some chest pain with cough, not severe. Has converted to NSR.  Objective: Vitals:   06/04/19 2300 06/05/19 0359 06/05/19 0800 06/05/19 1337  BP: 127/78 138/86 122/75   Pulse: 90 89    Resp: (!) 21 (!) 22    Temp: 98.1 F (36.7 C) 98.5 F (36.9 C) 98.2 F (36.8 C) 98 F (36.7 C)  TempSrc: Oral Oral Oral Oral  SpO2: 92% (!) 89%    Weight:      Height:        Intake/Output Summary (Last 24 hours) at 06/05/2019 1341 Last data filed at 06/05/2019 9969 Gross per 24 hour  Intake 540 ml  Output 2250 ml  Net -1710 ml   Filed Weights   06/02/19 0445  Weight: 85.8 kg   Gen: 73 y.o. male in no distress Pulm: Nonlabored, mildly tachypneic but comfortable. Crackles bilaterally CV: Regular without murmur, rub, or gallop. No JVD, no dependent edema. GI: Abdomen soft, non-tender, non-distended, with normoactive bowel sounds.  Ext: Warm, no deformities Skin: No rashes, lesions or ulcers on visualized skin. Neuro: Alert and oriented. No focal  neurological deficits. Psych: Judgement and insight appear fair. Mood euthymic & affect congruent. Behavior is appropriate.    Data Reviewed: I have personally reviewed following labs and imaging studies  CBC: Recent Labs  Lab 05/31/19 0329 06/01/19 0515 06/02/19 0740 06/03/19 0145 06/04/19 0215 06/05/19 0650  WBC 5.3 6.4 9.1 8.4 7.1 6.5  NEUTROABS 3.5  --  8.2* 6.5 6.2 5.5  HGB 12.5* 11.1* 12.7* 13.2 13.7 13.0  HCT 37.1* 33.4* 38.7* 40.2 40.9 39.4  MCV 93.9 93.6 96.5 95.7 94.2 92.7  PLT 161 156 163 162 189 249   Basic Metabolic Panel: Recent Labs  Lab 06/01/19 0515 06/02/19 0740 06/03/19 0145 06/04/19 0215 06/05/19 0650  NA 134* 134* 135 137 133*  K 4.3 4.7 4.4 4.8 4.4  CL 102 99 97* 98 97*  CO2 _0 GLUCOSE 235* 151* 61* 91 142*  BUN 40* 42* 38* 42* 43*  CREATININE 1.80* 1.56* 1.53* 1.50* 1.28*  CALCIUM 7.5* 8.3* 8.6* 8.7* 8.4*  MG  --   --   --   --  2.0   GFR: Estimated Creatinine Clearance: 53.2 mL/min (A) (by C-G formula based on SCr of 1.28 mg/dL (H)). Liver Function Tests: Recent Labs  Lab 06/01/19 0515 06/02/19 0740 06/03/19 0145 06/04/19 0215 06/05/19 0650  AST 30 45* 51* 45* 34  ALT  _0 34 32  ALKPHOS 24* 25* 25* 31* 31*  BILITOT 0.6 0.7 0.9 1.3* 1.5*  PROT 6.3* 7.0 7.0 7.1 6.7  ALBUMIN 3.2* 3.5 3.4* 3.6 3.3*   No results for input(s): LIPASE, AMYLASE in the last 168 hours. No results for input(s): AMMONIA in the last 168 hours. Coagulation Profile: No results for input(s): INR, PROTIME in the last 168 hours. Cardiac Enzymes: No results for input(s): CKTOTAL, CKMB, CKMBINDEX, TROPONINI in the last 168 hours. BNP (last 3 results) No results for input(s): PROBNP in the last 8760 hours. HbA1C: No results for input(s): HGBA1C in the last 72 hours. CBG: Recent Labs  Lab 06/04/19 1239 06/04/19 1627 06/04/19 2204 06/05/19 0730 06/05/19 1143  GLUCAP 130* 272* 247* 141* 202*   Lipid Profile: No results for input(s): CHOL,  HDL, LDLCALC, TRIG, CHOLHDL, LDLDIRECT in the last 72 hours. Thyroid Function Tests: No results for input(s): TSH, T4TOTAL, FREET4, T3FREE, THYROIDAB in the last 72 hours. Anemia Panel: No results for input(s): VITAMINB12, FOLATE, FERRITIN, TIBC, IRON, RETICCTPCT in the last 72 hours. Urine analysis:    Component Value Date/Time   COLORURINE YELLOW (A) 01/05/2016 0408   APPEARANCEUR CLEAR (A) 01/05/2016 0408   LABSPEC 1.020 01/05/2016 0408   PHURINE 5.0 01/05/2016 0408   GLUCOSEU 50 (A) 01/05/2016 0408   HGBUR NEGATIVE 01/05/2016 0408   BILIRUBINUR NEGATIVE 01/05/2016 0408   KETONESUR NEGATIVE 01/05/2016 0408   PROTEINUR 100 (A) 01/05/2016 0408   NITRITE NEGATIVE 01/05/2016 0408   LEUKOCYTESUR NEGATIVE 01/05/2016 0408   Recent Results (from the past 240 hour(s))  SARS CORONAVIRUS 2 (TAT 6-24 HRS) Nasopharyngeal Nasopharyngeal Swab     Status: None   Collection Time: 05/31/19  5:34 AM   Specimen: Nasopharyngeal Swab  Result Value Ref Range Status   SARS Coronavirus 2 NEGATIVE NEGATIVE Final    Comment: (NOTE) SARS-CoV-2 target nucleic acids are NOT DETECTED. The SARS-CoV-2 RNA is generally detectable in upper and lower respiratory specimens during the acute phase of infection. Negative results do not preclude SARS-CoV-2 infection, do not rule out co-infections with other pathogens, and should not be used as the sole basis for treatment or other patient management decisions. Negative results must be combined with clinical observations, patient history, and epidemiological information. The expected result is Negative. Fact Sheet for Patients: SugarRoll.be Fact Sheet for Healthcare Providers: https://www.woods-mathews.com/ This test is not yet approved or cleared by the [place] FDA and  has been authorized for detection and/or diagnosis of SARS-CoV-2 by FDA under an Emergency Use Authorization (EUA). This EUA will remain  in effect  (meaning this test can be used) for the duration of the COVID-19 declaration under Section 56 4(b)(1) of the Act, 21 U.S.C. section 360bbb-3(b)(1), unless the authorization is terminated or revoked sooner. Performed at [facility], [place] [address] [place], [place] [address]   CULTURE, BLOOD (ROUTINE X 2) w Reflex to ID Panel     Status: None   Collection Time: 05/31/19  2:25 PM   Specimen: BLOOD  Result Value Ref Range Status   Specimen Description BLOOD LEFT ANTECUBITAL  Final   Special Requests   Final    BOTTLES DRAWN AEROBIC AND ANAEROBIC Blood Culture adequate volume   Culture   Final    NO GROWTH 5 DAYS Performed at [facility], [address] [place], [place] [address]    Report Status 06/05/2019 FINAL  Final  CULTURE, BLOOD (ROUTINE X 2) w Reflex to ID Panel  Status: None   Collection Time: 05/31/19  2:25 PM   Specimen: BLOOD  Result Value Ref Range Status   Specimen Description BLOOD BLOOD LEFT HAND  Final   Special Requests   Final    BOTTLES DRAWN AEROBIC AND ANAEROBIC Blood Culture adequate volume   Culture   Final    NO GROWTH 5 DAYS Performed at [facility], [address] [place], [place] [address]    Report Status 06/05/2019 FINAL  Final      Radiology Studies: No results found.  Scheduled Meds: . albuterol  2 puff Inhalation Q6H  . allopurinol  100 mg Oral Daily  . atorvastatin  20 mg Oral q1800  . dabigatran  150 mg Oral BID  . dexamethasone  6 mg Oral Q12H  . diltiazem  60 mg Oral Q12H  . fluticasone furoate-vilanterol  1 puff Inhalation Daily  . furosemide  20 mg Oral Daily  . insulin aspart  0-15 Units Subcutaneous TID WC  . insulin aspart  0-5 Units Subcutaneous QHS  . insulin glargine  50 Units Subcutaneous QHS  . metoprolol tartrate  100 mg Oral BID  . pantoprazole  40 mg Oral Daily  . tamsulosin  0.4 mg Oral QHS  . umeclidinium bromide  1 puff Inhalation Daily   Continuous Infusions: . sodium chloride  Stopped (06/03/19 0934)     LOS: 3 days   Time spent: 35 minutes.  Patrecia Pour, MD [facility] [email or web address] 06/05/2019, 1:41 PM

## 2019-06-06 LAB — COMPREHENSIVE METABOLIC PANEL
ALT: 31 U/L (ref 0–44)
AST: 27 U/L (ref 15–41)
Albumin: 3 g/dL — ABNORMAL LOW (ref 3.5–5.0)
Alkaline Phosphatase: 33 U/L — ABNORMAL LOW (ref 38–126)
Anion gap: 12 (ref 5–15)
BUN: 42 mg/dL — ABNORMAL HIGH (ref 8–23)
CO2: 22 mmol/L (ref 22–32)
Calcium: 8.6 mg/dL — ABNORMAL LOW (ref 8.9–10.3)
Chloride: 98 mmol/L (ref 98–111)
Creatinine, Ser: 1.32 mg/dL — ABNORMAL HIGH (ref 0.61–1.24)
GFR calc Af Amer: >60 mL/min (ref >60)
GFR calc non Af Amer: 52 mL/min — ABNORMAL LOW (ref >60)
Glucose, Bld: 237 mg/dL — ABNORMAL HIGH (ref 70–99)
Potassium: 4.6 mmol/L (ref 3.5–5.1)
Sodium: 132 mmol/L — ABNORMAL LOW (ref 135–145)
Total Bilirubin: 1 mg/dL (ref 0.3–1.2)
Total Protein: 6.4 g/dL — ABNORMAL LOW (ref 6.5–8.1)

## 2019-06-06 LAB — D-DIMER, QUANTITATIVE: D-Dimer, Quant: 1.59 ug/mL-FEU — ABNORMAL HIGH (ref 0.00–0.50)

## 2019-06-06 LAB — CBC WITH DIFFERENTIAL/PLATELET
Abs Immature Granulocytes: 0.06 10*3/uL (ref 0.00–0.07)
Basophils Absolute: 0 10*3/uL (ref 0.0–0.1)
Basophils Relative: 0 %
Eosinophils Absolute: 0 10*3/uL (ref 0.0–0.5)
Eosinophils Relative: 0 %
HCT: 39.4 % (ref 39.0–52.0)
Hemoglobin: 13.7 g/dL (ref 13.0–17.0)
Immature Granulocytes: 1 %
Lymphocytes Relative: 9 %
Lymphs Abs: 0.8 10*3/uL (ref 0.7–4.0)
MCH: 32 pg (ref 26.0–34.0)
MCHC: 34.8 g/dL (ref 30.0–36.0)
MCV: 92.1 fL (ref 80.0–100.0)
Monocytes Absolute: 0.6 10*3/uL (ref 0.1–1.0)
Monocytes Relative: 7 %
Neutro Abs: 7.7 10*3/uL (ref 1.7–7.7)
Neutrophils Relative %: 83 %
Platelets: 289 10*3/uL (ref 150–400)
RBC: 4.28 MIL/uL (ref 4.22–5.81)
RDW: 13.6 % (ref 11.5–15.5)
WBC: 9.2 10*3/uL (ref 4.0–10.5)
nRBC: 0 % (ref 0.0–0.2)

## 2019-06-06 LAB — GLUCOSE, CAPILLARY
Glucose-Capillary: 212 mg/dL — ABNORMAL HIGH (ref 70–99)
Glucose-Capillary: 333 mg/dL — ABNORMAL HIGH (ref 70–99)
Glucose-Capillary: 323 mg/dL — ABNORMAL HIGH (ref 70–99)
Glucose-Capillary: 369 mg/dL — ABNORMAL HIGH (ref 70–99)

## 2019-06-06 LAB — C-REACTIVE PROTEIN: CRP: 2 mg/dL — ABNORMAL HIGH (ref <1.0)

## 2019-06-06 MED ORDER — POLYETHYLENE GLYCOL 3350 17 G PO PACK
34.0000 g | PACK | Freq: Two times a day (BID) | ORAL | Status: DC
  Administered 2019-06-06 – 2019-06-15 (×9): 34 g via ORAL
  Filled 2019-06-06 (×11): qty 2

## 2019-06-06 MED ORDER — PEG 3350-KCL-NA BICARB-NACL 420 G PO SOLR: 4000.0000 mL | Freq: Once | ORAL | Status: DC

## 2019-06-06 MED ORDER — SENNA 8.6 MG PO TABS
1.0000 | ORAL_TABLET | Freq: Every day | ORAL | Status: DC | PRN
  Administered 2019-06-07 – 2019-06-15 (×2): 8.6 mg via ORAL
  Filled 2019-06-06 (×2): qty 1

## 2019-06-06 NOTE — Progress Notes (Addendum)
Inpatient Diabetes Program Recommendations  AACE/ADA: New Consensus Statement on Inpatient Glycemic Control (2015)  Target Ranges:  Prepandial:   less than 140 mg/dL      Peak postprandial:   less than 180 mg/dL (1-2 hours)      Critically ill patients:  140 - 180 mg/dL   Lab Results  Component Value Date   GLUCAP 212 (H) 06/06/2019   HGBA1C 7.5 (H) 05/31/2019    Review of Glycemic Control Results for Jay Morris, PULIS (MRN [id number]) as of 06/06/2019 11:41  Ref. Range 06/05/2019 07:30 06/05/2019 11:43 06/05/2019 17:07 06/05/2019 20:43 06/06/2019 07:12  Glucose-Capillary Latest Ref Range: 70 - 99 mg/dL 141 (H) 202 (H) 344 (H) 347 (H) 212 (H)   Admit Sepsis/ Hypoxia/ Febrile illness with associated weakness/ N/V/D  History: DM, COPD   Home DM Meds: Tresiba 120 units QHS                              Novolog 30 units TID                              Metformin 1000 mg BID  Current orders for Inpatient glycemic control: Lantus 50 units + Novolog moderate correction tid + hs  Inpatient Diabetes Program Recommendations:    -Increase Lantus to 70 units daily -Add Novolog 10 units tid meal coverage if patient eats 50%  Thank you, Bethena Roys E. Dayshia Ballinas, RN, MSN, CDE  Diabetes Coordinator Inpatient Glycemic Control Team Team Pager [phone] (8am-5pm) 06/06/2019 11:45 AM

## 2019-06-06 NOTE — Progress Notes (Signed)
Assisted patient to face-time daughter, Ivin Booty. Updates provided, all questions/ concerns answered at this time.

## 2019-06-06 NOTE — Progress Notes (Addendum)
PROGRESS NOTE  Jay Morris  CWC:[id number] DOB: [birth year] DOA: 06/02/2019 PCP: [name], MD   Brief Narrative: Jay Morris is a 73 y.o. male with a history of COPD, asthma, PAF on pradaxa, IDT2DM, and HTN who presented to [facility] 11/12 with fever, chills, and worsening shortness of breath. He had presented to his PCP 11/11 and was tested for covid which was pending. In the ED he was hypoxic with CXR showing increased interstitial markings, ECG NSR. He was given ceftriaxone, azithromycin and admitted to [facility] with an in-house test being negative for SARS-CoV-2. After admission, the initial covid test resulted positive, so he was given remdesivir, steroids, and admitted at [facility]. CRP and LDH were elevated though PCT and d-dimer were normal. Hospitalization has been complicated by progressing hypoxia for which convalescent plasma was given and AFib with RVR for which metoprolol has been increased and diltiazem added. Hypoxia requiring 15L HFNC on 11/17.  Assessment & Plan: Principal Problem:   COVID-19 virus infection Active Problems:   Acute respiratory failure with hypoxia (HCC)   Paroxysmal atrial fibrillation with RVR ([place])   Uncontrolled type 2 diabetes mellitus with hyperglycemia, with long-term current use of insulin (HCC)   Hyperlipidemia   Degeneration of lumbar intervertebral disc   COPD, moderate (HCC)   Acute renal failure superimposed on stage 3a chronic kidney disease (HCC)   COPD with acute exacerbation (HCC)   PVD (peripheral vascular disease) (HCC)   Hepatic steatosis   Acute hypoxic respiratory failure due to acute covid-19 pneumonia, POA:  No wheezing, BNP 37. D-dimer below age-adjusted cutoff and V/Q scan revealed low probability of PE. DC'ed antibiotics with negative PCT.  - Completed remdesivir 11/17 - Augmented steroids given abrupt increase in CRP. CRP now declining. - S/P CCP 11/15.  - D-dimer slightly up but pt on therapeutic anticoagulation.   PAF  with RVR:  Improving control. Not hypotensive. Back to NSR 11/17. - Increased dose of metoprolol further to 154m po BID, added diltiazem CR [id number] with improvement. - Continue pradaxa  AKI on stage IIIa CKD: Continues to improve, back near baseline. - Restarted home lasix 2064mo daily, Cr still improving. - Trend, avoid nephrotoxins.  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  - Continue statin - Outpatient follow up.  Uncontrolled IDT2DM with hyperglycemia: Insulin since 2004 and very resistant based on requirements at home. HbA1c 7.5%. Confirmed he takes 110u qHS and 30u qAM, 30u qLunch, and 40u qDinner.  Continue basal, bolus insulin as ordered  HTN:  - Continue metoprolol and lasix.  Nausea, vomiting:  - Resolved. CT abd/pelvis without acute etiology.   Hepatic steatosis:  - Noted on CT.  - Monitor ALT (remains normal), especially w/remdesivir Tx.   AST elevation:  - In isolation, unclear significance.  - Continue to monitor.  Lumbar DDD:  - Continue tylenol prn and add tramadol prn mod/severe pain. Pt aware that mobility is important.  DVT prophylaxis: Pradaxa Code Status: Full Family Communication: None at bedside. Sister by phone today Disposition Plan: Uncertain, may require transfer to ICU [facility]:   None  Procedures:   Echocardiogram 05/31/2019: 1. Left ventricular ejection fraction, by visual estimation, is 60 to 65%. The left ventricle has normal function. Left ventricular septal wall thickness was mildly increased. Mildly increased left ventricular posterior wall thickness. There is mildly  increased left ventricular hypertrophy.  2. Global right ventricle has normal systolic function.The right ventricular size is mildly  enlarged. No increase in right ventricular wall thickness.  3. Left atrial size was normal.  4. Right atrial size was mildly dilated.  5. The mitral valve was not well  visualized. Trace mitral valve regurgitation.  6. The tricuspid valve is not well visualized. Tricuspid valve regurgitation is mild.  7. The aortic valve is grossly normal. Aortic valve regurgitation is trivial.  8. The pulmonic valve was not well visualized. Pulmonic valve regurgitation is trivial.  9. The aortic root was not well visualized. 10. Mildly elevated pulmonary artery systolic pressure. 11. The atrial septum is grossly normal.  Antimicrobials: Ceftriaxone, azithromycin Remdesivir   Subjective: Feels again about the same as yesterday as far as moederate-severe dyspnea which is constant, improved with rest and oxygen, worsened by exertion. Having some chest pain with cough, not severe. Has converted to NSR.  Objective: Vitals:   06/05/19 1934 06/06/19 0011 06/06/19 0316 06/06/19 0801  BP: 139/79  129/72 134/75  Pulse:   89 91  Resp:   20 (!) 23  Temp: 97.7 F (36.5 C)  98 F (36.7 C) 97.6 F (36.4 C)  TempSrc: Oral  Oral Oral  SpO2:  92% 91% 90%  Weight:      Height:        Intake/Output Summary (Last 24 hours) at 06/06/2019 0857 Last data filed at 06/06/2019 0500 Gross per 24 hour  Intake -  Output 1400 ml  Net -1400 ml   Filed Weights   06/02/19 0445  Weight: 85.8 kg   Gen: 73 y.o. male in no distress Pulm: Nonlabored, mildly tachypneic but comfortable. Crackles bilaterally CV: Regular without murmur, rub, or gallop. No JVD, no dependent edema. GI: Abdomen soft, non-tender, non-distended, with normoactive bowel sounds.  Ext: Warm, no deformities Skin: No rashes, lesions or ulcers on visualized skin. Neuro: Alert and oriented. No focal neurological deficits. Psych: Judgement and insight appear fair. Mood euthymic & affect congruent. Behavior is appropriate.    Data Reviewed: I have personally reviewed following labs and imaging studies  CBC: Recent Labs  Lab 06/02/19 0740 06/03/19 0145 06/04/19 0215 06/05/19 0650 06/06/19 0435  WBC 9.1 8.4 7.1  6.5 9.2  NEUTROABS 8.2* 6.5 6.2 5.5 7.7  HGB 12.7* 13.2 13.7 13.0 13.7  HCT 38.7* 40.2 40.9 39.4 39.4  MCV 96.5 95.7 94.2 92.7 92.1  PLT 163 162 189 237 229   Basic Metabolic Panel: Recent Labs  Lab 06/02/19 0740 06/03/19 0145 06/04/19 0215 06/05/19 0650 06/06/19 0435  NA 134* 135 137 133* 132*  K 4.7 4.4 4.8 4.4 4.6  CL 99 97* 98 97* 98  CO2 _0 GLUCOSE 151* 61* 91 142* 237*  BUN 42* 38* 42* 43* 42*  CREATININE 1.56* 1.53* 1.50* 1.28* 1.32*  CALCIUM 8.3* 8.6* 8.7* 8.4* 8.6*  MG  --   --   --  2.0  --    GFR: Estimated Creatinine Clearance: 51.6 mL/min (A) (by C-G formula based on SCr of 1.32 mg/dL (H)).   Liver Function Tests: Recent Labs  Lab 06/02/19 0740 06/03/19 0145 06/04/19 0215 06/05/19 0650 06/06/19 0435  AST 45* 51* 45* 34 27  ALT 27 29 34 32 31  ALKPHOS 25* 25* 31* 31* 33*  BILITOT 0.7 0.9 1.3* 1.5* 1.0  PROT 7.0 7.0 7.1 6.7 6.4*  ALBUMIN 3.5 3.4* 3.6 3.3* 3.0*   CBG: Recent Labs  Lab 06/05/19 0730 06/05/19 1143 06/05/19 1707 06/05/19 2043 06/06/19 0712  GLUCAP 141* 202* 344* 347* 212*  Urine analysis:    Component Value Date/Time   COLORURINE YELLOW (A) 01/05/2016 0408   APPEARANCEUR CLEAR (A) 01/05/2016 0408   LABSPEC 1.020 01/05/2016 0408   PHURINE 5.0 01/05/2016 0408   GLUCOSEU 50 (A) 01/05/2016 0408   HGBUR NEGATIVE 01/05/2016 0408   BILIRUBINUR NEGATIVE 01/05/2016 0408   KETONESUR NEGATIVE 01/05/2016 0408   PROTEINUR 100 (A) 01/05/2016 0408   NITRITE NEGATIVE 01/05/2016 0408   LEUKOCYTESUR NEGATIVE 01/05/2016 0408   Recent Results (from the past 240 hour(s))  SARS CORONAVIRUS 2 (TAT 6-24 HRS) Nasopharyngeal Nasopharyngeal Swab     Status: None   Collection Time: 05/31/19  5:34 AM   Specimen: Nasopharyngeal Swab  Result Value Ref Range Status   SARS Coronavirus 2 NEGATIVE NEGATIVE Final    Comment: (NOTE) SARS-CoV-2 target nucleic acids are NOT DETECTED. The SARS-CoV-2 RNA is generally detectable in upper and  lower respiratory specimens during the acute phase of infection. Negative results do not preclude SARS-CoV-2 infection, do not rule out co-infections with other pathogens, and should not be used as the sole basis for treatment or other patient management decisions. Negative results must be combined with clinical observations, patient history, and epidemiological information. The expected result is Negative. Fact Sheet for Patients: SugarRoll.be Fact Sheet for Healthcare Providers: https://www.woods-mathews.com/ This test is not yet approved or cleared by the [place] FDA and  has been authorized for detection and/or diagnosis of SARS-CoV-2 by FDA under an Emergency Use Authorization (EUA). This EUA will remain  in effect (meaning this test can be used) for the duration of the COVID-19 declaration under Section 56 4(b)(1) of the Act, 21 U.S.C. section 360bbb-3(b)(1), unless the authorization is terminated or revoked sooner. Performed at [facility], [place] [address], [place], [place] [address]   CULTURE, BLOOD (ROUTINE X 2) w Reflex to ID Panel     Status: None   Collection Time: 05/31/19  2:25 PM   Specimen: BLOOD  Result Value Ref Range Status   Specimen Description BLOOD LEFT ANTECUBITAL  Final   Special Requests   Final    BOTTLES DRAWN AEROBIC AND ANAEROBIC Blood Culture adequate volume   Culture   Final    NO GROWTH 5 DAYS Performed at [facility], [address] [place], [place] [address]    Report Status 06/05/2019 FINAL  Final  CULTURE, BLOOD (ROUTINE X 2) w Reflex to ID Panel     Status: None   Collection Time: 05/31/19  2:25 PM   Specimen: BLOOD  Result Value Ref Range Status   Specimen Description BLOOD BLOOD LEFT HAND  Final   Special Requests   Final    BOTTLES DRAWN AEROBIC AND ANAEROBIC Blood Culture adequate volume   Culture   Final    NO GROWTH 5 DAYS Performed at [facility], [address] [place],  [address]    Report Status 06/05/2019 FINAL  Final      Radiology Studies: No results found.  Scheduled Meds: . albuterol  2 puff Inhalation Q6H  . allopurinol  100 mg Oral Daily  . atorvastatin  20 mg Oral q1800  . dabigatran  150 mg Oral BID  . dexamethasone  6 mg Oral Q12H  . diltiazem  60 mg Oral Q12H  . fluticasone furoate-vilanterol  1 puff Inhalation Daily  . furosemide  20 mg Oral Daily  . insulin aspart  0-15 Units Subcutaneous TID WC  . insulin aspart  0-5 Units Subcutaneous QHS  .  insulin glargine  50 Units Subcutaneous QHS  . metoprolol tartrate  100 mg Oral BID  . pantoprazole  40 mg Oral Daily  . tamsulosin  0.4 mg Oral QHS  . umeclidinium bromide  1 puff Inhalation Daily   Continuous Infusions: . sodium chloride Stopped (06/03/19 0934)     LOS: 4 days   Time spent: 35 minutes.  Little Ishikawa, DO [facility] [email or web address] 06/06/2019, 8:57 AM

## 2019-06-07 LAB — CBC WITH DIFFERENTIAL/PLATELET
Abs Immature Granulocytes: 0.07 10*3/uL (ref 0.00–0.07)
Basophils Absolute: 0 10*3/uL (ref 0.0–0.1)
Basophils Relative: 0 %
Eosinophils Absolute: 0 10*3/uL (ref 0.0–0.5)
Eosinophils Relative: 0 %
HCT: 38.4 % — ABNORMAL LOW (ref 39.0–52.0)
Hemoglobin: 13 g/dL (ref 13.0–17.0)
Immature Granulocytes: 1 %
Lymphocytes Relative: 8 %
Lymphs Abs: 0.9 10*3/uL (ref 0.7–4.0)
MCH: 31 pg (ref 26.0–34.0)
MCHC: 33.9 g/dL (ref 30.0–36.0)
MCV: 91.4 fL (ref 80.0–100.0)
Monocytes Absolute: 0.8 10*3/uL (ref 0.1–1.0)
Monocytes Relative: 7 %
Neutro Abs: 9.5 10*3/uL — ABNORMAL HIGH (ref 1.7–7.7)
Neutrophils Relative %: 84 %
Platelets: 335 10*3/uL (ref 150–400)
RBC: 4.2 MIL/uL — ABNORMAL LOW (ref 4.22–5.81)
RDW: 13.6 % (ref 11.5–15.5)
WBC: 11.3 10*3/uL — ABNORMAL HIGH (ref 4.0–10.5)
nRBC: 0 % (ref 0.0–0.2)

## 2019-06-07 LAB — COMPREHENSIVE METABOLIC PANEL
ALT: 33 U/L (ref 0–44)
AST: 25 U/L (ref 15–41)
Albumin: 3.1 g/dL — ABNORMAL LOW (ref 3.5–5.0)
Alkaline Phosphatase: 33 U/L — ABNORMAL LOW (ref 38–126)
Anion gap: 10 (ref 5–15)
BUN: 44 mg/dL — ABNORMAL HIGH (ref 8–23)
CO2: 23 mmol/L (ref 22–32)
Calcium: 8.3 mg/dL — ABNORMAL LOW (ref 8.9–10.3)
Chloride: 94 mmol/L — ABNORMAL LOW (ref 98–111)
Creatinine, Ser: 1.42 mg/dL — ABNORMAL HIGH (ref 0.61–1.24)
GFR calc Af Amer: 56 mL/min — ABNORMAL LOW (ref >60)
GFR calc non Af Amer: 48 mL/min — ABNORMAL LOW (ref >60)
Glucose, Bld: 227 mg/dL — ABNORMAL HIGH (ref 70–99)
Potassium: 4.8 mmol/L (ref 3.5–5.1)
Sodium: 127 mmol/L — ABNORMAL LOW (ref 135–145)
Total Bilirubin: 1.6 mg/dL — ABNORMAL HIGH (ref 0.3–1.2)
Total Protein: 6.3 g/dL — ABNORMAL LOW (ref 6.5–8.1)

## 2019-06-07 LAB — GLUCOSE, CAPILLARY
Glucose-Capillary: 211 mg/dL — ABNORMAL HIGH (ref 70–99)
Glucose-Capillary: 274 mg/dL — ABNORMAL HIGH (ref 70–99)
Glucose-Capillary: 277 mg/dL — ABNORMAL HIGH (ref 70–99)
Glucose-Capillary: 254 mg/dL — ABNORMAL HIGH (ref 70–99)

## 2019-06-07 LAB — C-REACTIVE PROTEIN: CRP: 1.2 mg/dL — ABNORMAL HIGH (ref <1.0)

## 2019-06-07 LAB — D-DIMER, QUANTITATIVE: D-Dimer, Quant: 1.1 ug/mL-FEU — ABNORMAL HIGH (ref 0.00–0.50)

## 2019-06-07 MED ORDER — FLEET ENEMA 7-19 GM/118ML RE ENEM
1.0000 | ENEMA | Freq: Once | RECTAL | Status: AC
  Administered 2019-06-07: 1 via RECTAL
  Filled 2019-06-07: qty 1

## 2019-06-07 MED ORDER — MAGNESIUM CITRATE PO SOLN
1.0000 | Freq: Once | ORAL | Status: AC
  Administered 2019-06-07: 1 via ORAL
  Filled 2019-06-07: qty 296

## 2019-06-07 MED ORDER — POLYETHYLENE GLYCOL 3350 17 G PO PACK
17.0000 g | PACK | ORAL | Status: AC
  Administered 2019-06-07: 17 g via ORAL
  Filled 2019-06-07 (×2): qty 1

## 2019-06-07 NOTE — Progress Notes (Signed)
Inpatient Diabetes Program Recommendations  AACE/ADA: New Consensus Statement on Inpatient Glycemic Control (2015)  Target Ranges:  Prepandial:   less than 140 mg/dL      Peak postprandial:   less than 180 mg/dL (1-2 hours)      Critically ill patients:  140 - 180 mg/dL   Lab Results  Component Value Date   GLUCAP 274 (H) 06/07/2019   HGBA1C 7.5 (H) 05/31/2019    Review of Glycemic Control Results for RENLEY, BANWART (MRN [id number]) as of 06/07/2019 12:09  Ref. Range 06/06/2019 11:40 06/06/2019 16:50 06/06/2019 21:04 06/07/2019 07:38 06/07/2019 11:36  Glucose-Capillary Latest Ref Range: 70 - 99 mg/dL 333 (H) 323 (H) 369 (H) 211 (H) 274 (H)   History:DM, COPD   Home DM Meds: Tresiba 120 units QHS Novolog 30 units TID Metformin 1000 mgBID  Current orders for Inpatient glycemic control: Lantus 50 units + Novolog moderate correction tid + hs  Inpatient Diabetes Program Recommendations:    -Increase Lantus to 70 units daily -Add Novolog 10 units tid meal coverage if patient eats 50%  Secure message sent to Dr. Avon Gully with recommendations.  Thank you, Nani Gasser. Advika Mclelland, RN, MSN, CDE  Diabetes Coordinator Inpatient Glycemic Control Team Team Pager [phone] (8am-5pm) 06/07/2019 12:10 PM

## 2019-06-07 NOTE — Progress Notes (Signed)
PROGRESS NOTE  Jay Morris  YIA:[id number] DOB: [birth year] DOA: 06/02/2019 PCP: [name], MD   Brief Narrative: Jay Morris is a 73 y.o. male with a history of COPD, asthma, PAF on pradaxa, IDT2DM, and HTN who presented to [facility] 11/12 with fever, chills, and worsening shortness of breath. He had presented to his PCP 11/11 and was tested for covid which was pending. In the ED he was hypoxic with CXR showing increased interstitial markings, ECG NSR. He was given ceftriaxone, azithromycin and admitted to [facility] with an in-house test being negative for SARS-CoV-2. After admission, the initial covid test resulted positive, so he was given remdesivir, steroids, and admitted at [facility]. CRP and LDH were elevated though PCT and d-dimer were normal. Hospitalization has been complicated by progressing hypoxia for which convalescent plasma was given and AFib with RVR for which metoprolol has been increased and diltiazem added. Hypoxia requiring 15L HFNC on 11/17.  Assessment & Plan: Principal Problem:   COVID-19 virus infection Active Problems:   Acute respiratory failure with hypoxia (HCC)   Paroxysmal atrial fibrillation with RVR ([place])   Uncontrolled type 2 diabetes mellitus with hyperglycemia, with long-term current use of insulin (HCC)   Hyperlipidemia   Degeneration of lumbar intervertebral disc   COPD, moderate (HCC)   Acute renal failure superimposed on stage 3a chronic kidney disease (HCC)   COPD with acute exacerbation (HCC)   PVD (peripheral vascular disease) (HCC)   Hepatic steatosis   Acute hypoxic respiratory failure due to acute covid-19 pneumonia, POA:  - No wheezing, BNP 37. D-dimer below age-adjusted cutoff and V/Q scan revealed low probability of PE. DC'ed antibiotics with negative PCT.  - Completed remdesivir 11/17 - Augmented steroids given abrupt increase in CRP. CRP now declining. - S/P CCP 11/15.  - D-dimer slightly up but pt on therapeutic anticoagulation.    Constipation, rule out ileus - ongoing - Repeat miralax, magcitrate, fleets enema - Flat plat in am if no resolution  PAF with RVR:  Improving control. Not hypotensive. Back to NSR 11/17. - Increased dose of metoprolol further to 160m po BID, added diltiazem CR 620mq12h with improvement. - Continue pradaxa  AKI on stage IIIa CKD: Continues to improve, back near baseline. - Restarted home lasix 2026mo daily, Cr still improving. - Trend, avoid nephrotoxins.  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  - Continue statin - Outpatient follow up.  Uncontrolled IDT2DM with hyperglycemia: Insulin since 2004 and very resistant based on requirements at home. HbA1c 7.5%. Confirmed he takes 110u qHS and 30u qAM, 30u qLunch, and 40u qDinner.  Continue basal, bolus insulin as ordered  HTN:  - Continue metoprolol and lasix.  Nausea, vomiting:  - Resolved. CT abd/pelvis without acute etiology.   Hepatic steatosis:  - Noted on CT.  - Monitor ALT (remains normal), especially w/remdesivir Tx.   AST elevation, resolved:  - In isolation, unclear significance.  - Continue to monitor.  Lumbar DDD:  - Continue tylenol prn and add tramadol prn mod/severe pain. Pt aware that mobility is important.  DVT prophylaxis: Pradaxa Code Status: Full Family Communication: Sister by phone previously Disposition Plan: Uncertain, may require transfer to ICU for [facility]:   None  Procedures:   Echocardiogram 05/31/2019: 1. Left ventricular ejection fraction, by visual estimation, is 60 to 65%. The left ventricle has normal function. Left ventricular septal wall thickness was mildly increased. Mildly increased left ventricular posterior wall thickness. There is  mildly  increased left ventricular hypertrophy.  2. Global right ventricle has normal systolic function.The right ventricular size is mildly enlarged. No increase in right ventricular  wall thickness.  3. Left atrial size was normal.  4. Right atrial size was mildly dilated.  5. The mitral valve was not well visualized. Trace mitral valve regurgitation.  6. The tricuspid valve is not well visualized. Tricuspid valve regurgitation is mild.  7. The aortic valve is grossly normal. Aortic valve regurgitation is trivial.  8. The pulmonic valve was not well visualized. Pulmonic valve regurgitation is trivial.  9. The aortic root was not well visualized. 10. Mildly elevated pulmonary artery systolic pressure. 11. The atrial septum is grossly normal.  Antimicrobials: Ceftriaxone, azithromycin Remdesivir   Subjective: Feels somewhat improved since yesterday. Ongoing constipation  Objective: Vitals:   06/06/19 2217 06/07/19 0000 06/07/19 0407 06/07/19 0808  BP:  122/75 131/75 125/64  Pulse:  89 87 88  Resp:  (!) _0 Temp:  (!) 97.4 F (36.3 C) 97.8 F (36.6 C) (!) 97.4 F (36.3 C)  TempSrc:  Oral Oral Oral  SpO2: 100% 96% 96% 90%  Weight:      Height:        Intake/Output Summary (Last 24 hours) at 06/07/2019 0839 Last data filed at 06/07/2019 0800 Gross per 24 hour  Intake -  Output 2175 ml  Net -2175 ml   Filed Weights   06/02/19 0445  Weight: 85.8 kg   Gen: 73 y.o. male in no distress Pulm: Nonlabored, mildly tachypneic but comfortable. Crackles bilaterally CV: Regular without murmur, rub, or gallop. No JVD, no dependent edema. GI: Abdomen soft, non-tender, non-distended, with normoactive bowel sounds.  Ext: Warm, no deformities Skin: No rashes, lesions or ulcers on visualized skin. Neuro: Alert and oriented. No focal neurological deficits.  Data Reviewed: I have personally reviewed following labs and imaging studies  CBC: Recent Labs  Lab 06/03/19 0145 06/04/19 0215 06/05/19 0650 06/06/19 0435 06/07/19 0330  WBC 8.4 7.1 6.5 9.2 11.3*  NEUTROABS 6.5 6.2 5.5 7.7 9.5*  HGB 13.2 13.7 13.0 13.7 13.0  HCT 40.2 40.9 39.4 39.4 38.4*  MCV  95.7 94.2 92.7 92.1 91.4  PLT 162 189 237 289 962   Basic Metabolic Panel: Recent Labs  Lab 06/03/19 0145 06/04/19 0215 06/05/19 0650 06/06/19 0435 06/07/19 0330  NA 135 137 133* 132* 127*  K 4.4 4.8 4.4 4.6 4.8  CL 97* 98 97* 98 94*  CO2 _1 GLUCOSE 61* 91 142* 237* 227*  BUN 38* 42* 43* 42* 44*  CREATININE 1.53* 1.50* 1.28* 1.32* 1.42*  CALCIUM 8.6* 8.7* 8.4* 8.6* 8.3*  MG  --   --  2.0  --   --    GFR: Estimated Creatinine Clearance: 47.9 mL/min (A) (by C-G formula based on SCr of 1.42 mg/dL (H)).   Liver Function Tests: Recent Labs  Lab 06/03/19 0145 06/04/19 0215 06/05/19 0650 06/06/19 0435 06/07/19 0330  AST 51* 45* 34 27 25  ALT 29 34 32 31 33  ALKPHOS 25* 31* 31* 33* 33*  BILITOT 0.9 1.3* 1.5* 1.0 1.6*  PROT 7.0 7.1 6.7 6.4* 6.3*  ALBUMIN 3.4* 3.6 3.3* 3.0* 3.1*   CBG: Recent Labs  Lab 06/06/19 0712 06/06/19 1140 06/06/19 1650 06/06/19 2104 06/07/19 0738  GLUCAP 212* 333* 323* 369* 211*   Urine analysis:    Component Value Date/Time   COLORURINE YELLOW (A) 01/05/2016 0408   APPEARANCEUR CLEAR (A)  01/05/2016 0408   LABSPEC 1.020 01/05/2016 0408   PHURINE 5.0 01/05/2016 0408   GLUCOSEU 50 (A) 01/05/2016 0408   HGBUR NEGATIVE 01/05/2016 0408   BILIRUBINUR NEGATIVE 01/05/2016 0408   KETONESUR NEGATIVE 01/05/2016 0408   PROTEINUR 100 (A) 01/05/2016 0408   NITRITE NEGATIVE 01/05/2016 0408   LEUKOCYTESUR NEGATIVE 01/05/2016 0408   Recent Results (from the past 240 hour(s))  SARS CORONAVIRUS 2 (TAT 6-24 HRS) Nasopharyngeal Nasopharyngeal Swab     Status: None   Collection Time: 05/31/19  5:34 AM   Specimen: Nasopharyngeal Swab  Result Value Ref Range Status   SARS Coronavirus 2 NEGATIVE NEGATIVE Final    Comment: (NOTE) SARS-CoV-2 target nucleic acids are NOT DETECTED. The SARS-CoV-2 RNA is generally detectable in upper and lower respiratory specimens during the acute phase of infection. Negative results do not preclude SARS-CoV-2  infection, do not rule out co-infections with other pathogens, and should not be used as the sole basis for treatment or other patient management decisions. Negative results must be combined with clinical observations, patient history, and epidemiological information. The expected result is Negative. Fact Sheet for Patients: SugarRoll.be Fact Sheet for Healthcare Providers: https://www.woods-mathews.com/ This test is not yet approved or cleared by the [place] FDA and  has been authorized for detection and/or diagnosis of SARS-CoV-2 by FDA under an Emergency Use Authorization (EUA). This EUA will remain  in effect (meaning this test can be used) for the duration of the COVID-19 declaration under Section 56 4(b)(1) of the Act, 21 U.S.C. section 360bbb-3(b)(1), unless the authorization is terminated or revoked sooner. Performed at [facility], [place] [address], [place], [place] [address]   CULTURE, BLOOD (ROUTINE X 2) w Reflex to ID Panel     Status: None   Collection Time: 05/31/19  2:25 PM   Specimen: BLOOD  Result Value Ref Range Status   Specimen Description BLOOD LEFT ANTECUBITAL  Final   Special Requests   Final    BOTTLES DRAWN AEROBIC AND ANAEROBIC Blood Culture adequate volume   Culture   Final    NO GROWTH 5 DAYS Performed at [facility], [place] [address] [place], [place] [address]    Report Status 06/05/2019 FINAL  Final  CULTURE, BLOOD (ROUTINE X 2) w Reflex to ID Panel     Status: None   Collection Time: 05/31/19  2:25 PM   Specimen: BLOOD  Result Value Ref Range Status   Specimen Description BLOOD BLOOD LEFT HAND  Final   Special Requests   Final    BOTTLES DRAWN AEROBIC AND ANAEROBIC Blood Culture adequate volume   Culture   Final    NO GROWTH 5 DAYS Performed at [facility], [address] [place], [place] [address]    Report Status 06/05/2019 FINAL  Final      Radiology Studies: No  results found.  Scheduled Meds: . albuterol  2 puff Inhalation Q6H  . allopurinol  100 mg Oral Daily  . atorvastatin  20 mg Oral q1800  . dabigatran  150 mg Oral BID  . dexamethasone  6 mg Oral Q12H  . diltiazem  60 mg Oral Q12H  . fluticasone furoate-vilanterol  1 puff Inhalation Daily  . furosemide  20 mg Oral Daily  . insulin aspart  0-15 Units Subcutaneous TID WC  . insulin aspart  0-5 Units Subcutaneous QHS  . insulin glargine  50 Units Subcutaneous QHS  . metoprolol tartrate  100 mg Oral BID  . pantoprazole  40 mg Oral Daily  . polyethylene glycol  34 g Oral BID  . tamsulosin  0.4 mg Oral QHS  . umeclidinium bromide  1 puff Inhalation Daily   Continuous Infusions: . sodium chloride Stopped (06/03/19 0934)     LOS: 5 days   Time spent: 35 minutes.  Little Ishikawa, DO [facility] [email or web address] 06/07/2019, 8:39 AM

## 2019-06-08 ENCOUNTER — Inpatient Hospital Stay (HOSPITAL_COMMUNITY): Payer: Medicare HMO

## 2019-06-08 LAB — GLUCOSE, CAPILLARY
Glucose-Capillary: 170 mg/dL — ABNORMAL HIGH (ref 70–99)
Glucose-Capillary: 228 mg/dL — ABNORMAL HIGH (ref 70–99)
Glucose-Capillary: 187 mg/dL — ABNORMAL HIGH (ref 70–99)
Glucose-Capillary: 290 mg/dL — ABNORMAL HIGH (ref 70–99)

## 2019-06-08 MED ORDER — SALINE SPRAY 0.65 % NA SOLN
1.0000 | NASAL | Status: DC | PRN
  Administered 2019-06-13 – 2019-06-15 (×2): 1 via NASAL
  Filled 2019-06-08: qty 44

## 2019-06-08 MED ORDER — INSULIN GLARGINE 100 UNIT/ML ~~LOC~~ SOLN
60.0000 [IU] | Freq: Every day | SUBCUTANEOUS | Status: DC
  Administered 2019-06-08 – 2019-06-11 (×4): 60 [IU] via SUBCUTANEOUS
  Filled 2019-06-08 (×5): qty 0.6

## 2019-06-08 NOTE — Evaluation (Signed)
Physical Therapy RE-Evaluation Patient Details Name: Jay Morris MRN: [id number] DOB: 03-Oct-1945 Today's Date: 06/08/2019   History of Present Illness  From MD H&P: Pt is a 73 y.o. male with a known history of asthma, atrial fibrillation, COPD, type 2 diabetes mellitus, hypertension and dyslipidemia, presented to the emergency room with acute onset of 2-day history of fever with a T-max of 103 and chills with associated body aching, generalized weakness, fatigue and malaise.  He admitted to dyspnea with paroxysmal nocturnal dyspnea and dyspnea on exertion without orthopnea or lower extremity edema.  He admitted to nausea and vomiting as well as watery diarrhea.  He admitted to mild epigastric and infraumbilical abdominal pain.  He lost about 10 pounds during the last 4 days.  He had mild dizziness without headache or diplopia.  Upon presentation to the emergency room, temperature was 99.3 respiratory to 25, heart rate 82, blood pressure 150/66 and pulse oximetry 91% on room air dropped to 82% upon ambulation for short distance.  CBC showed anemia with hemoglobin of 12.5 hematocrit of 37.1 better than his previous levels and very close to his baseline.  For which x-ray showed mild increase interstitial markings which could be due to interstitial pulmonary edema.  He will be admitted to a medical monitored bed for further evaluation and management.  MD assessment includes: Acute kidney injury superimposed on chronic kidney disease stage IIIa, N&V, diarrhea, DM II with hyperglycemia, A-fib, and general weakness.  Clinical Impression   Pt admitted with above diagnosis, was evaluated on 11/13 at [facility] prior to transfer to [facility]. Pt currently with functional limitations due to the deficits listed below (see PT Problem List). Presents with decreased strength, balance and coordination, independence, activity tolerance. Pt was able to get in/out bed with mod I, needed SBA-min guard assist for transfes and min-min  guard assist with ambulation in room up to 10f. Pt reports feeling strange when getting up, describes it as feeling like he will throw up. With ambulation noted desaturation to 85% and increased work of breathing, with rest and cues for pursed lip breathing pt was able to recover within 1 minute. Pt was on 7L/min via HFNC while at rest increased to 8L/min via HFNC (portable tank does not have 7L). Pt will benefit from skilled PT to increase their independence and safety with mobility to allow discharge to the venue listed below.        Follow Up Recommendations SNF    Equipment Recommendations  Rolling walker with 5" wheels;Other (comment)    Recommendations for Other Services       Precautions / Restrictions Precautions Precautions: Fall Restrictions Weight Bearing Restrictions: No      Mobility  Bed Mobility Overal bed mobility: Modified Independent             General bed mobility comments: Extra time required with pt moderately fatigued from the effort of going sup to sit  Transfers Overall transfer level: Needs assistance Equipment used: 1 person hand held assist Transfers: Sit to/from Stand Sit to Stand: Min guard;From elevated surface            Ambulation/Gait Ambulation/Gait assistance: Min guard Gait Distance (Feet): 80 Feet Assistive device: 1 person hand held assist Gait Pattern/deviations: Step-through pattern;Decreased step length - right;Decreased step length - left     General Gait Details: 8L/min HFNC desat to min 85% but able to recover quickly  SScience writer  Modified Rankin (Stroke Patients Only)       Balance Overall balance assessment: Needs assistance Sitting-balance support: Feet unsupported Sitting balance-Leahy Scale: Good   Postural control: Other (comment)(none noted) Standing balance support: During functional activity Standing balance-Leahy Scale: Fair                                Pertinent Vitals/Pain Pain Assessment: No/denies pain(denies pain but states is feeling nauseus + strange when up)    Home Living Family/patient expects to be discharged to:: Private residence Living Arrangements: Spouse/significant other Available Help at Discharge: Family;Available 24 hours/day;Other (Comment) Type of Home: House Home Access: Ramped entrance     Home Layout: One level Home Equipment: Grab bars - toilet;Shower seat - built in      Prior Function Level of Independence: Independent         Comments: Ind with amb community distances without an AD, no fall history, Ind with ADLs     Hand Dominance        Extremity/Trunk Assessment        Lower Extremity Assessment Lower Extremity Assessment: Generalized weakness       Communication   Communication: No difficulties  Cognition Arousal/Alertness: Awake/alert Behavior During Therapy: WFL for tasks assessed/performed Overall Cognitive Status: Within Functional Limits for tasks assessed                                        General Comments      Exercises     Assessment/Plan    PT Assessment Patient needs continued PT services  PT Problem List Decreased strength;Decreased activity tolerance;Decreased balance;Decreased mobility;Decreased knowledge of use of DME       PT Treatment Interventions DME instruction;Gait training;Functional mobility training;Therapeutic activities;Therapeutic exercise;Balance training;Patient/family education    PT Goals (Current goals can be found in the Care Plan section)  Acute Rehab PT Goals Patient Stated Goal: be able to walk around more PT Goal Formulation: With patient Time For Goal Achievement: 06/22/19 Potential to Achieve Goals: Good    Frequency Min 2X/week   Barriers to discharge Decreased caregiver support      Co-evaluation               AM-PAC PT "6 Clicks" Mobility  Outcome Measure Help needed turning from  your back to your side while in a flat bed without using bedrails?: None Help needed moving from lying on your back to sitting on the side of a flat bed without using bedrails?: None Help needed moving to and from a bed to a chair (including a wheelchair)?: A Little Help needed standing up from a chair using your arms (e.g., wheelchair or bedside chair)?: A Little Help needed to walk in hospital room?: A Little Help needed climbing 3-5 steps with a railing? : A Lot 6 Click Score: 19    End of Session Equipment Utilized During Treatment: Oxygen Activity Tolerance: Patient limited by fatigue;Patient limited by lethargy;Treatment limited secondary to medical complications (Comment) Patient left: in bed;with call bell/phone within reach Nurse Communication: Mobility status PT Visit Diagnosis: Difficulty in walking, not elsewhere classified (R26.2);Muscle weakness (generalized) (M62.81)    Time: 8413-2440 PT Time Calculation (min) (ACUTE ONLY): 15 min   Charges:   PT Evaluation $PT Re-evaluation: 1 Re-eval          Korrine Sicard T  Breeona Waid, PT   Delford Field 06/08/2019, 4:17 PM

## 2019-06-08 NOTE — Progress Notes (Signed)
PROGRESS NOTE  Jay Morris  EAV:[id number] DOB: 05-Aug-1945 DOA: 06/02/2019 PCP: [name], MD   Brief Narrative: Jay Morris is a 73 y.o. male with a history of COPD, asthma, PAF on pradaxa, IDT2DM, and HTN who presented to [facility] 11/12 with fever, chills, and worsening shortness of breath. He had presented to his PCP 11/11 and was tested for covid which was pending. In the ED he was hypoxic with CXR showing increased interstitial markings, ECG NSR. He was given ceftriaxone, azithromycin and admitted to [facility] with an in-house test being negative for SARS-CoV-2. After admission, the initial covid test resulted positive, so he was given remdesivir, steroids, and admitted at [facility]. CRP and LDH were elevated though PCT and d-dimer were normal. Hospitalization has been complicated by progressing hypoxia for which convalescent plasma was given and AFib with RVR for which metoprolol has been increased and diltiazem added. Hypoxia requiring 15L HFNC on 11/17.  Assessment & Plan: Principal Problem:   COVID-19 virus infection Active Problems:   Acute respiratory failure with hypoxia (HCC)   Paroxysmal atrial fibrillation with RVR ([place])   Uncontrolled type 2 diabetes mellitus with hyperglycemia, with long-term current use of insulin (HCC)   Hyperlipidemia   Degeneration of lumbar intervertebral disc   COPD, moderate (HCC)   Acute renal failure superimposed on stage 3a chronic kidney disease (HCC)   COPD with acute exacerbation (HCC)   PVD (peripheral vascular disease) ([place])   Hepatic steatosis   Acute hypoxic respiratory failure due to acute covid-19 pneumonia, POA: - Covid+ prior to admit - results 05/30/19 - No wheezing, BNP 37. D-dimer below age-adjusted cutoff and V/Q scan revealed low probability of PE. DC'ed antibiotics with negative PCT.  - Completed remdesivir 11/17 - Steroids given abrupt increase in CRP. CRP now declining. - S/P CCP 11/15.  - D-dimer slightly up but pt on  therapeutic anticoagulation. SpO2: (!) 87 % O2 Flow Rate (L/min): 7 L/min Recent Labs    06/06/19 0435 06/07/19 0330  DDIMER 1.59* 1.10*  CRP 2.0* 1.2*   Lab Results  Component Value Date   SARSCOV2NAA NEGATIVE 05/31/2019   Constipation, resolved Ileus ruled out - resolving on new medications - continue PRNs - Flat plat unremarkable  PAF with RVR:  - Improving control. Not hypotensive. Back to NSR 11/17. - Continue metoprolol at 172m po BID, added diltiazem CR [id number] with improvement - Continue pradaxa  AKI on stage IIIa CKD, POA:  - Continues to improve, back near baseline. - Restarted home lasix 2086mo daily, Cr still improving. - Trend, avoid nephrotoxins.  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  - Continue statin - Outpatient follow up  Uncontrolled IDT2DM with hyperglycemia: Insulin dependent since 2004 and very resistant based on requirements at home.  HbA1c 7.5%.  Home meds include 110u qHS and 30u qAM, 30u qLunch, and 40u qDinner.  Continue to increase patient insulin as necessary; increase Lantus to 60u  HTN:  - Continue metoprolol and lasix.  Nausea, vomiting:  - Resolved. CT abd/pelvis without acute etiology.   Hepatic steatosis:  - Noted on CT.  - Monitor ALT (remains normal), especially w/remdesivir Tx.   AST elevation, resolved:  - In isolation, unclear significance.  - Continue to monitor.  Lumbar DDD:  - Continue tylenol prn and add tramadol prn mod/severe pain. Pt aware that mobility is important.  DVT prophylaxis: Pradaxa Code Status: Full Family Communication: Sister by phone previously Disposition Plan:  Uncertain, may require transfer to ICU for [facility]  Consultants:   None  Procedures:   Echocardiogram 05/31/2019: 1. Left ventricular ejection fraction, by visual estimation, is 60 to 65%. The left ventricle has normal function. Left ventricular septal wall thickness was  mildly increased. Mildly increased left ventricular posterior wall thickness. There is mildly  increased left ventricular hypertrophy.  2. Global right ventricle has normal systolic function.The right ventricular size is mildly enlarged. No increase in right ventricular wall thickness.  3. Left atrial size was normal.  4. Right atrial size was mildly dilated.  5. The mitral valve was not well visualized. Trace mitral valve regurgitation.  6. The tricuspid valve is not well visualized. Tricuspid valve regurgitation is mild.  7. The aortic valve is grossly normal. Aortic valve regurgitation is trivial.  8. The pulmonic valve was not well visualized. Pulmonic valve regurgitation is trivial.  9. The aortic root was not well visualized. 10. Mildly elevated pulmonary artery systolic pressure. 11. The atrial septum is grossly normal.  Antimicrobials: Ceftriaxone, azithromycin Remdesivir   Subjective: Feels somewhat improved since yesterday. Ongoing constipation  Objective: Vitals:   06/08/19 0559 06/08/19 0600 06/08/19 0756 06/08/19 1042  BP:      Pulse: 88 87 85 82  Resp: (!) _0 (!) 26  Temp:   98.1 F (36.7 C)   TempSrc:   Oral   SpO2:   93% (!) 87%  Weight:      Height:        Intake/Output Summary (Last 24 hours) at 06/08/2019 1605 Last data filed at 06/07/2019 2238 Gross per 24 hour  Intake --  Output 100 ml  Net -100 ml   Filed Weights   06/02/19 0445  Weight: 85.8 kg   Gen: 73 y.o. male in no distress Pulm: Nonlabored, mildly tachypneic but comfortable. Crackles bilaterally CV: Regular without murmur, rub, or gallop. No JVD, no dependent edema. GI: Abdomen soft, non-tender, non-distended, with normoactive bowel sounds.  Ext: Warm, no deformities Skin: No rashes, lesions or ulcers on visualized skin. Neuro: Alert and oriented. No focal neurological deficits.  Data Reviewed: I have personally reviewed following labs and imaging studies  CBC: Recent Labs   Lab 06/03/19 0145 06/04/19 0215 06/05/19 0650 06/06/19 0435 06/07/19 0330  WBC 8.4 7.1 6.5 9.2 11.3*  NEUTROABS 6.5 6.2 5.5 7.7 9.5*  HGB 13.2 13.7 13.0 13.7 13.0  HCT 40.2 40.9 39.4 39.4 38.4*  MCV 95.7 94.2 92.7 92.1 91.4  PLT 162 189 237 289 353   Basic Metabolic Panel: Recent Labs  Lab 06/03/19 0145 06/04/19 0215 06/05/19 0650 06/06/19 0435 06/07/19 0330  NA 135 137 133* 132* 127*  K 4.4 4.8 4.4 4.6 4.8  CL 97* 98 97* 98 94*  CO2 _1 GLUCOSE 61* 91 142* 237* 227*  BUN 38* 42* 43* 42* 44*  CREATININE 1.53* 1.50* 1.28* 1.32* 1.42*  CALCIUM 8.6* 8.7* 8.4* 8.6* 8.3*  MG  --   --  2.0  --   --    GFR: Estimated Creatinine Clearance: 47.9 mL/min (A) (by C-G formula based on SCr of 1.42 mg/dL (H)).   Liver Function Tests: Recent Labs  Lab 06/03/19 0145 06/04/19 0215 06/05/19 0650 06/06/19 0435 06/07/19 0330  AST 51* 45* 34 27 25  ALT 29 34 32 31 33  ALKPHOS 25* 31* 31* 33* 33*  BILITOT 0.9 1.3* 1.5* 1.0 1.6*  PROT 7.0 7.1 6.7 6.4* 6.3*  ALBUMIN 3.4* 3.6 3.3*  3.0* 3.1*   CBG: Recent Labs  Lab 06/07/19 1136 06/07/19 1603 06/07/19 2246 06/08/19 0758 06/08/19 1117  GLUCAP 274* 277* 254* 170* 228*   Urine analysis:    Component Value Date/Time   COLORURINE YELLOW (A) 01/05/2016 0408   APPEARANCEUR CLEAR (A) 01/05/2016 0408   LABSPEC 1.020 01/05/2016 0408   PHURINE 5.0 01/05/2016 0408   GLUCOSEU 50 (A) 01/05/2016 0408   HGBUR NEGATIVE 01/05/2016 0408   BILIRUBINUR NEGATIVE 01/05/2016 0408   KETONESUR NEGATIVE 01/05/2016 0408   PROTEINUR 100 (A) 01/05/2016 0408   NITRITE NEGATIVE 01/05/2016 0408   LEUKOCYTESUR NEGATIVE 01/05/2016 0408   Recent Results (from the past 240 hour(s))  SARS CORONAVIRUS 2 (TAT 6-24 HRS) Nasopharyngeal Nasopharyngeal Swab     Status: None   Collection Time: 05/31/19  5:34 AM   Specimen: Nasopharyngeal Swab  Result Value Ref Range Status   SARS Coronavirus 2 NEGATIVE NEGATIVE Final    Comment:  (NOTE) SARS-CoV-2 target nucleic acids are NOT DETECTED. The SARS-CoV-2 RNA is generally detectable in upper and lower respiratory specimens during the acute phase of infection. Negative results do not preclude SARS-CoV-2 infection, do not rule out co-infections with other pathogens, and should not be used as the sole basis for treatment or other patient management decisions. Negative results must be combined with clinical observations, patient history, and epidemiological information. The expected result is Negative. Fact Sheet for Patients: SugarRoll.be Fact Sheet for Healthcare Providers: https://www.woods-mathews.com/ This test is not yet approved or cleared by the [place] FDA and  has been authorized for detection and/or diagnosis of SARS-CoV-2 by FDA under an Emergency Use Authorization (EUA). This EUA will remain  in effect (meaning this test can be used) for the duration of the COVID-19 declaration under Section 56 4(b)(1) of the Act, 21 U.S.C. section 360bbb-3(b)(1), unless the authorization is terminated or revoked sooner. Performed at [facility], [place] [address], [place], [place] [address]   CULTURE, BLOOD (ROUTINE X 2) w Reflex to ID Panel     Status: None   Collection Time: 05/31/19  2:25 PM   Specimen: BLOOD  Result Value Ref Range Status   Specimen Description BLOOD LEFT ANTECUBITAL  Final   Special Requests   Final    BOTTLES DRAWN AEROBIC AND ANAEROBIC Blood Culture adequate volume   Culture   Final    NO GROWTH 5 DAYS Performed at [facility], [address] [place], [place] [address]    Report Status 06/05/2019 FINAL  Final  CULTURE, BLOOD (ROUTINE X 2) w Reflex to ID Panel     Status: None   Collection Time: 05/31/19  2:25 PM   Specimen: BLOOD  Result Value Ref Range Status   Specimen Description BLOOD BLOOD LEFT HAND  Final   Special Requests   Final    BOTTLES DRAWN AEROBIC AND ANAEROBIC  Blood Culture adequate volume   Culture   Final    NO GROWTH 5 DAYS Performed at [facility], [address], [place], [place] [address]    Report Status 06/05/2019 FINAL  Final      Radiology Studies: Dg Abd 1 View  Result Date: 06/08/2019 CLINICAL DATA:  Constipation EXAM: ABDOMEN - 1 VIEW COMPARISON:  CT abdomen pelvis-05/31/2019; chest radiograph-earlier same day FINDINGS: Moderate gaseous distention of the colon without upstream distension of the small bowel. Unremarkable colonic stool burden. Nondiagnostic evaluation for pneumoperitoneum secondary to supine positioning and exclusion of the lower thorax. No pneumatosis or portal venous  gas. No definitive abnormal intra-abdominal calcifications. Degenerative change the lower lumbar spine and bilateral hips is suspected though incompletely evaluated. IMPRESSION: 1. Mild gaseous distention of the colon without evidence of enteric obstruction. 2. Unremarkable colonic stool burden. Electronically Signed   By: Sandi Mariscal M.D.   On: 06/08/2019 08:33    Scheduled Meds:  albuterol  2 puff Inhalation Q6H   allopurinol  100 mg Oral Daily   atorvastatin  20 mg Oral q1800   dabigatran  150 mg Oral BID   dexamethasone  6 mg Oral Q12H   diltiazem  60 mg Oral Q12H   fluticasone furoate-vilanterol  1 puff Inhalation Daily   furosemide  20 mg Oral Daily   insulin aspart  0-15 Units Subcutaneous TID WC   insulin aspart  0-5 Units Subcutaneous QHS   insulin glargine  60 Units Subcutaneous QHS   metoprolol tartrate  100 mg Oral BID   pantoprazole  40 mg Oral Daily   polyethylene glycol  34 g Oral BID   tamsulosin  0.4 mg Oral QHS   umeclidinium bromide  1 puff Inhalation Daily   Continuous Infusions:  sodium chloride Stopped (06/03/19 0934)     LOS: 6 days   Time spent: 35 minutes.  Little Ishikawa, DO [facility] [email or web address] 06/08/2019, 4:05 PM

## 2019-06-09 LAB — GLUCOSE, CAPILLARY
Glucose-Capillary: 261 mg/dL — ABNORMAL HIGH (ref 70–99)
Glucose-Capillary: 288 mg/dL — ABNORMAL HIGH (ref 70–99)

## 2019-06-09 NOTE — Plan of Care (Addendum)
Talked with daughter to update who agreed to relay info to her mom.  Assited with facetime and discussed patient update, status and probable need for DC to SNF.  During conversation, daughter attempted to discuss time of contagiousness, getting a negative test before DC and that she plans to see dad as soon as he's discharged.  When discussing these items  - I attempted to educate on the discharge process and she was quick to indicate "that's not what the Dr. Michela Pitcher."  Bottom line, " I informed her that its very possible dad's body would NOT produce a "negative" covid test before DC.  Therefore, he may need to go to a covid SNF for now. Also informed that SW would be assisting in this process, to discuss options and the requirements for each facility.  Daughter also requested to make sure patient gets cleaned up/bath today.  *Given bath as requested and promised.  After the call, Dr. Avon Gully and I discussed the situation at hand. Just trying to make sure we're all on the same page. Dr. stated he would try to clarify our discussions when he talks to the daughter again tomorrow.

## 2019-06-09 NOTE — Progress Notes (Signed)
PROGRESS NOTE  Jay Morris  OHY:[id number] DOB: [birth year] DOA: 06/02/2019 PCP: [name], MD   Brief Narrative: Jay Morris is a 73 y.o. male with a history of COPD, asthma, PAF on pradaxa, IDT2DM, and HTN who presented to [facility] 11/12 with fever, chills, and worsening shortness of breath. He had presented to his PCP 11/11 and was tested for covid which was pending. In the ED he was hypoxic with CXR showing increased interstitial markings, ECG NSR. He was given ceftriaxone, azithromycin and admitted to [facility] with an in-house test being negative for SARS-CoV-2. After admission, the initial covid test resulted positive, so he was given remdesivir, steroids, and admitted at [facility]. CRP and LDH were elevated though PCT and d-dimer were normal. Hospitalization has been complicated by progressing hypoxia for which convalescent plasma was given and AFib with RVR for which metoprolol has been increased and diltiazem added. Hypoxia requiring 15L HFNC on 11/17.  Assessment & Plan: Principal Problem:   COVID-19 virus infection Active Problems:   Acute respiratory failure with hypoxia (HCC)   Paroxysmal atrial fibrillation with RVR ([place])   Uncontrolled type 2 diabetes mellitus with hyperglycemia, with long-term current use of insulin (HCC)   Hyperlipidemia   Degeneration of lumbar intervertebral disc   COPD, moderate (HCC)   Acute renal failure superimposed on stage 3a chronic kidney disease (HCC)   COPD with acute exacerbation (HCC)   PVD (peripheral vascular disease) ([place])   Hepatic steatosis   Acute hypoxic respiratory failure due to acute covid-19 pneumonia, POA: - Covid+ prior to admit - results 05/30/19 - No wheezing, BNP 37. D-dimer below age-adjusted cutoff and V/Q scan revealed low probability of PE. DC'ed antibiotics with negative PCT.  - Completed remdesivir 11/17 - Steroids given abrupt increase in CRP - S/P CCP 11/15.  - D-dimer slightly up but pt on therapeutic  anticoagulation. SpO2: 93 % O2 Flow Rate (L/min): 4 L/min Recent Labs    06/07/19 0330  DDIMER 1.10*  CRP 1.2*   Lab Results  Component Value Date   SARSCOV2NAA NEGATIVE 05/31/2019   Constipation, resolved Ileus ruled out - resolving on new medications - continue PRNs - Flat plat unremarkable  PAF with RVR, resolved:  - Improving control. Not hypotensive. Back to NSR 11/17. - Continue metoprolol at 132m po BID, added diltiazem CR [id number] with improvement - Continue pradaxa  AKI on stage IIIa CKD, POA:  - Continues to improve, back near baseline. - Restarted home lasix 2035mo daily, Cr still improving. - Trend, avoid nephrotoxins.  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  - Continue statin - Outpatient follow up  Uncontrolled IDDM - type 2; with hyperglycemia: - Insulin dependent since 2004 and very resistant based on requirements at home - HbA1c 7.5% - Home meds include 110u qHS and 30u qAM, 30u qLunch, and 40u qDinner  - Continue to increase patient insulin as necessary; increase Lantus to 60u  HTN:  - Continue metoprolol and lasix  Nausea, vomiting:  - Resolved. CT abd/pelvis without acute etiology   Hepatic steatosis:  - Noted on CT  - Monitor ALT (remains normal), especially w/remdesivir Tx   AST elevation, resolved: - In isolation, unclear significance - Continue to monitor  Lumbar DDD:  - Continue tylenol prn and add tramadol prn mod/severe pain - Pt aware that mobility is important  DVT prophylaxis: Pradaxa Code Status: Full Family Communication: Daughter, lengthy discussion over the phone about patient's improving  prognosis and likely need for SNF Disposition Plan: Likely to SNF in the next few days  Procedures:   Echocardiogram 05/31/2019: 1. Left ventricular ejection fraction, by visual estimation, is 60 to 65%. The left ventricle has normal function. Left ventricular septal wall  thickness was mildly increased. Mildly increased left ventricular posterior wall thickness. There is mildly  increased left ventricular hypertrophy.  2. Global right ventricle has normal systolic function.The right ventricular size is mildly enlarged. No increase in right ventricular wall thickness.  3. Left atrial size was normal.  4. Right atrial size was mildly dilated.  5. The mitral valve was not well visualized. Trace mitral valve regurgitation.  6. The tricuspid valve is not well visualized. Tricuspid valve regurgitation is mild.  7. The aortic valve is grossly normal. Aortic valve regurgitation is trivial.  8. The pulmonic valve was not well visualized. Pulmonic valve regurgitation is trivial.  9. The aortic root was not well visualized. 10. Mildly elevated pulmonary artery systolic pressure. 11. The atrial septum is grossly normal.  Antimicrobials: Ceftriaxone, azithromycin Remdesivir   Subjective: Feels somewhat improved since yesterday. Denies constipation  Objective: Vitals:   06/09/19 1000 06/09/19 1100 06/09/19 1150 06/09/19 1222  BP:      Pulse: 82 80    Resp:      Temp:      TempSrc:      SpO2: 96% 91% (!) 81% 93%  Weight:      Height:        Intake/Output Summary (Last 24 hours) at 06/09/2019 1446 Last data filed at 06/09/2019 0700 Gross per 24 hour  Intake --  Output 1250 ml  Net -1250 ml   Filed Weights   06/02/19 0445  Weight: 85.8 kg   Gen: 73 y.o. male in no distress Pulm: Nonlabored, mildly tachypneic but comfortable. Crackles bilaterally CV: Regular without murmur, rub, or gallop. No JVD, no dependent edema. GI: Abdomen soft, non-tender, non-distended, with normoactive bowel sounds.  Ext: Warm, no deformities Skin: No rashes, lesions or ulcers on visualized skin. Neuro: Alert and oriented. No focal neurological deficits.  Data Reviewed: I have personally reviewed following labs and imaging studies  CBC: Recent Labs  Lab 06/03/19 0145  06/04/19 0215 06/05/19 0650 06/06/19 0435 06/07/19 0330  WBC 8.4 7.1 6.5 9.2 11.3*  NEUTROABS 6.5 6.2 5.5 7.7 9.5*  HGB 13.2 13.7 13.0 13.7 13.0  HCT 40.2 40.9 39.4 39.4 38.4*  MCV 95.7 94.2 92.7 92.1 91.4  PLT 162 189 237 289 097   Basic Metabolic Panel: Recent Labs  Lab 06/03/19 0145 06/04/19 0215 06/05/19 0650 06/06/19 0435 06/07/19 0330  NA 135 137 133* 132* 127*  K 4.4 4.8 4.4 4.6 4.8  CL 97* 98 97* 98 94*  CO2 _0 GLUCOSE 61* 91 142* 237* 227*  BUN 38* 42* 43* 42* 44*  CREATININE 1.53* 1.50* 1.28* 1.32* 1.42*  CALCIUM 8.6* 8.7* 8.4* 8.6* 8.3*  MG  --   --  2.0  --   --    GFR: Estimated Creatinine Clearance: 47.9 mL/min (A) (by C-G formula based on SCr of 1.42 mg/dL (H)).   Liver Function Tests: Recent Labs  Lab 06/03/19 0145 06/04/19 0215 06/05/19 0650 06/06/19 0435 06/07/19 0330  AST 51* 45* 34 27 25  ALT 29 34 32 31 33  ALKPHOS 25* 31* 31* 33* 33*  BILITOT 0.9 1.3* 1.5* 1.0 1.6*  PROT 7.0 7.1 6.7 6.4* 6.3*  ALBUMIN 3.4* 3.6 3.3* 3.0* 3.1*  CBG: Recent Labs  Lab 06/08/19 0758 06/08/19 1117 06/08/19 1656 06/08/19 2103 06/09/19 1155  GLUCAP 170* 228* 187* 290* 261*   Urine analysis:    Component Value Date/Time   COLORURINE YELLOW (A) 01/05/2016 0408   APPEARANCEUR CLEAR (A) 01/05/2016 0408   LABSPEC 1.020 01/05/2016 0408   PHURINE 5.0 01/05/2016 0408   GLUCOSEU 50 (A) 01/05/2016 0408   HGBUR NEGATIVE 01/05/2016 0408   BILIRUBINUR NEGATIVE 01/05/2016 0408   KETONESUR NEGATIVE 01/05/2016 0408   PROTEINUR 100 (A) 01/05/2016 0408   NITRITE NEGATIVE 01/05/2016 0408   LEUKOCYTESUR NEGATIVE 01/05/2016 0408   Recent Results (from the past 240 hour(s))  SARS CORONAVIRUS 2 (TAT 6-24 HRS) Nasopharyngeal Nasopharyngeal Swab     Status: None   Collection Time: 05/31/19  5:34 AM   Specimen: Nasopharyngeal Swab  Result Value Ref Range Status   SARS Coronavirus 2 NEGATIVE NEGATIVE Final    Comment: (NOTE) SARS-CoV-2 target nucleic  acids are NOT DETECTED. The SARS-CoV-2 RNA is generally detectable in upper and lower respiratory specimens during the acute phase of infection. Negative results do not preclude SARS-CoV-2 infection, do not rule out co-infections with other pathogens, and should not be used as the sole basis for treatment or other patient management decisions. Negative results must be combined with clinical observations, patient history, and epidemiological information. The expected result is Negative. Fact Sheet for Patients: SugarRoll.be Fact Sheet for Healthcare Providers: https://www.woods-mathews.com/ This test is not yet approved or cleared by the [place] FDA and  has been authorized for detection and/or diagnosis of SARS-CoV-2 by FDA under an Emergency Use Authorization (EUA). This EUA will remain  in effect (meaning this test can be used) for the duration of the COVID-19 declaration under Section 56 4(b)(1) of the Act, 21 U.S.C. section 360bbb-3(b)(1), unless the authorization is terminated or revoked sooner. Performed at [facility], [place] [address] [place], [place] [address]   CULTURE, BLOOD (ROUTINE X 2) w Reflex to ID Panel     Status: None   Collection Time: 05/31/19  2:25 PM   Specimen: BLOOD  Result Value Ref Range Status   Specimen Description BLOOD LEFT ANTECUBITAL  Final   Special Requests   Final    BOTTLES DRAWN AEROBIC AND ANAEROBIC Blood Culture adequate volume   Culture   Final    NO GROWTH 5 DAYS Performed at [facility], [address] [place], [place] [address]    Report Status 06/05/2019 FINAL  Final  CULTURE, BLOOD (ROUTINE X 2) w Reflex to ID Panel     Status: None   Collection Time: 05/31/19  2:25 PM   Specimen: BLOOD  Result Value Ref Range Status   Specimen Description BLOOD BLOOD LEFT HAND  Final   Special Requests   Final    BOTTLES DRAWN AEROBIC AND ANAEROBIC Blood Culture adequate volume    Culture   Final    NO GROWTH 5 DAYS Performed at [facility], [address] [place],  [address]    Report Status 06/05/2019 FINAL  Final      Radiology Studies: Dg Abd 1 View  Result Date: 06/08/2019 CLINICAL DATA:  Constipation EXAM: ABDOMEN - 1 VIEW COMPARISON:  CT abdomen pelvis-05/31/2019; chest radiograph-earlier same day FINDINGS: Moderate gaseous distention of the colon without upstream distension of the small bowel. Unremarkable colonic stool burden. Nondiagnostic evaluation for pneumoperitoneum secondary to supine positioning and exclusion of the lower thorax. No pneumatosis or portal venous gas. No definitive abnormal  intra-abdominal calcifications. Degenerative change the lower lumbar spine and bilateral hips is suspected though incompletely evaluated. IMPRESSION: 1. Mild gaseous distention of the colon without evidence of enteric obstruction. 2. Unremarkable colonic stool burden. Electronically Signed   By: Sandi Mariscal M.D.   On: 06/08/2019 08:33    Scheduled Meds:  albuterol  2 puff Inhalation Q6H   allopurinol  100 mg Oral Daily   atorvastatin  20 mg Oral q1800   dabigatran  150 mg Oral BID   dexamethasone  6 mg Oral Q12H   diltiazem  60 mg Oral Q12H   fluticasone furoate-vilanterol  1 puff Inhalation Daily   furosemide  20 mg Oral Daily   insulin aspart  0-15 Units Subcutaneous TID WC   insulin aspart  0-5 Units Subcutaneous QHS   insulin glargine  60 Units Subcutaneous QHS   metoprolol tartrate  100 mg Oral BID   pantoprazole  40 mg Oral Daily   polyethylene glycol  34 g Oral BID   tamsulosin  0.4 mg Oral QHS   umeclidinium bromide  1 puff Inhalation Daily   Continuous Infusions:  sodium chloride Stopped (06/03/19 0934)     LOS: 7 days   Time spent: 35 minutes.  Little Ishikawa, DO [facility] [email or web address] 06/09/2019, 2:46 PM

## 2019-06-10 LAB — GLUCOSE, CAPILLARY
Glucose-Capillary: 182 mg/dL — ABNORMAL HIGH (ref 70–99)
Glucose-Capillary: 288 mg/dL — ABNORMAL HIGH (ref 70–99)
Glucose-Capillary: 319 mg/dL — ABNORMAL HIGH (ref 70–99)
Glucose-Capillary: 409 mg/dL — ABNORMAL HIGH (ref 70–99)

## 2019-06-10 MED ORDER — INSULIN ASPART 100 UNIT/ML ~~LOC~~ SOLN
15.0000 [IU] | Freq: Once | SUBCUTANEOUS | Status: AC
  Administered 2019-06-10: 15 [IU] via SUBCUTANEOUS

## 2019-06-10 NOTE — Progress Notes (Signed)
PROGRESS NOTE  Jay Morris  HYQ:[id number] DOB: 1946-02-26 DOA: 06/02/2019 PCP: [name], MD   Brief Narrative: Jay Morris is a 73 y.o. male with a history of COPD, asthma, PAF on pradaxa, IDT2DM, and HTN who presented to [facility] 11/12 with fever, chills, and worsening shortness of breath. He had presented to his PCP 11/11 and was tested for covid which was pending. In the ED he was hypoxic with CXR showing increased interstitial markings, ECG NSR. He was given ceftriaxone, azithromycin and admitted to [facility] with an in-house test being negative for SARS-CoV-2. After admission, the initial covid test resulted positive, so he was given remdesivir, steroids, and admitted at [facility]. CRP and LDH were elevated though PCT and d-dimer were normal. Hospitalization has been complicated by progressing hypoxia for which convalescent plasma was given and AFib with RVR for which metoprolol has been increased and diltiazem added. Hypoxia requiring 15L HFNC on 11/17.  Assessment & Plan: Principal Problem:   COVID-19 virus infection Active Problems:   Acute respiratory failure with hypoxia (HCC)   Paroxysmal atrial fibrillation with RVR ([place])   Uncontrolled type 2 diabetes mellitus with hyperglycemia, with long-term current use of insulin (HCC)   Hyperlipidemia   Degeneration of lumbar intervertebral disc   COPD, moderate (HCC)   Acute renal failure superimposed on stage 3a chronic kidney disease (HCC)   COPD with acute exacerbation (HCC)   PVD (peripheral vascular disease) ([place])   Hepatic steatosis   Acute hypoxic respiratory failure due to acute covid-19 pneumonia, POA: - Covid+ prior to admit - results 05/30/19 - No wheezing, BNP 37. D-dimer below age-adjusted cutoff and V/Q scan revealed low probability of PE. DC'ed antibiotics with negative PCT.  - Completed remdesivir 11/17 - Steroids given abrupt increase in CRP - continue until patient oxygen continues to improve - S/P CCP 11/15.  -  D-dimer slightly up but pt on therapeutic anticoagulation. SpO2: 95 % O2 Flow Rate (L/min): 2 L/min    Constipation, resolved Ileus ruled out - resolving on new medications - continue PRNs - Flat plat unremarkable  PAF with RVR, resolved:  - Improving control. Not hypotensive. Back to NSR 11/17. - Continue metoprolol at 122m po BID, added diltiazem CR 64mq12h with improvement - Continue pradaxa  AKI on stage IIIa CKD, POA:  - Continues to improve, back near baseline. - Restarted home lasix 2011mo daily, Cr still improving. - Trend, avoid nephrotoxins.  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  - Continue statin - Outpatient follow up  Uncontrolled IDDM - type 2; with hyperglycemia: - Insulin dependent since 2004 and very resistant based on requirements at home - HbA1c 7.5% - Home meds include 110u qHS and 30u qAM, 30u qLunch, and 40u qDinner  - Continue to increase patient insulin as necessary; increase Lantus to 60u  HTN:  - Continue metoprolol and lasix  Nausea, vomiting:  - Resolved. CT abd/pelvis without acute etiology   Hepatic steatosis:  - Noted on CT  - Monitor ALT (remains normal), especially w/remdesivir Tx   AST elevation, resolved: - In isolation, unclear significance - Continue to monitor  Lumbar DDD:  - Continue tylenol prn and add tramadol prn mod/severe pain - Pt aware that mobility is important  DVT prophylaxis: Pradaxa Code Status: Full Family Communication: Daughter, lengthy discussion over the phone about patient's improving prognosis and likely need for SNF Disposition Plan: Likely to SNF in the next few days  Procedures:   Echocardiogram 05/31/2019: 1. Left ventricular ejection fraction, by visual estimation, is 60 to 65%. The left ventricle has normal function. Left ventricular septal wall thickness was mildly increased. Mildly increased left ventricular posterior wall thickness.  There is mildly  increased left ventricular hypertrophy.  2. Global right ventricle has normal systolic function.The right ventricular size is mildly enlarged. No increase in right ventricular wall thickness.  3. Left atrial size was normal.  4. Right atrial size was mildly dilated.  5. The mitral valve was not well visualized. Trace mitral valve regurgitation.  6. The tricuspid valve is not well visualized. Tricuspid valve regurgitation is mild.  7. The aortic valve is grossly normal. Aortic valve regurgitation is trivial.  8. The pulmonic valve was not well visualized. Pulmonic valve regurgitation is trivial.  9. The aortic root was not well visualized. 10. Mildly elevated pulmonary artery systolic pressure. 11. The atrial septum is grossly normal.  Antimicrobials: Ceftriaxone, azithromycin Remdesivir   Subjective: Feels somewhat improved since yesterday. Denies constipation  Objective: Vitals:   06/10/19 0749 06/10/19 0951 06/10/19 1002 06/10/19 1100  BP: 134/82     Pulse: 83     Resp: _0 Temp: 97.7 F (36.5 C)     TempSrc: Oral     SpO2: 93% 97% 95% 95%  Weight:      Height:        Intake/Output Summary (Last 24 hours) at 06/10/2019 1541 Last data filed at 06/10/2019 1300 Gross per 24 hour  Intake 720 ml  Output 1650 ml  Net -930 ml   Filed Weights   06/02/19 0445  Weight: 85.8 kg   Gen: 73 y.o. male in no distress Pulm: Nonlabored, mildly tachypneic but comfortable. Crackles bilaterally CV: Regular without murmur, rub, or gallop. No JVD, no dependent edema. GI: Abdomen soft, non-tender, non-distended, with normoactive bowel sounds.  Ext: Warm, no deformities Skin: No rashes, lesions or ulcers on visualized skin. Neuro: Alert and oriented. No focal neurological deficits.  Data Reviewed: I have personally reviewed following labs and imaging studies  CBC: Recent Labs  Lab 06/04/19 0215 06/05/19 0650 06/06/19 0435 06/07/19 0330  WBC 7.1 6.5 9.2  11.3*  NEUTROABS 6.2 5.5 7.7 9.5*  HGB 13.7 13.0 13.7 13.0  HCT 40.9 39.4 39.4 38.4*  MCV 94.2 92.7 92.1 91.4  PLT 189 237 289 492   Basic Metabolic Panel: Recent Labs  Lab 06/04/19 0215 06/05/19 0650 06/06/19 0435 06/07/19 0330  NA 137 133* 132* 127*  K 4.8 4.4 4.6 4.8  CL 98 97* 98 94*  CO2 _1 GLUCOSE 91 142* 237* 227*  BUN 42* 43* 42* 44*  CREATININE 1.50* 1.28* 1.32* 1.42*  CALCIUM 8.7* 8.4* 8.6* 8.3*  MG  --  2.0  --   --    GFR: Estimated Creatinine Clearance: 47.9 mL/min (A) (by C-G formula based on SCr of 1.42 mg/dL (H)).   Liver Function Tests: Recent Labs  Lab 06/04/19 0215 06/05/19 0650 06/06/19 0435 06/07/19 0330  AST 45* 34 27 25  ALT 34 32 31 33  ALKPHOS 31* 31* 33* 33*  BILITOT 1.3* 1.5* 1.0 1.6*  PROT 7.1 6.7 6.4* 6.3*  ALBUMIN 3.6 3.3* 3.0* 3.1*   CBG: Recent Labs  Lab 06/08/19 2103 06/09/19 1155 06/09/19 1720 06/10/19 0734 06/10/19 1142  GLUCAP 290* 261* 288* 182* 288*   Urine analysis:    Component Value Date/Time   COLORURINE YELLOW (A) 01/05/2016 0408  APPEARANCEUR CLEAR (A) 01/05/2016 0408   LABSPEC 1.020 01/05/2016 0408   PHURINE 5.0 01/05/2016 0408   GLUCOSEU 50 (A) 01/05/2016 0408   HGBUR NEGATIVE 01/05/2016 0408   BILIRUBINUR NEGATIVE 01/05/2016 0408   KETONESUR NEGATIVE 01/05/2016 0408   PROTEINUR 100 (A) 01/05/2016 0408   NITRITE NEGATIVE 01/05/2016 0408   LEUKOCYTESUR NEGATIVE 01/05/2016 0408   No results found for this or any previous visit (from the past 240 hour(s)).    Radiology Studies: No results found.  Scheduled Meds: . albuterol  2 puff Inhalation Q6H  . allopurinol  100 mg Oral Daily  . atorvastatin  20 mg Oral q1800  . dabigatran  150 mg Oral BID  . dexamethasone  6 mg Oral Q12H  . diltiazem  60 mg Oral Q12H  . fluticasone furoate-vilanterol  1 puff Inhalation Daily  . furosemide  20 mg Oral Daily  . insulin aspart  0-15 Units Subcutaneous TID WC  . insulin aspart  0-5 Units  Subcutaneous QHS  . insulin glargine  60 Units Subcutaneous QHS  . metoprolol tartrate  100 mg Oral BID  . pantoprazole  40 mg Oral Daily  . polyethylene glycol  34 g Oral BID  . tamsulosin  0.4 mg Oral QHS  . umeclidinium bromide  1 puff Inhalation Daily   Continuous Infusions: . sodium chloride Stopped (06/03/19 0934)     LOS: 8 days   Time spent: 35 minutes.  Little Ishikawa, DO [facility] [email or web address] 06/10/2019, 3:41 PM

## 2019-06-10 NOTE — NC FL2 (Signed)
[place] LEVEL OF CARE SCREENING TOOL     IDENTIFICATION  Patient Name: Jay Morris Birthdate: 1946/02/02 Sex: male Admission Date (Current Location): 06/02/2019  [facility] and [place] Number:  Herbalist and Address:  [facility], [place] [address], [place], [place] [address]      Provider Number: [id number]  Attending Physician Name and Address:  Little Ishikawa, MD  Relative Name and Phone Number:       Current Level of Care: Hospital Recommended Level of Care: [place] Prior Approval Number:    Date Approved/Denied:   PASRR Number: [id number]  Discharge Plan: SNF    Current Diagnoses: Patient Active Problem List   Diagnosis Date Noted  . PVD (peripheral vascular disease) ([place]) 06/03/2019  . Hepatic steatosis 06/03/2019  . Acute renal failure superimposed on stage 3a chronic kidney disease ([place]) 06/01/2019  . COPD with acute exacerbation ([place]) 06/01/2019  . Muscle weakness (generalized) 06/01/2019  . COVID-19 virus infection 06/01/2019  . AKI (acute kidney injury) ([place]) 05/31/2019  . Acute respiratory failure with hypoxia ([place])   . At risk for bleeding 12/15/2017  . Hypotension 12/01/2017  . Iron deficiency anemia due to chronic blood loss   . GI bleed 11/21/2017  . Uncontrolled type 2 diabetes mellitus with hyperglycemia, with long-term current use of insulin ([place]) 11/24/2016  . Chronic gouty arthritis 04/16/2016  . COPD, moderate ([place]) 02/09/2016  . Pneumonia 01/04/2016  . Degeneration of lumbar intervertebral disc 10/28/2015  . Paroxysmal atrial fibrillation with RVR ([place]) 05/21/2014  . Hyperlipidemia 05/21/2014    Orientation RESPIRATION BLADDER Height & Weight     Self, Time, Place, Situation  O2(HFNC 2L) Continent Weight: 189 lb 2.5 oz (85.8 kg) Height:  5' 8" (172.7 cm)  BEHAVIORAL SYMPTOMS/MOOD NEUROLOGICAL BOWEL NUTRITION STATUS      Continent Diet(carb modified)  AMBULATORY  STATUS COMMUNICATION OF NEEDS Skin   Limited Assist Verbally Normal                       Personal Care Assistance Level of Assistance  Bathing, Feeding, Dressing Bathing Assistance: Limited assistance Feeding assistance: Independent Dressing Assistance: Limited assistance     Functional Limitations Info  Sight, Hearing, Speech Sight Info: Adequate Hearing Info: Adequate Speech Info: Adequate    SPECIAL CARE FACTORS FREQUENCY  PT (By licensed PT), OT (By licensed OT)     PT Frequency: 5x OT Frequency: 5x            Contractures Contractures Info: Not present    Additional Factors Info  Code Status, Allergies Code Status Info: Full code Allergies Info: Shrimp (Shellfish Allergy)           Current Medications (06/10/2019):  This is the current hospital active medication list Current Facility-Administered Medications  Medication Dose Route Frequency Provider Last Rate Last Dose  . 0.9 %  sodium chloride infusion   Intravenous PRN Patrecia Pour, MD   Stopped at 06/03/19 [phone]  . acetaminophen (TYLENOL) tablet 650 mg  650 mg Oral Q6H PRN Etta Quill, DO   650 mg at 06/10/19 1008  . albuterol (VENTOLIN HFA) 108 (90 Base) MCG/ACT inhaler 2 puff  2 puff Inhalation Q6H Jennette Kettle M, DO   2 puff at 06/10/19 1324  . allopurinol (ZYLOPRIM) tablet 100 mg  100 mg Oral Daily Jennette Kettle M, DO   100 mg at 06/10/19 1003  . atorvastatin (LIPITOR) tablet 20 mg  20 mg Oral q1800 Etta Quill, DO   20 mg at 06/09/19 1725  . chlorpheniramine-HYDROcodone (TUSSIONEX) 10-8 MG/5ML suspension 5 mL  5 mL Oral Q12H PRN Etta Quill, DO   5 mL at 06/07/19 0110  . dabigatran (PRADAXA) capsule 150 mg  150 mg Oral BID Jennette Kettle M, DO   150 mg at 06/10/19 1003  . dexamethasone (DECADRON) tablet 6 mg  6 mg Oral Q12H Patrecia Pour, MD   6 mg at 06/10/19 1003  . diltiazem (CARDIZEM SR) 12 hr capsule 60 mg  60 mg Oral Q12H Patrecia Pour, MD   60 mg at 06/10/19 1003  .  diphenhydrAMINE (BENADRYL) capsule 25 mg  25 mg Oral Q6H PRN Etta Quill, DO   25 mg at 06/09/19 2134  . fluticasone furoate-vilanterol (BREO ELLIPTA) 100-25 MCG/INH 1 puff  1 puff Inhalation Daily Jennette Kettle M, DO   1 puff at 06/10/19 0843  . furosemide (LASIX) tablet 20 mg  20 mg Oral Daily Patrecia Pour, MD   20 mg at 06/10/19 1003  . guaiFENesin-dextromethorphan (ROBITUSSIN DM) 100-10 MG/5ML syrup 10 mL  10 mL Oral Q4H PRN Etta Quill, DO   10 mL at 06/06/19 2017  . insulin aspart (novoLOG) injection 0-15 Units  0-15 Units Subcutaneous TID WC Etta Quill, DO   8 Units at 06/10/19 1242  . insulin aspart (novoLOG) injection 0-5 Units  0-5 Units Subcutaneous QHS Etta Quill, DO   3 Units at 06/09/19 2135  . insulin glargine (LANTUS) injection 60 Units  60 Units Subcutaneous QHS Little Ishikawa, MD   60 Units at 06/09/19 2134  . metoprolol tartrate (LOPRESSOR) injection 5 mg  5 mg Intravenous Q3H PRN Patrecia Pour, MD   5 mg at 06/03/19 1123  . metoprolol tartrate (LOPRESSOR) tablet 100 mg  100 mg Oral BID Patrecia Pour, MD   100 mg at 06/10/19 1003  . ondansetron (ZOFRAN) tablet 4 mg  4 mg Oral Q6H PRN Etta Quill, DO       Or  . ondansetron [facility]) injection 4 mg  4 mg Intravenous Q6H PRN Etta Quill, DO   4 mg at 06/10/19 1337  . pantoprazole (PROTONIX) EC tablet 40 mg  40 mg Oral Daily Jennette Kettle M, DO   40 mg at 06/10/19 1003  . polyethylene glycol (MIRALAX / GLYCOLAX) packet 34 g  34 g Oral BID Wynell Balloon, RPH   34 g at 06/07/19 4069  . senna (SENOKOT) tablet 8.6 mg  1 tablet Oral Daily PRN Little Ishikawa, MD   8.6 mg at 06/07/19 1144  . sodium chloride (OCEAN) 0.65 % nasal spray 1 spray  1 spray Each Nare PRN Little Ishikawa, MD      . tamsulosin [facility]) capsule 0.4 mg  0.4 mg Oral QHS Jennette Kettle M, DO   0.4 mg at 06/09/19 2134  . traMADol (ULTRAM) tablet 50 mg  50 mg Oral Q12H PRN Patrecia Pour, MD   50 mg at 06/09/19 2134   . umeclidinium bromide (INCRUSE ELLIPTA) 62.5 MCG/INH 1 puff  1 puff Inhalation Daily Etta Quill, DO   1 puff at 06/10/19 8614     Discharge Medications: Please see discharge summary for a list of discharge medications.  Relevant Imaging Results:  Relevant Lab Results:   Additional Information ADG:[id number]  Gerrianne Scale Kyisha Fowle, LCSW

## 2019-06-11 LAB — GLUCOSE, CAPILLARY
Glucose-Capillary: 132 mg/dL — ABNORMAL HIGH (ref 70–99)
Glucose-Capillary: 398 mg/dL — ABNORMAL HIGH (ref 70–99)
Glucose-Capillary: 490 mg/dL — ABNORMAL HIGH (ref 70–99)
Glucose-Capillary: 416 mg/dL — ABNORMAL HIGH (ref 70–99)
Glucose-Capillary: 432 mg/dL — ABNORMAL HIGH (ref 70–99)
Glucose-Capillary: 381 mg/dL — ABNORMAL HIGH (ref 70–99)

## 2019-06-11 MED ORDER — INSULIN ASPART 100 UNIT/ML ~~LOC~~ SOLN
15.0000 [IU] | Freq: Once | SUBCUTANEOUS | Status: AC
  Administered 2019-06-11: 15 [IU] via SUBCUTANEOUS

## 2019-06-11 MED ORDER — ALUM & MAG HYDROXIDE-SIMETH 200-200-20 MG/5ML PO SUSP
15.0000 mL | Freq: Four times a day (QID) | ORAL | Status: DC | PRN
  Administered 2019-06-11 – 2019-06-14 (×3): 15 mL via ORAL
  Filled 2019-06-11 (×4): qty 30

## 2019-06-11 NOTE — Progress Notes (Signed)
Inpatient Diabetes Program Recommendations  AACE/ADA: New Consensus Statement on Inpatient Glycemic Control (2015)  Target Ranges:  Prepandial:   less than 140 mg/dL      Peak postprandial:   less than 180 mg/dL (1-2 hours)      Critically ill patients:  140 - 180 mg/dL   Lab Results  Component Value Date   GLUCAP 132 (H) 06/11/2019   HGBA1C 7.5 (H) 05/31/2019    Review of Glycemic Control Results for MEKEL, HAVERSTOCK (MRN [id number]) as of 06/11/2019 10:46  Ref. Range 06/10/2019 11:42 06/10/2019 16:33 06/10/2019 20:26 06/11/2019 07:41  Glucose-Capillary Latest Ref Range: 70 - 99 mg/dL 288 (H) 319 (H) 409 (H) 132 (H)   History:DM, COPD   Home DM Meds: Tresiba 120 units QHS Novolog 30 units TID Metformin 1000 mgBID Current orders for Inpatient glycemic control:Lantus 60 units + Novolog moderate correction tid + hs Decadron 6 mg BID  Inpatient Diabetes Program Recommendations:  -Add Novolog 5 units tid meal coverage if patient eats 50%  Thanks, Bronson Curb, MSN, RNC-OB Diabetes Coordinator [phone] (8a-5p)

## 2019-06-11 NOTE — Progress Notes (Signed)
PROGRESS NOTE  Jay Morris  EBX:[id number] DOB: 12/14/1945 DOA: 06/02/2019 PCP: [name], MD   Brief Narrative: Jay Morris is a 73 y.o. male with a history of COPD, asthma, PAF on pradaxa, IDT2DM, and HTN who presented to [facility] 11/12 with fever, chills, and worsening shortness of breath. He had presented to his PCP 11/11 and was tested for covid which was pending. In the ED he was hypoxic with CXR showing increased interstitial markings, ECG NSR. He was given ceftriaxone, azithromycin and admitted to [facility] with an in-house test being negative for SARS-CoV-2. After admission, the initial covid test resulted positive, so he was given remdesivir, steroids, and admitted at [facility]. CRP and LDH were elevated though PCT and d-dimer were normal. Hospitalization has been complicated by progressing hypoxia for which convalescent plasma was given and AFib with RVR for which metoprolol has been increased and diltiazem added. Hypoxia requiring 15L HFNC on 11/17.  Assessment & Plan: Principal Problem:   COVID-19 virus infection Active Problems:   Acute respiratory failure with hypoxia (HCC)   Paroxysmal atrial fibrillation with RVR ([place])   Uncontrolled type 2 diabetes mellitus with hyperglycemia, with long-term current use of insulin (HCC)   Hyperlipidemia   Degeneration of lumbar intervertebral disc   COPD, moderate (HCC)   Acute renal failure superimposed on stage 3a chronic kidney disease (HCC)   COPD with acute exacerbation (HCC)   PVD (peripheral vascular disease) ([place])   Hepatic steatosis   Acute hypoxic respiratory failure due to acute covid-19 pneumonia, POA: -Covid+ prior to admit - results 05/30/19 -No wheezing, BNP 37. D-dimer below age-adjusted cutoff and V/Q scan revealed low probability of PE. DC'd antibiotics with negative PCT.  -Completed remdesivir 11/17 -Steroids given abrupt increase in CRP - continue until patient oxygen continues to improve -S/P CCP 11/15.  -D-dimer  slightly up but pt on therapeutic anticoagulation. SpO2: 92 % O2 Flow Rate (L/min): 3 L/min    Constipation, resolved Ileus ruled out - resolving on new medications - continue PRNs - Flat plat unremarkable  PAF with RVR, resolved:  - Improving control. Not hypotensive. Back to NSR 11/17. - Continue metoprolol at 136m po BID, added diltiazem CR [id number] with improvement - Continue pradaxa  AKI on stage IIIa CKD, POA:  - Continues to improve, back near baseline. - Restarted home lasix 2016mo daily, Cr still improving. - Trend, avoid nephrotoxins.  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  - Continue statin - Outpatient follow up  Uncontrolled IDDM - type 2; with hyperglycemia: - Insulin dependent since 2004 and very resistant based on requirements at home - HbA1c 7.5% - Home meds include 110u qHS and 30u qAM, 30u qLunch, and 40u qDinner  - Continue to increase patient insulin as necessary; increase Lantus to 60u  HTN:  - Continue metoprolol and lasix  Nausea, vomiting:  - Resolved. CT abd/pelvis without acute etiology   Hepatic steatosis:  - Noted on CT  - Monitor ALT (remains normal), especially w/remdesivir Tx   AST elevation, resolved: - In isolation, unclear significance - Continue to monitor  Lumbar DDD:  - Continue tylenol prn and add tramadol prn mod/severe pain - Pt aware that mobility is important  DVT prophylaxis: Pradaxa Code Status: Full Family Communication: Daughter, lengthy discussion over the phone about patient's improving prognosis and likely need for SNF Disposition Plan: Pending acceptance/approval for SNF placement; unclear if patient needs repeat covid negative swab given previous  negative here (although >48h ago)  Procedures:   Echocardiogram 05/31/2019: 1. Left ventricular ejection fraction, by visual estimation, is 60 to 65%. The left ventricle has normal function. Left ventricular  septal wall thickness was mildly increased. Mildly increased left ventricular posterior wall thickness. There is mildly  increased left ventricular hypertrophy.  2. Global right ventricle has normal systolic function.The right ventricular size is mildly enlarged. No increase in right ventricular wall thickness.  3. Left atrial size was normal.  4. Right atrial size was mildly dilated.  5. The mitral valve was not well visualized. Trace mitral valve regurgitation.  6. The tricuspid valve is not well visualized. Tricuspid valve regurgitation is mild.  7. The aortic valve is grossly normal. Aortic valve regurgitation is trivial.  8. The pulmonic valve was not well visualized. Pulmonic valve regurgitation is trivial.  9. The aortic root was not well visualized. 10. Mildly elevated pulmonary artery systolic pressure. 11. The atrial septum is grossly normal.  Antimicrobials: Ceftriaxone, azithromycin Remdesivir   Subjective: Feels somewhat improved since yesterday. Denies constipation.  Objective: Vitals:   06/10/19 1606 06/10/19 1948 06/11/19 0426 06/11/19 1200  BP: 114/70 129/74 120/76 (!) 132/59  Pulse: 82 81 82 99  Resp: 12 12 (!) 22 19  Temp: 97.9 F (36.6 C) 98 F (36.7 C) 98.4 F (36.9 C) 98.6 F (37 C)  TempSrc: Oral Oral Oral Oral  SpO2: 90% 94% 95% 92%  Weight:      Height:        Intake/Output Summary (Last 24 hours) at 06/11/2019 1510 Last data filed at 06/11/2019 1300 Gross per 24 hour  Intake 177 ml  Output 1125 ml  Net -948 ml   Filed Weights   06/02/19 0445  Weight: 85.8 kg   Gen: 73 y.o. male in no distress Pulm: Nonlabored, mildly tachypneic but comfortable. Crackles bilaterally CV: Regular without murmur, rub, or gallop. No JVD, no dependent edema. GI: Abdomen soft, non-tender, non-distended, with normoactive bowel sounds.  Ext: Warm, no deformities Skin: No rashes, lesions or ulcers on visualized skin. Neuro: Alert and oriented. No focal  neurological deficits.  Data Reviewed: I have personally reviewed following labs and imaging studies  CBC: Recent Labs  Lab 06/05/19 0650 06/06/19 0435 06/07/19 0330  WBC 6.5 9.2 11.3*  NEUTROABS 5.5 7.7 9.5*  HGB 13.0 13.7 13.0  HCT 39.4 39.4 38.4*  MCV 92.7 92.1 91.4  PLT 237 289 164   Basic Metabolic Panel: Recent Labs  Lab 06/05/19 0650 06/06/19 0435 06/07/19 0330  NA 133* 132* 127*  K 4.4 4.6 4.8  CL 97* 98 94*  CO2 _0 GLUCOSE 142* 237* 227*  BUN 43* 42* 44*  CREATININE 1.28* 1.32* 1.42*  CALCIUM 8.4* 8.6* 8.3*  MG 2.0  --   --    GFR: Estimated Creatinine Clearance: 47.9 mL/min (A) (by C-G formula based on SCr of 1.42 mg/dL (H)).   Liver Function Tests: Recent Labs  Lab 06/05/19 0650 06/06/19 0435 06/07/19 0330  AST 34 27 25  ALT 32 31 33  ALKPHOS 31* 33* 33*  BILITOT 1.5* 1.0 1.6*  PROT 6.7 6.4* 6.3*  ALBUMIN 3.3* 3.0* 3.1*   CBG: Recent Labs  Lab 06/10/19 0734 06/10/19 1142 06/10/19 1633 06/10/19 2026 06/11/19 0741  GLUCAP 182* 288* 319* 409* 132*   Urine analysis:    Component Value Date/Time   COLORURINE YELLOW (A) 01/05/2016 0408   APPEARANCEUR CLEAR (A) 01/05/2016 0408   LABSPEC 1.020 01/05/2016 0408  PHURINE 5.0 01/05/2016 0408   GLUCOSEU 50 (A) 01/05/2016 0408   HGBUR NEGATIVE 01/05/2016 0408   BILIRUBINUR NEGATIVE 01/05/2016 0408   KETONESUR NEGATIVE 01/05/2016 0408   PROTEINUR 100 (A) 01/05/2016 0408   NITRITE NEGATIVE 01/05/2016 0408   LEUKOCYTESUR NEGATIVE 01/05/2016 0408   No results found for this or any previous visit (from the past 240 hour(s)).    Radiology Studies: No results found.  Scheduled Meds: . albuterol  2 puff Inhalation Q6H  . allopurinol  100 mg Oral Daily  . atorvastatin  20 mg Oral q1800  . dabigatran  150 mg Oral BID  . dexamethasone  6 mg Oral Q12H  . diltiazem  60 mg Oral Q12H  . fluticasone furoate-vilanterol  1 puff Inhalation Daily  . furosemide  20 mg Oral Daily  . insulin  aspart  0-15 Units Subcutaneous TID WC  . insulin aspart  0-5 Units Subcutaneous QHS  . insulin glargine  60 Units Subcutaneous QHS  . metoprolol tartrate  100 mg Oral BID  . pantoprazole  40 mg Oral Daily  . polyethylene glycol  34 g Oral BID  . tamsulosin  0.4 mg Oral QHS  . umeclidinium bromide  1 puff Inhalation Daily   Continuous Infusions: . sodium chloride Stopped (06/03/19 0934)     LOS: 9 days   Time spent: 35 minutes.  Little Ishikawa, DO [facility] [email or web address] 06/11/2019, 3:10 PM

## 2019-06-11 NOTE — TOC Progression Note (Signed)
Transition of Care [facility]) - Progression Note    Patient Details  Name: Jay Morris MRN: [id number] Date of Birth: 02-02-1946  [facility] of [facility]) CM/SW Contact  Joaquin Courts, RN Phone Number: 06/11/2019, 9:49 AM  Clinical Narrative:    CM attempted to call patient in room but was unsuccessful, CM reached out to patients spouse and discussed recommendation for SNF and explained placement process. With spouse permission, FL2 was faxed out to area SNF accepting Covid + patients. Awaiting bed offers          Expected Discharge Plan and Services                                                 Social Determinants of Health (SDOH) Interventions    Readmission Risk Interventions No flowsheet data found.

## 2019-06-11 NOTE — Progress Notes (Deleted)
CBG 381 at this time.

## 2019-06-11 NOTE — Plan of Care (Signed)
  Problem: Respiratory: Goal: Will maintain a patent airway Outcome: Progressing Goal: Complications related to the disease process, condition or treatment will be avoided or minimized Outcome: Progressing

## 2019-06-12 LAB — GLUCOSE, CAPILLARY
Glucose-Capillary: 190 mg/dL — ABNORMAL HIGH (ref 70–99)
Glucose-Capillary: 350 mg/dL — ABNORMAL HIGH (ref 70–99)
Glucose-Capillary: 403 mg/dL — ABNORMAL HIGH (ref 70–99)
Glucose-Capillary: 440 mg/dL — ABNORMAL HIGH (ref 70–99)

## 2019-06-12 MED ORDER — METOPROLOL TARTRATE 25 MG PO TABS
50.0000 mg | ORAL_TABLET | Freq: Two times a day (BID) | ORAL | Status: DC
  Administered 2019-06-12 – 2019-06-18 (×13): 50 mg via ORAL
  Filled 2019-06-12 (×10): qty 1
  Filled 2019-06-12: qty 2
  Filled 2019-06-12: qty 1
  Filled 2019-06-12: qty 2
  Filled 2019-06-12: qty 1

## 2019-06-12 MED ORDER — INSULIN ASPART 100 UNIT/ML ~~LOC~~ SOLN
1.0000 [IU] | Freq: Once | SUBCUTANEOUS | Status: AC
  Administered 2019-06-12: 1 [IU] via SUBCUTANEOUS

## 2019-06-12 MED ORDER — INSULIN ASPART 100 UNIT/ML ~~LOC~~ SOLN
10.0000 [IU] | Freq: Once | SUBCUTANEOUS | Status: AC
  Administered 2019-06-12: 10 [IU] via SUBCUTANEOUS

## 2019-06-12 MED ORDER — INSULIN GLARGINE 100 UNIT/ML ~~LOC~~ SOLN
40.0000 [IU] | Freq: Two times a day (BID) | SUBCUTANEOUS | Status: DC
  Administered 2019-06-12 – 2019-06-13 (×3): 40 [IU] via SUBCUTANEOUS
  Filled 2019-06-12 (×6): qty 0.4

## 2019-06-12 NOTE — Progress Notes (Signed)
CBG 403 give 15 units on Novolog per [facility]

## 2019-06-12 NOTE — Progress Notes (Signed)
PROGRESS NOTE  Jay Morris  ZOX:[id number] DOB: 05/18/1946 DOA: 06/02/2019 PCP: [name], MD   Brief Narrative: Jay Morris is a 73 y.o. male with a history of COPD, asthma, PAF on pradaxa, IDT2DM, and HTN who presented to [facility] 11/12 with fever, chills, and worsening shortness of breath. He had presented to his PCP 11/11 and was tested for covid which was pending. In the ED he was hypoxic with CXR showing increased interstitial markings, ECG NSR. He was given ceftriaxone, azithromycin and admitted to [facility] with an in-house test being negative for SARS-CoV-2. After admission, the initial covid test resulted positive, so he was given remdesivir, steroids, and admitted at [facility]. CRP and LDH were elevated though PCT and d-dimer were normal. Hospitalization has been complicated by progressing hypoxia for which convalescent plasma was given and AFib with RVR for which metoprolol has been increased and diltiazem added. Hypoxia requiring 15L HFNC on 11/17.  Assessment & Plan: Principal Problem:   COVID-19 virus infection Active Problems:   Acute respiratory failure with hypoxia (HCC)   Paroxysmal atrial fibrillation with RVR ([place])   Uncontrolled type 2 diabetes mellitus with hyperglycemia, with long-term current use of insulin (HCC)   Hyperlipidemia   Degeneration of lumbar intervertebral disc   COPD, moderate (HCC)   Acute renal failure superimposed on stage 3a chronic kidney disease ([place])   COPD with acute exacerbation (HCC)   PVD (peripheral vascular disease) ([place])   Hepatic steatosis   Acute hypoxic respiratory failure due to acute covid-19 pneumonia, POA: -Covid+ prior to admit - results 05/30/19 (subsequently negative in house on 12th) -Antibiotics discontinued with negative procalcitonin.  -Completed remdesivir 11/17 -Steroids now completed 10 day course -S/P CCP 11/15.  -D-dimer slightly up but pt on therapeutic anticoagulation given afib as below; VQ low prob. SpO2: 92 %  O2 Flow Rate (L/min): 6 L/min   Constipation, resolved Ileus ruled out - resolving on new medications - continue PRNs - Flat plat unremarkable  PAF with RVR, resolved:  - Low/normal BP this morning - decrease metoprolol - Decrease metoprolol to 50 mg po BID, continue diltiazem 62m q12h with - Continue pradaxa  AKI on stage IIIa CKD, POA, at baseline:  - Back to baseline. 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  - Continue statin - Outpatient follow up as scheduled  Uncontrolled IDDM - type 2; with hyperglycemia: - Insulin dependent since 2004 and very resistant based on requirements at home - HbA1c 7.5% - Home meds include 110u qHS and 30u qAM, 30u qLunch, and 40u qDinner - patient was hypoglycemic here on 70lantus HS (poor PO intake) - Continue to titrate lantus: increase to 40 BID - (received 37 sliding scale yesterday) -monitor closely for hypoglycemia as steroids have been discontinued  HTN:  - Continue metoprolol and lasix  Nausea, vomiting:  - Resolved. CT abd/pelvis without acute etiology   Hepatic steatosis:  - Noted on CT  - Monitor ALT (remains normal), especially w/remdesivir Tx   AST elevation, resolved: - In isolation, unclear significance - Continue to monitor  Lumbar DDD:  - Continue tylenol prn and add tramadol prn mod/severe pain - Pt aware that mobility is important  DVT prophylaxis: Pradaxa Code Status: Full Family Communication: Daughter, lengthy discussion over the phone about patient's improving prognosis and likely need for SNF Disposition Plan: Pending acceptance/approval for SNF placement; unclear if patient needs repeat covid negative swab  given previous negative here (although >48h ago)  Procedures:   Echocardiogram 05/31/2019: 1. Left ventricular ejection fraction, by visual estimation, is 60 to 65%. The left ventricle has normal function.  Left ventricular septal wall thickness was mildly increased. Mildly increased left ventricular posterior wall thickness. There is mildly  increased left ventricular hypertrophy.  2. Global right ventricle has normal systolic function.The right ventricular size is mildly enlarged. No increase in right ventricular wall thickness.  3. Left atrial size was normal.  4. Right atrial size was mildly dilated.  5. The mitral valve was not well visualized. Trace mitral valve regurgitation.  6. The tricuspid valve is not well visualized. Tricuspid valve regurgitation is mild.  7. The aortic valve is grossly normal. Aortic valve regurgitation is trivial.  8. The pulmonic valve was not well visualized. Pulmonic valve regurgitation is trivial.  9. The aortic root was not well visualized. 10. Mildly elevated pulmonary artery systolic pressure. 11. The atrial septum is grossly normal.  Antimicrobials: Ceftriaxone, azithromycin Remdesivir   Subjective: No acute issues or events overnight, feels quite well,  Objective: Vitals:   06/12/19 0000 06/12/19 0405 06/12/19 0824 06/12/19 1037  BP:  114/67 123/76 108/74  Pulse:  82 84 85  Resp:  20 19   Temp:  97.8 F (36.6 C) 97.8 F (36.6 C)   TempSrc:  Oral Oral   SpO2: (!) 88% 92% 92%   Weight:      Height:        Intake/Output Summary (Last 24 hours) at 06/12/2019 1042 Last data filed at 06/12/2019 0856 Gross per 24 hour  Intake 120 ml  Output 725 ml  Net -605 ml   Filed Weights   06/02/19 0445  Weight: 85.8 kg    Gen: 73 y.o. male in no distress Pulm: Nonlabored, mildly tachypneic but comfortable. Crackles bilaterally CV: Regular without murmur, rub, or gallop. No JVD, no dependent edema GI: Abdomen soft, non-tender, non-distended, with normoactive bowel sounds Ext: Warm, no deformities Skin: No rashes, lesions or ulcers on visualized skin Neuro: Alert and oriented. No focal neurological deficits  Data Reviewed: I have personally  reviewed following labs and imaging studies  CBC: Recent Labs  Lab 06/06/19 0435 06/07/19 0330  WBC 9.2 11.3*  NEUTROABS 7.7 9.5*  HGB 13.7 13.0  HCT 39.4 38.4*  MCV 92.1 91.4  PLT 289 976   Basic Metabolic Panel: Recent Labs  Lab 06/06/19 0435 06/07/19 0330  NA 132* 127*  K 4.6 4.8  CL 98 94*  CO2 22 23  GLUCOSE 237* 227*  BUN 42* 44*  CREATININE 1.32* 1.42*  CALCIUM 8.6* 8.3*   GFR: Estimated Creatinine Clearance: 47.9 mL/min (A) (by C-G formula based on SCr of 1.42 mg/dL (H)).   Liver Function Tests: Recent Labs  Lab 06/06/19 0435 06/07/19 0330  AST 27 25  ALT 31 33  ALKPHOS 33* 33*  BILITOT 1.0 1.6*  PROT 6.4* 6.3*  ALBUMIN 3.0* 3.1*   CBG: Recent Labs  Lab 06/11/19 1229 06/11/19 1557 06/11/19 1755 06/11/19 1828 06/11/19 2050  GLUCAP 398* 490* 416* 432* 381*   Urine analysis:    Component Value Date/Time   COLORURINE YELLOW (A) 01/05/2016 0408   APPEARANCEUR CLEAR (A) 01/05/2016 0408   LABSPEC 1.020 01/05/2016 0408   PHURINE 5.0 01/05/2016 0408   GLUCOSEU 50 (A) 01/05/2016 0408   HGBUR NEGATIVE 01/05/2016 0408   BILIRUBINUR NEGATIVE 01/05/2016 0408   KETONESUR NEGATIVE 01/05/2016 0408   PROTEINUR 100 (A) 01/05/2016 0408  NITRITE NEGATIVE 01/05/2016 0408   LEUKOCYTESUR NEGATIVE 01/05/2016 0408   No results found for this or any previous visit (from the past 240 hour(s)).    Radiology Studies: No results found.  Scheduled Meds: . albuterol  2 puff Inhalation Q6H  . allopurinol  100 mg Oral Daily  . atorvastatin  20 mg Oral q1800  . dabigatran  150 mg Oral BID  . dexamethasone  6 mg Oral Q12H  . diltiazem  60 mg Oral Q12H  . fluticasone furoate-vilanterol  1 puff Inhalation Daily  . furosemide  20 mg Oral Daily  . insulin aspart  0-15 Units Subcutaneous TID WC  . insulin aspart  0-5 Units Subcutaneous QHS  . insulin glargine  60 Units Subcutaneous QHS  . metoprolol tartrate  50 mg Oral BID  . pantoprazole  40 mg Oral Daily  .  polyethylene glycol  34 g Oral BID  . tamsulosin  0.4 mg Oral QHS  . umeclidinium bromide  1 puff Inhalation Daily   Continuous Infusions: . sodium chloride Stopped (06/03/19 0934)     LOS: 10 days   Time spent: 35 minutes.  Little Ishikawa, DO [facility] [email or web address] 06/12/2019, 10:42 AM

## 2019-06-12 NOTE — Progress Notes (Signed)
This RN and patient facetimed with daughter Ivin Booty. Updates provided, no further questions. Pt personal cell phone charged and now at bedside to use at his leisure.

## 2019-06-12 NOTE — Progress Notes (Addendum)
Physical Therapy Treatment Patient Details Name: Jay Morris MRN: [id number] DOB: [birth year] Today's Date: 06/12/2019    History of Present Illness From MD H&P: Pt is a 73 y.o. male with a known history of asthma, atrial fibrillation, COPD, type 2 diabetes mellitus, hypertension and dyslipidemia, presented to the emergency room with acute onset of 2-day history of fever with a T-max of 103 and chills with associated body aching, generalized weakness, fatigue and malaise.  He admitted to dyspnea with paroxysmal nocturnal dyspnea and dyspnea on exertion without orthopnea or lower extremity edema.  He admitted to nausea and vomiting as well as watery diarrhea.  He admitted to mild epigastric and infraumbilical abdominal pain.  He lost about 10 pounds during the last 4 days.  He had mild dizziness without headache or diplopia.  Upon presentation to the emergency room, temperature was 99.3 respiratory to 25, heart rate 82, blood pressure 150/66 and pulse oximetry 91% on room air dropped to 82% upon ambulation for short distance.  CBC showed anemia with hemoglobin of 12.5 hematocrit of 37.1 better than his previous levels and very close to his baseline.  For which x-ray showed mild increase interstitial markings which could be due to interstitial pulmonary edema.  He will be admitted to a medical monitored bed for further evaluation and management.  MD assessment includes: Acute kidney injury superimposed on chronic kidney disease stage IIIa, N&V, diarrhea, DM II with hyperglycemia, A-fib, and general weakness.    PT Comments    Pt is steadily improving with mobility. He is needing increased encouragement as tends to fatigue very quickly. Pt is at SBA with most mobility w/ exception of ambulation which he completes with min guard assist. Pt was able to ambulate approx 38f with min guard assist this pm. Noted posture is slightly off with pt leaning back in stance and also with ambulation. Pt on 6L/min via  HFNC this pm.      Follow Up Recommendations  SNF     Equipment Recommendations  Rolling walker with 5" wheels;Other (comment)    Recommendations for Other Services       Precautions / Restrictions Precautions Precautions: Fall Restrictions Weight Bearing Restrictions: No    Mobility  Bed Mobility Overal bed mobility: Needs Assistance Bed Mobility: Sit to Supine;Supine to Sit     Supine to sit: Supervision Sit to supine: Supervision      Transfers Overall transfer level: Needs assistance Equipment used: None Transfers: Sit to/from Stand;Stand Pivot Transfers Sit to Stand: Supervision Stand pivot transfers: Supervision       General transfer comment: needs set up and line management   Ambulation/Gait Ambulation/Gait assistance: Supervision Gait Distance (Feet): 88 Feet Assistive device: None Gait Pattern/deviations: Step-through pattern     General Gait Details: 6L/min via HFNC for ambulation   Stairs             Wheelchair Mobility    Modified Rankin (Stroke Patients Only)       Balance Overall balance assessment: Needs assistance Sitting-balance support: Feet unsupported Sitting balance-Leahy Scale: Good     Standing balance support: During functional activity Standing balance-Leahy Scale: Fair                              Cognition Arousal/Alertness: Lethargic Behavior During Therapy: WFL for tasks assessed/performed Overall Cognitive Status: Within Functional Limits for tasks assessed  Exercises      General Comments        Pertinent Vitals/Pain Pain Assessment: No/denies pain Pain Intervention(s): Limited activity within patient's tolerance    Home Living                      Prior Function            PT Goals (current goals can now be found in the care plan section) Acute Rehab PT Goals Patient Stated Goal: none stated this session PT  Goal Formulation: With patient Time For Goal Achievement: 06/22/19 Potential to Achieve Goals: Fair Progress towards PT goals: Progressing toward goals(very steadily progressing with mobility)    Frequency    Min 2X/week      PT Plan Current plan remains appropriate    Co-evaluation              AM-PAC PT "6 Clicks" Mobility   Outcome Measure  Help needed turning from your back to your side while in a flat bed without using bedrails?: None Help needed moving from lying on your back to sitting on the side of a flat bed without using bedrails?: None Help needed moving to and from a bed to a chair (including a wheelchair)?: A Little Help needed standing up from a chair using your arms (e.g., wheelchair or bedside chair)?: A Little Help needed to walk in hospital room?: A Little Help needed climbing 3-5 steps with a railing? : A Lot 6 Click Score: 19    End of Session Equipment Utilized During Treatment: Oxygen Activity Tolerance: Patient limited by fatigue;Patient limited by lethargy;Treatment limited secondary to medical complications (Comment) Patient left: with call bell/phone within reach;in chair Nurse Communication: Mobility status PT Visit Diagnosis: Difficulty in walking, not elsewhere classified (R26.2);Muscle weakness (generalized) (M62.81)     Time: 0086-7619 PT Time Calculation (min) (ACUTE ONLY): 17 min  Charges:  $Gait Training: 8-22 mins                     Horald Chestnut, PT    Delford Field 06/12/2019, 5:03 PM

## 2019-06-12 NOTE — Progress Notes (Signed)
Results for BRANDIN, STETZER (MRN [id number]) as of 06/12/2019 12:26  Ref. Range 06/11/2019 17:55 06/11/2019 18:28 06/11/2019 20:50 06/12/2019 08:22 06/12/2019 12:09  Glucose-Capillary Latest Ref Range: 70 - 99 mg/dL 416 (H) 432 (H) 381 (H) 190 (H) 350 (H)  Noted that CBGs continue to be greater than 180 mg/dl with postprandial blood sugars greater than 300 mg/dl.  Noted that Lantus dosage has been changed to Lantus 40 units BID.   Recommend increasing Novolog correction scale to RESISTANT (0-20 units) TID and HS scale. May need to consider adding Novolog 5 units TID with meals if patient eats at least 50% of meal and postprandial blood sugars continue to be elevated.   Harvel Ricks RN BSN CDE Diabetes Coordinator Pager: [phone]  8am-5pm

## 2019-06-12 NOTE — Plan of Care (Signed)
  Problem: Safety: Goal: Ability to remain free from injury will improve Outcome: Progressing   Problem: Skin Integrity: Goal: Risk for impaired skin integrity will decrease Outcome: Progressing   Problem: Activity: Goal: Risk for activity intolerance will decrease Outcome: Progressing   Problem: Nutrition: Goal: Adequate nutrition will be maintained Outcome: Progressing   Problem: Coping: Goal: Level of anxiety will decrease Outcome: Progressing   Problem: Elimination: Goal: Will not experience complications related to bowel motility Outcome: Progressing   Problem: Pain Managment: Goal: General experience of comfort will improve Outcome: Progressing   Problem: Safety: Goal: Ability to remain free from injury will improve Outcome: Progressing   Problem: Skin Integrity: Goal: Risk for impaired skin integrity will decrease Outcome: Progressing

## 2019-06-13 DIAGNOSIS — M5136 Other intervertebral disc degeneration, lumbar region: Secondary | ICD-10-CM

## 2019-06-13 LAB — GLUCOSE, CAPILLARY
Glucose-Capillary: 223 mg/dL — ABNORMAL HIGH (ref 70–99)
Glucose-Capillary: 323 mg/dL — ABNORMAL HIGH (ref 70–99)
Glucose-Capillary: 435 mg/dL — ABNORMAL HIGH (ref 70–99)
Glucose-Capillary: 410 mg/dL — ABNORMAL HIGH (ref 70–99)

## 2019-06-13 MED ORDER — INSULIN GLARGINE 100 UNIT/ML ~~LOC~~ SOLN
45.0000 [IU] | Freq: Two times a day (BID) | SUBCUTANEOUS | Status: DC
  Administered 2019-06-13 – 2019-06-14 (×3): 45 [IU] via SUBCUTANEOUS
  Filled 2019-06-13 (×4): qty 0.45

## 2019-06-13 MED ORDER — INSULIN ASPART 100 UNIT/ML ~~LOC~~ SOLN
4.0000 [IU] | Freq: Three times a day (TID) | SUBCUTANEOUS | Status: DC
  Administered 2019-06-13 (×2): 4 [IU] via SUBCUTANEOUS

## 2019-06-13 MED ORDER — INSULIN ASPART 100 UNIT/ML ~~LOC~~ SOLN
6.0000 [IU] | Freq: Three times a day (TID) | SUBCUTANEOUS | Status: DC
  Administered 2019-06-13 – 2019-06-14 (×4): 6 [IU] via SUBCUTANEOUS

## 2019-06-13 MED ORDER — INSULIN ASPART 100 UNIT/ML ~~LOC~~ SOLN
0.0000 [IU] | Freq: Three times a day (TID) | SUBCUTANEOUS | Status: DC
  Administered 2019-06-13 (×2): 20 [IU] via SUBCUTANEOUS
  Administered 2019-06-13 – 2019-06-14 (×2): 7 [IU] via SUBCUTANEOUS
  Administered 2019-06-14: 15 [IU] via SUBCUTANEOUS
  Administered 2019-06-15: 3 [IU] via SUBCUTANEOUS
  Administered 2019-06-17: 7 [IU] via SUBCUTANEOUS

## 2019-06-13 MED ORDER — DILTIAZEM HCL ER 60 MG PO CP12
60.0000 mg | ORAL_CAPSULE | ORAL | Status: DC
  Administered 2019-06-14 – 2019-06-16 (×3): 60 mg via ORAL
  Filled 2019-06-13 (×5): qty 1

## 2019-06-13 NOTE — Progress Notes (Signed)
Results for QUINTERIOUS, WALRAVEN (MRN [id number]) as of 06/13/2019 11:27  Ref. Range 06/12/2019 12:09 06/12/2019 16:45 06/12/2019 20:40 06/13/2019 07:28 06/13/2019 11:07  Glucose-Capillary Latest Ref Range: 70 - 99 mg/dL 350 (H) 403 (H) 440 (H) 223 (H) 323 (H)  Noted that blood sugars have been 300's and 400's.    Recommend increasing Lantus to 45 units BID and increase Novolog meal coverage to 6 units TID if blood sugars continue to be elevated. Continue Novolog RESISTANT correction scale as ordered.  Harvel Ricks RN BSN CDE Diabetes Coordinator Pager: [phone]  8am-5pm

## 2019-06-13 NOTE — Progress Notes (Signed)
Wife called per Rn. No further concerns

## 2019-06-13 NOTE — Progress Notes (Addendum)
PROGRESS NOTE                                                                                                                                                                                                             Patient Demographics:    Jay Morris, is a 73 y.o. male, DOB - [birth year], TGP:[id number]  Outpatient Primary MD for the patient is [name], MD   Admit date - 06/02/2019   LOS - 11  No chief complaint on file.      Brief Narrative: Patient is a 73 y.o. male with PMHx of COPD, bronchial asthma, PAF on Pradaxa, DM-2, HTN-who presented with fever, chills and worsening shortness of breath-he was found to have acute hypoxic respiratory failure secondary to COVID-19 pneumonia.  Hospital course was complicated by progressively worsening hypoxemia requiring 15 L of high flow oxygen and A. fib with RVR.  Patient was treated with steroids, remdesivir, convalescent plasma with slow clinical improvement.   Subjective:    [name] today was sitting at bedside chair-he had no complaints.   Assessment  & Plan :   Acute Hypoxic Resp Failure due to Covid 19 Viral pneumonia: Improving-remains stable on 6 L of oxygen.  Has completed a course of steroid and remdesivir.  Continue attempts to slowly titrate down FiO2.  Fever: afebrile  O2 requirements:  SpO2: (!) 87 % O2 Flow Rate (L/min): 5 L/min   COVID-19 Labs: No results for input(s): DDIMER, FERRITIN, LDH, CRP in the last 72 hours.  Lab Results  Component Value Date   [place] NEGATIVE 05/31/2019     COVID-19 Medications: Steroids: 11/11>> 11/24 Remdesivir: 11/13>> 11/17 Actemra: Not given Convalescent Plasma: 11/15 x 1  Other medications: Diuretics:Euvolemic-on oral daily Lasix Antibiotics:Not needed as no evidence of bacterial infection  Prone/Incentive Spirometry: encouraged  incentive spirometry use 3-4/hour.  DVT Prophylaxis  :  Pradaxa  PAF with RVR: Rate controlled-continue metoprolol 50 mg twice daily, change Cardizem to daily dosing.  Anticoagulated with Pradaxa  AKI on CKD stage IIIa: AKI improved-creatinine back to baseline-follow periodically  DM-2: CBGs remain uncontrolled-increase Lantus to 45 units twice daily, change premeal NovoLog to 6 units-continue resistant SSI and follow  CBG (last 3)  Recent Labs    06/12/19 2040 06/13/19 0728 06/13/19 1107  GLUCAP 440* 223* 323*   DM: Controlled-continue metoprolol,  Cardizem and Lasix.  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  Consults  :  None  Procedures  :  None  ABG: No results found for: PHART, PCO2ART, PO2ART, HCO3, TCO2, ACIDBASEDEF, O2SAT  Vent Settings: N/A  Condition - Stable  Family Communication  :  Spouse updated over the phone  Code Status :  Full Code  Diet :  Diet Order            Diet Carb Modified Fluid consistency: Thin; Room service appropriate? Yes  Diet effective now               Disposition Plan  :  Remain hospitalized-  Barriers to discharge: Hypoxia requiring O2 supplementation  Antimicorbials  :    Anti-infectives (From admission, onward)   Start     Dose/Rate Route Frequency Ordered Stop   06/03/19 1000  remdesivir 100 mg in sodium chloride 0.9 % 250 mL IVPB     100 mg 500 mL/hr over 30 Minutes Intravenous Every 24 hours 06/02/19 0511 06/05/19 1129   06/02/19 1600  remdesivir 100 mg in sodium chloride 0.9 % 250 mL IVPB     100 mg 500 mL/hr over 30 Minutes Intravenous Every 24 hours 06/02/19 0511 06/02/19 1657      Inpatient Medications  Scheduled Meds: . albuterol  2 puff Inhalation Q6H   . allopurinol  100 mg Oral Daily  . atorvastatin  20 mg Oral q1800  . dabigatran  150 mg Oral BID  . diltiazem  60 mg Oral Q12H  . fluticasone furoate-vilanterol  1 puff Inhalation Daily  . furosemide  20 mg Oral Daily  . insulin aspart  0-20 Units Subcutaneous TID WC  . insulin aspart  0-5 Units Subcutaneous QHS  . insulin aspart  4 Units Subcutaneous TID WC  . insulin glargine  40 Units Subcutaneous BID  . metoprolol tartrate  50 mg Oral BID  . pantoprazole  40 mg Oral Daily  . polyethylene glycol  34 g Oral BID  . tamsulosin  0.4 mg Oral QHS  . umeclidinium bromide  1 puff Inhalation Daily   Continuous Infusions: . sodium chloride Stopped (06/03/19 0934)   PRN Meds:.sodium chloride, acetaminophen, alum & mag hydroxide-simeth, chlorpheniramine-HYDROcodone, diphenhydrAMINE, guaiFENesin-dextromethorphan, metoprolol tartrate, ondansetron **OR** ondansetron (ZOFRAN) IV, senna, sodium chloride, traMADol   Time Spent in minutes  25  See all Orders from today for further details   Oren Binet M.D on 06/13/2019 at 4:53 PM  To page go to [email or web address] - use universal password  [facility] -  Office  [phone]    Objective:   Vitals:   06/12/19 2047 06/13/19 0300 06/13/19 0729 06/13/19 1600  BP: (!) 136/55 115/72 131/76 133/64  Pulse: 72 85 86 (!) 57  Resp: _0 Temp: (!) 97.5 F (36.4 C) (!) 97.5 F (36.4 C) 97.8 F (36.6 C) 98.6 F (37 C)  TempSrc: Oral Oral Oral Oral  SpO2: 92% 90% 90% (!) 87%  Weight:      Height:        Wt Readings from Last 3 Encounters:  06/02/19 85.8 kg  06/01/19 84.5 kg  12/29/17 87.3 kg    No intake or output data in the 24 hours ending 06/13/19 1653   Physical Exam Gen  Exam:Alert awake-not in any distress HEENT:atraumatic, normocephalic Chest: B/L clear to auscultation anteriorly CVS:S1S2 regular Abdomen:soft non tender, non distended Extremities:no edema Neurology: Non focal Skin: no rash   Data Review:     CBC Recent Labs  Lab 06/07/19 0330  WBC 11.3*  HGB 13.0  HCT 38.4*  PLT 335  MCV 91.4  MCH 31.0  MCHC 33.9  RDW 13.6  LYMPHSABS 0.9  MONOABS 0.8  EOSABS 0.0  BASOSABS 0.0    Chemistries  Recent Labs  Lab 06/07/19 0330  NA 127*  K 4.8  CL 94*  CO2 23  GLUCOSE 227*  BUN 44*  CREATININE 1.42*  CALCIUM 8.3*  AST 25  ALT 33  ALKPHOS 33*  BILITOT 1.6*   ------------------------------------------------------------------------------------------------------------------ No results for input(s): CHOL, HDL, LDLCALC, TRIG, CHOLHDL, LDLDIRECT in the last 72 hours.  Lab Results  Component Value Date   HGBA1C 7.5 (H) 05/31/2019   ------------------------------------------------------------------------------------------------------------------ No results for input(s): TSH, T4TOTAL, T3FREE, THYROIDAB in the last 72 hours.  Invalid input(s): FREET3 ------------------------------------------------------------------------------------------------------------------ No results for input(s): VITAMINB12, FOLATE, FERRITIN, TIBC, IRON, RETICCTPCT in the last 72 hours.  Coagulation profile No results for input(s): INR, PROTIME in the last 168 hours.  No results for input(s): DDIMER in the last 72 hours.  Cardiac Enzymes No results for input(s): CKMB, TROPONINI, MYOGLOBIN in the last 168 hours.  Invalid input(s): CK ------------------------------------------------------------------------------------------------------------------    Component Value Date/Time   BNP 37.0 05/31/2019 0616    Micro Results No results found for this or any previous visit (from the past 240 hour(s)).  Radiology Reports Ct Abdomen Pelvis Wo Contrast  Result Date: 05/31/2019 CLINICAL DATA:  Weight loss, unintended, non-localized Abdominal pain, nausea vomiting and diarrhea. Shortness of breath. Fevers and chills over the last 2 days. Hypoxia EXAM: CT ABDOMEN AND PELVIS WITHOUT CONTRAST TECHNIQUE:  Multidetector CT imaging of the abdomen and pelvis was performed following the standard protocol without IV contrast. COMPARISON:  CT of the abdomen and pelvis 11/21/2017 FINDINGS: Lower chest: Scarring of the lingula and right middle lobe is stable. Minimal dependent atelectasis is present. Heart size is normal. Hepatobiliary: There is diffuse fatty infiltration of the liver. No discrete lesions are present. The common bile duct and gallbladder are normal. Pancreas: Unremarkable. No pancreatic ductal dilatation or surrounding inflammatory changes. Spleen: Normal in size without focal abnormality. Adrenals/Urinary Tract: Adrenal glands are normal bilaterally. Kidneys and ureters are within normal limits. There is no stone or mass lesion. No obstruction is present. The urinary bladder is distended. No focal abnormality is present. Stomach/Bowel: The stomach and duodenum are within normal limits. Small bowel is unremarkable. Terminal ileum is within normal limits. Appendix is visualized and normal. The ascending and transverse colon are normal. The descending and sigmoid colon are within normal limits. Vascular/Lymphatic: Atherosclerotic calcifications are present in the aorta and branch vessels 50% narrowing is present at the origin of the SMA. Moderate stenosis is present at the origin of the IMA. Dense atherosclerotic calcifications are present in the proximal iliac arteries bilaterally. Reproductive: Prostate is unremarkable. Other: No abdominal wall hernia or abnormality. No abdominopelvic ascites. Musculoskeletal: Advanced degenerative changes are present in the lower lumbar spine. No acute abnormalities are present. Bony pelvis is within normal limits. The hips are located and within normal limits. IMPRESSION: 1. No acute or focal lesion to explain the patient's symptoms. 2. Hepatic steatosis. 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 4. Aortic Atherosclerosis  (  ICD10-I70.0). Electronically Signed   By: San Morelle M.D.   On: 05/31/2019 07:11   Dg Abd 1 View  Result Date: 06/08/2019 CLINICAL DATA:  Constipation EXAM: ABDOMEN - 1 VIEW COMPARISON:  CT abdomen pelvis-05/31/2019; chest radiograph-earlier same day FINDINGS: Moderate gaseous distention of the colon without upstream distension of the small bowel. Unremarkable colonic stool burden. Nondiagnostic evaluation for pneumoperitoneum secondary to supine positioning and exclusion of the lower thorax. No pneumatosis or portal venous gas. No definitive abnormal intra-abdominal calcifications. Degenerative change the lower lumbar spine and bilateral hips is suspected though incompletely evaluated. IMPRESSION: 1. Mild gaseous distention of the colon without evidence of enteric obstruction. 2. Unremarkable colonic stool burden. Electronically Signed   By: Sandi Mariscal M.D.   On: 06/08/2019 08:33   Nm Pulmonary Perf And Vent  Result Date: 06/01/2019 CLINICAL DATA:  Hypoxemia, shortness of breath since Tuesday, COPD, asthma, question pulmonary embolism EXAM: NUCLEAR MEDICINE VENTILATION - PERFUSION LUNG SCAN TECHNIQUE: Ventilation images were obtained in multiple projections using inhaled aerosol Tc-22mDTPA. Perfusion images were obtained in multiple projections after intravenous injection of Tc-937mAA. RADIOPHARMACEUTICALS:  31.98 mCi of Tc-9925mPA aerosol inhalation and 4.12 mCi Tc99m70m IV COMPARISON:  None Correlation: Chest radiograph 05/31/2019 FINDINGS: Ventilation: Overall did irregular diminished ventilation within the upper lobes. Focal ventilatory defect lateral aspect RIGHT upper lobe. Prominent LEFT hilum. Perfusion: Matching perfusion defect lateral RIGHT upper lobe. Remaining perfusion normal. No additional segmental or subsegmental perfusion defects. Chest radiograph: Scattered interstitial prominence in the periphery of the lungs bilaterally greater in RIGHT upper lobe at site of  ventilation and perfusion abnormality. IMPRESSION: Low probability for pulmonary embolism. Electronically Signed   By: MarkLavonia Dana.   On: 06/01/2019 08:21   Dg Chest Portable 1 View  Result Date: 05/31/2019 CLINICAL DATA:  Shortness of breath EXAM: PORTABLE CHEST 1 VIEW COMPARISON:  January 05, 2016 FINDINGS: The heart size and mediastinal contours are within normal limits. Aortic knob calcifications are seen. There is minimally increased interstitial markings seen at the periphery. There is bibasilar subsegmental atelectasis. No large airspace consolidation or pleural effusion. No acute osseous abnormality. IMPRESSION: Mildly increased interstitial markings which could be due to interstitial edema. Electronically Signed   By: BindPrudencio Pair.   On: 05/31/2019 03:52

## 2019-06-14 LAB — BASIC METABOLIC PANEL
Anion gap: 8 (ref 5–15)
BUN: 53 mg/dL — ABNORMAL HIGH (ref 8–23)
CO2: 28 mmol/L (ref 22–32)
Calcium: 8.3 mg/dL — ABNORMAL LOW (ref 8.9–10.3)
Chloride: 97 mmol/L — ABNORMAL LOW (ref 98–111)
Creatinine, Ser: 1.59 mg/dL — ABNORMAL HIGH (ref 0.61–1.24)
GFR calc Af Amer: 48 mL/min — ABNORMAL LOW (ref >60)
GFR calc non Af Amer: 42 mL/min — ABNORMAL LOW (ref >60)
Glucose, Bld: 126 mg/dL — ABNORMAL HIGH (ref 70–99)
Potassium: 4.4 mmol/L (ref 3.5–5.1)
Sodium: 133 mmol/L — ABNORMAL LOW (ref 135–145)

## 2019-06-14 LAB — GLUCOSE, CAPILLARY
Glucose-Capillary: 81 mg/dL (ref 70–99)
Glucose-Capillary: 242 mg/dL — ABNORMAL HIGH (ref 70–99)
Glucose-Capillary: 341 mg/dL — ABNORMAL HIGH (ref 70–99)
Glucose-Capillary: 203 mg/dL — ABNORMAL HIGH (ref 70–99)

## 2019-06-14 MED ORDER — PANTOPRAZOLE SODIUM 40 MG PO TBEC
40.0000 mg | DELAYED_RELEASE_TABLET | Freq: Two times a day (BID) | ORAL | Status: DC
  Administered 2019-06-14 – 2019-06-25 (×23): 40 mg via ORAL
  Filled 2019-06-14 (×23): qty 1

## 2019-06-14 MED ORDER — PROMETHAZINE HCL 25 MG/ML IJ SOLN
12.5000 mg | Freq: Four times a day (QID) | INTRAMUSCULAR | Status: DC | PRN
  Administered 2019-06-14: 12.5 mg via INTRAVENOUS
  Filled 2019-06-14: qty 1

## 2019-06-14 NOTE — Progress Notes (Signed)
Pt. Talked to his family multiple times today. Still complaining of nausea/ burning/ stomach upset. Md aware of it

## 2019-06-14 NOTE — Progress Notes (Signed)
PROGRESS NOTE                                                                                                                                                                                                             Patient Demographics:    Jay Morris, is a 73 y.o. male, DOB - 1946/01/26, TFT:[id number]  Outpatient Primary MD for the patient is [name], MD   Admit date - 06/02/2019   LOS - 12  No chief complaint on file.      Brief Narrative: Patient is a 73 y.o. male with PMHx of COPD, bronchial asthma, PAF on Pradaxa, DM-2, HTN-who presented with fever, chills and worsening shortness of breath-he was found to have acute hypoxic respiratory failure secondary to COVID-19 pneumonia.  Hospital course was complicated by progressively worsening hypoxemia requiring 15 L of high flow oxygen and A. fib with RVR.  Patient was treated with steroids, remdesivir, convalescent plasma with slow clinical improvement.   Subjective:    Jay Morris today was complaining of mild nausea but was otherwise comfortable.  He denied any chest pain or shortness of breath.   Assessment  & Plan :   Acute Hypoxic Resp Failure due to Covid 19 Viral pneumonia: Slowly improving-has been titrated down to 5 L of oxygen.  Has completed a course of steroid and remdesivir.  Continue at attempts to slowly titrate down FiO2.     Fever: afebrile  O2 requirements:  SpO2: 92 % O2 Flow Rate (L/min): 5 L/min   COVID-19 Labs: No results for input(s): DDIMER, FERRITIN, LDH, CRP in the last 72 hours.  Lab Results  Component Value Date   [place] NEGATIVE 05/31/2019     COVID-19 Medications: Steroids: 11/11>> 11/24 Remdesivir: 11/13>> 11/17 Actemra: Not given Convalescent Plasma: 11/15 x 1  Other medications: Diuretics:Euvolemic-on oral daily Lasix Antibiotics:Not needed as no evidence of bacterial infection  Prone/Incentive  Spirometry: encouraged  incentive spirometry use 3-4/hour.  DVT Prophylaxis  : Pradaxa  PAF with RVR: Rate controlled-continue metoprolol 50 mg twice daily, change Cardizem to daily dosing.  Anticoagulated with Pradaxa  AKI on CKD stage IIIa: AKI improved-creatinine back to baseline-follow periodically  DM-2: CBGs remain relatively stable-CBG 81 this morning- will need to watch closely but for now suspect we could continue Lantus 45 units twice daily, premeal NovoLog 6 units  with meals-and resistant SSI.  Follow and adjust.    CBG (last 3)  Recent Labs    06/13/19 2027 06/14/19 0750 06/14/19 1221  GLUCAP 410* 81 242*   HTN: Controlled-continue metoprolol, Cardizem and Lasix.  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  Consults  :  None  Procedures  :  None  ABG: No results found for: PHART, PCO2ART, PO2ART, HCO3, TCO2, ACIDBASEDEF, O2SAT  Vent Settings: N/A  Condition - Stable  Family Communication  :  Spouse updated over the phone on 11/26  Code Status :  Full Code  Diet :  Diet Order            Diet Carb Modified Fluid consistency: Thin; Room service appropriate? Yes  Diet effective now               Disposition Plan  :  Remain hospitalized-SNF on discharge when bed available  Barriers to discharge: Hypoxia requiring O2 supplementation  Antimicorbials  :    Anti-infectives (From admission, onward)   Start     Dose/Rate Route Frequency Ordered Stop   06/03/19 1000  remdesivir 100 mg in sodium chloride 0.9 % 250 mL IVPB     100 mg 500 mL/hr over 30 Minutes Intravenous Every 24 hours 06/02/19 0511 06/05/19 1129   06/02/19 1600  remdesivir 100 mg in  sodium chloride 0.9 % 250 mL IVPB     100 mg 500 mL/hr over 30 Minutes Intravenous Every 24 hours 06/02/19 0511 06/02/19 1657      Inpatient Medications  Scheduled Meds: . albuterol  2 puff Inhalation Q6H  . allopurinol  100 mg Oral Daily  . atorvastatin  20 mg Oral q1800  . dabigatran  150 mg Oral BID  . diltiazem  60 mg Oral Q24H  . fluticasone furoate-vilanterol  1 puff Inhalation Daily  . furosemide  20 mg Oral Daily  . insulin aspart  0-20 Units Subcutaneous TID WC  . insulin aspart  0-5 Units Subcutaneous QHS  . insulin aspart  6 Units Subcutaneous TID WC  . insulin glargine  45 Units Subcutaneous BID  . metoprolol tartrate  50 mg Oral BID  . pantoprazole  40 mg Oral Daily  . polyethylene glycol  34 g Oral BID  . tamsulosin  0.4 mg Oral QHS  . umeclidinium bromide  1 puff Inhalation Daily   Continuous Infusions: . sodium chloride Stopped (06/03/19 0934)   PRN Meds:.sodium chloride, acetaminophen, alum & mag hydroxide-simeth, chlorpheniramine-HYDROcodone, diphenhydrAMINE, guaiFENesin-dextromethorphan, metoprolol tartrate, ondansetron **OR** ondansetron (ZOFRAN) IV, promethazine, senna, sodium chloride, traMADol   Time Spent in minutes  25  See all Orders from today for further details   Oren Binet M.D on 06/14/2019 at 3:31 PM  To page go to [email or web address] - use universal password  [facility] -  Office  [phone]    Objective:   Vitals:   06/13/19 1600 06/13/19 2029 06/14/19 0400 06/14/19 0748  BP: 133/64 116/83 107/67 101/61  Pulse: (!) 57 68 87 86  Resp: _0 Temp: 98.6 F (37 C) 97.6 F (36.4 C) 98 F (36.7 C) 98.3 F (36.8 C)  TempSrc: Oral Oral Oral Oral  SpO2: (!) 87% (!) 89% 90% 92%  Weight:      Height:  Wt Readings from Last 3 Encounters:  06/02/19 85.8 kg  06/01/19 84.5 kg  12/29/17 87.3 kg    No intake or output data in the 24 hours ending 06/14/19 1531   Physical Exam Gen Exam:Alert awake-not in any  distress HEENT:atraumatic, normocephalic Chest: B/L clear to auscultation anteriorly CVS:S1S2 regular Abdomen:soft non tender, non distended Extremities:no edema Neurology: Non focal Skin: no rash   Data Review:    CBC No results for input(s): WBC, HGB, HCT, PLT, MCV, MCH, MCHC, RDW, LYMPHSABS, MONOABS, EOSABS, BASOSABS, BANDABS in the last 168 hours.  Invalid input(s): NEUTRABS, BANDSABD  Chemistries  Recent Labs  Lab 06/14/19 0220  NA 133*  K 4.4  CL 97*  CO2 28  GLUCOSE 126*  BUN 53*  CREATININE 1.59*  CALCIUM 8.3*   ------------------------------------------------------------------------------------------------------------------ No results for input(s): CHOL, HDL, LDLCALC, TRIG, CHOLHDL, LDLDIRECT in the last 72 hours.  Lab Results  Component Value Date   HGBA1C 7.5 (H) 05/31/2019   ------------------------------------------------------------------------------------------------------------------ No results for input(s): TSH, T4TOTAL, T3FREE, THYROIDAB in the last 72 hours.  Invalid input(s): FREET3 ------------------------------------------------------------------------------------------------------------------ No results for input(s): VITAMINB12, FOLATE, FERRITIN, TIBC, IRON, RETICCTPCT in the last 72 hours.  Coagulation profile No results for input(s): INR, PROTIME in the last 168 hours.  No results for input(s): DDIMER in the last 72 hours.  Cardiac Enzymes No results for input(s): CKMB, TROPONINI, MYOGLOBIN in the last 168 hours.  Invalid input(s): CK ------------------------------------------------------------------------------------------------------------------    Component Value Date/Time   BNP 37.0 05/31/2019 0616    Micro Results No results found for this or any previous visit (from the past 240 hour(s)).  Radiology Reports Ct Abdomen Pelvis Wo Contrast  Result Date: 05/31/2019 CLINICAL DATA:  Weight loss, unintended, non-localized  Abdominal pain, nausea vomiting and diarrhea. Shortness of breath. Fevers and chills over the last 2 days. Hypoxia EXAM: CT ABDOMEN AND PELVIS WITHOUT CONTRAST TECHNIQUE: Multidetector CT imaging of the abdomen and pelvis was performed following the standard protocol without IV contrast. COMPARISON:  CT of the abdomen and pelvis 11/21/2017 FINDINGS: Lower chest: Scarring of the lingula and right middle lobe is stable. Minimal dependent atelectasis is present. Heart size is normal. Hepatobiliary: There is diffuse fatty infiltration of the liver. No discrete lesions are present. The common bile duct and gallbladder are normal. Pancreas: Unremarkable. No pancreatic ductal dilatation or surrounding inflammatory changes. Spleen: Normal in size without focal abnormality. Adrenals/Urinary Tract: Adrenal glands are normal bilaterally. Kidneys and ureters are within normal limits. There is no stone or mass lesion. No obstruction is present. The urinary bladder is distended. No focal abnormality is present. Stomach/Bowel: The stomach and duodenum are within normal limits. Small bowel is unremarkable. Terminal ileum is within normal limits. Appendix is visualized and normal. The ascending and transverse colon are normal. The descending and sigmoid colon are within normal limits. Vascular/Lymphatic: Atherosclerotic calcifications are present in the aorta and branch vessels 50% narrowing is present at the origin of the SMA. Moderate stenosis is present at the origin of the IMA. Dense atherosclerotic calcifications are present in the proximal iliac arteries bilaterally. Reproductive: Prostate is unremarkable. Other: No abdominal wall hernia or abnormality. No abdominopelvic ascites. Musculoskeletal: Advanced degenerative changes are present in the lower lumbar spine. No acute abnormalities are present. Bony pelvis is within normal limits. The hips are located and within normal limits. IMPRESSION: 1. No acute or focal lesion to  explain the patient's symptoms. 2. Hepatic steatosis. 3. 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 4. Aortic Atherosclerosis (ICD10-I70.0). Electronically Signed   By: San Morelle M.D.   On: 05/31/2019 07:11   Dg Abd 1 View  Result Date: 06/08/2019 CLINICAL DATA:  Constipation EXAM: ABDOMEN - 1 VIEW COMPARISON:  CT abdomen pelvis-05/31/2019; chest radiograph-earlier same day FINDINGS: Moderate gaseous distention of the colon without upstream distension of the small bowel. Unremarkable colonic stool burden. Nondiagnostic evaluation for pneumoperitoneum secondary to supine positioning and exclusion of the lower thorax. No pneumatosis or portal venous gas. No definitive abnormal intra-abdominal calcifications. Degenerative change the lower lumbar spine and bilateral hips is suspected though incompletely evaluated. IMPRESSION: 1. Mild gaseous distention of the colon without evidence of enteric obstruction. 2. Unremarkable colonic stool burden. Electronically Signed   By: Sandi Mariscal M.D.   On: 06/08/2019 08:33   Nm Pulmonary Perf And Vent  Result Date: 06/01/2019 CLINICAL DATA:  Hypoxemia, shortness of breath since Tuesday, COPD, asthma, question pulmonary embolism EXAM: NUCLEAR MEDICINE VENTILATION - PERFUSION LUNG SCAN TECHNIQUE: Ventilation images were obtained in multiple projections using inhaled aerosol Tc-64mDTPA. Perfusion images were obtained in multiple projections after intravenous injection of Tc-972mAA. RADIOPHARMACEUTICALS:  31.98 mCi of Tc-9920mPA aerosol inhalation and 4.12 mCi Tc99m40m IV COMPARISON:  None Correlation: Chest radiograph 05/31/2019 FINDINGS: Ventilation: Overall did irregular diminished ventilation within the upper lobes. Focal ventilatory defect lateral aspect RIGHT upper lobe. Prominent LEFT hilum. Perfusion: Matching perfusion defect lateral RIGHT upper lobe. Remaining perfusion normal. No additional segmental or  subsegmental perfusion defects. Chest radiograph: Scattered interstitial prominence in the periphery of the lungs bilaterally greater in RIGHT upper lobe at site of ventilation and perfusion abnormality. IMPRESSION: Low probability for pulmonary embolism. Electronically Signed   By: MarkLavonia Dana.   On: 06/01/2019 08:21   Dg Chest Portable 1 View  Result Date: 05/31/2019 CLINICAL DATA:  Shortness of breath EXAM: PORTABLE CHEST 1 VIEW COMPARISON:  January 05, 2016 FINDINGS: The heart size and mediastinal contours are within normal limits. Aortic knob calcifications are seen. There is minimally increased interstitial markings seen at the periphery. There is bibasilar subsegmental atelectasis. No large airspace consolidation or pleural effusion. No acute osseous abnormality. IMPRESSION: Mildly increased interstitial markings which could be due to interstitial edema. Electronically Signed   By: BindPrudencio Pair.   On: 05/31/2019 03:52

## 2019-06-15 ENCOUNTER — Inpatient Hospital Stay (HOSPITAL_COMMUNITY): Payer: Medicare HMO

## 2019-06-15 LAB — CBC
HCT: 43.8 % (ref 39.0–52.0)
Hemoglobin: 14.9 g/dL (ref 13.0–17.0)
MCH: 32.4 pg (ref 26.0–34.0)
MCHC: 34 g/dL (ref 30.0–36.0)
MCV: 95.2 fL (ref 80.0–100.0)
Platelets: 188 10*3/uL (ref 150–400)
RBC: 4.6 MIL/uL (ref 4.22–5.81)
RDW: 14.6 % (ref 11.5–15.5)
WBC: 11.8 10*3/uL — ABNORMAL HIGH (ref 4.0–10.5)
nRBC: 0 % (ref 0.0–0.2)

## 2019-06-15 LAB — GLUCOSE, CAPILLARY
Glucose-Capillary: 143 mg/dL — ABNORMAL HIGH (ref 70–99)
Glucose-Capillary: 145 mg/dL — ABNORMAL HIGH (ref 70–99)
Glucose-Capillary: 148 mg/dL — ABNORMAL HIGH (ref 70–99)
Glucose-Capillary: 37 mg/dL — CL (ref 70–99)
Glucose-Capillary: 60 mg/dL — ABNORMAL LOW (ref 70–99)
Glucose-Capillary: 76 mg/dL (ref 70–99)
Glucose-Capillary: 86 mg/dL (ref 70–99)

## 2019-06-15 LAB — COMPREHENSIVE METABOLIC PANEL
ALT: 59 U/L — ABNORMAL HIGH (ref 0–44)
AST: 32 U/L (ref 15–41)
Albumin: 2.8 g/dL — ABNORMAL LOW (ref 3.5–5.0)
Alkaline Phosphatase: 33 U/L — ABNORMAL LOW (ref 38–126)
Anion gap: 7 (ref 5–15)
BUN: 42 mg/dL — ABNORMAL HIGH (ref 8–23)
CO2: 29 mmol/L (ref 22–32)
Calcium: 7.8 mg/dL — ABNORMAL LOW (ref 8.9–10.3)
Chloride: 96 mmol/L — ABNORMAL LOW (ref 98–111)
Creatinine, Ser: 1.68 mg/dL — ABNORMAL HIGH (ref 0.61–1.24)
GFR calc Af Amer: 45 mL/min — ABNORMAL LOW (ref >60)
GFR calc non Af Amer: 39 mL/min — ABNORMAL LOW (ref >60)
Glucose, Bld: 150 mg/dL — ABNORMAL HIGH (ref 70–99)
Potassium: 4.4 mmol/L (ref 3.5–5.1)
Sodium: 132 mmol/L — ABNORMAL LOW (ref 135–145)
Total Bilirubin: 1.7 mg/dL — ABNORMAL HIGH (ref 0.3–1.2)
Total Protein: 5.6 g/dL — ABNORMAL LOW (ref 6.5–8.1)

## 2019-06-15 LAB — LIPASE, BLOOD: Lipase: 35 U/L (ref 11–51)

## 2019-06-15 MED ORDER — FLEET ENEMA 7-19 GM/118ML RE ENEM
1.0000 | ENEMA | Freq: Once | RECTAL | Status: AC | PRN
  Administered 2019-06-15: 1 via RECTAL
  Filled 2019-06-15: qty 1

## 2019-06-15 MED ORDER — BISACODYL 10 MG RE SUPP
10.0000 mg | Freq: Every day | RECTAL | Status: DC | PRN
  Administered 2019-06-15 – 2019-06-19 (×3): 10 mg via RECTAL
  Filled 2019-06-15 (×4): qty 1

## 2019-06-15 MED ORDER — FINASTERIDE 5 MG PO TABS
5.0000 mg | ORAL_TABLET | Freq: Every day | ORAL | Status: DC
  Administered 2019-06-15 – 2019-06-25 (×11): 5 mg via ORAL
  Filled 2019-06-15 (×11): qty 1

## 2019-06-15 MED ORDER — INSULIN GLARGINE 100 UNIT/ML ~~LOC~~ SOLN
35.0000 [IU] | Freq: Two times a day (BID) | SUBCUTANEOUS | Status: DC
  Filled 2019-06-15 (×2): qty 0.35

## 2019-06-15 MED ORDER — SENNA 8.6 MG PO TABS
2.0000 | ORAL_TABLET | Freq: Every day | ORAL | Status: DC
  Administered 2019-06-16: 17.2 mg via ORAL
  Filled 2019-06-15: qty 2

## 2019-06-15 MED ORDER — TAMSULOSIN HCL 0.4 MG PO CAPS
0.8000 mg | ORAL_CAPSULE | Freq: Every day | ORAL | Status: DC
  Administered 2019-06-15 – 2019-06-18 (×4): 0.8 mg via ORAL
  Filled 2019-06-15 (×4): qty 2

## 2019-06-15 MED ORDER — FLEET ENEMA 7-19 GM/118ML RE ENEM: 1.0000 | ENEMA | Freq: Once | RECTAL | Status: DC

## 2019-06-15 MED ORDER — POLYETHYLENE GLYCOL 3350 17 G PO PACK
34.0000 g | PACK | Freq: Three times a day (TID) | ORAL | Status: DC
  Administered 2019-06-15 – 2019-06-16 (×5): 34 g via ORAL
  Filled 2019-06-15 (×4): qty 2

## 2019-06-15 MED ORDER — INSULIN ASPART 100 UNIT/ML ~~LOC~~ SOLN
4.0000 [IU] | Freq: Three times a day (TID) | SUBCUTANEOUS | Status: DC
  Administered 2019-06-15: 4 [IU] via SUBCUTANEOUS

## 2019-06-15 NOTE — Progress Notes (Signed)
PROGRESS NOTE                                                                                                                                                                                                             Patient Demographics:    Jay Morris, is a 73 y.o. male, DOB - [birth year], QQI:[id number]  Outpatient Primary MD for the patient is [name], MD   Admit date - 06/02/2019   LOS - 13  No chief complaint on file.      Brief Narrative: Patient is a 73 y.o. male with PMHx of COPD, bronchial asthma, PAF on Pradaxa, DM-2, HTN-who presented with fever, chills and worsening shortness of breath-he was found to have acute hypoxic respiratory failure secondary to COVID-19 pneumonia.  Hospital course was complicated by progressively worsening hypoxemia requiring 15 L of high flow oxygen and A. fib with RVR.  Patient was treated with steroids, remdesivir, convalescent plasma with slow clinical improvement.   Subjective:    Jay Morris has not had a bowel movement in the past 3 days.  He complains of some burning sensation in the middle of his stomach that has been ongoing for the past 3 days.  Some nausea.   Assessment  & Plan :   Acute Hypoxic Resp Failure due to Covid 19 Viral pneumonia: Slowly continues to improve-has been titrated down to 4 L.  Has completed a course of steroid and remdesivir.  Continue at attempts to titrate him off oxygen as much as possible.     Fever: afebrile  O2 requirements:  SpO2: 94 % O2 Flow Rate (L/min): 4 L/min   COVID-19 Labs: No results for input(s): DDIMER, FERRITIN, LDH, CRP in the last 72 hours.  Lab Results  Component Value Date   [place] NEGATIVE 05/31/2019     COVID-19 Medications: Steroids: 11/11>> 11/24 Remdesivir: 11/13>> 11/17 Actemra: Not given Convalescent Plasma: 11/15 x 1  Other medications: Diuretics:Euvolemic-on oral daily  Lasix Antibiotics:Not needed as no evidence of bacterial infection  Prone/Incentive Spirometry: encouraged  incentive spirometry use 3-4/hour.  DVT Prophylaxis  : Pradaxa  Abdominal pain: Benign physical exam-already on PPI-has not had a bowel movement in 3 days-no response to Dulcolax suppository-tapwater enema x1.  Change MiraLAX to 3 times daily dosing, add Senokot nightly scheduled.  PAF with RVR: Rate controlled-continue metoprolol 50  mg twice daily, change Cardizem to daily dosing.  Anticoagulated with Pradaxa  AKI on CKD stage IIIa: AKI improved-creatinine back to baseline-follow periodically  DM-2: Hypoglycemic episode last night-CBGs are also on the lower side this morning-decrease Lantus to 35 units, decrease premeal NovoLog to 4 units-continue SSI.  Follow and adjust.    CBG (last 3)  Recent Labs    06/15/19 0200 06/15/19 0852 06/15/19 1228  GLUCAP 143* 86 148*   HTN: Controlled-continue metoprolol, Cardizem and Lasix.  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  Consults  :  None  Procedures  :  None  ABG: No results found for: PHART, PCO2ART, PO2ART, HCO3, TCO2, ACIDBASEDEF, O2SAT  Vent Settings: N/A  Condition - Stable  Family Communication  :  Spouse updated over the phone on 11/26-we will update tomorrow.  Code Status :  Full Code  Diet :  Diet Order            Diet heart healthy/carb modified Room service appropriate? Yes; Fluid consistency: Thin  Diet effective now               Disposition Plan  :  Remain hospitalized-SNF on discharge when bed available  Barriers to discharge: Hypoxia requiring O2 supplementation  Antimicorbials  :     Anti-infectives (From admission, onward)   Start     Dose/Rate Route Frequency Ordered Stop   06/03/19 1000  remdesivir 100 mg in sodium chloride 0.9 % 250 mL IVPB     100 mg 500 mL/hr over 30 Minutes Intravenous Every 24 hours 06/02/19 0511 06/05/19 1129   06/02/19 1600  remdesivir 100 mg in sodium chloride 0.9 % 250 mL IVPB     100 mg 500 mL/hr over 30 Minutes Intravenous Every 24 hours 06/02/19 0511 06/02/19 1657      Inpatient Medications  Scheduled Meds:  albuterol  2 puff Inhalation Q6H   allopurinol  100 mg Oral Daily   atorvastatin  20 mg Oral q1800   dabigatran  150 mg Oral BID   diltiazem  60 mg Oral Q24H   fluticasone furoate-vilanterol  1 puff Inhalation Daily   furosemide  20 mg Oral Daily   insulin aspart  0-20 Units Subcutaneous TID WC   insulin aspart  0-5 Units Subcutaneous QHS   insulin aspart  4 Units Subcutaneous TID WC   insulin glargine  35 Units Subcutaneous BID   metoprolol tartrate  50 mg Oral BID   pantoprazole  40 mg Oral BID AC   polyethylene glycol  34 g Oral TID   [START ON 06/16/2019] senna  2 tablet Oral QHS   tamsulosin  0.4 mg Oral QHS   umeclidinium bromide  1 puff Inhalation Daily   Continuous Infusions:  sodium chloride Stopped (06/03/19 0934)   PRN Meds:.sodium chloride, acetaminophen, alum & mag hydroxide-simeth, bisacodyl, chlorpheniramine-HYDROcodone, diphenhydrAMINE, guaiFENesin-dextromethorphan, metoprolol tartrate, ondansetron **OR** ondansetron (ZOFRAN) IV, promethazine, sodium chloride, traMADol   Time Spent in minutes  25  See all Orders from today for further details   Oren Binet M.D on 06/15/2019 at 2:21 PM  To page go to [email or web address] - use universal password  [facility] -  Office  [phone]    Objective:   Vitals:   06/14/19 1653 06/14/19 2000 06/15/19 0356 06/15/19 0901  BP: 100/61 (!) 130/57 112/73 (!) 125/55  Pulse: 90 67 85 80  Resp: _0 Temp: 97.7 F (36.5 C)  97.8 F (36.6 C) 97.9 F (36.6 C) 98 F (36.7 C)  TempSrc: Axillary Oral Oral Oral  SpO2: 90% 90% 90% 94%  Weight:      Height:        Wt Readings from Last 3 Encounters:  06/02/19 85.8 kg  06/01/19 84.5 kg  12/29/17 87.3 kg     Intake/Output Summary (Last 24 hours) at 06/15/2019 1421 Last data filed at 06/15/2019 1300 Gross per 24 hour  Intake 360 ml  Output --  Net 360 ml     Physical Exam Gen Exam:Alert awake-not in any distress HEENT:atraumatic, normocephalic Chest: B/L clear to auscultation anteriorly CVS:S1S2 regular Abdomen:soft non tender, non distended Extremities:no edema Neurology: Non focal Skin: no rash   Data Review:    CBC Recent Labs  Lab 06/15/19 1004  WBC 11.8*  HGB 14.9  HCT 43.8  PLT 188  MCV 95.2  MCH 32.4  MCHC 34.0  RDW 14.6    Chemistries  Recent Labs  Lab 06/14/19 0220 06/15/19 1004  NA 133* 132*  K 4.4 4.4  CL 97* 96*  CO2 28 29  GLUCOSE 126* 150*  BUN 53* 42*  CREATININE 1.59* 1.68*  CALCIUM 8.3* 7.8*  AST  --  32  ALT  --  59*  ALKPHOS  --  33*  BILITOT  --  1.7*   ------------------------------------------------------------------------------------------------------------------ No results for input(s): CHOL, HDL, LDLCALC, TRIG, CHOLHDL, LDLDIRECT in the last 72 hours.  Lab Results  Component Value Date   HGBA1C 7.5 (H) 05/31/2019   ------------------------------------------------------------------------------------------------------------------ No results for input(s): TSH, T4TOTAL, T3FREE, THYROIDAB in the last 72 hours.  Invalid input(s): FREET3 ------------------------------------------------------------------------------------------------------------------ No results for input(s): VITAMINB12, FOLATE, FERRITIN, TIBC, IRON, RETICCTPCT in the last 72 hours.  Coagulation profile No results for input(s): INR, PROTIME in the last 168 hours.  No results for input(s): DDIMER in the last 72  hours.  Cardiac Enzymes No results for input(s): CKMB, TROPONINI, MYOGLOBIN in the last 168 hours.  Invalid input(s): CK ------------------------------------------------------------------------------------------------------------------    Component Value Date/Time   BNP 37.0 05/31/2019 0616    Micro Results No results found for this or any previous visit (from the past 240 hour(s)).  Radiology Reports Ct Abdomen Pelvis Wo Contrast  Result Date: 05/31/2019 CLINICAL DATA:  Weight loss, unintended, non-localized Abdominal pain, nausea vomiting and diarrhea. Shortness of breath. Fevers and chills over the last 2 days. Hypoxia EXAM: CT ABDOMEN AND PELVIS WITHOUT CONTRAST TECHNIQUE: Multidetector CT imaging of the abdomen and pelvis was performed following the standard protocol without IV contrast. COMPARISON:  CT of the abdomen and pelvis 11/21/2017 FINDINGS: Lower chest: Scarring of the lingula and right middle lobe is stable. Minimal dependent atelectasis is present. Heart size is normal. Hepatobiliary: There is diffuse fatty infiltration of the liver. No discrete lesions are present. The common bile duct and gallbladder are normal. Pancreas: Unremarkable. No pancreatic ductal dilatation or surrounding inflammatory changes. Spleen: Normal in size without focal abnormality. Adrenals/Urinary Tract: Adrenal glands are normal bilaterally. Kidneys and ureters are within normal limits. There is no stone or mass lesion. No obstruction is present. The urinary bladder is distended. No focal abnormality is present. Stomach/Bowel: The stomach and duodenum are within normal limits. Small bowel is unremarkable. Terminal ileum is within normal limits. Appendix is visualized and normal. The ascending and transverse colon are normal.  The descending and sigmoid colon are within normal limits. Vascular/Lymphatic: Atherosclerotic calcifications are present in the aorta and branch vessels 50% narrowing is present at the  origin of the SMA. Moderate stenosis is present at the origin of the IMA. Dense atherosclerotic calcifications are present in the proximal iliac arteries bilaterally. Reproductive: Prostate is unremarkable. Other: No abdominal wall hernia or abnormality. No abdominopelvic ascites. Musculoskeletal: Advanced degenerative changes are present in the lower lumbar spine. No acute abnormalities are present. Bony pelvis is within normal limits. The hips are located and within normal limits. IMPRESSION: 1. No acute or focal lesion to explain the patient's symptoms. 2. Hepatic steatosis. 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 4. Aortic Atherosclerosis (ICD10-I70.0). Electronically Signed   By: San Morelle M.D.   On: 05/31/2019 07:11   Dg Abd 1 View  Result Date: 06/08/2019 CLINICAL DATA:  Constipation EXAM: ABDOMEN - 1 VIEW COMPARISON:  CT abdomen pelvis-05/31/2019; chest radiograph-earlier same day FINDINGS: Moderate gaseous distention of the colon without upstream distension of the small bowel. Unremarkable colonic stool burden. Nondiagnostic evaluation for pneumoperitoneum secondary to supine positioning and exclusion of the lower thorax. No pneumatosis or portal venous gas. No definitive abnormal intra-abdominal calcifications. Degenerative change the lower lumbar spine and bilateral hips is suspected though incompletely evaluated. IMPRESSION: 1. Mild gaseous distention of the colon without evidence of enteric obstruction. 2. Unremarkable colonic stool burden. Electronically Signed   By: Sandi Mariscal M.D.   On: 06/08/2019 08:33   Nm Pulmonary Perf And Vent  Result Date: 06/01/2019 CLINICAL DATA:  Hypoxemia, shortness of breath since Tuesday, COPD, asthma, question pulmonary embolism EXAM: NUCLEAR MEDICINE VENTILATION - PERFUSION LUNG SCAN TECHNIQUE: Ventilation images were obtained in multiple projections using inhaled aerosol Tc-22mDTPA. Perfusion  images were obtained in multiple projections after intravenous injection of Tc-948mAA. RADIOPHARMACEUTICALS:  31.98 mCi of Tc-9957mPA aerosol inhalation and 4.12 mCi Tc99m3m IV COMPARISON:  None Correlation: Chest radiograph 05/31/2019 FINDINGS: Ventilation: Overall did irregular diminished ventilation within the upper lobes. Focal ventilatory defect lateral aspect RIGHT upper lobe. Prominent LEFT hilum. Perfusion: Matching perfusion defect lateral RIGHT upper lobe. Remaining perfusion normal. No additional segmental or subsegmental perfusion defects. Chest radiograph: Scattered interstitial prominence in the periphery of the lungs bilaterally greater in RIGHT upper lobe at site of ventilation and perfusion abnormality. IMPRESSION: Low probability for pulmonary embolism. Electronically Signed   By: MarkLavonia Dana.   On: 06/01/2019 08:21   Dg Chest Portable 1 View  Result Date: 05/31/2019 CLINICAL DATA:  Shortness of breath EXAM: PORTABLE CHEST 1 VIEW COMPARISON:  January 05, 2016 FINDINGS: The heart size and mediastinal contours are within normal limits. Aortic knob calcifications are seen. There is minimally increased interstitial markings seen at the periphery. There is bibasilar subsegmental atelectasis. No large airspace consolidation or pleural effusion. No acute osseous abnormality. IMPRESSION: Mildly increased interstitial markings which could be due to interstitial edema. Electronically Signed   By: BindPrudencio Pair.   On: 05/31/2019 03:52   Dg Abd Portable 2v  Result Date: 06/15/2019 CLINICAL DATA:  Vomiting. EXAM: PORTABLE ABDOMEN - 2 VIEW COMPARISON:  Abdominal radiograph dated 06/08/2019 and chest x-ray dated 05/31/2019 FINDINGS: The bowel gas pattern is normal. No dilated loops of large or small bowel. No free air in the abdomen. No acute bone abnormality. The patient appears to have hazy peripheral infiltrates at the lung bases which appear to be new since 05/31/2019.  IMPRESSION: 1.  Benign-appearing abdomen. 2. There appear to be new peripheral infiltrates at the left and right lung bases. Electronically Signed   By: Lorriane Shire M.D.   On: 06/15/2019 09:31

## 2019-06-15 NOTE — Plan of Care (Signed)
Discussed with patient plan of care for the evening, pain management and bowel movements with some teach back displayed.  Patient updated and face timed with his daughter which was updated by the RN in the room.

## 2019-06-15 NOTE — Progress Notes (Addendum)
Per RN-patient developed acute urinary retention-with more than 1 L of urine in bladder ultrasound-Foley catheter placed.  Already on Flomax-but will increase to 0.8 mg and-add finasteride.  Will attempt a voiding trial in the next few days.

## 2019-06-16 LAB — BASIC METABOLIC PANEL
Anion gap: 10 (ref 5–15)
BUN: 33 mg/dL — ABNORMAL HIGH (ref 8–23)
CO2: 28 mmol/L (ref 22–32)
Calcium: 7.9 mg/dL — ABNORMAL LOW (ref 8.9–10.3)
Chloride: 95 mmol/L — ABNORMAL LOW (ref 98–111)
Creatinine, Ser: 1.57 mg/dL — ABNORMAL HIGH (ref 0.61–1.24)
GFR calc Af Amer: 49 mL/min — ABNORMAL LOW (ref >60)
GFR calc non Af Amer: 42 mL/min — ABNORMAL LOW (ref >60)
Glucose, Bld: 107 mg/dL — ABNORMAL HIGH (ref 70–99)
Potassium: 4.4 mmol/L (ref 3.5–5.1)
Sodium: 133 mmol/L — ABNORMAL LOW (ref 135–145)

## 2019-06-16 LAB — GLUCOSE, CAPILLARY
Glucose-Capillary: 65 mg/dL — ABNORMAL LOW (ref 70–99)
Glucose-Capillary: 79 mg/dL (ref 70–99)
Glucose-Capillary: 161 mg/dL — ABNORMAL HIGH (ref 70–99)
Glucose-Capillary: 128 mg/dL — ABNORMAL HIGH (ref 70–99)

## 2019-06-16 MED ORDER — FUROSEMIDE 20 MG PO TABS
20.0000 mg | ORAL_TABLET | Freq: Every day | ORAL | Status: DC
  Administered 2019-06-17: 20 mg via ORAL
  Filled 2019-06-16 (×2): qty 1

## 2019-06-16 MED ORDER — HYDROCORTISONE (PERIANAL) 2.5 % EX CREA
TOPICAL_CREAM | Freq: Two times a day (BID) | CUTANEOUS | Status: AC
  Administered 2019-06-16 – 2019-06-17 (×3): via RECTAL
  Filled 2019-06-16: qty 28.35

## 2019-06-16 MED ORDER — INSULIN GLARGINE 100 UNIT/ML ~~LOC~~ SOLN
25.0000 [IU] | Freq: Two times a day (BID) | SUBCUTANEOUS | Status: DC
  Administered 2019-06-16: 25 [IU] via SUBCUTANEOUS
  Filled 2019-06-16 (×3): qty 0.25

## 2019-06-16 MED ORDER — LACTULOSE 10 GM/15ML PO SOLN
30.0000 g | Freq: Two times a day (BID) | ORAL | Status: DC | PRN
  Administered 2019-06-16 – 2019-06-19 (×3): 30 g via ORAL
  Filled 2019-06-16 (×3): qty 45

## 2019-06-16 MED ORDER — SORBITOL 70 % SOLN
30.0000 mL | Freq: Every day | Status: DC | PRN
  Filled 2019-06-16: qty 30

## 2019-06-16 NOTE — Progress Notes (Signed)
Pt. Finally resting. Facetime with family earlier. Had 2 bowel movements after lactulose was given. Sates some relieve, but still has pain. Tramadol given. Pt. Has not eaten much today. Morning lantus was held. Will continuo to monitor.

## 2019-06-16 NOTE — Progress Notes (Signed)
PROGRESS NOTE                                                                                                                                                                                                             Patient Demographics:    Jay Morris, is a 73 y.o. male, DOB - Sep 04, 1945, AST:[id number]  Outpatient Primary MD for the patient is Jay Crouch, MD   Admit date - 06/02/2019   LOS - 14  No chief complaint on file.      Brief Narrative: Patient is a 73 y.o. male with PMHx of COPD, bronchial asthma, PAF on Pradaxa, DM-2, HTN-who presented with fever, chills and worsening shortness of breath-he was found to have acute hypoxic respiratory failure secondary to COVID-19 pneumonia.  Hospital course was complicated by progressively worsening hypoxemia requiring 15 L of high flow oxygen and A. fib with RVR.  Patient was treated with steroids, remdesivir, convalescent plasma with slow clinical improvement.   Subjective:    Jay Morris had BM x2 following 2 enemas yesterday.  Abdominal pain has improved.  He is down to 3 L.  He is lying comfortably in bed.   Assessment  & Plan :   Acute Hypoxic Resp Failure due to Covid 19 Viral pneumonia: Slowly continues to improve-he has been titrated down to 3 L.  Has completed a course of steroids and remdesivir.  Continue attempts to titrate off O2.     Fever: afebrile  O2 requirements:  SpO2: 91 % O2 Flow Rate (L/min): 3 L/min   COVID-19 Labs: No results for input(s): DDIMER, FERRITIN, LDH, CRP in the last 72 hours.  Lab Results  Component Value Date   [place] NEGATIVE 05/31/2019     COVID-19 Medications: Steroids: 11/11>> 11/24 Remdesivir: 11/13>> 11/17 Actemra: Not given Convalescent Plasma: 11/15 x 1  Other medications: Diuretics:Euvolemic-on oral daily Lasix Antibiotics:Not needed as no evidence of bacterial infection  Prone/Incentive  Spirometry: encouraged  incentive spirometry use 3-4/hour.  DVT Prophylaxis  : Pradaxa  Abdominal pain: Suspect from constipation-improved this morning following enema x  2 yesterday-continue MiraLAX 3 times daily dosing, have added Senokot nightly.  Add lactulose as needed for constipation.  Follow.  Acute urinary retention: Developed on 11/27-with discomfort and inability to urinate-bladder scan was positive for more than 1000 mL-Foley catheter placed.  Flomax increased  to 0.8 mg daily, added finasteride-we will perform voiding trial probably tomorrow.  PAF with RVR: Rate controlled-with metoprolol and Cardizem.   Anticoagulated with Pradaxa  AKI on CKD stage IIIa: AKI improved-creatinine back to baseline-follow periodically  DM-2: Hypoglycemic episode this morning-decrease Lantus to 25 units twice daily, will stop premeal NovoLog and just use SSI. Marland Kitchen    CBG (last 3)  Recent Labs    06/15/19 1745 06/15/19 2012 06/16/19 0759  GLUCAP 60* 145* 65*   HTN: Controlled-continue metoprolol, Cardizem and Lasix.  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  Degenerative disc disease: Supportive care  Deconditioning/debility: Secondary to acute illness-PT/OT eval completed-recommendations are for SNF.  Consults  :  None  Procedures  :  None  ABG: No results found for: PHART, PCO2ART, PO2ART, HCO3, TCO2, ACIDBASEDEF, O2SAT  Vent Settings: N/A  Condition - Stable  Family Communication  : Left voicemail for spouse on 11/28  Code Status :  Full Code  Diet :  Diet Order            Diet heart healthy/carb modified Room service appropriate? Yes; Fluid consistency: Thin  Diet effective now               Disposition Plan  :  Remain hospitalized-SNF on discharge  Barriers to discharge: Hypoxia  requiring O2 supplementation  Antimicorbials  :    Anti-infectives (From admission, onward)   Start     Dose/Rate Route Frequency Ordered Stop   06/03/19 1000  remdesivir 100 mg in sodium chloride 0.9 % 250 mL IVPB     100 mg 500 mL/hr over 30 Minutes Intravenous Every 24 hours 06/02/19 0511 06/05/19 1129   06/02/19 1600  remdesivir 100 mg in sodium chloride 0.9 % 250 mL IVPB     100 mg 500 mL/hr over 30 Minutes Intravenous Every 24 hours 06/02/19 0511 06/02/19 1657      Inpatient Medications  Scheduled Meds:  albuterol  2 puff Inhalation Q6H   allopurinol  100 mg Oral Daily   atorvastatin  20 mg Oral q1800   dabigatran  150 mg Oral BID   diltiazem  60 mg Oral Q24H   finasteride  5 mg Oral Daily   fluticasone furoate-vilanterol  1 puff Inhalation Daily   [START ON 06/17/2019] furosemide  20 mg Oral Daily   hydrocortisone   Rectal BID   insulin aspart  0-20 Units Subcutaneous TID WC   insulin aspart  0-5 Units Subcutaneous QHS   insulin aspart  4 Units Subcutaneous TID WC   insulin glargine  35 Units Subcutaneous BID   metoprolol tartrate  50 mg Oral BID   pantoprazole  40 mg Oral BID AC   polyethylene glycol  34 g Oral TID   senna  2 tablet Oral QHS   tamsulosin  0.8 mg Oral QHS   umeclidinium bromide  1 puff Inhalation Daily   Continuous Infusions:  sodium chloride Stopped (06/03/19 0934)   PRN Meds:.sodium chloride, acetaminophen, alum & mag hydroxide-simeth, bisacodyl, chlorpheniramine-HYDROcodone, diphenhydrAMINE, guaiFENesin-dextromethorphan, metoprolol tartrate, ondansetron **OR** ondansetron (ZOFRAN) IV, promethazine, sodium chloride, sorbitol, traMADol   Time Spent in minutes  25  See all Orders from today for further details   Jay Morris M.D on 06/16/2019 at 10:29 AM  To page go to [email or web address] - use universal password  [facility] -  Office  [phone]  Objective:   Vitals:   06/15/19 1500 06/15/19 2024 06/16/19  0311 06/16/19 0718  BP: (!) 120/53 (!) 115/51 110/69 (!) 120/47  Pulse: 83 60 83 66  Resp: _0 Temp: 98.4 F (36.9 C) 97.9 F (36.6 C) 97.9 F (36.6 C) 97.8 F (36.6 C)  TempSrc: Oral Oral Oral Oral  SpO2: 92% 93% 91% 91%  Weight:      Height:        Wt Readings from Last 3 Encounters:  06/02/19 85.8 kg  06/01/19 84.5 kg  12/29/17 87.3 kg     Intake/Output Summary (Last 24 hours) at 06/16/2019 1029 Last data filed at 06/16/2019 0300 Gross per 24 hour  Intake 780 ml  Output 2600 ml  Net -1820 ml     Physical Exam Gen Exam:Alert awake-not in any distress HEENT:atraumatic, normocephalic Chest: B/L clear to auscultation anteriorly CVS:S1S2 regular Abdomen:soft non tender, non distended Extremities:no edema Neurology: Non focal Skin: no rash   Data Review:    CBC Recent Labs  Lab 06/15/19 1004  WBC 11.8*  HGB 14.9  HCT 43.8  PLT 188  MCV 95.2  MCH 32.4  MCHC 34.0  RDW 14.6    Chemistries  Recent Labs  Lab 06/14/19 0220 06/15/19 1004 06/16/19 0836  NA 133* 132* 133*  K 4.4 4.4 4.4  CL 97* 96* 95*  CO2 _1 GLUCOSE 126* 150* 107*  BUN 53* 42* 33*  CREATININE 1.59* 1.68* 1.57*  CALCIUM 8.3* 7.8* 7.9*  AST  --  32  --   ALT  --  59*  --   ALKPHOS  --  33*  --   BILITOT  --  1.7*  --    ------------------------------------------------------------------------------------------------------------------ No results for input(s): CHOL, HDL, LDLCALC, TRIG, CHOLHDL, LDLDIRECT in the last 72 hours.  Lab Results  Component Value Date   HGBA1C 7.5 (H) 05/31/2019   ------------------------------------------------------------------------------------------------------------------ No results for input(s): TSH, T4TOTAL, T3FREE, THYROIDAB in the last 72 hours.  Invalid input(s): FREET3 ------------------------------------------------------------------------------------------------------------------ No results for input(s): VITAMINB12,  FOLATE, FERRITIN, TIBC, IRON, RETICCTPCT in the last 72 hours.  Coagulation profile No results for input(s): INR, PROTIME in the last 168 hours.  No results for input(s): DDIMER in the last 72 hours.  Cardiac Enzymes No results for input(s): CKMB, TROPONINI, MYOGLOBIN in the last 168 hours.  Invalid input(s): CK ------------------------------------------------------------------------------------------------------------------    Component Value Date/Time   BNP 37.0 05/31/2019 0616    Micro Results No results found for this or any previous visit (from the past 240 hour(s)).  Radiology Reports Ct Abdomen Pelvis Wo Contrast  Result Date: 05/31/2019 CLINICAL DATA:  Weight loss, unintended, non-localized Abdominal pain, nausea vomiting and diarrhea. Shortness of breath. Fevers and chills over the last 2 days. Hypoxia EXAM: CT ABDOMEN AND PELVIS WITHOUT CONTRAST TECHNIQUE: Multidetector CT imaging of the abdomen and pelvis was performed following the standard protocol without IV contrast. COMPARISON:  CT of the abdomen and pelvis 11/21/2017 FINDINGS: Lower chest: Scarring of the lingula and right middle lobe is stable. Minimal dependent atelectasis is present. Heart size is normal. Hepatobiliary: There is diffuse fatty infiltration of the liver. No discrete lesions are present. The common bile duct and gallbladder are normal. Pancreas: Unremarkable. No pancreatic ductal dilatation or surrounding inflammatory changes. Spleen: Normal in size without focal abnormality. Adrenals/Urinary Tract: Adrenal glands are normal bilaterally. Kidneys and ureters are within normal limits. There is no stone or mass lesion. No obstruction is present. The urinary  bladder is distended. No focal abnormality is present. Stomach/Bowel: The stomach and duodenum are within normal limits. Small bowel is unremarkable. Terminal ileum is within normal limits. Appendix is visualized and normal. The ascending and transverse colon  are normal. The descending and sigmoid colon are within normal limits. Vascular/Lymphatic: Atherosclerotic calcifications are present in the aorta and branch vessels 50% narrowing is present at the origin of the SMA. Moderate stenosis is present at the origin of the IMA. Dense atherosclerotic calcifications are present in the proximal iliac arteries bilaterally. Reproductive: Prostate is unremarkable. Other: No abdominal wall hernia or abnormality. No abdominopelvic ascites. Musculoskeletal: Advanced degenerative changes are present in the lower lumbar spine. No acute abnormalities are present. Bony pelvis is within normal limits. The hips are located and within normal limits. IMPRESSION: 1. No acute or focal lesion to explain the patient's symptoms. 2. Hepatic steatosis. 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 4. Aortic Atherosclerosis (ICD10-I70.0). Electronically Signed   By: San Morelle M.D.   On: 05/31/2019 07:11   Dg Abd 1 View  Result Date: 06/08/2019 CLINICAL DATA:  Constipation EXAM: ABDOMEN - 1 VIEW COMPARISON:  CT abdomen pelvis-05/31/2019; chest radiograph-earlier same day FINDINGS: Moderate gaseous distention of the colon without upstream distension of the small bowel. Unremarkable colonic stool burden. Nondiagnostic evaluation for pneumoperitoneum secondary to supine positioning and exclusion of the lower thorax. No pneumatosis or portal venous gas. No definitive abnormal intra-abdominal calcifications. Degenerative change the lower lumbar spine and bilateral hips is suspected though incompletely evaluated. IMPRESSION: 1. Mild gaseous distention of the colon without evidence of enteric obstruction. 2. Unremarkable colonic stool burden. Electronically Signed   By: Sandi Mariscal M.D.   On: 06/08/2019 08:33   Nm Pulmonary Perf And Vent  Result Date: 06/01/2019 CLINICAL DATA:  Hypoxemia, shortness of breath since Tuesday, COPD, asthma,  question pulmonary embolism EXAM: NUCLEAR MEDICINE VENTILATION - PERFUSION LUNG SCAN TECHNIQUE: Ventilation images were obtained in multiple projections using inhaled aerosol Tc-48mDTPA. Perfusion images were obtained in multiple projections after intravenous injection of Tc-934mAA. RADIOPHARMACEUTICALS:  31.98 mCi of Tc-9957mPA aerosol inhalation and 4.12 mCi Tc99m42m IV COMPARISON:  None Correlation: Chest radiograph 05/31/2019 FINDINGS: Ventilation: Overall did irregular diminished ventilation within the upper lobes. Focal ventilatory defect lateral aspect RIGHT upper lobe. Prominent LEFT hilum. Perfusion: Matching perfusion defect lateral RIGHT upper lobe. Remaining perfusion normal. No additional segmental or subsegmental perfusion defects. Chest radiograph: Scattered interstitial prominence in the periphery of the lungs bilaterally greater in RIGHT upper lobe at site of ventilation and perfusion abnormality. IMPRESSION: Low probability for pulmonary embolism. Electronically Signed   By: MarkLavonia Dana.   On: 06/01/2019 08:21   Dg Chest Portable 1 View  Result Date: 05/31/2019 CLINICAL DATA:  Shortness of breath EXAM: PORTABLE CHEST 1 VIEW COMPARISON:  January 05, 2016 FINDINGS: The heart size and mediastinal contours are within normal limits. Aortic knob calcifications are seen. There is minimally increased interstitial markings seen at the periphery. There is bibasilar subsegmental atelectasis. No large airspace consolidation or pleural effusion. No acute osseous abnormality. IMPRESSION: Mildly increased interstitial markings which could be due to interstitial edema. Electronically Signed   By: BindPrudencio Pair.   On: 05/31/2019 03:52   Dg Abd Portable 2v  Result Date: 06/15/2019 CLINICAL DATA:  Vomiting. EXAM: PORTABLE ABDOMEN - 2 VIEW COMPARISON:  Abdominal radiograph dated 06/08/2019 and chest x-ray dated 05/31/2019 FINDINGS: The bowel gas pattern  is normal. No dilated loops of large or small  bowel. No free air in the abdomen. No acute bone abnormality. The patient appears to have hazy peripheral infiltrates at the lung bases which appear to be new since 05/31/2019. IMPRESSION: 1. Benign-appearing abdomen. 2. There appear to be new peripheral infiltrates at the left and right lung bases. Electronically Signed   By: Lorriane Shire M.D.   On: 06/15/2019 09:31

## 2019-06-16 NOTE — Progress Notes (Signed)
Wife called per Rn with daily update No further concerns

## 2019-06-16 NOTE — Plan of Care (Signed)
Discussed with patient plan of care for the evening, pain management and medications to help with his abdominal distention/bowels with some teach back displayed

## 2019-06-16 NOTE — Progress Notes (Signed)
Physical Therapy Treatment Patient Details Name: Calistro Rauf MRN: [id number] DOB: [birth year] Today's Date: 06/16/2019    History of Present Illness From MD H&P: Pt is a 73 y.o. male with a known history of asthma, atrial fibrillation, COPD, type 2 diabetes mellitus, hypertension and dyslipidemia, presented to the emergency room with acute onset of 2-day history of fever with a T-max of 103 and chills with associated body aching, generalized weakness, fatigue and malaise.  He admitted to dyspnea with paroxysmal nocturnal dyspnea and dyspnea on exertion without orthopnea or lower extremity edema.  He admitted to nausea and vomiting as well as watery diarrhea.  He admitted to mild epigastric and infraumbilical abdominal pain.  He lost about 10 pounds during the last 4 days.  He had mild dizziness without headache or diplopia.  Upon presentation to the emergency room, temperature was 99.3 respiratory to 25, heart rate 82, blood pressure 150/66 and pulse oximetry 91% on room air dropped to 82% upon ambulation for short distance.  CBC showed anemia with hemoglobin of 12.5 hematocrit of 37.1 better than his previous levels and very close to his baseline.  For which x-ray showed mild increase interstitial markings which could be due to interstitial pulmonary edema.  He will be admitted to a medical monitored bed for further evaluation and management.  MD assessment includes: Acute kidney injury superimposed on chronic kidney disease stage IIIa, N&V, diarrhea, DM II with hyperglycemia, A-fib, and general weakness.    PT Comments    Pt c/o feeling weak, had an episode of incontinence at therapist arrival. Pt is needing increased assistance with functional mobility today. He was able to complete bed mob and transfers with min-mod a, ambulated approx 57f in room with min a and HHA. He needed increased encouragement for mobility this pm and to sit in recliner. Once in recliner seems to be more alert and even  attempts to eat some food. Will continue to encourage daily mobility and sitting in recliner.Pt on 3L/min via HFNC this pm.     Follow Up Recommendations  SNF     Equipment Recommendations  Rolling walker with 5" wheels;Other (comment)    Recommendations for Other Services       Precautions / Restrictions Precautions Precautions: Fall Restrictions Weight Bearing Restrictions: No    Mobility  Bed Mobility Overal bed mobility: Needs Assistance Bed Mobility: Supine to Sit     Supine to sit: Min guard        Transfers Overall transfer level: Needs assistance Equipment used: 1 person hand held assist Transfers: Sit to/from Stand Sit to Stand: Mod assist            Ambulation/Gait Ambulation/Gait assistance: Min assist Gait Distance (Feet): 40 Feet Assistive device: 1 person hand held assist Gait Pattern/deviations: Step-through pattern Gait velocity: decreased   General Gait Details: 3L/min via HFNC   Stairs             Wheelchair Mobility    Modified Rankin (Stroke Patients Only)       Balance Overall balance assessment: Mild deficits observed, not formally tested                                          Cognition Arousal/Alertness: Lethargic Behavior During Therapy: WFL for tasks assessed/performed Overall Cognitive Status: Within Functional Limits for tasks assessed  Exercises      General Comments        Pertinent Vitals/Pain Pain Assessment: Faces Faces Pain Scale: Hurts little more Pain Location: w/ movement Pain Intervention(s): Limited activity within patient's tolerance    Home Living                      Prior Function            PT Goals (current goals can now be found in the care plan section) Acute Rehab PT Goals Patient Stated Goal: states is feeling weak PT Goal Formulation: With patient Time For Goal Achievement:  06/22/19 Potential to Achieve Goals: Fair Progress towards PT goals: Progressing toward goals    Frequency    Min 2X/week      PT Plan Current plan remains appropriate    Co-evaluation              AM-PAC PT "6 Clicks" Mobility   Outcome Measure  Help needed turning from your back to your side while in a flat bed without using bedrails?: None Help needed moving from lying on your back to sitting on the side of a flat bed without using bedrails?: A Little Help needed moving to and from a bed to a chair (including a wheelchair)?: A Little Help needed standing up from a chair using your arms (e.g., wheelchair or bedside chair)?: A Lot Help needed to walk in hospital room?: A Lot Help needed climbing 3-5 steps with a railing? : A Lot 6 Click Score: 16    End of Session Equipment Utilized During Treatment: Oxygen Activity Tolerance: Patient limited by fatigue;Patient limited by lethargy;Treatment limited secondary to medical complications (Comment) Patient left: in chair;with call bell/phone within reach;with nursing/sitter in room Nurse Communication: Mobility status PT Visit Diagnosis: Difficulty in walking, not elsewhere classified (R26.2);Muscle weakness (generalized) (M62.81)     Time: 1350-1406 PT Time Calculation (min) (ACUTE ONLY): 16 min  Charges:  $Therapeutic Activity: 8-22 mins                     Horald Chestnut, PT    Delford Field 06/16/2019, 2:21 PM

## 2019-06-17 ENCOUNTER — Inpatient Hospital Stay (HOSPITAL_COMMUNITY): Payer: Medicare HMO

## 2019-06-17 LAB — GLUCOSE, CAPILLARY
Glucose-Capillary: 97 mg/dL (ref 70–99)
Glucose-Capillary: 104 mg/dL — ABNORMAL HIGH (ref 70–99)
Glucose-Capillary: 204 mg/dL — ABNORMAL HIGH (ref 70–99)

## 2019-06-17 MED ORDER — INSULIN GLARGINE 100 UNIT/ML ~~LOC~~ SOLN
20.0000 [IU] | Freq: Two times a day (BID) | SUBCUTANEOUS | Status: DC
  Administered 2019-06-17: 22:00:00 20 [IU] via SUBCUTANEOUS
  Filled 2019-06-17 (×2): qty 0.2

## 2019-06-17 MED ORDER — SENNA 8.6 MG PO TABS
2.0000 | ORAL_TABLET | Freq: Every day | ORAL | Status: DC
  Administered 2019-06-18 – 2019-06-24 (×5): 17.2 mg via ORAL
  Filled 2019-06-17 (×7): qty 2

## 2019-06-17 MED ORDER — SUCRALFATE 1 GM/10ML PO SUSP
1.0000 g | Freq: Three times a day (TID) | ORAL | Status: DC
  Administered 2019-06-17 – 2019-06-25 (×31): 1 g via ORAL
  Filled 2019-06-17 (×34): qty 10

## 2019-06-17 MED ORDER — POLYETHYLENE GLYCOL 3350 17 G PO PACK
17.0000 g | PACK | Freq: Two times a day (BID) | ORAL | Status: DC
  Administered 2019-06-18 – 2019-06-19 (×3): 17 g via ORAL
  Filled 2019-06-17 (×3): qty 1

## 2019-06-17 NOTE — Progress Notes (Signed)
PROGRESS NOTE                                                                                                                                                                                                             Patient Demographics:    Jay Morris, is a 73 y.o. male, DOB - 1946/02/24, CXF:[id number]  Outpatient Primary MD for the patient is [name], MD   Admit date - 06/02/2019   LOS - 15  No chief complaint on file.      Brief Narrative: Patient is a 73 y.o. male with PMHx of COPD, bronchial asthma, PAF on Pradaxa, DM-2, HTN-who presented with fever, chills and worsening shortness of breath-he was found to have acute hypoxic respiratory failure secondary to COVID-19 pneumonia.  Hospital course was complicated by progressively worsening hypoxemia requiring 15 L of high flow oxygen and A. fib with RVR.  Patient was treated with steroids, remdesivir, convalescent plasma with slow clinical improvement.   Subjective:    Jay Morris has had multiple BMs today.  He still continues to complain of mild abdominal pain.  Per nursing staff-does not have an appetite.  Abdomen is  nondistended.  No vomiting.   Assessment  & Plan :   Acute Hypoxic Resp Failure due to Covid 19 Viral pneumonia: Hypoxia continues to improve-he is now on just 2 L of oxygen.  Continued attempts to titrate off O2.  Has completed a course of both steroids and remdesivir.   Fever: afebrile  O2 requirements:  SpO2: 94 % O2 Flow Rate (L/min): 2 L/min   COVID-19 Labs: No results for input(s): DDIMER, FERRITIN, LDH, CRP in the last 72 hours.  Lab Results  Component Value Date   [place] NEGATIVE 05/31/2019     COVID-19 Medications: Steroids: 11/11>> 11/24 Remdesivir: 11/13>> 11/17 Actemra: Not given Convalescent Plasma: 11/15 x 1  Other medications: Diuretics:Euvolemic-on oral daily Lasix Antibiotics:Not needed as no  evidence of bacterial infection  Prone/Incentive Spirometry: encouraged  incentive spirometry use 3-4/hour.  DVT Prophylaxis  : Pradaxa  Abdominal pain: Initially thought this was from constipation-however continues to have mild abdominal discomfort in spite of having multiple BMs following enema/MiraLAX/senna.  Repeat CT scan of the abdomen on 11/21 does not show any acute abnormalities.  Abdominal exam is benign-suspect that this may be dyspepsia-continue PPI twice daily dosing-add  Carafate and see how he does.  Acute urinary retention: Developed on 11/27-with discomfort and inability to urinate-bladder scan showed more than 1000 mL-Foley catheter placed.  Continue Flomax at 0.8 mg daily dose-continue finasteride-voiding trial today.    PAF with RVR: Rate controlled-with metoprolol and Cardizem.   Anticoagulated with Pradaxa  AKI on CKD stage IIIa: AKI improved-creatinine back to baseline-follow periodically  DM-2: CBG stable-decrease Lantus to 20 units twice daily, continue SSI.  Follow and adjust.  CBG (last 3)  Recent Labs    06/16/19 1943 06/17/19 0643 06/17/19 0809  GLUCAP 128* 97 104*   HTN: Controlled-continue metoprolol, Cardizem and Lasix.  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  Degenerative disc disease: Supportive care  Deconditioning/debility: Secondary to acute illness-PT/OT eval completed-recommendations are for SNF.  Consults  :  None  Procedures  :  None  ABG: No results found for: PHART, PCO2ART, PO2ART, HCO3, TCO2, ACIDBASEDEF, O2SAT  Vent Settings: N/A  Condition - Stable  Family Communication  : Left voicemail for spouse and daughter on 11/29  Code Status :  Full Code  Diet :  Diet Order            Diet heart healthy/carb modified Room service appropriate?  Yes; Fluid consistency: Thin  Diet effective now               Disposition Plan  :  Remain hospitalized-SNF on discharge  Barriers to discharge: Hypoxia requiring O2 supplementation  Antimicorbials  :    Anti-infectives (From admission, onward)   Start     Dose/Rate Route Frequency Ordered Stop   06/03/19 1000  remdesivir 100 mg in sodium chloride 0.9 % 250 mL IVPB     100 mg 500 mL/hr over 30 Minutes Intravenous Every 24 hours 06/02/19 0511 06/05/19 1129   06/02/19 1600  remdesivir 100 mg in sodium chloride 0.9 % 250 mL IVPB     100 mg 500 mL/hr over 30 Minutes Intravenous Every 24 hours 06/02/19 0511 06/02/19 1657      Inpatient Medications  Scheduled Meds:  albuterol  2 puff Inhalation Q6H   allopurinol  100 mg Oral Daily   atorvastatin  20 mg Oral q1800   dabigatran  150 mg Oral BID   finasteride  5 mg Oral Daily   fluticasone furoate-vilanterol  1 puff Inhalation Daily   furosemide  20 mg Oral Daily   hydrocortisone   Rectal BID   insulin aspart  0-20 Units Subcutaneous TID WC   insulin aspart  0-5 Units Subcutaneous QHS   insulin glargine  25 Units Subcutaneous BID   metoprolol tartrate  50 mg Oral BID   pantoprazole  40 mg Oral BID AC   [START ON 06/18/2019] polyethylene glycol  17 g Oral BID   [START ON 06/18/2019] senna  2 tablet Oral QHS   sucralfate  1 g Oral TID WC & HS   tamsulosin  0.8 mg Oral QHS   umeclidinium bromide  1 puff Inhalation Daily   Continuous Infusions:  sodium chloride Stopped (06/03/19 0934)   PRN Meds:.sodium chloride, acetaminophen, alum & mag hydroxide-simeth, bisacodyl, chlorpheniramine-HYDROcodone, diphenhydrAMINE, guaiFENesin-dextromethorphan, lactulose, metoprolol tartrate, ondansetron **OR** ondansetron (ZOFRAN) IV, promethazine, sodium chloride, traMADol   Time Spent in minutes  25  See all Orders from today for further details   Oren Binet M.D on 06/17/2019 at 12:12 PM  To page go to  [email or web address] - use universal password  [facility] -  Office  [phone]    Objective:   Vitals:   06/16/19 1534 06/16/19 1948 06/17/19 0506 06/17/19 0758  BP: (!) 109/56 108/61 (!) 98/59 111/64  Pulse: 73 (!) 55 81 84  Resp: _0 Temp: 98 F (36.7 C) 97.7 F (36.5 C) 98 F (36.7 C) 98 F (36.7 C)  TempSrc: Axillary Oral Oral Oral  SpO2: 91% 92% 94% 94%  Weight:      Height:        Wt Readings from Last 3 Encounters:  06/02/19 85.8 kg  06/01/19 84.5 kg  12/29/17 87.3 kg     Intake/Output Summary (Last 24 hours) at 06/17/2019 1212 Last data filed at 06/17/2019 0600 Gross per 24 hour  Intake 510 ml  Output 1000 ml  Net -490 ml     Physical Exam Gen Exam:Alert awake-not in any distress HEENT:atraumatic, normocephalic Chest: B/L clear to auscultation anteriorly CVS:S1S2 regular Abdomen:soft non tender, non distended Extremities:no edema Neurology: Non focal Skin: no rash   Data Review:    CBC Recent Labs  Lab 06/15/19 1004  WBC 11.8*  HGB 14.9  HCT 43.8  PLT 188  MCV 95.2  MCH 32.4  MCHC 34.0  RDW 14.6    Chemistries  Recent Labs  Lab 06/14/19 0220 06/15/19 1004 06/16/19 0836  NA 133* 132* 133*  K 4.4 4.4 4.4  CL 97* 96* 95*  CO2 _1 GLUCOSE 126* 150* 107*  BUN 53* 42* 33*  CREATININE 1.59* 1.68* 1.57*  CALCIUM 8.3* 7.8* 7.9*  AST  --  32  --   ALT  --  59*  --   ALKPHOS  --  33*  --   BILITOT  --  1.7*  --    ------------------------------------------------------------------------------------------------------------------ No results for input(s): CHOL, HDL, LDLCALC, TRIG, CHOLHDL, LDLDIRECT in the last 72 hours.  Lab Results  Component Value Date   HGBA1C 7.5 (H) 05/31/2019   ------------------------------------------------------------------------------------------------------------------ No results for input(s): TSH, T4TOTAL, T3FREE, THYROIDAB in the last 72 hours.  Invalid input(s):  FREET3 ------------------------------------------------------------------------------------------------------------------ No results for input(s): VITAMINB12, FOLATE, FERRITIN, TIBC, IRON, RETICCTPCT in the last 72 hours.  Coagulation profile No results for input(s): INR, PROTIME in the last 168 hours.  No results for input(s): DDIMER in the last 72 hours.  Cardiac Enzymes No results for input(s): CKMB, TROPONINI, MYOGLOBIN in the last 168 hours.  Invalid input(s): CK ------------------------------------------------------------------------------------------------------------------    Component Value Date/Time   BNP 37.0 05/31/2019 0616    Micro Results No results found for this or any previous visit (from the past 240 hour(s)).  Radiology Reports Ct Abdomen Pelvis Wo Contrast  Result Date: 06/17/2019 CLINICAL DATA:  Abdominal distension. EXAM: CT ABDOMEN AND PELVIS WITHOUT CONTRAST TECHNIQUE: Multidetector CT imaging of the abdomen and pelvis was performed following the standard protocol without IV contrast. COMPARISON:  Chest x-ray May 31, 2019. CT abdomen and pelvis May 31, 2019. FINDINGS: Lower chest: There are patchy infiltrates in the bases and right middle lobe which are new in the interval, worrisome for an infectious process. Atypical infections should be considered. Lung bases are otherwise normal. Lower chest is otherwise normal. Hepatobiliary: A low-attenuation lesion in the right hepatic lobe on series 2, image 17 is stable and remains too small to characterize. Statistically, this is most likely a small benign lesion such as a cyst. No other liver abnormalities are identified. The gallbladder  is normal. The portal veins not assessed due to lack of contrast. Pancreas: Unremarkable. No pancreatic ductal dilatation or surrounding inflammatory changes. Spleen: Normal in size without focal abnormality. Adrenals/Urinary Tract: Bilateral perinephric stranding is stable and  likely nonacute. The kidneys and ureters are otherwise normal. There is a Foley catheter in the bladder. The bladder is partially decompressed. There is air in the bladder is well, likely due to the Foley catheter. The fluid in the bladder demonstrates an attenuation of 49 Hounsfield units. Stomach/Bowel: Stomach is within normal limits. Appendix appears normal. No evidence of bowel wall thickening, distention, or inflammatory changes. Vascular/Lymphatic: Calcified atherosclerosis is seen in the nonaneurysmal aorta. No adenopathy. Reproductive: Prostate is unremarkable. Other: No abdominal wall hernia or abnormality. No abdominopelvic ascites. Musculoskeletal: No acute or significant osseous findings. IMPRESSION: 1. Patchy infiltrates in the lung bases and right middle lobe are new in the interval, worrisome for an infectious process. Atypical infections should be considered. 2. The bladder is partially decompressed with a Foley catheter. The fluid in the bladder demonstrates an attenuation of 49 Hounsfield units. This could represent blood products. Recommend clinical correlation. 3. Atherosclerosis in the nonaneurysmal aorta. Aortic Atherosclerosis (ICD10-I70.0). Electronically Signed   By: Dorise Bullion III M.D   On: 06/17/2019 11:22   Ct Abdomen Pelvis Wo Contrast  Result Date: 05/31/2019 CLINICAL DATA:  Weight loss, unintended, non-localized Abdominal pain, nausea vomiting and diarrhea. Shortness of breath. Fevers and chills over the last 2 days. Hypoxia EXAM: CT ABDOMEN AND PELVIS WITHOUT CONTRAST TECHNIQUE: Multidetector CT imaging of the abdomen and pelvis was performed following the standard protocol without IV contrast. COMPARISON:  CT of the abdomen and pelvis 11/21/2017 FINDINGS: Lower chest: Scarring of the lingula and right middle lobe is stable. Minimal dependent atelectasis is present. Heart size is normal. Hepatobiliary: There is diffuse fatty infiltration of the liver. No discrete lesions  are present. The common bile duct and gallbladder are normal. Pancreas: Unremarkable. No pancreatic ductal dilatation or surrounding inflammatory changes. Spleen: Normal in size without focal abnormality. Adrenals/Urinary Tract: Adrenal glands are normal bilaterally. Kidneys and ureters are within normal limits. There is no stone or mass lesion. No obstruction is present. The urinary bladder is distended. No focal abnormality is present. Stomach/Bowel: The stomach and duodenum are within normal limits. Small bowel is unremarkable. Terminal ileum is within normal limits. Appendix is visualized and normal. The ascending and transverse colon are normal. The descending and sigmoid colon are within normal limits. Vascular/Lymphatic: Atherosclerotic calcifications are present in the aorta and branch vessels 50% narrowing is present at the origin of the SMA. Moderate stenosis is present at the origin of the IMA. Dense atherosclerotic calcifications are present in the proximal iliac arteries bilaterally. Reproductive: Prostate is unremarkable. Other: No abdominal wall hernia or abnormality. No abdominopelvic ascites. Musculoskeletal: Advanced degenerative changes are present in the lower lumbar spine. No acute abnormalities are present. Bony pelvis is within normal limits. The hips are located and within normal limits. IMPRESSION: 1. No acute or focal lesion to explain the patient's symptoms. 2. Hepatic steatosis. 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 4. Aortic Atherosclerosis (ICD10-I70.0). Electronically Signed   By: San Morelle M.D.   On: 05/31/2019 07:11   Dg Abd 1 View  Result Date: 06/08/2019 CLINICAL DATA:  Constipation EXAM: ABDOMEN - 1 VIEW COMPARISON:  CT abdomen pelvis-05/31/2019; chest radiograph-earlier same day FINDINGS: Moderate gaseous distention of the colon without upstream distension  of the small bowel. Unremarkable colonic stool burden.  Nondiagnostic evaluation for pneumoperitoneum secondary to supine positioning and exclusion of the lower thorax. No pneumatosis or portal venous gas. No definitive abnormal intra-abdominal calcifications. Degenerative change the lower lumbar spine and bilateral hips is suspected though incompletely evaluated. IMPRESSION: 1. Mild gaseous distention of the colon without evidence of enteric obstruction. 2. Unremarkable colonic stool burden. Electronically Signed   By: Sandi Mariscal M.D.   On: 06/08/2019 08:33   Nm Pulmonary Perf And Vent  Result Date: 06/01/2019 CLINICAL DATA:  Hypoxemia, shortness of breath since Tuesday, COPD, asthma, question pulmonary embolism EXAM: NUCLEAR MEDICINE VENTILATION - PERFUSION LUNG SCAN TECHNIQUE: Ventilation images were obtained in multiple projections using inhaled aerosol Tc-60mDTPA. Perfusion images were obtained in multiple projections after intravenous injection of Tc-931mAA. RADIOPHARMACEUTICALS:  31.98 mCi of Tc-9938mPA aerosol inhalation and 4.12 mCi Tc99m13m IV COMPARISON:  None Correlation: Chest radiograph 05/31/2019 FINDINGS: Ventilation: Overall did irregular diminished ventilation within the upper lobes. Focal ventilatory defect lateral aspect RIGHT upper lobe. Prominent LEFT hilum. Perfusion: Matching perfusion defect lateral RIGHT upper lobe. Remaining perfusion normal. No additional segmental or subsegmental perfusion defects. Chest radiograph: Scattered interstitial prominence in the periphery of the lungs bilaterally greater in RIGHT upper lobe at site of ventilation and perfusion abnormality. IMPRESSION: Low probability for pulmonary embolism. Electronically Signed   By: MarkLavonia Dana.   On: 06/01/2019 08:21   Dg Chest Portable 1 View  Result Date: 05/31/2019 CLINICAL DATA:  Shortness of breath EXAM: PORTABLE CHEST 1 VIEW COMPARISON:  January 05, 2016 FINDINGS: The heart size and mediastinal contours are within normal limits. Aortic knob calcifications  are seen. There is minimally increased interstitial markings seen at the periphery. There is bibasilar subsegmental atelectasis. No large airspace consolidation or pleural effusion. No acute osseous abnormality. IMPRESSION: Mildly increased interstitial markings which could be due to interstitial edema. Electronically Signed   By: BindPrudencio Pair.   On: 05/31/2019 03:52   Dg Abd Portable 2v  Result Date: 06/15/2019 CLINICAL DATA:  Vomiting. EXAM: PORTABLE ABDOMEN - 2 VIEW COMPARISON:  Abdominal radiograph dated 06/08/2019 and chest x-ray dated 05/31/2019 FINDINGS: The bowel gas pattern is normal. No dilated loops of large or small bowel. No free air in the abdomen. No acute bone abnormality. The patient appears to have hazy peripheral infiltrates at the lung bases which appear to be new since 05/31/2019. IMPRESSION: 1. Benign-appearing abdomen. 2. There appear to be new peripheral infiltrates at the left and right lung bases. Electronically Signed   By: JameLorriane Shire.   On: 06/15/2019 09:31

## 2019-06-17 NOTE — Progress Notes (Signed)
Family requested another xray of abdomen

## 2019-06-17 NOTE — Progress Notes (Signed)
Pt. Was able to move from the chair to the bedside commode alone without any help.

## 2019-06-17 NOTE — Progress Notes (Signed)
Updated daughter and family wants another xray or ct of abdomen to make sure nothing else is going on with his hypotension, hypoglycemic and distention.  Patient given lantus 25 units last night with snack and recheck cbg 98 this am with snack.

## 2019-06-17 NOTE — Progress Notes (Signed)
Daughter updated per Rn. Pt. Is having lots of bowel movements today. CT was normal per MD. Pt. States feeling better. Will continuo to monitor. Daughter asked if PT could reevaluate him tomorrow to see if he could go home instead of rehab. She stated that as long as he can go from the chair to the bathroom with a walker, he would be better at home under their care. Maybe home health visits and PT at home.

## 2019-06-18 LAB — GLUCOSE, CAPILLARY
Glucose-Capillary: 126 mg/dL — ABNORMAL HIGH (ref 70–99)
Glucose-Capillary: 380 mg/dL — ABNORMAL HIGH (ref 70–99)
Glucose-Capillary: 70 mg/dL (ref 70–99)
Glucose-Capillary: 167 mg/dL — ABNORMAL HIGH (ref 70–99)
Glucose-Capillary: 259 mg/dL — ABNORMAL HIGH (ref 70–99)
Glucose-Capillary: 318 mg/dL — ABNORMAL HIGH (ref 70–99)
Glucose-Capillary: 350 mg/dL — ABNORMAL HIGH (ref 70–99)

## 2019-06-18 LAB — COMPREHENSIVE METABOLIC PANEL
ALT: 31 U/L (ref 0–44)
AST: 26 U/L (ref 15–41)
Albumin: 2.3 g/dL — ABNORMAL LOW (ref 3.5–5.0)
Alkaline Phosphatase: 30 U/L — ABNORMAL LOW (ref 38–126)
Anion gap: 11 (ref 5–15)
BUN: 26 mg/dL — ABNORMAL HIGH (ref 8–23)
CO2: 22 mmol/L (ref 22–32)
Calcium: 7.5 mg/dL — ABNORMAL LOW (ref 8.9–10.3)
Chloride: 98 mmol/L (ref 98–111)
Creatinine, Ser: 1.42 mg/dL — ABNORMAL HIGH (ref 0.61–1.24)
GFR calc Af Amer: 56 mL/min — ABNORMAL LOW (ref >60)
GFR calc non Af Amer: 48 mL/min — ABNORMAL LOW (ref >60)
Glucose, Bld: 304 mg/dL — ABNORMAL HIGH (ref 70–99)
Potassium: 4 mmol/L (ref 3.5–5.1)
Sodium: 131 mmol/L — ABNORMAL LOW (ref 135–145)
Total Bilirubin: 1.3 mg/dL — ABNORMAL HIGH (ref 0.3–1.2)
Total Protein: 5.2 g/dL — ABNORMAL LOW (ref 6.5–8.1)

## 2019-06-18 MED ORDER — FUROSEMIDE 20 MG PO TABS
20.0000 mg | ORAL_TABLET | Freq: Every day | ORAL | Status: DC
  Administered 2019-06-19: 20 mg via ORAL
  Filled 2019-06-18: qty 20

## 2019-06-18 MED ORDER — INSULIN ASPART 100 UNIT/ML ~~LOC~~ SOLN
0.0000 [IU] | Freq: Three times a day (TID) | SUBCUTANEOUS | Status: DC
  Administered 2019-06-18: 11 [IU] via SUBCUTANEOUS
  Administered 2019-06-18: 12:00:00 3 [IU] via SUBCUTANEOUS
  Administered 2019-06-19 – 2019-06-20 (×2): 11 [IU] via SUBCUTANEOUS
  Administered 2019-06-20: 5 [IU] via SUBCUTANEOUS
  Administered 2019-06-20: 2 [IU] via SUBCUTANEOUS
  Administered 2019-06-21 (×2): 3 [IU] via SUBCUTANEOUS
  Administered 2019-06-21: 5 [IU] via SUBCUTANEOUS
  Administered 2019-06-22: 2 [IU] via SUBCUTANEOUS
  Administered 2019-06-22: 5 [IU] via SUBCUTANEOUS
  Administered 2019-06-23: 3 [IU] via SUBCUTANEOUS
  Administered 2019-06-23: 5 [IU] via SUBCUTANEOUS
  Administered 2019-06-24 (×2): 8 [IU] via SUBCUTANEOUS
  Administered 2019-06-25: 3 [IU] via SUBCUTANEOUS
  Administered 2019-06-25: 5 [IU] via SUBCUTANEOUS

## 2019-06-18 MED ORDER — AMIODARONE HCL IN DEXTROSE 360-4.14 MG/200ML-% IV SOLN
30.0000 mg/h | INTRAVENOUS | Status: DC
  Administered 2019-06-18: 30 mg/h via INTRAVENOUS
  Filled 2019-06-18 (×3): qty 200

## 2019-06-18 MED ORDER — AMIODARONE HCL IN DEXTROSE 360-4.14 MG/200ML-% IV SOLN
60.0000 mg/h | INTRAVENOUS | Status: AC
  Administered 2019-06-18: 60 mg/h via INTRAVENOUS
  Filled 2019-06-18: qty 200

## 2019-06-18 MED ORDER — AMIODARONE LOAD VIA INFUSION
150.0000 mg | Freq: Once | INTRAVENOUS | Status: AC
  Administered 2019-06-18: 150 mg via INTRAVENOUS
  Filled 2019-06-18 (×3): qty 83.34

## 2019-06-18 MED ORDER — CYCLOBENZAPRINE HCL 10 MG PO TABS
5.0000 mg | ORAL_TABLET | Freq: Three times a day (TID) | ORAL | Status: DC | PRN
  Administered 2019-06-18 – 2019-06-21 (×2): 5 mg via ORAL
  Filled 2019-06-18 (×4): qty 1

## 2019-06-18 MED ORDER — CHLORHEXIDINE GLUCONATE CLOTH 2 % EX PADS
6.0000 | MEDICATED_PAD | Freq: Every day | CUTANEOUS | Status: DC
  Administered 2019-06-18 – 2019-06-24 (×6): 6 via TOPICAL

## 2019-06-18 MED ORDER — SODIUM CHLORIDE 0.9 % IV SOLN
INTRAVENOUS | Status: DC
  Administered 2019-06-18 (×2): via INTRAVENOUS

## 2019-06-18 MED ORDER — INSULIN GLARGINE 100 UNIT/ML ~~LOC~~ SOLN
15.0000 [IU] | Freq: Every day | SUBCUTANEOUS | Status: DC
  Administered 2019-06-18 – 2019-06-20 (×3): 15 [IU] via SUBCUTANEOUS
  Filled 2019-06-18 (×3): qty 0.15

## 2019-06-18 MED ORDER — INSULIN ASPART 100 UNIT/ML ~~LOC~~ SOLN
2.0000 [IU] | Freq: Every day | SUBCUTANEOUS | Status: DC
  Administered 2019-06-18 – 2019-06-19 (×2): 4 [IU] via SUBCUTANEOUS
  Administered 2019-06-20: 5 [IU] via SUBCUTANEOUS

## 2019-06-18 NOTE — Progress Notes (Signed)
PROGRESS NOTE                                                                                                                                                                                                             Patient Demographics:    Jay Morris, is a 73 y.o. male, DOB - 05/16/1946, MVV:[id number]  Outpatient Primary MD for the patient is Jay Crouch, MD   Admit date - 06/02/2019   LOS - 16  No chief complaint on file.      Brief Narrative: Patient is a 73 y.o. male with PMHx of COPD, bronchial asthma, PAF on Pradaxa, DM-2, HTN-who presented with fever, chills and worsening shortness of breath-he was found to have acute hypoxic respiratory failure secondary to COVID-19 pneumonia.  Hospital course was complicated by progressively worsening hypoxemia requiring 15 L of high flow oxygen and A. fib with RVR.  Patient was treated with steroids, remdesivir, convalescent plasma with slow clinical improvement.   Subjective:    Jay Morris had a small BM earlier this morning.  When he stood up he feels dizzy-orthostatic vital signs were positive.  Foley catheter was discontinued yesterday-he could not urinate-hence Foley catheter was placed back today.  Claims his abdominal pain has resolved today.   Assessment  & Plan :   Acute Hypoxic Resp Failure due to Covid 19 Viral pneumonia: Hypoxia slowly continues to improve-he is now down to 2 L of oxygen.  Has completed a course of both steroids and remdesivir.   Fever: afebrile  O2 requirements:  SpO2: 94 % O2 Flow Rate (L/min): 2 L/min   COVID-19 Labs: No results for input(s): DDIMER, FERRITIN, LDH, CRP in the last 72 hours.  Lab Results  Component Value Date   [name] NEGATIVE 05/31/2019     COVID-19 Medications: Steroids: 11/11>> 11/24 Remdesivir: 11/13>> 11/17 Actemra: Not given Convalescent Plasma: 11/15 x 1  Other  medications: Diuretics:Euvolemic-on oral daily Lasix Antibiotics:Not needed as no evidence of bacterial infection  Prone/Incentive Spirometry: encouraged  incentive spirometry use 3-4/hour.  DVT Prophylaxis  : Pradaxa  Abdominal pain: Suspect either from GERD/dyspepsia from constipation.  CT scan of the abdomen on 11/29 did not show any acute abnormalities.  Abdominal pain has resolved this morning-he had multiple BMs yesterday.  Continue Carafate and PPI.  Acute urinary retention: Developed on 11/27-with discomfort  and inability to urinate-bladder scan showed more than 1000 mL-Foley catheter placed.  Flomax was increased to 0.8 mg-finasteride was added.  Voiding trial on 11/29 was unsuccessful-Foley catheter was reinserted earlier this morning-we will need a voiding trial in the next 1 to 2 weeks.  Will also need outpatient urology follow-up.  Orthostatic hypotension: Secondary to Lasix, intermittent poor oral intake-and numerous BMs that the patient had yesterday.  Will hydrate with IVF-and recheck tomorrow.   PAF with RVR: Rate controlled with just metoprolol-no longer on Cardizem.  Per RN-heart rate seems to be fluctuating more today-hence will transfer to telemetry.  Continue Pradaxa.   AKI on CKD stage IIIa: AKI improved-creatinine back to baseline-follow periodically  DM-2: CBG stable-but borderline hypoglycemic episode this morning-only received Lantus x1 yesterday-decrease Lantus to 15 units nightly-change SSI to moderate scale.  Follow and adjust.  CBG (last 3)  Recent Labs    06/17/19 1956 06/18/19 0726 06/18/19 1134  GLUCAP 380* 70 167*   HTN: Controlled-continue metoprolol-we will resume Lasix starting tomorrow given orthostatic hypotension.  No longer on Cardizem.  Follow and optimize.   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  Consults  :  None  Procedures  :  None  ABG: No results found for: PHART, PCO2ART, PO2ART, HCO3, TCO2, ACIDBASEDEF, O2SAT  Vent Settings: N/A  Condition - Stable  Family Communication  : Spoke with spouse on 11/30  Code Status :  Full Code  Diet :  Diet Order            Diet heart healthy/carb modified Room service appropriate? Yes; Fluid consistency: Thin  Diet effective now               Disposition Plan  :  Remain hospitalized-SNF on discharge  Barriers to discharge: Hypoxia requiring O2 supplementation  Antimicorbials  :    Anti-infectives (From admission, onward)   Start     Dose/Rate Route Frequency Ordered Stop   06/03/19 1000  remdesivir 100 mg in sodium chloride 0.9 % 250 mL IVPB     100 mg 500 mL/hr over 30 Minutes Intravenous Every 24 hours 06/02/19 0511 06/05/19 1129   06/02/19 1600  remdesivir 100 mg in sodium chloride 0.9 % 250 mL IVPB     100 mg 500 mL/hr over 30 Minutes Intravenous Every 24 hours 06/02/19 0511 06/02/19 1657      Inpatient Medications  Scheduled Meds:  albuterol  2 puff Inhalation Q6H   allopurinol  100 mg Oral Daily   atorvastatin  20 mg Oral q1800   dabigatran  150 mg Oral BID   finasteride  5 mg Oral Daily   fluticasone furoate-vilanterol  1 puff Inhalation Daily   [START ON 06/19/2019] furosemide  20 mg Oral Daily   insulin aspart  0-15 Units Subcutaneous TID WC   insulin glargine  15 Units Subcutaneous QHS   metoprolol tartrate  50 mg Oral BID   pantoprazole  40 mg Oral BID AC   polyethylene glycol  17 g Oral BID   senna  2 tablet Oral QHS   sucralfate  1 g Oral TID WC & HS   tamsulosin  0.8 mg Oral QHS   umeclidinium bromide  1 puff Inhalation Daily   Continuous Infusions:  sodium chloride 100 mL/hr at 06/18/19 0900  PRN Meds:.acetaminophen,  alum & mag hydroxide-simeth, bisacodyl, chlorpheniramine-HYDROcodone, cyclobenzaprine, diphenhydrAMINE, guaiFENesin-dextromethorphan, lactulose, metoprolol tartrate, ondansetron **OR** ondansetron (ZOFRAN) IV, promethazine, sodium chloride, traMADol   Time Spent in minutes  25  See all Orders from today for further details   Jay Morris M.D on 06/18/2019 at 2:11 PM  To page go to [email or web address] - use universal password  [facility] -  Office  [phone]    Objective:   Vitals:   06/18/19 1100 06/18/19 1200 06/18/19 1300 06/18/19 1400  BP: 114/65 100/67 (!) 111/52 (!) 123/56  Pulse: 69 (!) 51 (!) 54 (!) 52  Resp: _0 Temp: (!) 97.5 F (36.4 C) 97.8 F (36.6 C) 97.6 F (36.4 C) (!) 97.5 F (36.4 C)  TempSrc: Oral Oral Oral Oral  SpO2: 95% 92% 95% 94%  Weight:      Height:        Wt Readings from Last 3 Encounters:  06/02/19 85.8 kg  06/01/19 84.5 kg  12/29/17 87.3 kg     Intake/Output Summary (Last 24 hours) at 06/18/2019 1411 Last data filed at 06/18/2019 1300 Gross per 24 hour  Intake 561.06 ml  Output 1600 ml  Net -1038.94 ml     Physical Exam Gen Exam:Alert awake-not in any distress HEENT:atraumatic, normocephalic Chest: B/L clear to auscultation anteriorly CVS:S1S2 regular Abdomen:soft non tender, non distended Extremities:no edema Neurology: Non focal Skin: no rash   Data Review:    CBC Recent Labs  Lab 06/15/19 1004  WBC 11.8*  HGB 14.9  HCT 43.8  PLT 188  MCV 95.2  MCH 32.4  MCHC 34.0  RDW 14.6    Chemistries  Recent Labs  Lab 06/14/19 0220 06/15/19 1004 06/16/19 0836  NA 133* 132* 133*  K 4.4 4.4 4.4  CL 97* 96* 95*  CO2 _1 GLUCOSE 126* 150* 107*  BUN 53* 42* 33*  CREATININE 1.59* 1.68* 1.57*  CALCIUM 8.3* 7.8* 7.9*  AST  --  32  --   ALT  --  59*  --   ALKPHOS  --  33*  --   BILITOT  --  1.7*  --     ------------------------------------------------------------------------------------------------------------------ No results for input(s): CHOL, HDL, LDLCALC, TRIG, CHOLHDL, LDLDIRECT in the last 72 hours.  Lab Results  Component Value Date   HGBA1C 7.5 (H) 05/31/2019   ------------------------------------------------------------------------------------------------------------------ No results for input(s): TSH, T4TOTAL, T3FREE, THYROIDAB in the last 72 hours.  Invalid input(s): FREET3 ------------------------------------------------------------------------------------------------------------------ No results for input(s): VITAMINB12, FOLATE, FERRITIN, TIBC, IRON, RETICCTPCT in the last 72 hours.  Coagulation profile No results for input(s): INR, PROTIME in the last 168 hours.  No results for input(s): DDIMER in the last 72 hours.  Cardiac Enzymes No results for input(s): CKMB, TROPONINI, MYOGLOBIN in the last 168 hours.  Invalid input(s): CK ------------------------------------------------------------------------------------------------------------------    Component Value Date/Time   BNP 37.0 05/31/2019 0616    Micro Results No results found for this or any previous visit (from the past 240 hour(s)).  Radiology Reports Ct Abdomen Pelvis Wo Contrast  Result Date: 06/17/2019 CLINICAL DATA:  Abdominal distension. EXAM: CT ABDOMEN AND PELVIS WITHOUT CONTRAST TECHNIQUE: Multidetector CT imaging of the abdomen and pelvis was performed following the standard protocol without IV contrast. COMPARISON:  Chest x-ray May 31, 2019. CT abdomen and pelvis May 31, 2019. FINDINGS: Lower chest: There are patchy infiltrates in the bases and right middle lobe which are new in the interval, worrisome for an infectious process. Atypical infections should  be considered. Lung bases are otherwise normal. Lower chest is otherwise normal. Hepatobiliary: A low-attenuation lesion in the right  hepatic lobe on series 2, image 17 is stable and remains too small to characterize. Statistically, this is most likely a small benign lesion such as a cyst. No other liver abnormalities are identified. The gallbladder is normal. The portal veins not assessed due to lack of contrast. Pancreas: Unremarkable. No pancreatic ductal dilatation or surrounding inflammatory changes. Spleen: Normal in size without focal abnormality. Adrenals/Urinary Tract: Bilateral perinephric stranding is stable and likely nonacute. The kidneys and ureters are otherwise normal. There is a Foley catheter in the bladder. The bladder is partially decompressed. There is air in the bladder is well, likely due to the Foley catheter. The fluid in the bladder demonstrates an attenuation of 49 Hounsfield units. Stomach/Bowel: Stomach is within normal limits. Appendix appears normal. No evidence of bowel wall thickening, distention, or inflammatory changes. Vascular/Lymphatic: Calcified atherosclerosis is seen in the nonaneurysmal aorta. No adenopathy. Reproductive: Prostate is unremarkable. Other: No abdominal wall hernia or abnormality. No abdominopelvic ascites. Musculoskeletal: No acute or significant osseous findings. IMPRESSION: 1. Patchy infiltrates in the lung bases and right middle lobe are new in the interval, worrisome for an infectious process. Atypical infections should be considered. 2. The bladder is partially decompressed with a Foley catheter. The fluid in the bladder demonstrates an attenuation of 49 Hounsfield units. This could represent blood products. Recommend clinical correlation. 3. Atherosclerosis in the nonaneurysmal aorta. Aortic Atherosclerosis (ICD10-I70.0). Electronically Signed   By: Dorise Bullion III M.D   On: 06/17/2019 11:22   Ct Abdomen Pelvis Wo Contrast  Result Date: 05/31/2019 CLINICAL DATA:  Weight loss, unintended, non-localized Abdominal pain, nausea vomiting and diarrhea. Shortness of breath. Fevers  and chills over the last 2 days. Hypoxia EXAM: CT ABDOMEN AND PELVIS WITHOUT CONTRAST TECHNIQUE: Multidetector CT imaging of the abdomen and pelvis was performed following the standard protocol without IV contrast. COMPARISON:  CT of the abdomen and pelvis 11/21/2017 FINDINGS: Lower chest: Scarring of the lingula and right middle lobe is stable. Minimal dependent atelectasis is present. Heart size is normal. Hepatobiliary: There is diffuse fatty infiltration of the liver. No discrete lesions are present. The common bile duct and gallbladder are normal. Pancreas: Unremarkable. No pancreatic ductal dilatation or surrounding inflammatory changes. Spleen: Normal in size without focal abnormality. Adrenals/Urinary Tract: Adrenal glands are normal bilaterally. Kidneys and ureters are within normal limits. There is no stone or mass lesion. No obstruction is present. The urinary bladder is distended. No focal abnormality is present. Stomach/Bowel: The stomach and duodenum are within normal limits. Small bowel is unremarkable. Terminal ileum is within normal limits. Appendix is visualized and normal. The ascending and transverse colon are normal. The descending and sigmoid colon are within normal limits. Vascular/Lymphatic: Atherosclerotic calcifications are present in the aorta and branch vessels 50% narrowing is present at the origin of the SMA. Moderate stenosis is present at the origin of the IMA. Dense atherosclerotic calcifications are present in the proximal iliac arteries bilaterally. Reproductive: Prostate is unremarkable. Other: No abdominal wall hernia or abnormality. No abdominopelvic ascites. Musculoskeletal: Advanced degenerative changes are present in the lower lumbar spine. No acute abnormalities are present. Bony pelvis is within normal limits. The hips are located and within normal limits. IMPRESSION: 1. No acute or focal lesion to explain the patient's symptoms. 2. Hepatic steatosis. 3. 50% stenosis at the  origin of the SMA and moderate stenosis at the origin of the IMA.  4. Aortic Atherosclerosis (ICD10-I70.0). Electronically Signed   By: San Morelle M.D.   On: 05/31/2019 07:11   Dg Abd 1 View  Result Date: 06/08/2019 CLINICAL DATA:  Constipation EXAM: ABDOMEN - 1 VIEW COMPARISON:  CT abdomen pelvis-05/31/2019; chest radiograph-earlier same day FINDINGS: Moderate gaseous distention of the colon without upstream distension of the small bowel. Unremarkable colonic stool burden. Nondiagnostic evaluation for pneumoperitoneum secondary to supine positioning and exclusion of the lower thorax. No pneumatosis or portal venous gas. No definitive abnormal intra-abdominal calcifications. Degenerative change the lower lumbar spine and bilateral hips is suspected though incompletely evaluated. IMPRESSION: 1. Mild gaseous distention of the colon without evidence of enteric obstruction. 2. Unremarkable colonic stool burden. Electronically Signed   By: Sandi Mariscal M.D.   On: 06/08/2019 08:33   Nm Pulmonary Perf And Vent  Result Date: 06/01/2019 CLINICAL DATA:  Hypoxemia, shortness of breath since Tuesday, COPD, asthma, question pulmonary embolism EXAM: NUCLEAR MEDICINE VENTILATION - PERFUSION LUNG SCAN TECHNIQUE: Ventilation images were obtained in multiple projections using inhaled aerosol Tc-60mDTPA. Perfusion images were obtained in multiple projections after intravenous injection of Tc-91mAA. RADIOPHARMACEUTICALS:  31.98 mCi of Tc-9944mPA aerosol inhalation and 4.12 mCi Tc99m3m IV COMPARISON:  None Correlation: Chest radiograph 05/31/2019 FINDINGS: Ventilation: Overall did irregular diminished ventilation within the upper lobes. Focal ventilatory defect lateral aspect RIGHT upper lobe. Prominent LEFT hilum. Perfusion: Matching perfusion defect lateral RIGHT upper lobe. Remaining perfusion normal. No additional segmental or subsegmental perfusion defects. Chest radiograph: Scattered interstitial prominence  in the periphery of the lungs bilaterally greater in RIGHT upper lobe at site of ventilation and perfusion abnormality. IMPRESSION: Low probability for pulmonary embolism. Electronically Signed   By: MarkLavonia Dana.   On: 06/01/2019 08:21   Dg Chest Portable 1 View  Result Date: 05/31/2019 CLINICAL DATA:  Shortness of breath EXAM: PORTABLE CHEST 1 VIEW COMPARISON:  January 05, 2016 FINDINGS: The heart size and mediastinal contours are within normal limits. Aortic knob calcifications are seen. There is minimally increased interstitial markings seen at the periphery. There is bibasilar subsegmental atelectasis. No large airspace consolidation or pleural effusion. No acute osseous abnormality. IMPRESSION: Mildly increased interstitial markings which could be due to interstitial edema. Electronically Signed   By: BindPrudencio Pair.   On: 05/31/2019 03:52   Dg Abd Portable 2v  Result Date: 06/15/2019 CLINICAL DATA:  Vomiting. EXAM: PORTABLE ABDOMEN - 2 VIEW COMPARISON:  Abdominal radiograph dated 06/08/2019 and chest x-ray dated 05/31/2019 FINDINGS: The bowel gas pattern is normal. No dilated loops of large or small bowel. No free air in the abdomen. No acute bone abnormality. The patient appears to have hazy peripheral infiltrates at the lung bases which appear to be new since 05/31/2019. IMPRESSION: 1. Benign-appearing abdomen. 2. There appear to be new peripheral infiltrates at the left and right lung bases. Electronically Signed   By: JameLorriane Shire.   On: 06/15/2019 09:31

## 2019-06-18 NOTE — Progress Notes (Addendum)
   06/18/19 0730  Vitals  Temp (!) 97.5 F (36.4 C)  Temp Source Oral  BP (!) 75/60  MAP (mmHg) 66  BP Location Left Arm  BP Method Automatic  Patient Position (if appropriate) Sitting  Pulse Rate (!) 115  Pulse Rate Source Monitor (ekg MACHINE )  Cardiac Rhythm Atrial flutter  Resp 18  Level of Consciousness  Level of Consciousness Alert  Oxygen Therapy  SpO2 97 %  O2 Device Nasal Cannula  O2 Flow Rate (L/min) 3 L/min  MEWS Score  MEWS RR 0  MEWS Pulse 2  MEWS Systolic 2  MEWS LOC 0  MEWS Temp 0  MEWS Score 4  MEWS Score Color Red  MEWS Assessment  Is this an acute change? Yes  MEWS guidelines implemented *See [place]  Provider Notification  Provider Name/Title dR [place]  Date Provider Notified 06/18/19  Time Provider Notified [phone]  Notification Type Call  Notification Reason Change in status  Response See new orders  Date of Provider Response 06/18/19  Time of Provider Response 0739    Arrived to pt room. Pt sitting on bedside commode reports not feeling well. Blood sugar 70. Orange Juice given while awaiting breakfast. Low BP noted and HR as high as 160's not sustained at times. Very weak pulses. Assisted pt back to bed. Vitals then became stable.  MD Notified. 12 Lead EKG obtained. Pt in A-flutter and 80's-120's, BP increased once getting back to bed. IV fluids started per order. [place] Protocol started. No RRT called per MD request.  Pt seems very orthostatic. MD reports pt should improve with Fluids. Pt reports no void since foley cath removed at 2pm yesterday. Bladder scan of 600. MD ordered foley at this time. 96m obtained in foley cath dark amber and clear. Pt reports feeling better after interventions.

## 2019-06-18 NOTE — Progress Notes (Signed)
MD notified of pt Heart rate dropping at times and then being very high at times. Orders to transfer pt downstairs to tele monitor so he can better understand what pts heart rate is doing.

## 2019-06-18 NOTE — Progress Notes (Addendum)
Transfer to telemetry-for close heart rate monitoring as his heart rate was was erratic on MedSurg unit.  Per RN-in A. fib with RVR-rate in the 140s to 160s.  Blood pressure with systolics in the low 375G.  Spoke with cardiology-have ordered amiodarone infusion.

## 2019-06-18 NOTE — Progress Notes (Signed)
Weaned 2 L HFNC sats dropped from 94% to 83%.. placed back on 4 L HFNC sats increased to 92%

## 2019-06-18 NOTE — Progress Notes (Signed)
Rapid Response Event Note  Overview: RN called for rapid response assessment due to sustained atrial flutter in the 160s with hypotension. RN reported earlier bradycardia in the 30s. Called at 1430 arrived at 1435.     Initial Focused Assessment: Patient alert and oriented in no distress or shortness of breath. Pink warm skin with brisk capillary refill. Lung sounds diminished. Blood pressure with systolic of 87 and MAP of 77.   Interventions: Vagal maneuver attempted with no change in heart rate. 2nd PIV started in right forearm for additional vascular acess. 124m amiodarone bolus administered and initial amiodarone drip started. Supplemental oxygen able to be reduced to 5L nasal cannula.   Plan of Care (if not transferred):  Dr. GSloan Leiterpaged and came to the bedside. Patient will remain inpatient and on amiodarone drip. Bedside RN encouraged to call ICU CN if you need additional support [phone].   AElspeth Cho

## 2019-06-18 NOTE — Progress Notes (Signed)
Physical Therapy Treatment Patient Details Name: Jay Morris MRN: [id number] DOB: 02/20/1946 Today's Date: 06/18/2019    History of Present Illness From MD H&P: Pt is a 73 y.o. male with a known history of asthma, atrial fibrillation, COPD, type 2 diabetes mellitus, hypertension and dyslipidemia, presented to the emergency room with acute onset of 2-day history of fever with a T-max of 103 and chills with associated body aching, generalized weakness, fatigue and malaise.  He admitted to dyspnea with paroxysmal nocturnal dyspnea and dyspnea on exertion without orthopnea or lower extremity edema.  He admitted to nausea and vomiting as well as watery diarrhea.  He admitted to mild epigastric and infraumbilical abdominal pain.  He lost about 10 pounds during the last 4 days.  He had mild dizziness without headache or diplopia.  Upon presentation to the emergency room, temperature was 99.3 respiratory to 25, heart rate 82, blood pressure 150/66 and pulse oximetry 91% on room air dropped to 82% upon ambulation for short distance.  CBC showed anemia with hemoglobin of 12.5 hematocrit of 37.1 better than his previous levels and very close to his baseline.  For which x-ray showed mild increase interstitial markings which could be due to interstitial pulmonary edema.  He will be admitted to a medical monitored bed for further evaluation and management.  MD assessment includes: Acute kidney injury superimposed on chronic kidney disease stage IIIa, N&V, diarrhea, DM II with hyperglycemia, A-fib, and general weakness; he was found to have acute hypoxic respiratory failure secondary to COVID-19 pneumonia.  Hospital course was complicated by progressively worsening hypoxemia requiring 15 L of high flow oxygen and A. fib with RVR.    PT Comments    Noted pt's episode of orthostasis and a-flutter with nursing while on [facility] earlier today. RN reports pt has had IV fluids and HR stable and OK to attempt PT. Began with  supine exercises to help "prime" his BP. Patient again with symptomatic orthostasis upon sitting EOB and returned to supine.  Orthostatic Vitals: Pt Position BP HR  Supine 118/55 80 (manual)  Sitting 81/64 Pulse oximeter inaccurate  Standing -- --  Standing 3 min -- --   Returned sit to supine with BP             100/67                                 88 (manual)       Patient understanding of continued recommendation for SNF--unfortunately his medical issues have limited his ability to progress with PT to a safe level to discharge home. Will continue to follow.     Follow Up Recommendations  SNF     Equipment Recommendations  Rolling walker with 5" wheels    Recommendations for Other Services       Precautions / Restrictions Precautions Precautions: Fall Precaution Comments: orthostasis Restrictions Weight Bearing Restrictions: No    Mobility  Bed Mobility Overal bed mobility: Needs Assistance Bed Mobility: Supine to Sit     Supine to sit: Min guard Sit to supine: Supervision   General bed mobility comments: close guarding due to episode of orthostasis and tachycardia with RN; has since had IV fluids; Pt became mildly dizzy at EOB and returned to supine due to +orthostasis  Transfers                    Ambulation/Gait  Stairs             Wheelchair Mobility    Modified Rankin (Stroke Patients Only)       Balance Overall balance assessment: Needs assistance Sitting-balance support: Feet unsupported Sitting balance-Leahy Scale: Fair                                      Cognition Arousal/Alertness: Awake/alert Behavior During Therapy: WFL for tasks assessed/performed Overall Cognitive Status: Within Functional Limits for tasks assessed                                        Exercises Total Joint Exercises Ankle Circles/Pumps: AROM;Both;10 reps Quad Sets: Both;10  reps;Supine Marching in Standing: AROM;Both;5 reps;Supine Other Exercises Other Exercises: exercises prior to attempt sitting EOB for BP support    General Comments        Pertinent Vitals/Pain Pain Assessment: Faces Faces Pain Scale: Hurts a little bit Pain Location: legs Pain Descriptors / Indicators: Cramping Pain Intervention(s): Limited activity within patient's tolerance;Monitored during session;Premedicated before session    Home Living                      Prior Function            PT Goals (current goals can now be found in the care plan section) Acute Rehab PT Goals Patient Stated Goal: states is feeling weak Time For Goal Achievement: 06/22/19 Potential to Achieve Goals: Fair Progress towards PT goals: Not progressing toward goals - comment(due to orthostasis)    Frequency    Min 2X/week      PT Plan Current plan remains appropriate    Co-evaluation              AM-PAC PT "6 Clicks" Mobility   Outcome Measure  Help needed turning from your back to your side while in a flat bed without using bedrails?: None Help needed moving from lying on your back to sitting on the side of a flat bed without using bedrails?: A Little Help needed moving to and from a bed to a chair (including a wheelchair)?: A Little Help needed standing up from a chair using your arms (e.g., wheelchair or bedside chair)?: Total Help needed to walk in hospital room?: Total Help needed climbing 3-5 steps with a railing? : Total 6 Click Score: 13    End of Session Equipment Utilized During Treatment: Oxygen Activity Tolerance: Treatment limited secondary to medical complications (Comment) Patient left: with call bell/phone within reach;in bed Nurse Communication: Other (comment)(orthostasis) PT Visit Diagnosis: Difficulty in walking, not elsewhere classified (R26.2);Muscle weakness (generalized) (M62.81)     Time: 7395-8441 PT Time Calculation (min) (ACUTE ONLY):  19 min  Charges:  $Therapeutic Exercise: 8-22 mins                      Barry Brunner, PT Pager [phone]    Rexanne Mano 06/18/2019, 11:43 AM

## 2019-06-18 NOTE — Progress Notes (Signed)
RT responed to a Rapid Response call with the ICU Charge RN due to Afib.  Patient on a NRB at 15 L, sats 99%.  No distress at this time.

## 2019-06-18 NOTE — TOC Progression Note (Signed)
Transition of Care [facility]) - Progression Note    Patient Details  Name: Jay Morris MRN: [id number] Date of Birth: 11-25-1945  Transition of Care [facility]) CM/SW Contact  Joaquin Courts, RN Phone Number: 06/18/2019, 1:15 PM  Clinical Narrative:    CM spoke with patient's spouse after being unable to reach patient on room telephone. CM presented bed offers, and spouse selects [facility]. Facility rep notified of choice and will initiate insurance auth. CM will follow for confirmation of insurance auth and coordinate dc once Josem Kaufmann is received.    Expected Discharge Plan: Skilled Nursing Facility Barriers to Discharge: Ship broker, Continued Medical Work up  Expected Discharge Plan and Services Expected Discharge Plan: [place]   Discharge Planning Services: CM Consult Post Acute Care Choice: [place] Living arrangements for the past 2 months: Single Family Home                 DME Arranged: N/A DME Agency: NA       HH Arranged: NA HH Agency: NA         Social Determinants of Health (SDOH) Interventions    Readmission Risk Interventions No flowsheet data found.

## 2019-06-18 NOTE — Progress Notes (Addendum)
Transferred pt to room 160 to monitor HR. Bedside report given to [facility]. Once pt on monitor pt was in 160's sustained, not feeling well. Pt then began to require more oxygen needs and dropped as low at 80% on 15L HFNC. Changed pt to 100% non rebreather. ICU Charge nurse called to come look at pt. MD on phone giving ordereds for amiodarone  Drip. Once RRT arrived pt was sating 98% on 100% nonrebreather. Pt still not feeling well but stable. 20G 2nd IV started to right FA. This RN left room. ICU charge and primary RN remain at the bedside.

## 2019-06-18 NOTE — Progress Notes (Signed)
Pt transferred placed on tele HR 166, 02 94%, bp 104/68, bp dropped to 87/71 called RRT, and MD. Both to bedside. See new orders

## 2019-06-18 NOTE — Progress Notes (Signed)
  Amiodarone Drug - Drug Interaction Consult Note  Recommendations: Amiodarone is metabolized by the cytochrome P450 system and therefore has the potential to cause many drug interactions. Amiodarone has an average plasma half-life of 50 days (range 20 to 100 days).   There is potential for drug interactions to occur several weeks or months after stopping treatment and the onset of drug interactions may be slow after initiating amiodarone.   _0  Statins: Increased risk of myopathy. Simvastatin- restrict dose to 48m daily. Other statins: counsel patients to report any muscle pain or weakness immediately. - Atorvastatin 285mdaily.  _1  Anticoagulants: Amiodarone can increase anticoagulant effect. Consider warfarin dose reduction. Patients should be monitored closely and the dose of anticoagulant altered accordingly, remembering that amiodarone levels take several weeks to stabilize.  - Pradaxa 15030mID  _2  Antiepileptics: Amiodarone can increase plasma concentration of phenytoin, the dose should be reduced. Note that small changes in phenytoin dose can result in large changes in levels. Monitor patient and counsel on signs of toxicity.  _3  Beta blockers: increased risk of bradycardia, AV block and myocardial depression. Sotalol - avoid concomitant use.  - Metoprolol 50 mg BID.  _4   Calcium channel blockers (diltiazem and verapamil): increased risk of bradycardia, AV block and myocardial depression.  _5   Cyclosporine: Amiodarone increases levels of cyclosporine. Reduced dose of cyclosporine is recommended.  _6  Digoxin dose should be halved when amiodarone is started.  _7  Diuretics: increased risk of cardiotoxicity if hypokalemia occurs.  - Furosemide 87m16mily, next on 12/1.  K 4.4  _8  Oral hypoglycemic agents (glyburide, glipizide, glimepiride): increased risk of hypoglycemia. Patient's glucose levels should be monitored closely when initiating amiodarone therapy.   _9  Drugs that  prolong the QT interval:  Torsades de pointes risk may be increased with concurrent use - avoid if possible.  Monitor QTc, also keep magnesium/potassium WNL if concurrent therapy can't be avoided. . AnMarland Kitchenibiotics: e.g. fluoroquinolones, erythromycin. . Antiarrhythmics: e.g. quinidine, procainamide, disopyramide, sotalol. . Antipsychotics: e.g. phenothiazines, haloperidol.  . Lithium, tricyclic antidepressants, and methadone.   Thank You,  ChriGretta ArabrmD, BCPS Clinical pharmacist phone 7am- 5pm: [phone]/2020 2:59 PM

## 2019-06-19 ENCOUNTER — Inpatient Hospital Stay (HOSPITAL_COMMUNITY): Payer: Medicare HMO

## 2019-06-19 DIAGNOSIS — K5900 Constipation, unspecified: Secondary | ICD-10-CM

## 2019-06-19 LAB — GLUCOSE, CAPILLARY
Glucose-Capillary: 81 mg/dL (ref 70–99)
Glucose-Capillary: 110 mg/dL — ABNORMAL HIGH (ref 70–99)
Glucose-Capillary: 325 mg/dL — ABNORMAL HIGH (ref 70–99)
Glucose-Capillary: 329 mg/dL — ABNORMAL HIGH (ref 70–99)

## 2019-06-19 LAB — COMPREHENSIVE METABOLIC PANEL
ALT: 30 U/L (ref 0–44)
AST: 23 U/L (ref 15–41)
Albumin: 2.3 g/dL — ABNORMAL LOW (ref 3.5–5.0)
Alkaline Phosphatase: 28 U/L — ABNORMAL LOW (ref 38–126)
Anion gap: 9 (ref 5–15)
BUN: 21 mg/dL (ref 8–23)
CO2: 24 mmol/L (ref 22–32)
Calcium: 8.1 mg/dL — ABNORMAL LOW (ref 8.9–10.3)
Chloride: 102 mmol/L (ref 98–111)
Creatinine, Ser: 1.23 mg/dL (ref 0.61–1.24)
GFR calc Af Amer: >60 mL/min (ref >60)
GFR calc non Af Amer: 57 mL/min — ABNORMAL LOW (ref >60)
Glucose, Bld: 107 mg/dL — ABNORMAL HIGH (ref 70–99)
Potassium: 4.1 mmol/L (ref 3.5–5.1)
Sodium: 135 mmol/L (ref 135–145)
Total Bilirubin: 0.8 mg/dL (ref 0.3–1.2)
Total Protein: 5.2 g/dL — ABNORMAL LOW (ref 6.5–8.1)

## 2019-06-19 LAB — CBC
HCT: 38.5 % — ABNORMAL LOW (ref 39.0–52.0)
Hemoglobin: 12.7 g/dL — ABNORMAL LOW (ref 13.0–17.0)
MCH: 31.3 pg (ref 26.0–34.0)
MCHC: 33 g/dL (ref 30.0–36.0)
MCV: 94.8 fL (ref 80.0–100.0)
Platelets: 109 10*3/uL — ABNORMAL LOW (ref 150–400)
RBC: 4.06 MIL/uL — ABNORMAL LOW (ref 4.22–5.81)
RDW: 14.8 % (ref 11.5–15.5)
WBC: 6.7 10*3/uL (ref 4.0–10.5)
nRBC: 0 % (ref 0.0–0.2)

## 2019-06-19 LAB — BRAIN NATRIURETIC PEPTIDE: B Natriuretic Peptide: 133.1 pg/mL — ABNORMAL HIGH (ref 0.0–100.0)

## 2019-06-19 MED ORDER — METOPROLOL TARTRATE 25 MG PO TABS
25.0000 mg | ORAL_TABLET | Freq: Two times a day (BID) | ORAL | Status: DC
  Administered 2019-06-19: 25 mg via ORAL

## 2019-06-19 MED ORDER — MIDODRINE HCL 2.5 MG PO TABS
2.5000 mg | ORAL_TABLET | Freq: Two times a day (BID) | ORAL | Status: DC
  Administered 2019-06-19 – 2019-06-25 (×13): 2.5 mg via ORAL
  Filled 2019-06-19 (×16): qty 1

## 2019-06-19 MED ORDER — FUROSEMIDE 10 MG/ML IJ SOLN
20.0000 mg | Freq: Two times a day (BID) | INTRAMUSCULAR | Status: DC
  Administered 2019-06-19 – 2019-06-20 (×2): 20 mg via INTRAVENOUS
  Filled 2019-06-19 (×2): qty 2

## 2019-06-19 MED ORDER — AMIODARONE LOAD VIA INFUSION
150.0000 mg | Freq: Once | INTRAVENOUS | Status: AC
  Administered 2019-06-19: 150 mg via INTRAVENOUS
  Filled 2019-06-19 (×2): qty 83.34

## 2019-06-19 MED ORDER — TAMSULOSIN HCL 0.4 MG PO CAPS
0.4000 mg | ORAL_CAPSULE | Freq: Every day | ORAL | Status: DC
  Administered 2019-06-19 – 2019-06-24 (×6): 0.4 mg via ORAL
  Filled 2019-06-19 (×6): qty 1

## 2019-06-19 MED ORDER — AMIODARONE HCL IN DEXTROSE 360-4.14 MG/200ML-% IV SOLN
60.0000 mg/h | INTRAVENOUS | Status: AC
  Administered 2019-06-19: 60 mg/h via INTRAVENOUS
  Filled 2019-06-19: qty 200

## 2019-06-19 MED ORDER — POLYETHYLENE GLYCOL 3350 17 G PO PACK
17.0000 g | PACK | Freq: Three times a day (TID) | ORAL | Status: DC
  Administered 2019-06-19 – 2019-06-25 (×13): 17 g via ORAL
  Filled 2019-06-19 (×15): qty 1

## 2019-06-19 MED ORDER — LACTULOSE 10 GM/15ML PO SOLN
30.0000 g | Freq: Three times a day (TID) | ORAL | Status: DC | PRN
  Administered 2019-06-19 – 2019-06-21 (×3): 30 g via ORAL
  Filled 2019-06-19: qty 45

## 2019-06-19 MED ORDER — AMIODARONE HCL IN DEXTROSE 360-4.14 MG/200ML-% IV SOLN
30.0000 mg/h | INTRAVENOUS | Status: DC
  Administered 2019-06-19 – 2019-06-23 (×7): 30 mg/h via INTRAVENOUS
  Filled 2019-06-19 (×11): qty 200

## 2019-06-19 MED ORDER — AMIODARONE HCL 100 MG PO TABS
200.0000 mg | ORAL_TABLET | Freq: Two times a day (BID) | ORAL | Status: DC
  Administered 2019-06-19: 200 mg via ORAL
  Filled 2019-06-19: qty 2

## 2019-06-19 NOTE — Progress Notes (Signed)
   06/19/19 1324  Oxygen Therapy  SpO2 (!) 83 %  O2 Device Non-rebreather Mask  Applied non rebreather. sats recovered to 100%

## 2019-06-19 NOTE — Progress Notes (Addendum)
Facetime daughter for update. Pt expressed concerns to daughter that staff was not communicating with him. He hasn't had a BM In 2 day and he was not happy. Daughter states she did not get a call last night from nurse and has not talked to MD. notified MD of pt and daughter concerns. Pt has not expressed any concerns to the nurse yesterday or today. Informed to please tell me his concerns so I can take care of them, otherwise I do not know. Told him we are here to help and want to make his stay comfortable as possible.

## 2019-06-19 NOTE — Progress Notes (Signed)
Pt having multiple episodes of pauses and bradycardia. MD on call notified. Orders received to hold the amiodarone gtt at this time.

## 2019-06-19 NOTE — Progress Notes (Signed)
   06/19/19 1310  Vitals  BP (!) 86/63  MAP (mmHg) 71  ECG Heart Rate (!) 130  Resp 18  MEWS Score  MEWS RR 0  MEWS Pulse 3  MEWS Systolic 1  MEWS LOC 0  MEWS Temp 0  MEWS Score 4  MEWS Score Color Red  Notified MD Ghimire and charge nurse see nre orders

## 2019-06-19 NOTE — TOC Progression Note (Signed)
Transition of Care [facility]) - Progression Note    Patient Details  Name: Jay Morris MRN: [id number] Date of Birth: 10-Aug-1945  Transition of Care [facility]) CM/SW Contact  Joaquin Courts, RN Phone Number: 06/19/2019, 11:31 AM  Clinical Narrative:    CM reached out to [facility], insurance Josem Kaufmann is still pending.    Expected Discharge Plan: Skilled Nursing Facility Barriers to Discharge: Ship broker, Continued Medical Work up  Expected Discharge Plan and Services Expected Discharge Plan: [place]   Discharge Planning Services: CM Consult Post Acute Care Choice: [place] Living arrangements for the past 2 months: Single Family Home                 DME Arranged: N/A DME Agency: NA       HH Arranged: NA HH Agency: NA         Social Determinants of Health (SDOH) Interventions    Readmission Risk Interventions No flowsheet data found.

## 2019-06-19 NOTE — Progress Notes (Signed)
Called pt's wife, Jay Morris, to give update on pt. Informed wife of pt's condition change with going into a flutter and informed her of POC with medication and monitoring. She verbalizes understanding. All questions answered at this time.

## 2019-06-19 NOTE — Progress Notes (Signed)
Updated daughter on pt's status.

## 2019-06-19 NOTE — Progress Notes (Signed)
Inpatient Diabetes Program Recommendations  AACE/ADA: New Consensus Statement on Inpatient Glycemic Control (2015)  Target Ranges:  Prepandial:   less than 140 mg/dL      Peak postprandial:   less than 180 mg/dL (1-2 hours)      Critically ill patients:  140 - 180 mg/dL   Lab Results  Component Value Date   GLUCAP 110 (H) 06/19/2019   HGBA1C 7.5 (H) 05/31/2019   History:DM, COPD   Home DM Meds: Tresiba 120 units QHS Novolog 30 units TID Metformin 1000 mgBID Current orders for Inpatient glycemic control:Lantus 15 units q HS + Novolog moderate correction tid + hs  Inpatient Diabetes Program Recommendations:    Insulin needs drastically reduced from outpatient needs. -  May consider adding Novolog 2 units tid with meals (hold if patient eats less than 50%).    Thanks,  Adah Perl, RN, BC-ADM Inpatient Diabetes Coordinator Pager [phone] (8a-5p)

## 2019-06-19 NOTE — Progress Notes (Signed)
notified MD Ghinmire HR 60-70 do you want me to hold or give 53m of lopressor. New orders to give 227m

## 2019-06-19 NOTE — Progress Notes (Addendum)
PROGRESS NOTE                                                                                                                                                                                                             Patient Demographics:    Jay Morris, is a 73 y.o. male, DOB - 08/12/1945, YLT:[id number]  Outpatient Primary MD for the patient is [name], MD   Admit date - 06/02/2019   LOS - 17  No chief complaint on file.      Brief Narrative: Patient is a 73 y.o. male with PMHx of COPD, bronchial asthma, PAF on Pradaxa, DM-2, HTN-who presented with fever, chills and worsening shortness of breath-he was found to have acute hypoxic respiratory failure secondary to COVID-19 pneumonia.  Hospital course was complicated by progressively worsening hypoxemia requiring 15 L of high flow oxygen and A. fib with RVR.  Patient was treated with steroids, remdesivir, convalescent plasma with slow clinical improvement.  Further hospital course lately has been complicated by acute urinary retention requiring Foley catheter insertion, constipation requiring laxative/enema and A. fib with RVR requiring amiodarone infusion.   Subjective:    [name] was transferred to PCU on 11/30-after he developed A. fib with RVR with mild hypotension-he was started on amiodarone infusion and rate was controlled.  Overnight due to episodes of bradycardia and pauses-amiodarone infusion was discontinued.  He feels better this morning.  Unfortunately-this afternoon-RVR reoccurred.   Assessment  & Plan :   Acute Hypoxic Resp Failure due to Covid 19 Viral pneumonia: He has improved-has completed a course of steroids and remdesivir.  Unfortunately when he goes into A. fib with RVR-he requires a higher amount of FiO2-suspect from decompensated diastolic heart failure-we will be getting IV Lasix later today.  Continued attempts to slowly titrate  down FiO2.    Fever: afebrile  O2 requirements:  SpO2: (!) 87 % O2 Flow Rate (L/min): 5 L/min   COVID-19 Labs: No results for input(s): DDIMER, FERRITIN, LDH, CRP in the last 72 hours.  Lab Results  Component Value Date   [place] NEGATIVE 05/31/2019     COVID-19 Medications: Steroids: 11/11>> 11/24 Remdesivir: 11/13>> 11/17 Actemra: Not given Convalescent Plasma: 11/15 x 1  Other medications: Diuretics:Euvolemic Antibiotics:Not needed as no evidence of bacterial infection  Prone/Incentive Spirometry: encouraged  incentive spirometry use 3-4/hour.  DVT Prophylaxis  : Pradaxa  Paroxysmal a flutter/A. fib with RVR: Has a longstanding history of atrial fibrillation-apparently is s/p ablation at [facility] approximately 6-7 years back.  Chronically maintained on Pradaxa.  He developed A. fib with RVR again on 11/30-he had mild hypotension and lightheadedness-hence was started on amiodarone infusion after discussion with cardiology.  Overnight-amiodarone infusion was discontinued as patient developed episodes of bradycardia and sinus pauses (up to 2.4 seconds).  I continued his beta-blocker-and had switched him to oral amiodarone-but this afternoon-I was informed by RN that the patient again went into RVR.  I discussed case with [facility]- on-call-Dr. Christopher-who reviewed the chart-recommended that we restart IV amiodarone-and stop beta-blocker (given probably excessive beta-blockade-with pauses/bradycardia overnight).   Decompensated diastolic heart failure: Likely secondary to RVR-restart Lasix-this was held on 11/30 as patient was orthostatic.  Abdominal pain: Suspect either from GERD/dyspepsia from constipation.  CT scan of the abdomen on 11/29 did not show any acute abnormalities.  Abdominal pain has resolved this morning-he is maintained on MiraLAX and senna-I have asked RN to give lactulose today as he is not had a bowel movement yet.  Continue Carafate and PPI.  He had  multiple BMs yesterday.  Continue Carafate and PPI.  Acute urinary retention: Developed on 11/27-with discomfort and inability to urinate-bladder scan showed more than 1000 mL-Foley catheter placed.  Flomax was increased to 0.8 mg-finasteride was added.  Voiding trial on 11/29 was unsuccessful-Foley catheter was reinserted on 12/1-plans are for a voiding trial.  However given his orthostatic hypotension and soft blood pressure-we will decrease Flomax back to 0.4 mg.  Patient will need outpatient follow-up with urology.   Orthostatic hypotension: Secondary to Lasix, intermittent poor oral intake-and numerous BMs that the patient had yesterday.  However-Per patient this is a chronic issue-and he claims that he actually sits at the edge of the bed before he gets up.    AKI on CKD stage IIIa: AKI improved-creatinine back to baseline-follow periodically  DM-2: CBG stable-continue Lantus 15 units nightly and SSI.  Follow and adjust.    CBG (last 3)  Recent Labs    06/18/19 2049 06/19/19 0728 06/19/19 1144  GLUCAP 350* 81 110*   HTN: Blood pressure controlled-stopping metoprolol-in order to allow moderate control with amiodarone.  Constipation: Remains on maintenance MiraLAX/senna/as needed lactulose, Dulcolax.  Per patient-he had a small bowel movement yesterday.  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  Consults  :  None  Procedures  :  None  ABG: No results found for: PHART, PCO2ART, PO2ART, HCO3, TCO2, ACIDBASEDEF, O2SAT  Vent Settings: N/A  Condition - Stable  Family Communication  : Long conversation with daughter on 12/1.  Code Status :  Full Code  Diet :  Diet Order            Diet heart  healthy/carb modified Room service appropriate? Yes; Fluid consistency: Thin  Diet effective now               Disposition Plan  :  Remain hospitalized-SNF on discharge  Barriers to discharge: Hypoxia requiring O2 supplementation/A. fib with RVR  Antimicorbials  :    Anti-infectives (From admission, onward)   Start     Dose/Rate Route Frequency Ordered Stop   06/03/19 1000  remdesivir 100 mg in sodium chloride  0.9 % 250 mL IVPB     100 mg 500 mL/hr over 30 Minutes Intravenous Every 24 hours 06/02/19 0511 06/05/19 1129   06/02/19 1600  remdesivir 100 mg in sodium chloride 0.9 % 250 mL IVPB     100 mg 500 mL/hr over 30 Minutes Intravenous Every 24 hours 06/02/19 0511 06/02/19 1657      Inpatient Medications  Scheduled Meds:  albuterol  2 puff Inhalation Q6H   allopurinol  100 mg Oral Daily   atorvastatin  20 mg Oral q1800   Chlorhexidine Gluconate Cloth  6 each Topical Daily   dabigatran  150 mg Oral BID   finasteride  5 mg Oral Daily   fluticasone furoate-vilanterol  1 puff Inhalation Daily   insulin aspart  0-15 Units Subcutaneous TID WC   insulin aspart  2-5 Units Subcutaneous QHS   insulin glargine  15 Units Subcutaneous QHS   midodrine  2.5 mg Oral BID WC   pantoprazole  40 mg Oral BID AC   polyethylene glycol  17 g Oral TID   senna  2 tablet Oral QHS   sucralfate  1 g Oral TID WC & HS   tamsulosin  0.4 mg Oral QHS   umeclidinium bromide  1 puff Inhalation Daily   Continuous Infusions:  amiodarone 60 mg/hr (06/19/19 1421)   Followed by   amiodarone     PRN Meds:.acetaminophen, alum & mag hydroxide-simeth, bisacodyl, chlorpheniramine-HYDROcodone, cyclobenzaprine, diphenhydrAMINE, guaiFENesin-dextromethorphan, lactulose, ondansetron **OR** ondansetron (ZOFRAN) IV, promethazine, sodium chloride   Time Spent in minutes  35  See all Orders from today for further details   Oren Binet M.D on 06/19/2019 at 2:57 PM  To page go to  [email or web address] - use universal password  [facility] -  Office  [phone]    Objective:   Vitals:   06/19/19 1429 06/19/19 1435 06/19/19 1437 06/19/19 1445  BP: 101/69  112/75 96/70  Pulse: 81 80 71 71  Resp: (!) 21 (!) 23 (!) 23 (!) 23  Temp:      TempSrc:      SpO2: 95% 94% 96% (!) 87%  Weight:      Height:        Wt Readings from Last 3 Encounters:  06/02/19 85.8 kg  06/01/19 84.5 kg  12/29/17 87.3 kg     Intake/Output Summary (Last 24 hours) at 06/19/2019 1457 Last data filed at 06/19/2019 1421 Gross per 24 hour  Intake 581.67 ml  Output 2295 ml  Net -1713.33 ml     Physical Exam Gen Exam:Alert awake-not in any distress HEENT:atraumatic, normocephalic Chest: B/L clear to auscultation anteriorly CVS:S1S2 regular Abdomen:soft non tender, non distended Extremities:no edema Neurology: Non focal Skin: no rash   Data Review:    CBC Recent Labs  Lab 06/15/19 1004 06/19/19 0341  WBC 11.8* 6.7  HGB 14.9 12.7*  HCT 43.8 38.5*  PLT 188 109*  MCV 95.2 94.8  MCH 32.4 31.3  MCHC 34.0 33.0  RDW 14.6 14.8    Chemistries  Recent Labs  Lab 06/14/19 0220 06/15/19 1004 06/16/19 0836 06/18/19 1645 06/19/19 0341  NA 133* 132* 133* 131* 135  K 4.4 4.4 4.4 4.0 4.1  CL 97* 96* 95* 98 102  CO2 _0 GLUCOSE 126* 150* 107* 304* 107*  BUN 53* 42* 33* 26* 21  CREATININE 1.59* 1.68* 1.57* 1.42* 1.23  CALCIUM 8.3* 7.8* 7.9* 7.5* 8.1*  AST  --  32  --  26 23  ALT  --  59*  --  31 30  ALKPHOS  --  33*  --  30* 28*  BILITOT  --  1.7*  --  1.3* 0.8   ------------------------------------------------------------------------------------------------------------------ No results for input(s): CHOL, HDL, LDLCALC, TRIG, CHOLHDL, LDLDIRECT in the last 72 hours.  Lab Results  Component Value Date   HGBA1C 7.5 (H) 05/31/2019   ------------------------------------------------------------------------------------------------------------------ No  results for input(s): TSH, T4TOTAL, T3FREE, THYROIDAB in the last 72 hours.  Invalid input(s): FREET3 ------------------------------------------------------------------------------------------------------------------ No results for input(s): VITAMINB12, FOLATE, FERRITIN, TIBC, IRON, RETICCTPCT in the last 72 hours.  Coagulation profile No results for input(s): INR, PROTIME in the last 168 hours.  No results for input(s): DDIMER in the last 72 hours.  Cardiac Enzymes No results for input(s): CKMB, TROPONINI, MYOGLOBIN in the last 168 hours.  Invalid input(s): CK ------------------------------------------------------------------------------------------------------------------    Component Value Date/Time   BNP 133.1 (H) 06/19/2019 0341    Micro Results No results found for this or any previous visit (from the past 240 hour(s)).  Radiology Reports Ct Abdomen Pelvis Wo Contrast  Result Date: 06/17/2019 CLINICAL DATA:  Abdominal distension. EXAM: CT ABDOMEN AND PELVIS WITHOUT CONTRAST TECHNIQUE: Multidetector CT imaging of the abdomen and pelvis was performed following the standard protocol without IV contrast. COMPARISON:  Chest x-ray May 31, 2019. CT abdomen and pelvis May 31, 2019. FINDINGS: Lower chest: There are patchy infiltrates in the bases and right middle lobe which are new in the interval, worrisome for an infectious process. Atypical infections should be considered. Lung bases are otherwise normal. Lower chest is otherwise normal. Hepatobiliary: A low-attenuation lesion in the right hepatic lobe on series 2, image 17 is stable and remains too small to characterize. Statistically, this is most likely a small benign lesion such as a cyst. No other liver abnormalities are identified. The gallbladder is normal. The portal veins not assessed due to lack of contrast. Pancreas: Unremarkable. No pancreatic ductal dilatation or surrounding inflammatory changes. Spleen: Normal in  size without focal abnormality. Adrenals/Urinary Tract: Bilateral perinephric stranding is stable and likely nonacute. The kidneys and ureters are otherwise normal. There is a Foley catheter in the bladder. The bladder is partially decompressed. There is air in the bladder is well, likely due to the Foley catheter. The fluid in the bladder demonstrates an attenuation of 49 Hounsfield units. Stomach/Bowel: Stomach is within normal limits. Appendix appears normal. No evidence of bowel wall thickening, distention, or inflammatory changes. Vascular/Lymphatic: Calcified atherosclerosis is seen in the nonaneurysmal aorta. No adenopathy. Reproductive: Prostate is unremarkable. Other: No abdominal wall hernia or abnormality. No abdominopelvic ascites. Musculoskeletal: No acute or significant osseous findings. IMPRESSION: 1. Patchy infiltrates in the lung bases and right middle lobe are new in the interval, worrisome for an infectious process. Atypical infections should be considered. 2. The bladder is partially decompressed with a Foley catheter. The fluid in the bladder demonstrates an attenuation of 49 Hounsfield units. This could represent blood products. Recommend clinical correlation. 3. Atherosclerosis in the nonaneurysmal aorta. Aortic Atherosclerosis (ICD10-I70.0). Electronically Signed   By: Dorise Bullion III M.D   On: 06/17/2019 11:22   Ct Abdomen Pelvis Wo Contrast  Result Date: 05/31/2019 CLINICAL DATA:  Weight loss, unintended, non-localized Abdominal pain, nausea vomiting and diarrhea. Shortness of breath. Fevers and chills over the last 2 days. Hypoxia EXAM: CT ABDOMEN AND PELVIS WITHOUT CONTRAST TECHNIQUE: Multidetector CT imaging of the abdomen and pelvis was performed following the standard protocol without IV contrast. COMPARISON:  CT of the  abdomen and pelvis 11/21/2017 FINDINGS: Lower chest: Scarring of the lingula and right middle lobe is stable. Minimal dependent atelectasis is present. Heart  size is normal. Hepatobiliary: There is diffuse fatty infiltration of the liver. No discrete lesions are present. The common bile duct and gallbladder are normal. Pancreas: Unremarkable. No pancreatic ductal dilatation or surrounding inflammatory changes. Spleen: Normal in size without focal abnormality. Adrenals/Urinary Tract: Adrenal glands are normal bilaterally. Kidneys and ureters are within normal limits. There is no stone or mass lesion. No obstruction is present. The urinary bladder is distended. No focal abnormality is present. Stomach/Bowel: The stomach and duodenum are within normal limits. Small bowel is unremarkable. Terminal ileum is within normal limits. Appendix is visualized and normal. The ascending and transverse colon are normal. The descending and sigmoid colon are within normal limits. Vascular/Lymphatic: Atherosclerotic calcifications are present in the aorta and branch vessels 50% narrowing is present at the origin of the SMA. Moderate stenosis is present at the origin of the IMA. Dense atherosclerotic calcifications are present in the proximal iliac arteries bilaterally. Reproductive: Prostate is unremarkable. Other: No abdominal wall hernia or abnormality. No abdominopelvic ascites. Musculoskeletal: Advanced degenerative changes are present in the lower lumbar spine. No acute abnormalities are present. Bony pelvis is within normal limits. The hips are located and within normal limits. IMPRESSION: 1. No acute or focal lesion to explain the patient's symptoms. 2. Hepatic steatosis. 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 4. Aortic Atherosclerosis (ICD10-I70.0). Electronically Signed   By: San Morelle M.D.   On: 05/31/2019 07:11   Dg Abd 1 View  Result Date: 06/08/2019 CLINICAL DATA:  Constipation EXAM: ABDOMEN - 1 VIEW COMPARISON:  CT abdomen pelvis-05/31/2019; chest radiograph-earlier same day FINDINGS: Moderate gaseous  distention of the colon without upstream distension of the small bowel. Unremarkable colonic stool burden. Nondiagnostic evaluation for pneumoperitoneum secondary to supine positioning and exclusion of the lower thorax. No pneumatosis or portal venous gas. No definitive abnormal intra-abdominal calcifications. Degenerative change the lower lumbar spine and bilateral hips is suspected though incompletely evaluated. IMPRESSION: 1. Mild gaseous distention of the colon without evidence of enteric obstruction. 2. Unremarkable colonic stool burden. Electronically Signed   By: Sandi Mariscal M.D.   On: 06/08/2019 08:33   Nm Pulmonary Perf And Vent  Result Date: 06/01/2019 CLINICAL DATA:  Hypoxemia, shortness of breath since Tuesday, COPD, asthma, question pulmonary embolism EXAM: NUCLEAR MEDICINE VENTILATION - PERFUSION LUNG SCAN TECHNIQUE: Ventilation images were obtained in multiple projections using inhaled aerosol Tc-66mDTPA. Perfusion images were obtained in multiple projections after intravenous injection of Tc-948mAA. RADIOPHARMACEUTICALS:  31.98 mCi of Tc-9929mPA aerosol inhalation and 4.12 mCi Tc99m39m IV COMPARISON:  None Correlation: Chest radiograph 05/31/2019 FINDINGS: Ventilation: Overall did irregular diminished ventilation within the upper lobes. Focal ventilatory defect lateral aspect RIGHT upper lobe. Prominent LEFT hilum. Perfusion: Matching perfusion defect lateral RIGHT upper lobe. Remaining perfusion normal. No additional segmental or subsegmental perfusion defects. Chest radiograph: Scattered interstitial prominence in the periphery of the lungs bilaterally greater in RIGHT upper lobe at site of ventilation and perfusion abnormality. IMPRESSION: Low probability for pulmonary embolism. Electronically Signed   By: MarkLavonia Dana.   On: 06/01/2019 08:21   Dg Chest Port Am  Result Date: 06/19/2019 CLINICAL DATA:  Shortness of breath EXAM: PORTABLE CHEST 1 VIEW COMPARISON:  May 31, 2019.  FINDINGS: There is cardiomegaly with pulmonary venous hypertension. There is diffuse interstitial  prominence in the periphery of each lung, progressed from recent study. There is no frank airspace consolidation. There are small pleural effusions bilaterally. There is aortic atherosclerosis. No adenopathy. Bones appear osteoporotic. IMPRESSION: Cardiomegaly with pulmonary vascular congestion. Small pleural effusions bilaterally. Diffuse interstitial prominence in the periphery of each lung, likely interstitial edema superimposed on underlying chronic fibrosis. There is felt to likely be a degree of congestive heart failure superimposed on fibrosis. No frank airspace consolidation. Aortic Atherosclerosis (ICD10-I70.0). Electronically Signed   By: Lowella Grip III M.D.   On: 06/19/2019 07:59   Dg Chest Portable 1 View  Result Date: 05/31/2019 CLINICAL DATA:  Shortness of breath EXAM: PORTABLE CHEST 1 VIEW COMPARISON:  January 05, 2016 FINDINGS: The heart size and mediastinal contours are within normal limits. Aortic knob calcifications are seen. There is minimally increased interstitial markings seen at the periphery. There is bibasilar subsegmental atelectasis. No large airspace consolidation or pleural effusion. No acute osseous abnormality. IMPRESSION: Mildly increased interstitial markings which could be due to interstitial edema. Electronically Signed   By: Prudencio Pair M.D.   On: 05/31/2019 03:52   Dg Abd Portable 2v  Result Date: 06/15/2019 CLINICAL DATA:  Vomiting. EXAM: PORTABLE ABDOMEN - 2 VIEW COMPARISON:  Abdominal radiograph dated 06/08/2019 and chest x-ray dated 05/31/2019 FINDINGS: The bowel gas pattern is normal. No dilated loops of large or small bowel. No free air in the abdomen. No acute bone abnormality. The patient appears to have hazy peripheral infiltrates at the lung bases which appear to be new since 05/31/2019. IMPRESSION: 1. Benign-appearing abdomen. 2. There appear to be new  peripheral infiltrates at the left and right lung bases. Electronically Signed   By: Lorriane Shire M.D.   On: 06/15/2019 09:31

## 2019-06-20 ENCOUNTER — Inpatient Hospital Stay
Admission: AD | Admit: 2019-06-20 | Payer: Medicare HMO | Source: Other Acute Inpatient Hospital | Admitting: Internal Medicine

## 2019-06-20 DIAGNOSIS — R0602 Shortness of breath: Secondary | ICD-10-CM

## 2019-06-20 DIAGNOSIS — R0600 Dyspnea, unspecified: Secondary | ICD-10-CM

## 2019-06-20 LAB — CBC
HCT: 39.9 % (ref 39.0–52.0)
Hemoglobin: 13 g/dL (ref 13.0–17.0)
MCH: 31.4 pg (ref 26.0–34.0)
MCHC: 32.6 g/dL (ref 30.0–36.0)
MCV: 96.4 fL (ref 80.0–100.0)
Platelets: 99 10*3/uL — ABNORMAL LOW (ref 150–400)
RBC: 4.14 MIL/uL — ABNORMAL LOW (ref 4.22–5.81)
RDW: 15 % (ref 11.5–15.5)
WBC: 7.3 10*3/uL (ref 4.0–10.5)
nRBC: 0 % (ref 0.0–0.2)

## 2019-06-20 LAB — GLUCOSE, CAPILLARY
Glucose-Capillary: 143 mg/dL — ABNORMAL HIGH (ref 70–99)
Glucose-Capillary: 247 mg/dL — ABNORMAL HIGH (ref 70–99)
Glucose-Capillary: 325 mg/dL — ABNORMAL HIGH (ref 70–99)
Glucose-Capillary: 406 mg/dL — ABNORMAL HIGH (ref 70–99)

## 2019-06-20 LAB — COMPREHENSIVE METABOLIC PANEL
ALT: 28 U/L (ref 0–44)
AST: 22 U/L (ref 15–41)
Albumin: 2.4 g/dL — ABNORMAL LOW (ref 3.5–5.0)
Alkaline Phosphatase: 32 U/L — ABNORMAL LOW (ref 38–126)
Anion gap: 12 (ref 5–15)
BUN: 19 mg/dL (ref 8–23)
CO2: 25 mmol/L (ref 22–32)
Calcium: 8.1 mg/dL — ABNORMAL LOW (ref 8.9–10.3)
Chloride: 98 mmol/L (ref 98–111)
Creatinine, Ser: 1.28 mg/dL — ABNORMAL HIGH (ref 0.61–1.24)
GFR calc Af Amer: >60 mL/min (ref >60)
GFR calc non Af Amer: 54 mL/min — ABNORMAL LOW (ref >60)
Glucose, Bld: 95 mg/dL (ref 70–99)
Potassium: 4 mmol/L (ref 3.5–5.1)
Sodium: 135 mmol/L (ref 135–145)
Total Bilirubin: 1.1 mg/dL (ref 0.3–1.2)
Total Protein: 5.6 g/dL — ABNORMAL LOW (ref 6.5–8.1)

## 2019-06-20 MED ORDER — PANTOPRAZOLE SODIUM 40 MG PO TBEC: 40.0000 mg | DELAYED_RELEASE_TABLET | Freq: Two times a day (BID) | ORAL | Status: DC

## 2019-06-20 MED ORDER — SUCRALFATE 1 GM/10ML PO SUSP: 1.0000 g | Freq: Three times a day (TID) | ORAL | 0 refills | Status: DC

## 2019-06-20 MED ORDER — AMIODARONE HCL IN DEXTROSE 360-4.14 MG/200ML-% IV SOLN: 30.0000 mg/h | INTRAVENOUS | Status: DC

## 2019-06-20 MED ORDER — INSULIN ASPART 100 UNIT/ML ~~LOC~~ SOLN: 0.0000 [IU] | Freq: Three times a day (TID) | SUBCUTANEOUS | 11 refills | Status: DC

## 2019-06-20 MED ORDER — METOPROLOL TARTRATE 25 MG PO TABS: 25.0000 mg | ORAL_TABLET | Freq: Four times a day (QID) | ORAL | Status: DC

## 2019-06-20 MED ORDER — METOPROLOL TARTRATE 5 MG/5ML IV SOLN
5.0000 mg | INTRAVENOUS | Status: DC | PRN
  Administered 2019-06-20 – 2019-06-21 (×3): 5 mg via INTRAVENOUS
  Filled 2019-06-20 (×4): qty 5

## 2019-06-20 MED ORDER — INSULIN ASPART 100 UNIT/ML ~~LOC~~ SOLN
3.0000 [IU] | Freq: Once | SUBCUTANEOUS | Status: AC
  Administered 2019-06-20: 3 [IU] via SUBCUTANEOUS

## 2019-06-20 MED ORDER — INSULIN GLARGINE 100 UNIT/ML ~~LOC~~ SOLN: 15.0000 [IU] | Freq: Every day | SUBCUTANEOUS | 11 refills | Status: DC

## 2019-06-20 MED ORDER — METOPROLOL TARTRATE 5 MG/5ML IV SOLN: 5.0000 mg | INTRAVENOUS | Status: DC | PRN

## 2019-06-20 MED ORDER — ALBUTEROL SULFATE HFA 108 (90 BASE) MCG/ACT IN AERS: 2.0000 | INHALATION_SPRAY | Freq: Four times a day (QID) | RESPIRATORY_TRACT | Status: DC

## 2019-06-20 MED ORDER — LACTULOSE 10 GM/15ML PO SOLN: 30.0000 g | Freq: Three times a day (TID) | ORAL | 0 refills | Status: DC | PRN

## 2019-06-20 MED ORDER — SENNA 8.6 MG PO TABS: 2.0000 | ORAL_TABLET | Freq: Every day | ORAL | 0 refills | Status: DC

## 2019-06-20 MED ORDER — DILTIAZEM HCL-DEXTROSE 125-5 MG/125ML-% IV SOLN (PREMIX): 5.0000 mg/h | INTRAVENOUS | Status: DC

## 2019-06-20 MED ORDER — FINASTERIDE 5 MG PO TABS: 5.0000 mg | ORAL_TABLET | Freq: Every day | ORAL | Status: DC

## 2019-06-20 MED ORDER — ALUM & MAG HYDROXIDE-SIMETH 200-200-20 MG/5ML PO SUSP: 15.0000 mL | Freq: Four times a day (QID) | ORAL | 0 refills | Status: DC | PRN

## 2019-06-20 MED ORDER — METOPROLOL TARTRATE 25 MG PO TABS
25.0000 mg | ORAL_TABLET | Freq: Four times a day (QID) | ORAL | Status: DC
  Administered 2019-06-20 – 2019-06-21 (×2): 25 mg via ORAL
  Filled 2019-06-20: qty 1

## 2019-06-20 MED ORDER — BISACODYL 10 MG RE SUPP: 10.0000 mg | Freq: Every day | RECTAL | 0 refills | Status: DC | PRN

## 2019-06-20 MED ORDER — ACETAMINOPHEN 325 MG PO TABS: 650.0000 mg | ORAL_TABLET | Freq: Four times a day (QID) | ORAL | Status: DC | PRN

## 2019-06-20 MED ORDER — POLYETHYLENE GLYCOL 3350 17 G PO PACK: 17.0000 g | PACK | Freq: Three times a day (TID) | ORAL | 0 refills | Status: DC

## 2019-06-20 MED ORDER — MIDODRINE HCL 2.5 MG PO TABS: 2.5000 mg | ORAL_TABLET | Freq: Two times a day (BID) | ORAL | Status: DC

## 2019-06-20 MED ORDER — DILTIAZEM HCL-DEXTROSE 125-5 MG/125ML-% IV SOLN (PREMIX)
5.0000 mg/h | INTRAVENOUS | Status: DC
  Administered 2019-06-20: 5 mg/h via INTRAVENOUS
  Filled 2019-06-20: qty 125

## 2019-06-20 MED ORDER — DILTIAZEM LOAD VIA INFUSION
10.0000 mg | Freq: Once | INTRAVENOUS | Status: AC
  Administered 2019-06-20: 10 mg via INTRAVENOUS
  Filled 2019-06-20: qty 10

## 2019-06-20 MED ORDER — FUROSEMIDE 10 MG/ML IJ SOLN
40.0000 mg | Freq: Every day | INTRAMUSCULAR | Status: DC
  Administered 2019-06-20: 40 mg via INTRAVENOUS
  Filled 2019-06-20: qty 4

## 2019-06-20 MED ORDER — METOPROLOL TARTRATE 5 MG/5ML IV SOLN
2.5000 mg | INTRAVENOUS | Status: AC
  Administered 2019-06-20: 2.5 mg via INTRAVENOUS

## 2019-06-20 NOTE — Progress Notes (Signed)
Now on 15 mg of Cardizem gtt and amiodarone infusion-heart rate still sustaining in the 130s-140s range.  Spoke with cardiology on-call Dr. Retia Passe discussed in detail-recommends addition of oral Lopressor.  He will evaluate patient and see if other changes needs to be done.

## 2019-06-20 NOTE — Progress Notes (Signed)
Transferred pt to chair HR increased to 120 sats drop to 80%. Increased o2, pt recovered after a few min

## 2019-06-20 NOTE — Plan of Care (Signed)
  Problem: Education: Goal: Knowledge of risk factors and measures for prevention of condition will improve Outcome: Progressing   Problem: Respiratory: Goal: Will maintain a patent airway Outcome: Progressing Goal: Complications related to the disease process, condition or treatment will be avoided or minimized Outcome: Progressing   Problem: Education: Goal: Knowledge of risk factors and measures for prevention of condition will improve Outcome: Progressing   Problem: Respiratory: Goal: Will maintain a patent airway Outcome: Progressing Goal: Complications related to the disease process, condition or treatment will be avoided or minimized Outcome: Progressing   Problem: Education: Goal: Knowledge of General Education information will improve Description: Including pain rating scale, medication(s)/side effects and non-pharmacologic comfort measures Outcome: Progressing   Problem: Coping: Goal: Psychosocial and spiritual needs will be supported Outcome: Progressing   Problem: Clinical Measurements: Goal: Diagnostic test results will improve Outcome: Progressing   Problem: Clinical Measurements: Goal: Ability to maintain clinical measurements within normal limits will improve Outcome: Progressing Goal: Will remain free from infection Outcome: Progressing Goal: Diagnostic test results will improve Outcome: Progressing Goal: Respiratory complications will improve Outcome: Progressing Goal: Cardiovascular complication will be avoided Outcome: Progressing   Problem: Clinical Measurements: Goal: Ability to maintain clinical measurements within normal limits will improve Outcome: Progressing Goal: Will remain free from infection Outcome: Progressing Goal: Diagnostic test results will improve Outcome: Progressing Goal: Respiratory complications will improve Outcome: Progressing Goal: Cardiovascular complication will be avoided Outcome: Progressing   Problem:  Coping: Goal: Level of anxiety will decrease Outcome: Progressing   Problem: Elimination: Goal: Will not experience complications related to bowel motility Outcome: Progressing Goal: Will not experience complications related to urinary retention Outcome: Progressing   Problem: Education: Goal: Knowledge of General Education information will improve Description: Including pain rating scale, medication(s)/side effects and non-pharmacologic comfort measures Outcome: Progressing   Problem: Skin Integrity: Goal: Risk for impaired skin integrity will decrease Outcome: Progressing   Problem: Safety: Goal: Ability to remain free from injury will improve Outcome: Progressing   Problem: Health Behavior/Discharge Planning: Goal: Ability to manage health-related needs will improve Outcome: Progressing   Problem: Clinical Measurements: Goal: Ability to maintain clinical measurements within normal limits will improve Outcome: Progressing Goal: Will remain free from infection Outcome: Progressing Goal: Diagnostic test results will improve Outcome: Progressing Goal: Respiratory complications will improve Outcome: Progressing Goal: Cardiovascular complication will be avoided Outcome: Progressing   Problem: Activity: Goal: Risk for activity intolerance will decrease Outcome: Progressing   Problem: Nutrition: Goal: Adequate nutrition will be maintained Outcome: Progressing   Problem: Coping: Goal: Level of anxiety will decrease Outcome: Progressing   Problem: Skin Integrity: Goal: Risk for impaired skin integrity will decrease Outcome: Progressing   Problem: Safety: Goal: Ability to remain free from injury will improve Outcome: Progressing   Problem: Pain Managment: Goal: General experience of comfort will improve Outcome: Progressing   Problem: Elimination: Goal: Will not experience complications related to bowel motility Outcome: Progressing

## 2019-06-20 NOTE — Progress Notes (Addendum)
06/20/19 1513  Vitals  BP 106/70  Pulse Rate (!) 140  ECG Heart Rate (!) 141  updated MD Ghimire. HR not sustaining no new orders

## 2019-06-20 NOTE — Progress Notes (Addendum)
Called daughter to update on father status. Daughter is concerned that patient is getting depressed and wants pt transferred back to [facility] and not SNF and demands facetime call with dad. Nurse explained I can not stop providing patient care right now but will be happy to do so later in the shift when I have down time. Explained she can call his personal phone or phone in the room at the moment. explained that MD is aware that she wants a phone call. He was notified of this at 1000 and stated he would call her. Explained MD have to provide patient care to their patients before they make phone calls. Updated daughter that pt transferred to chair this morning and has been ambulating in room. Nurse notified MD again of daughters concerns. Daughter also states she is not getting update from night night past two nights. Patient has no concerns when ask by the nurse.

## 2019-06-20 NOTE — Progress Notes (Addendum)
1707 verbal order from MD Ghimire to increase diltiazem gtt to 7.37m/hr.  1815 HR sustaining 146 bmp increased diltiazem to 161mhr notified MD Ghimire . Verbal to give 1080mv push  diltiazem

## 2019-06-20 NOTE — Progress Notes (Addendum)
Notified MD Ghimire, HR sustaining 140's last 10 minutes. See new orders

## 2019-06-20 NOTE — Progress Notes (Signed)
   06/20/19 1430  Vitals  BP 105/84  MAP (mmHg) 93  Pulse Rate (!) 140  ECG Heart Rate (!) 139  Resp (!) 28  Oxygen Therapy  SpO2 95 %  MEWS Score  MEWS RR 2  MEWS Pulse 3  MEWS Systolic 0  MEWS LOC 0  MEWS Temp 0  MEWS Score 5  MEWS Score Color Red   Notified MD Ghimire. See new orders

## 2019-06-20 NOTE — Progress Notes (Signed)
Inpatient Diabetes Program Recommendations  AACE/ADA: New Consensus Statement on Inpatient Glycemic Control (2015)  Target Ranges:  Prepandial:   less than 140 mg/dL      Peak postprandial:   less than 180 mg/dL (1-2 hours)      Critically ill patients:  140 - 180 mg/dL   Results for Jay Morris, Jay Morris (MRN [id number]) as of 06/20/2019 13:48  Ref. Range 06/19/2019 07:28 06/19/2019 11:44 06/19/2019 16:03 06/19/2019 20:47  Glucose-Capillary Latest Ref Range: 70 - 99 mg/dL 81 110 (H) 325 (H)  11 units NOVOLOG  329 (H)  4 units NOVOLOG +  15 units LANTUS    Results for [name], [name] (MRN [id number]) as of 06/20/2019 13:48  Ref. Range 06/20/2019 07:42 06/20/2019 11:53  Glucose-Capillary Latest Ref Range: 70 - 99 mg/dL 143 (H)  2 units NOVOLOG  247 (H)  5 units NOVOLOG     Home DM Meds: Tresiba 120 units QHS Novolog 30 units TID Metformin 1000 mgBID   Current Orders: Lantus 15 units QHS       Novolog Moderate Correction Scale/ SSI (0-15 units) TID AC + HS       CBGs 300s yest at 5pm and bedtime.    Elevated at 12pm today.    MD- Please consider adding Novolog Meal Coverage:  Novolog 3 units TID with meals  (Please add the following Hold Parameters: Hold if pt eats <50% of meal, Hold if pt NPO)     --Will follow patient during hospitalization--  Wyn Quaker RN, MSN, CDE Diabetes Coordinator Inpatient Glycemic Control Team Team Pager: [phone] (8a-5p)

## 2019-06-20 NOTE — Progress Notes (Signed)
Reevaluation-heart rate mostly in the 120s-currently asymptomatic-O2 saturation in the mid 90s on just 2 L.  Sitting at bedside chair.  On both amiodarone and diltiazem infusion.  Awaiting bed at [facility].

## 2019-06-20 NOTE — Care Management Important Message (Signed)
Important Message  Patient Details  Name: Jay Morris MRN: [id number] Date of Birth: 01/08/1946   Medicare Important Message Given:  Yes - Important Message mailed due to current National Emergency  Verbal consent obtained due to current National Emergency  Relationship to patient: Spouse/Significant Other Contact Name: Juron Vorhees Call Date: 06/20/19  Time: 1533 Phone: [phone] Outcome: Spoke with contact Important Message mailed to: Patient address on file    Delorse Lek 06/20/2019, 3:34 PM

## 2019-06-20 NOTE — Progress Notes (Signed)
Physical Therapy Treatment Patient Details Name: Jay Morris MRN: [id number] DOB: Mar 22, 1946 Today's Date: 06/20/2019    History of Present Illness From MD H&P: Pt is a 73 y.o. male with a known history of asthma, atrial fibrillation, COPD, type 2 diabetes mellitus, hypertension and dyslipidemia, presented to the emergency room with acute onset of 2-day history of fever with a T-max of 103 and chills with associated body aching, generalized weakness, fatigue and malaise.  He admitted to dyspnea with paroxysmal nocturnal dyspnea and dyspnea on exertion without orthopnea or lower extremity edema.  He admitted to nausea and vomiting as well as watery diarrhea.  He admitted to mild epigastric and infraumbilical abdominal pain.  He lost about 10 pounds during the last 4 days.  He had mild dizziness without headache or diplopia.  Upon presentation to the emergency room, temperature was 99.3 respiratory to 25, heart rate 82, blood pressure 150/66 and pulse oximetry 91% on room air dropped to 82% upon ambulation for short distance.  CBC showed anemia with hemoglobin of 12.5 hematocrit of 37.1 better than his previous levels and very close to his baseline.  For which x-ray showed mild increase interstitial markings which could be due to interstitial pulmonary edema.  He will be admitted to a medical monitored bed for further evaluation and management.  MD assessment includes: Acute kidney injury superimposed on chronic kidney disease stage IIIa, N&V, diarrhea, DM II with hyperglycemia, A-fib, and general weakness; he was found to have acute hypoxic respiratory failure secondary to COVID-19 pneumonia.  Hospital course was complicated by progressively worsening hypoxemia requiring 15 L of high flow oxygen and A. fib with RVR.    PT Comments    Pt making progress with tx today, was able to tolerate more and also needed less 02 and less assist to complete. In intial tx pt was on 4L/min via HFNC and was able to  ambulate approx 35f with HHA x 1, desat to min of 94% with this. On later tx worked on breathing exercises and also BLE seated there ex and pt did very well was down to 3L/min via HFNC and managed to keep sats in 94-97% range denied any distress and none noted on face. Attempted to titrate pt to 2L/min via HFNC but pt desat to low 80s and had great difficulty bringing sats back up.   Follow Up Recommendations  SNF     Equipment Recommendations  Rolling walker with 5" wheels    Recommendations for Other Services       Precautions / Restrictions Precautions Precautions: Fall Restrictions Weight Bearing Restrictions: No    Mobility  Bed Mobility               General bed mobility comments: pt still sitting in recliner has finished with nursing staff  Transfers Overall transfer level: Needs assistance Equipment used: 1 person hand held assist Transfers: Sit to/from Stand Sit to Stand: Min assist         General transfer comment: set up, line management assist and min a to complete  Ambulation/Gait Ambulation/Gait assistance: Min assist Gait Distance (Feet): 46 Feet Assistive device: 1 person hand held assist Gait Pattern/deviations: Step-through pattern Gait velocity: very slow and cautious   General Gait Details: 4L/min [place]    Modified Rankin (Stroke Patients Only)       Balance Overall balance assessment: Needs assistance Sitting-balance support: Feet  unsupported Sitting balance-Leahy Scale: Good     Standing balance support: During functional activity;Single extremity supported Standing balance-Leahy Scale: Fair                              Cognition Arousal/Alertness: Awake/alert Behavior During Therapy: WFL for tasks assessed/performed Overall Cognitive Status: Within Functional Limits for tasks assessed                                        Exercises Total  Joint Exercises Ankle Circles/Pumps: AROM;Both;Strengthening;10 reps Hip ABduction/ADduction: AROM;Strengthening;Both;10 reps Knee Flexion: AROM;Strengthening;Both;10 reps General Exercises - Lower Extremity Hip Flexion/Marching: AROM;Strengthening;Both;10 reps(in seated position) Other Exercises Other Exercises: incentive spirometer x 5 pulls 1566m Other Exercises: flutter valve x 10    General Comments        Pertinent Vitals/Pain Pain Assessment: Faces Faces Pain Scale: No hurt    Home Living                      Prior Function            PT Goals (current goals can now be found in the care plan section) Acute Rehab PT Goals Patient Stated Goal: states is feeling much better and that it feels good to be walking again Time For Goal Achievement: 06/22/19 Potential to Achieve Goals: Fair Progress towards PT goals: Progressing toward goals    Frequency    Min 2X/week      PT Plan Current plan remains appropriate    Co-evaluation              AM-PAC PT "6 Clicks" Mobility   Outcome Measure  Help needed turning from your back to your side while in a flat bed without using bedrails?: A Little Help needed moving from lying on your back to sitting on the side of a flat bed without using bedrails?: A Little Help needed moving to and from a bed to a chair (including a wheelchair)?: A Little Help needed standing up from a chair using your arms (e.g., wheelchair or bedside chair)?: A Little Help needed to walk in hospital room?: A Little Help needed climbing 3-5 steps with a railing? : Total 6 Click Score: 16    End of Session Equipment Utilized During Treatment: Oxygen Activity Tolerance: Patient tolerated treatment well Patient left: in chair;with call bell/phone within reach Nurse Communication: Mobility status PT Visit Diagnosis: Difficulty in walking, not elsewhere classified (R26.2);Muscle weakness (generalized) (M62.81)    Time: 15790-7931PT  Time Calculation (min) (ACTUTE ONLY): 12 min Time: 1141-1155 PT Time Calculation (min) (ACUTE ONLY): 14 min  Charges:  $Gait Training: 8-22 mins $Therapeutic Exercise: 8-22 mins                     LHorald Chestnut PT    LDelford Field12/08/2018, 12:30 PM

## 2019-06-20 NOTE — Discharge Summary (Signed)
PATIENT DETAILS Name: Jay Morris Age: 73 y.o. Sex: male Date of Birth: 03-24-1946 MRN: [id number]. Admitting Physician: Jonetta Osgood, MD [id number], Leonie Douglas, MD  Admit Date: 06/02/2019 Discharge date: 06/20/2019  Recommendations for Outpatient Follow-up:  1. Needs a voiding trial (failed a voiding trial a few days ago-and hence Foley catheter reinserted) 2. Please notify cardiology (Dr. Nehemiah Massed) when patient arrives at [facility].  Admitted From:  Home>>[facility]>>[facility]  Disposition: [facility] at family's request   Home Health: No  Equipment/Devices: None-please assess for home O2 requirement on discharge from [facility].  Discharge Condition: Stable  CODE STATUS: FULL CODE  Diet recommendation:  Diet Order            Diet - low sodium heart healthy        Diet Carb Modified        Diet heart healthy/carb modified Room service appropriate? Yes; Fluid consistency: Thin  Diet effective now               Brief Summary: See H&P, Labs, Consult and Test reports for all details in brief, Patient is a 73 y.o. male with PMHx of COPD, bronchial asthma, PAF on Pradaxa, DM-2, HTN-who presented with fever, chills and worsening shortness of breath-he was found to have acute hypoxic respiratory failure secondary to COVID-19 pneumonia.  Hospital course was complicated by progressively worsening hypoxemia requiring 15 L of high flow oxygen and A. fib with RVR.  Patient was treated with steroids, remdesivir, convalescent plasma with slow clinical improvement.  Further hospital course lately has been complicated by acute urinary retention requiring Foley catheter insertion, constipation requiring laxative/enema and A. fib with RVR requiring amiodarone infusion.  Patient's heart rate is better controlled with amiodarone infusion-however family (patient's daughter) has specifically requested that the patient be transferred back to [facility] so that patient can be evaluated by patient's primary  cardiologist.  See below for further details.   Brief Hospital Course: Acute Hypoxic Resp Failure due to Covid 19 Viral pneumonia:  Improved significantly-he is down to 2 L of oxygen-at one point he was on 10-15 L of high flow oxygen.  He has completed a course of steroid and remdesivir.  When patient gets episodes of RVR-he temporarily requires high flow O2-but can be quickly titrated down.    COVID-19 Labs:  No results for input(s): DDIMER, FERRITIN, LDH, CRP in the last 72 hours.  Lab Results  Component Value Date   [place] NEGATIVE 05/31/2019     COVID-19 Medications: Steroids: 11/11>> 11/24 Remdesivir: 11/13>> 11/17 Actemra: Not given Convalescent Plasma: 11/15 x 1  Paroxysmal a flutter/A. fib with RVR: Has a longstanding history of atrial fibrillation-apparently is s/p ablation at [facility] approximately 6-7 years back.  Chronically maintained on Pradaxa.  He developed A. fib with RVR again on 11/30-he had mild hypotension and lightheadedness-hence was started on amiodarone infusion after discussion with cardiology.    However-amiodarone infusion was discontinued overnight as patient developed episodes of bradycardia and sinus pauses.  On 12/1-patient again had A. fib with RVR-Case was discussed with [facility] heart care-who recommended that we stop metoprolol-and amiodarone infusion.  After restarting amiodarone infusion-heart rate was better controlled-until this morning when patient lost his IV access-amiodarone had to be held for almost half an hour, following which-patient again redeveloped RVR.  Case was discussed with Dr. Kennon Holter [facility] heart care cardiology on-call-who recommended we use IV metoprolol as needed-and if heart rate still is uncontrolled-we could try low diltiazem drip.  Currently patient's heart rate is  in the 120s-with low-dose diltiazem drip at 5 mg an hour and amiodarone infusion.  Blood pressure and respirations are stable-he remains on just 2 L of oxygen.   Cardiology team-Dr. Caryl Bis [facility] is aware of transfer-and will formally consult when patient arrives at [facility].  Note-I spoke  with the physician assistant covering Dr. Alveria Apley practice through [place], they will consult on the patient once the patient arrives to [facility].  Note recent echo on 11/12-showed preserved EF.  Decompensated diastolic heart failure: Likely secondary to RVR-causing temporary increase in oxygen requirements-being treated with IV Lasix.  While in RVR-he sometimes becomes symptomatic and requires up to 10 L of high flow oxygen-once his rate is better controlled-his FiO2 is quickly titrated down to 2 L.  There is no evidence of volume overload on exam.  He has history of orthostatic hypotension, and his blood pressures are soft-therefore caution will need to be advised when using diuretics.  Abdominal pain: Suspect either from GERD/dyspepsia and constipation.  Over the past few days he has not had any abdominal pain-and has been having daily bowel movements.  He is maintained on MiraLAX and Senokot.  He occasionally still requires as needed lactulose and Dulcolax suppository as well.  His last bowel movement was 12/2.  He is also being continued on PPI and Carafate.  Acute urinary retention: Developed on 11/27-with discomfort and inability to urinate-bladder scan showed more than 1000 mL-Foley catheter placed.  Flomax was increased to 0.8 mg-finasteride was added.  Voiding trial on 11/29 was unsuccessful-Foley catheter was reinserted on 12/1-plans are for a voiding trial problem the next few weeks in the outpatient setting.  However given his orthostatic hypotension and soft blood pressure-Flomax was decreased back to 0.4 mg.  Patient will need outpatient follow-up with urology.   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  He also is on diuretics,  beta-blocker and Flomax which are not helping.  Have started him on low-dose midodrine.     DM-2: CBGs fluctuating but relatively stable-continue Lantus 15 units nightly and SSI.  AKI on CKD stage IIIa: AKI improved-creatinine back to baseline-follow periodically  HTN: Blood pressure controlled-stopping metoprolol-in order to allow moderate control with amiodarone.  Constipation: Remains on maintenance MiraLAX/senna/as needed lactulose, Dulcolax.  Per patient-he had a small bowel movement yesterday.  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  Family communication: Spoke with daughter again today-apparently the patient has been telling the family that he is very anxious and feels that he is not getting any better.  I have provided reassurance to the family over the past few days.  However-this morning-patient's daughter told me that family is very concerned-and since that patient has improved from Covid pneumonia/respiratory failure point of view-family would like the patient to be transferred back to [facility] where he can be seen by his primary cardiologist.  In fact, the patient's daughter called the cardiologist office this morning with this request as well.  As noted above-I have spoken with the PA-see covering Dr. Nehemiah Massed this morning through [place].    Procedures/Studies: 05/31/19>>  1. Left ventricular ejection fraction, by visual estimation, is 60 to 65%. The left ventricle has normal  function. Left ventricular septal wall thickness was mildly increased. Mildly increased left ventricular posterior wall thickness. There is mildly  increased left ventricular hypertrophy.  2. Global right ventricle has  normal systolic function.The right ventricular size is mildly enlarged. No increase in right ventricular wall thickness.  3. Left atrial size was normal.  4. Right atrial size was mildly dilated.  5. The mitral valve was not well visualized. Trace mitral valve regurgitation.  6. The tricuspid valve is not well visualized. Tricuspid valve regurgitation is mild.  7. The aortic valve is grossly normal. Aortic valve regurgitation is trivial.  8. The pulmonic valve was not well visualized. Pulmonic valve regurgitation is trivial.  9. The aortic root was not well visualized. 10. Mildly elevated pulmonary artery systolic pressure. 11. The atrial septum is grossly normal.  Discharge Diagnoses:  Principal Problem:   COVID-19 virus infection Active Problems:   Uncontrolled type 2 diabetes mellitus with hyperglycemia, with long-term current use of insulin (HCC)   Paroxysmal atrial fibrillation with RVR (HCC)   Hyperlipidemia   Degeneration of lumbar intervertebral disc   COPD, moderate (HCC)   Acute respiratory failure with hypoxia (HCC)   Acute renal failure superimposed on stage 3a chronic kidney disease (HCC)   COPD with acute exacerbation (HCC)   PVD (peripheral vascular disease) ([place])   Hepatic steatosis   Discharge Instructions:    Person Under Monitoring Name: Jay Morris  Location: [address] [place] [address]   Infection Prevention Recommendations for Individuals Confirmed to have, or Being Evaluated for, 2019 Novel Coronavirus (COVID-19) Infection Who Receive Care at Home  Individuals who are confirmed to have, or are being evaluated for, COVID-19 should follow the prevention steps below until a healthcare provider or local or state health department says they can return to normal activities.  Stay home except to get medical care You should restrict activities outside your home, except for getting medical care. Do not go to work, school, or public areas, and do  not use public transportation or taxis.  Call ahead before visiting your doctor Before your medical appointment, call the healthcare provider and tell them that you have, or are being evaluated for, COVID-19 infection. This will help the healthcare providers office take steps to keep other people from getting infected. Ask your healthcare provider to call the local or state health department.  Monitor your symptoms Seek prompt medical attention if your illness is worsening (e.g., difficulty breathing). Before going to your medical appointment, call the healthcare provider and tell them that you have, or are being evaluated for, COVID-19 infection. Ask your healthcare provider to call the local or state health department.  Wear a facemask You should wear a facemask that covers your nose and mouth when you are in the same room with other people and when you visit a healthcare provider. People who live with or visit you should also wear a facemask while they are in the same room with you.  Separate yourself from other people in your home As much as possible, you should stay in a different room from other people in your home. Also, you should use a separate bathroom, if available.  Avoid sharing household items You should not share dishes, drinking glasses, cups, eating utensils, towels, bedding, or other items with other people in your home. After using these items, you should wash them thoroughly with soap and water.  Cover your coughs and sneezes Cover your mouth and nose with a tissue when you cough  or sneeze, or you can cough or sneeze into your sleeve. Throw used tissues in a lined trash can, and immediately wash your hands with soap and water for at least 20 seconds or use an alcohol-based hand rub.  Wash your [facility] your hands often and thoroughly with soap and water for at least 20 seconds. You can use an alcohol-based hand sanitizer if soap and water are not available and  if your hands are not visibly dirty. Avoid touching your eyes, nose, and mouth with unwashed hands.   Prevention Steps for Caregivers and Household Members of Individuals Confirmed to have, or Being Evaluated for, COVID-19 Infection Being Cared for in the Home  If you live with, or provide care at home for, a person confirmed to have, or being evaluated for, COVID-19 infection please follow these guidelines to prevent infection:  Follow healthcare providers instructions Make sure that you understand and can help the patient follow any healthcare provider instructions for all care.  Provide for the patients basic needs You should help the patient with basic needs in the home and provide support for getting groceries, prescriptions, and other personal needs.  Monitor the patients symptoms If they are getting sicker, call his or her medical provider and tell them that the patient has, or is being evaluated for, COVID-19 infection. This will help the healthcare providers office take steps to keep other people from getting infected. Ask the healthcare provider to call the local or state health department.  Limit the number of people who have contact with the patient  If possible, have only one caregiver for the patient.  Other household members should stay in another home or place of residence. If this is not possible, they should stay  in another room, or be separated from the patient as much as possible. Use a separate bathroom, if available.  Restrict visitors who do not have an essential need to be in the home.  Keep older adults, very young children, and other sick people away from the patient Keep older adults, very young children, and those who have compromised immune systems or chronic health conditions away from the patient. This includes people with chronic heart, lung, or kidney conditions, diabetes, and cancer.  Ensure good ventilation Make sure that shared spaces in  the home have good air flow, such as from an air conditioner or an opened window, weather permitting.  Wash your hands often  Wash your hands often and thoroughly with soap and water for at least 20 seconds. You can use an alcohol based hand sanitizer if soap and water are not available and if your hands are not visibly dirty.  Avoid touching your eyes, nose, and mouth with unwashed hands.  Use disposable paper towels to dry your hands. If not available, use dedicated cloth towels and replace them when they become wet.  Wear a facemask and gloves  Wear a disposable facemask at all times in the room and gloves when you touch or have contact with the patients blood, body fluids, and/or secretions or excretions, such as sweat, saliva, sputum, nasal mucus, vomit, urine, or feces.  Ensure the mask fits over your nose and mouth tightly, and do not touch it during use.  Throw out disposable facemasks and gloves after using them. Do not reuse.  Wash your hands immediately after removing your facemask and gloves.  If your personal clothing becomes contaminated, carefully remove clothing and launder. Wash your hands after handling contaminated clothing.  Place all  used disposable facemasks, gloves, and other waste in a lined container before disposing them with other household waste.  Remove gloves and wash your hands immediately after handling these items.  Do not share dishes, glasses, or other household items with the patient  Avoid sharing household items. You should not share dishes, drinking glasses, cups, eating utensils, towels, bedding, or other items with a patient who is confirmed to have, or being evaluated for, COVID-19 infection.  After the person uses these items, you should wash them thoroughly with soap and water.  Wash laundry thoroughly  Immediately remove and wash clothes or bedding that have blood, body fluids, and/or secretions or excretions, such as sweat, saliva,  sputum, nasal mucus, vomit, urine, or feces, on them.  Wear gloves when handling laundry from the patient.  Read and follow directions on labels of laundry or clothing items and detergent. In general, wash and dry with the warmest temperatures recommended on the label.  Clean all areas the individual has used often  Clean all touchable surfaces, such as counters, tabletops, doorknobs, bathroom fixtures, toilets, phones, keyboards, tablets, and bedside tables, every day. Also, clean any surfaces that may have blood, body fluids, and/or secretions or excretions on them.  Wear gloves when cleaning surfaces the patient has come in contact with.  Use a diluted bleach solution (e.g., dilute bleach with 1 part bleach and 10 parts water) or a household disinfectant with a label that says EPA-registered for coronaviruses. To make a bleach solution at home, add 1 tablespoon of bleach to 1 quart (4 cups) of water. For a larger supply, add  cup of bleach to 1 gallon (16 cups) of water.  Read labels of cleaning products and follow recommendations provided on product labels. Labels contain instructions for safe and effective use of the cleaning product including precautions you should take when applying the product, such as wearing gloves or eye protection and making sure you have good ventilation during use of the product.  Remove gloves and wash hands immediately after cleaning.  Monitor yourself for signs and symptoms of illness Caregivers and household members are considered close contacts, should monitor their health, and will be asked to limit movement outside of the home to the extent possible. Follow the monitoring steps for close contacts listed on the symptom monitoring form.  ? If you have additional questions, contact your local health department or call the epidemiologist on call at [phone] (available 24/7). ? This guidance is subject to change. For the most up-to-date guidance from  CDC, please refer to their website: YouBlogs.pl    Activity:  As tolerated with Full fall precautions use walker/cane & assistance as needed Discharge Instructions    (HEART FAILURE PATIENTS) Call MD:  Anytime you have any of the following symptoms: 1) 3 pound weight gain in 24 hours or 5 pounds in 1 week 2) shortness of breath, with or without a dry hacking cough 3) swelling in the hands, feet or stomach 4) if you have to sleep on extra pillows at night in order to breathe.   Complete by: As directed    Diet - low sodium heart healthy   Complete by: As directed    Diet Carb Modified   Complete by: As directed    Increase activity slowly   Complete by: As directed      Allergies as of 06/20/2019      Reactions   Shrimp [shellfish Allergy] Anaphylaxis      Medication List  STOP taking these medications   colchicine 0.6 MG tablet   furosemide 20 MG tablet Commonly known as: LASIX   indomethacin 50 MG capsule Commonly known as: INDOCIN   Insulin Pen Needle 31G X 8 MM Misc   levofloxacin 750 MG tablet Commonly known as: LEVAQUIN   metFORMIN 1000 MG tablet Commonly known as: GLUCOPHAGE   metoprolol tartrate 25 MG tablet Commonly known as: LOPRESSOR Replaced by: metoprolol tartrate 5 MG/5ML Soln injection   omeprazole 40 MG capsule Commonly known as: PRILOSEC   Tresiba FlexTouch 100 UNIT/ML Sopn FlexTouch Pen Generic drug: insulin degludec   vitamin B-12 1000 MCG tablet Commonly known as: CYANOCOBALAMIN     TAKE these medications   acetaminophen 325 MG tablet Commonly known as: TYLENOL Take 2 tablets (650 mg total) by mouth every 6 (six) hours as needed for mild pain or headache (fever >/= 101).   albuterol 108 (90 Base) MCG/ACT inhaler Commonly known as: VENTOLIN HFA Inhale 2 puffs into the lungs every 6 (six) hours.   allopurinol 100 MG tablet Commonly known as: ZYLOPRIM Take 100 mg by mouth  daily.   alum & mag hydroxide-simeth 200-200-20 MG/5ML suspension Commonly known as: MAALOX/MYLANTA Take 15 mLs by mouth every 6 (six) hours as needed for indigestion or heartburn.   amiodarone 360-4.14 MG/200ML-% Soln Commonly known as: NEXTERONE PREMIX Inject 30 mg/hr into the vein continuous.   atorvastatin 20 MG tablet Commonly known as: LIPITOR Take 20 mg by mouth daily.   bisacodyl 10 MG suppository Commonly known as: DULCOLAX Place 1 suppository (10 mg total) rectally daily as needed for severe constipation.   Breo Ellipta 100-25 MCG/INH Aepb Generic drug: fluticasone furoate-vilanterol Inhale 1 puff into the lungs daily.   dilTIAZem HCl-Dextrose 125-5 MG/125ML-% Soln infusion Inject 5-15 mg/hr into the vein continuous.   finasteride 5 MG tablet Commonly known as: PROSCAR Take 1 tablet (5 mg total) by mouth daily. Start taking on: June 21, 2019   insulin aspart 100 UNIT/ML injection Commonly known as: novoLOG Inject 0-15 Units into the skin 3 (three) times daily with meals.   insulin glargine 100 UNIT/ML injection Commonly known as: LANTUS Inject 0.15 mLs (15 Units total) into the skin at bedtime.   lactulose 10 GM/15ML solution Commonly known as: CHRONULAC Take 45 mLs (30 g total) by mouth 3 (three) times daily as needed for mild constipation.   metoprolol tartrate 5 MG/5ML Soln injection Commonly known as: LOPRESSOR Inject 5 mLs (5 mg total) into the vein every 4 (four) hours as needed (if HR rate >120). Replaces: metoprolol tartrate 25 MG tablet   midodrine 2.5 MG tablet Commonly known as: PROAMATINE Take 1 tablet (2.5 mg total) by mouth 2 (two) times daily with a meal.   ondansetron 4 MG disintegrating tablet Commonly known as: ZOFRAN-ODT Take 1 tablet by mouth every 8 (eight) hours as needed.   pantoprazole 40 MG tablet Commonly known as: PROTONIX Take 1 tablet (40 mg total) by mouth 2 (two) times daily before a meal.   polyethylene glycol 17  g packet Commonly known as: MIRALAX / GLYCOLAX Take 17 g by mouth 3 (three) times daily.   Pradaxa 150 MG Caps capsule Generic drug: dabigatran Take 150 mg by mouth 2 (two) times daily.   senna 8.6 MG Tabs tablet Commonly known as: SENOKOT Take 2 tablets (17.2 mg total) by mouth at bedtime.   sucralfate 1 GM/10ML suspension Commonly known as: CARAFATE Take 10 mLs (1 g total) by mouth 4 (four) times  daily -  with meals and at bedtime.   tamsulosin 0.4 MG Caps capsule Commonly known as: FLOMAX Take 0.4 mg by mouth every evening.       Contact information for follow-up providers    [name], MD. Schedule an appointment as soon as possible for a visit in 1 week(s).   Specialty: Internal Medicine Contact information: [place] [address] [phone]            Contact information for after-discharge care    Destination    [facility] Preferred SNF .   Service: Skilled Nursing Contact information: [address] [address] [phone]                 Allergies  Allergen Reactions   Shrimp [Shellfish Allergy] Anaphylaxis    Consultations:   None  Other Procedures/Studies: Ct Abdomen Pelvis Wo Contrast  Result Date: 06/17/2019 CLINICAL DATA:  Abdominal distension. EXAM: CT ABDOMEN AND PELVIS WITHOUT CONTRAST TECHNIQUE: Multidetector CT imaging of the abdomen and pelvis was performed following the standard protocol without IV contrast. COMPARISON:  Chest x-ray May 31, 2019. CT abdomen and pelvis May 31, 2019. FINDINGS: Lower chest: There are patchy infiltrates in the bases and right middle lobe which are new in the interval, worrisome for an infectious process. Atypical infections should be considered. Lung bases are otherwise normal. Lower chest is otherwise normal. Hepatobiliary: A low-attenuation lesion in the right hepatic lobe on series 2, image 17 is stable and  remains too small to characterize. Statistically, this is most likely a small benign lesion such as a cyst. No other liver abnormalities are identified. The gallbladder is normal. The portal veins not assessed due to lack of contrast. Pancreas: Unremarkable. No pancreatic ductal dilatation or surrounding inflammatory changes. Spleen: Normal in size without focal abnormality. Adrenals/Urinary Tract: Bilateral perinephric stranding is stable and likely nonacute. The kidneys and ureters are otherwise normal. There is a Foley catheter in the bladder. The bladder is partially decompressed. There is air in the bladder is well, likely due to the Foley catheter. The fluid in the bladder demonstrates an attenuation of 49 Hounsfield units. Stomach/Bowel: Stomach is within normal limits. Appendix appears normal. No evidence of bowel wall thickening, distention, or inflammatory changes. Vascular/Lymphatic: Calcified atherosclerosis is seen in the nonaneurysmal aorta. No adenopathy. Reproductive: Prostate is unremarkable. Other: No abdominal wall hernia or abnormality. No abdominopelvic ascites. Musculoskeletal: No acute or significant osseous findings. IMPRESSION: 1. Patchy infiltrates in the lung bases and right middle lobe are new in the interval, worrisome for an infectious process. Atypical infections should be considered. 2. The bladder is partially decompressed with a Foley catheter. The fluid in the bladder demonstrates an attenuation of 49 Hounsfield units. This could represent blood products. Recommend clinical correlation. 3. Atherosclerosis in the nonaneurysmal aorta. Aortic Atherosclerosis (ICD10-I70.0). Electronically Signed   By: Dorise Bullion III M.D   On: 06/17/2019 11:22   Ct Abdomen Pelvis Wo Contrast  Result Date: 05/31/2019 CLINICAL DATA:  Weight loss, unintended, non-localized Abdominal pain, nausea vomiting and diarrhea. Shortness of breath. Fevers and chills over the last 2 days. Hypoxia EXAM: CT  ABDOMEN AND PELVIS WITHOUT CONTRAST TECHNIQUE: Multidetector CT imaging of the abdomen and pelvis was performed following the standard protocol without IV contrast. COMPARISON:  CT of the abdomen and pelvis 11/21/2017 FINDINGS: Lower chest: Scarring of the lingula and right middle lobe is stable. Minimal dependent atelectasis is present. Heart  size is normal. Hepatobiliary: There is diffuse fatty infiltration of the liver. No discrete lesions are present. The common bile duct and gallbladder are normal. Pancreas: Unremarkable. No pancreatic ductal dilatation or surrounding inflammatory changes. Spleen: Normal in size without focal abnormality. Adrenals/Urinary Tract: Adrenal glands are normal bilaterally. Kidneys and ureters are within normal limits. There is no stone or mass lesion. No obstruction is present. The urinary bladder is distended. No focal abnormality is present. Stomach/Bowel: The stomach and duodenum are within normal limits. Small bowel is unremarkable. Terminal ileum is within normal limits. Appendix is visualized and normal. The ascending and transverse colon are normal. The descending and sigmoid colon are within normal limits. Vascular/Lymphatic: Atherosclerotic calcifications are present in the aorta and branch vessels 50% narrowing is present at the origin of the SMA. Moderate stenosis is present at the origin of the IMA. Dense atherosclerotic calcifications are present in the proximal iliac arteries bilaterally. Reproductive: Prostate is unremarkable. Other: No abdominal wall hernia or abnormality. No abdominopelvic ascites. Musculoskeletal: Advanced degenerative changes are present in the lower lumbar spine. No acute abnormalities are present. Bony pelvis is within normal limits. The hips are located and within normal limits. IMPRESSION: 1. No acute or focal lesion to explain the patient's symptoms. 2. Hepatic steatosis. 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 4. Aortic Atherosclerosis (ICD10-I70.0). Electronically Signed   By: San Morelle M.D.   On: 05/31/2019 07:11   Dg Abd 1 View  Result Date: 06/08/2019 CLINICAL DATA:  Constipation EXAM: ABDOMEN - 1 VIEW COMPARISON:  CT abdomen pelvis-05/31/2019; chest radiograph-earlier same day FINDINGS: Moderate gaseous distention of the colon without upstream distension of the small bowel. Unremarkable colonic stool burden. Nondiagnostic evaluation for pneumoperitoneum secondary to supine positioning and exclusion of the lower thorax. No pneumatosis or portal venous gas. No definitive abnormal intra-abdominal calcifications. Degenerative change the lower lumbar spine and bilateral hips is suspected though incompletely evaluated. IMPRESSION: 1. Mild gaseous distention of the colon without evidence of enteric obstruction. 2. Unremarkable colonic stool burden. Electronically Signed   By: Sandi Mariscal M.D.   On: 06/08/2019 08:33   Nm Pulmonary Perf And Vent  Result Date: 06/01/2019 CLINICAL DATA:  Hypoxemia, shortness of breath since Tuesday, COPD, asthma, question pulmonary embolism EXAM: NUCLEAR MEDICINE VENTILATION - PERFUSION LUNG SCAN TECHNIQUE: Ventilation images were obtained in multiple projections using inhaled aerosol Tc-7mDTPA. Perfusion images were obtained in multiple projections after intravenous injection of Tc-973mAA. RADIOPHARMACEUTICALS:  31.98 mCi of Tc-9970mPA aerosol inhalation and 4.12 mCi Tc99m53m IV COMPARISON:  None Correlation: Chest radiograph 05/31/2019 FINDINGS: Ventilation: Overall did irregular diminished ventilation within the upper lobes. Focal ventilatory defect lateral aspect RIGHT upper lobe. Prominent LEFT hilum. Perfusion: Matching perfusion defect lateral RIGHT upper lobe. Remaining perfusion normal. No additional segmental or subsegmental perfusion defects. Chest radiograph: Scattered interstitial prominence in the periphery of the lungs bilaterally  greater in RIGHT upper lobe at site of ventilation and perfusion abnormality. IMPRESSION: Low probability for pulmonary embolism. Electronically Signed   By: MarkLavonia Dana.   On: 06/01/2019 08:21   Dg Chest Port Am  Result Date: 06/19/2019 CLINICAL DATA:  Shortness of breath EXAM: PORTABLE CHEST 1 VIEW COMPARISON:  May 31, 2019. FINDINGS: There is cardiomegaly with pulmonary venous hypertension. There is diffuse interstitial prominence in the periphery of each lung, progressed from recent study. There is no frank airspace consolidation. There are small pleural effusions bilaterally. There  is aortic atherosclerosis. No adenopathy. Bones appear osteoporotic. IMPRESSION: Cardiomegaly with pulmonary vascular congestion. Small pleural effusions bilaterally. Diffuse interstitial prominence in the periphery of each lung, likely interstitial edema superimposed on underlying chronic fibrosis. There is felt to likely be a degree of congestive heart failure superimposed on fibrosis. No frank airspace consolidation. Aortic Atherosclerosis (ICD10-I70.0). Electronically Signed   By: Lowella Grip III M.D.   On: 06/19/2019 07:59   Dg Chest Portable 1 View  Result Date: 05/31/2019 CLINICAL DATA:  Shortness of breath EXAM: PORTABLE CHEST 1 VIEW COMPARISON:  January 05, 2016 FINDINGS: The heart size and mediastinal contours are within normal limits. Aortic knob calcifications are seen. There is minimally increased interstitial markings seen at the periphery. There is bibasilar subsegmental atelectasis. No large airspace consolidation or pleural effusion. No acute osseous abnormality. IMPRESSION: Mildly increased interstitial markings which could be due to interstitial edema. Electronically Signed   By: Prudencio Pair M.D.   On: 05/31/2019 03:52   Dg Abd Portable 2v  Result Date: 06/15/2019 CLINICAL DATA:  Vomiting. EXAM: PORTABLE ABDOMEN - 2 VIEW COMPARISON:  Abdominal radiograph dated 06/08/2019 and chest x-ray  dated 05/31/2019 FINDINGS: The bowel gas pattern is normal. No dilated loops of large or small bowel. No free air in the abdomen. No acute bone abnormality. The patient appears to have hazy peripheral infiltrates at the lung bases which appear to be new since 05/31/2019. IMPRESSION: 1. Benign-appearing abdomen. 2. There appear to be new peripheral infiltrates at the left and right lung bases. Electronically Signed   By: Lorriane Shire M.D.   On: 06/15/2019 09:31     TODAY-DAY OF DISCHARGE:  Subjective:   [name] today has no headache,no chest abdominal pain,no new weakness tingling or numbness, feels much better wants to go home today.   Objective:   Blood pressure 120/82, pulse 73, temperature (!) 97.5 F (36.4 C), temperature source Oral, resp. rate (!) 27, height _0  (1.727 m), weight 85.8 kg, SpO2 94 %.  Intake/Output Summary (Last 24 hours) at 06/20/2019 1722 Last data filed at 06/20/2019 1615 Gross per 24 hour  Intake 598.77 ml  Output 1900 ml  Net -1301.23 ml   Filed Weights   06/02/19 0445  Weight: 85.8 kg    Exam: Awake Alert, Oriented *3, No new F.N deficits, Normal affect [place].AT,PERRAL Supple Neck,No JVD, No cervical lymphadenopathy appriciated.  Symmetrical Chest wall movement, Good air movement bilaterally, CTAB RRR,No Gallops,Rubs or new Murmurs, No Parasternal Heave +ve B.Sounds, Abd Soft, Non tender, No organomegaly appriciated, No rebound -guarding or rigidity. No Cyanosis, Clubbing or edema, No new Rash or bruise   PERTINENT RADIOLOGIC STUDIES: Ct Abdomen Pelvis Wo Contrast  Result Date: 06/17/2019 CLINICAL DATA:  Abdominal distension. EXAM: CT ABDOMEN AND PELVIS WITHOUT CONTRAST TECHNIQUE: Multidetector CT imaging of the abdomen and pelvis was performed following the standard protocol without IV contrast. COMPARISON:  Chest x-ray May 31, 2019. CT abdomen and pelvis May 31, 2019. FINDINGS: Lower chest: There are patchy infiltrates in the  bases and right middle lobe which are new in the interval, worrisome for an infectious process. Atypical infections should be considered. Lung bases are otherwise normal. Lower chest is otherwise normal. Hepatobiliary: A low-attenuation lesion in the right hepatic lobe on series 2, image 17 is stable and remains too small to characterize. Statistically, this is most likely a small benign lesion such as a cyst. No other liver abnormalities are identified. The gallbladder is normal. The portal veins not assessed due  to lack of contrast. Pancreas: Unremarkable. No pancreatic ductal dilatation or surrounding inflammatory changes. Spleen: Normal in size without focal abnormality. Adrenals/Urinary Tract: Bilateral perinephric stranding is stable and likely nonacute. The kidneys and ureters are otherwise normal. There is a Foley catheter in the bladder. The bladder is partially decompressed. There is air in the bladder is well, likely due to the Foley catheter. The fluid in the bladder demonstrates an attenuation of 49 Hounsfield units. Stomach/Bowel: Stomach is within normal limits. Appendix appears normal. No evidence of bowel wall thickening, distention, or inflammatory changes. Vascular/Lymphatic: Calcified atherosclerosis is seen in the nonaneurysmal aorta. No adenopathy. Reproductive: Prostate is unremarkable. Other: No abdominal wall hernia or abnormality. No abdominopelvic ascites. Musculoskeletal: No acute or significant osseous findings. IMPRESSION: 1. Patchy infiltrates in the lung bases and right middle lobe are new in the interval, worrisome for an infectious process. Atypical infections should be considered. 2. The bladder is partially decompressed with a Foley catheter. The fluid in the bladder demonstrates an attenuation of 49 Hounsfield units. This could represent blood products. Recommend clinical correlation. 3. Atherosclerosis in the nonaneurysmal aorta. Aortic Atherosclerosis (ICD10-I70.0).  Electronically Signed   By: Dorise Bullion III M.D   On: 06/17/2019 11:22   Ct Abdomen Pelvis Wo Contrast  Result Date: 05/31/2019 CLINICAL DATA:  Weight loss, unintended, non-localized Abdominal pain, nausea vomiting and diarrhea. Shortness of breath. Fevers and chills over the last 2 days. Hypoxia EXAM: CT ABDOMEN AND PELVIS WITHOUT CONTRAST TECHNIQUE: Multidetector CT imaging of the abdomen and pelvis was performed following the standard protocol without IV contrast. COMPARISON:  CT of the abdomen and pelvis 11/21/2017 FINDINGS: Lower chest: Scarring of the lingula and right middle lobe is stable. Minimal dependent atelectasis is present. Heart size is normal. Hepatobiliary: There is diffuse fatty infiltration of the liver. No discrete lesions are present. The common bile duct and gallbladder are normal. Pancreas: Unremarkable. No pancreatic ductal dilatation or surrounding inflammatory changes. Spleen: Normal in size without focal abnormality. Adrenals/Urinary Tract: Adrenal glands are normal bilaterally. Kidneys and ureters are within normal limits. There is no stone or mass lesion. No obstruction is present. The urinary bladder is distended. No focal abnormality is present. Stomach/Bowel: The stomach and duodenum are within normal limits. Small bowel is unremarkable. Terminal ileum is within normal limits. Appendix is visualized and normal. The ascending and transverse colon are normal. The descending and sigmoid colon are within normal limits. Vascular/Lymphatic: Atherosclerotic calcifications are present in the aorta and branch vessels 50% narrowing is present at the origin of the SMA. Moderate stenosis is present at the origin of the IMA. Dense atherosclerotic calcifications are present in the proximal iliac arteries bilaterally. Reproductive: Prostate is unremarkable. Other: No abdominal wall hernia or abnormality. No abdominopelvic ascites. Musculoskeletal: Advanced degenerative changes are present  in the lower lumbar spine. No acute abnormalities are present. Bony pelvis is within normal limits. The hips are located and within normal limits. IMPRESSION: 1. No acute or focal lesion to explain the patient's symptoms. 2. Hepatic steatosis. 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 4. Aortic Atherosclerosis (ICD10-I70.0). Electronically Signed   By: San Morelle M.D.   On: 05/31/2019 07:11   Dg Abd 1 View  Result Date: 06/08/2019 CLINICAL DATA:  Constipation EXAM: ABDOMEN - 1 VIEW COMPARISON:  CT abdomen pelvis-05/31/2019; chest radiograph-earlier same day FINDINGS: Moderate gaseous distention of the colon without upstream distension of the small bowel. Unremarkable colonic stool burden.  Nondiagnostic evaluation for pneumoperitoneum secondary to supine positioning and exclusion of the lower thorax. No pneumatosis or portal venous gas. No definitive abnormal intra-abdominal calcifications. Degenerative change the lower lumbar spine and bilateral hips is suspected though incompletely evaluated. IMPRESSION: 1. Mild gaseous distention of the colon without evidence of enteric obstruction. 2. Unremarkable colonic stool burden. Electronically Signed   By: Sandi Mariscal M.D.   On: 06/08/2019 08:33   Nm Pulmonary Perf And Vent  Result Date: 06/01/2019 CLINICAL DATA:  Hypoxemia, shortness of breath since Tuesday, COPD, asthma, question pulmonary embolism EXAM: NUCLEAR MEDICINE VENTILATION - PERFUSION LUNG SCAN TECHNIQUE: Ventilation images were obtained in multiple projections using inhaled aerosol Tc-68mDTPA. Perfusion images were obtained in multiple projections after intravenous injection of Tc-943mAA. RADIOPHARMACEUTICALS:  31.98 mCi of Tc-9928mPA aerosol inhalation and 4.12 mCi Tc99m39m IV COMPARISON:  None Correlation: Chest radiograph 05/31/2019 FINDINGS: Ventilation: Overall did irregular diminished ventilation within the upper lobes. Focal  ventilatory defect lateral aspect RIGHT upper lobe. Prominent LEFT hilum. Perfusion: Matching perfusion defect lateral RIGHT upper lobe. Remaining perfusion normal. No additional segmental or subsegmental perfusion defects. Chest radiograph: Scattered interstitial prominence in the periphery of the lungs bilaterally greater in RIGHT upper lobe at site of ventilation and perfusion abnormality. IMPRESSION: Low probability for pulmonary embolism. Electronically Signed   By: MarkLavonia Dana.   On: 06/01/2019 08:21   Dg Chest Port Am  Result Date: 06/19/2019 CLINICAL DATA:  Shortness of breath EXAM: PORTABLE CHEST 1 VIEW COMPARISON:  May 31, 2019. FINDINGS: There is cardiomegaly with pulmonary venous hypertension. There is diffuse interstitial prominence in the periphery of each lung, progressed from recent study. There is no frank airspace consolidation. There are small pleural effusions bilaterally. There is aortic atherosclerosis. No adenopathy. Bones appear osteoporotic. IMPRESSION: Cardiomegaly with pulmonary vascular congestion. Small pleural effusions bilaterally. Diffuse interstitial prominence in the periphery of each lung, likely interstitial edema superimposed on underlying chronic fibrosis. There is felt to likely be a degree of congestive heart failure superimposed on fibrosis. No frank airspace consolidation. Aortic Atherosclerosis (ICD10-I70.0). Electronically Signed   By: WillLowella Grip M.D.   On: 06/19/2019 07:59   Dg Chest Portable 1 View  Result Date: 05/31/2019 CLINICAL DATA:  Shortness of breath EXAM: PORTABLE CHEST 1 VIEW COMPARISON:  January 05, 2016 FINDINGS: The heart size and mediastinal contours are within normal limits. Aortic knob calcifications are seen. There is minimally increased interstitial markings seen at the periphery. There is bibasilar subsegmental atelectasis. No large airspace consolidation or pleural effusion. No acute osseous abnormality. IMPRESSION: Mildly  increased interstitial markings which could be due to interstitial edema. Electronically Signed   By: BindPrudencio Pair.   On: 05/31/2019 03:52   Dg Abd Portable 2v  Result Date: 06/15/2019 CLINICAL DATA:  Vomiting. EXAM: PORTABLE ABDOMEN - 2 VIEW COMPARISON:  Abdominal radiograph dated 06/08/2019 and chest x-ray dated 05/31/2019 FINDINGS: The bowel gas pattern is normal. No dilated loops of large or small bowel. No free air in the abdomen. No acute bone abnormality. The patient appears to have hazy peripheral infiltrates at the lung bases which appear to be new since 05/31/2019. IMPRESSION: 1. Benign-appearing abdomen. 2. There appear to be new peripheral infiltrates at the left and right lung bases. Electronically Signed   By: JameLorriane Shire.   On: 06/15/2019 09:31     PERTINENT LAB RESULTS: CBC: Recent Labs    06/19/19 0341 06/20/19 0001  WBC 6.7 7.3  HGB 12.7* 13.0  HCT 38.5* 39.9  PLT 109* 99*   CMET CMP     Component Value Date/Time   NA 135 06/20/2019 0001   K 4.0 06/20/2019 0001   CL 98 06/20/2019 0001   CO2 25 06/20/2019 0001   GLUCOSE 95 06/20/2019 0001   BUN 19 06/20/2019 0001   CREATININE 1.28 (H) 06/20/2019 0001   CALCIUM 8.1 (L) 06/20/2019 0001   PROT 5.6 (L) 06/20/2019 0001   ALBUMIN 2.4 (L) 06/20/2019 0001   AST 22 06/20/2019 0001   ALT 28 06/20/2019 0001   ALKPHOS 32 (L) 06/20/2019 0001   BILITOT 1.1 06/20/2019 0001   GFRNONAA 54 (L) 06/20/2019 0001   GFRAA >60 06/20/2019 0001    GFR Estimated Creatinine Clearance: 53.2 mL/min (A) (by C-G formula based on SCr of 1.28 mg/dL (H)). No results for input(s): LIPASE, AMYLASE in the last 72 hours. No results for input(s): CKTOTAL, CKMB, CKMBINDEX, TROPONINI in the last 72 hours. Invalid input(s): POCBNP No results for input(s): DDIMER in the last 72 hours. No results for input(s): HGBA1C in the last 72 hours. No results for input(s): CHOL, HDL, LDLCALC, TRIG, CHOLHDL, LDLDIRECT in the last 72 hours. No  results for input(s): TSH, T4TOTAL, T3FREE, THYROIDAB in the last 72 hours.  Invalid input(s): FREET3 No results for input(s): VITAMINB12, FOLATE, FERRITIN, TIBC, IRON, RETICCTPCT in the last 72 hours. Coags: No results for input(s): INR in the last 72 hours.  Invalid input(s): PT Microbiology: No results found for this or any previous visit (from the past 240 hour(s)).  FURTHER DISCHARGE INSTRUCTIONS:  Get Medicines reviewed and adjusted: Please take all your medications with you for your next visit with your Primary MD  Laboratory/radiological data: Please request your Primary MD to go over all hospital tests and procedure/radiological results at the follow up, please ask your Primary MD to get all Hospital records sent to his/her office.  In some cases, they will be blood work, cultures and biopsy results pending at the time of your discharge. Please request that your primary care M.D. goes through all the records of your hospital data and follows up on these results.  Also Note the following: If you experience worsening of your admission symptoms, develop shortness of breath, life threatening emergency, suicidal or homicidal thoughts you must seek medical attention immediately by calling 911 or calling your MD immediately  if symptoms less severe.  You must read complete instructions/literature along with all the possible adverse reactions/side effects for all the Medicines you take and that have been prescribed to you. Take any new Medicines after you have completely understood and accpet all the possible adverse reactions/side effects.   Do not drive when taking Pain medications or sleeping medications (Benzodaizepines)  Do not take more than prescribed Pain, Sleep and Anxiety Medications. It is not advisable to combine anxiety,sleep and pain medications without talking with your primary care practitioner  Special Instructions: If you have smoked or chewed Tobacco  in the last 2  yrs please stop smoking, stop any regular Alcohol  and or any Recreational drug use.  Wear Seat belts while driving.  Please note: You were cared for by a hospitalist during your hospital stay. Once you are discharged, your primary care physician will handle any further medical issues. Please note that NO REFILLS for any discharge medications will be authorized once you are discharged, as it is imperative that you return to your primary care physician (or establish a relationship with a primary care physician if you  do not have one) for your post hospital discharge needs so that they can reassess your need for medications and monitor your lab values.  Total Time spent coordinating discharge including counseling, education and face to face time equals 35 minutes.  Signed: Oren Binet 06/20/2019 5:22 PM

## 2019-06-20 NOTE — Progress Notes (Signed)
Facetime daughter and let pt speak with her

## 2019-06-20 NOTE — TOC Progression Note (Signed)
Transition of Care [facility]) - Progression Note    Patient Details  Name: Ebrahim Deremer MRN: [id number] Date of Birth: [birth year]  [facility] CM/SW Contact  Rae Mar, RN Phone Number: 06/20/2019, 3:07 PM  Clinical Narrative:     Per Gerald Stabs at [facility], insurance Josem Kaufmann is still pending as of now.  Expected Discharge Plan: Skilled Nursing Facility Barriers to Discharge: Insurance Authorization  Expected Discharge Plan and Services Expected Discharge Plan: [place]   Discharge Planning Services: CM Consult Post Acute Care Choice: [place] Living arrangements for the past 2 months: Single Family Home Expected Discharge Date: 06/20/19               DME Arranged: N/A DME Agency: NA       HH Arranged: NA HH Agency: NA         Social Determinants of Health (SDOH) Interventions    Readmission Risk Interventions No flowsheet data found.

## 2019-06-20 NOTE — Consult Note (Signed)
Cardiology Consultation:   Patient ID: Jay Morris MRN: [id number]; DOB: [birth year]  Admit date: 06/02/2019 Date of Consult: 06/20/2019  Primary Care Provider: [name], MD Primary Cardiologist: No primary care provider on file.  Primary Electrophysiologist:  None    Patient Profile:   Jay Morris is a 73 y.o. male with a hx of COPD, bronchial asthma, PAF on Pradaxa, DM-2, HTN who presented with fever/dyspnea and found to have acute hypoxic respiratory failure secondary to COVID-19 pneumonia.  He is being seen today for the evaluation of atrial flutter at the request of Dr Sloan Leiter.  History of Present Illness:   Jay Morris is a 73 y.o. male with a hx of COPD, bronchial asthma, PAF on Pradaxa, DM-2, HTN who presented with fever/dyspnea and found to have acute hypoxic respiratory failure secondary to COVID-19 pneumonia.  Hospital course has been complicated by hypoxemia and AF with RVR.  His COVID was treated with steroids, remdesivir, convalescent plasma.  Improving, has been weaned to 2L [place] from 15L.  He has been having AF/AFL with RVR.     He has had longstanding history of AF/AFL.  Underwent ablation at [facility] 6-7 years ago.  On Pradaxa.  He reports that he never misses doses of Pradaxa.  Has not missed any doses since admitted on 11/14.   He went into AF with RVR on 11/20.  BPs were initally soft, was started on IV amiodarone plus PO metoprolol.  On 12/1, he had bradycardia down to HR 40 while in AFL, so amiodarone and metoprolol were stopped.  Subsequently developed AF with RVR and was restarted on IV amio and diltiazem gtt was added.  HR today as been up to 140s, maxed out on diltiazem gtt and continued on IV amio.  BP stable, currently in 120s.  He denies any symptoms while in AF/AFL, specifically denies any palpitations, lightheadedness, chest pain, or syncope.  Does report he currently has some lightheadedness when he stands but otherwise is asymptomatic.  He underwent  TTE on 11/12, which showed normal LV systolic function.  Heart Pathway Score:     Past Medical History:  Diagnosis Date  . Asthma   . Atrial fibrillation ([place])   . BPH (benign prostatic hyperplasia)   . COPD (chronic obstructive pulmonary disease) ([place])   . Diabetes mellitus without complication ([place])   . Helicobacter pylori gastritis   . Hyperlipidemia   . Hypertension   . Pneumonia     Past Surgical History:  Procedure Laterality Date  . CARDIAC ELECTROPHYSIOLOGY MAPPING AND ABLATION    . COLONOSCOPY WITH PROPOFOL N/A 06/15/2017   Procedure: COLONOSCOPY WITH PROPOFOL;  Surgeon: Toledo, Benay Pike, MD;  Location: [facility] ENDOSCOPY;  Service: Gastroenterology;  Laterality: N/A;  . ENTEROSCOPY N/A 11/24/2017   Procedure: ENTEROSCOPY;  Surgeon: Jonathon Bellows, MD;  Location: [facility] ENDOSCOPY;  Service: Gastroenterology;  Laterality: N/A;  . ESOPHAGOGASTRODUODENOSCOPY N/A 11/27/2017   Procedure: ESOPHAGOGASTRODUODENOSCOPY (EGD);  Surgeon: Lucilla Lame, MD;  Location: [facility] ENDOSCOPY;  Service: Endoscopy;  Laterality: N/A;  . ESOPHAGOGASTRODUODENOSCOPY (EGD) WITH PROPOFOL N/A 06/02/2017   Procedure: ESOPHAGOGASTRODUODENOSCOPY (EGD) WITH PROPOFOL;  Surgeon: Toledo, Benay Pike, MD;  Location: [facility] ENDOSCOPY;  Service: Gastroenterology;  Laterality: N/A;  . GIVENS CAPSULE STUDY N/A 11/25/2017   Procedure: GIVENS CAPSULE STUDY;  Surgeon: Jonathon Bellows, MD;  Location[facility] ENDOSCOPY;  Service: Gastroenterology;  Laterality: N/A;  . HAND SURGERY        Inpatient Medications: Scheduled Meds: . albuterol  2 puff Inhalation Q6H  . allopurinol  100 mg Oral Daily  . atorvastatin  20 mg Oral q1800  . Chlorhexidine Gluconate Cloth  6 each Topical Daily  . dabigatran  150 mg Oral BID  . finasteride  5 mg Oral Daily  . fluticasone furoate-vilanterol  1 puff Inhalation Daily  . furosemide  40 mg Intravenous Daily  . insulin aspart  0-15 Units Subcutaneous TID WC  . insulin aspart  2-5 Units Subcutaneous QHS   . insulin glargine  15 Units Subcutaneous QHS  . midodrine  2.5 mg Oral BID WC  . pantoprazole  40 mg Oral BID AC  . polyethylene glycol  17 g Oral TID  . senna  2 tablet Oral QHS  . sucralfate  1 g Oral TID WC & HS  . tamsulosin  0.4 mg Oral QHS  . umeclidinium bromide  1 puff Inhalation Daily   Continuous Infusions: . amiodarone 30 mg/hr (06/20/19 1105)  . diltiazem (CARDIZEM) infusion 15 mg/hr (06/20/19 1840)   PRN Meds: acetaminophen, alum & mag hydroxide-simeth, bisacodyl, chlorpheniramine-HYDROcodone, cyclobenzaprine, diphenhydrAMINE, guaiFENesin-dextromethorphan, lactulose, metoprolol tartrate, ondansetron **OR** ondansetron (ZOFRAN) IV, promethazine, sodium chloride  Allergies:    Allergies  Allergen Reactions  . Shrimp [Shellfish Allergy] Anaphylaxis    Social History:   Social History   Socioeconomic History  . Marital status: Married    Spouse name: Not on file  . Number of children: Not on file  . Years of education: Not on file  . Highest education level: Not on file  Occupational History  . Not on file  Social Needs  . Financial resource strain: Not on file  . Food insecurity    Worry: Not on file    Inability: Not on file  . Transportation needs    Medical: Not on file    Non-medical: Not on file  Tobacco Use  . Smoking status: Former Smoker    Packs/day: 1.00    Years: 30.00    Pack years: 30.00    Types: Cigarettes  . Smokeless tobacco: Never Used  Substance and Sexual Activity  . Alcohol use: No    Alcohol/week: 0.0 standard drinks    Comment: occasional  . Drug use: No  . Sexual activity: Yes  Lifestyle  . Physical activity    Days per week: Not on file    Minutes per session: Not on file  . Stress: Not on file  Relationships  . Social Herbalist on phone: Not on file    Gets together: Not on file    Attends religious service: Not on file    Active member of club or organization: Not on file    Attends meetings of clubs or  organizations: Not on file    Relationship status: Not on file  . Intimate partner violence    Fear of current or ex partner: Not on file    Emotionally abused: Not on file    Physically abused: Not on file    Forced sexual activity: Not on file  Other Topics Concern  . Not on file  Social History Narrative  . Not on file    Family History:   Family History  Problem Relation Age of Onset  . Heart disease Mother   . Cancer Father      ROS:  Please see the history of present illness.  All other ROS reviewed and negative.     Physical Exam/Data:   Vitals:   06/20/19 1745 06/20/19 1800 06/20/19 1815 06/20/19 1830  BP: (!) 131/93 (!) 151/98 (!) 146/84 119/81  Pulse: (!) 145 (!) 142 (!) 146 (!) 144  Resp: _0 Temp:      TempSrc:      SpO2: 94% 91% 92% 90%  Weight:      Height:        Intake/Output Summary (Last 24 hours) at 06/20/2019 1850 Last data filed at 06/20/2019 1615 Gross per 24 hour  Intake 598.77 ml  Output 1900 ml  Net -1301.23 ml   Last 3 Weights 06/02/2019 06/01/2019 05/31/2019  Weight (lbs) 189 lb 2.5 oz 186 lb 4.6 oz 192 lb 3.9 oz  Weight (kg) 85.8 kg 84.5 kg 87.2 kg     Body mass index is 28.76 kg/m.  Limited exam as patient seen via FaceTime video. General:  in no acute distress.  Answers questions appropriately.  Does not appear short of breath  EKG:  The EKG was personally reviewed and demonstrates:  Atrial flutter with 4:1 conduction, HR 70 bpm   Relevant CV Studies: TTE 05/31/19:  1. Left ventricular ejection fraction, by visual estimation, is 60 to 65%. The left ventricle has normal function. Left ventricular septal wall thickness was mildly increased. Mildly increased left ventricular posterior wall thickness. There is mildly  increased left ventricular hypertrophy.  2. Global right ventricle has normal systolic function.The right ventricular size is mildly enlarged. No increase in right ventricular wall thickness.  3. Left atrial  size was normal.  4. Right atrial size was mildly dilated.  5. The mitral valve was not well visualized. Trace mitral valve regurgitation.  6. The tricuspid valve is not well visualized. Tricuspid valve regurgitation is mild.  7. The aortic valve is grossly normal. Aortic valve regurgitation is trivial.  8. The pulmonic valve was not well visualized. Pulmonic valve regurgitation is trivial.  9. The aortic root was not well visualized. 10. Mildly elevated pulmonary artery systolic pressure. 11. The atrial septum is grossly normal.  Laboratory Data:  High Sensitivity Troponin:   Recent Labs  Lab 05/31/19 0329 05/31/19 0534 05/31/19 1039  TROPONINIHS 18* 17 14     Chemistry Recent Labs  Lab 06/18/19 1645 06/19/19 0341 06/20/19 0001  NA 131* 135 135  K 4.0 4.1 4.0  CL 98 102 98  CO2 _1 GLUCOSE 304* 107* 95  BUN 26* 21 19  CREATININE 1.42* 1.23 1.28*  CALCIUM 7.5* 8.1* 8.1*  GFRNONAA 48* 57* 54*  GFRAA 56* >60 >60  ANIONGAP _2 Recent Labs  Lab 06/18/19 1645 06/19/19 0341 06/20/19 0001  PROT 5.2* 5.2* 5.6*  ALBUMIN 2.3* 2.3* 2.4*  AST _3 ALT _4 ALKPHOS 30* 28* 32*  BILITOT 1.3* 0.8 1.1   Hematology Recent Labs  Lab 06/15/19 1004 06/19/19 0341 06/20/19 0001  WBC 11.8* 6.7 7.3  RBC 4.60 4.06* 4.14*  HGB 14.9 12.7* 13.0  HCT 43.8 38.5* 39.9  MCV 95.2 94.8 96.4  MCH 32.4 31.3 31.4  MCHC 34.0 33.0 32.6  RDW 14.6 14.8 15.0  PLT 188 109* 99*   BNP Recent Labs  Lab 06/19/19 0341  BNP 133.1*    DDimer No results for input(s): DDIMER in the last 168 hours.   Radiology/Studies:  Ct Abdomen Pelvis Wo Contrast  Result Date: 06/17/2019 CLINICAL DATA:  Abdominal distension. EXAM: CT ABDOMEN AND PELVIS WITHOUT CONTRAST TECHNIQUE: Multidetector CT imaging of the abdomen and pelvis was performed following the standard protocol without  IV contrast. COMPARISON:  Chest x-ray May 31, 2019. CT abdomen and pelvis May 31, 2019.  FINDINGS: Lower chest: There are patchy infiltrates in the bases and right middle lobe which are new in the interval, worrisome for an infectious process. Atypical infections should be considered. Lung bases are otherwise normal. Lower chest is otherwise normal. Hepatobiliary: A low-attenuation lesion in the right hepatic lobe on series 2, image 17 is stable and remains too small to characterize. Statistically, this is most likely a small benign lesion such as a cyst. No other liver abnormalities are identified. The gallbladder is normal. The portal veins not assessed due to lack of contrast. Pancreas: Unremarkable. No pancreatic ductal dilatation or surrounding inflammatory changes. Spleen: Normal in size without focal abnormality. Adrenals/Urinary Tract: Bilateral perinephric stranding is stable and likely nonacute. The kidneys and ureters are otherwise normal. There is a Foley catheter in the bladder. The bladder is partially decompressed. There is air in the bladder is well, likely due to the Foley catheter. The fluid in the bladder demonstrates an attenuation of 49 Hounsfield units. Stomach/Bowel: Stomach is within normal limits. Appendix appears normal. No evidence of bowel wall thickening, distention, or inflammatory changes. Vascular/Lymphatic: Calcified atherosclerosis is seen in the nonaneurysmal aorta. No adenopathy. Reproductive: Prostate is unremarkable. Other: No abdominal wall hernia or abnormality. No abdominopelvic ascites. Musculoskeletal: No acute or significant osseous findings. IMPRESSION: 1. Patchy infiltrates in the lung bases and right middle lobe are new in the interval, worrisome for an infectious process. Atypical infections should be considered. 2. The bladder is partially decompressed with a Foley catheter. The fluid in the bladder demonstrates an attenuation of 49 Hounsfield units. This could represent blood products. Recommend clinical correlation. 3. Atherosclerosis in the  nonaneurysmal aorta. Aortic Atherosclerosis (ICD10-I70.0). Electronically Signed   By: Dorise Bullion III M.D   On: 06/17/2019 11:22   Dg Chest Port Am  Result Date: 06/19/2019 CLINICAL DATA:  Shortness of breath EXAM: PORTABLE CHEST 1 VIEW COMPARISON:  May 31, 2019. FINDINGS: There is cardiomegaly with pulmonary venous hypertension. There is diffuse interstitial prominence in the periphery of each lung, progressed from recent study. There is no frank airspace consolidation. There are small pleural effusions bilaterally. There is aortic atherosclerosis. No adenopathy. Bones appear osteoporotic. IMPRESSION: Cardiomegaly with pulmonary vascular congestion. Small pleural effusions bilaterally. Diffuse interstitial prominence in the periphery of each lung, likely interstitial edema superimposed on underlying chronic fibrosis. There is felt to likely be a degree of congestive heart failure superimposed on fibrosis. No frank airspace consolidation. Aortic Atherosclerosis (ICD10-I70.0). Electronically Signed   By: Lowella Grip III M.D.   On: 06/19/2019 07:59    Assessment and Plan:   AFL with RVR: currently in AFL with variable conduction, rates up to 140s. Asymptomatic.  On diltiazem gtt at 15 mg/hr and IV amio gtt.  On Pradaxa.  Had episode of bradycardia down to HR 40 yesterday while in AFL.  - Rate control appeared to be better on metoprolol compared to diltiazem, would recommend starting PO metoprolol 25 mg Q6hr and titrating up as needed to titrate off diltiazem gtt.  Can continue IV amiodarone for now.  Closely monitor given prior bradycardia  - Continue Pradaxa.  He has not missed any doses of Pradaxa this admission and reports that he did not miss any doses in the weeks prior to admission.  Given difficulty with rate control, would recommend cardioversion once able to come off isolation, which reportedly should be this week  For questions or updates, please contact [place]  Please consult [email or web address] for contact info under     Signed, Donato Heinz, MD  06/20/2019 6:50 PM

## 2019-06-20 NOTE — Progress Notes (Addendum)
06/20/19 1245  Vitals  BP 119/82  MAP (mmHg) 95  Pulse Rate (!) 147  ECG Heart Rate (!) 146  Resp 18  Oxygen Therapy  SpO2 95 %  MEWS Score  MEWS RR 0  MEWS Pulse 3  MEWS Systolic 0  MEWS LOC 0  MEWS Temp 0  MEWS Score 3  MEWS Score Color Yellow  MD updated on pt HR see nedw orders

## 2019-06-20 NOTE — Progress Notes (Signed)
Daughter called requesting update from MD. Pt called daughter and told her he wanted to go home and die. However pt as no complains when RN in room. States she is ok. Notified MD

## 2019-06-21 DIAGNOSIS — U071 COVID-19: Secondary | ICD-10-CM

## 2019-06-21 LAB — BASIC METABOLIC PANEL
Anion gap: 10 (ref 5–15)
BUN: 24 mg/dL — ABNORMAL HIGH (ref 8–23)
CO2: 28 mmol/L (ref 22–32)
Calcium: 8.1 mg/dL — ABNORMAL LOW (ref 8.9–10.3)
Chloride: 95 mmol/L — ABNORMAL LOW (ref 98–111)
Creatinine, Ser: 1.42 mg/dL — ABNORMAL HIGH (ref 0.61–1.24)
GFR calc Af Amer: 56 mL/min — ABNORMAL LOW (ref >60)
GFR calc non Af Amer: 48 mL/min — ABNORMAL LOW (ref >60)
Glucose, Bld: 170 mg/dL — ABNORMAL HIGH (ref 70–99)
Potassium: 4.1 mmol/L (ref 3.5–5.1)
Sodium: 133 mmol/L — ABNORMAL LOW (ref 135–145)

## 2019-06-21 LAB — CBC
HCT: 37.5 % — ABNORMAL LOW (ref 39.0–52.0)
Hemoglobin: 12.5 g/dL — ABNORMAL LOW (ref 13.0–17.0)
MCH: 31.6 pg (ref 26.0–34.0)
MCHC: 33.3 g/dL (ref 30.0–36.0)
MCV: 94.9 fL (ref 80.0–100.0)
Platelets: 101 10*3/uL — ABNORMAL LOW (ref 150–400)
RBC: 3.95 MIL/uL — ABNORMAL LOW (ref 4.22–5.81)
RDW: 14.9 % (ref 11.5–15.5)
WBC: 5.9 10*3/uL (ref 4.0–10.5)
nRBC: 0 % (ref 0.0–0.2)

## 2019-06-21 LAB — GLUCOSE, CAPILLARY
Glucose-Capillary: 172 mg/dL — ABNORMAL HIGH (ref 70–99)
Glucose-Capillary: 175 mg/dL — ABNORMAL HIGH (ref 70–99)
Glucose-Capillary: 202 mg/dL — ABNORMAL HIGH (ref 70–99)
Glucose-Capillary: 133 mg/dL — ABNORMAL HIGH (ref 70–99)

## 2019-06-21 MED ORDER — INSULIN ASPART 100 UNIT/ML ~~LOC~~ SOLN
4.0000 [IU] | Freq: Three times a day (TID) | SUBCUTANEOUS | Status: DC
  Administered 2019-06-21 – 2019-06-25 (×11): 4 [IU] via SUBCUTANEOUS

## 2019-06-21 MED ORDER — INSULIN GLARGINE 100 UNIT/ML ~~LOC~~ SOLN
20.0000 [IU] | Freq: Every day | SUBCUTANEOUS | Status: DC
  Administered 2019-06-21 – 2019-06-24 (×4): 20 [IU] via SUBCUTANEOUS
  Filled 2019-06-21 (×6): qty 0.2

## 2019-06-21 MED ORDER — METOPROLOL TARTRATE 50 MG PO TABS
50.0000 mg | ORAL_TABLET | Freq: Four times a day (QID) | ORAL | Status: DC
  Administered 2019-06-21 – 2019-06-22 (×4): 50 mg via ORAL
  Filled 2019-06-21: qty 1
  Filled 2019-06-21 (×2): qty 2
  Filled 2019-06-21 (×3): qty 1

## 2019-06-21 MED ORDER — FUROSEMIDE 10 MG/ML IJ SOLN
40.0000 mg | Freq: Every day | INTRAMUSCULAR | Status: DC
  Administered 2019-06-21 – 2019-06-24 (×4): 40 mg via INTRAVENOUS
  Filled 2019-06-21 (×4): qty 4

## 2019-06-21 MED ORDER — FUROSEMIDE 10 MG/ML IJ SOLN: 40.0000 mg | Freq: Every day | INTRAMUSCULAR | 0 refills | Status: DC

## 2019-06-21 NOTE — Plan of Care (Signed)
  Problem: Education: Goal: Knowledge of General Education information will improve Description: Including pain rating scale, medication(s)/side effects and non-pharmacologic comfort measures Outcome: Progressing   Problem: Health Behavior/Discharge Planning: Goal: Ability to manage health-related needs will improve Outcome: Progressing

## 2019-06-21 NOTE — Progress Notes (Signed)
Called daughter updated on patient status. Patients has had no complaints today when asked. Advised daughter nurse will facetime later today.

## 2019-06-21 NOTE — Progress Notes (Signed)
Cardiology E-Consult Progress Note  Reviewed recommendations made by cardiology overnight. EKG this AM appears to be atrial fibrillation. EKG with rate in the 70s. Chart reviewed as this patient is being treated for coronavirus and direct contact is not able to be performed at this time.   Cardiology overnight recommended to stop his diltiazem drip and to re start his metoprolol. He has remained on amiodarone drip.   Atrial Fibrillation with RVR: Would recommend to stop ditiazem drip as done overnight. Continue metoprolol 25 mg Q6 and titrate up as needed. Would recommend to stop amiodarone, he has received several days IV. If we get into issues with RVR, we will plan for a full amiodarone load. Would continue pradaxa. Would prefer rate control strategy for now, and then consider DCCV after hospitalization.   [place] T. Audie Box, [place]  [address] [place], [place] [address] [phone]  9:57 AM

## 2019-06-21 NOTE — Progress Notes (Signed)
Called report to jim at [facility] [place] room 18. Called [facility] to request transport.

## 2019-06-21 NOTE — Progress Notes (Signed)
Report called by previous nurse Melissa.  Full bath, foley care completed.  Patient changed into paper gown and taken to transport at hospital doors.  All belongings in room including cell phone, charger, shaving kit, multiple books sent with the patient.  Patient's daughter, Ivin Booty updated by phone [phone]).

## 2019-06-21 NOTE — Progress Notes (Signed)
facetime daughter. Updated pt and family on [facility] transfer

## 2019-06-21 NOTE — Progress Notes (Addendum)
PROGRESS NOTE                                                                                                                                                                                                             Patient Demographics:    Jay Morris, is a 73 y.o. male, DOB - Mar 13, 1946, KJZ:[id number]  Outpatient Primary MD for the patient is [name], MD   Admit date - 06/02/2019   LOS - 19  No chief complaint on file.      Brief Narrative: Patient is a 73 y.o. male with PMHx of COPD, bronchial asthma, PAF on Pradaxa, DM-2, HTN-who presented with fever, chills and worsening shortness of breath-he was found to have acute hypoxic respiratory failure secondary to COVID-19 pneumonia.  Hospital course was complicated by progressively worsening hypoxemia requiring 15 L of high flow oxygen and A. fib with RVR.  Patient was treated with steroids, remdesivir, convalescent plasma with slow clinical improvement.  Further hospital course lately has been complicated by acute urinary retention requiring Foley catheter insertion, constipation/fecal impaction requiring laxative/enema, symptomatic orthostatic hypotension and A. fib/flutter with RVR requiring amiodarone infusion/Cardizem infusion.  Due to ongoing difficulties with rate control-patient will be transferred to [facility] for a formal cardiology evaluation.    Note-patient's first positive Covid test was on 11/11 at [facility] (results available in care everywhere)-he has improved significantly in terms of his Covid pneumonia-spoke with Dr. Luvenia Heller on-call-patient does not require to be on isolation as he is now more than 3 weeks from his first test.   Subjective:    [name] had a bowel movement earlier today-he was sitting up at bedside chair.  Appears very comfortable.  Most of his complaints are related to nursing issues-he claims-he does not help right  away to get to the bathroom or to use the bedside commode.    He seems frustrated by these issues-and then tells his family that he thinks he is going to die in here if he does not get out.   Assessment  & Plan :   Acute Hypoxic Resp Failure due to Covid 19 Viral pneumonia: Improved-on just 2 L of oxygen.  At one point he was on 15 L of high flow oxygen.  Has completed a course of steroid and remdesivir.  Suspect will require home O2  on discharge.  Fever: afebrile  O2 requirements:  SpO2: 91 % O2 Flow Rate (L/min): 2 L/min   COVID-19 Labs: No results for input(s): DDIMER, FERRITIN, LDH, CRP in the last 72 hours.  Lab Results  Component Value Date   SARSCOV2NAA NEGATIVE 05/31/2019    SARS-COV-2, NAA -detected on 05/30/2019 at [facility] available in care everywhere.  COVID-19 Medications: Steroids: 11/11>> 11/24 Remdesivir: 11/13>> 11/17 Actemra: Not given Convalescent Plasma: 11/15 x 1  Other medications: Diuretics:Euvolemic Antibiotics:Not needed as no evidence of bacterial infection  Prone/Incentive Spirometry: encouraged  incentive spirometry use 3-4/hour.  DVT Prophylaxis  : Pradaxa  Paroxysmal a flutter/A. fib with RVR: Difficult rate control-has required amiodarone infusion, Cardizem infusion (discontinued last night due to episodes of bradycardia)-and oral beta-blockade.  Heart rate much better today.  Discussed at length with cardiology today-recommends that we increase beta-blocker dosage-and stop amiodarone.  Cardiology does not recommend starting oral amiodarone.  Continue Pradaxa.  Continue close telemetry monitoring  Addendum 4:25 PM: Back in RVR with rate sustaining in the 130s-140s this afternoon-spoke with cardiology-continue amiodarone infusion-continue beta-blocker-transferred to [facility].  Cardiology will again formally evaluate there.  Decompensated diastolic heart failure: Likely secondary to RVR-more compensated-oxygen requirement stable adjust  2 L.  Continue IV Lasix.  Will need to use caution as patient has history of chronic-orthostatic hypotension.   Abdominal pain: Suspect either from GERD/dyspepsia from constipation.  CT scan of the abdomen on 11/29 did not show any acute abnormalities.  Abdominal pain has since resolved-he is maintained on MiraLAX and senna-along with as needed lactulose and Dulcolax suppository.  Acute urinary retention: Developed on 11/27-with discomfort and inability to urinate-bladder scan showed more than 1000 mL-Foley catheter placed.  Flomax was increased to 0.8 mg-finasteride was added.  Voiding trial on 11/29 was unsuccessful-Foley catheter was reinserted on 12/1-plans are for a outpatient voiding trial.  However given his orthostatic hypotension and soft blood pressure-we will decrease Flomax back to 0.4 mg.  Patient will need outpatient follow-up with urology.   Orthostatic hypotension: Secondary to Lasix, intermittent poor oral intake-and numerous BMs that the patient had yesterday.  However-Per patient this is a chronic issue-and he claims that he actually sits at the edge of the bed before he gets up.  Continue low-dose midodrine-add TED hose.  AKI on CKD stage IIIa: AKI improved-creatinine back to baseline-follow periodically  DM-2: CBG stable-continue Lantus 20 units nightly and SSI.  Follow and adjust.    CBG (last 3)  Recent Labs    06/20/19 2133 06/21/19 0815 06/21/19 1231  GLUCAP 406* 172* 175*   HTN: Blood pressure controlled-on metoprolol.  Constipation: Remains on maintenance MiraLAX/senna/as needed lactulose, Dulcolax.  Per patient-he had a small bowel movement yesterday.  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  Consults  :  None  Procedures  :  None  ABG: No results found for: PHART, PCO2ART, PO2ART, HCO3, TCO2, ACIDBASEDEF, O2SAT  Vent Settings: N/A  Condition - Stable  Family Communication  : Long conversation with daughter on 12/3.  She is aware that there are currently no beds available at [facility].  She is aware that if patient stabilizes-he may be in fact discharged from [facility]: Notified family-about plans to transfer to [facility].  Code Status :  Full Code  Diet :  Diet Order            Diet - low sodium heart healthy        Diet Carb Modified        Diet heart healthy/carb modified Room service appropriate? Yes; Fluid consistency: Thin  Diet effective now               Disposition Plan  :  Remain hospitalized-although plans initially were for discharge to SNF-daughter on 12/3-indicates that family would prefer that the patient come home with maximal home health services.  Barriers to discharge: Hypoxia requiring O2 supplementation/A. fib with RVR  Antimicorbials  :    Anti-infectives (From admission, onward)   Start     Dose/Rate Route Frequency Ordered Stop   06/03/19 1000  remdesivir 100 mg in sodium chloride 0.9 % 250 mL IVPB     100 mg 500 mL/hr over 30 Minutes Intravenous Every 24 hours 06/02/19 0511 06/05/19 1129   06/02/19 1600  remdesivir 100 mg in sodium chloride 0.9 % 250 mL IVPB     100 mg 500 mL/hr over 30 Minutes Intravenous Every 24 hours 06/02/19 0511 06/02/19 1657      Inpatient Medications  Scheduled Meds:  albuterol  2 puff Inhalation Q6H   allopurinol  100 mg Oral Daily   atorvastatin  20 mg Oral q1800   Chlorhexidine Gluconate Cloth  6 each Topical Daily   dabigatran  150 mg Oral BID   finasteride  5 mg Oral Daily   fluticasone furoate-vilanterol  1 puff Inhalation Daily   furosemide  40 mg Intravenous Daily   insulin  aspart  0-15 Units Subcutaneous TID WC   insulin aspart  2-5 Units Subcutaneous QHS   insulin aspart  4 Units Subcutaneous TID WC   insulin glargine  20 Units Subcutaneous QHS   metoprolol tartrate  50 mg Oral Q6H   midodrine  2.5 mg Oral BID WC   pantoprazole  40 mg Oral BID AC   polyethylene glycol  17 g Oral TID   senna  2 tablet Oral QHS   sucralfate  1 g Oral TID WC & HS   tamsulosin  0.4 mg Oral QHS   umeclidinium bromide  1 puff Inhalation Daily   Continuous Infusions:  amiodarone 30 mg/hr (06/21/19 1459)   PRN Meds:.acetaminophen, alum & mag hydroxide-simeth, bisacodyl, chlorpheniramine-HYDROcodone, cyclobenzaprine, diphenhydrAMINE, guaiFENesin-dextromethorphan, lactulose, metoprolol tartrate, ondansetron **OR** ondansetron (ZOFRAN) IV, promethazine, sodium chloride   Time Spent in minutes  35  See all Orders from today for further details   Oren Binet M.D on 06/21/2019 at 3:30 PM  To page go to [email or web address] - use universal password  [facility] -  Office  [phone]    Objective:   Vitals:   06/21/19 0638 06/21/19 0815 06/21/19 0830 06/21/19 0845  BP:  (!) 106/56 (!) 104/54 113/63  Pulse: 84 67 68 67  Resp: 20 (!) _0 Temp:      TempSrc:      SpO2: 93% 98% 93% 91%  Weight:      Height:        Wt Readings from Last 3 Encounters:  06/02/19 85.8 kg  06/01/19 84.5 kg  12/29/17 87.3 kg     Intake/Output Summary (Last 24 hours) at 06/21/2019 1530 Last data filed at 06/21/2019 0906 Gross per 24 hour  Intake 347.57 ml  Output 1600 ml  Net -1252.43  ml     Physical Exam Gen Exam:Alert awake-not in any distress HEENT:atraumatic, normocephalic Chest: B/L clear to auscultation anteriorly CVS:S1S2 regular Abdomen:soft non tender, non distended Extremities:no edema Neurology: Non focal Skin: no rash   Data Review:    CBC Recent Labs  Lab 06/15/19 1004 06/19/19 0341 06/20/19 0001 06/21/19 0715  WBC 11.8* 6.7 7.3  5.9  HGB 14.9 12.7* 13.0 12.5*  HCT 43.8 38.5* 39.9 37.5*  PLT 188 109* 99* 101*  MCV 95.2 94.8 96.4 94.9  MCH 32.4 31.3 31.4 31.6  MCHC 34.0 33.0 32.6 33.3  RDW 14.6 14.8 15.0 14.9    Chemistries  Recent Labs  Lab 06/15/19 1004 06/16/19 0836 06/18/19 1645 06/19/19 0341 06/20/19 0001 06/21/19 0715  NA 132* 133* 131* 135 135 133*  K 4.4 4.4 4.0 4.1 4.0 4.1  CL 96* 95* 98 102 98 95*  CO2 _0 GLUCOSE 150* 107* 304* 107* 95 170*  BUN 42* 33* 26* 21 19 24*  CREATININE 1.68* 1.57* 1.42* 1.23 1.28* 1.42*  CALCIUM 7.8* 7.9* 7.5* 8.1* 8.1* 8.1*  AST 32  --  _1 --   ALT 59*  --  _2 --   ALKPHOS 33*  --  30* 28* 32*  --   BILITOT 1.7*  --  1.3* 0.8 1.1  --    ------------------------------------------------------------------------------------------------------------------ No results for input(s): CHOL, HDL, LDLCALC, TRIG, CHOLHDL, LDLDIRECT in the last 72 hours.  Lab Results  Component Value Date   HGBA1C 7.5 (H) 05/31/2019   ------------------------------------------------------------------------------------------------------------------ No results for input(s): TSH, T4TOTAL, T3FREE, THYROIDAB in the last 72 hours.  Invalid input(s): FREET3 ------------------------------------------------------------------------------------------------------------------ No results for input(s): VITAMINB12, FOLATE, FERRITIN, TIBC, IRON, RETICCTPCT in the last 72 hours.  Coagulation profile No results for input(s): INR, PROTIME in the last 168 hours.  No results for input(s): DDIMER in the last 72 hours.  Cardiac Enzymes No results for input(s): CKMB, TROPONINI, MYOGLOBIN in the last 168 hours.  Invalid input(s): CK ------------------------------------------------------------------------------------------------------------------    Component Value Date/Time   BNP 133.1 (H) 06/19/2019 0341    Micro Results No results found for this or any previous visit  (from the past 240 hour(s)).  Radiology Reports Ct Abdomen Pelvis Wo Contrast  Result Date: 06/17/2019 CLINICAL DATA:  Abdominal distension. EXAM: CT ABDOMEN AND PELVIS WITHOUT CONTRAST TECHNIQUE: Multidetector CT imaging of the abdomen and pelvis was performed following the standard protocol without IV contrast. COMPARISON:  Chest x-ray May 31, 2019. CT abdomen and pelvis May 31, 2019. FINDINGS: Lower chest: There are patchy infiltrates in the bases and right middle lobe which are new in the interval, worrisome for an infectious process. Atypical infections should be considered. Lung bases are otherwise normal. Lower chest is otherwise normal. Hepatobiliary: A low-attenuation lesion in the right hepatic lobe on series 2, image 17 is stable and remains too small to characterize. Statistically, this is most likely a small benign lesion such as a cyst. No other liver abnormalities are identified. The gallbladder is normal. The portal veins not assessed due to lack of contrast. Pancreas: Unremarkable. No pancreatic ductal dilatation or surrounding inflammatory changes. Spleen: Normal in size without focal abnormality. Adrenals/Urinary Tract: Bilateral perinephric stranding is stable and likely nonacute. The kidneys and ureters are otherwise normal. There is a Foley catheter in the bladder. The bladder is partially decompressed. There is air in the bladder is well, likely due to the Foley catheter. The fluid in the bladder  demonstrates an attenuation of 49 Hounsfield units. Stomach/Bowel: Stomach is within normal limits. Appendix appears normal. No evidence of bowel wall thickening, distention, or inflammatory changes. Vascular/Lymphatic: Calcified atherosclerosis is seen in the nonaneurysmal aorta. No adenopathy. Reproductive: Prostate is unremarkable. Other: No abdominal wall hernia or abnormality. No abdominopelvic ascites. Musculoskeletal: No acute or significant osseous findings. IMPRESSION: 1.  Patchy infiltrates in the lung bases and right middle lobe are new in the interval, worrisome for an infectious process. Atypical infections should be considered. 2. The bladder is partially decompressed with a Foley catheter. The fluid in the bladder demonstrates an attenuation of 49 Hounsfield units. This could represent blood products. Recommend clinical correlation. 3. Atherosclerosis in the nonaneurysmal aorta. Aortic Atherosclerosis (ICD10-I70.0). Electronically Signed   By: Dorise Bullion III M.D   On: 06/17/2019 11:22   Ct Abdomen Pelvis Wo Contrast  Result Date: 05/31/2019 CLINICAL DATA:  Weight loss, unintended, non-localized Abdominal pain, nausea vomiting and diarrhea. Shortness of breath. Fevers and chills over the last 2 days. Hypoxia EXAM: CT ABDOMEN AND PELVIS WITHOUT CONTRAST TECHNIQUE: Multidetector CT imaging of the abdomen and pelvis was performed following the standard protocol without IV contrast. COMPARISON:  CT of the abdomen and pelvis 11/21/2017 FINDINGS: Lower chest: Scarring of the lingula and right middle lobe is stable. Minimal dependent atelectasis is present. Heart size is normal. Hepatobiliary: There is diffuse fatty infiltration of the liver. No discrete lesions are present. The common bile duct and gallbladder are normal. Pancreas: Unremarkable. No pancreatic ductal dilatation or surrounding inflammatory changes. Spleen: Normal in size without focal abnormality. Adrenals/Urinary Tract: Adrenal glands are normal bilaterally. Kidneys and ureters are within normal limits. There is no stone or mass lesion. No obstruction is present. The urinary bladder is distended. No focal abnormality is present. Stomach/Bowel: The stomach and duodenum are within normal limits. Small bowel is unremarkable. Terminal ileum is within normal limits. Appendix is visualized and normal. The ascending and transverse colon are normal. The descending and sigmoid colon are within normal limits.  Vascular/Lymphatic: Atherosclerotic calcifications are present in the aorta and branch vessels 50% narrowing is present at the origin of the SMA. Moderate stenosis is present at the origin of the IMA. Dense atherosclerotic calcifications are present in the proximal iliac arteries bilaterally. Reproductive: Prostate is unremarkable. Other: No abdominal wall hernia or abnormality. No abdominopelvic ascites. Musculoskeletal: Advanced degenerative changes are present in the lower lumbar spine. No acute abnormalities are present. Bony pelvis is within normal limits. The hips are located and within normal limits. IMPRESSION: 1. No acute or focal lesion to explain the patient's symptoms. 2. Hepatic steatosis. 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 4. Aortic Atherosclerosis (ICD10-I70.0). Electronically Signed   By: San Morelle M.D.   On: 05/31/2019 07:11   Dg Abd 1 View  Result Date: 06/08/2019 CLINICAL DATA:  Constipation EXAM: ABDOMEN - 1 VIEW COMPARISON:  CT abdomen pelvis-05/31/2019; chest radiograph-earlier same day FINDINGS: Moderate gaseous distention of the colon without upstream distension of the small bowel. Unremarkable colonic stool burden. Nondiagnostic evaluation for pneumoperitoneum secondary to supine positioning and exclusion of the lower thorax. No pneumatosis or portal venous gas. No definitive abnormal intra-abdominal calcifications. Degenerative change the lower lumbar spine and bilateral hips is suspected though incompletely evaluated. IMPRESSION: 1. Mild gaseous distention of the colon without evidence of enteric obstruction. 2. Unremarkable colonic stool burden. Electronically Signed   By: Sandi Mariscal M.D.   On: 06/08/2019  08:33   Nm Pulmonary Perf And Vent  Result Date: 06/01/2019 CLINICAL DATA:  Hypoxemia, shortness of breath since Tuesday, COPD, asthma, question pulmonary embolism EXAM: NUCLEAR MEDICINE VENTILATION - PERFUSION  LUNG SCAN TECHNIQUE: Ventilation images were obtained in multiple projections using inhaled aerosol Tc-30mDTPA. Perfusion images were obtained in multiple projections after intravenous injection of Tc-929mAA. RADIOPHARMACEUTICALS:  31.98 mCi of Tc-9951mPA aerosol inhalation and 4.12 mCi Tc99m36m IV COMPARISON:  None Correlation: Chest radiograph 05/31/2019 FINDINGS: Ventilation: Overall did irregular diminished ventilation within the upper lobes. Focal ventilatory defect lateral aspect RIGHT upper lobe. Prominent LEFT hilum. Perfusion: Matching perfusion defect lateral RIGHT upper lobe. Remaining perfusion normal. No additional segmental or subsegmental perfusion defects. Chest radiograph: Scattered interstitial prominence in the periphery of the lungs bilaterally greater in RIGHT upper lobe at site of ventilation and perfusion abnormality. IMPRESSION: Low probability for pulmonary embolism. Electronically Signed   By: MarkLavonia Dana.   On: 06/01/2019 08:21   Dg Chest Port Am  Result Date: 06/19/2019 CLINICAL DATA:  Shortness of breath EXAM: PORTABLE CHEST 1 VIEW COMPARISON:  May 31, 2019. FINDINGS: There is cardiomegaly with pulmonary venous hypertension. There is diffuse interstitial prominence in the periphery of each lung, progressed from recent study. There is no frank airspace consolidation. There are small pleural effusions bilaterally. There is aortic atherosclerosis. No adenopathy. Bones appear osteoporotic. IMPRESSION: Cardiomegaly with pulmonary vascular congestion. Small pleural effusions bilaterally. Diffuse interstitial prominence in the periphery of each lung, likely interstitial edema superimposed on underlying chronic fibrosis. There is felt to likely be a degree of congestive heart failure superimposed on fibrosis. No frank airspace consolidation. Aortic Atherosclerosis (ICD10-I70.0). Electronically Signed   By: WillLowella Grip M.D.   On: 06/19/2019 07:59   Dg Chest Portable 1  View  Result Date: 05/31/2019 CLINICAL DATA:  Shortness of breath EXAM: PORTABLE CHEST 1 VIEW COMPARISON:  January 05, 2016 FINDINGS: The heart size and mediastinal contours are within normal limits. Aortic knob calcifications are seen. There is minimally increased interstitial markings seen at the periphery. There is bibasilar subsegmental atelectasis. No large airspace consolidation or pleural effusion. No acute osseous abnormality. IMPRESSION: Mildly increased interstitial markings which could be due to interstitial edema. Electronically Signed   By: BindPrudencio Pair.   On: 05/31/2019 03:52   Dg Abd Portable 2v  Result Date: 06/15/2019 CLINICAL DATA:  Vomiting. EXAM: PORTABLE ABDOMEN - 2 VIEW COMPARISON:  Abdominal radiograph dated 06/08/2019 and chest x-ray dated 05/31/2019 FINDINGS: The bowel gas pattern is normal. No dilated loops of large or small bowel. No free air in the abdomen. No acute bone abnormality. The patient appears to have hazy peripheral infiltrates at the lung bases which appear to be new since 05/31/2019. IMPRESSION: 1. Benign-appearing abdomen. 2. There appear to be new peripheral infiltrates at the left and right lung bases. Electronically Signed   By: JameLorriane Shire.   On: 06/15/2019 09:31

## 2019-06-22 ENCOUNTER — Encounter (HOSPITAL_COMMUNITY)
Admission: AD | Disposition: A | Discharge status: Home Health | Payer: Self-pay | Source: Other Acute Inpatient Hospital | Attending: Internal Medicine

## 2019-06-22 ENCOUNTER — Inpatient Hospital Stay (HOSPITAL_COMMUNITY): Payer: Medicare HMO | Admitting: Anesthesiology

## 2019-06-22 ENCOUNTER — Other Ambulatory Visit: Payer: Self-pay

## 2019-06-22 ENCOUNTER — Encounter (HOSPITAL_COMMUNITY): Payer: Self-pay | Admitting: *Deleted

## 2019-06-22 HISTORY — PX: CARDIOVERSION: SHX1299

## 2019-06-22 LAB — GLUCOSE, CAPILLARY
Glucose-Capillary: 114 mg/dL — ABNORMAL HIGH (ref 70–99)
Glucose-Capillary: 134 mg/dL — ABNORMAL HIGH (ref 70–99)
Glucose-Capillary: 212 mg/dL — ABNORMAL HIGH (ref 70–99)
Glucose-Capillary: 221 mg/dL — ABNORMAL HIGH (ref 70–99)
Glucose-Capillary: 177 mg/dL — ABNORMAL HIGH (ref 70–99)

## 2019-06-22 LAB — COMPREHENSIVE METABOLIC PANEL
ALT: 24 U/L (ref 0–44)
AST: 24 U/L (ref 15–41)
Albumin: 2.2 g/dL — ABNORMAL LOW (ref 3.5–5.0)
Alkaline Phosphatase: 32 U/L — ABNORMAL LOW (ref 38–126)
Anion gap: 9 (ref 5–15)
BUN: 21 mg/dL (ref 8–23)
CO2: 27 mmol/L (ref 22–32)
Calcium: 8.1 mg/dL — ABNORMAL LOW (ref 8.9–10.3)
Chloride: 96 mmol/L — ABNORMAL LOW (ref 98–111)
Creatinine, Ser: 1.41 mg/dL — ABNORMAL HIGH (ref 0.61–1.24)
GFR calc Af Amer: 56 mL/min — ABNORMAL LOW (ref >60)
GFR calc non Af Amer: 48 mL/min — ABNORMAL LOW (ref >60)
Glucose, Bld: 127 mg/dL — ABNORMAL HIGH (ref 70–99)
Potassium: 4 mmol/L (ref 3.5–5.1)
Sodium: 132 mmol/L — ABNORMAL LOW (ref 135–145)
Total Bilirubin: 1.1 mg/dL (ref 0.3–1.2)
Total Protein: 5.5 g/dL — ABNORMAL LOW (ref 6.5–8.1)

## 2019-06-22 LAB — CBC
HCT: 36 % — ABNORMAL LOW (ref 39.0–52.0)
Hemoglobin: 12.3 g/dL — ABNORMAL LOW (ref 13.0–17.0)
MCH: 31.9 pg (ref 26.0–34.0)
MCHC: 34.2 g/dL (ref 30.0–36.0)
MCV: 93.3 fL (ref 80.0–100.0)
Platelets: 91 10*3/uL — ABNORMAL LOW (ref 150–400)
RBC: 3.86 MIL/uL — ABNORMAL LOW (ref 4.22–5.81)
RDW: 14.8 % (ref 11.5–15.5)
WBC: 5.8 10*3/uL (ref 4.0–10.5)
nRBC: 0 % (ref 0.0–0.2)

## 2019-06-22 SURGERY — CARDIOVERSION
Anesthesia: General

## 2019-06-22 MED ORDER — ALBUTEROL SULFATE (2.5 MG/3ML) 0.083% IN NEBU: 2.5000 mg | INHALATION_SOLUTION | Freq: Three times a day (TID) | RESPIRATORY_TRACT | Status: DC

## 2019-06-22 MED ORDER — SODIUM CHLORIDE 0.9 % IV SOLN
INTRAVENOUS | Status: DC | PRN
  Administered 2019-06-22: 11:00:00 via INTRAVENOUS

## 2019-06-22 MED ORDER — ALBUTEROL SULFATE HFA 108 (90 BASE) MCG/ACT IN AERS
2.0000 | INHALATION_SPRAY | Freq: Three times a day (TID) | RESPIRATORY_TRACT | Status: DC
  Administered 2019-06-22 – 2019-06-24 (×7): 2 via RESPIRATORY_TRACT

## 2019-06-22 NOTE — Anesthesia Postprocedure Evaluation (Signed)
Anesthesia Post Note  Patient: Jay Morris  Procedure(s) Performed: CARDIOVERSION (N/A )     Patient location during evaluation: Endoscopy Anesthesia Type: General Level of consciousness: awake and alert Pain management: pain level controlled Vital Signs Assessment: post-procedure vital signs reviewed and stable Respiratory status: spontaneous breathing, nonlabored ventilation, respiratory function stable and patient connected to nasal cannula oxygen Cardiovascular status: blood pressure returned to baseline and stable Postop Assessment: no apparent nausea or vomiting Anesthetic complications: no    Last Vitals:  Vitals:   06/22/19 1230 06/22/19 1515  BP: 127/63   Pulse: 64 67  Resp: 18 19  Temp: 36.4 C   SpO2: 95% 91%    Last Pain:  Vitals:   06/22/19 1230  TempSrc: Oral  PainSc:                  Bastien Strawser P Diahn Waidelich

## 2019-06-22 NOTE — Progress Notes (Signed)
Pt received from endoscopy. VSS. Pt in NSR with a rate of 66. Call light in reach. Will continue to monitor.  Clyde Canterbury, RN

## 2019-06-22 NOTE — Progress Notes (Signed)
Physical Therapy Treatment Patient Details Name: Jay Morris MRN: [id number] DOB: 31-Jul-1945 Today's Date: 06/22/2019    History of Present Illness Pt is a 73 y.o. male with a known history of asthma, atrial fibrillation, COPD, type 2 diabetes mellitus, hypertension and dyslipidemia, presented to the emergency room with acute onset of 2-day history of fever with a T-max of 103 and chills with associated body aching, generalized weakness, fatigue and malaise.  He admitted to dyspnea with paroxysmal nocturnal dyspnea and dyspnea on exertion without orthopnea or lower extremity edema.  He admitted to nausea and vomiting as well as watery diarrhea.  He admitted to mild epigastric and infraumbilical abdominal pain.  He lost about 10 pounds during the last 4 days.  He had mild dizziness without headache or diplopia.  Upon presentation to the emergency room, temperature was 99.3 respiratory to 25, heart rate 82, blood pressure 150/66 and pulse oximetry 91% on room air dropped to 82% upon ambulation for short distance.  CBC showed anemia with hemoglobin of 12.5 hematocrit of 37.1 better than his previous levels and very close to his baseline.  For which x-ray showed mild increase interstitial markings which could be due to interstitial pulmonary edema.  He will be admitted to a medical monitored bed for further evaluation and management.  MD assessment includes: Acute kidney injury superimposed on chronic kidney disease stage IIIa, N&V, diarrhea, DM II with hyperglycemia, A-fib, and general weakness; he was found to have acute hypoxic respiratory failure secondary to COVID-19 pneumonia.  Hospital course was complicated by progressively worsening hypoxemia requiring 15 L of high flow oxygen and A. fib with RVR.    PT Comments    Pt limited by fatigue and desaturation during ambulation. Pt continues to remain limited in ambulation tolerance and requires PT physical support during OOB activity to aide in  stability. As documented below pt desaturating into mid 60s on room air during ambulation, returned to room and recovering with aide of supplemental oxygen. Pt is at a high falls risk due to quick desaturation and onset of SOB as well as poor activity tolerance. PT continues to recommend SNF due to increased falls risk however pt and family preferring discharge home with HHPT at this time. Pt will benefit from receiving RW for household mobility and Wheelchair for community mobility due to endurance deficits upon discharge.  Follow Up Recommendations  SNF     Equipment Recommendations  Rolling walker with 5" wheels;Wheelchair (measurements PT)    Recommendations for Other Services       Precautions / Restrictions Precautions Precautions: Fall Precaution Comments: orthostasis Restrictions Weight Bearing Restrictions: No    Mobility  Bed Mobility Overal bed mobility: Needs Assistance Bed Mobility: Supine to Sit;Sit to Supine     Supine to sit: Supervision;HOB elevated Sit to supine: Supervision;HOB elevated      Transfers Overall transfer level: Needs assistance Equipment used: 1 person hand held assist Transfers: Sit to/from Stand Sit to Stand: Min guard            Ambulation/Gait Ambulation/Gait assistance: Min guard Gait Distance (Feet): 60 Feet Assistive device: 1 person hand held assist Gait Pattern/deviations: Step-through pattern Gait velocity: reduced Gait velocity interpretation: <1.8 ft/sec, indicate of risk for recurrent falls General Gait Details: pt with increased lateral sway, utilizing stepping strategy and HHA to correct for minor LOB.   Stairs             Wheelchair Mobility    Modified Rankin (Stroke Patients Only)  Balance Overall balance assessment: Needs assistance Sitting-balance support: No upper extremity supported;Feet supported Sitting balance-Leahy Scale: Good Sitting balance - Comments: supervision   Standing  balance support: Single extremity supported Standing balance-Leahy Scale: Fair Standing balance comment: minG-minA with HHA                            Cognition Arousal/Alertness: Awake/alert Behavior During Therapy: WFL for tasks assessed/performed Overall Cognitive Status: Within Functional Limits for tasks assessed                                        Exercises      General Comments General comments (skin integrity, edema, etc.): pt on 2L [place] upon PT arrival, saturating well. PT weans oxygen to room air with pt saturating well during bed mobility,transfer, and initial steps of ambulation. during ambulation pt desaturates to 88%, taking standing rest break with reported SOB, pt continues to endorse SOB with SpO2 abruptly dropping from high 80s to 66%. Pt returned to room and placed back on supplemental oxygen by PT, requiring 1-2 minutes to report resolution of symptoms while on 4L [place], and 1-2 more minutes on 2L [place] to return saturation to 90%. Pt left resting in bed, asymptomatic, on 2L [place]. HR remains stable during ambulation      Pertinent Vitals/Pain Pain Assessment: No/denies pain    Home Living                      Prior Function            PT Goals (current goals can now be found in the care plan section) Acute Rehab PT Goals Patient Stated Goal: states is feeling much better and that it feels good to be walking again Progress towards PT goals: Progressing toward goals    Frequency    Min 2X/week      PT Plan Current plan remains appropriate    Co-evaluation              AM-PAC PT "6 Clicks" Mobility   Outcome Measure  Help needed turning from your back to your side while in a flat bed without using bedrails?: A Little Help needed moving from lying on your back to sitting on the side of a flat bed without using bedrails?: A Little Help needed moving to and from a bed to a chair (including a wheelchair)?: A  Little Help needed standing up from a chair using your arms (e.g., wheelchair or bedside chair)?: A Little Help needed to walk in hospital room?: A Little Help needed climbing 3-5 steps with a railing? : Total 6 Click Score: 16    End of Session Equipment Utilized During Treatment: Oxygen Activity Tolerance: Patient limited by fatigue;Treatment limited secondary to medical complications (Comment)(desaturation) Patient left: in bed;with call bell/phone within reach;with family/visitor present Nurse Communication: Mobility status PT Visit Diagnosis: Difficulty in walking, not elsewhere classified (R26.2);Muscle weakness (generalized) (M62.81)     Time: 6629-4765 PT Time Calculation (min) (ACUTE ONLY): 24 min  Charges:  $Gait Training: 8-22 mins $Therapeutic Activity: 8-22 mins                     Zenaida Niece, PT, DPT Acute Rehabilitation Pager: [phone]    Zenaida Niece 06/22/2019, 3:03 PM

## 2019-06-22 NOTE — TOC Progression Note (Signed)
Transition of Care [facility]) - Progression Note    Patient Details  Name: Jay Morris MRN: [id number] Date of Birth: Mar 14, 1946  Transition of Care [facility]) CM/SW [place], [place] Phone Number: 06/22/2019, 2:44 PM  Clinical Narrative:     CSW  Spoke with patient's daughter, Ivin Booty- she confirmed at this time the family prefers home with home health. No home health preference. CSW advised to visit medicare.gov listing of [place] agencies. RNCM updated.   Thurmond Butts, MSW, [facility] Clinical Social Worker [phone]   Expected Discharge Plan: Skilled Nursing Facility Barriers to Discharge: Insurance Authorization  Expected Discharge Plan and Services Expected Discharge Plan: [place]   Discharge Planning Services: CM Consult Post Acute Care Choice: [place] Living arrangements for the past 2 months: Single Family Home Expected Discharge Date: 06/20/19               DME Arranged: N/A DME Agency: NA       HH Arranged: NA HH Agency: NA         Social Determinants of Health (SDOH) Interventions    Readmission Risk Interventions No flowsheet data found.

## 2019-06-22 NOTE — Progress Notes (Signed)
Cardiology Progress Note  Patient ID: Jay Morris MRN: [id number] DOB: 08-24-1945 Date of Encounter: 06/22/2019  Primary Cardiologist: No primary care provider on file.  Subjective  Admitted overnight due to difficult to control atrial fibrillation/flutter at [facility].  He is 3 weeks since his initial Covid diagnosis.  He is doing well from that standpoint.  They were having difficulty controlling his atrial fibrillation on amiodarone and multiple AV nodal agents.  He appears to be in rate controlled flutter this morning.  N.p.o. plans for cardioversion today.  He has not missed any doses of Pradaxa in 3 weeks.  I did confirm this with him and also for medication review of his chart.  No complaints this morning.  ROS:  All other ROS reviewed and negative. Pertinent positives noted in the HPI.     Inpatient Medications  Scheduled Meds: . albuterol  2 puff Inhalation TID  . allopurinol  100 mg Oral Daily  . atorvastatin  20 mg Oral q1800  . Chlorhexidine Gluconate Cloth  6 each Topical Daily  . dabigatran  150 mg Oral BID  . finasteride  5 mg Oral Daily  . fluticasone furoate-vilanterol  1 puff Inhalation Daily  . furosemide  40 mg Intravenous Daily  . insulin aspart  0-15 Units Subcutaneous TID WC  . insulin aspart  2-5 Units Subcutaneous QHS  . insulin aspart  4 Units Subcutaneous TID WC  . insulin glargine  20 Units Subcutaneous QHS  . metoprolol tartrate  50 mg Oral Q6H  . midodrine  2.5 mg Oral BID WC  . pantoprazole  40 mg Oral BID AC  . polyethylene glycol  17 g Oral TID  . senna  2 tablet Oral QHS  . sucralfate  1 g Oral TID WC & HS  . tamsulosin  0.4 mg Oral QHS  . umeclidinium bromide  1 puff Inhalation Daily   Continuous Infusions: . amiodarone 30 mg/hr (06/22/19 0247)   PRN Meds: acetaminophen, alum & mag hydroxide-simeth, bisacodyl, chlorpheniramine-HYDROcodone, cyclobenzaprine, diphenhydrAMINE, guaiFENesin-dextromethorphan, lactulose, metoprolol tartrate,  ondansetron **OR** ondansetron (ZOFRAN) IV, promethazine, sodium chloride   Vital Signs   Vitals:   06/22/19 0026 06/22/19 0424 06/22/19 0757 06/22/19 0835  BP: (!) 95/51 (!) 86/48 (!) 99/50   Pulse: 68 67 66 66  Resp: _0 (!) 29  Temp: 98.2 F (36.8 C) 98.5 F (36.9 C) 97.6 F (36.4 C)   TempSrc: Oral Oral Oral   SpO2: 90% 94% 94% 95%  Weight:      Height:        Intake/Output Summary (Last 24 hours) at 06/22/2019 0902 Last data filed at 06/22/2019 0600 Gross per 24 hour  Intake 434.05 ml  Output 1200 ml  Net -765.95 ml   Last 3 Weights 06/21/2019 06/02/2019 06/01/2019  Weight (lbs) 172 lb 3.2 oz 189 lb 2.5 oz 186 lb 4.6 oz  Weight (kg) 78.109 kg 85.8 kg 84.5 kg      Telemetry  Overnight telemetry shows likely atrial flutter which is now well rate controlled in the 60s, which I personally reviewed.   ECG  The most recent ECG shows atrial flutter which is likely atypical heart rate 66, which I personally reviewed.   Physical Exam   Vitals:   06/22/19 0026 06/22/19 0424 06/22/19 0757 06/22/19 0835  BP: (!) 95/51 (!) 86/48 (!) 99/50   Pulse: 68 67 66 66  Resp: _1 (!) 29  Temp: 98.2 F (36.8 C) 98.5 F (36.9 C) 97.6  F (36.4 C)   TempSrc: Oral Oral Oral   SpO2: 90% 94% 94% 95%  Weight:      Height:         Intake/Output Summary (Last 24 hours) at 06/22/2019 0902 Last data filed at 06/22/2019 0600 Gross per 24 hour  Intake 434.05 ml  Output 1200 ml  Net -765.95 ml    Last 3 Weights 06/21/2019 06/02/2019 06/01/2019  Weight (lbs) 172 lb 3.2 oz 189 lb 2.5 oz 186 lb 4.6 oz  Weight (kg) 78.109 kg 85.8 kg 84.5 kg    Body mass index is 26.18 kg/m.  General: Well nourished, well developed, in no acute distress Head: Atraumatic, normal size  Eyes: PEERLA, EOMI  Neck: Supple, no JVD Endocrine: No thryomegaly Cardiac: Normal S1, S2; RRR; no murmurs, rubs, or gallops Lungs: Diffuse rales bilaterally Abd: Soft, nontender, no hepatomegaly  Ext: No edema,  pulses 2+ Musculoskeletal: No deformities, BUE and BLE strength normal and equal Skin: Warm and dry, no rashes   Neuro: Alert and oriented to person, place, time, and situation, CNII-XII grossly intact, no focal deficits  Psych: Normal mood and affect   Labs  High Sensitivity Troponin:   Recent Labs  Lab 05/31/19 0329 05/31/19 0534 05/31/19 1039  TROPONINIHS 18* 17 14     Cardiac EnzymesNo results for input(s): TROPONINI in the last 168 hours. No results for input(s): TROPIPOC in the last 168 hours.  Chemistry Recent Labs  Lab 06/18/19 1645 06/19/19 0341 06/20/19 0001 06/21/19 0715  NA 131* 135 135 133*  K 4.0 4.1 4.0 4.1  CL 98 102 98 95*  CO2 _0 GLUCOSE 304* 107* 95 170*  BUN 26* 21 19 24*  CREATININE 1.42* 1.23 1.28* 1.42*  CALCIUM 7.5* 8.1* 8.1* 8.1*  PROT 5.2* 5.2* 5.6*  --   ALBUMIN 2.3* 2.3* 2.4*  --   AST _1 --   ALT _2 --   ALKPHOS 30* 28* 32*  --   BILITOT 1.3* 0.8 1.1  --   GFRNONAA 48* 57* 54* 48*  GFRAA 56* >60 >60 56*  ANIONGAP _3 Hematology Recent Labs  Lab 06/20/19 0001 06/21/19 0715 06/22/19 0753  WBC 7.3 5.9 5.8  RBC 4.14* 3.95* 3.86*  HGB 13.0 12.5* 12.3*  HCT 39.9 37.5* 36.0*  MCV 96.4 94.9 93.3  MCH 31.4 31.6 31.9  MCHC 32.6 33.3 34.2  RDW 15.0 14.9 14.8  PLT 99* 101* 91*   BNP Recent Labs  Lab 06/19/19 0341  BNP 133.1*    DDimer No results for input(s): DDIMER in the last 168 hours.   Radiology  No results found.  Cardiac Studies  TTE 05/31/2019  1. Left ventricular ejection fraction, by visual estimation, is 60 to 65%. The left ventricle has normal function. Left ventricular septal wall thickness was mildly increased. Mildly increased left ventricular posterior wall thickness. There is mildly  increased left ventricular hypertrophy.  2. Global right ventricle has normal systolic function.The right ventricular size is mildly enlarged. No increase in right ventricular wall thickness.  3.  Left atrial size was normal.  4. Right atrial size was mildly dilated.  5. The mitral valve was not well visualized. Trace mitral valve regurgitation.  6. The tricuspid valve is not well visualized. Tricuspid valve regurgitation is mild.  7. The aortic valve is grossly normal. Aortic valve regurgitation is trivial.  8. The pulmonic valve was not well visualized.  Pulmonic valve regurgitation is trivial.  9. The aortic root was not well visualized. 10. Mildly elevated pulmonary artery systolic pressure. 11. The atrial septum is grossly normal.   Patient Profile  Jay Morris is a 73 y.o. male with COPD, paroxysmal atrial fibrillation status post cardiac ablation at [facility] 20 years ago, on anticoagulation with no interrupted dosing, diabetes, hypertension who was initially admitted to [facility] on 11/14 for Covid pneumonia.  His course there was complicated by A. fib with RVR as well as atrial flutter.  He was transferred here on 12/3 for management of his A. fib.  Assessment & Plan   1.  Atrial fibrillation/atrial flutter: He developed this in the setting of Covid pneumonia on 11/14.  He has been at [facility] and has recovered from his Covid pneumonia.  He is on 2 L oxygen.  He was initially on amiodarone IV diltiazem as well as oral metoprolol at [facility].  His course was complicated by bradycardia with this.  He has been maintained on IV amiodarone and metoprolol for the past few days.  He appears to be an atypical atrial flutter on my review of his EKG and telemetry.  He is rate controlled in the 60s.  Given that he has had rather difficult to control rates and per review of chart Esmond Plants is tend to be more of an atypical atrial flutter, we will proceed with cardioversion today.  He has missed no doses of Pradaxa in the past 3 weeks on my review of his medications.  He is cleared from infection control standpoint to undergo procedures per infectious disease and oncology [facility].   He has received a few days of IV amiodarone.  After his cardioversion we will plan to transition to 400 mg twice daily for 7 days and then 200 mg daily for 21 days.  We will plan to complete a full month of therapy and then stop.  Amiodarone will not be a long-term therapy for him. We will also continue his home beta-blocker.  He will continue home Pradaxa at discharge and after cardioversion.  For questions or updates, please contact [place] Please consult [email or web address] for contact info under   Signed, [place] T. Audie Box, [place]  06/22/2019 9:02 AM

## 2019-06-22 NOTE — Anesthesia Preprocedure Evaluation (Addendum)
Anesthesia Evaluation  Patient identified by MRN, date of birth, ID band Patient awake    Reviewed: Allergy & Precautions, NPO status , Patient's Chart, lab work & pertinent test results  Airway Mallampati: III       Dental  (+) Edentulous Upper, Missing,    Pulmonary asthma , COPD,  COPD inhaler, former smoker,    Pulmonary exam normal        Cardiovascular hypertension, Pt. on home beta blockers + dysrhythmias Atrial Fibrillation  Rhythm:Irregular Rate:Normal  ECG: rate 66. Accelerated Junctional rhythm ST & T wave abnormality, consider lateral ischemia   Neuro/Psych negative neurological ROS  negative psych ROS   GI/Hepatic negative GI ROS, Neg liver ROS,   Endo/Other  diabetes, Insulin Dependent, Oral Hypoglycemic Agents  Renal/GU negative Renal ROS     Musculoskeletal Gout   Abdominal   Peds  Hematology  (+) anemia , HLD Thrombocytopenia   Anesthesia Other Findings A-fib  Reproductive/Obstetrics                            Anesthesia Physical Anesthesia Plan  ASA: III  Anesthesia Plan: General   Post-op Pain Management:    Induction:   PONV Risk Score and Plan: 2 and Propofol infusion and Treatment may vary due to age or medical condition  Airway Management Planned: Mask  Additional Equipment:   Intra-op Plan:   Post-operative Plan:   Informed Consent: I have reviewed the patients History and Physical, chart, labs and discussed the procedure including the risks, benefits and alternatives for the proposed anesthesia with the patient or authorized representative who has indicated his/her understanding and acceptance.     Dental advisory given  Plan Discussed with: CRNA  Anesthesia Plan Comments:        Anesthesia Quick Evaluation

## 2019-06-22 NOTE — H&P (View-Only) (Signed)
Cardiology Progress Note  Patient ID: Jay Morris MRN: [id number] DOB: Jul 06, 1946 Date of Encounter: 06/22/2019  Primary Cardiologist: No primary care provider on file.  Subjective  Admitted overnight due to difficult to control atrial fibrillation/flutter at [facility].  He is 3 weeks since his initial Covid diagnosis.  He is doing well from that standpoint.  They were having difficulty controlling his atrial fibrillation on amiodarone and multiple AV nodal agents.  He appears to be in rate controlled flutter this morning.  N.p.o. plans for cardioversion today.  He has not missed any doses of Pradaxa in 3 weeks.  I did confirm this with him and also for medication review of his chart.  No complaints this morning.  ROS:  All other ROS reviewed and negative. Pertinent positives noted in the HPI.     Inpatient Medications  Scheduled Meds: . albuterol  2 puff Inhalation TID  . allopurinol  100 mg Oral Daily  . atorvastatin  20 mg Oral q1800  . Chlorhexidine Gluconate Cloth  6 each Topical Daily  . dabigatran  150 mg Oral BID  . finasteride  5 mg Oral Daily  . fluticasone furoate-vilanterol  1 puff Inhalation Daily  . furosemide  40 mg Intravenous Daily  . insulin aspart  0-15 Units Subcutaneous TID WC  . insulin aspart  2-5 Units Subcutaneous QHS  . insulin aspart  4 Units Subcutaneous TID WC  . insulin glargine  20 Units Subcutaneous QHS  . metoprolol tartrate  50 mg Oral Q6H  . midodrine  2.5 mg Oral BID WC  . pantoprazole  40 mg Oral BID AC  . polyethylene glycol  17 g Oral TID  . senna  2 tablet Oral QHS  . sucralfate  1 g Oral TID WC & HS  . tamsulosin  0.4 mg Oral QHS  . umeclidinium bromide  1 puff Inhalation Daily   Continuous Infusions: . amiodarone 30 mg/hr (06/22/19 0247)   PRN Meds: acetaminophen, alum & mag hydroxide-simeth, bisacodyl, chlorpheniramine-HYDROcodone, cyclobenzaprine, diphenhydrAMINE, guaiFENesin-dextromethorphan, lactulose, metoprolol tartrate,  ondansetron **OR** ondansetron (ZOFRAN) IV, promethazine, sodium chloride   Vital Signs   Vitals:   06/22/19 0026 06/22/19 0424 06/22/19 0757 06/22/19 0835  BP: (!) 95/51 (!) 86/48 (!) 99/50   Pulse: 68 67 66 66  Resp: _0 (!) 29  Temp: 98.2 F (36.8 C) 98.5 F (36.9 C) 97.6 F (36.4 C)   TempSrc: Oral Oral Oral   SpO2: 90% 94% 94% 95%  Weight:      Height:        Intake/Output Summary (Last 24 hours) at 06/22/2019 0902 Last data filed at 06/22/2019 0600 Gross per 24 hour  Intake 434.05 ml  Output 1200 ml  Net -765.95 ml   Last 3 Weights 06/21/2019 06/02/2019 06/01/2019  Weight (lbs) 172 lb 3.2 oz 189 lb 2.5 oz 186 lb 4.6 oz  Weight (kg) 78.109 kg 85.8 kg 84.5 kg      Telemetry  Overnight telemetry shows likely atrial flutter which is now well rate controlled in the 60s, which I personally reviewed.   ECG  The most recent ECG shows atrial flutter which is likely atypical heart rate 66, which I personally reviewed.   Physical Exam   Vitals:   06/22/19 0026 06/22/19 0424 06/22/19 0757 06/22/19 0835  BP: (!) 95/51 (!) 86/48 (!) 99/50   Pulse: 68 67 66 66  Resp: _1 (!) 29  Temp: 98.2 F (36.8 C) 98.5 F (36.9 C) 97.6  F (36.4 C)   TempSrc: Oral Oral Oral   SpO2: 90% 94% 94% 95%  Weight:      Height:         Intake/Output Summary (Last 24 hours) at 06/22/2019 0902 Last data filed at 06/22/2019 0600 Gross per 24 hour  Intake 434.05 ml  Output 1200 ml  Net -765.95 ml    Last 3 Weights 06/21/2019 06/02/2019 06/01/2019  Weight (lbs) 172 lb 3.2 oz 189 lb 2.5 oz 186 lb 4.6 oz  Weight (kg) 78.109 kg 85.8 kg 84.5 kg    Body mass index is 26.18 kg/m.  General: Well nourished, well developed, in no acute distress Head: Atraumatic, normal size  Eyes: PEERLA, EOMI  Neck: Supple, no JVD Endocrine: No thryomegaly Cardiac: Normal S1, S2; RRR; no murmurs, rubs, or gallops Lungs: Diffuse rales bilaterally Abd: Soft, nontender, no hepatomegaly  Ext: No edema,  pulses 2+ Musculoskeletal: No deformities, BUE and BLE strength normal and equal Skin: Warm and dry, no rashes   Neuro: Alert and oriented to person, place, time, and situation, CNII-XII grossly intact, no focal deficits  Psych: Normal mood and affect   Labs  High Sensitivity Troponin:   Recent Labs  Lab 05/31/19 0329 05/31/19 0534 05/31/19 1039  TROPONINIHS 18* 17 14     Cardiac EnzymesNo results for input(s): TROPONINI in the last 168 hours. No results for input(s): TROPIPOC in the last 168 hours.  Chemistry Recent Labs  Lab 06/18/19 1645 06/19/19 0341 06/20/19 0001 06/21/19 0715  NA 131* 135 135 133*  K 4.0 4.1 4.0 4.1  CL 98 102 98 95*  CO2 _0 GLUCOSE 304* 107* 95 170*  BUN 26* 21 19 24*  CREATININE 1.42* 1.23 1.28* 1.42*  CALCIUM 7.5* 8.1* 8.1* 8.1*  PROT 5.2* 5.2* 5.6*  --   ALBUMIN 2.3* 2.3* 2.4*  --   AST _1 --   ALT _2 --   ALKPHOS 30* 28* 32*  --   BILITOT 1.3* 0.8 1.1  --   GFRNONAA 48* 57* 54* 48*  GFRAA 56* >60 >60 56*  ANIONGAP _3 Hematology Recent Labs  Lab 06/20/19 0001 06/21/19 0715 06/22/19 0753  WBC 7.3 5.9 5.8  RBC 4.14* 3.95* 3.86*  HGB 13.0 12.5* 12.3*  HCT 39.9 37.5* 36.0*  MCV 96.4 94.9 93.3  MCH 31.4 31.6 31.9  MCHC 32.6 33.3 34.2  RDW 15.0 14.9 14.8  PLT 99* 101* 91*   BNP Recent Labs  Lab 06/19/19 0341  BNP 133.1*    DDimer No results for input(s): DDIMER in the last 168 hours.   Radiology  No results found.  Cardiac Studies  TTE 05/31/2019  1. Left ventricular ejection fraction, by visual estimation, is 60 to 65%. The left ventricle has normal function. Left ventricular septal wall thickness was mildly increased. Mildly increased left ventricular posterior wall thickness. There is mildly  increased left ventricular hypertrophy.  2. Global right ventricle has normal systolic function.The right ventricular size is mildly enlarged. No increase in right ventricular wall thickness.  3.  Left atrial size was normal.  4. Right atrial size was mildly dilated.  5. The mitral valve was not well visualized. Trace mitral valve regurgitation.  6. The tricuspid valve is not well visualized. Tricuspid valve regurgitation is mild.  7. The aortic valve is grossly normal. Aortic valve regurgitation is trivial.  8. The pulmonic valve was not well visualized.  Pulmonic valve regurgitation is trivial.  9. The aortic root was not well visualized. 10. Mildly elevated pulmonary artery systolic pressure. 11. The atrial septum is grossly normal.   Patient Profile  Jay Morris is a 73 y.o. male with COPD, paroxysmal atrial fibrillation status post cardiac ablation at [facility] 20 years ago, on anticoagulation with no interrupted dosing, diabetes, hypertension who was initially admitted to [facility] on 11/14 for Covid pneumonia.  His course there was complicated by A. fib with RVR as well as atrial flutter.  He was transferred here on 12/3 for management of his A. fib.  Assessment & Plan   1.  Atrial fibrillation/atrial flutter: He developed this in the setting of Covid pneumonia on 11/14.  He has been at [facility] and has recovered from his Covid pneumonia.  He is on 2 L oxygen.  He was initially on amiodarone IV diltiazem as well as oral metoprolol at [facility].  His course was complicated by bradycardia with this.  He has been maintained on IV amiodarone and metoprolol for the past few days.  He appears to be an atypical atrial flutter on my review of his EKG and telemetry.  He is rate controlled in the 60s.  Given that he has had rather difficult to control rates and per review of chart Esmond Plants is tend to be more of an atypical atrial flutter, we will proceed with cardioversion today.  He has missed no doses of Pradaxa in the past 3 weeks on my review of his medications.  He is cleared from infection control standpoint to undergo procedures per infectious disease and oncology [facility].   He has received a few days of IV amiodarone.  After his cardioversion we will plan to transition to 400 mg twice daily for 7 days and then 200 mg daily for 21 days.  We will plan to complete a full month of therapy and then stop.  Amiodarone will not be a long-term therapy for him. We will also continue his home beta-blocker.  He will continue home Pradaxa at discharge and after cardioversion.  For questions or updates, please contact [place] Please consult [email or web address] for contact info under   Signed, Lake Bells T. Audie Box, [place]  06/22/2019 9:02 AM

## 2019-06-22 NOTE — CV Procedure (Signed)
CARDIOVERSION  Patient sedated by anesthesia with Propofol and lidocaine intravenously  With pads in AP position, patient cardioverted to SR with 200 J synchronized biphasic energy  Procedure was without complication  12 lead EKG pending  Dorris Carnes MD

## 2019-06-22 NOTE — Progress Notes (Signed)
Patient ID: Jay Morris, male   DOB: Dec 02, 1945, 73 y.o.   MRN: [id number]  PROGRESS NOTE    Jay Morris  XBM:[id number] DOB: 20-Jan-1946 DOA: 06/02/2019 PCP: [name], MD   Brief Narrative:  74 year old male with history of COPD, bronchial asthma, paroxysmal A. fib on Pradaxa, diabetes mellitus type 2, hypertension presented with fevers, chills and worsening shortness of breath on 06/02/2019 and was found to have acute hypoxic respiratory failure secondary to Covid pneumonia.  Hospital course was complicated by progressively worsening hypoxemia requiring 15 L of high flow oxygen along with A. fib with RVR.  He was treated with steroids, remdesivir, convalescent plasma with slow clinical improvement.  Further hospital course has been lately complicated by acute urinary retention requiring Foley catheter placement, constipation/fecal traction requiring laxative/enema, symptomatic orthostatic hypotension and A. fib/flutter with RVR requiring amiodarone/Cardizem infusions.  Due to ongoing difficulties with rate control, patient was transferred to [facility] from [facility] on 06/21/2019 as per cardiology recommendations.  Patient's first COVID-19 test was on 05/30/2019 at [facility] (results available in [place]).  Patient has completed treatment for Covid pneumonia.  Prior hospitalist spoke with Dr. Luvenia Heller and his isolation has been discontinued as he is more than 3 weeks from his first test.  Assessment & Plan:   Acute hypoxic respite failure due to COVID-19 viral pneumonia -Patient had required up to 15 L of high flow oxygen -Much improved.  Currently on 2 L oxygen via nasal cannula. -Wean off as able.  Incentive spirometry. -Patient has already completed course of steroids and remdesivir.  He also received convalescent plasma during this hospitalization on 06/03/2019.  Paroxysmal a flutter/A. fib with RVR -Difficult rate control.  Has required amiodarone/Cardizem infusion with  issues with bradycardia.  Subsequently Cardizem drip discontinued.  Currently on amiodarone drip and oral metoprolol.  Cardiology following.  Probable cardioversion today.  Continue Pradaxa.  Acute on chronic diastolic heart failure -Likely secondary to RVR.  Continue IV Lasix.  Strict input and output.  Daily weights.  Follow cardiology recommendations.  Will need to use caution with diuresis with history of chronic orthostatic hypotension  Constipation Abdominal pain probably from above -CT of the abdomen on 06/17/2019 did not show any acute abnormalities.  Abdominal pain has resolved with laxatives.  Continue current bowel regimen  Acute urinary retention BPH -Status post Foley catheter placement on 06/15/2019.  Flomax was increased to 0.8 mg and finasteride was added.  Voiding trial on 06/17/2019 was unsuccessful.  Foley catheter was reinserted on 06/19/2019.  Will need outpatient urology evaluation and follow-up for voiding trial.  Because of orthostatic hypotension, Flomax was decreased back to 0.4 mg daily.  Chronic orthostatic hypotension  -Continue low-dose midodrine.  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  Creatinine back to baseline.  Monitor intermittently  Thrombocytopenia -Questionable cause.  No signs of bleeding.  Monitor  Diabetes mellitus type 2 uncontrolled with hyperglycemia -Continue CBGs with SSI along with Lantus  Hypertension -Continue metoprolol/Lasix  COPD-stable.  Continue bronchodilators  Deconditioning/debility -PT/OT recommend SNF.  Family prefers home with home health services.   DVT prophylaxis: Pradaxa Code Status: Full Family Communication: Spoke to patient at bedside Disposition Plan: Home with home health in 1 to 2 days once cleared by cardiology  Consultants: Cardiology  Procedures: None  Antimicrobials: None   Subjective: Patient seen and examined at bedside.  He denies any overnight fevers, nausea,  vomiting.  Denies worsening abdominal pain.  No worsening shortness  of breath.  Objective: Vitals:   06/22/19 0424 06/22/19 0757 06/22/19 0835 06/22/19 0953  BP: (!) 86/48 (!) 99/50  135/63  Pulse: 67 66 66 66  Resp: 20 15 (!) 29 20  Temp: 98.5 F (36.9 C) 97.6 F (36.4 C)  (!) 97.3 F (36.3 C)  TempSrc: Oral Oral  Temporal  SpO2: 94% 94% 95% 97%  Weight:      Height:        Intake/Output Summary (Last 24 hours) at 06/22/2019 1100 Last data filed at 06/22/2019 0600 Gross per 24 hour  Intake 434.05 ml  Output 1000 ml  Net -565.95 ml   Filed Weights   06/02/19 0445 06/21/19 2033  Weight: 85.8 kg 78.1 kg    Examination:  General exam: Appears calm and comfortable  Respiratory system: Bilateral decreased breath sounds at bases with scattered crackles.  Intermittent tachypnea Cardiovascular system: S1 & S2 heard, Rate controlled Gastrointestinal system: Abdomen is nondistended, soft and nontender. Normal bowel sounds heard. Extremities: No cyanosis, clubbing; trace edema   Data Reviewed: I have personally reviewed following labs and imaging studies  CBC: Recent Labs  Lab 06/19/19 0341 06/20/19 0001 06/21/19 0715 06/22/19 0753  WBC 6.7 7.3 5.9 5.8  HGB 12.7* 13.0 12.5* 12.3*  HCT 38.5* 39.9 37.5* 36.0*  MCV 94.8 96.4 94.9 93.3  PLT 109* 99* 101* 91*   Basic Metabolic Panel: Recent Labs  Lab 06/18/19 1645 06/19/19 0341 06/20/19 0001 06/21/19 0715 06/22/19 0753  NA 131* 135 135 133* 132*  K 4.0 4.1 4.0 4.1 4.0  CL 98 102 98 95* 96*  CO2 _0 GLUCOSE 304* 107* 95 170* 127*  BUN 26* 21 19 24* 21  CREATININE 1.42* 1.23 1.28* 1.42* 1.41*  CALCIUM 7.5* 8.1* 8.1* 8.1* 8.1*   GFR: Estimated Creatinine Clearance: 43.8 mL/min (A) (by C-G formula based on SCr of 1.41 mg/dL (H)). Liver Function Tests: Recent Labs  Lab 06/18/19 1645 06/19/19 0341 06/20/19 0001 06/22/19 0753  AST _1 ALT _2 ALKPHOS 30* 28* 32* 32*  BILITOT 1.3*  0.8 1.1 1.1  PROT 5.2* 5.2* 5.6* 5.5*  ALBUMIN 2.3* 2.3* 2.4* 2.2*   No results for input(s): LIPASE, AMYLASE in the last 168 hours. No results for input(s): AMMONIA in the last 168 hours. Coagulation Profile: No results for input(s): INR, PROTIME in the last 168 hours. Cardiac Enzymes: No results for input(s): CKTOTAL, CKMB, CKMBINDEX, TROPONINI in the last 168 hours. BNP (last 3 results) No results for input(s): PROBNP in the last 8760 hours. HbA1C: No results for input(s): HGBA1C in the last 72 hours. CBG: Recent Labs  Lab 06/21/19 0815 06/21/19 1231 06/21/19 1738 06/21/19 2143 06/22/19 0602  GLUCAP 172* 175* 202* 133* 114*   Lipid Profile: No results for input(s): CHOL, HDL, LDLCALC, TRIG, CHOLHDL, LDLDIRECT in the last 72 hours. Thyroid Function Tests: No results for input(s): TSH, T4TOTAL, FREET4, T3FREE, THYROIDAB in the last 72 hours. Anemia Panel: No results for input(s): VITAMINB12, FOLATE, FERRITIN, TIBC, IRON, RETICCTPCT in the last 72 hours. Sepsis Labs: No results for input(s): PROCALCITON, LATICACIDVEN in the last 168 hours.  No results found for this or any previous visit (from the past 240 hour(s)).       Radiology Studies: No results found.      Scheduled Meds: . [MAR Hold] albuterol  2 puff Inhalation TID  . [MAR Hold] allopurinol  100 mg Oral Daily  . [facility] Hold]  atorvastatin  20 mg Oral q1800  . [MAR Hold] Chlorhexidine Gluconate Cloth  6 each Topical Daily  . [MAR Hold] dabigatran  150 mg Oral BID  . [MAR Hold] finasteride  5 mg Oral Daily  . [MAR Hold] fluticasone furoate-vilanterol  1 puff Inhalation Daily  . [MAR Hold] furosemide  40 mg Intravenous Daily  . [MAR Hold] insulin aspart  0-15 Units Subcutaneous TID WC  . [MAR Hold] insulin aspart  2-5 Units Subcutaneous QHS  . [MAR Hold] insulin aspart  4 Units Subcutaneous TID WC  . [MAR Hold] insulin glargine  20 Units Subcutaneous QHS  . [MAR Hold] metoprolol tartrate  50 mg Oral Q6H   . [MAR Hold] midodrine  2.5 mg Oral BID WC  . [MAR Hold] pantoprazole  40 mg Oral BID AC  . [MAR Hold] polyethylene glycol  17 g Oral TID  . [MAR Hold] senna  2 tablet Oral QHS  . [MAR Hold] sucralfate  1 g Oral TID WC & HS  . [MAR Hold] tamsulosin  0.4 mg Oral QHS  . [MAR Hold] umeclidinium bromide  1 puff Inhalation Daily   Continuous Infusions: . amiodarone 30 mg/hr (06/22/19 0247)          Aline August, MD [facility] 06/22/2019, 11:00 AM

## 2019-06-22 NOTE — Plan of Care (Signed)
  Problem: Education: Goal: Knowledge of General Education information will improve Description: Including pain rating scale, medication(s)/side effects and non-pharmacologic comfort measures Outcome: Progressing   Problem: Health Behavior/Discharge Planning: Goal: Ability to manage health-related needs will improve Outcome: Progressing

## 2019-06-22 NOTE — Transfer of Care (Signed)
Immediate Anesthesia Transfer of Care Note  Patient: Jay Morris  Procedure(s) Performed: CARDIOVERSION (N/A )  Patient Location: PACU  Anesthesia Type:General  Level of Consciousness: drowsy and patient cooperative  Airway & Oxygen Therapy: Patient Spontanous Breathing and Patient connected to nasal cannula oxygen  Post-op Assessment: Report given to RN, Post -op Vital signs reviewed and stable and Patient moving all extremities  Post vital signs: Reviewed and stable  Last Vitals:  Vitals Value Taken Time  BP    Temp    Pulse 57 06/22/19 1119  Resp 9 06/22/19 1119  SpO2 94 % 06/22/19 1119  Vitals shown include unvalidated device data.  Last Pain:  Vitals:   06/22/19 0953  TempSrc: Temporal  PainSc: 2       Patients Stated Pain Goal: 3 (68/86/48 4720)  Complications: No apparent anesthesia complications

## 2019-06-22 NOTE — Interval H&P Note (Signed)
History and Physical Interval Note:  06/22/2019 9:40 AM  Jay Morris  has presented today for surgery, with the diagnosis of afib.  The various methods of treatment have been discussed with the patient and family. After consideration of risks, benefits and other options for treatment, the patient has consented to  Procedure(s): CARDIOVERSION (N/A) as a surgical intervention.  The patient's history has been reviewed, patient examined, no change in status, stable for surgery.  I have reviewed the patient's chart and labs.  Questions were answered to the patient's satisfaction.     Dorris Carnes

## 2019-06-23 ENCOUNTER — Inpatient Hospital Stay (HOSPITAL_COMMUNITY): Payer: Medicare HMO

## 2019-06-23 DIAGNOSIS — I5033 Acute on chronic diastolic (congestive) heart failure: Secondary | ICD-10-CM

## 2019-06-23 LAB — GLUCOSE, CAPILLARY
Glucose-Capillary: 117 mg/dL — ABNORMAL HIGH (ref 70–99)
Glucose-Capillary: 223 mg/dL — ABNORMAL HIGH (ref 70–99)
Glucose-Capillary: 173 mg/dL — ABNORMAL HIGH (ref 70–99)
Glucose-Capillary: 179 mg/dL — ABNORMAL HIGH (ref 70–99)

## 2019-06-23 LAB — COMPREHENSIVE METABOLIC PANEL
ALT: 26 U/L (ref 0–44)
AST: 27 U/L (ref 15–41)
Albumin: 2.1 g/dL — ABNORMAL LOW (ref 3.5–5.0)
Alkaline Phosphatase: 31 U/L — ABNORMAL LOW (ref 38–126)
Anion gap: 11 (ref 5–15)
BUN: 24 mg/dL — ABNORMAL HIGH (ref 8–23)
CO2: 24 mmol/L (ref 22–32)
Calcium: 7.9 mg/dL — ABNORMAL LOW (ref 8.9–10.3)
Chloride: 98 mmol/L (ref 98–111)
Creatinine, Ser: 1.41 mg/dL — ABNORMAL HIGH (ref 0.61–1.24)
GFR calc Af Amer: 56 mL/min — ABNORMAL LOW (ref >60)
GFR calc non Af Amer: 48 mL/min — ABNORMAL LOW (ref >60)
Glucose, Bld: 131 mg/dL — ABNORMAL HIGH (ref 70–99)
Potassium: 4 mmol/L (ref 3.5–5.1)
Sodium: 133 mmol/L — ABNORMAL LOW (ref 135–145)
Total Bilirubin: 1.4 mg/dL — ABNORMAL HIGH (ref 0.3–1.2)
Total Protein: 5.3 g/dL — ABNORMAL LOW (ref 6.5–8.1)

## 2019-06-23 LAB — CBC WITH DIFFERENTIAL/PLATELET
Abs Immature Granulocytes: 0.01 10*3/uL (ref 0.00–0.07)
Basophils Absolute: 0 10*3/uL (ref 0.0–0.1)
Basophils Relative: 0 %
Eosinophils Absolute: 0 10*3/uL (ref 0.0–0.5)
Eosinophils Relative: 0 %
HCT: 36.6 % — ABNORMAL LOW (ref 39.0–52.0)
Hemoglobin: 11.9 g/dL — ABNORMAL LOW (ref 13.0–17.0)
Immature Granulocytes: 0 %
Lymphocytes Relative: 18 %
Lymphs Abs: 0.9 10*3/uL (ref 0.7–4.0)
MCH: 31 pg (ref 26.0–34.0)
MCHC: 32.5 g/dL (ref 30.0–36.0)
MCV: 95.3 fL (ref 80.0–100.0)
Monocytes Absolute: 0.3 10*3/uL (ref 0.1–1.0)
Monocytes Relative: 6 %
Neutro Abs: 3.9 10*3/uL (ref 1.7–7.7)
Neutrophils Relative %: 76 %
Platelets: 85 10*3/uL — ABNORMAL LOW (ref 150–400)
RBC: 3.84 MIL/uL — ABNORMAL LOW (ref 4.22–5.81)
RDW: 15 % (ref 11.5–15.5)
WBC: 5.1 10*3/uL (ref 4.0–10.5)
nRBC: 0 % (ref 0.0–0.2)

## 2019-06-23 LAB — MAGNESIUM: Magnesium: 1.8 mg/dL (ref 1.7–2.4)

## 2019-06-23 MED ORDER — METOPROLOL TARTRATE 25 MG PO TABS
25.0000 mg | ORAL_TABLET | Freq: Two times a day (BID) | ORAL | Status: DC
  Administered 2019-06-23 – 2019-06-25 (×4): 25 mg via ORAL
  Filled 2019-06-23 (×4): qty 1

## 2019-06-23 MED ORDER — APIXABAN 5 MG PO TABS
5.0000 mg | ORAL_TABLET | Freq: Two times a day (BID) | ORAL | Status: DC
  Administered 2019-06-23 – 2019-06-25 (×4): 5 mg via ORAL
  Filled 2019-06-23 (×4): qty 1

## 2019-06-23 MED ORDER — AMIODARONE HCL 200 MG PO TABS
200.0000 mg | ORAL_TABLET | Freq: Two times a day (BID) | ORAL | Status: DC
  Administered 2019-06-23 – 2019-06-25 (×5): 200 mg via ORAL
  Filled 2019-06-23 (×5): qty 1

## 2019-06-23 NOTE — Discharge Instructions (Addendum)
Person Under Monitoring Name: Jay Morris  Location: [address] [place] [address]   Infection Prevention Recommendations for Individuals Confirmed to have, or Being Evaluated for, 2019 Novel Coronavirus (COVID-19) Infection Who Receive Care at Home  Individuals who are confirmed to have, or are being evaluated for, COVID-19 should follow the prevention steps below until a healthcare provider or local or state health department says they can return to normal activities.  Stay home except to get medical care You should restrict activities outside your home, except for getting medical care. Do not go to work, school, or public areas, and do not use public transportation or taxis.  Call ahead before visiting your doctor Before your medical appointment, call the healthcare provider and tell them that you have, or are being evaluated for, COVID-19 infection. This will help the healthcare providers office take steps to keep other people from getting infected. Ask your healthcare provider to call the local or state health department.  Monitor your symptoms Seek prompt medical attention if your illness is worsening (e.g., difficulty breathing). Before going to your medical appointment, call the healthcare provider and tell them that you have, or are being evaluated for, COVID-19 infection. Ask your healthcare provider to call the local or state health department.  Wear a facemask You should wear a facemask that covers your nose and mouth when you are in the same room with other people and when you visit a healthcare provider. People who live with or visit you should also wear a facemask while they are in the same room with you.  Separate yourself from other people in your home As much as possible, you should stay in a different room from other people in your home. Also, you should use a separate bathroom, if available.  Avoid sharing household items You should not share  dishes, drinking glasses, cups, eating utensils, towels, bedding, or other items with other people in your home. After using these items, you should wash them thoroughly with soap and water.  Cover your coughs and sneezes Cover your mouth and nose with a tissue when you cough or sneeze, or you can cough or sneeze into your sleeve. Throw used tissues in a lined trash can, and immediately wash your hands with soap and water for at least 20 seconds or use an alcohol-based hand rub.  Wash your [facility] your hands often and thoroughly with soap and water for at least 20 seconds. You can use an alcohol-based hand sanitizer if soap and water are not available and if your hands are not visibly dirty. Avoid touching your eyes, nose, and mouth with unwashed hands.   Prevention Steps for Caregivers and Household Members of Individuals Confirmed to have, or Being Evaluated for, COVID-19 Infection Being Cared for in the Home  If you live with, or provide care at home for, a person confirmed to have, or being evaluated for, COVID-19 infection please follow these guidelines to prevent infection:  Follow healthcare providers instructions Make sure that you understand and can help the patient follow any healthcare provider instructions for all care.  Provide for the patients basic needs You should help the patient with basic needs in the home and provide support for getting groceries, prescriptions, and other personal needs.  Monitor the patients symptoms If they are getting sicker, call his or her medical provider and tell them that the patient has, or is being evaluated for, COVID-19 infection. This will help the healthcare providers office take  steps to keep other people from getting infected. Ask the healthcare provider to call the local or state health department.  Limit the number of people who have contact with the patient  If possible, have only one caregiver for the patient.  Other  household members should stay in another home or place of residence. If this is not possible, they should stay  in another room, or be separated from the patient as much as possible. Use a separate bathroom, if available.  Restrict visitors who do not have an essential need to be in the home.  Keep older adults, very young children, and other sick people away from the patient Keep older adults, very young children, and those who have compromised immune systems or chronic health conditions away from the patient. This includes people with chronic heart, lung, or kidney conditions, diabetes, and cancer.  Ensure good ventilation Make sure that shared spaces in the home have good air flow, such as from an air conditioner or an opened window, weather permitting.  Wash your hands often  Wash your hands often and thoroughly with soap and water for at least 20 seconds. You can use an alcohol based hand sanitizer if soap and water are not available and if your hands are not visibly dirty.  Avoid touching your eyes, nose, and mouth with unwashed hands.  Use disposable paper towels to dry your hands. If not available, use dedicated cloth towels and replace them when they become wet.  Wear a facemask and gloves  Wear a disposable facemask at all times in the room and gloves when you touch or have contact with the patients blood, body fluids, and/or secretions or excretions, such as sweat, saliva, sputum, nasal mucus, vomit, urine, or feces.  Ensure the mask fits over your nose and mouth tightly, and do not touch it during use.  Throw out disposable facemasks and gloves after using them. Do not reuse.  Wash your hands immediately after removing your facemask and gloves.  If your personal clothing becomes contaminated, carefully remove clothing and launder. Wash your hands after handling contaminated clothing.  Place all used disposable facemasks, gloves, and other waste in a lined container before  disposing them with other household waste.  Remove gloves and wash your hands immediately after handling these items.  Do not share dishes, glasses, or other household items with the patient  Avoid sharing household items. You should not share dishes, drinking glasses, cups, eating utensils, towels, bedding, or other items with a patient who is confirmed to have, or being evaluated for, COVID-19 infection.  After the person uses these items, you should wash them thoroughly with soap and water.  Wash laundry thoroughly  Immediately remove and wash clothes or bedding that have blood, body fluids, and/or secretions or excretions, such as sweat, saliva, sputum, nasal mucus, vomit, urine, or feces, on them.  Wear gloves when handling laundry from the patient.  Read and follow directions on labels of laundry or clothing items and detergent. In general, wash and dry with the warmest temperatures recommended on the label.  Clean all areas the individual has used often  Clean all touchable surfaces, such as counters, tabletops, doorknobs, bathroom fixtures, toilets, phones, keyboards, tablets, and bedside tables, every day. Also, clean any surfaces that may have blood, body fluids, and/or secretions or excretions on them.  Wear gloves when cleaning surfaces the patient has come in contact with.  Use a diluted bleach solution (e.g., dilute bleach with 1 part bleach  and 10 parts water) or a household disinfectant with a label that says EPA-registered for coronaviruses. To make a bleach solution at home, add 1 tablespoon of bleach to 1 quart (4 cups) of water. For a larger supply, add  cup of bleach to 1 gallon (16 cups) of water.  Read labels of cleaning products and follow recommendations provided on product labels. Labels contain instructions for safe and effective use of the cleaning product including precautions you should take when applying the product, such as wearing gloves or eye protection  and making sure you have good ventilation during use of the product.  Remove gloves and wash hands immediately after cleaning.  Monitor yourself for signs and symptoms of illness Caregivers and household members are considered close contacts, should monitor their health, and will be asked to limit movement outside of the home to the extent possible. Follow the monitoring steps for close contacts listed on the symptom monitoring form.   ? If you have additional questions, contact your local health department or call the epidemiologist on call at [phone] (available 24/7). ? This guidance is subject to change. For the most up-to-date guidance from [facility], please refer to their website: [email or web address].pl  ______________________________________________________________________________________________  Information on my medicine - ELIQUIS (apixaban)  This medication education was reviewed with me or my healthcare representative as part of my discharge preparation.  The pharmacist that spoke with me during my hospital stay was:  Pat Patrick, [facility]  Why was Eliquis prescribed for you? Eliquis was prescribed for you to reduce the risk of a blood clot forming that can cause a stroke if you have a medical condition called atrial fibrillation (a type of irregular heartbeat).  What do You need to know about Eliquis ? Take your Eliquis TWICE DAILY - one tablet in the morning and one tablet in the evening with or without food. If you have difficulty swallowing the tablet whole please discuss with your pharmacist how to take the medication safely.  Take Eliquis exactly as prescribed by your doctor and DO NOT stop taking Eliquis without talking to the doctor who prescribed the medication.  Stopping may increase your risk of developing a stroke.  Refill your prescription before you run out.  After discharge, you should have regular  check-up appointments with your healthcare provider that is prescribing your Eliquis.  In the future your dose may need to be changed if your kidney function or weight changes by a significant amount or as you get older.  What do you do if you miss a dose? If you miss a dose, take it as soon as you remember on the same day and resume taking twice daily.  Do not take more than one dose of ELIQUIS at the same time to make up a missed dose.  Important Safety Information A possible side effect of Eliquis is bleeding. You should call your healthcare provider right away if you experience any of the following: ? Bleeding from an injury or your nose that does not stop. ? Unusual colored urine (red or dark brown) or unusual colored stools (red or black). ? Unusual bruising for unknown reasons. ? A serious fall or if you hit your head (even if there is no bleeding).  Some medicines may interact with Eliquis and might increase your risk of bleeding or clotting while on Eliquis. To help avoid this, consult your healthcare provider or pharmacist prior to using any new prescription or non-prescription medications, including herbals, vitamins, non-steroidal anti-inflammatory drugs (  NSAIDs) and supplements.  This website has more information on Eliquis (apixaban): http://www.eliquis.com/eliquis/home

## 2019-06-23 NOTE — Progress Notes (Addendum)
Progress Note  Patient Name: Jay Morris Date of Encounter: 06/23/2019  Primary Cardiologist:   No primary care provider on file.   Subjective   Feels OK.  No acute SOB.  Ambulated but "over did it" yesterday.   Inpatient Medications    Scheduled Meds: . albuterol  2 puff Inhalation TID  . allopurinol  100 mg Oral Daily  . atorvastatin  20 mg Oral q1800  . Chlorhexidine Gluconate Cloth  6 each Topical Daily  . dabigatran  150 mg Oral BID  . finasteride  5 mg Oral Daily  . fluticasone furoate-vilanterol  1 puff Inhalation Daily  . furosemide  40 mg Intravenous Daily  . insulin aspart  0-15 Units Subcutaneous TID WC  . insulin aspart  2-5 Units Subcutaneous QHS  . insulin aspart  4 Units Subcutaneous TID WC  . insulin glargine  20 Units Subcutaneous QHS  . metoprolol tartrate  50 mg Oral Q6H  . midodrine  2.5 mg Oral BID WC  . pantoprazole  40 mg Oral BID AC  . polyethylene glycol  17 g Oral TID  . senna  2 tablet Oral QHS  . sucralfate  1 g Oral TID WC & HS  . tamsulosin  0.4 mg Oral QHS  . umeclidinium bromide  1 puff Inhalation Daily   Continuous Infusions: . amiodarone 30 mg/hr (06/23/19 0346)   PRN Meds: acetaminophen, alum & mag hydroxide-simeth, bisacodyl, chlorpheniramine-HYDROcodone, cyclobenzaprine, diphenhydrAMINE, guaiFENesin-dextromethorphan, lactulose, metoprolol tartrate, ondansetron **OR** ondansetron (ZOFRAN) IV, promethazine, sodium chloride   Vital Signs    Vitals:   06/23/19 0351 06/23/19 0622 06/23/19 0728 06/23/19 0800  BP: (!) 114/55   (!) 111/49  Pulse:  (!) 58 68 61  Resp:   (!) 22 19  Temp: 97.6 F (36.4 C)   (!) 97.5 F (36.4 C)  TempSrc: Oral   Oral  SpO2: 90%  91% 93%  Weight:      Height:        Intake/Output Summary (Last 24 hours) at 06/23/2019 1054 Last data filed at 06/23/2019 0600 Gross per 24 hour  Intake 300.85 ml  Output 350 ml  Net -49.15 ml   Filed Weights   06/02/19 0445 06/21/19 2033  Weight: 85.8 kg 78.1  kg    Telemetry    NSR - Personally Reviewed  ECG    NA - Personally Reviewed  Physical Exam   GEN: No acute distress.   Neck: No  JVD Cardiac: RRR, no murmurs, rubs, or gallops.  Respiratory:   Diffuse coarse crackles GI: Soft, nontender, non-distended  MS: No  edema; No deformity. Neuro:  Nonfocal  Psych: Normal affect   Labs    Chemistry Recent Labs  Lab 06/20/19 0001 06/21/19 0715 06/22/19 0753 06/23/19 0812  NA 135 133* 132* 133*  K 4.0 4.1 4.0 4.0  CL 98 95* 96* 98  CO2 _0 GLUCOSE 95 170* 127* 131*  BUN 19 24* 21 24*  CREATININE 1.28* 1.42* 1.41* 1.41*  CALCIUM 8.1* 8.1* 8.1* 7.9*  PROT 5.6*  --  5.5* 5.3*  ALBUMIN 2.4*  --  2.2* 2.1*  AST 22  --  24 27  ALT 28  --  24 26  ALKPHOS 32*  --  32* 31*  BILITOT 1.1  --  1.1 1.4*  GFRNONAA 54* 48* 48* 48*  GFRAA >60 56* 56* 56*  ANIONGAP _1 Hematology Recent Labs  Lab 06/21/19 0715 06/22/19  6854 06/23/19 0812  WBC 5.9 5.8 5.1  RBC 3.95* 3.86* 3.84*  HGB 12.5* 12.3* 11.9*  HCT 37.5* 36.0* 36.6*  MCV 94.9 93.3 95.3  MCH 31.6 31.9 31.0  MCHC 33.3 34.2 32.5  RDW 14.9 14.8 15.0  PLT 101* 91* 85*    Cardiac EnzymesNo results for input(s): TROPONINI in the last 168 hours. No results for input(s): TROPIPOC in the last 168 hours.   BNP Recent Labs  Lab 06/19/19 0341  BNP 133.1*     DDimer No results for input(s): DDIMER in the last 168 hours.   Radiology    Dg Chest Port 1 View  Result Date: 06/23/2019 CLINICAL DATA:  Reason for exam: dyspnea Patient preferred not to take necklace off (wedding ring attached). EXAM: PORTABLE CHEST 1 VIEW COMPARISON:  Radiograph 06/19/2019, 05/31/2019 FINDINGS: Stable cardiac silhouette. There is bilateral peripheral airspace disease unchanged from comparison exam. Mild basilar atelectasis. No pleural fluid. No pneumothorax. No acute osseous abnormality. IMPRESSION: Persistent peripheral severe airspace disease. No significant interval  change. Findings suggestive of viral pneumonia. Electronically Signed   By: Suzy Bouchard M.D.   On: 06/23/2019 09:09    Cardiac Studies   ECHO    1. Left ventricular ejection fraction, by visual estimation, is 60 to 65%. The left ventricle has normal function. Left ventricular septal wall thickness was mildly increased. Mildly increased left ventricular posterior wall thickness. There is mildly  increased left ventricular hypertrophy.  2. Global right ventricle has normal systolic function.The right ventricular size is mildly enlarged. No increase in right ventricular wall thickness.  3. Left atrial size was normal.  4. Right atrial size was mildly dilated.  5. The mitral valve was not well visualized. Trace mitral valve regurgitation.  6. The tricuspid valve is not well visualized. Tricuspid valve regurgitation is mild.  7. The aortic valve is grossly normal. Aortic valve regurgitation is trivial.  8. The pulmonic valve was not well visualized. Pulmonic valve regurgitation is trivial.  9. The aortic root was not well visualized. 10. Mildly elevated pulmonary artery systolic pressure. 11. The atrial septum is grossly normal.  Patient Profile     73 y.o. male with COPD, paroxysmal atrial fibrillation status post cardiac ablation at [facility] 20 years ago, on anticoagulation with no interrupted dosing, diabetes, hypertension who was initially admitted to [facility] on 11/14 for Covid pneumonia.  His course there was complicated by A. fib with RVR as well as atrial flutter.  He was transferred here on 12/3 for management of his A. Fib.  He is now post cardioversion.    Assessment & Plan    ATRIAL FIB:  Status post DCCV.  Continue amiodarone 200 mg twice daily for 7 days and then 200 mg daily for 21 days.  We will plan to complete a full month of therapy and then stop.  Amiodarone will not be a long-term therapy for him. We will also continue his home beta-blocker.  Need to change from  Pradaxa to Eliquis secondary to drug interaction. Change to previous dose of beta blocker.   CHRONIC DIASTOLIC HF:  Seems to be euvolemic.  No change in therapy.    For questions or updates, please contact [facility] Please consult [email or web address] for contact info under Cardiology/STEMI.   Signed, Minus Breeding, MD  06/23/2019, 10:54 AM

## 2019-06-23 NOTE — Progress Notes (Signed)
Patient ID: Jay Morris, male   DOB: 03-03-1946, 73 y.o.   MRN: [id number]  PROGRESS NOTE    Jay Morris  [id number]:[id number] DOB: 1945-12-28 DOA: 06/02/2019 PCP: [name], MD   Brief Narrative:  73 year old male with history of COPD, bronchial asthma, paroxysmal A. fib on Pradaxa, diabetes mellitus type 2, hypertension presented with fevers, chills and worsening shortness of breath on 06/02/2019 and was found to have acute hypoxic respiratory failure secondary to Covid pneumonia.  Hospital course was complicated by progressively worsening hypoxemia requiring 15 L of high flow oxygen along with A. fib with RVR.  He was treated with steroids, remdesivir, convalescent plasma with slow clinical improvement.  Further hospital course has been lately complicated by acute urinary retention requiring Foley catheter placement, constipation/fecal traction requiring laxative/enema, symptomatic orthostatic hypotension and A. fib/flutter with RVR requiring amiodarone/Cardizem infusions.  Due to ongoing difficulties with rate control, patient was transferred to [facility] from [facility] on 06/21/2019 as per cardiology recommendations.  Patient's first COVID-19 test was on 05/30/2019 at [facility] (results available in [place]).  Patient has completed treatment for Covid pneumonia.  Prior hospitalist spoke with Dr. Luvenia Heller and his isolation has been discontinued as he is more than 3 weeks from his first test.  Assessment & Plan:   Acute hypoxic respiratory failure due to COVID-19 viral pneumonia -Patient had required up to 15 L of high flow oxygen -Much improved.  Patient was on 2 L oxygen via nasal cannula yesterday.  Currently on 4 L oxygen.  Will repeat chest x-ray today. -Wean off as able.  Incentive spirometry. -Patient has already completed course of steroids and remdesivir.  He also received convalescent plasma during this hospitalization on 06/03/2019.  Paroxysmal a flutter/A. fib with RVR  -Difficult rate control.  Has required amiodarone/Cardizem infusion with issues with bradycardia.  Subsequently Cardizem drip discontinued.  Currently on amiodarone drip and oral metoprolol.  Cardiology following.  Status post cardioversion on 06/22/2019.  Continue Pradaxa.  Acute on chronic diastolic heart failure -Likely secondary to RVR.  Continue IV Lasix.  Strict input and output.  Daily weights.  Follow cardiology recommendations.  Will need to use caution with diuresis with history of chronic orthostatic hypotension  Constipation Abdominal pain probably from above -CT of the abdomen on 06/17/2019 did not show any acute abnormalities.  Abdominal pain has resolved with laxatives.  Continue current bowel regimen  Acute urinary retention BPH -Status post Foley catheter placement on 06/15/2019.  Flomax was increased to 0.8 mg and finasteride was added.  Voiding trial on 06/17/2019 was unsuccessful.  Foley catheter was reinserted on 06/19/2019.  Will need outpatient urology evaluation and follow-up for voiding trial.  Because of orthostatic hypotension, Flomax was decreased back to 0.4 mg daily.  Chronic orthostatic hypotension  -Continue low-dose midodrine.  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  Creatinine back to baseline.  Monitor intermittently  Thrombocytopenia -Questionable cause.  No signs of bleeding.  Monitor  Diabetes mellitus type 2 uncontrolled with hyperglycemia -Continue CBGs with SSI along with Lantus  Hypertension -Continue metoprolol/Lasix  COPD-stable.  Continue bronchodilators  Deconditioning/debility -PT/OT recommend SNF.  Family prefers home with home health services.   DVT prophylaxis: Pradaxa Code Status: Full Family Communication: Spoke to patient at bedside Disposition Plan: Home with home health in 1 to 2 days once cleared by cardiology  Consultants: Cardiology  Procedures: None  Antimicrobials: None    Subjective: Patient seen and examined at bedside.  Patient feels slightly better.  Denies current chest pain or worsening shortness of breath.  Still feels weak and tired.  No overnight fever or vomiting.  Objective: Vitals:   06/23/19 0351 06/23/19 0622 06/23/19 0728 06/23/19 0800  BP: (!) 114/55   (!) 111/49  Pulse:  (!) 58 68 61  Resp:   (!) 22 19  Temp: 97.6 F (36.4 C)   (!) 97.5 F (36.4 C)  TempSrc: Oral   Oral  SpO2: 90%  91% 93%  Weight:      Height:        Intake/Output Summary (Last 24 hours) at 06/23/2019 1025 Last data filed at 06/23/2019 0600 Gross per 24 hour  Intake 300.85 ml  Output 350 ml  Net -49.15 ml   Filed Weights   06/02/19 0445 06/21/19 2033  Weight: 85.8 kg 78.1 kg    Examination:  General exam: Appears calm and comfortable.  Elderly male lying in bed.  Looks chronically ill.  No distress. Respiratory system: Bilateral decreased breath sounds at bases with some basilar crackles.   Cardiovascular system: Rate controlled, S1-S2 heard Gastrointestinal system: Abdomen is nondistended, soft and nontender. Normal bowel sounds heard. Extremities: No cyanosis; trace edema   Data Reviewed: I have personally reviewed following labs and imaging studies  CBC: Recent Labs  Lab 06/19/19 0341 06/20/19 0001 06/21/19 0715 06/22/19 0753 06/23/19 0812  WBC 6.7 7.3 5.9 5.8 5.1  NEUTROABS  --   --   --   --  3.9  HGB 12.7* 13.0 12.5* 12.3* 11.9*  HCT 38.5* 39.9 37.5* 36.0* 36.6*  MCV 94.8 96.4 94.9 93.3 95.3  PLT 109* 99* 101* 91* 85*   Basic Metabolic Panel: Recent Labs  Lab 06/19/19 0341 06/20/19 0001 06/21/19 0715 06/22/19 0753 06/23/19 0812  NA 135 135 133* 132* 133*  K 4.1 4.0 4.1 4.0 4.0  CL 102 98 95* 96* 98  CO2 _0 GLUCOSE 107* 95 170* 127* 131*  BUN 21 19 24* 21 24*  CREATININE 1.23 1.28* 1.42* 1.41* 1.41*  CALCIUM 8.1* 8.1* 8.1* 8.1* 7.9*  MG  --   --   --   --  1.8   GFR: Estimated Creatinine Clearance: 43.8 mL/min  (A) (by C-G formula based on SCr of 1.41 mg/dL (H)). Liver Function Tests: Recent Labs  Lab 06/18/19 1645 06/19/19 0341 06/20/19 0001 06/22/19 0753 06/23/19 0812  AST _1 ALT _2 ALKPHOS 30* 28* 32* 32* 31*  BILITOT 1.3* 0.8 1.1 1.1 1.4*  PROT 5.2* 5.2* 5.6* 5.5* 5.3*  ALBUMIN 2.3* 2.3* 2.4* 2.2* 2.1*   No results for input(s): LIPASE, AMYLASE in the last 168 hours. No results for input(s): AMMONIA in the last 168 hours. Coagulation Profile: No results for input(s): INR, PROTIME in the last 168 hours. Cardiac Enzymes: No results for input(s): CKTOTAL, CKMB, CKMBINDEX, TROPONINI in the last 168 hours. BNP (last 3 results) No results for input(s): PROBNP in the last 8760 hours. HbA1C: No results for input(s): HGBA1C in the last 72 hours. CBG: Recent Labs  Lab 06/22/19 1228 06/22/19 1631 06/22/19 1632 06/22/19 2125 06/23/19 0634  GLUCAP 134* 212* 221* 177* 117*   Lipid Profile: No results for input(s): CHOL, HDL, LDLCALC, TRIG, CHOLHDL, LDLDIRECT in the last 72 hours. Thyroid Function Tests: No results for input(s): TSH, T4TOTAL, FREET4, T3FREE, THYROIDAB in the last 72 hours. Anemia Panel: No results for input(s): VITAMINB12, FOLATE, FERRITIN, TIBC,  IRON, RETICCTPCT in the last 72 hours. Sepsis Labs: No results for input(s): PROCALCITON, LATICACIDVEN in the last 168 hours.  No results found for this or any previous visit (from the past 240 hour(s)).       Radiology Studies: Dg Chest Port 1 View  Result Date: 06/23/2019 CLINICAL DATA:  Reason for exam: dyspnea Patient preferred not to take necklace off (wedding ring attached). EXAM: PORTABLE CHEST 1 VIEW COMPARISON:  Radiograph 06/19/2019, 05/31/2019 FINDINGS: Stable cardiac silhouette. There is bilateral peripheral airspace disease unchanged from comparison exam. Mild basilar atelectasis. No pleural fluid. No pneumothorax. No acute osseous abnormality. IMPRESSION: Persistent peripheral  severe airspace disease. No significant interval change. Findings suggestive of viral pneumonia. Electronically Signed   By: Suzy Bouchard M.D.   On: 06/23/2019 09:09        Scheduled Meds: . albuterol  2 puff Inhalation TID  . allopurinol  100 mg Oral Daily  . atorvastatin  20 mg Oral q1800  . Chlorhexidine Gluconate Cloth  6 each Topical Daily  . dabigatran  150 mg Oral BID  . finasteride  5 mg Oral Daily  . fluticasone furoate-vilanterol  1 puff Inhalation Daily  . furosemide  40 mg Intravenous Daily  . insulin aspart  0-15 Units Subcutaneous TID WC  . insulin aspart  2-5 Units Subcutaneous QHS  . insulin aspart  4 Units Subcutaneous TID WC  . insulin glargine  20 Units Subcutaneous QHS  . metoprolol tartrate  50 mg Oral Q6H  . midodrine  2.5 mg Oral BID WC  . pantoprazole  40 mg Oral BID AC  . polyethylene glycol  17 g Oral TID  . senna  2 tablet Oral QHS  . sucralfate  1 g Oral TID WC & HS  . tamsulosin  0.4 mg Oral QHS  . umeclidinium bromide  1 puff Inhalation Daily   Continuous Infusions: . amiodarone 30 mg/hr (06/23/19 0346)          Aline August, MD [facility] 06/23/2019, 10:25 AM

## 2019-06-23 NOTE — Plan of Care (Signed)
  Problem: Education: Goal: Knowledge of risk factors and measures for prevention of condition will improve Outcome: Progressing   Problem: Coping: Goal: Psychosocial and spiritual needs will be supported Outcome: Progressing   Problem: Respiratory: Goal: Will maintain a patent airway Outcome: Progressing Goal: Complications related to the disease process, condition or treatment will be avoided or minimized Outcome: Progressing   Problem: Education: Goal: Knowledge of General Education information will improve Description: Including pain rating scale, medication(s)/side effects and non-pharmacologic comfort measures Outcome: Progressing   Problem: Health Behavior/Discharge Planning: Goal: Ability to manage health-related needs will improve Outcome: Progressing   Problem: Clinical Measurements: Goal: Ability to maintain clinical measurements within normal limits will improve Outcome: Progressing Goal: Will remain free from infection Outcome: Progressing Goal: Diagnostic test results will improve Outcome: Progressing Goal: Respiratory complications will improve Outcome: Progressing Goal: Cardiovascular complication will be avoided Outcome: Progressing   Problem: Activity: Goal: Risk for activity intolerance will decrease Outcome: Progressing   Problem: Nutrition: Goal: Adequate nutrition will be maintained Outcome: Progressing   Problem: Coping: Goal: Level of anxiety will decrease Outcome: Progressing   Problem: Elimination: Goal: Will not experience complications related to bowel motility Outcome: Progressing Goal: Will not experience complications related to urinary retention Outcome: Progressing   Problem: Pain Managment: Goal: General experience of comfort will improve Outcome: Progressing   Problem: Safety: Goal: Ability to remain free from injury will improve Outcome: Progressing   Problem: Skin Integrity: Goal: Risk for impaired skin integrity will  decrease Outcome: Progressing   Problem: Education: Goal: Knowledge of General Education information will improve Description: Including pain rating scale, medication(s)/side effects and non-pharmacologic comfort measures Outcome: Progressing   Problem: Health Behavior/Discharge Planning: Goal: Ability to manage health-related needs will improve Outcome: Progressing   Problem: Clinical Measurements: Goal: Ability to maintain clinical measurements within normal limits will improve Outcome: Progressing Goal: Will remain free from infection Outcome: Progressing Goal: Diagnostic test results will improve Outcome: Progressing Goal: Respiratory complications will improve Outcome: Progressing Goal: Cardiovascular complication will be avoided Outcome: Progressing   Problem: Activity: Goal: Risk for activity intolerance will decrease Outcome: Progressing   Problem: Nutrition: Goal: Adequate nutrition will be maintained Outcome: Progressing   Problem: Coping: Goal: Level of anxiety will decrease Outcome: Progressing   Problem: Elimination: Goal: Will not experience complications related to bowel motility Outcome: Progressing Goal: Will not experience complications related to urinary retention Outcome: Progressing   Problem: Pain Managment: Goal: General experience of comfort will improve Outcome: Progressing   Problem: Safety: Goal: Ability to remain free from injury will improve Outcome: Progressing   Problem: Skin Integrity: Goal: Risk for impaired skin integrity will decrease Outcome: Progressing   Problem: Education: Goal: Knowledge of disease or condition will improve Outcome: Progressing Goal: Understanding of medication regimen will improve Outcome: Progressing Goal: Individualized Educational Video(s) Outcome: Progressing   Problem: Activity: Goal: Ability to tolerate increased activity will improve Outcome: Progressing   Problem: Cardiac: Goal:  Ability to achieve and maintain adequate cardiopulmonary perfusion will improve Outcome: Progressing   Problem: Health Behavior/Discharge Planning: Goal: Ability to safely manage health-related needs after discharge will improve Outcome: Progressing

## 2019-06-24 LAB — CBC WITH DIFFERENTIAL/PLATELET
Abs Immature Granulocytes: 0.01 10*3/uL (ref 0.00–0.07)
Basophils Absolute: 0 10*3/uL (ref 0.0–0.1)
Basophils Relative: 0 %
Eosinophils Absolute: 0.3 10*3/uL (ref 0.0–0.5)
Eosinophils Relative: 6 %
HCT: 33 % — ABNORMAL LOW (ref 39.0–52.0)
Hemoglobin: 11 g/dL — ABNORMAL LOW (ref 13.0–17.0)
Immature Granulocytes: 0 %
Lymphocytes Relative: 22 %
Lymphs Abs: 1.1 10*3/uL (ref 0.7–4.0)
MCH: 31.3 pg (ref 26.0–34.0)
MCHC: 33.3 g/dL (ref 30.0–36.0)
MCV: 93.8 fL (ref 80.0–100.0)
Monocytes Absolute: 0.4 10*3/uL (ref 0.1–1.0)
Monocytes Relative: 7 %
Neutro Abs: 3.2 10*3/uL (ref 1.7–7.7)
Neutrophils Relative %: 65 %
Platelets: 89 10*3/uL — ABNORMAL LOW (ref 150–400)
RBC: 3.52 MIL/uL — ABNORMAL LOW (ref 4.22–5.81)
RDW: 15 % (ref 11.5–15.5)
WBC: 4.9 10*3/uL (ref 4.0–10.5)
nRBC: 0 % (ref 0.0–0.2)

## 2019-06-24 LAB — BASIC METABOLIC PANEL
Anion gap: 11 (ref 5–15)
BUN: 19 mg/dL (ref 8–23)
CO2: 27 mmol/L (ref 22–32)
Calcium: 8 mg/dL — ABNORMAL LOW (ref 8.9–10.3)
Chloride: 95 mmol/L — ABNORMAL LOW (ref 98–111)
Creatinine, Ser: 1.53 mg/dL — ABNORMAL HIGH (ref 0.61–1.24)
GFR calc Af Amer: 51 mL/min — ABNORMAL LOW (ref >60)
GFR calc non Af Amer: 44 mL/min — ABNORMAL LOW (ref >60)
Glucose, Bld: 131 mg/dL — ABNORMAL HIGH (ref 70–99)
Potassium: 4 mmol/L (ref 3.5–5.1)
Sodium: 133 mmol/L — ABNORMAL LOW (ref 135–145)

## 2019-06-24 LAB — GLUCOSE, CAPILLARY
Glucose-Capillary: 115 mg/dL — ABNORMAL HIGH (ref 70–99)
Glucose-Capillary: 285 mg/dL — ABNORMAL HIGH (ref 70–99)
Glucose-Capillary: 280 mg/dL — ABNORMAL HIGH (ref 70–99)
Glucose-Capillary: 164 mg/dL — ABNORMAL HIGH (ref 70–99)

## 2019-06-24 LAB — MAGNESIUM: Magnesium: 1.7 mg/dL (ref 1.7–2.4)

## 2019-06-24 MED ORDER — ALBUTEROL SULFATE HFA 108 (90 BASE) MCG/ACT IN AERS: 2.0000 | INHALATION_SPRAY | Freq: Four times a day (QID) | RESPIRATORY_TRACT | Status: DC | PRN

## 2019-06-24 MED ORDER — POLYETHYLENE GLYCOL (MIRALAX) NICU SYRINGE 0.34GM/ML: 0.5000 g/kg | Freq: Two times a day (BID) | ORAL | Status: DC

## 2019-06-24 NOTE — Progress Notes (Signed)
Patient ID: Jay Morris, male   DOB: 1946-07-07, 73 y.o.   MRN: [id number]  PROGRESS NOTE    Jay Morris  UYQ:[id number] DOB: [birth year] DOA: 06/02/2019 PCP: [name], MD   Brief Narrative:  73 year old male with history of COPD, bronchial asthma, paroxysmal A. fib on Pradaxa, diabetes mellitus type 2, hypertension presented with fevers, chills and worsening shortness of breath on 06/02/2019 and was found to have acute hypoxic respiratory failure secondary to Covid pneumonia.  Hospital course was complicated by progressively worsening hypoxemia requiring 15 L of high flow oxygen along with A. fib with RVR.  He was treated with steroids, remdesivir, convalescent plasma with slow clinical improvement.  Further hospital course has been lately complicated by acute urinary retention requiring Foley catheter placement, constipation/fecal traction requiring laxative/enema, symptomatic orthostatic hypotension and A. fib/flutter with RVR requiring amiodarone/Cardizem infusions.  Due to ongoing difficulties with rate control, patient was transferred to [facility] from [facility] on 06/21/2019 as per cardiology recommendations.  Patient's first COVID-19 test was on 05/30/2019 at [facility] (results available in [place]).  Patient has completed treatment for Covid pneumonia.  Prior hospitalist spoke with Dr. Luvenia Heller and his isolation has been discontinued as he is more than 3 weeks from his first test.  Assessment & Plan:   Acute hypoxic respiratory failure due to COVID-19 viral pneumonia -Patient had required up to 15 L of high flow oxygen -Much improved.  Patient is still fluctuating between 2 to 4 L oxygen via nasal cannula over the last few days.  Chest x-ray from 06/23/2019 showed findings suggestive of viral pneumonia but no significant change. -Wean off as able.  Incentive spirometry.  Patient will need to be evaluated for home oxygen requirement. -Patient has already completed course  of steroids and remdesivir.  He also received convalescent plasma during this hospitalization on 06/03/2019.  Paroxysmal a flutter/A. fib with RVR -Cardiology following.  Status post cardioversion on 06/22/2019.  Amiodarone drip has been switched to oral amiodarone and cardiology recommends to continue 200 mg twice a day for 7 days then 200 mg daily for 21 days to complete a full month of therapy and then stop.  Pradaxa has been switched to Eliquis by cardiology because of drug drug interaction.  Acute on chronic diastolic heart failure -Likely secondary to RVR.  Currently still on IV Lasix.  Hopefully can switch to oral Lasix soon.  Will follow cardiology recommendations regarding the same.  Strict input and output.  Daily weights.   Will need to use caution with diuresis with history of chronic orthostatic hypotension  Constipation Abdominal pain probably from above -CT of the abdomen on 06/17/2019 did not show any acute abnormalities.  Abdominal pain has resolved with laxatives.  Continue current bowel regimen  Acute urinary retention BPH -Status post Foley catheter placement on 06/15/2019.  Flomax was increased to 0.8 mg and finasteride was added.  Voiding trial on 06/17/2019 was unsuccessful.  Foley catheter was reinserted on 06/19/2019.  Will need outpatient urology evaluation and follow-up for voiding trial.  Because of orthostatic hypotension, Flomax was decreased back to 0.4 mg daily.  Chronic orthostatic hypotension  -Continue low-dose midodrine.  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  Creatinine back to baseline.  Monitor intermittently  Thrombocytopenia -Questionable cause.  No signs of bleeding.  Monitor  Diabetes mellitus type 2 uncontrolled with hyperglycemia -Continue CBGs with SSI along with Lantus  Hypertension -Continue metoprolol/Lasix  COPD-stable.  Continue bronchodilators  Deconditioning/debility -  PT/OT recommend SNF.  Family  prefers home with home health services.   DVT prophylaxis: Pradaxa Code Status: Full Family Communication: Spoke to patient at bedside Disposition Plan: Home with home health in 1 to 2 days once cleared by cardiology  Consultants: Cardiology  Procedures: None  Antimicrobials: None   Subjective: Patient seen and examined at bedside.  Poor historian.  Patient feels slightly better.  He wants to go home but not sure if he is ready today or not.  No worsening shortness of breath, fever, nausea or vomiting.  Had bowel movement yesterday.  Still feels weak and tired.  Still refusing nursing home placement. Objective: Vitals:   06/24/19 0422 06/24/19 0735 06/24/19 0823 06/24/19 0940  BP: (!) 117/57  (!) 118/54 119/60  Pulse: 60   71  Resp: 20     Temp: 97.8 F (36.6 C)  (!) 97.4 F (36.3 C)   TempSrc: Oral  Axillary   SpO2: 92% 92%    Weight:      Height:        Intake/Output Summary (Last 24 hours) at 06/24/2019 1059 Last data filed at 06/24/2019 0900 Gross per 24 hour  Intake 869.8 ml  Output 2002 ml  Net -1132.2 ml   Filed Weights   06/02/19 0445 06/21/19 2033  Weight: 85.8 kg 78.1 kg    Examination:  General exam: No acute distress.  Elderly male lying in bed.  Looks chronically ill.  Poor historian. Respiratory system: Bilateral decreased breath sounds at bases with basilar crackles.  No wheezing  cardiovascular system: S1-S2 heard, rate controlled Gastrointestinal system: Abdomen is nondistended, soft and nontender. Normal bowel sounds heard. Extremities: No cyanosis; trace edema   Data Reviewed: I have personally reviewed following labs and imaging studies  CBC: Recent Labs  Lab 06/20/19 0001 06/21/19 0715 06/22/19 0753 06/23/19 0812 06/24/19 0351  WBC 7.3 5.9 5.8 5.1 4.9  NEUTROABS  --   --   --  3.9 3.2  HGB 13.0 12.5* 12.3* 11.9* 11.0*  HCT 39.9 37.5* 36.0* 36.6* 33.0*  MCV 96.4 94.9 93.3 95.3 93.8  PLT 99* 101* 91* 85* 89*   Basic Metabolic  Panel: Recent Labs  Lab 06/20/19 0001 06/21/19 0715 06/22/19 0753 06/23/19 0812 06/24/19 0351  NA 135 133* 132* 133* 133*  K 4.0 4.1 4.0 4.0 4.0  CL 98 95* 96* 98 95*  CO2 _0 GLUCOSE 95 170* 127* 131* 131*  BUN 19 24* 21 24* 19  CREATININE 1.28* 1.42* 1.41* 1.41* 1.53*  CALCIUM 8.1* 8.1* 8.1* 7.9* 8.0*  MG  --   --   --  1.8 1.7   GFR: Estimated Creatinine Clearance: 40.4 mL/min (A) (by C-G formula based on SCr of 1.53 mg/dL (H)). Liver Function Tests: Recent Labs  Lab 06/18/19 1645 06/19/19 0341 06/20/19 0001 06/22/19 0753 06/23/19 0812  AST _1 ALT _2 ALKPHOS 30* 28* 32* 32* 31*  BILITOT 1.3* 0.8 1.1 1.1 1.4*  PROT 5.2* 5.2* 5.6* 5.5* 5.3*  ALBUMIN 2.3* 2.3* 2.4* 2.2* 2.1*   No results for input(s): LIPASE, AMYLASE in the last 168 hours. No results for input(s): AMMONIA in the last 168 hours. Coagulation Profile: No results for input(s): INR, PROTIME in the last 168 hours. Cardiac Enzymes: No results for input(s): CKTOTAL, CKMB, CKMBINDEX, TROPONINI in the last 168 hours. BNP (last 3 results) No results for input(s): PROBNP in the last 8760 hours. HbA1C:  No results for input(s): HGBA1C in the last 72 hours. CBG: Recent Labs  Lab 06/23/19 0634 06/23/19 1152 06/23/19 1717 06/23/19 2121 06/24/19 0615  GLUCAP 117* 223* 173* 179* 115*   Lipid Profile: No results for input(s): CHOL, HDL, LDLCALC, TRIG, CHOLHDL, LDLDIRECT in the last 72 hours. Thyroid Function Tests: No results for input(s): TSH, T4TOTAL, FREET4, T3FREE, THYROIDAB in the last 72 hours. Anemia Panel: No results for input(s): VITAMINB12, FOLATE, FERRITIN, TIBC, IRON, RETICCTPCT in the last 72 hours. Sepsis Labs: No results for input(s): PROCALCITON, LATICACIDVEN in the last 168 hours.  No results found for this or any previous visit (from the past 240 hour(s)).       Radiology Studies: Dg Chest Port 1 View  Result Date: 06/23/2019 CLINICAL DATA:   Reason for exam: dyspnea Patient preferred not to take necklace off (wedding ring attached). EXAM: PORTABLE CHEST 1 VIEW COMPARISON:  Radiograph 06/19/2019, 05/31/2019 FINDINGS: Stable cardiac silhouette. There is bilateral peripheral airspace disease unchanged from comparison exam. Mild basilar atelectasis. No pleural fluid. No pneumothorax. No acute osseous abnormality. IMPRESSION: Persistent peripheral severe airspace disease. No significant interval change. Findings suggestive of viral pneumonia. Electronically Signed   By: Suzy Bouchard M.D.   On: 06/23/2019 09:09        Scheduled Meds: . albuterol  2 puff Inhalation TID  . allopurinol  100 mg Oral Daily  . amiodarone  200 mg Oral BID  . apixaban  5 mg Oral BID  . atorvastatin  20 mg Oral q1800  . Chlorhexidine Gluconate Cloth  6 each Topical Daily  . finasteride  5 mg Oral Daily  . fluticasone furoate-vilanterol  1 puff Inhalation Daily  . furosemide  40 mg Intravenous Daily  . insulin aspart  0-15 Units Subcutaneous TID WC  . insulin aspart  2-5 Units Subcutaneous QHS  . insulin aspart  4 Units Subcutaneous TID WC  . insulin glargine  20 Units Subcutaneous QHS  . metoprolol tartrate  25 mg Oral BID  . midodrine  2.5 mg Oral BID WC  . pantoprazole  40 mg Oral BID AC  . polyethylene glycol  17 g Oral TID  . senna  2 tablet Oral QHS  . sucralfate  1 g Oral TID WC & HS  . tamsulosin  0.4 mg Oral QHS  . umeclidinium bromide  1 puff Inhalation Daily   Continuous Infusions:         Aline August, MD [facility] 06/24/2019, 10:59 AM

## 2019-06-24 NOTE — Progress Notes (Signed)
SATURATION QUALIFICATIONS: (This note is used to comply with regulatory documentation for home oxygen)  Patient Saturations on Room Air at Rest = 85%  Patient Saturations on Room Air while Ambulating = 63 %  Patient Saturations on  4 Liters of oxygen while Ambulating = 72%  Please briefly explain why patient needs home oxygen: Mr. Berenson room air saturation prior to walking was 85%. He walked approximately 88 feet and his oxygen saturation immediately feel to between 63.  Immediately placed on the 4 liters and oxygen saturation came up to 72 %.  It took almost 40 minutes for Mr. Cohenour to bring his oxygen saturation up to the low 90's while on 4 liters of oxygen and while resting.

## 2019-06-24 NOTE — Progress Notes (Signed)
Progress Note  Patient Name: Jay Morris Date of Encounter: 06/24/2019  Primary Cardiologist:   No primary care provider on file.   Subjective   Feels well today, no complaints.  Inpatient Medications    Scheduled Meds: . allopurinol  100 mg Oral Daily  . amiodarone  200 mg Oral BID  . apixaban  5 mg Oral BID  . atorvastatin  20 mg Oral q1800  . Chlorhexidine Gluconate Cloth  6 each Topical Daily  . finasteride  5 mg Oral Daily  . fluticasone furoate-vilanterol  1 puff Inhalation Daily  . furosemide  40 mg Intravenous Daily  . insulin aspart  0-15 Units Subcutaneous TID WC  . insulin aspart  2-5 Units Subcutaneous QHS  . insulin aspart  4 Units Subcutaneous TID WC  . insulin glargine  20 Units Subcutaneous QHS  . metoprolol tartrate  25 mg Oral BID  . midodrine  2.5 mg Oral BID WC  . pantoprazole  40 mg Oral BID AC  . polyethylene glycol  17 g Oral TID  . senna  2 tablet Oral QHS  . sucralfate  1 g Oral TID WC & HS  . tamsulosin  0.4 mg Oral QHS  . umeclidinium bromide  1 puff Inhalation Daily   Continuous Infusions:  PRN Meds: acetaminophen, albuterol, alum & mag hydroxide-simeth, bisacodyl, chlorpheniramine-HYDROcodone, cyclobenzaprine, diphenhydrAMINE, guaiFENesin-dextromethorphan, lactulose, ondansetron **OR** ondansetron (ZOFRAN) IV, promethazine, sodium chloride   Vital Signs    Vitals:   06/24/19 0735 06/24/19 0823 06/24/19 0940 06/24/19 1214  BP:  (!) 118/54 119/60 121/61  Pulse:   71 67  Resp:    (!) 24  Temp:  (!) 97.4 F (36.3 C)  (!) 97.5 F (36.4 C)  TempSrc:  Axillary  Oral  SpO2: 92%   91%  Weight:      Height:        Intake/Output Summary (Last 24 hours) at 06/24/2019 1220 Last data filed at 06/24/2019 1100 Gross per 24 hour  Intake 1043.12 ml  Output 2002 ml  Net -958.88 ml   Filed Weights   06/02/19 0445 06/21/19 2033  Weight: 85.8 kg 78.1 kg    Telemetry    NSR - Personally Reviewed  ECG    NA - Personally Reviewed   Physical Exam   Constitutional: No acute distress Eyes: sclera non-icteric, normal conjunctiva and lids ENMT: normal dentition, moist mucous membranes Cardiovascular: regular rhythm, normal rate, no murmurs. S1 and S2 normal. Radial pulses normal bilaterally. No jugular venous distention.  Respiratory: clear to auscultation bilaterally GI : normal bowel sounds, soft and nontender. No distention.   MSK: extremities warm, well perfused. No edema.  NEURO: grossly nonfocal exam, moves all extremities. PSYCH: alert and oriented x 3, normal mood and affect.   Labs    Chemistry Recent Labs  Lab 06/20/19 0001  06/22/19 0753 06/23/19 0812 06/24/19 0351  NA 135   < > 132* 133* 133*  K 4.0   < > 4.0 4.0 4.0  CL 98   < > 96* 98 95*  CO2 25   < > _0 GLUCOSE 95   < > 127* 131* 131*  BUN 19   < > 21 24* 19  CREATININE 1.28*   < > 1.41* 1.41* 1.53*  CALCIUM 8.1*   < > 8.1* 7.9* 8.0*  PROT 5.6*  --  5.5* 5.3*  --   ALBUMIN 2.4*  --  2.2* 2.1*  --   AST 22  --  24 27  --   ALT 28  --  24 26  --   ALKPHOS 32*  --  32* 31*  --   BILITOT 1.1  --  1.1 1.4*  --   GFRNONAA 54*   < > 48* 48* 44*  GFRAA >60   < > 56* 56* 51*  ANIONGAP 12   < > _0 < > = values in this interval not displayed.     Hematology Recent Labs  Lab 06/22/19 0753 06/23/19 0812 06/24/19 0351  WBC 5.8 5.1 4.9  RBC 3.86* 3.84* 3.52*  HGB 12.3* 11.9* 11.0*  HCT 36.0* 36.6* 33.0*  MCV 93.3 95.3 93.8  MCH 31.9 31.0 31.3  MCHC 34.2 32.5 33.3  RDW 14.8 15.0 15.0  PLT 91* 85* 89*    Cardiac EnzymesNo results for input(s): TROPONINI in the last 168 hours. No results for input(s): TROPIPOC in the last 168 hours.   BNP Recent Labs  Lab 06/19/19 0341  BNP 133.1*     DDimer No results for input(s): DDIMER in the last 168 hours.   Radiology    Dg Chest Port 1 View  Result Date: 06/23/2019 CLINICAL DATA:  Reason for exam: dyspnea Patient preferred not to take necklace off (wedding ring attached).  EXAM: PORTABLE CHEST 1 VIEW COMPARISON:  Radiograph 06/19/2019, 05/31/2019 FINDINGS: Stable cardiac silhouette. There is bilateral peripheral airspace disease unchanged from comparison exam. Mild basilar atelectasis. No pleural fluid. No pneumothorax. No acute osseous abnormality. IMPRESSION: Persistent peripheral severe airspace disease. No significant interval change. Findings suggestive of viral pneumonia. Electronically Signed   By: Suzy Bouchard M.D.   On: 06/23/2019 09:09    Cardiac Studies   ECHO    1. Left ventricular ejection fraction, by visual estimation, is 60 to 65%. The left ventricle has normal function. Left ventricular septal wall thickness was mildly increased. Mildly increased left ventricular posterior wall thickness. There is mildly  increased left ventricular hypertrophy.  2. Global right ventricle has normal systolic function.The right ventricular size is mildly enlarged. No increase in right ventricular wall thickness.  3. Left atrial size was normal.  4. Right atrial size was mildly dilated.  5. The mitral valve was not well visualized. Trace mitral valve regurgitation.  6. The tricuspid valve is not well visualized. Tricuspid valve regurgitation is mild.  7. The aortic valve is grossly normal. Aortic valve regurgitation is trivial.  8. The pulmonic valve was not well visualized. Pulmonic valve regurgitation is trivial.  9. The aortic root was not well visualized. 10. Mildly elevated pulmonary artery systolic pressure. 11. The atrial septum is grossly normal.  Patient Profile     73 y.o. male with COPD, paroxysmal atrial fibrillation status post cardiac ablation at [facility] 20 years ago, on anticoagulation with no interrupted dosing, diabetes, hypertension who was initially admitted to [facility] on 11/14 for Covid pneumonia.  His course there was complicated by A. fib with RVR as well as atrial flutter.  He was transferred here on 12/3 for management of his A. Fib.   He is now post cardioversion.    Assessment & Plan    1. Atrial fibrillation: - Status post DCCV - Continue amiodarone 200 mg twice daily for 7 days and then 200 mg daily for 21 days. Plan for 1 month of therapy then stop, without plans to continue amiodarone long term.  - Continue metoprolol 25 mg BID -Transitioned to Eliquis from pradaxa for drug interaction, tolerating well.  2. Chronic diastolic HF - continues on lasix 40 mg IV daily. Transition to lasix 40 mg po BID tomorrow since dose has already been given today. Should have BMET in 2-3 days after dismissal, if Cr continues to rise, hold or decrease lasix and reassess. Appears euvolemic on exam.   For questions or updates, please contact [place] Please consult [email or web address] for contact info under Cardiology/STEMI.   Signed, Elouise Munroe, MD  06/24/2019, 12:20 PM

## 2019-06-25 ENCOUNTER — Encounter (HOSPITAL_COMMUNITY): Payer: Self-pay | Admitting: Internal Medicine

## 2019-06-25 DIAGNOSIS — I10 Essential (primary) hypertension: Secondary | ICD-10-CM

## 2019-06-25 LAB — CBC WITH DIFFERENTIAL/PLATELET
Abs Immature Granulocytes: 0.01 10*3/uL (ref 0.00–0.07)
Basophils Absolute: 0 10*3/uL (ref 0.0–0.1)
Basophils Relative: 1 %
Eosinophils Absolute: 0.4 10*3/uL (ref 0.0–0.5)
Eosinophils Relative: 9 %
HCT: 32.4 % — ABNORMAL LOW (ref 39.0–52.0)
Hemoglobin: 11 g/dL — ABNORMAL LOW (ref 13.0–17.0)
Immature Granulocytes: 0 %
Lymphocytes Relative: 26 %
Lymphs Abs: 1.1 10*3/uL (ref 0.7–4.0)
MCH: 31.7 pg (ref 26.0–34.0)
MCHC: 34 g/dL (ref 30.0–36.0)
MCV: 93.4 fL (ref 80.0–100.0)
Monocytes Absolute: 0.4 10*3/uL (ref 0.1–1.0)
Monocytes Relative: 9 %
Neutro Abs: 2.3 10*3/uL (ref 1.7–7.7)
Neutrophils Relative %: 55 %
Platelets: 90 10*3/uL — ABNORMAL LOW (ref 150–400)
RBC: 3.47 MIL/uL — ABNORMAL LOW (ref 4.22–5.81)
RDW: 14.7 % (ref 11.5–15.5)
WBC: 4.2 10*3/uL (ref 4.0–10.5)
nRBC: 0 % (ref 0.0–0.2)

## 2019-06-25 LAB — BASIC METABOLIC PANEL
Anion gap: 10 (ref 5–15)
BUN: 14 mg/dL (ref 8–23)
CO2: 28 mmol/L (ref 22–32)
Calcium: 8.1 mg/dL — ABNORMAL LOW (ref 8.9–10.3)
Chloride: 98 mmol/L (ref 98–111)
Creatinine, Ser: 1.42 mg/dL — ABNORMAL HIGH (ref 0.61–1.24)
GFR calc Af Amer: 56 mL/min — ABNORMAL LOW (ref >60)
GFR calc non Af Amer: 48 mL/min — ABNORMAL LOW (ref >60)
Glucose, Bld: 122 mg/dL — ABNORMAL HIGH (ref 70–99)
Potassium: 3.8 mmol/L (ref 3.5–5.1)
Sodium: 136 mmol/L (ref 135–145)

## 2019-06-25 LAB — GLUCOSE, CAPILLARY
Glucose-Capillary: 110 mg/dL — ABNORMAL HIGH (ref 70–99)
Glucose-Capillary: 248 mg/dL — ABNORMAL HIGH (ref 70–99)
Glucose-Capillary: 175 mg/dL — ABNORMAL HIGH (ref 70–99)

## 2019-06-25 LAB — MAGNESIUM: Magnesium: 1.7 mg/dL (ref 1.7–2.4)

## 2019-06-25 MED ORDER — SUCRALFATE 1 GM/10ML PO SUSP: 1.0000 g | Freq: Three times a day (TID) | ORAL | 0 refills | Status: DC

## 2019-06-25 MED ORDER — INSULIN GLARGINE 100 UNIT/ML ~~LOC~~ SOLN: 20.0000 [IU] | Freq: Every day | SUBCUTANEOUS | 0 refills | Status: DC

## 2019-06-25 MED ORDER — LACTULOSE 10 GM/15ML PO SOLN: 20.0000 g | Freq: Three times a day (TID) | ORAL | 0 refills | Status: DC | PRN

## 2019-06-25 MED ORDER — BISACODYL 10 MG RE SUPP: 10.0000 mg | Freq: Every day | RECTAL | 0 refills | Status: DC | PRN

## 2019-06-25 MED ORDER — PANTOPRAZOLE SODIUM 40 MG PO TBEC: 40.0000 mg | DELAYED_RELEASE_TABLET | Freq: Two times a day (BID) | ORAL | 0 refills | Status: AC

## 2019-06-25 MED ORDER — MIDODRINE HCL 2.5 MG PO TABS: 2.5000 mg | ORAL_TABLET | Freq: Two times a day (BID) | ORAL | 0 refills | Status: DC

## 2019-06-25 MED ORDER — ALBUTEROL SULFATE HFA 108 (90 BASE) MCG/ACT IN AERS: 2.0000 | INHALATION_SPRAY | RESPIRATORY_TRACT | 0 refills | Status: DC | PRN

## 2019-06-25 MED ORDER — FUROSEMIDE 40 MG PO TABS
40.0000 mg | ORAL_TABLET | Freq: Two times a day (BID) | ORAL | Status: DC
  Administered 2019-06-25 (×2): 40 mg via ORAL
  Filled 2019-06-25 (×2): qty 1

## 2019-06-25 MED ORDER — SENNA 8.6 MG PO TABS: 2.0000 | ORAL_TABLET | Freq: Two times a day (BID) | ORAL | 0 refills | Status: DC

## 2019-06-25 MED ORDER — APIXABAN 5 MG PO TABS: 5.0000 mg | ORAL_TABLET | Freq: Two times a day (BID) | ORAL | 0 refills | Status: DC

## 2019-06-25 MED ORDER — FINASTERIDE 5 MG PO TABS: 5.0000 mg | ORAL_TABLET | Freq: Every day | ORAL | 0 refills | Status: DC

## 2019-06-25 MED ORDER — FUROSEMIDE 40 MG PO TABS: 40.0000 mg | ORAL_TABLET | Freq: Two times a day (BID) | ORAL | 0 refills | Status: DC

## 2019-06-25 MED ORDER — GUAIFENESIN-DM 100-10 MG/5ML PO SYRP: 10.0000 mL | ORAL_SOLUTION | ORAL | 0 refills | Status: DC | PRN

## 2019-06-25 MED ORDER — AMIODARONE HCL 200 MG PO TABS: ORAL_TABLET | ORAL | 0 refills | Status: DC

## 2019-06-25 MED ORDER — FUROSEMIDE 40 MG PO TABS: 40.0000 mg | ORAL_TABLET | Freq: Every day | ORAL | 0 refills | Status: DC

## 2019-06-25 MED ORDER — POLYETHYLENE GLYCOL 3350 17 G PO PACK: 17.0000 g | PACK | Freq: Two times a day (BID) | ORAL | 0 refills | Status: DC

## 2019-06-25 NOTE — Plan of Care (Signed)
  Problem: Education: Goal: Knowledge of risk factors and measures for prevention of condition will improve Outcome: Completed/Met   Problem: Coping: Goal: Psychosocial and spiritual needs will be supported Outcome: Completed/Met   Problem: Respiratory: Goal: Will maintain a patent airway Outcome: Completed/Met Goal: Complications related to the disease process, condition or treatment will be avoided or minimized Outcome: Completed/Met   Problem: Education: Goal: Knowledge of General Education information will improve Description: Including pain rating scale, medication(s)/side effects and non-pharmacologic comfort measures Outcome: Completed/Met   Problem: Health Behavior/Discharge Planning: Goal: Ability to manage health-related needs will improve Outcome: Completed/Met   Problem: Clinical Measurements: Goal: Ability to maintain clinical measurements within normal limits will improve Outcome: Completed/Met Goal: Will remain free from infection Outcome: Completed/Met Goal: Diagnostic test results will improve Outcome: Completed/Met Goal: Respiratory complications will improve Outcome: Completed/Met Goal: Cardiovascular complication will be avoided Outcome: Completed/Met   Problem: Activity: Goal: Risk for activity intolerance will decrease Outcome: Completed/Met   Problem: Nutrition: Goal: Adequate nutrition will be maintained Outcome: Completed/Met   Problem: Coping: Goal: Level of anxiety will decrease Outcome: Completed/Met   Problem: Elimination: Goal: Will not experience complications related to bowel motility Outcome: Completed/Met Goal: Will not experience complications related to urinary retention Outcome: Completed/Met   Problem: Pain Managment: Goal: General experience of comfort will improve Outcome: Completed/Met   Problem: Safety: Goal: Ability to remain free from injury will improve Outcome: Completed/Met   Problem: Safety: Goal: Ability to  remain free from injury will improve Outcome: Completed/Met   Problem: Skin Integrity: Goal: Risk for impaired skin integrity will decrease Outcome: Completed/Met   Problem: Education: Goal: Knowledge of General Education information will improve Description: Including pain rating scale, medication(s)/side effects and non-pharmacologic comfort measures Outcome: Completed/Met   Problem: Health Behavior/Discharge Planning: Goal: Ability to manage health-related needs will improve Outcome: Completed/Met   Problem: Clinical Measurements: Goal: Ability to maintain clinical measurements within normal limits will improve Outcome: Completed/Met Goal: Will remain free from infection Outcome: Completed/Met Goal: Diagnostic test results will improve Outcome: Completed/Met Goal: Respiratory complications will improve Outcome: Completed/Met Goal: Cardiovascular complication will be avoided Outcome: Completed/Met   Problem: Activity: Goal: Risk for activity intolerance will decrease Outcome: Completed/Met   Problem: Nutrition: Goal: Adequate nutrition will be maintained Outcome: Completed/Met   Problem: Elimination: Goal: Will not experience complications related to bowel motility Outcome: Completed/Met Goal: Will not experience complications related to urinary retention Outcome: Completed/Met   Problem: Pain Managment: Goal: General experience of comfort will improve Outcome: Completed/Met   Problem: Skin Integrity: Goal: Risk for impaired skin integrity will decrease Outcome: Completed/Met   Problem: Safety: Goal: Ability to remain free from injury will improve Outcome: Completed/Met   Problem: Education: Goal: Knowledge of disease or condition will improve Outcome: Completed/Met Goal: Understanding of medication regimen will improve Outcome: Completed/Met Goal: Individualized Educational Video(s) Outcome: Completed/Met   Problem: Activity: Goal: Ability to tolerate  increased activity will improve Outcome: Completed/Met   Problem: Cardiac: Goal: Ability to achieve and maintain adequate cardiopulmonary perfusion will improve Outcome: Completed/Met   Problem: Health Behavior/Discharge Planning: Goal: Ability to safely manage health-related needs after discharge will improve Outcome: Completed/Met

## 2019-06-25 NOTE — TOC Transition Note (Addendum)
Transition of Care [facility]) - CM/SW Discharge Note   Patient Details  Name: Jay Morris MRN: [id number] Date of Birth: Jan 26, 1946  [facility] CM/SW Contact:  Rae Mar, RN Phone Number: 06/25/2019, 1:42 PM   Clinical Narrative:     CM contacted [facility], [facility], [facility], [facility], and [facility] who could not accept for [facility] RN PT OT NA  due to insurance or staffing.  CM contacted [place] with [facility] who advised they could accept patient for services but start date would not be until 12/11 maybe 12/10.  CM updated Dr. Starla Link and primary RN. Contact information placed on AVS.  CM contacted Zack with [employer] who delivered O2 for home use, walker, and 3-n-1.  RN requested Eliquis card.  Pt will travel home with spouse.  No further TOC needs noted at this time.  Final next level of care: [place] Barriers to Discharge: Barriers Resolved   Patient Goals and CMS Choice Patient states their goals for this hospitalization and ongoing recovery are:: Go home CMS Medicare.gov Compare Post Acute Care list provided to:: Patient Choice offered to / list presented to : Spouse, Adult Children, Patient  Discharge Placement                       Discharge Plan and Services   Discharge Planning Services: CM Consult Post Acute Care Choice: [place]          DME Arranged: Oxygen DME Agency: [facility] Date DME Agency Contacted: 06/25/19 Time DME Agency Contacted: [id number] Representative spoke with at DME Agency: [place]: RN, PT, OT, Nurse's Aide [place] Agency: [facility] Date [place]: 06/25/19 Time [place]: 1341 Representative spoke with at [place]: [place] ([place]) Interventions     Readmission Risk Interventions No flowsheet data found.

## 2019-06-25 NOTE — Discharge Summary (Signed)
Physician Discharge Summary  Linda Biehn ZYS:[id number] DOB: 11-18-1945 DOA: 06/02/2019  PCP: [name], MD  Admit date: 06/02/2019 Discharge date: 06/25/2019  Admitted From: Home Disposition: Home  Recommendations for Outpatient Follow-up:  1. Follow up with PCP in 1 week with repeat CBC/BMP 2. Outpatient follow-up with cardiology/pulmonary and urology 3. Follow up in ED if symptoms worsen or new appear   Home Health: PT/OT/RN.  Patient/family refused SNF Equipment/Devices: Oxygen via nasal cannula at 4 L/min  Discharge Condition: Guarded  CODE STATUS: Full Diet recommendation: Heart healthy/carb modified  Brief/Interim Summary: 73 year old male with history of COPD, bronchial asthma, paroxysmal A. fib on Pradaxa, diabetes mellitus type 2, hypertension presented with fevers, chills and worsening shortness of breath on 06/02/2019 and was found to have acute hypoxic respiratory failure secondary to Covid pneumonia.  Hospital course was complicated by progressively worsening hypoxemia requiring 15 L of high flow oxygen along with A. fib with RVR.  He was treated with steroids, remdesivir, convalescent plasma with slow clinical improvement.  Further hospital course has been lately complicated by acute urinary retention requiring Foley catheter placement, constipation/fecal traction requiring laxative/enema, symptomatic orthostatic hypotension and A. fib/flutter with RVR requiring amiodarone/Cardizem infusions.  Due to ongoing difficulties with rate control, patient was transferred to [facility] from [facility] on 06/21/2019 as per cardiology recommendations.  Patient's first COVID-19 test was on 05/30/2019 at [facility] (results available in [place]).  Patient has completed treatment for Covid pneumonia.  Prior hospitalist spoke with Dr. Luvenia Heller and his isolation has been discontinued as he is more than 3 weeks from his first test.  After transfer to [facility],  patient underwent cardioversion.  Subsequently, he remained in sinus rhythm.  Amiodarone drip has been changed to amiodarone orally by cardiology.  IV Lasix has been changed to oral Lasix per cardiologist.  Cardiology has signed off with outpatient follow-up with cardiology.  Patient feels better but is still requiring oxygen via nasal cannula at 4 L/min.  She will be discharged home on the same oxygen.  He will need outpatient follow-up with cardiology/pulmonary/urology.  Discharge Diagnoses:  Acute hypoxic respiratory failure due to COVID-19 viral pneumonia -Patient had required up to 15 L of high flow oxygen -Much improved.    Currently requiring 4 L oxygen via nasal cannula over the last few days. Chest x-ray from 06/23/2019 showed findings suggestive of viral pneumonia but no significant change. -We will discharge on 4 L oxygen by nasal cannula.  Oxygen saturation dropped to less than 80s on room air on ambulation. -Patient has already completed course of steroids and remdesivir.  He also received convalescent plasma during this hospitalization on 06/03/2019. -Continue incentive spirometry at home.  Outpatient follow-up with pulmonary.  Paroxysmal a flutter/A. fib with RVR -Cardiology following.  Status post cardioversion on 06/22/2019.  Amiodarone drip has been switched to oral amiodarone and cardiology recommends to continue 200 mg twice a day for 7 days then 200 mg daily for 21 days to complete a full month of therapy and then stop.  Pradaxa has been switched to Eliquis by cardiology because of drug drug interaction. -Outpatient follow-up with cardiology.  Acute on chronic diastolic heart failure -Likely secondary to RVR.    Treated with intravenous Lasix.  Will need to use caution with diuresis with history of chronic orthostatic hypotension -Patient has had good diuresis. -Cardiology is recommending to discharge home on Lasix 40 mg daily.  Outpatient follow-up with  cardiology.  Constipation Abdominal pain probably from  above -CT of the abdomen on 06/17/2019 did not show any acute abnormalities.  Abdominal pain has resolved with laxatives.  Continue current bowel regimen upon discharge.  Acute urinary retention BPH -Status post Foley catheter placement on 06/15/2019.  Flomax was increased to 0.8 mg and finasteride was added.  Voiding trial on 06/17/2019 was unsuccessful.  Foley catheter was reinserted on 06/19/2019.  Will need outpatient urology evaluation and follow-up for voiding trial.  Because of orthostatic hypotension, Flomax was decreased back to 0.4 mg daily.  Chronic orthostatic hypotension  -Continue low-dose midodrine.    AKI on chronic kidney disease stage IIIa -Improved.  Creatinine back to baseline.  Monitor intermittently  Thrombocytopenia -Questionable cause.  No signs of bleeding.  Monitor  Diabetes mellitus type 2 uncontrolled with hyperglycemia -Continue Lantus.  Carb modified diet.  Outpatient follow-up  Hypertension -Continue metoprolol/Lasix  COPD-stable.    Outpatient follow-up  Deconditioning/debility -PT/OT recommend SNF.    Will need home health PT/OT/RN upon discharge.    Discharge Instructions  Discharge Instructions    (HEART FAILURE PATIENTS) Call MD:  Anytime you have any of the following symptoms: 1) 3 pound weight gain in 24 hours or 5 pounds in 1 week 2) shortness of breath, with or without a dry hacking cough 3) swelling in the hands, feet or stomach 4) if you have to sleep on extra pillows at night in order to breathe.   Complete by: As directed    Ambulatory referral to Cardiology   Complete by: As directed    Ambulatory referral to Pulmonology   Complete by: As directed    Ambulatory referral to Urology   Complete by: As directed    Urinary retention   Diet - low sodium heart healthy   Complete by: As directed    Diet - low sodium heart healthy   Complete by: As directed    Diet Carb  Modified   Complete by: As directed    Diet Carb Modified   Complete by: As directed    Increase activity slowly   Complete by: As directed    Increase activity slowly   Complete by: As directed      Allergies as of 06/25/2019      Reactions   Shrimp [shellfish Allergy] Anaphylaxis      Medication List    STOP taking these medications   indomethacin 50 MG capsule Commonly known as: INDOCIN   levofloxacin 750 MG tablet Commonly known as: LEVAQUIN   metFORMIN 1000 MG tablet Commonly known as: GLUCOPHAGE   omeprazole 40 MG capsule Commonly known as: PRILOSEC   Pradaxa 150 MG Caps capsule Generic drug: dabigatran   Tresiba FlexTouch 100 UNIT/ML Sopn FlexTouch Pen Generic drug: insulin degludec   vitamin B-12 1000 MCG tablet Commonly known as: CYANOCOBALAMIN     TAKE these medications   acetaminophen 325 MG tablet Commonly known as: TYLENOL Take 2 tablets (650 mg total) by mouth every 6 (six) hours as needed for mild pain or headache (fever >/= 101).   albuterol 108 (90 Base) MCG/ACT inhaler Commonly known as: VENTOLIN HFA Inhale 2 puffs into the lungs every 4 (four) hours as needed for wheezing or shortness of breath.   allopurinol 100 MG tablet Commonly known as: ZYLOPRIM Take 100 mg by mouth daily.   alum & mag hydroxide-simeth 200-200-20 MG/5ML suspension Commonly known as: MAALOX/MYLANTA Take 15 mLs by mouth every 6 (six) hours as needed for indigestion or heartburn.   amiodarone 200 MG tablet Commonly  known as: PACERONE 232m BID for 7days, then 2068mdaily for 21 days then stop   apixaban 5 MG Tabs tablet Commonly known as: ELIQUIS Take 1 tablet (5 mg total) by mouth 2 (two) times daily.   atorvastatin 20 MG tablet Commonly known as: LIPITOR Take 20 mg by mouth daily.   bisacodyl 10 MG suppository Commonly known as: DULCOLAX Place 1 suppository (10 mg total) rectally daily as needed for severe constipation.   Breo Ellipta 100-25 MCG/INH  Aepb Generic drug: fluticasone furoate-vilanterol Inhale 1 puff into the lungs daily.   colchicine 0.6 MG tablet Take 0.6 mg by mouth daily as needed (gout flare).   finasteride 5 MG tablet Commonly known as: PROSCAR Take 1 tablet (5 mg total) by mouth daily.   furosemide 40 MG tablet Commonly known as: LASIX Take 1 tablet (40 mg total) by mouth daily. What changed:   medication strength  how much to take  when to take this   guaiFENesin-dextromethorphan 100-10 MG/5ML syrup Commonly known as: ROBITUSSIN DM Take 10 mLs by mouth every 4 (four) hours as needed for cough.   insulin glargine 100 UNIT/ML injection Commonly known as: LANTUS Inject 0.2 mLs (20 Units total) into the skin at bedtime.   Insulin Pen Needle 31G X 8 MM Misc BD Ultra-Fine Short Pen Needle 31 gauge x 5/16"   lactulose 10 GM/15ML solution Commonly known as: CHRONULAC Take 30 mLs (20 g total) by mouth 3 (three) times daily as needed for moderate constipation.   metoprolol tartrate 25 MG tablet Commonly known as: LOPRESSOR Take 25 mg by mouth 2 (two) times daily.   midodrine 2.5 MG tablet Commonly known as: PROAMATINE Take 1 tablet (2.5 mg total) by mouth 2 (two) times daily with a meal.   ondansetron 4 MG disintegrating tablet Commonly known as: ZOFRAN-ODT Take 1 tablet by mouth every 8 (eight) hours as needed.   pantoprazole 40 MG tablet Commonly known as: PROTONIX Take 1 tablet (40 mg total) by mouth 2 (two) times daily before a meal.   polyethylene glycol 17 g packet Commonly known as: MIRALAX / GLYCOLAX Take 17 g by mouth 2 (two) times daily.   senna 8.6 MG Tabs tablet Commonly known as: SENOKOT Take 2 tablets (17.2 mg total) by mouth 2 (two) times daily.   sucralfate 1 GM/10ML suspension Commonly known as: CARAFATE Take 10 mLs (1 g total) by mouth 4 (four) times daily -  with meals and at bedtime.   tamsulosin 0.4 MG Caps capsule Commonly known as: FLOMAX Take 0.4 mg by mouth  every evening.       Contact information for follow-up providers    SpIdelle CrouchMD. Schedule an appointment as soon as possible for a visit in 1 week(s).   Specialty: Internal Medicine Why: with repeat cbc/bmp Contact information: [phone]        KoCorey SkainsMD. Schedule an appointment as soon as possible for a visit in 1 week(s).   Specialty: Cardiology Contact information: [address] [place] [phone]          Contact information for after-discharge care    [facility] .   Service: Skilled Nursing Contact information: [phone]               Allergies  Allergen Reactions  . Shrimp [Shellfish Allergy] Anaphylaxis    Consultations:  Cardiology   Procedures/Studies: Ct Abdomen Pelvis Wo Contrast  Result Date: 06/17/2019 CLINICAL DATA:  Abdominal distension. EXAM: CT ABDOMEN AND PELVIS WITHOUT CONTRAST TECHNIQUE: Multidetector CT imaging of the abdomen and pelvis was performed following the standard protocol without IV contrast. COMPARISON:  Chest x-ray May 31, 2019. CT abdomen and pelvis May 31, 2019. FINDINGS: Lower chest: There are patchy infiltrates in the bases and right middle lobe which are new in the interval, worrisome for an infectious process. Atypical infections should be considered. Lung bases are otherwise normal. Lower chest is otherwise normal. Hepatobiliary: A low-attenuation lesion in the right hepatic lobe on series 2, image 17 is stable and remains too small to characterize. Statistically, this is most likely a small benign lesion such as a cyst. No other liver abnormalities are identified. The gallbladder is normal. The portal veins not assessed due to lack of contrast. Pancreas: Unremarkable. No  pancreatic ductal dilatation or surrounding inflammatory changes. Spleen: Normal in size without focal abnormality. Adrenals/Urinary Tract: Bilateral perinephric stranding is stable and likely nonacute. The kidneys and ureters are otherwise normal. There is a Foley catheter in the bladder. The bladder is partially decompressed. There is air in the bladder is well, likely due to the Foley catheter. The fluid in the bladder demonstrates an attenuation of 49 Hounsfield units. Stomach/Bowel: Stomach is within normal limits. Appendix appears normal. No evidence of bowel wall thickening, distention, or inflammatory changes. Vascular/Lymphatic: Calcified atherosclerosis is seen in the nonaneurysmal aorta. No adenopathy. Reproductive: Prostate is unremarkable. Other: No abdominal wall hernia or abnormality. No abdominopelvic ascites. Musculoskeletal: No acute or significant osseous findings. IMPRESSION: 1. Patchy infiltrates in the lung bases and right middle lobe are new in the interval, worrisome for an infectious process. Atypical infections should be considered. 2. The bladder is partially decompressed with a Foley catheter. The fluid in the bladder demonstrates an attenuation of 49 Hounsfield units. This could represent blood products. Recommend clinical correlation. 3. Atherosclerosis in the nonaneurysmal aorta. Aortic Atherosclerosis (ICD10-I70.0). Electronically Signed   By: Dorise Bullion III M.D   On: 06/17/2019 11:22   Ct Abdomen Pelvis Wo Contrast  Result Date: 05/31/2019 CLINICAL DATA:  Weight loss, unintended, non-localized Abdominal pain, nausea vomiting and diarrhea. Shortness of breath. Fevers and chills over the last 2 days. Hypoxia EXAM: CT ABDOMEN AND PELVIS WITHOUT CONTRAST TECHNIQUE: Multidetector CT imaging of the abdomen and pelvis was performed following the standard protocol without IV contrast. COMPARISON:  CT of the abdomen and pelvis 11/21/2017 FINDINGS: Lower chest: Scarring of the  lingula and right middle lobe is stable. Minimal dependent atelectasis is present. Heart size is normal. Hepatobiliary: There is diffuse fatty infiltration of the liver. No discrete lesions are present. The common bile duct and gallbladder are normal. Pancreas: Unremarkable. No pancreatic ductal dilatation or surrounding inflammatory changes. Spleen: Normal in size without focal abnormality. Adrenals/Urinary Tract: Adrenal glands are normal bilaterally. Kidneys and ureters are within normal limits. There is no stone or mass lesion. No obstruction is present. The urinary bladder is distended. No focal abnormality is present. Stomach/Bowel: The stomach and duodenum are within normal limits. Small bowel is unremarkable. Terminal ileum is within normal limits. Appendix is visualized and normal. The ascending and transverse colon are normal. The descending and sigmoid colon are within normal limits. Vascular/Lymphatic: Atherosclerotic calcifications are present in the aorta and branch vessels 50% narrowing is present at the origin of the SMA. Moderate  stenosis is present at the origin of the IMA. Dense atherosclerotic calcifications are present in the proximal iliac arteries bilaterally. Reproductive: Prostate is unremarkable. Other: No abdominal wall hernia or abnormality. No abdominopelvic ascites. Musculoskeletal: Advanced degenerative changes are present in the lower lumbar spine. No acute abnormalities are present. Bony pelvis is within normal limits. The hips are located and within normal limits. IMPRESSION: 1. No acute or focal lesion to explain the patient's symptoms. 2. Hepatic steatosis. 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 4. Aortic Atherosclerosis (ICD10-I70.0). Electronically Signed   By: San Morelle M.D.   On: 05/31/2019 07:11   Dg Abd 1 View  Result Date: 06/08/2019 CLINICAL DATA:  Constipation EXAM: ABDOMEN - 1 VIEW COMPARISON:  CT abdomen  pelvis-05/31/2019; chest radiograph-earlier same day FINDINGS: Moderate gaseous distention of the colon without upstream distension of the small bowel. Unremarkable colonic stool burden. Nondiagnostic evaluation for pneumoperitoneum secondary to supine positioning and exclusion of the lower thorax. No pneumatosis or portal venous gas. No definitive abnormal intra-abdominal calcifications. Degenerative change the lower lumbar spine and bilateral hips is suspected though incompletely evaluated. IMPRESSION: 1. Mild gaseous distention of the colon without evidence of enteric obstruction. 2. Unremarkable colonic stool burden. Electronically Signed   By: Sandi Mariscal M.D.   On: 06/08/2019 08:33   Nm Pulmonary Perf And Vent  Result Date: 06/01/2019 CLINICAL DATA:  Hypoxemia, shortness of breath since Tuesday, COPD, asthma, question pulmonary embolism EXAM: NUCLEAR MEDICINE VENTILATION - PERFUSION LUNG SCAN TECHNIQUE: Ventilation images were obtained in multiple projections using inhaled aerosol Tc-58mDTPA. Perfusion images were obtained in multiple projections after intravenous injection of Tc-97mAA. RADIOPHARMACEUTICALS:  31.98 mCi of Tc-9946mPA aerosol inhalation and 4.12 mCi Tc99m40m IV COMPARISON:  None Correlation: Chest radiograph 05/31/2019 FINDINGS: Ventilation: Overall did irregular diminished ventilation within the upper lobes. Focal ventilatory defect lateral aspect RIGHT upper lobe. Prominent LEFT hilum. Perfusion: Matching perfusion defect lateral RIGHT upper lobe. Remaining perfusion normal. No additional segmental or subsegmental perfusion defects. Chest radiograph: Scattered interstitial prominence in the periphery of the lungs bilaterally greater in RIGHT upper lobe at site of ventilation and perfusion abnormality. IMPRESSION: Low probability for pulmonary embolism. Electronically Signed   By: MarkLavonia Dana.   On: 06/01/2019 08:21   Dg Chest Port 1 View  Result Date: 06/23/2019 CLINICAL  DATA:  Reason for exam: dyspnea Patient preferred not to take necklace off (wedding ring attached). EXAM: PORTABLE CHEST 1 VIEW COMPARISON:  Radiograph 06/19/2019, 05/31/2019 FINDINGS: Stable cardiac silhouette. There is bilateral peripheral airspace disease unchanged from comparison exam. Mild basilar atelectasis. No pleural fluid. No pneumothorax. No acute osseous abnormality. IMPRESSION: Persistent peripheral severe airspace disease. No significant interval change. Findings suggestive of viral pneumonia. Electronically Signed   By: StewSuzy Bouchard.   On: 06/23/2019 09:09   Dg Chest Port Am  Result Date: 06/19/2019 CLINICAL DATA:  Shortness of breath EXAM: PORTABLE CHEST 1 VIEW COMPARISON:  May 31, 2019. FINDINGS: There is cardiomegaly with pulmonary venous hypertension. There is diffuse interstitial prominence in the periphery of each lung, progressed from recent study. There is no frank airspace consolidation. There are small pleural effusions bilaterally. There is aortic atherosclerosis. No adenopathy. Bones appear osteoporotic. IMPRESSION: Cardiomegaly with pulmonary vascular congestion. Small pleural effusions bilaterally. Diffuse interstitial prominence in the periphery of each lung, likely interstitial edema superimposed on underlying chronic fibrosis. There is felt to likely be a degree of congestive heart  failure superimposed on fibrosis. No frank airspace consolidation. Aortic Atherosclerosis (ICD10-I70.0). Electronically Signed   By: Lowella Grip III M.D.   On: 06/19/2019 07:59   Dg Chest Portable 1 View  Result Date: 05/31/2019 CLINICAL DATA:  Shortness of breath EXAM: PORTABLE CHEST 1 VIEW COMPARISON:  January 05, 2016 FINDINGS: The heart size and mediastinal contours are within normal limits. Aortic knob calcifications are seen. There is minimally increased interstitial markings seen at the periphery. There is bibasilar subsegmental atelectasis. No large airspace consolidation or  pleural effusion. No acute osseous abnormality. IMPRESSION: Mildly increased interstitial markings which could be due to interstitial edema. Electronically Signed   By: Prudencio Pair M.D.   On: 05/31/2019 03:52   Dg Abd Portable 2v  Result Date: 06/15/2019 CLINICAL DATA:  Vomiting. EXAM: PORTABLE ABDOMEN - 2 VIEW COMPARISON:  Abdominal radiograph dated 06/08/2019 and chest x-ray dated 05/31/2019 FINDINGS: The bowel gas pattern is normal. No dilated loops of large or small bowel. No free air in the abdomen. No acute bone abnormality. The patient appears to have hazy peripheral infiltrates at the lung bases which appear to be new since 05/31/2019. IMPRESSION: 1. Benign-appearing abdomen. 2. There appear to be new peripheral infiltrates at the left and right lung bases. Electronically Signed   By: Lorriane Shire M.D.   On: 06/15/2019 09:31       Subjective: Patient seen and examined at bedside.  He denies any worsening shortness of breath.  No overnight fever or vomiting.  He feels that he is ready to go home today.  Discharge Exam: Vitals:   06/25/19 0815 06/25/19 0856  BP: (!) 131/56   Pulse: 70   Resp: 18   Temp: 98.1 F (36.7 C)   SpO2: 91% 93%    General: Pt is alert, awake, not in acute distress.  Looks chronically ill. Cardiovascular: rate controlled, S1/S2 + Respiratory: bilateral decreased breath sounds at bases with some scattered crackles Abdominal: Soft, NT, ND, bowel sounds + Extremities: Trace lower extremity edema, no cyanosis    The results of significant diagnostics from this hospitalization (including imaging, microbiology, ancillary and laboratory) are listed below for reference.     Microbiology: No results found for this or any previous visit (from the past 240 hour(s)).   Labs: BNP (last 3 results) Recent Labs    05/31/19 0616 06/19/19 0341  BNP 37.0 233.4*   Basic Metabolic Panel: Recent Labs  Lab 06/21/19 0715 06/22/19 0753 06/23/19 0812  06/24/19 0351 06/25/19 0250  NA 133* 132* 133* 133* 136  K 4.1 4.0 4.0 4.0 3.8  CL 95* 96* 98 95* 98  CO2 _0 GLUCOSE 170* 127* 131* 131* 122*  BUN 24* 21 24* 19 14  CREATININE 1.42* 1.41* 1.41* 1.53* 1.42*  CALCIUM 8.1* 8.1* 7.9* 8.0* 8.1*  MG  --   --  1.8 1.7 1.7   Liver Function Tests: Recent Labs  Lab 06/18/19 1645 06/19/19 0341 06/20/19 0001 06/22/19 0753 06/23/19 0812  AST _1 ALT _2 ALKPHOS 30* 28* 32* 32* 31*  BILITOT 1.3* 0.8 1.1 1.1 1.4*  PROT 5.2* 5.2* 5.6* 5.5* 5.3*  ALBUMIN 2.3* 2.3* 2.4* 2.2* 2.1*   No results for input(s): LIPASE, AMYLASE in the last 168 hours. No results for input(s): AMMONIA in the last 168 hours. CBC: Recent Labs  Lab 06/21/19 0715 06/22/19 0753 06/23/19 0812 06/24/19 0351 06/25/19 0250  WBC 5.9 5.8 5.1  4.9 4.2  NEUTROABS  --   --  3.9 3.2 2.3  HGB 12.5* 12.3* 11.9* 11.0* 11.0*  HCT 37.5* 36.0* 36.6* 33.0* 32.4*  MCV 94.9 93.3 95.3 93.8 93.4  PLT 101* 91* 85* 89* 90*   Cardiac Enzymes: No results for input(s): CKTOTAL, CKMB, CKMBINDEX, TROPONINI in the last 168 hours. BNP: Invalid input(s): POCBNP CBG: Recent Labs  Lab 06/24/19 0615 06/24/19 1210 06/24/19 1636 06/24/19 2047 06/25/19 0604  GLUCAP 115* 285* 280* 164* 110*   D-Dimer No results for input(s): DDIMER in the last 72 hours. Hgb A1c No results for input(s): HGBA1C in the last 72 hours. Lipid Profile No results for input(s): CHOL, HDL, LDLCALC, TRIG, CHOLHDL, LDLDIRECT in the last 72 hours. Thyroid function studies No results for input(s): TSH, T4TOTAL, T3FREE, THYROIDAB in the last 72 hours.  Invalid input(s): FREET3 Anemia work up No results for input(s): VITAMINB12, FOLATE, FERRITIN, TIBC, IRON, RETICCTPCT in the last 72 hours. Urinalysis    Component Value Date/Time   COLORURINE YELLOW (A) 01/05/2016 0408   APPEARANCEUR CLEAR (A) 01/05/2016 0408   LABSPEC 1.020 01/05/2016 0408   PHURINE 5.0 01/05/2016 0408    GLUCOSEU 50 (A) 01/05/2016 0408   HGBUR NEGATIVE 01/05/2016 0408   BILIRUBINUR NEGATIVE 01/05/2016 0408   KETONESUR NEGATIVE 01/05/2016 0408   PROTEINUR 100 (A) 01/05/2016 0408   NITRITE NEGATIVE 01/05/2016 0408   LEUKOCYTESUR NEGATIVE 01/05/2016 0408   Sepsis Labs Invalid input(s): PROCALCITONIN,  WBC,  LACTICIDVEN Microbiology No results found for this or any previous visit (from the past 240 hour(s)).   Time coordinating discharge: 35 minutes  SIGNED:   Aline August, MD  [facility] 06/25/2019, 11:09 AM

## 2019-06-25 NOTE — Progress Notes (Signed)
Progress Note  Patient Name: Jay Morris Date of Encounter: 06/25/2019  Primary Cardiologist: No primary care provider on file.   Subjective   Feeling well.  Still gets short of breath with minimal exertion.   Inpatient Medications    Scheduled Meds: . allopurinol  100 mg Oral Daily  . amiodarone  200 mg Oral BID  . apixaban  5 mg Oral BID  . atorvastatin  20 mg Oral q1800  . Chlorhexidine Gluconate Cloth  6 each Topical Daily  . finasteride  5 mg Oral Daily  . fluticasone furoate-vilanterol  1 puff Inhalation Daily  . furosemide  40 mg Oral BID AC  . insulin aspart  0-15 Units Subcutaneous TID WC  . insulin aspart  2-5 Units Subcutaneous QHS  . insulin aspart  4 Units Subcutaneous TID WC  . insulin glargine  20 Units Subcutaneous QHS  . metoprolol tartrate  25 mg Oral BID  . midodrine  2.5 mg Oral BID WC  . pantoprazole  40 mg Oral BID AC  . polyethylene glycol  17 g Oral TID  . senna  2 tablet Oral QHS  . sucralfate  1 g Oral TID WC & HS  . tamsulosin  0.4 mg Oral QHS  . umeclidinium bromide  1 puff Inhalation Daily   Continuous Infusions:  PRN Meds: acetaminophen, albuterol, alum & mag hydroxide-simeth, bisacodyl, chlorpheniramine-HYDROcodone, cyclobenzaprine, diphenhydrAMINE, guaiFENesin-dextromethorphan, lactulose, ondansetron **OR** ondansetron (ZOFRAN) IV, promethazine, sodium chloride   Vital Signs    Vitals:   06/25/19 0025 06/25/19 0434 06/25/19 0815 06/25/19 0856  BP: 126/63 132/62 (!) 131/56   Pulse: 60 62 70   Resp: _0 Temp: 97.6 F (36.4 C) 97.7 F (36.5 C) 98.1 F (36.7 C)   TempSrc: Oral Oral Oral   SpO2: 91% 93% 91% 93%  Weight:      Height:        Intake/Output Summary (Last 24 hours) at 06/25/2019 1006 Last data filed at 06/25/2019 0900 Gross per 24 hour  Intake 840 ml  Output 2626 ml  Net -1786 ml   Last 3 Weights 06/21/2019 06/02/2019 06/01/2019  Weight (lbs) 172 lb 3.2 oz 189 lb 2.5 oz 186 lb 4.6 oz  Weight (kg)  78.109 kg 85.8 kg 84.5 kg      Telemetry    Sinus rhythm.  No events  - Personally Reviewed  ECG    n/a - Personally Reviewed  Physical Exam   GEN: No acute distress.   Neck: No JVD Cardiac: RRR, no murmurs, rubs, or gallops.  Respiratory: Expiratory wheezes and rhonchi. No crackles.  GI: Soft, nontender, non-distended  MS: No edema; No deformity. Neuro:  Nonfocal  Psych: Normal affect   Labs    High Sensitivity Troponin:   Recent Labs  Lab 05/31/19 0329 05/31/19 0534 05/31/19 1039  TROPONINIHS 18* 17 14      Chemistry Recent Labs  Lab 06/20/19 0001  06/22/19 0753 06/23/19 0812 06/24/19 0351 06/25/19 0250  NA 135   < > 132* 133* 133* 136  K 4.0   < > 4.0 4.0 4.0 3.8  CL 98   < > 96* 98 95* 98  CO2 25   < > _1 GLUCOSE 95   < > 127* 131* 131* 122*  BUN 19   < > 21 24* 19 14  CREATININE 1.28*   < > 1.41* 1.41* 1.53* 1.42*  CALCIUM 8.1*   < > 8.1* 7.9* 8.0*  8.1*  PROT 5.6*  --  5.5* 5.3*  --   --   ALBUMIN 2.4*  --  2.2* 2.1*  --   --   AST 22  --  24 27  --   --   ALT 28  --  24 26  --   --   ALKPHOS 32*  --  32* 31*  --   --   BILITOT 1.1  --  1.1 1.4*  --   --   GFRNONAA 54*   < > 48* 48* 44* 48*  GFRAA >60   < > 56* 56* 51* 56*  ANIONGAP 12   < > _0 < > = values in this interval not displayed.     Hematology Recent Labs  Lab 06/23/19 0812 06/24/19 0351 06/25/19 0250  WBC 5.1 4.9 4.2  RBC 3.84* 3.52* 3.47*  HGB 11.9* 11.0* 11.0*  HCT 36.6* 33.0* 32.4*  MCV 95.3 93.8 93.4  MCH 31.0 31.3 31.7  MCHC 32.5 33.3 34.0  RDW 15.0 15.0 14.7  PLT 85* 89* 90*    BNP Recent Labs  Lab 06/19/19 0341  BNP 133.1*     DDimer No results for input(s): DDIMER in the last 168 hours.   Radiology    No results found.  Cardiac Studies   Echo 05/31/19: IMPRESSIONS    1. Left ventricular ejection fraction, by visual estimation, is 60 to 65%. The left ventricle has normal function. Left ventricular septal wall thickness was  mildly increased. Mildly increased left ventricular posterior wall thickness. There is mildly  increased left ventricular hypertrophy.  2. Global right ventricle has normal systolic function.The right ventricular size is mildly enlarged. No increase in right ventricular wall thickness.  3. Left atrial size was normal.  4. Right atrial size was mildly dilated.  5. The mitral valve was not well visualized. Trace mitral valve regurgitation.  6. The tricuspid valve is not well visualized. Tricuspid valve regurgitation is mild.  7. The aortic valve is grossly normal. Aortic valve regurgitation is trivial.  8. The pulmonic valve was not well visualized. Pulmonic valve regurgitation is trivial.  9. The aortic root was not well visualized. 10. Mildly elevated pulmonary artery systolic pressure. 11. The atrial septum is grossly normal.  Patient Profile     73 y.o. male with COPD, PAF s/p ablation, diabetes and hypertensin admitted 11/14 with COVID pneumonia.  Hospitalization complicated by afib with RVR now in sinus rhythm s/p DCCV.   Assessment & Plan    # PAF:  Maintaining sinus rhythm after DCCV.  Continue amiodarone 200 mg twice daily for 7 days followed by 200 mg daily for 21 days.  No plans to continue long-term.  Continue metoprolol and Eliquis.  # Acute on chronic diastolic heart failure:   He was net -2 L with IV Lasix yesterday.  At home he takes Lasix 20 mg p.o. daily.  He was scheduled to start 40 mg twice daily today.  However given his brisk diuresis and the fact that he appears to be euvolemic, we will plan for once daily dosing.  He has some orthostatic hypotension in the past so we will avoid overdiuresis.  Would check BMP at follow-up.   [facility] will sign off.   Medication Recommendations:  Continue amiodarone as above Other recommendations (labs, testing, etc):  BMP at follow up Follow up as an outpatient:  We will arrange  For questions or updates, please contact [facility] Please consult [email or web address] for contact info under        Signed, Skeet Latch, MD  06/25/2019, 10:06 AM

## 2019-06-25 NOTE — Progress Notes (Signed)
Physical Therapy Treatment Patient Details Name: Jay Morris MRN: [id number] DOB: 12/17/1945 Today's Date: 06/25/2019    History of Present Illness Pt is a 73 y.o. male with a known history of asthma, atrial fibrillation, COPD, type 2 diabetes mellitus, hypertension and dyslipidemia, presented to the emergency room with acute onset of 2-day history of fever with a T-max of 103 and chills with associated body aching, generalized weakness, fatigue and malaise.  He admitted to dyspnea with paroxysmal nocturnal dyspnea and dyspnea on exertion without orthopnea or lower extremity edema.  He admitted to nausea and vomiting as well as watery diarrhea.  He admitted to mild epigastric and infraumbilical abdominal pain.  He lost about 10 pounds during the last 4 days.  He had mild dizziness without headache or diplopia.  Upon presentation to the emergency room, temperature was 99.3 respiratory to 25, heart rate 82, blood pressure 150/66 and pulse oximetry 91% on room air dropped to 82% upon ambulation for short distance.  CBC showed anemia with hemoglobin of 12.5 hematocrit of 37.1 better than his previous levels and very close to his baseline.  For which x-ray showed mild increase interstitial markings which could be due to interstitial pulmonary edema.  He will be admitted to a medical monitored bed for further evaluation and management.  MD assessment includes: Acute kidney injury superimposed on chronic kidney disease stage IIIa, N&V, diarrhea, DM II with hyperglycemia, A-fib, and general weakness; he was found to have acute hypoxic respiratory failure secondary to COVID-19 pneumonia.  Hospital course was complicated by progressively worsening hypoxemia requiring 15 L of high flow oxygen and A. fib with RVR.    PT Comments    Pt scheduled to d/c today, but was anxious to ambulate.  He con't to de-sat quickly even on 4-6 L o2 via nasal canula.  Due to o2 sensor placement and holding onto RW, unable to get  consistent reading, but did see a reading into the 60's.  It quickly returned to 70s and then 80s once sitting.  Discussed home energy conservation techniques.  Recommend RW and 3-1 BSC.  Discussed use of a w/c, but pt and wife are not interested.  Con't to recommend HHPT.   Follow Up Recommendations  Home health PT     Equipment Recommendations  Rolling walker with 5" wheels;3in1 (PT)(wife and pt decline w/c)    Recommendations for Other Services       Precautions / Restrictions Precautions Precautions: Fall Precaution Comments: monitor o2    Mobility  Bed Mobility               General bed mobility comments: up in recliner upon arrival  Transfers Overall transfer level: Needs assistance Equipment used: Rolling walker (2 wheeled) Transfers: Sit to/from Stand Sit to Stand: Supervision            Ambulation/Gait Ambulation/Gait assistance: Min guard;Supervision Gait Distance (Feet): 60 Feet Assistive device: Rolling walker (2 wheeled) Gait Pattern/deviations: Step-through pattern Gait velocity: reduced   General Gait Details: Amb on 6 L/min with o2 dropping into 60's then reading stopped. BY the time returned to room and he sat and reading came back (letting go over RW), o2 sat initially read upper 60's then quickly went to 75% then low 80's. TOok 5 mins to get to upper 80's and then re-applied at 4 L/min and took several more minutes to maintain at 88-89%   Chief Strategy Officer  Modified Rankin (Stroke Patients Only)       Balance             Standing balance-Leahy Scale: Fair                              Cognition Arousal/Alertness: Awake/alert Behavior During Therapy: WFL for tasks assessed/performed                                          Exercises      General Comments        Pertinent Vitals/Pain      Home Living                      Prior Function             PT Goals (current goals can now be found in the care plan section) Acute Rehab PT Goals Time For Goal Achievement: 07/09/19 Potential to Achieve Goals: Fair Progress towards PT goals: Progressing toward goals    Frequency    Min 3X/week      PT Plan Current plan remains appropriate    Co-evaluation              AM-PAC PT "6 Clicks" Mobility   Outcome Measure  Help needed turning from your back to your side while in a flat bed without using bedrails?: A Little Help needed moving from lying on your back to sitting on the side of a flat bed without using bedrails?: A Little Help needed moving to and from a bed to a chair (including a wheelchair)?: A Little Help needed standing up from a chair using your arms (e.g., wheelchair or bedside chair)?: A Little Help needed to walk in hospital room?: A Little Help needed climbing 3-5 steps with a railing? : Total 6 Click Score: 16    End of Session         PT Visit Diagnosis: Difficulty in walking, not elsewhere classified (R26.2);Muscle weakness (generalized) (M62.81)     Time: 1583-0940 PT Time Calculation (min) (ACUTE ONLY): 20 min  Charges:  $Gait Training: 8-22 mins                     Stacye Noori L. Tamala Julian, [place] Pager [phone] 06/25/2019    Galen Manila 06/25/2019, 12:41 PM

## 2019-06-25 NOTE — Progress Notes (Signed)
Patient in a stable condition, discharge education reviewed with patient and his wife, they verbalized understanding, iv removed, tele dc,home O2 at bedside, awaiting the delivery of walker and bedside commode, patient to be transported home by his daughter.

## 2019-07-09 ENCOUNTER — Ambulatory Visit: Payer: Self-pay | Admitting: Urology

## 2019-07-09 ENCOUNTER — Ambulatory Visit: Payer: Self-pay | Admitting: Physician Assistant

## 2019-07-11 ENCOUNTER — Ambulatory Visit: Payer: Medicare HMO | Admitting: Cardiology

## 2019-07-30 ENCOUNTER — Ambulatory Visit: Payer: Self-pay | Admitting: Urology

## 2019-08-03 ENCOUNTER — Encounter: Payer: Self-pay | Admitting: Urology

## 2019-08-03 ENCOUNTER — Ambulatory Visit: Payer: Medicare HMO | Admitting: Urology

## 2019-08-03 ENCOUNTER — Ambulatory Visit: Payer: Self-pay | Admitting: Physician Assistant

## 2019-08-03 ENCOUNTER — Other Ambulatory Visit: Payer: Self-pay

## 2019-08-03 VITALS — BP 104/57 | HR 60 | Ht 68.0 in | Wt 170.0 lb

## 2019-08-03 DIAGNOSIS — N401 Enlarged prostate with lower urinary tract symptoms: Secondary | ICD-10-CM

## 2019-08-03 DIAGNOSIS — R338 Other retention of urine: Secondary | ICD-10-CM | POA: Diagnosis not present

## 2019-08-03 NOTE — Progress Notes (Signed)
08/03/2019 9:53 AM   Jay Morris 01-Jul-1946 [id number]  Referring provider: [name], MD [place] [facility] [place],  [place] [address]  Chief Complaint  Patient presents with  . Urinary Retention    HPI: Jay Morris is a 74 y.o. male who presents for evaluation of urinary retention.  He has a history of COPD, asthma who presented to [facility] on 05/[facility]/2019 with complaints of fever, chills and worsening shortness of breath.  He was found to have acute hypoxic respiratory failure secondary to Covid pneumonia and transferred to [facility].  He was subsequently transferred to [facility] secondary to difficulties with cardiac rate control.  He has a long history of BPH on tamsulosin and developed urinary retention during his hospitalization.  A catheter was placed on 06/15/2019.  His tamsulosin was increased to 0.8 mg and finasteride was added.  He had a voiding trial on 11/29 which was unsuccessful and the catheter was reinserted on 12/1.  Prior to his hospitalization he had no bothersome lower urinary tract symptoms on tamsulosin.  His tamsulosin was decreased back to 0.4 mg due to hypotension.   PMH: Past Medical History:  Diagnosis Date  . Asthma   . Atrial fibrillation ([place])   . BPH (benign prostatic hyperplasia)   . COPD (chronic obstructive pulmonary disease) ([place])   . Diabetes mellitus without complication ([place])   . Helicobacter pylori gastritis   . Hyperlipidemia   . Hypertension   . Pneumonia     Surgical History: Past Surgical History:  Procedure Laterality Date  . CARDIAC ELECTROPHYSIOLOGY MAPPING AND ABLATION    . CARDIOVERSION N/A 06/22/2019   Procedure: CARDIOVERSION;  Surgeon: Fay Records, MD;  Location: [place];  Service: Cardiovascular;  Laterality: N/A;  . COLONOSCOPY WITH PROPOFOL N/A 06/15/2017   Procedure: COLONOSCOPY WITH PROPOFOL;  Surgeon: Toledo, Benay Pike, MD;  Location: [facility] ENDOSCOPY;  Service:  Gastroenterology;  Laterality: N/A;  . ENTEROSCOPY N/A 11/24/2017   Procedure: ENTEROSCOPY;  Surgeon: Jonathon Bellows, MD;  Location: [facility] ENDOSCOPY;  Service: Gastroenterology;  Laterality: N/A;  . ESOPHAGOGASTRODUODENOSCOPY N/A 11/27/2017   Procedure: ESOPHAGOGASTRODUODENOSCOPY (EGD);  Surgeon: Lucilla Lame, MD;  Location: [facility] ENDOSCOPY;  Service: Endoscopy;  Laterality: N/A;  . ESOPHAGOGASTRODUODENOSCOPY (EGD) WITH PROPOFOL N/A 06/02/2017   Procedure: ESOPHAGOGASTRODUODENOSCOPY (EGD) WITH PROPOFOL;  Surgeon: Toledo, Benay Pike, MD;  Location: [facility] ENDOSCOPY;  Service: Gastroenterology;  Laterality: N/A;  . GIVENS CAPSULE STUDY N/A 11/25/2017   Procedure: GIVENS CAPSULE STUDY;  Surgeon: Jonathon Bellows, MD;  Location: [facility] ENDOSCOPY;  Service: Gastroenterology;  Laterality: N/A;  . HAND SURGERY      Home Medications:  Allergies as of 08/03/2019      Reactions   Shrimp [shellfish Allergy] Anaphylaxis      Medication List       Accurate as of August 03, 2019  9:53 AM. If you have any questions, ask your nurse or doctor.        acetaminophen 325 MG tablet Commonly known as: TYLENOL Take 2 tablets (650 mg total) by mouth every 6 (six) hours as needed for mild pain or headache (fever >/= 101).   albuterol 108 (90 Base) MCG/ACT inhaler Commonly known as: VENTOLIN HFA Inhale 2 puffs into the lungs every 4 (four) hours as needed for wheezing or shortness of breath.   allopurinol 100 MG tablet Commonly known as: ZYLOPRIM Take 100 mg by mouth daily.   alum & mag hydroxide-simeth 200-200-20 MG/5ML suspension Commonly known as: MAALOX/MYLANTA Take  15 mLs by mouth every 6 (six) hours as needed for indigestion or heartburn.   amiodarone 200 MG tablet Commonly known as: PACERONE 262m BID for 7days, then 2062mdaily for 21 days then stop   apixaban 5 MG Tabs tablet Commonly known as: ELIQUIS Take 1 tablet (5 mg total) by mouth 2 (two) times daily.   atorvastatin 20 MG tablet Commonly known  as: LIPITOR Take 20 mg by mouth daily.   bisacodyl 10 MG suppository Commonly known as: DULCOLAX Place 1 suppository (10 mg total) rectally daily as needed for severe constipation.   Breo Ellipta 100-25 MCG/INH Aepb Generic drug: fluticasone furoate-vilanterol Inhale 1 puff into the lungs daily.   colchicine 0.6 MG tablet Take 0.6 mg by mouth daily as needed (gout flare).   finasteride 5 MG tablet Commonly known as: PROSCAR Take 1 tablet (5 mg total) by mouth daily.   furosemide 40 MG tablet Commonly known as: LASIX Take 1 tablet (40 mg total) by mouth daily.   guaiFENesin-dextromethorphan 100-10 MG/5ML syrup Commonly known as: ROBITUSSIN DM Take 10 mLs by mouth every 4 (four) hours as needed for cough.   insulin glargine 100 UNIT/ML injection Commonly known as: LANTUS Inject 0.2 mLs (20 Units total) into the skin at bedtime.   Insulin Pen Needle 31G X 8 MM Misc BD Ultra-Fine Short Pen Needle 31 gauge x 5/16"   lactulose 10 GM/15ML solution Commonly known as: CHRONULAC Take 30 mLs (20 g total) by mouth 3 (three) times daily as needed for moderate constipation.   metoprolol tartrate 25 MG tablet Commonly known as: LOPRESSOR Take 25 mg by mouth 2 (two) times daily.   midodrine 2.5 MG tablet Commonly known as: PROAMATINE Take 1 tablet (2.5 mg total) by mouth 2 (two) times daily with a meal.   ondansetron 4 MG disintegrating tablet Commonly known as: ZOFRAN-ODT Take 1 tablet by mouth every 8 (eight) hours as needed.   pantoprazole 40 MG tablet Commonly known as: PROTONIX Take 1 tablet (40 mg total) by mouth 2 (two) times daily before a meal.   polyethylene glycol 17 g packet Commonly known as: MIRALAX / GLYCOLAX Take 17 g by mouth 2 (two) times daily.   senna 8.6 MG Tabs tablet Commonly known as: SENOKOT Take 2 tablets (17.2 mg total) by mouth 2 (two) times daily.   sucralfate 1 GM/10ML suspension Commonly known as: CARAFATE Take 10 mLs (1 g total) by mouth  4 (four) times daily -  with meals and at bedtime.   tamsulosin 0.4 MG Caps capsule Commonly known as: FLOMAX Take 0.4 mg by mouth every evening.       Allergies:  Allergies  Allergen Reactions  . Shrimp [Shellfish Allergy] Anaphylaxis    Family History: Family History  Problem Relation Age of Onset  . Heart disease Mother   . Cancer Father     Social History:  reports that he has quit smoking. His smoking use included cigarettes. He has a 30.00 pack-year smoking history. He has never used smokeless tobacco. He reports that he does not drink alcohol or use drugs.  ROS: UROLOGY Frequent Urination?: No Hard to postpone urination?: No Burning/pain with urination?: No Get up at night to urinate?: No Leakage of urine?: No Urine stream starts and stops?: No Trouble starting stream?: No Do you have to strain to urinate?: No Blood in urine?: No Urinary tract infection?: No Sexually transmitted disease?: No Injury to kidneys or bladder?: No Painful intercourse?: No Weak stream?: No Erection  problems?: No Penile pain?: No  Gastrointestinal Nausea?: No Vomiting?: No Indigestion/heartburn?: No Diarrhea?: No Constipation?: No  Constitutional Fever: No Night sweats?: No Weight loss?: No Fatigue?: No  Skin Skin rash/lesions?: No Itching?: No  Eyes Blurred vision?: No Double vision?: No  Ears/Nose/Throat Sore throat?: No Sinus problems?: No  Hematologic/Lymphatic Swollen glands?: No Easy bruising?: No  Cardiovascular Leg swelling?: No Chest pain?: No  Respiratory Cough?: No Shortness of breath?: No  Endocrine Excessive thirst?: No  Musculoskeletal Back pain?: No Joint pain?: No  Neurological Headaches?: No Dizziness?: No  Psychologic Depression?: No Anxiety?: No  Physical Exam: BP (!) 104/57   Pulse 60   Ht _0  (1.727 m)   Wt 170 lb (77.1 kg)   BMI 25.85 kg/m   Constitutional:  Alert and oriented, No acute distress. HEENT: [place]  AT, moist mucus membranes.  Trachea midline, no masses. Cardiovascular: No clubbing, cyanosis, or edema. Respiratory: Normal respiratory effort, no increased work of breathing. GI: Abdomen is soft, nontender, nondistended, no abdominal masses GU: Foley catheter draining clear urine Lymph: No cervical or inguinal lymphadenopathy. Skin: No rashes, bruises or suspicious lesions. Neurologic: Grossly intact, no focal deficits, moving all 4 extremities. Psychiatric: Normal mood and affect.   Assessment & Plan:    - Urinary retention Most likely secondary to BPH.  I offered them a voiding trial this morning with a follow-up this afternoon for PVR.  I did discuss possibility of an initial successful voiding trial with subsequent retention and if that occurred over the weekend he would need to be seen in the ED for catheter placement.  He and his wife wanted to avoid any chance of going back to the ED and would prefer to follow-up Monday morning for catheter removal with a p.m. appointment for bladder scan for PVR.  If his voiding trial is successful I will see him back in 1 month and perform DRE at that visit.    Abbie Sons, [place] [address] [place], [place] [address] [phone]

## 2019-08-06 ENCOUNTER — Ambulatory Visit: Payer: Self-pay | Admitting: Physician Assistant

## 2019-08-06 ENCOUNTER — Other Ambulatory Visit: Payer: Self-pay

## 2019-08-06 ENCOUNTER — Ambulatory Visit (INDEPENDENT_AMBULATORY_CARE_PROVIDER_SITE_OTHER): Payer: Medicare HMO | Admitting: Physician Assistant

## 2019-08-06 ENCOUNTER — Encounter: Payer: Self-pay | Admitting: Physician Assistant

## 2019-08-06 VITALS — BP 101/55 | HR 66 | Ht 68.0 in | Wt 170.0 lb

## 2019-08-06 DIAGNOSIS — R338 Other retention of urine: Secondary | ICD-10-CM | POA: Diagnosis not present

## 2019-08-06 DIAGNOSIS — N401 Enlarged prostate with lower urinary tract symptoms: Secondary | ICD-10-CM

## 2019-08-06 LAB — BLADDER SCAN AMB NON-IMAGING: Scan Result: 193

## 2019-08-06 NOTE — Progress Notes (Signed)
Catheter Removal  Patient is present today for a catheter removal.  51m of water was drained from the balloon. A 18FR foley cath was removed from the bladder no complications were noted . Patient tolerated well.  Performed by: SDebroah Loop PA-C   Follow up/ Additional notes: Push fluids and RTC this afternoon for PVR.

## 2019-08-06 NOTE — Progress Notes (Signed)
Afternoon Voiding Trial Follow-up  Patient return to clinic this afternoon for PVR.  He reports drinking approximately 48 ounces of fluid since his catheter removal this morning.  He has been able to urinate.  PVR 193 mL.  We will plan for repeat PVR in 2 days. I counseled the patient on signs and symptoms of urinary retention, including lower abdominal pain, lumbar pain, abdominal distention, and the inability to urinate.  I advised him to contact the office for assistance if he develops these symptoms during routine office hours, 8 AM to 5 PM Monday through Friday.  If outside those hours, I advised him to proceed to the emergency department. He expressed understanding.  Results for orders placed or performed in visit on 08/06/19  BLADDER SCAN AMB NON-IMAGING  Result Value Ref Range   Scan Result 193

## 2019-08-06 NOTE — Progress Notes (Signed)
08/06/2019 1:54 PM   Jay Morris Apr 09, 1946 [id number]  CC: Acute urinary retention follow-up  HPI: Jay Morris is a 74 y.o. male who presents today for follow-up of acute urinary retention.  He was previously seen by Dr. Bernardo Heater on 08/03/2019 for the same.  He elected to undergo a second voiding trial today to avoid possible hospitalization over the weekend with recurrent retention.  He initially developed urinary retention during hospitalization for COVID-19 pneumonia on 06/15/2019.  Bladder scan with >1012m; this was associated with constipation x3 days.  Foley catheter was placed at that time, finasteride was started, and tamsulosin was increased to 0.8 mg daily.  Foley was removed on 06/17/2019 and patient was unable to void thereafter.  Foley catheter was replaced on 06/18/2019 with immediate drainage of 900 mL of urine.  Patient subsequently developed orthostatic hypotension and Flomax was decreased back to 0.4 mg daily.  Patient reports he is taking tamsulosin 0.4 mg and finasteride 5 mg daily.  PMH significant for BPH on tamsulosin for many years.  PMH: Past Medical History:  Diagnosis Date  . Asthma   . Atrial fibrillation ([place]   . BPH (benign prostatic hyperplasia)   . COPD (chronic obstructive pulmonary disease) ([place]   . Diabetes mellitus without complication ([place]   . Helicobacter pylori gastritis   . Hyperlipidemia   . Hypertension   . Pneumonia     Surgical History: Past Surgical History:  Procedure Laterality Date  . CARDIAC ELECTROPHYSIOLOGY MAPPING AND ABLATION    . CARDIOVERSION N/A 06/22/2019   Procedure: CARDIOVERSION;  Surgeon: RFay Records MD;  [place]  Service: Cardiovascular;  Laterality: N/A;  . COLONOSCOPY WITH PROPOFOL N/A 06/15/2017   Procedure: COLONOSCOPY WITH PROPOFOL;  Surgeon: Toledo, TBenay Pike MD;  Location: [facility] ENDOSCOPY;  Service: Gastroenterology;  Laterality: N/A;  . ENTEROSCOPY N/A 11/24/2017   Procedure:  ENTEROSCOPY;  Surgeon: AJonathon Bellows MD;  [facility];  Service: Gastroenterology;  Laterality: N/A;  . ESOPHAGOGASTRODUODENOSCOPY N/A 11/27/2017   Procedure: ESOPHAGOGASTRODUODENOSCOPY (EGD);  Surgeon: WLucilla Lame MD;  [facility];  Service: Endoscopy;  Laterality: N/A;  . ESOPHAGOGASTRODUODENOSCOPY (EGD) WITH PROPOFOL N/A 06/02/2017   Procedure: ESOPHAGOGASTRODUODENOSCOPY (EGD) WITH PROPOFOL;  Surgeon: Toledo, TBenay Pike MD;  Location: [facility] ENDOSCOPY;  Service: Gastroenterology;  Laterality: N/A;  . GIVENS CAPSULE STUDY N/A 11/25/2017   Procedure: GIVENS CAPSULE STUDY;  Surgeon: AJonathon Bellows MD;  [facility];  Service: Gastroenterology;  Laterality: N/A;  . HAND SURGERY      Home Medications:  Allergies as of 08/06/2019      Reactions   Shrimp [shellfish Allergy] Anaphylaxis      Medication List       Accurate as of August 06, 2019  1:54 PM. If you have any questions, ask your nurse or doctor.        acetaminophen 325 MG tablet Commonly known as: TYLENOL Take 2 tablets (650 mg total) by mouth every 6 (six) hours as needed for mild pain or headache (fever >/= 101).   albuterol 108 (90 Base) MCG/ACT inhaler Commonly known as: VENTOLIN HFA Inhale 2 puffs into the lungs every 4 (four) hours as needed for wheezing or shortness of breath.   allopurinol 100 MG tablet Commonly known as: ZYLOPRIM Take 100 mg by mouth daily.   alum & mag hydroxide-simeth 200-200-20 MG/5ML suspension Commonly known as: MAALOX/MYLANTA Take 15 mLs by mouth every 6 (six) hours as needed for indigestion or heartburn.   amiodarone 200 MG tablet  Commonly known as: PACERONE 264m BID for 7days, then 2042mdaily for 21 days then stop   apixaban 5 MG Tabs tablet Commonly known as: ELIQUIS Take 1 tablet (5 mg total) by mouth 2 (two) times daily.   atorvastatin 20 MG tablet Commonly known as: LIPITOR Take 20 mg by mouth daily.   bisacodyl 10 MG  suppository Commonly known as: DULCOLAX Place 1 suppository (10 mg total) rectally daily as needed for severe constipation.   Breo Ellipta 100-25 MCG/INH Aepb Generic drug: fluticasone furoate-vilanterol Inhale 1 puff into the lungs daily.   colchicine 0.6 MG tablet Take 0.6 mg by mouth daily as needed (gout flare).   finasteride 5 MG tablet Commonly known as: PROSCAR Take 1 tablet (5 mg total) by mouth daily.   furosemide 40 MG tablet Commonly known as: LASIX Take 1 tablet (40 mg total) by mouth daily.   guaiFENesin-dextromethorphan 100-10 MG/5ML syrup Commonly known as: ROBITUSSIN DM Take 10 mLs by mouth every 4 (four) hours as needed for cough.   insulin glargine 100 UNIT/ML injection Commonly known as: LANTUS Inject 0.2 mLs (20 Units total) into the skin at bedtime.   Insulin Pen Needle 31G X 8 MM Misc BD Ultra-Fine Short Pen Needle 31 gauge x 5/16"   lactulose 10 GM/15ML solution Commonly known as: CHRONULAC Take 30 mLs (20 g total) by mouth 3 (three) times daily as needed for moderate constipation.   metoprolol tartrate 25 MG tablet Commonly known as: LOPRESSOR Take 25 mg by mouth 2 (two) times daily.   midodrine 2.5 MG tablet Commonly known as: PROAMATINE Take 1 tablet (2.5 mg total) by mouth 2 (two) times daily with a meal.   ondansetron 4 MG disintegrating tablet Commonly known as: ZOFRAN-ODT Take 1 tablet by mouth every 8 (eight) hours as needed.   pantoprazole 40 MG tablet Commonly known as: PROTONIX Take 1 tablet (40 mg total) by mouth 2 (two) times daily before a meal.   polyethylene glycol 17 g packet Commonly known as: MIRALAX / GLYCOLAX Take 17 g by mouth 2 (two) times daily.   senna 8.6 MG Tabs tablet Commonly known as: SENOKOT Take 2 tablets (17.2 mg total) by mouth 2 (two) times daily.   sucralfate 1 GM/10ML suspension Commonly known as: CARAFATE Take 10 mLs (1 g total) by mouth 4 (four) times daily -  with meals and at bedtime.    tamsulosin 0.4 MG Caps capsule Commonly known as: FLOMAX Take 0.4 mg by mouth every evening.       Allergies:  Allergies  Allergen Reactions  . Shrimp [Shellfish Allergy] Anaphylaxis    Family History: Family History  Problem Relation Age of Onset  . Heart disease Mother   . Cancer Father     Social History:   reports that he has quit smoking. His smoking use included cigarettes. He has a 30.00 pack-year smoking history. He has never used smokeless tobacco. He reports that he does not drink alcohol or use drugs.  ROS: UROLOGY Frequent Urination?: No Hard to postpone urination?: No Burning/pain with urination?: No Get up at night to urinate?: No Leakage of urine?: No Urine stream starts and stops?: No Trouble starting stream?: No Do you have to strain to urinate?: No Blood in urine?: No Urinary tract infection?: No Sexually transmitted disease?: No Injury to kidneys or bladder?: No Painful intercourse?: No Weak stream?: No Erection problems?: No Penile pain?: No  Gastrointestinal Nausea?: No Vomiting?: No Indigestion/heartburn?: No Diarrhea?: No Constipation?: No  Constitutional  Fever: No Night sweats?: No Weight loss?: No Fatigue?: No  Skin Skin rash/lesions?: No Itching?: No  Eyes Blurred vision?: No Double vision?: No  Ears/Nose/Throat Sore throat?: No Sinus problems?: No  Hematologic/Lymphatic Swollen glands?: No Easy bruising?: No  Cardiovascular Leg swelling?: No Chest pain?: No  Respiratory Cough?: No Shortness of breath?: No  Endocrine Excessive thirst?: No  Musculoskeletal Back pain?: No Joint pain?: No  Neurological Headaches?: No Dizziness?: No  Psychologic Depression?: No Anxiety?: No  Physical Exam: BP (!) 101/55   Pulse 66   Ht _0  (1.727 m)   Wt 170 lb (77.1 kg)   BMI 25.85 kg/m   Constitutional:  Alert and oriented, no acute distress, nontoxic appearing HEENT: [place], AT Cardiovascular: No clubbing,  cyanosis, or edema Respiratory: On oxygen with nasal cannula in place, increased work of breathing GI: Abdomen is soft, nontender, and distended Skin: No rashes, bruises or suspicious lesions Neurologic: Grossly intact, no focal deficits, moving all 4 extremities Psychiatric: Normal mood and affect  Laboratory Data: Results for orders placed or performed in visit on 08/06/19  BLADDER SCAN AMB NON-IMAGING  Result Value Ref Range   Scan Result 193    Assessment & Plan:   1. Benign prostatic hyperplasia with urinary retention 74 year old male with recent history of acute urinary retention developed during hospitalization with COVID-19 pneumonia here for second voiding trial today.  See separate procedure notes for more details.  PVR mildly elevated on afternoon follow-up, however he has been able to urinate and consumed a significant amount of fluids in the interim.  I suspect a chronic, stably elevated PVR in this patient with a history of BPH.  Offered patient CIC teaching versus repeat PVR in 2 days.  He elected for repeat PVR in 2 days.  I am amenable with this plan.   - BLADDER SCAN AMB NON-IMAGING   Return in about 2 days (around 08/08/2019) for Repeat PVR.  Debroah Loop, PA-C  [facility] [address] [place], [place] [address] [phone]

## 2019-08-07 NOTE — Progress Notes (Signed)
08/08/2019 3:09 PM   [name] May 24, 1946 [id number]  CC: Acute urinary retention follow-up  HPI: Jay Morris is a 74 y.o. male who presents today for follow-up of acute urinary retention.  He was previously seen by Dr. Bernardo Heater on 08/03/2019 for the same.  He elected to undergo a second voiding trial today to avoid possible hospitalization over the weekend with recurrent retention.  He initially developed urinary retention during hospitalization for COVID-19 pneumonia on 06/15/2019.  Bladder scan with >1063m; this was associated with constipation x3 days.  Foley catheter was placed at that time, finasteride was started, and tamsulosin was increased to 0.8 mg daily.  Foley was removed on 06/17/2019 and patient was unable to void thereafter.  Foley catheter was replaced on 06/18/2019 with immediate drainage of 900 mL of urine.  Patient subsequently developed orthostatic hypotension and Flomax was decreased back to 0.4 mg daily.  Patient reports he is taking tamsulosin 0.4 mg and finasteride 5 mg daily.  PMH significant for BPH on tamsulosin for many years.  His PVR today is 183 mL.  He states he feels he is voiding well with minimal straining.  He feels he is emptying his bladder completely.  Patient denies any modifying or aggravating factors.  Patient denies any gross hematuria, dysuria or suprapubic/flank pain.  Patient denies any fevers, chills, nausea or vomiting.   PMH: Past Medical History:  Diagnosis Date  . Asthma   . Atrial fibrillation ([place]   . BPH (benign prostatic hyperplasia)   . COPD (chronic obstructive pulmonary disease) ([place]   . Diabetes mellitus without complication ([place]   . Helicobacter pylori gastritis   . Hyperlipidemia   . Hypertension   . Pneumonia     Surgical History: Past Surgical History:  Procedure Laterality Date  . CARDIAC ELECTROPHYSIOLOGY MAPPING AND ABLATION    . CARDIOVERSION N/A 06/22/2019   Procedure: CARDIOVERSION;  Surgeon: RFay Records MD;  [place]  Service: Cardiovascular;  Laterality: N/A;  . COLONOSCOPY WITH PROPOFOL N/A 06/15/2017   Procedure: COLONOSCOPY WITH PROPOFOL;  Surgeon: Toledo, TBenay Pike MD;  Location: [facility];  Service: Gastroenterology;  Laterality: N/A;  . ENTEROSCOPY N/A 11/24/2017   Procedure: ENTEROSCOPY;  Surgeon: AJonathon Bellows MD;  [facility];  Service: Gastroenterology;  Laterality: N/A;  . ESOPHAGOGASTRODUODENOSCOPY N/A 11/27/2017   Procedure: ESOPHAGOGASTRODUODENOSCOPY (EGD);  Surgeon: WLucilla Lame MD;  [facility];  Service: Endoscopy;  Laterality: N/A;  . ESOPHAGOGASTRODUODENOSCOPY (EGD) WITH PROPOFOL N/A 06/02/2017   Procedure: ESOPHAGOGASTRODUODENOSCOPY (EGD) WITH PROPOFOL;  Surgeon: Toledo, TBenay Pike MD;  Location: [facility];  Service: Gastroenterology;  Laterality: N/A;  . GIVENS CAPSULE STUDY N/A 11/25/2017   Procedure: GIVENS CAPSULE STUDY;  Surgeon: AJonathon Bellows MD;  [facility];  Service: Gastroenterology;  Laterality: N/A;  . HAND SURGERY      Home Medications:  Allergies as of 08/08/2019      Reactions   Shrimp [shellfish Allergy] Anaphylaxis      Medication List       Accurate as of August 08, 2019  3:09 PM. If you have any questions, ask your nurse or doctor.        acetaminophen 325 MG tablet Commonly known as: TYLENOL Take 2 tablets (650 mg total) by mouth every 6 (six) hours as needed for mild pain or headache (fever >/= 101).   albuterol 108 (90 Base) MCG/ACT inhaler Commonly known as: VENTOLIN HFA Inhale 2 puffs into the lungs every 4 (four) hours as needed  for wheezing or shortness of breath.   allopurinol 100 MG tablet Commonly known as: ZYLOPRIM Take 100 mg by mouth daily.   alum & mag hydroxide-simeth 200-200-20 MG/5ML suspension Commonly known as: MAALOX/MYLANTA Take 15 mLs by mouth every 6 (six) hours as needed for indigestion or heartburn.   amiodarone 200 MG tablet Commonly known  as: PACERONE 262m BID for 7days, then 205mdaily for 21 days then stop   apixaban 5 MG Tabs tablet Commonly known as: ELIQUIS Take 1 tablet (5 mg total) by mouth 2 (two) times daily.   atorvastatin 20 MG tablet Commonly known as: LIPITOR Take 20 mg by mouth daily.   bisacodyl 10 MG suppository Commonly known as: DULCOLAX Place 1 suppository (10 mg total) rectally daily as needed for severe constipation.   Breo Ellipta 100-25 MCG/INH Aepb Generic drug: fluticasone furoate-vilanterol Inhale 1 puff into the lungs daily.   colchicine 0.6 MG tablet Take 0.6 mg by mouth daily as needed (gout flare).   finasteride 5 MG tablet Commonly known as: PROSCAR Take 1 tablet (5 mg total) by mouth daily.   furosemide 40 MG tablet Commonly known as: LASIX Take 1 tablet (40 mg total) by mouth daily.   guaiFENesin-dextromethorphan 100-10 MG/5ML syrup Commonly known as: ROBITUSSIN DM Take 10 mLs by mouth every 4 (four) hours as needed for cough.   insulin glargine 100 UNIT/ML injection Commonly known as: LANTUS Inject 0.2 mLs (20 Units total) into the skin at bedtime.   Insulin Pen Needle 31G X 8 MM Misc BD Ultra-Fine Short Pen Needle 31 gauge x 5/16"   lactulose 10 GM/15ML solution Commonly known as: CHRONULAC Take 30 mLs (20 g total) by mouth 3 (three) times daily as needed for moderate constipation.   metoprolol tartrate 25 MG tablet Commonly known as: LOPRESSOR Take 25 mg by mouth 2 (two) times daily.   midodrine 2.5 MG tablet Commonly known as: PROAMATINE Take 1 tablet (2.5 mg total) by mouth 2 (two) times daily with a meal.   ondansetron 4 MG disintegrating tablet Commonly known as: ZOFRAN-ODT Take 1 tablet by mouth every 8 (eight) hours as needed.   pantoprazole 40 MG tablet Commonly known as: PROTONIX Take 1 tablet (40 mg total) by mouth 2 (two) times daily before a meal.   polyethylene glycol 17 g packet Commonly known as: MIRALAX / GLYCOLAX Take 17 g by mouth 2  (two) times daily.   senna 8.6 MG Tabs tablet Commonly known as: SENOKOT Take 2 tablets (17.2 mg total) by mouth 2 (two) times daily.   sucralfate 1 GM/10ML suspension Commonly known as: CARAFATE Take 10 mLs (1 g total) by mouth 4 (four) times daily -  with meals and at bedtime.   tamsulosin 0.4 MG Caps capsule Commonly known as: FLOMAX Take 0.4 mg by mouth every evening.       Allergies:  Allergies  Allergen Reactions  . Shrimp [Shellfish Allergy] Anaphylaxis    Family History: Family History  Problem Relation Age of Onset  . Heart disease Mother   . Cancer Father     Social History:   reports that he has quit smoking. His smoking use included cigarettes. He has a 30.00 pack-year smoking history. He has never used smokeless tobacco. He reports that he does not drink alcohol or use drugs.  ROS: UROLOGY Frequent Urination?: No Hard to postpone urination?: No Burning/pain with urination?: No Get up at night to urinate?: No Leakage of urine?: No Urine stream starts and stops?: No  Trouble starting stream?: No Do you have to strain to urinate?: No Blood in urine?: No Urinary tract infection?: No Sexually transmitted disease?: No Injury to kidneys or bladder?: No Painful intercourse?: No Weak stream?: No Erection problems?: No Penile pain?: No  Gastrointestinal Nausea?: No Vomiting?: No Indigestion/heartburn?: No Diarrhea?: No Constipation?: No  Constitutional Fever: No Night sweats?: No Weight loss?: No Fatigue?: No  Skin Skin rash/lesions?: No Itching?: No  Eyes Blurred vision?: No Double vision?: No  Ears/Nose/Throat Sore throat?: No Sinus problems?: No  Hematologic/Lymphatic Swollen glands?: No Easy bruising?: No  Cardiovascular Leg swelling?: No Chest pain?: No  Respiratory Cough?: No Shortness of breath?: No  Endocrine Excessive thirst?: No  Musculoskeletal Back pain?: Yes Joint pain?: No  Neurological Headaches?:  No Dizziness?: No  Psychologic Depression?: No Anxiety?: No  Physical Exam: BP (!) 108/54   Pulse 71   Ht 5' 8" (1.727 m)   BMI 25.85 kg/m   Constitutional:  Alert and oriented, no acute distress, nontoxic appearing HEENT: [place], AT Cardiovascular: No clubbing, cyanosis, or edema Respiratory: On oxygen with nasal cannula in place, increased work of breathing GI: Abdomen is soft, nontender, and distended Skin: No rashes, bruises or suspicious lesions Neurologic: Grossly intact, no focal deficits, moving all 4 extremities Psychiatric: Normal mood and affect  Laboratory Data: Results for orders placed or performed in visit on 08/08/19  Bladder Scan (Post Void Residual) in office  Result Value Ref Range   Scan Result 183    Assessment & Plan:    1. Benign prostatic hyperplasia with urinary retention - BLADDER SCAN AMB NON-IMAGING - moderate residual - Offered to patient and wife to instruct on CIC, patient defers again - Continue the finasteride 5 mg daily and tamsulosin 0.4 mg daily - RTC in one month or sooner if symptoms worsen for I PSS and PVR    Return in about 1 month (around 09/08/2019) for IPSS and PVR.  Zara Council, PA-C  [facility] [address], [place], [place] [address] [phone]

## 2019-08-08 ENCOUNTER — Other Ambulatory Visit: Payer: Self-pay

## 2019-08-08 ENCOUNTER — Encounter: Payer: Self-pay | Admitting: Urology

## 2019-08-08 ENCOUNTER — Ambulatory Visit (INDEPENDENT_AMBULATORY_CARE_PROVIDER_SITE_OTHER): Payer: Medicare HMO | Admitting: Urology

## 2019-08-08 VITALS — BP 108/54 | HR 71 | Ht 68.0 in

## 2019-08-08 DIAGNOSIS — R338 Other retention of urine: Secondary | ICD-10-CM

## 2019-08-08 DIAGNOSIS — N401 Enlarged prostate with lower urinary tract symptoms: Secondary | ICD-10-CM | POA: Diagnosis not present

## 2019-08-08 LAB — BLADDER SCAN AMB NON-IMAGING: Scan Result: 183

## 2019-08-16 DIAGNOSIS — I5033 Acute on chronic diastolic (congestive) heart failure: Secondary | ICD-10-CM | POA: Insufficient documentation

## 2019-09-01 DIAGNOSIS — D649 Anemia, unspecified: Secondary | ICD-10-CM | POA: Insufficient documentation

## 2019-09-01 NOTE — Progress Notes (Signed)
[place]  [phone]) [phone] [phone]) [phone]  ID: [name] OB: 12/14/1945  MR#: [id number]  PJA#:[id number]  Patient Care Team: [name], MD as PCP - General (Internal Medicine)  CHIEF COMPLAINT: Anemia, unspecified.  INTERVAL HISTORY: Patient is a 74 year old male who was noted to have a declining hemoglobin on routine blood work.  He has chronic weakness and fatigue, but otherwise feels well.  He has no neurologic complaints.  He denies any recent fevers or illnesses.  He has a good appetite and denies weight loss.  He has no chest pain, shortness of breath, cough, or hemoptysis.  He denies any nausea, vomiting, constipation, or diarrhea.  He has no melena or hematochezia.  He has no urinary complaints.  Patient offers no further specific complaints today.  REVIEW OF SYSTEMS:   Review of Systems  Constitutional: Positive for malaise/fatigue. Negative for fever and weight loss.  Respiratory: Negative.  Negative for cough, hemoptysis and shortness of breath.   Cardiovascular: Negative.  Negative for chest pain and leg swelling.  Gastrointestinal: Negative.  Negative for abdominal pain, blood in stool and melena.  Genitourinary: Negative.  Negative for hematuria.  Musculoskeletal: Negative.  Negative for back pain.  Skin: Negative.  Negative for rash.  Neurological: Positive for weakness. Negative for dizziness, focal weakness and headaches.  Psychiatric/Behavioral: Negative.  The patient is not nervous/anxious.     As per HPI. Otherwise, a complete review of systems is negative.  PAST MEDICAL HISTORY: Past Medical History:  Diagnosis Date  . Asthma   . Atrial fibrillation ([place])   . BPH (benign prostatic hyperplasia)   . COPD (chronic obstructive pulmonary disease) ([place])   . Diabetes mellitus without complication ([place])   . Helicobacter pylori gastritis   . Hyperlipidemia   . Hypertension   . Pneumonia     PAST SURGICAL  HISTORY: Past Surgical History:  Procedure Laterality Date  . CARDIAC ELECTROPHYSIOLOGY MAPPING AND ABLATION    . CARDIOVERSION N/A 06/22/2019   Procedure: CARDIOVERSION;  Surgeon: Fay Records, MD;  Location: [place];  Service: Cardiovascular;  Laterality: N/A;  . COLONOSCOPY WITH PROPOFOL N/A 06/15/2017   Procedure: COLONOSCOPY WITH PROPOFOL;  Surgeon: Toledo, Benay Pike, MD;  Location: [facility] ENDOSCOPY;  Service: Gastroenterology;  Laterality: N/A;  . ENTEROSCOPY N/A 11/24/2017   Procedure: ENTEROSCOPY;  Surgeon: Jonathon Bellows, MD;  Location[facility]COPY;  Service: Gastroenterology;  Laterality: N/A;  . ESOPHAGOGASTRODUODENOSCOPY N/A 11/27/2017   Procedure: ESOPHAGOGASTRODUODENOSCOPY (EGD);  Surgeon: Lucilla Lame, MD;  Location: [facility] ENDOSCOPY;  Service: Endoscopy;  Laterality: N/A;  . ESOPHAGOGASTRODUODENOSCOPY (EGD) WITH PROPOFOL N/A 06/02/2017   Procedure: ESOPHAGOGASTRODUODENOSCOPY (EGD) WITH PROPOFOL;  Surgeon: Toledo, Benay Pike, MD;  Location: [facility] ENDOSCOPY;  Service: Gastroenterology;  Laterality: N/A;  . GIVENS CAPSULE STUDY N/A 11/25/2017   Procedure: GIVENS CAPSULE STUDY;  Surgeon: Jonathon Bellows, MD;  Location: [facility] ENDOSCOPY;  Service: Gastroenterology;  Laterality: N/A;  . HAND SURGERY      FAMILY HISTORY: Family History  Problem Relation Age of Onset  . Heart disease Mother   . Cancer Father   . Cancer Paternal Grandfather     ADVANCED DIRECTIVES (Y/N):  N  HEALTH MAINTENANCE: Social History   Tobacco Use  . Smoking status: Former Smoker    Packs/day: 1.00    Years: 30.00    Pack years: 30.00    Types: Cigarettes    Quit date: 1995    Years since quitting: 26.1  . Smokeless tobacco: Never Used  Substance Use  Topics  . Alcohol use: No    Alcohol/week: 0.0 standard drinks    Comment: occasional  . Drug use: No     Colonoscopy:  PAP:  Bone density:  Lipid panel:  Allergies  Allergen Reactions  . Shrimp [Shellfish Allergy] Anaphylaxis    Current  Outpatient Medications  Medication Sig Dispense Refill  . apixaban (ELIQUIS) 5 MG TABS tablet Take 1 tablet (5 mg total) by mouth 2 (two) times daily. 60 tablet 0  . ascorbic acid (VITAMIN C) 500 MG tablet Take 500 mg by mouth daily.    Marland Kitchen atorvastatin (LIPITOR) 20 MG tablet Take 20 mg by mouth daily.    Marland Kitchen BREO ELLIPTA 100-25 MCG/INH AEPB Inhale 1 puff into the lungs daily.    . finasteride (PROSCAR) 5 MG tablet Take 1 tablet (5 mg total) by mouth daily. 30 tablet 0  . furosemide (LASIX) 40 MG tablet Take 1 tablet (40 mg total) by mouth daily. 30 tablet 0  . Insulin Degludec (TRESIBA [place]) Inject into the skin.    Marland Kitchen lisinopril (ZESTRIL) 20 MG tablet Take 10 mg by mouth daily.    . metoprolol tartrate (LOPRESSOR) 25 MG tablet Take 25 mg by mouth 2 (two) times daily.    . pantoprazole (PROTONIX) 40 MG tablet Take 1 tablet (40 mg total) by mouth 2 (two) times daily before a meal. 60 tablet 0  . senna (SENOKOT) 8.6 MG TABS tablet Take 2 tablets (17.2 mg total) by mouth 2 (two) times daily. 60 tablet 0  . tamsulosin (FLOMAX) 0.4 MG CAPS capsule Take 0.4 mg by mouth every evening.     . vitamin B-12 (CYANOCOBALAMIN) 1000 MCG tablet Take 1,000 mcg by mouth daily.    Marland Kitchen VITAMIN D, CHOLECALCIFEROL, PO Take 5,000 Units by mouth daily.    Marland Kitchen acetaminophen (TYLENOL) 325 MG tablet Take 2 tablets (650 mg total) by mouth every 6 (six) hours as needed for mild pain or headache (fever >/= 101). (Patient not taking: Reported on 09/03/2019)    . albuterol (VENTOLIN HFA) 108 (90 Base) MCG/ACT inhaler Inhale 2 puffs into the lungs every 4 (four) hours as needed for wheezing or shortness of breath. (Patient not taking: Reported on 09/03/2019) 6.7 g 0  . allopurinol (ZYLOPRIM) 100 MG tablet Take 100 mg by mouth daily.    Marland Kitchen alum & mag hydroxide-simeth (MAALOX/MYLANTA) 200-200-20 MG/5ML suspension Take 15 mLs by mouth every 6 (six) hours as needed for indigestion or heartburn. (Patient not taking: Reported on 09/03/2019) 355 mL 0   . amiodarone (PACERONE) 200 MG tablet 290m BID for 7days, then 2096mdaily for 21 days then stop (Patient not taking: Reported on 09/03/2019) 35 tablet 0  . bisacodyl (DULCOLAX) 10 MG suppository Place 1 suppository (10 mg total) rectally daily as needed for severe constipation. (Patient not taking: Reported on 09/03/2019) 12 suppository 0  . colchicine 0.6 MG tablet Take 0.6 mg by mouth daily as needed (gout flare).    . Marland KitchenuaiFENesin-dextromethorphan (ROBITUSSIN DM) 100-10 MG/5ML syrup Take 10 mLs by mouth every 4 (four) hours as needed for cough. (Patient not taking: Reported on 09/03/2019) 236 mL 0  . insulin aspart (NOVOLOG) 100 UNIT/ML FlexPen Inject  three times daily before meals as directed. Max daily dose is 50 units.    . insulin glargine (LANTUS) 100 UNIT/ML injection Inject 0.2 mLs (20 Units total) into the skin at bedtime. (Patient not taking: Reported on 09/03/2019) 10 mL 0  . Insulin Pen Needle 31G X 8  MM MISC BD Ultra-Fine Short Pen Needle 31 gauge x 5/16"    . lactulose (CHRONULAC) 10 GM/15ML solution Take 30 mLs (20 g total) by mouth 3 (three) times daily as needed for moderate constipation. (Patient not taking: Reported on 09/03/2019) 473 mL 0  . metFORMIN (GLUCOPHAGE) 1000 MG tablet Take 1,000 mg by mouth 2 (two) times daily.    . midodrine (PROAMATINE) 2.5 MG tablet Take 1 tablet (2.5 mg total) by mouth 2 (two) times daily with a meal. (Patient not taking: Reported on 09/03/2019) 30 tablet 0  . ondansetron (ZOFRAN-ODT) 4 MG disintegrating tablet Take 1 tablet by mouth every 8 (eight) hours as needed.    . polyethylene glycol (MIRALAX / GLYCOLAX) 17 g packet Take 17 g by mouth 2 (two) times daily. (Patient not taking: Reported on 09/03/2019) 30 each 0  . sucralfate (CARAFATE) 1 GM/10ML suspension Take 10 mLs (1 g total) by mouth 4 (four) times daily -  with meals and at bedtime. (Patient not taking: Reported on 09/03/2019) 420 mL 0   No current facility-administered medications for this  visit.    OBJECTIVE: Vitals:   09/03/19 1348  BP: 113/61  Pulse: 75  Resp: 20  Temp: (!) 97 F (36.1 C)  SpO2: (!) 88%     Body mass index is 27.52 kg/m.    ECOG FS:1 - Symptomatic but completely ambulatory  General: Well-developed, well-nourished, no acute distress. Eyes: Pink conjunctiva, anicteric sclera. HEENT: Normocephalic, moist mucous membranes. Lungs: No audible wheezing or coughing. Heart: Regular rate and rhythm. Abdomen: Soft, nontender, no obvious distention. Musculoskeletal: No edema, cyanosis, or clubbing. Neuro: Alert, answering all questions appropriately. Cranial nerves grossly intact. Skin: No rashes or petechiae noted. Psych: Normal affect. Lymphatics: No cervical, calvicular, axillary or inguinal LAD.   LAB RESULTS:  Lab Results  Component Value Date   NA 136 06/25/2019   K 3.8 06/25/2019   CL 98 06/25/2019   CO2 28 06/25/2019   GLUCOSE 122 (H) 06/25/2019   BUN 14 06/25/2019   CREATININE 1.42 (H) 06/25/2019   CALCIUM 8.1 (L) 06/25/2019   PROT 5.3 (L) 06/23/2019   ALBUMIN 2.1 (L) 06/23/2019   AST 27 06/23/2019   ALT 26 06/23/2019   ALKPHOS 31 (L) 06/23/2019   BILITOT 1.4 (H) 06/23/2019   GFRNONAA 48 (L) 06/25/2019   GFRAA 56 (L) 06/25/2019    Lab Results  Component Value Date   WBC 10.0 09/03/2019   NEUTROABS 2.3 06/25/2019   HGB 9.0 (L) 09/03/2019   HCT 28.9 (L) 09/03/2019   MCV 98.0 09/03/2019   PLT 275 09/03/2019     STUDIES: No results found.  ASSESSMENT: Anemia, unspecified.  PLAN:   1. Anemia, unspecified: Patient's hemoglobin has decreased to 9.0.  He recently had iron stores drawn on August 29, 2019 that revealed a mildly decreased total iron, but ferritin and percent saturation were within normal limits.  His reticulocyte count is inappropriately normal.  B12 and folate are within normal limits.  He has no evidence of hemolysis.  SPEP, erythropoietin levels, and IntelliGEN myeloid panel are pending at time of  dictation.  His anemia may be secondary to his longstanding renal insufficiency and he may benefit from Retacrit in the future.  Return to clinic in 2 weeks to discuss his results and initiation of treatment if his hemoglobin remains below 10.0.  I spent a total of 45 minutes reviewing chart data, face-to-face evaluation with the patient, counseling and coordination of care as detailed above.  Patient expressed understanding and was in agreement with this plan. He also understands that He can call clinic at any time with any questions, concerns, or complaints.    Lloyd Huger, MD   09/04/2019 9:02 AM

## 2019-09-03 ENCOUNTER — Inpatient Hospital Stay: Payer: Medicare HMO

## 2019-09-03 ENCOUNTER — Other Ambulatory Visit: Payer: Self-pay

## 2019-09-03 ENCOUNTER — Inpatient Hospital Stay: Payer: Medicare HMO | Attending: Oncology | Admitting: Oncology

## 2019-09-03 ENCOUNTER — Encounter: Payer: Self-pay | Admitting: Oncology

## 2019-09-03 DIAGNOSIS — D649 Anemia, unspecified: Secondary | ICD-10-CM | POA: Diagnosis not present

## 2019-09-03 DIAGNOSIS — Z87891 Personal history of nicotine dependence: Secondary | ICD-10-CM | POA: Diagnosis not present

## 2019-09-03 DIAGNOSIS — N289 Disorder of kidney and ureter, unspecified: Secondary | ICD-10-CM | POA: Diagnosis not present

## 2019-09-03 DIAGNOSIS — D472 Monoclonal gammopathy: Secondary | ICD-10-CM

## 2019-09-03 DIAGNOSIS — Z809 Family history of malignant neoplasm, unspecified: Secondary | ICD-10-CM | POA: Diagnosis not present

## 2019-09-03 HISTORY — DX: Monoclonal gammopathy: D47.2

## 2019-09-03 LAB — FOLATE: Folate: 15.9 ng/mL (ref >5.9)

## 2019-09-03 LAB — CBC
HCT: 28.9 % — ABNORMAL LOW (ref 39.0–52.0)
Hemoglobin: 9 g/dL — ABNORMAL LOW (ref 13.0–17.0)
MCH: 30.5 pg (ref 26.0–34.0)
MCHC: 31.1 g/dL (ref 30.0–36.0)
MCV: 98 fL (ref 80.0–100.0)
Platelets: 275 10*3/uL (ref 150–400)
RBC: 2.95 MIL/uL — ABNORMAL LOW (ref 4.22–5.81)
RDW: 18 % — ABNORMAL HIGH (ref 11.5–15.5)
WBC: 10 10*3/uL (ref 4.0–10.5)
nRBC: 0.2 % (ref 0.0–0.2)

## 2019-09-03 LAB — DAT, POLYSPECIFIC AHG (ARMC ONLY): Polyspecific AHG test: NEGATIVE

## 2019-09-03 LAB — RETICULOCYTES
Immature Retic Fract: 28 % — ABNORMAL HIGH (ref 2.3–15.9)
RBC.: 2.97 MIL/uL — ABNORMAL LOW (ref 4.22–5.81)
Retic Count, Absolute: 80.2 10*3/uL (ref 19.0–186.0)
Retic Ct Pct: 2.7 % (ref 0.4–3.1)

## 2019-09-03 LAB — VITAMIN B12: Vitamin B-12: 847 pg/mL (ref 180–914)

## 2019-09-03 LAB — LACTATE DEHYDROGENASE: LDH: 227 U/L — ABNORMAL HIGH (ref 98–192)

## 2019-09-04 LAB — PROTEIN ELECTROPHORESIS, SERUM
A/G Ratio: 1.3 (ref 0.7–1.7)
Albumin ELP: 3.9 g/dL (ref 2.9–4.4)
Alpha-1-Globulin: 0.2 g/dL (ref 0.0–0.4)
Alpha-2-Globulin: 1 g/dL (ref 0.4–1.0)
Beta Globulin: 1 g/dL (ref 0.7–1.3)
Gamma Globulin: 0.8 g/dL (ref 0.4–1.8)
Globulin, Total: 3 g/dL (ref 2.2–3.9)
M-Spike, %: 0.3 g/dL — ABNORMAL HIGH
Total Protein ELP: 6.9 g/dL (ref 6.0–8.5)

## 2019-09-04 LAB — ERYTHROPOIETIN: Erythropoietin: 24.7 m[IU]/mL — ABNORMAL HIGH (ref 2.6–18.5)

## 2019-09-04 LAB — HAPTOGLOBIN: Haptoglobin: 381 mg/dL — ABNORMAL HIGH (ref 34–355)

## 2019-09-11 ENCOUNTER — Other Ambulatory Visit: Payer: Self-pay

## 2019-09-11 ENCOUNTER — Ambulatory Visit (INDEPENDENT_AMBULATORY_CARE_PROVIDER_SITE_OTHER): Payer: Medicare HMO | Admitting: Urology

## 2019-09-11 ENCOUNTER — Encounter: Payer: Self-pay | Admitting: Urology

## 2019-09-11 VITALS — BP 110/66 | HR 86 | Ht 68.0 in | Wt 181.0 lb

## 2019-09-11 DIAGNOSIS — N401 Enlarged prostate with lower urinary tract symptoms: Secondary | ICD-10-CM

## 2019-09-11 DIAGNOSIS — R338 Other retention of urine: Secondary | ICD-10-CM | POA: Diagnosis not present

## 2019-09-11 LAB — BLADDER SCAN AMB NON-IMAGING: Scan Result: 668

## 2019-09-11 NOTE — Progress Notes (Signed)
09/11/2019 9:06 PM   Jay Morris Jun 15, 1946 [id number]  CC: Acute urinary retention follow-up  HPI: Jay Morris is a 74 y.o. male who presents today for follow-up of acute urinary retention.    He was previously seen by Dr. Bernardo Heater on 08/03/2019 for the same.    He initially developed urinary retention during hospitalization for COVID-19 pneumonia on 06/15/2019.  Bladder scan with >1039m; this was associated with constipation x3 days.  Foley catheter was placed at that time, finasteride was started, and tamsulosin was increased to 0.8 mg daily.  Foley was removed on 06/17/2019 and patient was unable to void thereafter.  Foley catheter was replaced on 06/18/2019 with immediate drainage of 900 mL of urine.  Patient subsequently developed orthostatic hypotension and Flomax was decreased back to 0.4 mg daily.  Patient reports he is taking tamsulosin 0.4 mg and finasteride 5 mg daily.  PMH significant for BPH on tamsulosin for many years.  At his visit on 08/08/2019, His PVR today was 183 mL.  He stated he was voiding well with minimal straining.  He felt he was emptying his bladder completely.    Today, he is experiencing feelings of incomplete emptying, intermittency, a weak urinary stream and straining to urinate.  PVR was 668 mL.  Patient denies any modifying or aggravating factors.  Patient denies any gross hematuria, dysuria or suprapubic/flank pain.  Patient denies any fevers, chills, nausea or vomiting.   PMH: Past Medical History:  Diagnosis Date  . Asthma   . Atrial fibrillation ([place]   . BPH (benign prostatic hyperplasia)   . COPD ([place]ronic obstructive [place] disease) ([place]   . [place]   . Helicobacter pylori gastritis   . Hyperlipidemia   . Hypertension   . Pneumonia     Surgical History: Past Surgical History:  Procedure Laterality Date  . CARDIAC ELECTROPHYSIOLOGY MAPPING AND ABLATION    . CARDIOVERSION N/A 06/22/2019   Procedure: CARDIOVERSION;  Surgeon: RFay Records MD;  [place]  Service: Cardiovascular;  Laterality: N/A;  . COLONOSCOPY WITH PROPOFOL N/A 06/15/2017   Procedure: COLONOSCOPY WITH PROPOFOL;  Surgeon: Toledo, TBenay Pike MD;  Location: [facility] ENDOSCOPY;  Service: Gastroenterology;  Laterality: N/A;  . ENTEROSCOPY N/A 11/24/2017   Procedure: ENTEROSCOPY;  Surgeon: AJonathon Bellows MD;  [facility];  Service: Gastroenterology;  Laterality: N/A;  . ESOPHAGOGASTRODUODENOSCOPY N/A 11/27/2017   Procedure: ESOPHAGOGASTRODUODENOSCOPY (EGD);  Surgeon: WLucilla Lame MD;  [facility]  Service: Endoscopy;  Laterality: N/A;  . ESOPHAGOGASTRODUODENOSCOPY (EGD) WITH PROPOFOL N/A 06/02/2017   Procedure: ESOPHAGOGASTRODUODENOSCOPY (EGD) WITH PROPOFOL;  Surgeon: Toledo, TBenay Pike MD;  Location: [facility] ENDOSCOPY;  Service: Gastroenterology;  Laterality: N/A;  . GIVENS CAPSULE STUDY N/A 11/25/2017   Procedure: GIVENS CAPSULE STUDY;  Surgeon: AJonathon Bellows MD;  [facility]  Service: Gastroenterology;  Laterality: N/A;  . HAND SURGERY      Home Medications:  Allergies as of 09/11/2019      Reactions   Shrimp [shellfish Allergy] Anaphylaxis      Medication List       Accurate as of September 11, 2019 11:59 PM. If you have any questions, ask your nurse or doctor.        STOP taking these medications   acetaminophen 325 MG tablet Commonly known as: TYLENOL Stopped by: Kelin Borum, PA-C   albuterol 108 (90 Base) MCG/ACT inhaler Commonly known as: VENTOLIN HFA Stopped by: SZara Council PA-C   alum & mag hydroxide-simeth 200-200-20 MG/5ML  suspension Commonly known as: MAALOX/MYLANTA Stopped by: Zara Council, PA-C   amiodarone 200 MG tablet Commonly known as: PACERONE Stopped by: Maura Braaten, PA-C   bisacodyl 10 MG suppository Commonly known as: DULCOLAX Stopped by: Rosena Bartle, PA-C   guaiFENesin-dextromethorphan 100-10 MG/5ML  syrup Commonly known as: ROBITUSSIN DM Stopped by: Mathilde Mcwherter, PA-C   insulin glargine 100 UNIT/ML injection Commonly known as: LANTUS Stopped by: Jahmil Macleod, PA-C   lactulose 10 GM/15ML solution Commonly known as: CHRONULAC Stopped by: Charmine Bockrath, PA-C   midodrine 2.5 MG tablet Commonly known as: PROAMATINE Stopped by: Ryenne Lynam, PA-C   polyethylene glycol 17 g packet Commonly known as: MIRALAX / GLYCOLAX Stopped by: Naylah Cork, PA-C   sucralfate 1 GM/10ML suspension Commonly known as: CARAFATE Stopped by: Edinson Domeier, PA-C     TAKE these medications   allopurinol 100 MG tablet Commonly known as: ZYLOPRIM Take 100 mg by mouth daily.   apixaban 5 MG Tabs tablet Commonly known as: ELIQUIS Take 1 tablet (5 mg total) by mouth 2 (two) times daily.   ascorbic acid 500 MG tablet Commonly known as: VITAMIN C Take 500 mg by mouth daily.   atorvastatin 20 MG tablet Commonly known as: LIPITOR Take 20 mg by mouth daily.   Breo Ellipta 100-25 MCG/INH Aepb Generic drug: fluticasone furoate-vilanterol Inhale 1 puff into the lungs daily.   colchicine 0.6 MG tablet Take 0.6 mg by mouth daily as needed (gout flare).   finasteride 5 MG tablet Commonly known as: PROSCAR Take 1 tablet (5 mg total) by mouth daily.   furosemide 40 MG tablet Commonly known as: LASIX Take 1 tablet (40 mg total) by mouth daily.   insulin aspart 100 UNIT/ML FlexPen Commonly known as: NOVOLOG Inject  three times daily before meals as directed. Max daily dose is 50 units.   Insulin Pen Needle 31G X 8 MM Misc BD Ultra-Fine Short Pen Needle 31 gauge x 5/16"   lisinopril 20 MG tablet Commonly known as: ZESTRIL Take 10 mg by mouth daily.   metFORMIN 1000 MG tablet Commonly known as: GLUCOPHAGE Take 1,000 mg by mouth 2 (two) times daily.   metoprolol tartrate 25 MG tablet Commonly known as: LOPRESSOR Take 25 mg by mouth 2 (two) times daily.   ondansetron 4 MG  disintegrating tablet Commonly known as: ZOFRAN-ODT Take 1 tablet by mouth every 8 (eight) hours as needed.   pantoprazole 40 MG tablet Commonly known as: PROTONIX Take 1 tablet (40 mg total) by mouth 2 (two) times daily before a meal.   senna 8.6 MG Tabs tablet Commonly known as: SENOKOT Take 2 tablets (17.2 mg total) by mouth 2 (two) times daily.   tamsulosin 0.4 MG Caps capsule Commonly known as: FLOMAX Take 0.4 mg by mouth every evening.   TRESIBA [place] Inject into the skin.   vitamin B-12 1000 MCG tablet Commonly known as: CYANOCOBALAMIN Take 1,000 mcg by mouth daily.   VITAMIN D (CHOLECALCIFEROL) PO Take 5,000 Units by mouth daily.       Allergies:  Allergies  Allergen Reactions  . Shrimp [Shellfish Allergy] Anaphylaxis    Family History: Family History  Problem Relation Age of Onset  . Heart disease Mother   . Cancer Father   . Cancer Paternal Grandfather     Social History:   reports that he quit smoking about 26 years ago. His smoking use included cigarettes. He has a 30.00 pack-year smoking history. He has never used smokeless tobacco. He reports that he  does not drink alcohol or use drugs.  ROS: For pertinent review of systems please refer to history of present illness  Physical Exam: BP 110/66   Pulse 86   Ht 5' 8" (1.727 m)   Wt 181 lb (82.1 kg)   BMI 27.52 kg/m   Constitutional:  Well nourished. Alert and oriented, No acute distress. HEENT: [place] AT, moist mucus membranes.  Trachea midline, no masses. Cardiovascular: No clubbing, cyanosis, or edema. Respiratory: On nasal cannula.  Increased respiratory effort, increased work of breathing. GI: Abdomen is soft, non tender, non distended, no abdominal masses. Liver and spleen not palpable.   GU: No CVA tenderness.  No bladder fullness or masses.  Patient with uncircumcised phallus.  Foreskin easily retracted  Urethral meatus is patent.  No penile discharge. No penile lesions or rashes. Scrotum without  lesions, cysts, rashes and/or edema.   Neurologic: Grossly intact, no focal deficits, moving all 4 extremities. Psychiatric: Normal mood and affect.  Laboratory Data: Results for orders placed or performed in visit on 09/11/19  Bladder Scan (Post Void Residual) in office  Result Value Ref Range   Scan Result 668    Continuous Intermittent Catheterization  Due to urinary retention patient is present today for a teaching of self I & O Catheterization. Patient and wife were given detailed verbal and printed instructions of self catheterization. Patient was cleaned and prepped in a sterile fashion.  With instruction and assistance patient's wife inserted a 14 FR Speedi-cath and urine return was noted 600 ml, urine was yellow in color. Patient tolerated well, no complications were noted Patient was given a sample bag with supplies to take home.  Instructions were given for patient to cath one time daily.  An order was placed with [employer] for catheters to be sent to the patient's home. Patient is to follow up in one week.    Performed by: Zara Council, PA-C  Assessment & Plan:    1. Benign prostatic hyperplasia with urinary retention - BLADDER SCAN AMB NON-IMAGING  - patient's wife instructed to cath him once daily and to record volume - order placed with [facility] - RTC in one week for recheck - May try to increase his tamulosin to twice daily   Return in about 1 week (around 09/18/2019) for recheck .  Zara Council, [facility] [address], [place], [place] [address] [phone]   A total of 30 minutes were spent face-to-face with the patient, greater than 50% was spent in patient education, counseling, and coordination of care regarding instruction with self catheterization.

## 2019-09-14 ENCOUNTER — Other Ambulatory Visit: Payer: Self-pay

## 2019-09-14 DIAGNOSIS — D649 Anemia, unspecified: Secondary | ICD-10-CM

## 2019-09-15 NOTE — Progress Notes (Signed)
[place]  [phone]) [phone] [phone]) [phone]  ID: Jay Morris OB: 03/24/1946  MR#: [id number]  OPF#:[id number]  Patient Care Team: [name], MD as PCP - General (Internal Medicine)  CHIEF COMPLAINT: Anemia, unspecified.  INTERVAL HISTORY: Patient returns to clinic today for repeat laboratory work, further evaluation, and initiation of Retacrit.  He continues to have chronic weakness and fatigue, but otherwise feels well.  He has no neurologic complaints.  He denies any recent fevers or illnesses.  He has a good appetite and denies weight loss.  He has no chest pain, shortness of breath, cough, or hemoptysis.  He denies any nausea, vomiting, constipation, or diarrhea.  He has no melena or hematochezia.  He has no urinary complaints.  Patient offers no further specific complaints today.  REVIEW OF SYSTEMS:   Review of Systems  Constitutional: Positive for malaise/fatigue. Negative for fever and weight loss.  Respiratory: Negative.  Negative for cough, hemoptysis and shortness of breath.   Cardiovascular: Negative.  Negative for chest pain and leg swelling.  Gastrointestinal: Negative.  Negative for abdominal pain, blood in stool and melena.  Genitourinary: Negative.  Negative for hematuria.  Musculoskeletal: Negative.  Negative for back pain.  Skin: Negative.  Negative for rash.  Neurological: Positive for weakness. Negative for dizziness, focal weakness and headaches.  Psychiatric/Behavioral: Negative.  The patient is not nervous/anxious.     As per HPI. Otherwise, a complete review of systems is negative.  PAST MEDICAL HISTORY: Past Medical History:  Diagnosis Date  . Asthma   . Atrial fibrillation ([place])   . BPH (benign prostatic hyperplasia)   . COPD (chronic obstructive pulmonary disease) ([place])   . Diabetes mellitus without complication ([place])   . Helicobacter pylori gastritis   . Hyperlipidemia   . Hypertension   . Pneumonia      PAST SURGICAL HISTORY: Past Surgical History:  Procedure Laterality Date  . CARDIAC ELECTROPHYSIOLOGY MAPPING AND ABLATION    . CARDIOVERSION N/A 06/22/2019   Procedure: CARDIOVERSION;  Surgeon: Fay Records, MD;  Location: [facility];  Service: Cardiovascular;  Laterality: N/A;  . COLONOSCOPY WITH PROPOFOL N/A 06/15/2017   Procedure: COLONOSCOPY WITH PROPOFOL;  Surgeon: Toledo, Benay Pike, MD;  Location: [facility] ENDOSCOPY;  Service: Gastroenterology;  Laterality: N/A;  . ENTEROSCOPY N/A 11/24/2017   Procedure: ENTEROSCOPY;  Surgeon: Jonathon Bellows, MD;  Location: [facility] ENDOSCOPY;  Service: Gastroenterology;  Laterality: N/A;  . ESOPHAGOGASTRODUODENOSCOPY N/A 11/27/2017   Procedure: ESOPHAGOGASTRODUODENOSCOPY (EGD);  Surgeon: Lucilla Lame, MD;  Location: [facility] ENDOSCOPY;  Service: Endoscopy;  Laterality: N/A;  . ESOPHAGOGASTRODUODENOSCOPY (EGD) WITH PROPOFOL N/A 06/02/2017   Procedure: ESOPHAGOGASTRODUODENOSCOPY (EGD) WITH PROPOFOL;  Surgeon: Toledo, Benay Pike, MD;  Location: [facility] ENDOSCOPY;  Service: Gastroenterology;  Laterality: N/A;  . GIVENS CAPSULE STUDY N/A 11/25/2017   Procedure: GIVENS CAPSULE STUDY;  Surgeon: Jonathon Bellows, MD;  Location: [facility] ENDOSCOPY;  Service: Gastroenterology;  Laterality: N/A;  . HAND SURGERY      FAMILY HISTORY: Family History  Problem Relation Age of Onset  . Heart disease Mother   . Cancer Father   . Cancer Paternal Grandfather     ADVANCED DIRECTIVES (Y/N):  N  HEALTH MAINTENANCE: Social History   Tobacco Use  . Smoking status: Former Smoker    Packs/day: 1.00    Years: 30.00    Pack years: 30.00    Types: Cigarettes    Quit date: 1995    Years since quitting: 26.1  . Smokeless tobacco: Never Used  Substance Use  Topics  . Alcohol use: No    Alcohol/week: 0.0 standard drinks    Comment: occasional  . Drug use: No     Colonoscopy:  PAP:  Bone density:  Lipid panel:  Allergies  Allergen Reactions  . Shrimp [Shellfish Allergy]  Anaphylaxis    Current Outpatient Medications  Medication Sig Dispense Refill  . allopurinol (ZYLOPRIM) 100 MG tablet Take 100 mg by mouth daily.    Marland Kitchen apixaban (ELIQUIS) 5 MG TABS tablet Take 1 tablet (5 mg total) by mouth 2 (two) times daily. 60 tablet 0  . ascorbic acid (VITAMIN C) 500 MG tablet Take 500 mg by mouth daily.    Marland Kitchen atorvastatin (LIPITOR) 20 MG tablet Take 20 mg by mouth daily.    Marland Kitchen BREO ELLIPTA 100-25 MCG/INH AEPB Inhale 1 puff into the lungs daily.    . colchicine 0.6 MG tablet Take 0.6 mg by mouth daily as needed (gout flare).    . finasteride (PROSCAR) 5 MG tablet Take 1 tablet (5 mg total) by mouth daily. 30 tablet 0  . furosemide (LASIX) 40 MG tablet Take 1 tablet (40 mg total) by mouth daily. 30 tablet 0  . insulin aspart (NOVOLOG) 100 UNIT/ML FlexPen Inject  three times daily before meals as directed. Max daily dose is 50 units.    . Insulin Degludec (TRESIBA [place]) Inject into the skin.    . Insulin Pen Needle 31G X 8 MM MISC BD Ultra-Fine Short Pen Needle 31 gauge x 5/16"    . lisinopril (ZESTRIL) 20 MG tablet Take 10 mg by mouth daily.    . metFORMIN (GLUCOPHAGE) 1000 MG tablet Take 1,000 mg by mouth 2 (two) times daily.    . metoprolol tartrate (LOPRESSOR) 25 MG tablet Take 25 mg by mouth 2 (two) times daily.    . ondansetron (ZOFRAN-ODT) 4 MG disintegrating tablet Take 1 tablet by mouth every 8 (eight) hours as needed.    . pantoprazole (PROTONIX) 40 MG tablet Take 1 tablet (40 mg total) by mouth 2 (two) times daily before a meal. 60 tablet 0  . senna (SENOKOT) 8.6 MG TABS tablet Take 2 tablets (17.2 mg total) by mouth 2 (two) times daily. 60 tablet 0  . tamsulosin (FLOMAX) 0.4 MG CAPS capsule Take 0.4 mg by mouth every evening.     . vitamin B-12 (CYANOCOBALAMIN) 1000 MCG tablet Take 1,000 mcg by mouth daily.    Marland Kitchen VITAMIN D, CHOLECALCIFEROL, PO Take 5,000 Units by mouth daily.     No current facility-administered medications for this visit.    OBJECTIVE: Vitals:    09/17/19 1410  BP: (!) 127/57  Pulse: 88  Resp: (!) 22  Temp: (!) 96.8 F (36 C)  SpO2: 94%     Body mass index is 28.07 kg/m.    ECOG FS:1 - Symptomatic but completely ambulatory  General: Well-developed, well-nourished, no acute distress.  Sitting in a wheelchair. Eyes: Pink conjunctiva, anicteric sclera. HEENT: Normocephalic, moist mucous membranes. Lungs: No audible wheezing or coughing. Heart: Regular rate and rhythm. Abdomen: Soft, nontender, no obvious distention. Musculoskeletal: No edema, cyanosis, or clubbing. Neuro: Alert, answering all questions appropriately. Cranial nerves grossly intact. Skin: No rashes or petechiae noted. Psych: Normal affect.  LAB RESULTS:  Lab Results  Component Value Date   NA 136 06/25/2019   K 3.8 06/25/2019   CL 98 06/25/2019   CO2 28 06/25/2019   GLUCOSE 122 (H) 06/25/2019   BUN 14 06/25/2019   CREATININE 1.42 (H) 06/25/2019  CALCIUM 8.1 (L) 06/25/2019   PROT 5.3 (L) 06/23/2019   ALBUMIN 2.1 (L) 06/23/2019   AST 27 06/23/2019   ALT 26 06/23/2019   ALKPHOS 31 (L) 06/23/2019   BILITOT 1.4 (H) 06/23/2019   GFRNONAA 48 (L) 06/25/2019   GFRAA 56 (L) 06/25/2019    Lab Results  Component Value Date   WBC 10.0 09/03/2019   NEUTROABS 2.3 06/25/2019   HGB 8.2 (L) 09/17/2019   HCT 28.9 (L) 09/03/2019   MCV 98.0 09/03/2019   PLT 275 09/03/2019     STUDIES: No results found.  ASSESSMENT: Anemia, unspecified.  PLAN:   1. Anemia, unspecified: Patient's hemoglobin continues to trend down and is now 8.2.  Previously, all of his other laboratory work including iron stores, B12, folate, hemolysis labs are all negative or within normal limits.  His reticulocyte count is inappropriately normal. IntelliGEN myeloid panel is pending at time of dictation.  His erythropoietin level is only mildly elevated, but still inappropriately low for this level of anemia.  Given his longstanding chronic renal insufficiency, he will benefit from  40,000 units of Retacrit.  Proceed with treatment today.  Return to clinic in 1, 2, and 3 months for laboratory work and Retacrit if his hemoglobin remains below 10.0.  Patient would then return to clinic in 4 months for repeat laboratory work, further evaluation, and continuation of treatment. 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  This is likely clinically insignificant and unrelated to his anemia.  Continue to monitor and repeat in 6 months.  I spent a total of 30 minutes reviewing chart data, face-to-face evaluation with the patient, counseling and coordination of care as detailed above.   Patient expressed understanding and was in agreement with this plan. He also understands that He can call clinic at any time with any questions, concerns, or complaints.    Lloyd Huger, MD   09/17/2019 3:51 PM

## 2019-09-17 ENCOUNTER — Encounter: Payer: Self-pay | Admitting: Oncology

## 2019-09-17 ENCOUNTER — Inpatient Hospital Stay: Payer: Medicare HMO | Attending: Oncology

## 2019-09-17 ENCOUNTER — Inpatient Hospital Stay (HOSPITAL_BASED_OUTPATIENT_CLINIC_OR_DEPARTMENT_OTHER): Payer: Medicare HMO | Admitting: Oncology

## 2019-09-17 ENCOUNTER — Inpatient Hospital Stay: Payer: Medicare HMO

## 2019-09-17 ENCOUNTER — Other Ambulatory Visit: Payer: Self-pay

## 2019-09-17 VITALS — BP 127/57 | HR 88 | Temp 96.8°F | Resp 22 | Wt 184.6 lb

## 2019-09-17 DIAGNOSIS — D472 Monoclonal gammopathy: Secondary | ICD-10-CM | POA: Diagnosis not present

## 2019-09-17 DIAGNOSIS — N189 Chronic kidney disease, unspecified: Secondary | ICD-10-CM | POA: Diagnosis not present

## 2019-09-17 DIAGNOSIS — D649 Anemia, unspecified: Secondary | ICD-10-CM | POA: Diagnosis present

## 2019-09-17 LAB — HEMOGLOBIN: Hemoglobin: 8.2 g/dL — ABNORMAL LOW (ref 13.0–17.0)

## 2019-09-17 MED ORDER — EPOETIN ALFA-EPBX 40000 UNIT/ML IJ SOLN
40000.0000 [IU] | Freq: Once | INTRAMUSCULAR | Status: AC
  Administered 2019-09-17: 40000 [IU] via SUBCUTANEOUS
  Filled 2019-09-17: qty 1

## 2019-09-17 NOTE — Progress Notes (Signed)
Patient reports that his fatigue and SOBr on exertion is worsening.

## 2019-09-20 ENCOUNTER — Encounter: Payer: Self-pay | Admitting: Urology

## 2019-09-20 ENCOUNTER — Ambulatory Visit (INDEPENDENT_AMBULATORY_CARE_PROVIDER_SITE_OTHER): Payer: Medicare HMO | Admitting: Urology

## 2019-09-20 ENCOUNTER — Other Ambulatory Visit: Payer: Self-pay

## 2019-09-20 VITALS — BP 148/72 | HR 112 | Ht 68.0 in | Wt 181.0 lb

## 2019-09-20 DIAGNOSIS — N401 Enlarged prostate with lower urinary tract symptoms: Secondary | ICD-10-CM

## 2019-09-20 DIAGNOSIS — R338 Other retention of urine: Secondary | ICD-10-CM

## 2019-09-20 NOTE — Progress Notes (Signed)
09/20/2019 3:14 PM   Jay Morris 07-Nov-1945 [id number]  CC: Acute urinary retention follow-up  HPI: Jay Morris is a 74 y.o. male who presents today for follow-up of acute urinary retention.    He was previously seen by Dr. Bernardo Heater on 08/03/2019 for the same.    He initially developed urinary retention during hospitalization for COVID-19 pneumonia on 06/15/2019.  Bladder scan with >1022m; this was associated with constipation x3 days.  Foley catheter was placed at that time, finasteride was started, and tamsulosin was increased to 0.8 mg daily.  Foley was removed on 06/17/2019 and patient was unable to void thereafter.  Foley catheter was replaced on 06/18/2019 with immediate drainage of 900 mL of urine.  Patient subsequently developed orthostatic hypotension and Flomax was decreased back to 0.4 mg daily.  Patient reports he is taking tamsulosin 0.4 mg and finasteride 5 mg daily.  PMH significant for BPH on tamsulosin for many years.  At his visit on 08/08/2019, His PVR today was 183 mL.  He stated he was voiding well with minimal straining.  He felt he was emptying his bladder completely.    At his visit on 09/11/2019, he was found to have a PVR of 668 mL.  His wife then was instructed on CIC technique.  Today, he states he is doing well.  His wife is not with him at this visit, but he states that she is not had difficulty with catheterizing him.  He was able to increase his tamsulosin to 0.4 mg twice daily without episodes of hypotension.  He states that he feels he is voiding more often on his own and that his postvoid residuals when his wife catheterizes him are getting smaller.  He did not bring in the recorded volumes.   PMH: Past Medical History:  Diagnosis Date  . Asthma   . [place]rial fibrillation ([place]   . BPH (benign prostatic hyperplasia)   . COPD (chronic obstructive pulmonary disease) ([place]   . Diabetes mellitus without complication ([place]   . Helicobacter pylori  gastritis   . Hyperlipidemia   . Hypertension   . Pneumonia     Surgical History: Past Surgical History:  Procedure Laterality Date  . CARDIAC ELECTROPHYSIOLOGY MAPPING AND ABLATION    . CARDIOVERSION N/A 06/22/2019   Procedure: CARDIOVERSION;  Surgeon: RFay Records MD;  [place]  Service: Cardiovascular;  Laterality: N/A;  . COLONOSCOPY WITH PROPOFOL N/A 06/15/2017   Procedure: COLONOSCOPY WITH PROPOFOL;  Surgeon: Toledo, TBenay Pike MD;  Location: [facility] ENDOSCOPY;  Service: Gastroenterology;  Laterality: N/A;  . ENTEROSCOPY N/A 11/24/2017   Procedure: ENTEROSCOPY;  Surgeon: AJonathon Bellows MD;  [facility];  Service: Gastroenterology;  Laterality: N/A;  . ESOPHAGOGASTRODUODENOSCOPY N/A 11/27/2017   Procedure: ESOPHAGOGASTRODUODENOSCOPY (EGD);  Surgeon: WLucilla Lame MD;  [facility]  Service: Endoscopy;  Laterality: N/A;  . ESOPHAGOGASTRODUODENOSCOPY (EGD) WITH PROPOFOL N/A 06/02/2017   Procedure: ESOPHAGOGASTRODUODENOSCOPY (EGD) WITH PROPOFOL;  Surgeon: Toledo, TBenay Pike MD;  Location: [facility] ENDOSCOPY;  Service: Gastroenterology;  Laterality: N/A;  . GIVENS CAPSULE STUDY N/A 11/25/2017   Procedure: GIVENS CAPSULE STUDY;  Surgeon: AJonathon Bellows MD;  [facility];  Service: Gastroenterology;  Laterality: N/A;  . HAND SURGERY      Home Medications:  Allergies as of 09/20/2019      Reactions   Shrimp [shellfish Allergy] Anaphylaxis      Medication List       Accurate as of September 20, 2019 11:59 PM. If you have  any questions, ask your nurse or doctor.        allopurinol 100 MG tablet Commonly known as: ZYLOPRIM Take 100 mg by mouth daily.   apixaban 5 MG Tabs tablet Commonly known as: ELIQUIS Take 1 tablet (5 mg total) by mouth 2 (two) times daily.   ascorbic acid 500 MG tablet Commonly known as: VITAMIN C Take 500 mg by mouth daily.   atorvastatin 20 MG tablet Commonly known as: LIPITOR Take 20 mg by mouth daily.   Breo  Ellipta 100-25 MCG/INH Aepb Generic drug: fluticasone furoate-vilanterol Inhale 1 puff into the lungs daily.   colchicine 0.6 MG tablet Take 0.6 mg by mouth daily as needed (gout flare).   finasteride 5 MG tablet Commonly known as: PROSCAR Take 1 tablet (5 mg total) by mouth daily.   furosemide 40 MG tablet Commonly known as: LASIX Take 1 tablet (40 mg total) by mouth daily.   insulin aspart 100 UNIT/ML FlexPen Commonly known as: NOVOLOG Inject  three times daily before meals as directed. Max daily dose is 50 units.   Insulin Pen Needle 31G X 8 MM Misc BD Ultra-Fine Short Pen Needle 31 gauge x 5/16"   lisinopril 20 MG tablet Commonly known as: ZESTRIL Take 10 mg by mouth daily.   metFORMIN 1000 MG tablet Commonly known as: GLUCOPHAGE Take 1,000 mg by mouth 2 (two) times daily.   metoprolol tartrate 25 MG tablet Commonly known as: LOPRESSOR Take 25 mg by mouth 2 (two) times daily.   ondansetron 4 MG disintegrating tablet Commonly known as: ZOFRAN-ODT Take 1 tablet by mouth every 8 (eight) hours as needed.   pantoprazole 40 MG tablet Commonly known as: PROTONIX Take 1 tablet (40 mg total) by mouth 2 (two) times daily before a meal.   senna 8.6 MG Tabs tablet Commonly known as: SENOKOT Take 2 tablets (17.2 mg total) by mouth 2 (two) times daily.   tamsulosin 0.4 MG Caps capsule Commonly known as: FLOMAX Take 0.4 mg by mouth every evening.   TRESIBA [place] Inject into the skin.   vitamin B-12 1000 MCG tablet Commonly known as: CYANOCOBALAMIN Take 1,000 mcg by mouth daily.   VITAMIN D (CHOLECALCIFEROL) PO Take 5,000 Units by mouth daily.       Allergies:  Allergies  Allergen Reactions  . Shrimp [Shellfish Allergy] Anaphylaxis    Family History: Family History  Problem Relation Age of Onset  . Heart disease Mother   . Cancer Father   . Cancer Paternal Grandfather     Social History:   reports that he quit smoking about 26 years ago. His smoking use  included cigarettes. He has a 30.00 pack-year smoking history. He has never used smokeless tobacco. He reports that he does not drink alcohol or use drugs.  ROS: For pertinent review of systems please refer to history of present illness  Physical Exam: BP (!) 148/72   Pulse (!) 112   Ht _0  (1.727 m)   Wt 181 lb (82.1 kg)   BMI 27.52 kg/m   Constitutional:  Well nourished. Alert and oriented, No acute distress. HEENT: [place] AT, mask in place.  Trachea midline, no masses.  On oxygen.  Cardiovascular: No clubbing, cyanosis, or edema. Respiratory: Normal respiratory effort, no increased work of breathing. Neurologic: Grossly intact, no focal deficits, moving all 4 extremities. Psychiatric: Normal mood and affect.  Laboratory Data: Results for orders placed or performed in visit on 09/17/19  Hemoglobin  Result Value Ref Range  Hemoglobin 8.2 (L) 13.0 - 17.0 g/dL    Assessment & Plan:    1. Benign prostatic hyperplasia with urinary retention - continue with CIC - continue tamsulosin 0.4 mg twice daily - follow up in one month for I PSS and PVR   Return in about 1 month (around 10/21/2019) for IPSS and PVR.  Zara Council, [facility] [address], [place], [place] [address] [phone]   A total of 30 minutes were spent face-to-face with the patient, greater than 50% was spent in patient education, counseling, and coordination of care regarding instruction with self catheterization.

## 2019-10-02 ENCOUNTER — Other Ambulatory Visit: Payer: Self-pay | Admitting: Hematology and Oncology

## 2019-10-02 ENCOUNTER — Other Ambulatory Visit: Payer: Self-pay | Admitting: Specialist

## 2019-10-02 DIAGNOSIS — R0602 Shortness of breath: Secondary | ICD-10-CM

## 2019-10-02 DIAGNOSIS — Z8616 Personal history of COVID-19: Secondary | ICD-10-CM

## 2019-10-02 DIAGNOSIS — R0902 Hypoxemia: Secondary | ICD-10-CM

## 2019-10-03 ENCOUNTER — Ambulatory Visit
Admission: RE | Admit: 2019-10-03 | Discharge: 2019-10-03 | Disposition: A | Discharge status: Home / Self Care | Payer: Medicare HMO | Source: Ambulatory Visit | Attending: Specialist | Admitting: Specialist

## 2019-10-03 ENCOUNTER — Other Ambulatory Visit: Payer: Self-pay

## 2019-10-03 DIAGNOSIS — Z8616 Personal history of COVID-19: Secondary | ICD-10-CM | POA: Insufficient documentation

## 2019-10-03 DIAGNOSIS — R0902 Hypoxemia: Secondary | ICD-10-CM

## 2019-10-03 DIAGNOSIS — R0602 Shortness of breath: Secondary | ICD-10-CM | POA: Insufficient documentation

## 2019-10-03 LAB — POCT I-STAT CREATININE: Creatinine, Ser: 1.6 mg/dL — ABNORMAL HIGH (ref 0.61–1.24)

## 2019-10-03 MED ORDER — IOHEXOL 350 MG/ML SOLN
75.0000 mL | Freq: Once | INTRAVENOUS | Status: AC | PRN
  Administered 2019-10-03: 60 mL via INTRAVENOUS

## 2019-10-11 NOTE — Progress Notes (Signed)
Pharmacist Chemotherapy Monitoring - Follow Up Assessment    I verify that I have reviewed each item in the below checklist:  . Regimen for the patient is scheduled for the appropriate day and plan matches scheduled date. Marland Kitchen Appropriate non-routine labs are ordered dependent on drug ordered. . If applicable, additional medications reviewed and ordered per protocol based on lifetime cumulative doses and/or treatment regimen.   Plan for follow-up and/or issues identified: No . I-vent associated with next due treatment: No . MD and/or nursing notified: Non  Jay Morris K 10/11/2019 8:48 AM

## 2019-10-17 ENCOUNTER — Other Ambulatory Visit: Payer: Self-pay | Admitting: Emergency Medicine

## 2019-10-17 DIAGNOSIS — D472 Monoclonal gammopathy: Secondary | ICD-10-CM

## 2019-10-18 ENCOUNTER — Inpatient Hospital Stay: Payer: Medicare HMO | Attending: Oncology

## 2019-10-18 ENCOUNTER — Inpatient Hospital Stay: Payer: Medicare HMO

## 2019-10-18 ENCOUNTER — Other Ambulatory Visit: Payer: Self-pay

## 2019-10-18 VITALS — BP 135/61 | HR 59

## 2019-10-18 DIAGNOSIS — N189 Chronic kidney disease, unspecified: Secondary | ICD-10-CM | POA: Insufficient documentation

## 2019-10-18 DIAGNOSIS — D649 Anemia, unspecified: Secondary | ICD-10-CM | POA: Insufficient documentation

## 2019-10-18 DIAGNOSIS — D472 Monoclonal gammopathy: Secondary | ICD-10-CM

## 2019-10-18 LAB — CBC WITH DIFFERENTIAL/PLATELET
Abs Immature Granulocytes: 0.08 10*3/uL — ABNORMAL HIGH (ref 0.00–0.07)
Basophils Absolute: 0 10*3/uL (ref 0.0–0.1)
Basophils Relative: 0 %
Eosinophils Absolute: 0.1 10*3/uL (ref 0.0–0.5)
Eosinophils Relative: 1 %
HCT: 30.8 % — ABNORMAL LOW (ref 39.0–52.0)
Hemoglobin: 9.7 g/dL — ABNORMAL LOW (ref 13.0–17.0)
Immature Granulocytes: 1 %
Lymphocytes Relative: 10 %
Lymphs Abs: 1.5 10*3/uL (ref 0.7–4.0)
MCH: 31.2 pg (ref 26.0–34.0)
MCHC: 31.5 g/dL (ref 30.0–36.0)
MCV: 99 fL (ref 80.0–100.0)
Monocytes Absolute: 0.7 10*3/uL (ref 0.1–1.0)
Monocytes Relative: 5 %
Neutro Abs: 12.4 10*3/uL — ABNORMAL HIGH (ref 1.7–7.7)
Neutrophils Relative %: 83 %
Platelets: 250 10*3/uL (ref 150–400)
RBC: 3.11 MIL/uL — ABNORMAL LOW (ref 4.22–5.81)
RDW: 16 % — ABNORMAL HIGH (ref 11.5–15.5)
WBC: 14.8 10*3/uL — ABNORMAL HIGH (ref 4.0–10.5)
nRBC: 0 % (ref 0.0–0.2)

## 2019-10-18 LAB — BASIC METABOLIC PANEL
Anion gap: 12 (ref 5–15)
BUN: 44 mg/dL — ABNORMAL HIGH (ref 8–23)
CO2: 31 mmol/L (ref 22–32)
Calcium: 9.7 mg/dL (ref 8.9–10.3)
Chloride: 95 mmol/L — ABNORMAL LOW (ref 98–111)
Creatinine, Ser: 1.89 mg/dL — ABNORMAL HIGH (ref 0.61–1.24)
GFR calc Af Amer: 39 mL/min — ABNORMAL LOW (ref >60)
GFR calc non Af Amer: 34 mL/min — ABNORMAL LOW (ref >60)
Glucose, Bld: 228 mg/dL — ABNORMAL HIGH (ref 70–99)
Potassium: 4.2 mmol/L (ref 3.5–5.1)
Sodium: 138 mmol/L (ref 135–145)

## 2019-10-18 MED ORDER — EPOETIN ALFA-EPBX 40000 UNIT/ML IJ SOLN
40000.0000 [IU] | Freq: Once | INTRAMUSCULAR | Status: AC
  Administered 2019-10-18: 12:00:00 40000 [IU] via SUBCUTANEOUS
  Filled 2019-10-18: qty 1

## 2019-10-19 LAB — KAPPA/LAMBDA LIGHT CHAINS
Kappa free light chain: 43.6 mg/L — ABNORMAL HIGH (ref 3.3–19.4)
Kappa, lambda light chain ratio: 1.47 (ref 0.26–1.65)
Lambda free light chains: 29.6 mg/L — ABNORMAL HIGH (ref 5.7–26.3)

## 2019-10-19 LAB — PROTEIN ELECTROPHORESIS, SERUM
A/G Ratio: 1.3 (ref 0.7–1.7)
Albumin ELP: 3.9 g/dL (ref 2.9–4.4)
Alpha-1-Globulin: 0.2 g/dL (ref 0.0–0.4)
Alpha-2-Globulin: 1 g/dL (ref 0.4–1.0)
Beta Globulin: 1 g/dL (ref 0.7–1.3)
Gamma Globulin: 1 g/dL (ref 0.4–1.8)
Globulin, Total: 3.1 g/dL (ref 2.2–3.9)
M-Spike, %: 0.3 g/dL — ABNORMAL HIGH
Total Protein ELP: 7 g/dL (ref 6.0–8.5)

## 2019-10-19 LAB — IGG, IGA, IGM
IgA: 178 mg/dL (ref 61–437)
IgG (Immunoglobin G), Serum: 995 mg/dL (ref 603–1613)
IgM (Immunoglobulin M), Srm: 24 mg/dL (ref 15–143)

## 2019-10-25 ENCOUNTER — Ambulatory Visit (INDEPENDENT_AMBULATORY_CARE_PROVIDER_SITE_OTHER): Payer: Medicare HMO | Admitting: Physician Assistant

## 2019-10-25 ENCOUNTER — Encounter: Payer: Self-pay | Admitting: Urology

## 2019-10-25 ENCOUNTER — Other Ambulatory Visit: Payer: Self-pay

## 2019-10-25 VITALS — BP 166/68 | HR 57 | Ht 68.0 in | Wt 193.0 lb

## 2019-10-25 DIAGNOSIS — N401 Enlarged prostate with lower urinary tract symptoms: Secondary | ICD-10-CM

## 2019-10-25 DIAGNOSIS — R338 Other retention of urine: Secondary | ICD-10-CM | POA: Diagnosis not present

## 2019-10-25 LAB — BLADDER SCAN AMB NON-IMAGING: Scan Result: >572

## 2019-10-25 MED ORDER — TAMSULOSIN HCL 0.4 MG PO CAPS: 0.8000 mg | ORAL_CAPSULE | Freq: Every evening | ORAL | 0 refills | Status: DC

## 2019-10-25 NOTE — Progress Notes (Signed)
10/25/2019 2:21 PM   Jay Morris 1946-06-04 [id number]  CC: Urinary retention follow-up  HPI: Jay Morris is a 74 y.o. male with a recent history of urinary retention following hospitalization with COVID-19 pneumonia now managed by [facility] who presents today for follow-up.  He was seen most recently by [facility] on 09/20/2019 for the same.  Today, he reports not cathing daily as previously instructed.  He states he was cathed most recently 2 days ago around 8 PM.  PVR today >582m.  He denies discomfort associated with this.  He continues to take Flomax 0.4 mg twice daily and finasteride 5 mg daily.  IPSS today 5/1, as below. IPSS    Row Name 10/25/19 1400         International Prostate Symptom Score   How often have you had the sensation of not emptying your bladder?  Not at All     How often have you had to urinate less than every two hours?  Not at All     How often have you found you stopped and started again several times when you urinated?  Not at All     How often have you found it difficult to postpone urination?  Not at All     How often have you had a weak urinary stream?  Less than half the time     How often have you had to strain to start urination?  Less than half the time     How many times did you typically get up at night to urinate?  1 Time     Total IPSS Score  5       Quality of Life due to urinary symptoms   If you were to spend the rest of your life with your urinary condition just the way it is now how would you feel about that?  Pleased       PMH: Past Medical History:  Diagnosis Date  . Asthma   . Atrial fibrillation ([place]   . BPH (benign prostatic hyperplasia)   . COPD (chronic obstructive pulmonary disease) ([place]   . Diabetes mellitus without complication ([place]   . Helicobacter pylori gastritis   . Hyperlipidemia   . Hypertension   . Pneumonia     Surgical History: Past Surgical History:  Procedure Laterality Date  . CARDIAC  ELECTROPHYSIOLOGY MAPPING AND ABLATION    . CARDIOVERSION N/A 06/22/2019   Procedure: CARDIOVERSION;  Surgeon: RFay Records MD;  [place]  Service: Cardiovascular;  Laterality: N/A;  . COLONOSCOPY WITH PROPOFOL N/A 06/15/2017   Procedure: COLONOSCOPY WITH PROPOFOL;  Surgeon: Toledo, TBenay Pike MD;  Location: [facility] ENDOSCOPY;  Service: Gastroenterology;  Laterality: N/A;  . ENTEROSCOPY N/A 11/24/2017   Procedure: ENTEROSCOPY;  Surgeon: AJonathon Bellows MD;  [facility];  Service: Gastroenterology;  Laterality: N/A;  . ESOPHAGOGASTRODUODENOSCOPY N/A 11/27/2017   Procedure: ESOPHAGOGASTRODUODENOSCOPY (EGD);  Surgeon: WLucilla Lame MD;  [facility]  Service: Endoscopy;  Laterality: N/A;  . ESOPHAGOGASTRODUODENOSCOPY (EGD) WITH PROPOFOL N/A 06/02/2017   Procedure: ESOPHAGOGASTRODUODENOSCOPY (EGD) WITH PROPOFOL;  Surgeon: Toledo, TBenay Pike MD;  Location: [facility] ENDOSCOPY;  Service: Gastroenterology;  Laterality: N/A;  . GIVENS CAPSULE STUDY N/A 11/25/2017   Procedure: GIVENS CAPSULE STUDY;  Surgeon: AJonathon Bellows MD;  [facility];  Service: Gastroenterology;  Laterality: N/A;  . HAND SURGERY      Home Medications:  Allergies as of 10/25/2019      Reactions   Shrimp [shellfish Allergy]  Anaphylaxis      Medication List       Accurate as of October 25, 2019  2:21 PM. If you have any questions, ask your nurse or doctor.        STOP taking these medications   ondansetron 4 MG disintegrating tablet Commonly known as: ZOFRAN-ODT Stopped by: SHANNON MCGOWAN, PA-C     TAKE these medications   allopurinol 100 MG tablet Commonly known as: ZYLOPRIM Take 100 mg by mouth daily.   apixaban 5 MG Tabs tablet Commonly known as: ELIQUIS Take 1 tablet (5 mg total) by mouth 2 (two) times daily.   ascorbic acid 500 MG tablet Commonly known as: VITAMIN C Take 500 mg by mouth daily.   atorvastatin 20 MG tablet Commonly known as: LIPITOR Take 20 mg by mouth  daily.   Breo Ellipta 100-25 MCG/INH Aepb Generic drug: fluticasone furoate-vilanterol Inhale 1 puff into the lungs daily.   colchicine 0.6 MG tablet Take 0.6 mg by mouth daily as needed (gout flare).   finasteride 5 MG tablet Commonly known as: PROSCAR Take 1 tablet (5 mg total) by mouth daily.   furosemide 40 MG tablet Commonly known as: LASIX Take 1 tablet (40 mg total) by mouth daily.   insulin aspart 100 UNIT/ML FlexPen Commonly known as: NOVOLOG Inject  three times daily before meals as directed. Max daily dose is 50 units.   Insulin Pen Needle 31G X 8 MM Misc BD Ultra-Fine Short Pen Needle 31 gauge x 5/16"   lisinopril 20 MG tablet Commonly known as: ZESTRIL Take 10 mg by mouth daily.   metFORMIN 1000 MG tablet Commonly known as: GLUCOPHAGE Take 1,000 mg by mouth 2 (two) times daily.   metoprolol tartrate 25 MG tablet Commonly known as: LOPRESSOR Take 25 mg by mouth 2 (two) times daily.   pantoprazole 40 MG tablet Commonly known as: PROTONIX Take 1 tablet (40 mg total) by mouth 2 (two) times daily before a meal.   senna 8.6 MG Tabs tablet Commonly known as: SENOKOT Take 2 tablets (17.2 mg total) by mouth 2 (two) times daily.   tamsulosin 0.4 MG Caps capsule Commonly known as: FLOMAX Take 0.4 mg by mouth every evening.   TRESIBA [place] Inject into the skin.   vitamin B-12 1000 MCG tablet Commonly known as: CYANOCOBALAMIN Take 1,000 mcg by mouth daily.   VITAMIN D (CHOLECALCIFEROL) PO Take 5,000 Units by mouth daily.       Allergies:  Allergies  Allergen Reactions  . Shrimp [Shellfish Allergy] Anaphylaxis    Family History: Family History  Problem Relation Age of Onset  . Heart disease Mother   . Cancer Father   . Cancer Paternal Grandfather     Social History:   reports that he quit smoking about 26 years ago. His smoking use included cigarettes. He has a 30.00 pack-year smoking history. He has never used smokeless tobacco. He reports that  he does not drink alcohol or use drugs.  Physical Exam: BP (!) 166/68   Pulse (!) 57   Ht _0  (1.727 m)   Wt 193 lb (87.5 kg)   BMI 29.35 kg/m   Constitutional:  Alert and oriented, no acute distress, nontoxic appearing HEENT: [place], AT Cardiovascular: No clubbing, cyanosis, or edema Respiratory: Normal respiratory effort, no increased work of breathing GI: Abdomen is soft, nontender, protuberant, visible umbilical hernia Skin: No rashes, bruises or suspicious lesions Neurologic: Grossly intact, no focal deficits, moving all 4 extremities Psychiatric: Normal mood and  affect  Laboratory Data: Results for orders placed or performed in visit on 10/25/19  Bladder Scan (Post Void Residual) in office  Result Value Ref Range   Scan Result >572    Assessment & Plan:   1. Benign prostatic hyperplasia with urinary retention 74 year old male with a recent history of urinary retention following hospitalization for COVID-19 pneumonia managed with [facility].  Patient is minimally symptomatic at this and has not been cathing once daily as instructed.  PVR elevated today without pain.  Had a long position with the patient and his wife today explaining that unmanaged urinary retention can lead to possible bladder damage and reduce the chance of him being able to regain the ability to spontaneously void in the future.  Strongly encouraged him to return to once daily [facility].  They expressed understanding.  1 week of catheter samples provided as they report being out and awaiting shipment.  Refilled Flomax 0.4 mg twice daily x3 months today. - Bladder Scan (Post Void Residual) in office  Return in about 3 months (around 01/24/2020) for PVR and IPSS.  Debroah Loop, PA-C  [facility] [address], [place], [place] [address] [phone]

## 2019-11-05 ENCOUNTER — Telehealth: Payer: Self-pay

## 2019-11-05 NOTE — Telephone Encounter (Signed)
Patient's wife called wanting to know what the number for the catheter company was for her husband. It was explained that [employer] is Market [employer] and not the distributor. She states they were told to contact [employer] She was given the number to contact for a refill [phone]

## 2019-11-21 ENCOUNTER — Other Ambulatory Visit: Payer: Self-pay | Admitting: Emergency Medicine

## 2019-11-21 DIAGNOSIS — D649 Anemia, unspecified: Secondary | ICD-10-CM

## 2019-11-22 ENCOUNTER — Other Ambulatory Visit: Payer: Self-pay

## 2019-11-22 ENCOUNTER — Inpatient Hospital Stay: Payer: Medicare HMO | Attending: Oncology

## 2019-11-22 ENCOUNTER — Inpatient Hospital Stay: Payer: Medicare HMO

## 2019-11-22 DIAGNOSIS — D649 Anemia, unspecified: Secondary | ICD-10-CM

## 2019-11-22 LAB — CBC WITH DIFFERENTIAL/PLATELET
Abs Immature Granulocytes: 0.11 10*3/uL — ABNORMAL HIGH (ref 0.00–0.07)
Basophils Absolute: 0 10*3/uL (ref 0.0–0.1)
Basophils Relative: 0 %
Eosinophils Absolute: 0.1 10*3/uL (ref 0.0–0.5)
Eosinophils Relative: 1 %
HCT: 33.4 % — ABNORMAL LOW (ref 39.0–52.0)
Hemoglobin: 10.6 g/dL — ABNORMAL LOW (ref 13.0–17.0)
Immature Granulocytes: 1 %
Lymphocytes Relative: 12 %
Lymphs Abs: 1.6 10*3/uL (ref 0.7–4.0)
MCH: 30.8 pg (ref 26.0–34.0)
MCHC: 31.7 g/dL (ref 30.0–36.0)
MCV: 97.1 fL (ref 80.0–100.0)
Monocytes Absolute: 1.2 10*3/uL — ABNORMAL HIGH (ref 0.1–1.0)
Monocytes Relative: 9 %
Neutro Abs: 10 10*3/uL — ABNORMAL HIGH (ref 1.7–7.7)
Neutrophils Relative %: 77 %
Platelets: 194 10*3/uL (ref 150–400)
RBC: 3.44 MIL/uL — ABNORMAL LOW (ref 4.22–5.81)
RDW: 16.1 % — ABNORMAL HIGH (ref 11.5–15.5)
WBC: 13 10*3/uL — ABNORMAL HIGH (ref 4.0–10.5)
nRBC: 0 % (ref 0.0–0.2)

## 2019-11-26 DIAGNOSIS — I272 Pulmonary hypertension, unspecified: Secondary | ICD-10-CM | POA: Insufficient documentation

## 2019-12-20 ENCOUNTER — Other Ambulatory Visit: Payer: Self-pay

## 2019-12-20 ENCOUNTER — Inpatient Hospital Stay: Payer: Medicare HMO

## 2019-12-20 ENCOUNTER — Inpatient Hospital Stay: Payer: Medicare HMO | Attending: Oncology

## 2019-12-20 VITALS — BP 143/78 | HR 67

## 2019-12-20 DIAGNOSIS — D649 Anemia, unspecified: Secondary | ICD-10-CM | POA: Insufficient documentation

## 2019-12-20 DIAGNOSIS — N189 Chronic kidney disease, unspecified: Secondary | ICD-10-CM | POA: Diagnosis not present

## 2019-12-20 LAB — CBC WITH DIFFERENTIAL/PLATELET
Abs Immature Granulocytes: 0.11 10*3/uL — ABNORMAL HIGH (ref 0.00–0.07)
Basophils Absolute: 0 10*3/uL (ref 0.0–0.1)
Basophils Relative: 0 %
Eosinophils Absolute: 0.1 10*3/uL (ref 0.0–0.5)
Eosinophils Relative: 1 %
HCT: 30.4 % — ABNORMAL LOW (ref 39.0–52.0)
Hemoglobin: 9.7 g/dL — ABNORMAL LOW (ref 13.0–17.0)
Immature Granulocytes: 1 %
Lymphocytes Relative: 12 %
Lymphs Abs: 1.4 10*3/uL (ref 0.7–4.0)
MCH: 30.8 pg (ref 26.0–34.0)
MCHC: 31.9 g/dL (ref 30.0–36.0)
MCV: 96.5 fL (ref 80.0–100.0)
Monocytes Absolute: 1 10*3/uL (ref 0.1–1.0)
Monocytes Relative: 9 %
Neutro Abs: 9 10*3/uL — ABNORMAL HIGH (ref 1.7–7.7)
Neutrophils Relative %: 77 %
Platelets: 207 10*3/uL (ref 150–400)
RBC: 3.15 MIL/uL — ABNORMAL LOW (ref 4.22–5.81)
RDW: 16.9 % — ABNORMAL HIGH (ref 11.5–15.5)
WBC: 11.7 10*3/uL — ABNORMAL HIGH (ref 4.0–10.5)
nRBC: 0 % (ref 0.0–0.2)

## 2019-12-20 MED ORDER — EPOETIN ALFA-EPBX 40000 UNIT/ML IJ SOLN
40000.0000 [IU] | Freq: Once | INTRAMUSCULAR | Status: AC
  Administered 2019-12-20: 40000 [IU] via SUBCUTANEOUS
  Filled 2019-12-20: qty 1

## 2020-01-07 ENCOUNTER — Other Ambulatory Visit
Admission: RE | Admit: 2020-01-07 | Discharge: 2020-01-07 | Disposition: A | Discharge status: Home / Self Care | Payer: Medicare HMO | Source: Ambulatory Visit | Attending: Specialist | Admitting: Specialist

## 2020-01-07 DIAGNOSIS — R600 Localized edema: Secondary | ICD-10-CM | POA: Insufficient documentation

## 2020-01-07 LAB — BRAIN NATRIURETIC PEPTIDE: B Natriuretic Peptide: 196 pg/mL — ABNORMAL HIGH (ref 0.0–100.0)

## 2020-01-10 NOTE — Progress Notes (Deleted)
[place]  [phone] [phone]) [phone]  ID: Jay Morris OB: [birth year]  MR#: [id number]  OIZ#:[id number]  Patient Care Team: [name], MD as PCP - General (Internal Medicine)  CHIEF COMPLAINT: Anemia, unspecified.  INTERVAL HISTORY: Patient returns to clinic today for repeat laboratory work, further evaluation, and initiation of Retacrit.  He continues to have chronic weakness and fatigue, but otherwise feels well.  He has no neurologic complaints.  He denies any recent fevers or illnesses.  He has a good appetite and denies weight loss.  He has no chest pain, shortness of breath, cough, or hemoptysis.  He denies any nausea, vomiting, constipation, or diarrhea.  He has no melena or hematochezia.  He has no urinary complaints.  Patient offers no further specific complaints today.  REVIEW OF SYSTEMS:   Review of Systems  Constitutional: Positive for malaise/fatigue. Negative for fever and weight loss.  Respiratory: Negative.  Negative for cough, hemoptysis and shortness of breath.   Cardiovascular: Negative.  Negative for chest pain and leg swelling.  Gastrointestinal: Negative.  Negative for abdominal pain, blood in stool and melena.  Genitourinary: Negative.  Negative for hematuria.  Musculoskeletal: Negative.  Negative for back pain.  Skin: Negative.  Negative for rash.  Neurological: Positive for weakness. Negative for dizziness, focal weakness and headaches.  Psychiatric/Behavioral: Negative.  The patient is not nervous/anxious.     As per HPI. Otherwise, a complete review of systems is negative.  PAST MEDICAL HISTORY: Past Medical History:  Diagnosis Date  . Asthma   . Atrial fibrillation ([place])   . BPH (benign prostatic hyperplasia)   . COPD (chronic obstructive pulmonary disease) ([place])   . Diabetes mellitus without complication ([place])   . Helicobacter pylori gastritis   . Hyperlipidemia   . Hypertension   . Pneumonia      PAST SURGICAL HISTORY: Past Surgical History:  Procedure Laterality Date  . CARDIAC ELECTROPHYSIOLOGY MAPPING AND ABLATION    . CARDIOVERSION N/A 06/22/2019   Procedure: CARDIOVERSION;  Surgeon: Fay Records, MD;  Location: [place];  Service: Cardiovascular;  Laterality: N/A;  . COLONOSCOPY WITH PROPOFOL N/A 06/15/2017   Procedure: COLONOSCOPY WITH PROPOFOL;  Surgeon: Toledo, Benay Pike, MD;  Location: [facility] ENDOSCOPY;  Service: Gastroenterology;  Laterality: N/A;  . ENTEROSCOPY N/A 11/24/2017   Procedure: ENTEROSCOPY;  Surgeon: Jonathon Bellows, MD;  Location: [facility] ENDOSCOPY;  Service: Gastroenterology;  Laterality: N/A;  . ESOPHAGOGASTRODUODENOSCOPY N/A 11/27/2017   Procedure: ESOPHAGOGASTRODUODENOSCOPY (EGD);  Surgeon: Lucilla Lame, MD;  Location: [facility] ENDOSCOPY;  Service: Endoscopy;  Laterality: N/A;  . ESOPHAGOGASTRODUODENOSCOPY (EGD) WITH PROPOFOL N/A 06/02/2017   Procedure: ESOPHAGOGASTRODUODENOSCOPY (EGD) WITH PROPOFOL;  Surgeon: Toledo, Benay Pike, MD;  Location: [facility] ENDOSCOPY;  Service: Gastroenterology;  Laterality: N/A;  . GIVENS CAPSULE STUDY N/A 11/25/2017   Procedure: GIVENS CAPSULE STUDY;  Surgeon: Jonathon Bellows, MD;  [facility];  Service: Gastroenterology;  Laterality: N/A;  . HAND SURGERY      FAMILY HISTORY: Family History  Problem Relation Age of Onset  . Heart disease Mother   . Cancer Father   . Cancer Paternal Grandfather     ADVANCED DIRECTIVES (Y/N):  N  HEALTH MAINTENANCE: Social History   Tobacco Use  . Smoking status: Former Smoker    Packs/day: 1.00    Years: 30.00    Pack years: 30.00    Types: Cigarettes    Quit date: 1995    Years since quitting: 26.4  . Smokeless tobacco: Never Used  Vaping Use  .  Vaping Use: Never used  Substance Use Topics  . Alcohol use: No    Alcohol/week: 0.0 standard drinks    Comment: occasional  . Drug use: No     Colonoscopy:  PAP:  Bone density:  Lipid panel:  Allergies  Allergen Reactions   . Shrimp [Shellfish Allergy] Anaphylaxis    Current Outpatient Medications  Medication Sig Dispense Refill  . acetaminophen (TYLENOL) 500 MG tablet Take 1,000 mg by mouth every 6 (six) hours as needed for moderate pain or headache.    . albuterol (VENTOLIN HFA) 108 (90 Base) MCG/ACT inhaler Inhale 2 puffs into the lungs daily.    [place] Kitchen allopurinol (ZYLOPRIM) 100 MG tablet Take 100 mg by mouth daily.    Mar[place] Kitchen apixaban (ELIQUIS) 5 MG TABS tablet Take 1 tablet (5 mg total) by mouth 2 (two) times daily. 60 tablet 0  . ascorbic acid (VITAMIN C) 500 MG tablet Take 500 mg by mouth daily.    [place] Kitchen atorvastatin (LIPITOR) 20 MG tablet Take 20 mg by mouth at bedtime.     Mar[place] Kitchen BREO ELLIPTA 100-25 MCG/INH AEPB Inhale 1 puff into the lungs daily.    . Cholecalciferol (DIALYVITE VITAMIN D 5000) 125 MCG (5000 UT) capsule Take 5,000 Units by mouth daily.    . colchicine 0.6 MG tablet Take 0.6 mg by mouth daily as needed (gout flare).    . finasteride (PROSCAR) 5 MG tablet Take 1 tablet (5 mg total) by mouth daily. 30 tablet 0  . furosemide (LASIX) 40 MG tablet Take 1 tablet (40 mg total) by mouth daily. (Patient not taking: Reported on 01/08/2020) 30 tablet 0  . furosemide (LASIX) 80 MG tablet Take 40 mg by mouth in the morning and at bedtime.    . insulin aspart (NOVOLOG) 100 UNIT/ML FlexPen Inject 14-26 Units into the skin 3 (three) times daily with meals.     . insulin degludec (TRESIBA FLEXTOUCH) 200 UNIT/ML FlexTouch Pen Inject 46 Units into the skin at bedtime.    . Insulin Pen Needle 31G X 8 MM MISC BD Ultra-Fine Short Pen Needle 31 gauge x 5/16"    . lisinopril (ZESTRIL) 20 MG tablet Take 10 mg by mouth at bedtime.     . Magnesium Oxide 400 (240 Mg) MG TABS Take 400 mg by mouth in the morning, at noon, and at bedtime.    . metFORMIN (GLUCOPHAGE) 1000 MG tablet Take 1,000 mg by mouth 2 (two) times daily.    . metoprolol tartrate (LOPRESSOR) 25 MG tablet Take 50 mg by mouth 2 (two) times daily.     . pantoprazole  (PROTONIX) 40 MG tablet Take 1 tablet (40 mg total) by mouth 2 (two) times daily before a meal. 60 tablet 0  . predniSONE (DELTASONE) 5 MG tablet Take 5 mg by mouth daily.    . sildenafil (REVATIO) 20 MG tablet Take 20 mg by mouth 3 (three) times daily.    . sodium chloride (OCEAN) 0.65 % SOLN nasal spray Place 1 spray into both nostrils as needed for congestion.    . tamsulosin (FLOMAX) 0.4 MG CAPS capsule Take 2 capsules (0.8 mg total) by mouth every evening. 180 capsule 0  . vitamin B-12 (CYANOCOBALAMIN) 1000 MCG tablet Take 1,000 mcg by mouth daily.     No current facility-administered medications for this visit.    OBJECTIVE: There were no vitals filed for this visit.   There is no height or weight on file to calculate BMI.    ECOG FS:1 -  Symptomatic but completely ambulatory  General: Well-developed, well-nourished, no acute distress.  Sitting in a wheelchair. Eyes: Pink conjunctiva, anicteric sclera. HEENT: Normocephalic, moist mucous membranes. Lungs: No audible wheezing or coughing. Heart: Regular rate and rhythm. Abdomen: Soft, nontender, no obvious distention. Musculoskeletal: No edema, cyanosis, or clubbing. Neuro: Alert, answering all questions appropriately. Cranial nerves grossly intact. Skin: No rashes or petechiae noted. Psych: Normal affect.  LAB RESULTS:  Lab Results  Component Value Date   NA 138 10/18/2019   K 4.2 10/18/2019   CL 95 (L) 10/18/2019   CO2 31 10/18/2019   GLUCOSE 228 (H) 10/18/2019   BUN 44 (H) 10/18/2019   CREATININE 1.89 (H) 10/18/2019   CALCIUM 9.7 10/18/2019   PROT 5.3 (L) 06/23/2019   ALBUMIN 2.1 (L) 06/23/2019   AST 27 06/23/2019   ALT 26 06/23/2019   ALKPHOS 31 (L) 06/23/2019   BILITOT 1.4 (H) 06/23/2019   GFRNONAA 34 (L) 10/18/2019   GFRAA 39 (L) 10/18/2019    Lab Results  Component Value Date   WBC 11.7 (H) 12/20/2019   NEUTROABS 9.0 (H) 12/20/2019   HGB 9.7 (L) 12/20/2019   HCT 30.4 (L) 12/20/2019   MCV 96.5 12/20/2019     PLT 207 12/20/2019     STUDIES: No results found.  ASSESSMENT: Anemia, unspecified.  PLAN:   1. Anemia, unspecified: Patient's hemoglobin continues to trend down and is now 8.2.  Previously, all of his other laboratory work including iron stores, B12, folate, hemolysis labs are all negative or within normal limits.  His reticulocyte count is inappropriately normal. [employer] myeloid panel is pending at time of dictation.  His erythropoietin level is only mildly elevated, but still inappropriately low for this level of anemia.  Given his longstanding chronic renal insufficiency, he will benefit from 40,000 units of Retacrit.  Proceed with treatment today.  Return to clinic in 1, 2, and 3 months for laboratory work and Retacrit if his hemoglobin remains below 10.0.  Patient would then return to clinic in 4 months for repeat laboratory work, further evaluation, and continuation of treatment. 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  This is likely clinically insignificant and unrelated to his anemia.  Continue to monitor and repeat in 6 months.  I spent a total of 30 minutes reviewing chart data, face-to-face evaluation with the patient, counseling and coordination of care as detailed above.   Patient expressed understanding and was in agreement with this plan. He also understands that He can call clinic at any time with any questions, concerns, or complaints.    Lloyd Huger, MD   01/10/2020 3:41 PM

## 2020-01-14 ENCOUNTER — Telehealth: Payer: Self-pay | Admitting: *Deleted

## 2020-01-14 NOTE — Telephone Encounter (Signed)
Per Shella Spearing pts wife called to cx 01/17/20 appts and stated that she would call back to have them R/S at a later date.

## 2020-01-15 ENCOUNTER — Other Ambulatory Visit: Payer: Self-pay

## 2020-01-15 ENCOUNTER — Other Ambulatory Visit
Admission: RE | Admit: 2020-01-15 | Discharge: 2020-01-15 | Disposition: A | Discharge status: Home / Self Care | Payer: Medicare HMO | Source: Ambulatory Visit | Attending: Internal Medicine | Admitting: Internal Medicine

## 2020-01-15 DIAGNOSIS — Z20822 Contact with and (suspected) exposure to covid-19: Secondary | ICD-10-CM | POA: Diagnosis not present

## 2020-01-15 DIAGNOSIS — Z01812 Encounter for preprocedural laboratory examination: Secondary | ICD-10-CM | POA: Insufficient documentation

## 2020-01-15 LAB — SARS CORONAVIRUS 2 (TAT 6-24 HRS): SARS Coronavirus 2: NEGATIVE

## 2020-01-16 DIAGNOSIS — I5023 Acute on chronic systolic (congestive) heart failure: Secondary | ICD-10-CM

## 2020-01-17 ENCOUNTER — Inpatient Hospital Stay: Payer: Medicare HMO

## 2020-01-17 ENCOUNTER — Encounter
Admission: RE | Disposition: A | Discharge status: Home / Self Care | Payer: Self-pay | Source: Home / Self Care | Attending: Internal Medicine

## 2020-01-17 ENCOUNTER — Ambulatory Visit
Admission: RE | Admit: 2020-01-17 | Discharge: 2020-01-17 | Disposition: A | Discharge status: Home / Self Care | Payer: Medicare HMO | Attending: Internal Medicine | Admitting: Internal Medicine

## 2020-01-17 ENCOUNTER — Inpatient Hospital Stay: Payer: Medicare HMO | Admitting: Oncology

## 2020-01-17 ENCOUNTER — Encounter: Payer: Self-pay | Admitting: Internal Medicine

## 2020-01-17 ENCOUNTER — Other Ambulatory Visit: Payer: Self-pay

## 2020-01-17 DIAGNOSIS — Z79899 Other long term (current) drug therapy: Secondary | ICD-10-CM | POA: Insufficient documentation

## 2020-01-17 DIAGNOSIS — I700 Atherosclerosis of aorta: Secondary | ICD-10-CM | POA: Diagnosis not present

## 2020-01-17 DIAGNOSIS — Z8616 Personal history of COVID-19: Secondary | ICD-10-CM | POA: Diagnosis not present

## 2020-01-17 DIAGNOSIS — N183 Chronic kidney disease, stage 3 unspecified: Secondary | ICD-10-CM | POA: Insufficient documentation

## 2020-01-17 DIAGNOSIS — I5023 Acute on chronic systolic (congestive) heart failure: Secondary | ICD-10-CM

## 2020-01-17 DIAGNOSIS — I480 Paroxysmal atrial fibrillation: Secondary | ICD-10-CM | POA: Insufficient documentation

## 2020-01-17 DIAGNOSIS — J449 Chronic obstructive pulmonary disease, unspecified: Secondary | ICD-10-CM | POA: Insufficient documentation

## 2020-01-17 DIAGNOSIS — I509 Heart failure, unspecified: Secondary | ICD-10-CM

## 2020-01-17 DIAGNOSIS — Z794 Long term (current) use of insulin: Secondary | ICD-10-CM | POA: Insufficient documentation

## 2020-01-17 DIAGNOSIS — I130 Hypertensive heart and chronic kidney disease with heart failure and stage 1 through stage 4 chronic kidney disease, or unspecified chronic kidney disease: Secondary | ICD-10-CM | POA: Diagnosis not present

## 2020-01-17 DIAGNOSIS — Z87891 Personal history of nicotine dependence: Secondary | ICD-10-CM | POA: Insufficient documentation

## 2020-01-17 DIAGNOSIS — I5033 Acute on chronic diastolic (congestive) heart failure: Secondary | ICD-10-CM | POA: Insufficient documentation

## 2020-01-17 DIAGNOSIS — I2510 Atherosclerotic heart disease of native coronary artery without angina pectoris: Secondary | ICD-10-CM

## 2020-01-17 DIAGNOSIS — Z7901 Long term (current) use of anticoagulants: Secondary | ICD-10-CM | POA: Insufficient documentation

## 2020-01-17 DIAGNOSIS — E785 Hyperlipidemia, unspecified: Secondary | ICD-10-CM | POA: Diagnosis not present

## 2020-01-17 DIAGNOSIS — I272 Pulmonary hypertension, unspecified: Secondary | ICD-10-CM | POA: Diagnosis not present

## 2020-01-17 HISTORY — DX: Atherosclerotic heart disease of native coronary artery without angina pectoris: I25.10

## 2020-01-17 HISTORY — PX: RIGHT/LEFT HEART CATH AND CORONARY ANGIOGRAPHY: CATH118266

## 2020-01-17 HISTORY — DX: Right heart failure, unspecified: I50.810

## 2020-01-17 LAB — GLUCOSE, CAPILLARY
Glucose-Capillary: 237 mg/dL — ABNORMAL HIGH (ref 70–99)
Glucose-Capillary: 227 mg/dL — ABNORMAL HIGH (ref 70–99)

## 2020-01-17 SURGERY — RIGHT/LEFT HEART CATH AND CORONARY ANGIOGRAPHY
Anesthesia: Moderate Sedation

## 2020-01-17 MED ORDER — IOHEXOL 300 MG/ML  SOLN
INTRAMUSCULAR | Status: DC | PRN
  Administered 2020-01-17: 110 mL

## 2020-01-17 MED ORDER — ONDANSETRON HCL 4 MG/2ML IJ SOLN: 4.0000 mg | Freq: Four times a day (QID) | INTRAMUSCULAR | Status: DC | PRN

## 2020-01-17 MED ORDER — SODIUM CHLORIDE 0.9% FLUSH: 3.0000 mL | INTRAVENOUS | Status: DC | PRN

## 2020-01-17 MED ORDER — MIDAZOLAM HCL 2 MG/2ML IJ SOLN
INTRAMUSCULAR | Status: AC
  Filled 2020-01-17: qty 2

## 2020-01-17 MED ORDER — ACETAMINOPHEN 325 MG PO TABS: 650.0000 mg | ORAL_TABLET | ORAL | Status: DC | PRN

## 2020-01-17 MED ORDER — SODIUM CHLORIDE 0.9 % WEIGHT BASED INFUSION
3.0000 mL/kg/h | INTRAVENOUS | Status: AC
  Administered 2020-01-17: 3 mL/kg/h via INTRAVENOUS

## 2020-01-17 MED ORDER — LIDOCAINE HCL (PF) 1 % IJ SOLN
INTRAMUSCULAR | Status: DC | PRN
  Administered 2020-01-17: 30 mL via SUBCUTANEOUS

## 2020-01-17 MED ORDER — HYDRALAZINE HCL 20 MG/ML IJ SOLN: 10.0000 mg | INTRAMUSCULAR | Status: DC | PRN

## 2020-01-17 MED ORDER — FENTANYL CITRATE (PF) 100 MCG/2ML IJ SOLN
INTRAMUSCULAR | Status: DC | PRN
  Administered 2020-01-17: 25 ug via INTRAVENOUS

## 2020-01-17 MED ORDER — LABETALOL HCL 5 MG/ML IV SOLN: 10.0000 mg | INTRAVENOUS | Status: DC | PRN

## 2020-01-17 MED ORDER — SODIUM CHLORIDE 0.9 % WEIGHT BASED INFUSION
1.0000 mL/kg/h | INTRAVENOUS | Status: DC
  Administered 2020-01-17: 1 mL/kg/h via INTRAVENOUS

## 2020-01-17 MED ORDER — HEPARIN (PORCINE) IN NACL 1000-0.9 UT/500ML-% IV SOLN
INTRAVENOUS | Status: AC
  Filled 2020-01-17: qty 1000

## 2020-01-17 MED ORDER — ASPIRIN 81 MG PO CHEW: 81.0000 mg | CHEWABLE_TABLET | ORAL | Status: AC

## 2020-01-17 MED ORDER — MIDAZOLAM HCL 2 MG/2ML IJ SOLN
INTRAMUSCULAR | Status: DC | PRN
  Administered 2020-01-17: 1 mg via INTRAVENOUS

## 2020-01-17 MED ORDER — ASPIRIN 81 MG PO CHEW
CHEWABLE_TABLET | ORAL | Status: AC
  Filled 2020-01-17: qty 1

## 2020-01-17 MED ORDER — LIDOCAINE HCL (PF) 1 % IJ SOLN
INTRAMUSCULAR | Status: AC
  Filled 2020-01-17: qty 30

## 2020-01-17 MED ORDER — HEPARIN (PORCINE) IN NACL 1000-0.9 UT/500ML-% IV SOLN
INTRAVENOUS | Status: DC | PRN
  Administered 2020-01-17: 500 mL

## 2020-01-17 MED ORDER — SODIUM CHLORIDE 0.9 % IV SOLN: 250.0000 mL | INTRAVENOUS | Status: DC | PRN

## 2020-01-17 MED ORDER — ASPIRIN 81 MG PO CHEW
CHEWABLE_TABLET | ORAL | Status: AC
  Administered 2020-01-17: 81 mg via ORAL
  Filled 2020-01-17: qty 1

## 2020-01-17 MED ORDER — SODIUM CHLORIDE 0.9% FLUSH: 3.0000 mL | Freq: Two times a day (BID) | INTRAVENOUS | Status: DC

## 2020-01-17 MED ORDER — FENTANYL CITRATE (PF) 100 MCG/2ML IJ SOLN
INTRAMUSCULAR | Status: AC
  Filled 2020-01-17: qty 2

## 2020-01-17 MED ORDER — SODIUM CHLORIDE 0.9 % WEIGHT BASED INFUSION: 1.0000 mL/kg/h | INTRAVENOUS | Status: DC

## 2020-01-17 SURGICAL SUPPLY — 12 items
CATH INFINITI 5FR ANG PIGTAIL (CATHETERS) ×1 IMPLANT
CATH INFINITI 5FR JL4 (CATHETERS) ×1 IMPLANT
CATH INFINITI JR4 5F (CATHETERS) ×1 IMPLANT
CATH SWANZ 7F THERMO (CATHETERS) ×1 IMPLANT
DEVICE CLOSURE MYNXGRIP 5F (Vascular Products) ×1 IMPLANT
KIT MANI 3VAL PERCEP (MISCELLANEOUS) ×2 IMPLANT
NDL PERC 18GX7CM (NEEDLE) IMPLANT
NEEDLE PERC 18GX7CM (NEEDLE) ×2 IMPLANT
PACK CARDIAC CATH (CUSTOM PROCEDURE TRAY) ×2 IMPLANT
SHEATH AVANTI 5FR X 11CM (SHEATH) ×1 IMPLANT
SHEATH AVANTI 7FRX11 (SHEATH) ×1 IMPLANT
WIRE GUIDERIGHT .035X150 (WIRE) ×1 IMPLANT

## 2020-01-17 NOTE — Progress Notes (Signed)
Medication reconciliation not accurate. Patient informed per Dr. Nehemiah Massed to restart eliquis in 2 days and start aspirin 81 mg daily tomorrow morning. Patient and patient's wife educated on medication changes and both verbalized understanding. In addition, information was handwritten on patient's AVS. No further questions.

## 2020-01-17 NOTE — Discharge Instructions (Signed)
Femoral Site Care This sheet gives you information about how to care for yourself after your procedure. Your health care provider may also give you more specific instructions. If you have problems or questions, contact your health care provider. What can I expect after the procedure? After the procedure, it is common to have:  Bruising that usually fades within 1-2 weeks.  Tenderness at the site. Follow these instructions at home: Wound care  Follow instructions from your health care provider about how to take care of your insertion site. Make sure you: ? Wash your hands with soap and water before you change your bandage (dressing). If soap and water are not available, use hand sanitizer. ? Change your dressing as told by your health care provider. ? Leave stitches (sutures), skin glue, or adhesive strips in place. These skin closures may need to stay in place for 2 weeks or longer. If adhesive strip edges start to loosen and curl up, you may trim the loose edges. Do not remove adhesive strips completely unless your health care provider tells you to do that.  Do not take baths, swim, or use a hot tub until your health care provider approves.  You may shower 24-48 hours after the procedure or as told by your health care provider. ? Gently wash the site with plain soap and water. ? Pat the area dry with a clean towel. ? Do not rub the site. This may cause bleeding.  Do not apply powder or lotion to the site. Keep the site clean and dry.  Check your femoral site every day for signs of infection. Check for: ? Redness, swelling, or pain. ? Fluid or blood. ? Warmth. ? Pus or a bad smell. Activity  For the first 2-3 days after your procedure, or as long as directed: ? Avoid climbing stairs as much as possible. ? Do not squat.  Do not lift anything that is heavier than 10 lb (4.5 kg), or the limit that you are told, until your health care provider says that it is safe.  Rest as  directed. ? Avoid sitting for a long time without moving. Get up to take short walks every 1-2 hours.  Do not drive for 24 hours if you were given a medicine to help you relax (sedative). General instructions  Take over-the-counter and prescription medicines only as told by your health care provider.  Keep all follow-up visits as told by your health care provider. This is important. Contact a health care provider if you have:  A fever or chills.  You have redness, swelling, or pain around your insertion site. Get help right away if:  The catheter insertion area swells very fast.  You pass out.  You suddenly start to sweat or your skin gets clammy.  The catheter insertion area is bleeding, and the bleeding does not stop when you hold steady pressure on the area.  The area near or just beyond the catheter insertion site becomes pale, cool, tingly, or numb. These symptoms may represent a serious problem that is an emergency. Do not wait to see if the symptoms will go away. Get medical help right away. Call your local emergency services (911 in the [place] Do not drive yourself to the hospital. Summary  After the procedure, it is common to have bruising that usually fades within 1-2 weeks.  Check your femoral site every day for signs of infection.  Do not lift anything that is heavier than 10 lb (4.5 kg), or the  limit that you are told, until your health care provider says that it is safe. This information is not intended to replace advice given to you by your health care provider. Make sure you discuss any questions you have with your health care provider. Document Revised: 07/18/2017 Document Reviewed: 07/18/2017 Elsevier Patient Education  [place].    Moderate Conscious Sedation, Adult, Care After These instructions provide you with information about caring for yourself after your procedure. Your health care provider may also give you more specific instructions. Your  treatment has been planned according to current medical practices, but problems sometimes occur. Call your health care provider if you have any problems or questions after your procedure. What can I expect after the procedure? After your procedure, it is common:  To feel sleepy for several hours.  To feel clumsy and have poor balance for several hours.  To have poor judgment for several hours.  To vomit if you eat too soon. Follow these instructions at home: For at least 24 hours after the procedure:   Do not: ? Participate in activities where you could fall or become injured. ? Drive. ? Use heavy machinery. ? Drink alcohol. ? Take sleeping pills or medicines that cause drowsiness. ? Make important decisions or sign legal documents. ? Take care of children on your own.  Rest. Eating and drinking  Follow the diet recommended by your health care provider.  If you vomit: ? Drink water, juice, or soup when you can drink without vomiting. ? Make sure you have little or no nausea before eating solid foods. General instructions  Have a responsible adult stay with you until you are awake and alert.  Take over-the-counter and prescription medicines only as told by your health care provider.  If you smoke, do not smoke without supervision.  Keep all follow-up visits as told by your health care provider. This is important. Contact a health care provider if:  You keep feeling nauseous or you keep vomiting.  You feel light-headed.  You develop a rash.  You have a fever. Get help right away if:  You have trouble breathing. This information is not intended to replace advice given to you by your health care provider. Make sure you discuss any questions you have with your health care provider. Document Revised: 06/17/2017 Document Reviewed: 10/25/2015 Elsevier Patient Education  2020 [employer].

## 2020-01-18 ENCOUNTER — Encounter: Payer: Self-pay | Admitting: Internal Medicine

## 2020-01-21 ENCOUNTER — Other Ambulatory Visit: Payer: Self-pay | Admitting: Physician Assistant

## 2020-01-21 DIAGNOSIS — N401 Enlarged prostate with lower urinary tract symptoms: Secondary | ICD-10-CM

## 2020-01-24 ENCOUNTER — Ambulatory Visit: Payer: Self-pay | Admitting: Urology

## 2020-01-28 ENCOUNTER — Other Ambulatory Visit: Payer: Self-pay

## 2020-01-28 ENCOUNTER — Ambulatory Visit (INDEPENDENT_AMBULATORY_CARE_PROVIDER_SITE_OTHER): Payer: Medicare HMO | Admitting: Physician Assistant

## 2020-01-28 ENCOUNTER — Encounter: Payer: Self-pay | Admitting: Physician Assistant

## 2020-01-28 VITALS — BP 133/71 | HR 64 | Ht 66.0 in | Wt 204.0 lb

## 2020-01-28 DIAGNOSIS — N183 Chronic kidney disease, stage 3 unspecified: Secondary | ICD-10-CM | POA: Insufficient documentation

## 2020-01-28 DIAGNOSIS — N401 Enlarged prostate with lower urinary tract symptoms: Secondary | ICD-10-CM | POA: Diagnosis not present

## 2020-01-28 DIAGNOSIS — R338 Other retention of urine: Secondary | ICD-10-CM

## 2020-01-28 LAB — BLADDER SCAN AMB NON-IMAGING

## 2020-01-28 NOTE — Patient Instructions (Addendum)
Start self-cathing twice a day, I recommend first thing in the morning and at the end of the day. Continue keeping track of how much urine you are getting out when you cath. If you start getting more than 327m of urine out every time you cath, then increase the frequency of catheterization to three times daily (for example, morning, noon, and night).  You should continue to urinate on your own as best you can.  I will adjust your catheter supply order so that you have enough.  Continue daily Flomax and finasteride.

## 2020-01-28 NOTE — Progress Notes (Addendum)
01/28/2020 5:18 PM   Jay Morris 1946/01/21 [id number]  CC: Chief Complaint  Patient presents with  . Follow-up    HPI: Jay Morris is a 74 y.o. male with BPH on Flomax and finasteride and urinary retention following hospitalization with COVID-19 pneumonia previously trained to CIC who presents today for follow up with PVR. At his last visit with me on 10/25/2019, he reported not self-cathing as instructed. PVR was elevated at >558m. He reported no abdominal discomfort associated with this. He was encouraged to resume daily CIC at that time.  Today, patient reports he has been self-cathing once daily as recommended. He brings a record of his output with him today, most cathed voids measuring 350-7067m He continues to void spontaneously approximately 3 times daily, but notes he has lost sensation of voiding. He denies urinary leakage. She is still taking Flomax and finasteride daily.  Per chart review, patient's creatinine has been slowly uptrending since his last visit with me, most recently 2.2 on 01/24/2020.  PVR 42338m PMH: Past Medical History:  Diagnosis Date  . Asthma   . Atrial fibrillation ([place] . BPH (benign prostatic hyperplasia)   . COPD (chronic obstructive pulmonary disease) ([place]CC[place] . Diabetes mellitus without complication (HCCR[place] . Helicobacter pylori gastritis   . Hyperlipidemia   . Hypertension   . Pneumonia   . [facility]   Surgical History: Past Surgical History:  Procedure Laterality Date  . CARDIAC ELECTROPHYSIOLOGY MAPPING AND ABLATION    . CARDIOVERSION N/A 06/22/2019   Procedure: CARDIOVERSION;  Surgeon: RosFay RecordsD;  [facility];  Laterality: N/A;  . COLONOSCOPY WITH PROPOFOL N/A 06/15/2017   Procedure: COLONOSCOPY WITH PROPOFOL;  Surgeon: Toledo, TeoBenay PikeD;  Location: [facility] ENDOSCOPY;  Service: Gastroenterology;  Laterality: N/A;  . ENTEROSCOPY N/A 11/24/2017   Procedure:  ENTEROSCOPY;  Surgeon: AnnJonathon BellowsD;  [facility];  Service: Gastroenterology;  Laterality: N/A;  . ESOPHAGOGASTRODUODENOSCOPY N/A 11/27/2017   Procedure: ESOPHAGOGASTRODUODENOSCOPY (EGD);  Surgeon: WohLucilla LameD;  [facility];  Service: Endoscopy;  Laterality: N/A;  . ESOPHAGOGASTRODUODENOSCOPY (EGD) WITH PROPOFOL N/A 06/02/2017   Procedure: ESOPHAGOGASTRODUODENOSCOPY (EGD) WITH PROPOFOL;  Surgeon: Toledo, TeoBenay PikeD;  Location: [facility] ENDOSCOPY;  Service: Gastroenterology;  Laterality: N/A;  . GIVENS CAPSULE STUDY N/A 11/25/2017   Procedure: GIVENS CAPSULE STUDY;  Surgeon: AnnJonathon BellowsD;  [facility];  Service: Gastroenterology;  Laterality: N/A;  . HAND SURGERY    . RIGHT/LEFT HEART CATH AND CORONARY ANGIOGRAPHY N/A 01/17/2020   Procedure: RIGHT/LEFT HEART CATH AND CORONARY ANGIOGRAPHY;  Surgeon: KowCorey SkainsD;  [facility];  Service: Cardiovascular;  Laterality: N/A;    Home Medications:  Allergies as of 01/28/2020      Reactions   Shrimp [shellfish Allergy] Anaphylaxis      Medication List       Accurate as of January 28, 2020  5:18 PM. If you have any questions, ask your nurse or doctor.        acetaminophen 500 MG tablet Commonly known as: TYLENOL Take 1,000 mg by mouth every 6 (six) hours as needed for moderate pain or headache.   albuterol 108 (90 Base) MCG/ACT inhaler Commonly known as: VENTOLIN HFA Inhale 2 puffs into the lungs daily.   allopurinol 100 MG tablet Commonly known as: ZYLOPRIM Take 100 mg by mouth daily.   ascorbic acid 500 MG tablet Commonly known as:  VITAMIN C Take 500 mg by mouth daily.   aspirin 81 MG chewable tablet Chew 81 mg by mouth daily.   atorvastatin 20 MG tablet Commonly known as: LIPITOR Take 20 mg by mouth at bedtime.   Breo Ellipta 100-25 MCG/INH Aepb Generic drug: fluticasone furoate-vilanterol Inhale 1 puff into the lungs daily.   colchicine 0.6 MG  tablet Take 0.6 mg by mouth daily as needed (gout flare).   Dialyvite Vitamin D 5000 125 MCG (5000 UT) capsule Generic drug: Cholecalciferol Take 5,000 Units by mouth daily.   Eliquis 5 MG Tabs tablet Generic drug: apixaban SMARTSIG:1 Tablet(s) By Mouth Every 12 Hours   finasteride 5 MG tablet Commonly known as: PROSCAR Take 1 tablet (5 mg total) by mouth daily.   furosemide 80 MG tablet Commonly known as: LASIX Take 80 mg by mouth daily. What changed: Another medication with the same name was removed. Continue taking this medication, and follow the directions you see here. Changed by: Debroah Loop, PA-C   insulin aspart 100 UNIT/ML FlexPen Commonly known as: NOVOLOG Inject 14-26 Units into the skin 3 (three) times daily with meals.   Insulin Pen Needle 31G X 8 MM Misc BD Ultra-Fine Short Pen Needle 31 gauge x 5/16"   lisinopril 20 MG tablet Commonly known as: ZESTRIL Take 10 mg by mouth at bedtime.   Magnesium Oxide 400 (240 Mg) MG Tabs Take 400 mg by mouth in the morning, at noon, and at bedtime.   metFORMIN 1000 MG tablet Commonly known as: GLUCOPHAGE Take 1,000 mg by mouth 2 (two) times daily.   metoprolol tartrate 25 MG tablet Commonly known as: LOPRESSOR Take 50 mg by mouth 2 (two) times daily.   pantoprazole 40 MG tablet Commonly known as: PROTONIX Take 1 tablet (40 mg total) by mouth 2 (two) times daily before a meal.   predniSONE 5 MG tablet Commonly known as: DELTASONE Take 5 mg by mouth daily.   sildenafil 20 MG tablet Commonly known as: REVATIO Take 20 mg by mouth 3 (three) times daily.   sodium chloride 0.65 % Soln nasal spray Commonly known as: OCEAN Place 1 spray into both nostrils as needed for congestion.   tamsulosin 0.4 MG Caps capsule Commonly known as: FLOMAX TAKE 2 CAPSULES EVERY EVENING   Tresiba FlexTouch 200 UNIT/ML FlexTouch Pen Generic drug: insulin degludec Inject 46 Units into the skin at bedtime.   vitamin B-12  1000 MCG tablet Commonly known as: CYANOCOBALAMIN Take 1,000 mcg by mouth daily.       Allergies:  Allergies  Allergen Reactions  . Shrimp [Shellfish Allergy] Anaphylaxis    Family History: Family History  Problem Relation Age of Onset  . Heart disease Mother   . Cancer Father   . Cancer Paternal Grandfather     Social History:   reports that he quit smoking about 26 years ago. His smoking use included cigarettes. He has a 30.00 pack-year smoking history. He has never used smokeless tobacco. He reports that he does not drink alcohol and does not use drugs.  Physical Exam: BP 133/71 (BP Location: Left Arm, Patient Position: Sitting, Cuff Size: Normal)   Pulse 64   Ht _0  (1.676 m)   Wt 204 lb (92.5 kg)   BMI 32.93 kg/m   Constitutional:  Alert and oriented, no acute distress, nontoxic appearing HEENT: [place], AT Cardiovascular: No clubbing, cyanosis, or edema Respiratory: On supplemental oxygen Skin: No rashes, bruises or suspicious lesions Neurologic: Grossly intact, no focal deficits, moving  all 4 extremities Psychiatric: Normal mood and affect  Laboratory Data: Results for orders placed or performed in visit on 01/28/20  Bladder Scan (Post Void Residual) in office  Result Value Ref Range   Scan Result 412m    Assessment & Plan:   1. Benign prostatic hyperplasia with urinary retention PVR remains elevated on daily CIC with high urinary outputs with self-catheterization.  I offered him continued CIC versus Foley catheter placement for management of his chronic retention.  He elected to continue CIC at this time.  Counseled patient to continue Flomax and finasteride and increased frequency of self-catheterization to twice daily.  Counseled him to continue monitoring residual volumes with self-catheterization and increase the frequency further if volume exceeds 350 mL.  He expressed understanding.  Will adjust catheter supply order to reflect frequency change. - Bladder  Scan (Post Void Residual) in office  Addendum: Due to BPH with urinary retention, he is to increase frequency of self-catheterization to twice daily and increase it further if bladder volume exceeds 350 mL.  He is to use coud tip catheters to advance past his enlarged prostate and decrease his risk for infection and prostatic trauma due to false passage with straight tipped catheters. SDebroah Loop PA-C 02/15/20 3:05 PM   Return in about 6 months (around 07/30/2020) for Chronic retention f/u with PVR.  SDebroah Loop PA-C  [facility] [address] [place]  [phone]

## 2020-02-11 NOTE — Progress Notes (Deleted)
[place]  [phone] [phone]  ID: [name] OB: 12-06-1945  MR#: [id number]  AOZ#:[id number]  Patient Care Team: [name], MD as PCP - General (Internal Medicine)  CHIEF COMPLAINT: Anemia, unspecified.  INTERVAL HISTORY: Patient returns to clinic today for repeat laboratory work, further evaluation, and initiation of Retacrit.  He continues to have chronic weakness and fatigue, but otherwise feels well.  He has no neurologic complaints.  He denies any recent fevers or illnesses.  He has a good appetite and denies weight loss.  He has no chest pain, shortness of breath, cough, or hemoptysis.  He denies any nausea, vomiting, constipation, or diarrhea.  He has no melena or hematochezia.  He has no urinary complaints.  Patient offers no further specific complaints today.  REVIEW OF SYSTEMS:   Review of Systems  Constitutional: Positive for malaise/fatigue. Negative for fever and weight loss.  Respiratory: Negative.  Negative for cough, hemoptysis and shortness of breath.   Cardiovascular: Negative.  Negative for chest pain and leg swelling.  Gastrointestinal: Negative.  Negative for abdominal pain, blood in stool and melena.  Genitourinary: Negative.  Negative for hematuria.  Musculoskeletal: Negative.  Negative for back pain.  Skin: Negative.  Negative for rash.  Neurological: Positive for weakness. Negative for dizziness, focal weakness and headaches.  Psychiatric/Behavioral: Negative.  The patient is not nervous/anxious.     As per HPI. Otherwise, a complete review of systems is negative.  PAST MEDICAL HISTORY: Past Medical History:  Diagnosis Date  . Asthma   . Atrial fibrillation ([place])   . BPH (benign prostatic hyperplasia)   . COPD (chronic obstructive pulmonary disease) ([place])   . Diabetes mellitus without complication ([place])   . Helicobacter pylori gastritis   . Hyperlipidemia   . Hypertension   . Pneumonia   .  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 CARDIAC ELECTROPHYSIOLOGY MAPPING AND ABLATION    . CARDIOVERSION N/A 06/22/2019   Procedure: CARDIOVERSION;  Surgeon: Fay Records, MD;  Location: [place];  Service: Cardiovascular;  Laterality: N/A;  . COLONOSCOPY WITH PROPOFOL N/A 06/15/2017   Procedure: COLONOSCOPY WITH PROPOFOL;  Surgeon: Toledo, Benay Pike, MD;  Location: [facility] ENDOSCOPY;  Service: Gastroenterology;  Laterality: N/A;  . ENTEROSCOPY N/A 11/24/2017   Procedure: ENTEROSCOPY;  Surgeon: Jonathon Bellows, MD;  [facility];  Service: Gastroenterology;  Laterality: N/A;  . ESOPHAGOGASTRODUODENOSCOPY N/A 11/27/2017   Procedure: ESOPHAGOGASTRODUODENOSCOPY (EGD);  Surgeon: Lucilla Lame, MD;  [facility];  Service: Endoscopy;  Laterality: N/A;  . ESOPHAGOGASTRODUODENOSCOPY (EGD) WITH PROPOFOL N/A 06/02/2017   Procedure: ESOPHAGOGASTRODUODENOSCOPY (EGD) WITH PROPOFOL;  Surgeon: Toledo, Benay Pike, MD;  Location: [facility] ENDOSCOPY;  Service: Gastroenterology;  Laterality: N/A;  . GIVENS CAPSULE STUDY N/A 11/25/2017   Procedure: GIVENS CAPSULE STUDY;  Surgeon: Jonathon Bellows, MD;  Location: [facility] END[facility]COPY;  Service: Gastroenterology;  Laterality: N/A;  . HAND SURGERY    . RIGHT/LEFT HEART CATH AND CORONARY ANGIOGRAPHY N/A 01/17/2020   Procedure: RIGHT/LEFT HEART CATH AND CORONARY ANGIOGRAPHY;  Surgeon: Corey Skains, MD;  Location: [place] CV LAB;  Service: Cardiovascular;  Laterality: N/A;    FAMILY HISTORY: Family History  Problem Relation Age of Onset  . Heart disease Mother   . Cancer Father   . Cancer Paternal Grandfather     ADVANCED DIRECTIVES (Y/N):  N  HEALTH MAINTENANCE: Social History   Tobacco Use  . Smoking status: Former  Smoker    Packs/day: 1.00    Years: 30.00    Pack years: 30.00    Types: Cigarettes    Quit date: 55    Years since quitting: 26.5  . Smokeless tobacco:  Never Used  Vaping Use  . Vaping Use: Never used  Substance Use Topics  . Alcohol use: No    Alcohol/week: 0.0 standard drinks    Comment: occasional  . Drug use: No     Colonoscopy:  PAP:  Bone density:  Lipid panel:  Allergies  Allergen Reactions  . Shrimp [Shellfish Allergy] Anaphylaxis    Current Outpatient Medications  Medication Sig Dispense Refill  . acetaminophen (TYLENOL) 500 MG tablet Take 1,000 mg by mouth every 6 (six) hours as needed for moderate pain or headache.    . albuterol (VENTOLIN HFA) 108 (90 Base) MCG/ACT inhaler Inhale 2 puffs into the lungs daily.    Marland Kitchen allopurinol (ZYLOPRIM) 100 MG tablet Take 100 mg by mouth daily.    Marland Kitchen ascorbic acid (VITAMIN C) 500 MG tablet Take 500 mg by mouth daily.    Marland Kitchen aspirin 81 MG chewable tablet Chew 81 mg by mouth daily.    Marland Kitchen atorvastatin (LIPITOR) 20 MG tablet Take 20 mg by mouth at bedtime.     Marland Kitchen BREO ELLIPTA 100-25 MCG/INH AEPB Inhale 1 puff into the lungs daily.    . Cholecalciferol (DIALYVITE VITAMIN D 5000) 125 MCG (5000 UT) capsule Take 5,000 Units by mouth daily.    . colchicine 0.6 MG tablet Take 0.6 mg by mouth daily as needed (gout flare).    Marland Kitchen ELIQUIS 5 MG TABS tablet SMARTSIG:1 Tablet(s) By Mouth Every 12 Hours    . finasteride (PROSCAR) 5 MG tablet Take 1 tablet (5 mg total) by mouth daily. 30 tablet 0  . furosemide (LASIX) 80 MG tablet Take 80 mg by mouth daily.     . insulin aspart (NOVOLOG) 100 UNIT/ML FlexPen Inject 14-26 Units into the skin 3 (three) times daily with meals.     . insulin degludec (TRESIBA FLEXTOUCH) 200 UNIT/ML FlexTouch Pen Inject 46 Units into the skin at bedtime.    . Insulin Pen Needle 31G X 8 MM MISC BD Ultra-Fine Short Pen Needle 31 gauge x 5/16"    . lisinopril (ZESTRIL) 20 MG tablet Take 10 mg by mouth at bedtime.     . Magnesium Oxide 400 (240 Mg) MG TABS Take 400 mg by mouth in the morning, at noon, and at bedtime.    . metFORMIN (GLUCOPHAGE) 1000 MG tablet Take 1,000 mg by mouth  2 (two) times daily.    . metoprolol tartrate (LOPRESSOR) 25 MG tablet Take 50 mg by mouth 2 (two) times daily.     . pantoprazole (PROTONIX) 40 MG tablet Take 1 tablet (40 mg total) by mouth 2 (two) times daily before a meal. 60 tablet 0  . predniSONE (DELTASONE) 5 MG tablet Take 5 mg by mouth daily.    . sildenafil (REVATIO) 20 MG tablet Take 20 mg by mouth 3 (three) times daily.    . sodium chloride (OCEAN) 0.65 % SOLN nasal spray Place 1 spray into both nostrils as needed for congestion.    . tamsulosin (FLOMAX) 0.4 MG CAPS capsule TAKE 2 CAPSULES EVERY EVENING 180 capsule 0  . vitamin B-12 (CYANOCOBALAMIN) 1000 MCG tablet Take 1,000 mcg by mouth daily.     No current facility-administered medications for this visit.    OBJECTIVE: There were no vitals filed for  this visit.   There is no height or weight on file to calculate BMI.    ECOG FS:1 - Symptomatic but completely ambulatory  General: Well-developed, well-nourished, no acute distress.  Sitting in a wheelchair. Eyes: Pink conjunctiva, anicteric sclera. HEENT: Normocephalic, moist mucous membranes. Lungs: No audible wheezing or coughing. Heart: Regular rate and rhythm. Abdomen: Soft, nontender, no obvious distention. Musculoskeletal: No edema, cyanosis, or clubbing. Neuro: Alert, answering all questions appropriately. Cranial nerves grossly intact. Skin: No rashes or petechiae noted. Psych: Normal affect.  LAB RESULTS:  Lab Results  Component Value Date   NA 138 10/18/2019   K 4.2 10/18/2019   CL 95 (L) 10/18/2019   CO2 31 10/18/2019   GLUCOSE 228 (H) 10/18/2019   BUN 44 (H) 10/18/2019   CREATININE 1.89 (H) 10/18/2019   CALCIUM 9.7 10/18/2019   PROT 5.3 (L) 06/23/2019   ALBUMIN 2.1 (L) 06/23/2019   AST 27 06/23/2019   ALT 26 06/23/2019   ALKPHOS 31 (L) 06/23/2019   BILITOT 1.4 (H) 06/23/2019   GFRNONAA 34 (L) 10/18/2019   GFRAA 39 (L) 10/18/2019    Lab Results  Component Value Date   WBC 11.7 (H) 12/20/2019     NEUTROABS 9.0 (H) 12/20/2019   HGB 9.7 (L) 12/20/2019   HCT 30.4 (L) 12/20/2019   MCV 96.5 12/20/2019   PLT 207 12/20/2019     STUDIES: CARDIAC CATHETERIZATION  Result Date: 01/17/2020  Ost LAD lesion is 25% stenosed.  Ost RCA to Prox RCA lesion is 65% stenosed.  Prox LAD lesion is 45% stenosed.  Mid LAD lesion is 65% stenosed.  2nd Mrg lesion is 35% stenosed.  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  74 year old male with hypertension hyperlipidemia diabetes with severe progressive shortness of breath and pulmonary hypertension after significant Covid infection.  There was no evidence of myocardial infarction or elevated troponin during hospitalization Echocardiogram showing normal LV systolic function moderate right ventricular and right atrial pressure and with severe tricuspid regurgitation and pulmonary pressures of 4.8 m/s with a peak systolic pressure of 497 mm Cardiac catheterization with significant elevated pressures Pulmonary capillary wedge pressure of 25 mm Left ventricular end-diastolic pressure of 19 mm Pulmonary artery pressure of 77/30 with a mean of 52 mm RV pressures of 78/12 with a end-diastolic pressure of 25 mm Right atrial pressures with a mean of 13 mm Coronary artery atherosclerosis 65% ostial to proximal right coronary artery, 65% mid left anterior descending artery 45% proximal LAD 35% mid obtuse marginal 1 Assessment Patient with severe shortness of breath with severe pulmonary hypertension possibly secondary to Covid infection and pulmonary injury and normal LV systolic function with moderate three-vessel coronary atherosclerosis Plan Aggressive medical management of risk factor modification for further risk reduction of progression of coronary atherosclerosis including hypertension control, high intensity cholesterol therapy, and antiplatelet therapy Further treatment of severe pulmonary hypertension as per pulmonology No further cardiac  intervention at this time Further consideration of stress test evaluation if any concerns of possible myocardial ischemia in the future    ASSESSMENT: Anemia, unspecified.  PLAN:   1. Anemia, unspecified: Patient's hemoglobin continues to trend down and is now 8.2.  Previously, all of his other laboratory work including iron stores, B12, folate, hemolysis labs are all negative or within normal limits.  His reticulocyte count is inappropriately normal. [employer] myeloid panel is pending at time of dictation.  His erythropoietin level is only mildly elevated, but still inappropriately low for this level of anemia.  Given  his longstanding chronic renal insufficiency, he will benefit from 40,000 units of Retacrit.  Proceed with treatment today.  Return to clinic in 1, 2, and 3 months for laboratory work and Retacrit if his hemoglobin remains below 10.0.  Patient would then return to clinic in 4 months for repeat laboratory work, further evaluation, and continuation of treatment. 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  This is likely clinically insignificant and unrelated to his anemia.  Continue to monitor and repeat in 6 months.  I spent a total of 30 minutes reviewing chart data, face-to-face evaluation with the patient, counseling and coordination of care as detailed above.   Patient expressed understanding and was in agreement with this plan. He also understands that He can call clinic at any time with any questions, concerns, or complaints.    Lloyd Huger, MD   02/11/2020 11:27 PM

## 2020-02-13 ENCOUNTER — Telehealth: Payer: Self-pay

## 2020-02-13 NOTE — Telephone Encounter (Signed)
[facility] needs [phone] records for patient to fill script. Please contact to clarify

## 2020-02-14 NOTE — Telephone Encounter (Signed)
Reached out to [employer], waiting on fax about what documentation is needed.

## 2020-02-15 ENCOUNTER — Inpatient Hospital Stay: Payer: Medicare HMO

## 2020-02-15 ENCOUNTER — Other Ambulatory Visit: Payer: Self-pay

## 2020-02-15 ENCOUNTER — Inpatient Hospital Stay: Payer: Medicare HMO | Admitting: Oncology

## 2020-02-15 ENCOUNTER — Inpatient Hospital Stay: Payer: Medicare HMO | Attending: Oncology | Admitting: Oncology

## 2020-02-15 VITALS — BP 113/53 | HR 67 | Temp 97.4°F | Resp 18 | Wt 206.7 lb

## 2020-02-15 DIAGNOSIS — D649 Anemia, unspecified: Secondary | ICD-10-CM | POA: Diagnosis present

## 2020-02-15 DIAGNOSIS — N189 Chronic kidney disease, unspecified: Secondary | ICD-10-CM | POA: Insufficient documentation

## 2020-02-15 LAB — CBC WITH DIFFERENTIAL/PLATELET
Abs Immature Granulocytes: 0.03 10*3/uL (ref 0.00–0.07)
Basophils Absolute: 0.1 10*3/uL (ref 0.0–0.1)
Basophils Relative: 1 %
Eosinophils Absolute: 0.2 10*3/uL (ref 0.0–0.5)
Eosinophils Relative: 2 %
HCT: 25.1 % — ABNORMAL LOW (ref 39.0–52.0)
Hemoglobin: 8.1 g/dL — ABNORMAL LOW (ref 13.0–17.0)
Immature Granulocytes: 0 %
Lymphocytes Relative: 16 %
Lymphs Abs: 1.7 10*3/uL (ref 0.7–4.0)
MCH: 31.6 pg (ref 26.0–34.0)
MCHC: 32.3 g/dL (ref 30.0–36.0)
MCV: 98 fL (ref 80.0–100.0)
Monocytes Absolute: 1.2 10*3/uL — ABNORMAL HIGH (ref 0.1–1.0)
Monocytes Relative: 11 %
Neutro Abs: 7.5 10*3/uL (ref 1.7–7.7)
Neutrophils Relative %: 70 %
Platelets: 235 10*3/uL (ref 150–400)
RBC: 2.56 MIL/uL — ABNORMAL LOW (ref 4.22–5.81)
RDW: 16.7 % — ABNORMAL HIGH (ref 11.5–15.5)
WBC: 10.8 10*3/uL — ABNORMAL HIGH (ref 4.0–10.5)
nRBC: 0 % (ref 0.0–0.2)

## 2020-02-15 MED ORDER — EPOETIN ALFA-EPBX 40000 UNIT/ML IJ SOLN
40000.0000 [IU] | Freq: Once | INTRAMUSCULAR | Status: AC
  Administered 2020-02-15: 40000 [IU] via SUBCUTANEOUS
  Filled 2020-02-15: qty 1

## 2020-02-15 NOTE — Telephone Encounter (Signed)
Chart note request from [facility] faxed 7/30.

## 2020-02-17 NOTE — Progress Notes (Signed)
[facility]  [phone] Fax:(336) [phone]  ID: [name] OB: Jun 02, 1946  MR#: [id number]  YYT#:[id number]  Patient Care Team: [name], MD as PCP - General (Internal Medicine)  CHIEF COMPLAINT: Anemia, unspecified.  INTERVAL HISTORY: Patient returns to clinic today for repeat laboratory work, further evaluation, and continuation of Retacrit.  His hemoglobin initially increased to greater than 10.0, but then patient missed an injection secondary to chronic illness and his hemoglobin trended back down.  He continues to have chronic weakness and fatigue, but otherwise feels well.  He has no neurologic complaints.  He denies any recent fevers or illnesses.  He has a good appetite and denies weight loss.  He has no chest pain, shortness of breath, cough, or hemoptysis.  He denies any nausea, vomiting, constipation, or diarrhea.  He has no melena or hematochezia.  He has no urinary complaints.  Patient offers no further specific complaints today.  REVIEW OF SYSTEMS:   Review of Systems  Constitutional: Positive for malaise/fatigue. Negative for fever and weight loss.  Respiratory: Positive for shortness of breath. Negative for cough and hemoptysis.   Cardiovascular: Negative.  Negative for chest pain and leg swelling.  Gastrointestinal: Negative.  Negative for abdominal pain, blood in stool and melena.  Genitourinary: Negative.  Negative for hematuria.  Musculoskeletal: Negative.  Negative for back pain.  Skin: Negative.  Negative for rash.  Neurological: Positive for weakness. Negative for dizziness, focal weakness and headaches.  Psychiatric/Behavioral: Negative.  The patient is not nervous/anxious.     As per HPI. Otherwise, a complete review of systems is negative.  PAST MEDICAL HISTORY: Past Medical History:  Diagnosis Date  . Asthma   . Atrial fibrillation ([place])   . BPH (benign prostatic hyperplasia)   . COPD (chronic obstructive  pulmonary disease) ([place])   . Diabetes mellitus without complication ([place])   . Helicobacter pylori gastritis   . Hyperlipidemia   . Hypertension   . Pneumonia   . Right heart failure ([place])     PAST SURGICAL HISTORY: Past Surgical History:  Procedure Laterality Date  . CARDIAC ELECTROPHYSIOLOGY MAPPING AND ABLATION    . CARDIOVERSION N/A 06/22/2019   Procedure: CARDIOVERSION;  Surgeon: Fay Records, MD;  Location: [facility];  Service: Cardiovascular;  Laterality: N/A;  . COLONOSCOPY WITH PROPOFOL N/A 06/15/2017   Procedure: COLONOSCOPY WITH PROPOFOL;  Surgeon: Toledo, Benay Pike, MD;  Location: [facility] ENDOSCOPY;  Service: Gastroenterology;  Laterality: N/A;  . ENTEROSCOPY N/A 11/24/2017   Procedure: ENTEROSCOPY;  Surgeon: Jonathon Bellows, MD;  Location[facility] ENDOSCOPY;  Service: Gastroenterology;  Laterality: N/A;  . ESOPHAGOGASTRODUODENOSCOPY N/A 11/27/2017   Procedure: ESOPHAGOGASTRODUODENOSCOPY (EGD);  Surgeon: Lucilla Lame, MD;  Location: [facility] ENDOSCOPY;  Service: Endoscopy;  Laterality: N/A;  . ESOPHAGOGASTRODUODENOSCOPY (EGD) WITH PROPOFOL N/A 06/02/2017   Procedure: ESOPHAGOGASTRODUODENOSCOPY (EGD) WITH PROPOFOL;  Surgeon: Toledo, Benay Pike, MD;  Location: [facility] ENDOSCOPY;  Service: Gastroenterology;  Laterality: N/A;  . GIVENS CAPSULE STUDY N/A 11/25/2017   Procedure: GIVENS CAPSULE STUDY;  Surgeon: Jonathon Bellows, MD;  Location: [facility] ENDOSCOPY;  Service: Gastroenterology;  Laterality: N/A;  . HAND SURGERY    . RIGHT/LEFT HEART CATH AND CORONARY ANGIOGRAPHY N/A 01/17/2020   Procedure: RIGHT/LEFT HEART CATH AND CORONARY ANGIOGRAPHY;  Surgeon: Corey Skains, MD;  Location: [facility] LAB;  Service: Cardiovascular;  Laterality: N/A;    FAMILY HISTORY: Family History  Problem Relation Age of Onset  . Heart disease Mother   . Cancer Father   . Cancer  Paternal Grandfather     ADVANCED DIRECTIVES (Y/N):  N  HEALTH MAINTENANCE: Social History   Tobacco Use  . Smoking status:  Former Smoker    Packs/day: 1.00    Years: 30.00    Pack years: 30.00    Types: Cigarettes    Quit date: 1995    Years since quitting: 26.6  . Smokeless tobacco: Never Used  Vaping Use  . Vaping Use: Never used  Substance Use Topics  . Alcohol use: No    Alcohol/week: 0.0 standard drinks    Comment: occasional  . Drug use: No     Colonoscopy:  PAP:  Bone density:  Lipid panel:  Allergies  Allergen Reactions  . Shrimp [Shellfish Allergy] Anaphylaxis    Current Outpatient Medications  Medication Sig Dispense Refill  . acetaminophen (TYLENOL) 500 MG tablet Take 1,000 mg by mouth every 6 (six) hours as needed for moderate pain or headache.    . albuterol (VENTOLIN HFA) 108 (90 Base) MCG/ACT inhaler Inhale 2 puffs into the lungs daily.    Marland Kitchen allopurinol (ZYLOPRIM) 100 MG tablet Take 100 mg by mouth daily.    Marland Kitchen ascorbic acid (VITAMIN C) 500 MG tablet Take 500 mg by mouth daily.    Marland Kitchen aspirin 81 MG chewable tablet Chew 81 mg by mouth daily.    Marland Kitchen atorvastatin (LIPITOR) 20 MG tablet Take 20 mg by mouth at bedtime.     Marland Kitchen BREO ELLIPTA 100-25 MCG/INH AEPB Inhale 1 puff into the lungs daily.    . Cholecalciferol (DIALYVITE VITAMIN D 5000) 125 MCG (5000 UT) capsule Take 5,000 Units by mouth daily.    . colchicine 0.6 MG tablet Take 0.6 mg by mouth daily as needed (gout flare).    Marland Kitchen ELIQUIS 5 MG TABS tablet SMARTSIG:1 Tablet(s) By Mouth Every 12 Hours    . finasteride (PROSCAR) 5 MG tablet Take 1 tablet (5 mg total) by mouth daily. 30 tablet 0  . furosemide (LASIX) 80 MG tablet Take 80 mg by mouth daily.     . insulin aspart (NOVOLOG) 100 UNIT/ML FlexPen Inject 14-26 Units into the skin 3 (three) times daily with meals.     . insulin degludec (TRESIBA FLEXTOUCH) 200 UNIT/ML FlexTouch Pen Inject 46 Units into the skin at bedtime.    . Insulin Pen Needle 31G X 8 MM MISC BD Ultra-Fine Short Pen Needle 31 gauge x 5/16"    . lisinopril (ZESTRIL) 20 MG tablet Take 10 mg by mouth at bedtime.       . Magnesium Oxide 400 (240 Mg) MG TABS Take 400 mg by mouth in the morning, at noon, and at bedtime.    . metFORMIN (GLUCOPHAGE) 1000 MG tablet Take 1,000 mg by mouth 2 (two) times daily.    . metoprolol tartrate (LOPRESSOR) 25 MG tablet Take 50 mg by mouth 2 (two) times daily.     . pantoprazole (PROTONIX) 40 MG tablet Take 1 tablet (40 mg total) by mouth 2 (two) times daily before a meal. 60 tablet 0  . predniSONE (DELTASONE) 5 MG tablet Take 5 mg by mouth daily.    . sildenafil (REVATIO) 20 MG tablet Take 20 mg by mouth 3 (three) times daily.    . sodium chloride (OCEAN) 0.65 % SOLN nasal spray Place 1 spray into both nostrils as needed for congestion.    . tamsulosin (FLOMAX) 0.4 MG CAPS capsule TAKE 2 CAPSULES EVERY EVENING 180 capsule 0  . vitamin B-12 (CYANOCOBALAMIN) 1000 MCG tablet Take  1,000 mcg by mouth daily.     No current facility-administered medications for this visit.    OBJECTIVE: Vitals:   02/15/20 1201  BP: (!) 113/53  Pulse: 67  Resp: 18  Temp: (!) 97.4 F (36.3 C)  SpO2: 100%     Body mass index is 33.36 kg/m.    ECOG FS:1 - Symptomatic but completely ambulatory  General: Well-developed, well-nourished, no acute distress.  Sitting in a wheelchair. Eyes: Pink conjunctiva, anicteric sclera. HEENT: Normocephalic, moist mucous membranes. Lungs: No audible wheezing or coughing. Heart: Regular rate and rhythm. Abdomen: Soft, nontender, no obvious distention. Musculoskeletal: No edema, cyanosis, or clubbing. Neuro: Alert, answering all questions appropriately. Cranial nerves grossly intact. Skin: No rashes or petechiae noted. Psych: Normal affect.   LAB RESULTS:  Lab Results  Component Value Date   NA 138 10/18/2019   K 4.2 10/18/2019   CL 95 (L) 10/18/2019   CO2 31 10/18/2019   GLUCOSE 228 (H) 10/18/2019   BUN 44 (H) 10/18/2019   CREATININE 1.89 (H) 10/18/2019   CALCIUM 9.7 10/18/2019   PROT 5.3 (L) 06/23/2019   ALBUMIN 2.1 (L) 06/23/2019   AST 27  06/23/2019   ALT 26 06/23/2019   ALKPHOS 31 (L) 06/23/2019   BILITOT 1.4 (H) 06/23/2019   GFRNONAA 34 (L) 10/18/2019   GFRAA 39 (L) 10/18/2019    Lab Results  Component Value Date   WBC 10.8 (H) 02/15/2020   NEUTROABS 7.5 02/15/2020   HGB 8.1 (L) 02/15/2020   HCT 25.1 (L) 02/15/2020   MCV 98.0 02/15/2020   PLT 235 02/15/2020     STUDIES: No results found.  ASSESSMENT: Anemia, unspecified.  PLAN:   1. Anemia, unspecified: Patient's hemoglobin initially improved with Retacrit, but has trended back down after missing an injection. Previously, all of his other laboratory work including iron stores, B12, folate, hemolysis labs are all negative or within normal limits.  His reticulocyte count is inappropriately normal. IntelliGEN myeloid panel was not drawn and will repeat with next lab draw. His erythropoietin level is only mildly elevated, but still inappropriately low for this level of anemia.  Given his longstanding chronic renal insufficiency, he will benefit from 40,000 units of Retacrit.  Proceed with treatment today.  Return to clinic in 1, 2, and 3 months for laboratory work and Retacrit if his hemoglobin remains below 10.0.  Patient will then return to clinic in 4 months from repeat laboratory work, further evaluation, and continuation of treatment. 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  Kappa free light chains are only mildly elevated and immunoglobulins are within normal limits.  Continue to monitor on a yearly basis.    I spent a total of 30 minutes reviewing chart data, face-to-face evaluation with the patient, counseling and coordination of care as detailed above.    Patient expressed understanding and was in agreement with this plan. He also understands that He can call clinic at any time with any questions, concerns, or complaints.    Lloyd Huger, MD   02/17/2020 7:59 AM

## 2020-03-17 ENCOUNTER — Inpatient Hospital Stay: Payer: Medicare HMO | Attending: Oncology

## 2020-03-17 ENCOUNTER — Inpatient Hospital Stay: Payer: Medicare HMO

## 2020-03-17 ENCOUNTER — Other Ambulatory Visit: Payer: Self-pay

## 2020-03-17 VITALS — BP 124/66 | HR 65

## 2020-03-17 DIAGNOSIS — D472 Monoclonal gammopathy: Secondary | ICD-10-CM | POA: Insufficient documentation

## 2020-03-17 DIAGNOSIS — N189 Chronic kidney disease, unspecified: Secondary | ICD-10-CM | POA: Insufficient documentation

## 2020-03-17 DIAGNOSIS — Z79899 Other long term (current) drug therapy: Secondary | ICD-10-CM | POA: Diagnosis not present

## 2020-03-17 DIAGNOSIS — D649 Anemia, unspecified: Secondary | ICD-10-CM | POA: Diagnosis not present

## 2020-03-17 LAB — CBC WITH DIFFERENTIAL/PLATELET
Abs Immature Granulocytes: 0.03 10*3/uL (ref 0.00–0.07)
Basophils Absolute: 0.1 10*3/uL (ref 0.0–0.1)
Basophils Relative: 1 %
Eosinophils Absolute: 0.5 10*3/uL (ref 0.0–0.5)
Eosinophils Relative: 6 %
HCT: 24.6 % — ABNORMAL LOW (ref 39.0–52.0)
Hemoglobin: 7.9 g/dL — ABNORMAL LOW (ref 13.0–17.0)
Immature Granulocytes: 0 %
Lymphocytes Relative: 20 %
Lymphs Abs: 1.8 10*3/uL (ref 0.7–4.0)
MCH: 30.6 pg (ref 26.0–34.0)
MCHC: 32.1 g/dL (ref 30.0–36.0)
MCV: 95.3 fL (ref 80.0–100.0)
Monocytes Absolute: 0.8 10*3/uL (ref 0.1–1.0)
Monocytes Relative: 9 %
Neutro Abs: 5.9 10*3/uL (ref 1.7–7.7)
Neutrophils Relative %: 64 %
Platelets: 255 10*3/uL (ref 150–400)
RBC: 2.58 MIL/uL — ABNORMAL LOW (ref 4.22–5.81)
RDW: 15.7 % — ABNORMAL HIGH (ref 11.5–15.5)
WBC: 9.2 10*3/uL (ref 4.0–10.5)
nRBC: 0 % (ref 0.0–0.2)

## 2020-03-17 MED ORDER — EPOETIN ALFA-EPBX 40000 UNIT/ML IJ SOLN
40000.0000 [IU] | Freq: Once | INTRAMUSCULAR | Status: AC
  Administered 2020-03-17: 40000 [IU] via SUBCUTANEOUS
  Filled 2020-03-17: qty 1

## 2020-03-27 LAB — INTELLIGEN MYELOID

## 2020-04-14 ENCOUNTER — Inpatient Hospital Stay: Payer: Medicare HMO

## 2020-04-14 ENCOUNTER — Encounter (INDEPENDENT_AMBULATORY_CARE_PROVIDER_SITE_OTHER): Payer: Self-pay

## 2020-04-14 ENCOUNTER — Other Ambulatory Visit: Payer: Self-pay

## 2020-04-14 ENCOUNTER — Inpatient Hospital Stay: Payer: Medicare HMO | Attending: Oncology

## 2020-04-14 VITALS — BP 148/68 | HR 72

## 2020-04-14 DIAGNOSIS — N189 Chronic kidney disease, unspecified: Secondary | ICD-10-CM | POA: Insufficient documentation

## 2020-04-14 DIAGNOSIS — D649 Anemia, unspecified: Secondary | ICD-10-CM

## 2020-04-14 LAB — CBC WITH DIFFERENTIAL/PLATELET
Abs Immature Granulocytes: 0.02 10*3/uL (ref 0.00–0.07)
Basophils Absolute: 0.1 10*3/uL (ref 0.0–0.1)
Basophils Relative: 1 %
Eosinophils Absolute: 0.1 10*3/uL (ref 0.0–0.5)
Eosinophils Relative: 2 %
HCT: 25.4 % — ABNORMAL LOW (ref 39.0–52.0)
Hemoglobin: 8 g/dL — ABNORMAL LOW (ref 13.0–17.0)
Immature Granulocytes: 0 %
Lymphocytes Relative: 19 %
Lymphs Abs: 1.5 10*3/uL (ref 0.7–4.0)
MCH: 29.4 pg (ref 26.0–34.0)
MCHC: 31.5 g/dL (ref 30.0–36.0)
MCV: 93.4 fL (ref 80.0–100.0)
Monocytes Absolute: 0.9 10*3/uL (ref 0.1–1.0)
Monocytes Relative: 11 %
Neutro Abs: 5.3 10*3/uL (ref 1.7–7.7)
Neutrophils Relative %: 67 %
Platelets: 263 10*3/uL (ref 150–400)
RBC: 2.72 MIL/uL — ABNORMAL LOW (ref 4.22–5.81)
RDW: 15.9 % — ABNORMAL HIGH (ref 11.5–15.5)
WBC: 8 10*3/uL (ref 4.0–10.5)
nRBC: 0 % (ref 0.0–0.2)

## 2020-04-14 MED ORDER — EPOETIN ALFA-EPBX 40000 UNIT/ML IJ SOLN
40000.0000 [IU] | Freq: Once | INTRAMUSCULAR | Status: AC
  Administered 2020-04-14: 40000 [IU] via SUBCUTANEOUS
  Filled 2020-04-14: qty 1

## 2020-04-24 ENCOUNTER — Other Ambulatory Visit: Payer: Self-pay | Admitting: Physician Assistant

## 2020-04-24 DIAGNOSIS — N401 Enlarged prostate with lower urinary tract symptoms: Secondary | ICD-10-CM

## 2020-05-12 ENCOUNTER — Inpatient Hospital Stay: Payer: Medicare HMO | Attending: Oncology

## 2020-05-12 ENCOUNTER — Inpatient Hospital Stay: Payer: Medicare HMO

## 2020-05-12 ENCOUNTER — Other Ambulatory Visit: Payer: Self-pay

## 2020-05-12 VITALS — BP 145/64 | HR 77

## 2020-05-12 DIAGNOSIS — N189 Chronic kidney disease, unspecified: Secondary | ICD-10-CM | POA: Diagnosis present

## 2020-05-12 DIAGNOSIS — D649 Anemia, unspecified: Secondary | ICD-10-CM

## 2020-05-12 LAB — CBC WITH DIFFERENTIAL/PLATELET
Abs Immature Granulocytes: 0.03 10*3/uL (ref 0.00–0.07)
Basophils Absolute: 0.1 10*3/uL (ref 0.0–0.1)
Basophils Relative: 1 %
Eosinophils Absolute: 0.2 10*3/uL (ref 0.0–0.5)
Eosinophils Relative: 2 %
HCT: 26.3 % — ABNORMAL LOW (ref 39.0–52.0)
Hemoglobin: 8 g/dL — ABNORMAL LOW (ref 13.0–17.0)
Immature Granulocytes: 0 %
Lymphocytes Relative: 19 %
Lymphs Abs: 1.8 10*3/uL (ref 0.7–4.0)
MCH: 28.1 pg (ref 26.0–34.0)
MCHC: 30.4 g/dL (ref 30.0–36.0)
MCV: 92.3 fL (ref 80.0–100.0)
Monocytes Absolute: 0.6 10*3/uL (ref 0.1–1.0)
Monocytes Relative: 7 %
Neutro Abs: 6.5 10*3/uL (ref 1.7–7.7)
Neutrophils Relative %: 71 %
Platelets: 280 10*3/uL (ref 150–400)
RBC: 2.85 MIL/uL — ABNORMAL LOW (ref 4.22–5.81)
RDW: 16.3 % — ABNORMAL HIGH (ref 11.5–15.5)
WBC: 9.1 10*3/uL (ref 4.0–10.5)
nRBC: 0 % (ref 0.0–0.2)

## 2020-05-12 MED ORDER — EPOETIN ALFA-EPBX 40000 UNIT/ML IJ SOLN
40000.0000 [IU] | Freq: Once | INTRAMUSCULAR | Status: AC
  Administered 2020-05-12: 40000 [IU] via SUBCUTANEOUS
  Filled 2020-05-12: qty 1

## 2020-06-11 NOTE — Progress Notes (Signed)
[facility]  Telephone:(336) [phone] Fax:(336) [phone]  ID: Jay Morris OB: [birth year]  MR#: [id number]  QMG#:[id number]  Patient Care Team: [name], MD as PCP - General (Internal Medicine)  CHIEF COMPLAINT: Anemia, unspecified.  INTERVAL HISTORY: Patient returns to clinic today for repeat laboratory work, further evaluation, and continuation of Retacrit.  Despite receiving treatment once per month, patient's hemoglobin remains decreased between 8 and 9.0.  He continues have chronic weakness and fatigue, but otherwise feels well. He has no neurologic complaints.  He denies any recent fevers or illnesses.  He has a good appetite and denies weight loss.  He has chronic shortness of breath requiring oxygen, but denies any chest pain, cough, or hemoptysis.  He denies any nausea, vomiting, constipation, or diarrhea.  He has no melena or hematochezia.  He has no urinary complaints.  Patient offers no further specific complaints today.  REVIEW OF SYSTEMS:   Review of Systems  Constitutional: Positive for malaise/fatigue. Negative for fever and weight loss.  Respiratory: Positive for shortness of breath. Negative for cough and hemoptysis.   Cardiovascular: Negative.  Negative for chest pain and leg swelling.  Gastrointestinal: Negative.  Negative for abdominal pain, blood in stool and melena.  Genitourinary: Negative.  Negative for hematuria.  Musculoskeletal: Negative.  Negative for back pain.  Skin: Negative.  Negative for rash.  Neurological: Positive for weakness. Negative for dizziness, focal weakness and headaches.  Psychiatric/Behavioral: Negative.  The patient is not nervous/anxious.     As per HPI. Otherwise, a complete review of systems is negative.  PAST MEDICAL HISTORY: Past Medical History:  Diagnosis Date  . Asthma   . Atrial fibrillation ([place])   . BPH (benign prostatic hyperplasia)   . COPD (chronic obstructive pulmonary disease) ([place])   .  Diabetes mellitus without complication ([place])   . Helicobacter pylori gastritis   . Hyperlipidemia   . Hypertension   . Pneumonia   . Right heart failure ([place])     PAST SURGICAL HISTORY: Past Surgical History:  Procedure Laterality Date  . CARDIAC ELECTROPHYSIOLOGY MAPPING AND ABLATION    . CARDIOVERSION N/A 06/22/2019   Procedure: CARDIOVERSION;  Surgeon: Fay Records, MD;  Location: [facility];  Service: Cardiovascular;  Laterality: N/A;  . COLONOSCOPY WITH PROPOFOL N/A 06/15/2017   Procedure: COLONOSCOPY WITH PROPOFOL;  Surgeon: Toledo, Benay Pike, MD;  Location: [facility] ENDOSCOPY;  Service: Gastroenterology;  Laterality: N/A;  . ENTEROSCOPY N/A 11/24/2017   Procedure: ENTEROSCOPY;  Surgeon: Jonathon Bellows, MD;  Location: [facility] ENDOSCOPY;  Service: Gastroenterology;  Laterality: N/A;  . ESOPHAGOGASTRODUODENOSCOPY N/A 11/27/2017   Procedure: ESOPHAGOGASTRODUODENOSCOPY (EGD);  Surgeon: Lucilla Lame, MD;  Location: [facility] ENDOSCOPY;  Service: Endoscopy;  Laterality: N/A;  . ESOPHAGOGASTRODUODENOSCOPY (EGD) WITH PROPOFOL N/A 06/02/2017   Procedure: ESOPHAGOGASTRODUODENOSCOPY (EGD) WITH PROPOFOL;  Surgeon: Toledo, Benay Pike, MD;  Location: [facility] ENDOSCOPY;  Service: Gastroenterology;  Laterality: N/A;  . GIVENS CAPSULE STUDY N/A 11/25/2017   Procedure: GIVENS CAPSULE STUDY;  Surgeon: Jonathon Bellows, MD;  Location: [facility] ENDOSCOPY;  Service: Gastroenterology;  Laterality: N/A;  . HAND SURGERY    . RIGHT/LEFT HEART CATH AND CORONARY ANGIOGRAPHY N/A 01/17/2020   Procedure: RIGHT/LEFT HEART CATH AND CORONARY ANGIOGRAPHY;  Surgeon: Corey Skains, MD;  Location: [facility] CV LAB;  Service: Cardiovascular;  Laterality: N/A;    FAMILY HISTORY: Family History  Problem Relation Age of Onset  . Heart disease Mother   . Cancer Father   . Cancer Paternal Grandfather     ADVANCED  DIRECTIVES (Y/N):  N  HEALTH MAINTENANCE: Social History   Tobacco Use  . Smoking status: Former Smoker    Packs/day: 1.00     Years: 30.00    Pack years: 30.00    Types: Cigarettes    Quit date: 1995    Years since quitting: 26.9  . Smokeless tobacco: Never Used  Vaping Use  . Vaping Use: Never used  Substance Use Topics  . Alcohol use: No    Alcohol/week: 0.0 standard drinks    Comment: occasional  . Drug use: No     Colonoscopy:  PAP:  Bone density:  Lipid panel:  Allergies  Allergen Reactions  . Shrimp [Shellfish Allergy] Anaphylaxis    Current Outpatient Medications  Medication Sig Dispense Refill  . acetaminophen (TYLENOL) 500 MG tablet Take 1,000 mg by mouth every 6 (six) hours as needed for moderate pain or headache.    . albuterol (VENTOLIN HFA) 108 (90 Base) MCG/ACT inhaler Inhale 2 puffs into the lungs daily.    Marland Kitchen allopurinol (ZYLOPRIM) 100 MG tablet Take 100 mg by mouth daily.    Marland Kitchen ascorbic acid (VITAMIN C) 500 MG tablet Take 500 mg by mouth daily.    Marland Kitchen atorvastatin (LIPITOR) 20 MG tablet Take 20 mg by mouth at bedtime.     Marland Kitchen BREO ELLIPTA 100-25 MCG/INH AEPB Inhale 1 puff into the lungs daily.    . Cholecalciferol (DIALYVITE VITAMIN D 5000) 125 MCG (5000 UT) capsule Take 5,000 Units by mouth daily.    . colchicine 0.6 MG tablet Take 0.6 mg by mouth daily as needed (gout flare).    Marland Kitchen ELIQUIS 5 MG TABS tablet SMARTSIG:1 Tablet(s) By Mouth Every 12 Hours    . furosemide (LASIX) 80 MG tablet Take 40 mg by mouth daily.     . insulin aspart (NOVOLOG) 100 UNIT/ML FlexPen Inject 14-26 Units into the skin 3 (three) times daily with meals.     . insulin degludec (TRESIBA FLEXTOUCH) 200 UNIT/ML FlexTouch Pen Inject 46 Units into the skin at bedtime.    . Insulin Pen Needle 31G X 8 MM MISC BD Ultra-Fine Short Pen Needle 31 gauge x 5/16"    . lisinopril (ZESTRIL) 20 MG tablet Take 10 mg by mouth at bedtime.     . Magnesium Oxide 400 (240 Mg) MG TABS Take 400 mg by mouth in the morning, at noon, and at bedtime.    . metFORMIN (GLUCOPHAGE) 1000 MG tablet Take 1,000 mg by mouth 2 (two) times daily.     . metoprolol tartrate (LOPRESSOR) 25 MG tablet Take 50 mg by mouth 2 (two) times daily.     . pantoprazole (PROTONIX) 40 MG tablet Take 1 tablet (40 mg total) by mouth 2 (two) times daily before a meal. 60 tablet 0  . sildenafil (REVATIO) 20 MG tablet Take 20 mg by mouth 3 (three) times daily.    . sodium chloride (OCEAN) 0.65 % SOLN nasal spray Place 1 spray into both nostrils as needed for congestion.    . tamsulosin (FLOMAX) 0.4 MG CAPS capsule TAKE TWO CAPSULES EVERY EVENING 180 capsule 0  . TYVASO REFILL 0.6 MG/ML SOLN Inhale 0.6 mg into the lungs in the morning, at noon, in the evening, and at bedtime.    . vitamin B-12 (CYANOCOBALAMIN) 1000 MCG tablet Take 1,000 mcg by mouth daily.     No current facility-administered medications for this visit.    OBJECTIVE: Vitals:   06/17/20 1104  BP: (!) 117/54  Pulse:  65  Resp: 16  Temp: 98.8 F (37.1 C)  SpO2: 100%     Body mass index is 30.43 kg/m.    ECOG FS:1 - Symptomatic but completely ambulatory  General: Well-developed, well-nourished, no acute distress. Eyes: Pink conjunctiva, anicteric sclera. HEENT: Normocephalic, moist mucous membranes. Lungs: No audible wheezing or coughing. Heart: Regular rate and rhythm. Abdomen: Soft, nontender, no obvious distention. Musculoskeletal: No edema, cyanosis, or clubbing. Neuro: Alert, answering all questions appropriately. Cranial nerves grossly intact. Skin: No rashes or petechiae noted. Psych: Normal affect.  LAB RESULTS:  Lab Results  Component Value Date   NA 138 10/18/2019   K 4.2 10/18/2019   CL 95 (L) 10/18/2019   CO2 31 10/18/2019   GLUCOSE 228 (H) 10/18/2019   BUN 44 (H) 10/18/2019   CREATININE 1.89 (H) 10/18/2019   CALCIUM 9.7 10/18/2019   PROT 5.3 (L) 06/23/2019   ALBUMIN 2.1 (L) 06/23/2019   AST 27 06/23/2019   ALT 26 06/23/2019   ALKPHOS 31 (L) 06/23/2019   BILITOT 1.4 (H) 06/23/2019   GFRNONAA 34 (L) 10/18/2019   GFRAA 39 (L) 10/18/2019    Lab Results   Component Value Date   WBC 9.4 06/17/2020   NEUTROABS 7.0 06/17/2020   HGB 8.3 (L) 06/17/2020   HCT 27.2 (L) 06/17/2020   MCV 91.9 06/17/2020   PLT 290 06/17/2020     STUDIES: No results found.  ASSESSMENT: Anemia, unspecified.  PLAN:   1. Anemia, unspecified: Patient's hemoglobin remains essentially unchanged at 8.3 despite receiving Retacrit approximately every 4 weeks. Previously, all of his other laboratory work including iron stores, B12, folate, hemolysis labs are all negative or within normal limits.  His reticulocyte count is inappropriately normal. 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  We will proceed with Retacrit today, but patient has also agreed to a bone marrow biopsy to confirm the diagnosis.  Return to clinic 1 week after his biopsy to discuss the results and additional treatment planning if needed.  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  Kappa free light chains are only mildly elevated and immunoglobulins are within normal limits.  Continue to monitor on a yearly basis.  Bone marrow biopsy as above.  I spent a total of 30 minutes reviewing chart data, face-to-face evaluation with the patient, counseling and coordination of care as detailed above.   Patient expressed understanding and was in agreement with this plan. He also understands that He can call clinic at any time with any questions, concerns, or complaints.    Lloyd Huger, MD   06/17/2020 12:13 PM

## 2020-06-17 ENCOUNTER — Telehealth: Payer: Self-pay

## 2020-06-17 ENCOUNTER — Inpatient Hospital Stay: Payer: Medicare HMO

## 2020-06-17 ENCOUNTER — Other Ambulatory Visit: Payer: Self-pay | Admitting: Oncology

## 2020-06-17 ENCOUNTER — Encounter: Payer: Self-pay | Admitting: Oncology

## 2020-06-17 ENCOUNTER — Other Ambulatory Visit: Payer: Self-pay | Admitting: *Deleted

## 2020-06-17 ENCOUNTER — Other Ambulatory Visit: Payer: Self-pay

## 2020-06-17 ENCOUNTER — Inpatient Hospital Stay: Payer: Medicare HMO | Attending: Oncology | Admitting: Oncology

## 2020-06-17 VITALS — BP 117/54 | HR 65 | Temp 98.8°F | Resp 16 | Wt 188.6 lb

## 2020-06-17 DIAGNOSIS — Z23 Encounter for immunization: Secondary | ICD-10-CM | POA: Insufficient documentation

## 2020-06-17 DIAGNOSIS — D472 Monoclonal gammopathy: Secondary | ICD-10-CM

## 2020-06-17 DIAGNOSIS — D649 Anemia, unspecified: Secondary | ICD-10-CM

## 2020-06-17 LAB — CBC WITH DIFFERENTIAL/PLATELET
Abs Immature Granulocytes: 0.02 10*3/uL (ref 0.00–0.07)
Basophils Absolute: 0.1 10*3/uL (ref 0.0–0.1)
Basophils Relative: 1 %
Eosinophils Absolute: 0.2 10*3/uL (ref 0.0–0.5)
Eosinophils Relative: 2 %
HCT: 27.2 % — ABNORMAL LOW (ref 39.0–52.0)
Hemoglobin: 8.3 g/dL — ABNORMAL LOW (ref 13.0–17.0)
Immature Granulocytes: 0 %
Lymphocytes Relative: 16 %
Lymphs Abs: 1.5 10*3/uL (ref 0.7–4.0)
MCH: 28 pg (ref 26.0–34.0)
MCHC: 30.5 g/dL (ref 30.0–36.0)
MCV: 91.9 fL (ref 80.0–100.0)
Monocytes Absolute: 0.7 10*3/uL (ref 0.1–1.0)
Monocytes Relative: 7 %
Neutro Abs: 7 10*3/uL (ref 1.7–7.7)
Neutrophils Relative %: 74 %
Platelets: 290 10*3/uL (ref 150–400)
RBC: 2.96 MIL/uL — ABNORMAL LOW (ref 4.22–5.81)
RDW: 17.2 % — ABNORMAL HIGH (ref 11.5–15.5)
WBC: 9.4 10*3/uL (ref 4.0–10.5)
nRBC: 0 % (ref 0.0–0.2)

## 2020-06-17 MED ORDER — EPOETIN ALFA-EPBX 40000 UNIT/ML IJ SOLN
40000.0000 [IU] | Freq: Once | INTRAMUSCULAR | Status: AC
  Administered 2020-06-17: 40000 [IU] via SUBCUTANEOUS
  Filled 2020-06-17: qty 1

## 2020-06-17 MED ORDER — INFLUENZA VAC A&B SA ADJ QUAD 0.5 ML IM PRSY
0.5000 mL | PREFILLED_SYRINGE | Freq: Once | INTRAMUSCULAR | Status: AC
  Administered 2020-06-17: 0.5 mL via INTRAMUSCULAR

## 2020-06-17 NOTE — Telephone Encounter (Signed)
Patients wife informed, over the phone, about bone marrow biopsy- scheduled for Wed. [facility]/20 [facility].

## 2020-06-23 ENCOUNTER — Ambulatory Visit: Payer: Medicare HMO

## 2020-06-23 NOTE — Progress Notes (Signed)
Patient on schedule for BMB 06/25/2020, called and spoke with wife on phone with pre procedure instructions given,made aware to be here @ 0730, NPO after MN prior to procedure as well as driver for discharge post procedure/recovery. Also to hold am dose of insulin/po diabetic meds, and 1/2 dose of insulin hs day before procedure. Stated understanding.

## 2020-06-24 ENCOUNTER — Other Ambulatory Visit: Payer: Self-pay | Admitting: Radiology

## 2020-06-25 ENCOUNTER — Other Ambulatory Visit: Payer: Self-pay

## 2020-06-25 ENCOUNTER — Ambulatory Visit
Admission: RE | Admit: 2020-06-25 | Discharge: 2020-06-25 | Disposition: A | Discharge status: Home / Self Care | Payer: Medicare HMO | Source: Ambulatory Visit | Attending: Oncology | Admitting: Oncology

## 2020-06-25 DIAGNOSIS — D649 Anemia, unspecified: Secondary | ICD-10-CM | POA: Insufficient documentation

## 2020-06-25 DIAGNOSIS — I4891 Unspecified atrial fibrillation: Secondary | ICD-10-CM | POA: Diagnosis not present

## 2020-06-25 DIAGNOSIS — Z87891 Personal history of nicotine dependence: Secondary | ICD-10-CM | POA: Diagnosis not present

## 2020-06-25 DIAGNOSIS — J449 Chronic obstructive pulmonary disease, unspecified: Secondary | ICD-10-CM | POA: Insufficient documentation

## 2020-06-25 DIAGNOSIS — Z7901 Long term (current) use of anticoagulants: Secondary | ICD-10-CM | POA: Diagnosis not present

## 2020-06-25 DIAGNOSIS — D7589 Other specified diseases of blood and blood-forming organs: Secondary | ICD-10-CM | POA: Diagnosis not present

## 2020-06-25 DIAGNOSIS — I50810 Right heart failure, unspecified: Secondary | ICD-10-CM | POA: Insufficient documentation

## 2020-06-25 DIAGNOSIS — E119 Type 2 diabetes mellitus without complications: Secondary | ICD-10-CM | POA: Insufficient documentation

## 2020-06-25 DIAGNOSIS — Z794 Long term (current) use of insulin: Secondary | ICD-10-CM | POA: Insufficient documentation

## 2020-06-25 DIAGNOSIS — Z8249 Family history of ischemic heart disease and other diseases of the circulatory system: Secondary | ICD-10-CM | POA: Diagnosis not present

## 2020-06-25 DIAGNOSIS — E785 Hyperlipidemia, unspecified: Secondary | ICD-10-CM | POA: Diagnosis not present

## 2020-06-25 LAB — BASIC METABOLIC PANEL
Anion gap: 11 (ref 5–15)
BUN: 31 mg/dL — ABNORMAL HIGH (ref 8–23)
CO2: 24 mmol/L (ref 22–32)
Calcium: 9.2 mg/dL (ref 8.9–10.3)
Chloride: 101 mmol/L (ref 98–111)
Creatinine, Ser: 2.26 mg/dL — ABNORMAL HIGH (ref 0.61–1.24)
GFR, Estimated: 29 mL/min — ABNORMAL LOW (ref >60)
Glucose, Bld: 119 mg/dL — ABNORMAL HIGH (ref 70–99)
Potassium: 4.6 mmol/L (ref 3.5–5.1)
Sodium: 136 mmol/L (ref 135–145)

## 2020-06-25 LAB — CBC WITH DIFFERENTIAL/PLATELET
Abs Immature Granulocytes: 0.02 10*3/uL (ref 0.00–0.07)
Basophils Absolute: 0 10*3/uL (ref 0.0–0.1)
Basophils Relative: 1 %
Eosinophils Absolute: 0.2 10*3/uL (ref 0.0–0.5)
Eosinophils Relative: 3 %
HCT: 27 % — ABNORMAL LOW (ref 39.0–52.0)
Hemoglobin: 8.2 g/dL — ABNORMAL LOW (ref 13.0–17.0)
Immature Granulocytes: 0 %
Lymphocytes Relative: 22 %
Lymphs Abs: 1.8 10*3/uL (ref 0.7–4.0)
MCH: 27.8 pg (ref 26.0–34.0)
MCHC: 30.4 g/dL (ref 30.0–36.0)
MCV: 91.5 fL (ref 80.0–100.0)
Monocytes Absolute: 0.6 10*3/uL (ref 0.1–1.0)
Monocytes Relative: 8 %
Neutro Abs: 5.4 10*3/uL (ref 1.7–7.7)
Neutrophils Relative %: 66 %
Platelets: 331 10*3/uL (ref 150–400)
RBC: 2.95 MIL/uL — ABNORMAL LOW (ref 4.22–5.81)
RDW: 17.9 % — ABNORMAL HIGH (ref 11.5–15.5)
WBC: 8.1 10*3/uL (ref 4.0–10.5)
nRBC: 0 % (ref 0.0–0.2)

## 2020-06-25 LAB — PROTIME-INR
INR: 1.2 (ref 0.8–1.2)
Prothrombin Time: 14.9 seconds (ref 11.4–15.2)

## 2020-06-25 LAB — GLUCOSE, CAPILLARY
Glucose-Capillary: 99 mg/dL (ref 70–99)
Glucose-Capillary: 105 mg/dL — ABNORMAL HIGH (ref 70–99)

## 2020-06-25 MED ORDER — MIDAZOLAM HCL 5 MG/5ML IJ SOLN
INTRAMUSCULAR | Status: AC
  Filled 2020-06-25: qty 5

## 2020-06-25 MED ORDER — FENTANYL CITRATE (PF) 100 MCG/2ML IJ SOLN
INTRAMUSCULAR | Status: AC
  Filled 2020-06-25: qty 2

## 2020-06-25 MED ORDER — SODIUM CHLORIDE 0.9 % IV SOLN: INTRAVENOUS | Status: DC

## 2020-06-25 MED ORDER — HEPARIN SOD (PORK) LOCK FLUSH 100 UNIT/ML IV SOLN
INTRAVENOUS | Status: AC
  Filled 2020-06-25: qty 5

## 2020-06-25 MED ORDER — MIDAZOLAM HCL 5 MG/5ML IJ SOLN
INTRAMUSCULAR | Status: AC | PRN
  Administered 2020-06-25: 0.5 mg via INTRAVENOUS

## 2020-06-25 MED ORDER — FENTANYL CITRATE (PF) 100 MCG/2ML IJ SOLN
INTRAMUSCULAR | Status: AC | PRN
Start: 2020-06-25 — End: 2020-06-25
  Administered 2020-06-25: 25 ug via INTRAVENOUS

## 2020-06-25 NOTE — Consult Note (Signed)
Chief Complaint: Anemia   Referring Physician(s): Finnegan,Timothy J  Patient Status: [facility] - Out-pt  History of Present Illness: Jay Morris is a 74 y.o. male with past medical history significant for atrial fibrillation (on Eliquis) COPD on continuous supplemental oxygen) diabetes, right-sided heart failure, hyperlipidemia who presents today for CT-guided bone marrow biopsy and aspiration for evaluation of anemia.  Patient complains of baseline malaise/fatigue, nonlocalized weakness and chronic shortness of breath.    He is otherwise without complaint.  Specifically, no chest pain, fever or chills.     Past Medical History:  Diagnosis Date  . Asthma   . Atrial fibrillation ([place])   . BPH (benign prostatic hyperplasia)   . COPD (chronic obstructive pulmonary disease) ([place])   . Diabetes mellitus without complication ([place])   . Helicobacter pylori gastritis   . Hyperlipidemia   . Hypertension   . Pneumonia   . Right heart failure [facility])     Past Surgical History:  Procedure Laterality Date  . BACK SURGERY     x3 L3-L4   . CARDIAC ELECTROPHYSIOLOGY MAPPING AND ABLATION    . CARDIOVERSION N/A 06/22/2019   Procedure: CARDIOVERSION;  Surgeon: Fay Records, MD;  Location: [place];  Service: Cardiovascular;  Laterality: N/A;  . COLONOSCOPY WITH PROPOFOL N/A 06/15/2017   Procedure: COLONOSCOPY WITH PROPOFOL;  Surgeon: Toledo, Benay Pike, MD;  Location: [facility] ENDOSCOPY;  Service: Gastroenterology;  Laterality: N/A;  . ENTEROSCOPY N/A 11/24/2017   Procedure: ENTEROSCOPY;  Surgeon: Jonathon Bellows, MD;  Location: [facility] ENDOSCOPY;  Service: Gastroenterology;  Laterality: N/A;  . ESOPHAGOGASTRODUODENOSCOPY N/A 11/27/2017   Procedure: ESOPHAGOGASTRODUODENOSCOPY (EGD);  Surgeon: Lucilla Lame, MD;  Location: [facility] ENDOSCOPY;  Service: Endoscopy;  Laterality: N/A;  . ESOPHAGOGASTRODUODENOSCOPY (EGD) WITH PROPOFOL N/A 06/02/2017   Procedure: ESOPHAGOGASTRODUODENOSCOPY (EGD) WITH  PROPOFOL;  Surgeon: Toledo, Benay Pike, MD;  Location: [facility] ENDOSCOPY;  Service: Gastroenterology;  Laterality: N/A;  . GIVENS CAPSULE STUDY N/A 11/25/2017   Procedure: GIVENS CAPSULE STUDY;  Surgeon: Jonathon Bellows, MD;  Location: [facility] ENDOSCOPY;  Service: Gastroenterology;  Laterality: N/A;  . HAND SURGERY    . RIGHT/LEFT HEART CATH AND CORONARY ANGIOGRAPHY N/A 01/17/2020   Procedure: RIGHT/LEFT HEART CATH AND CORONARY ANGIOGRAPHY;  Surgeon: Corey Skains, MD;  Location: [place] CV LAB;  Service: Cardiovascular;  Laterality: N/A;    Allergies: Shrimp [shellfish allergy]  Medications: Prior to Admission medications   Medication Sig Start Date End Date Taking? Authorizing Provider  albuterol (VENTOLIN HFA) 108 (90 Base) MCG/ACT inhaler Inhale 2 puffs into the lungs daily.   Yes [provider]  allopurinol (ZYLOPRIM) 100 MG tablet Take 100 mg by mouth daily.   Yes [provider]  ascorbic acid (VITAMIN C) 500 MG tablet Take 500 mg by mouth daily.   Yes [provider]  atorvastatin (LIPITOR) 20 MG tablet Take 20 mg by mouth at bedtime.    Yes [provider]  BREO ELLIPTA 100-25 MCG/INH AEPB Inhale 1 puff into the lungs daily. 12/04/15  Yes [provider]  Cholecalciferol (DIALYVITE VITAMIN D 5000) 125 MCG (5000 UT) capsule Take 5,000 Units by mouth daily.   Yes [provider]  ELIQUIS 5 MG TABS tablet SMARTSIG:1 Tablet(s) By Mouth Every 12 Hours 01/21/20  Yes [provider]  furosemide (LASIX) 80 MG tablet Take 40 mg by mouth daily.    Yes [provider]  insulin aspart (NOVOLOG) 100 UNIT/ML FlexPen Inject 14-26 Units into the skin 3 (three) times  daily with meals.  08/27/19  Yes [provider]  insulin degludec (TRESIBA FLEXTOUCH) 200 UNIT/ML FlexTouch Pen Inject 46 Units into the skin at bedtime.   Yes [provider]  lisinopril (ZESTRIL) 20 MG tablet Take 10 mg by mouth at bedtime.    Yes  [provider]  Magnesium Oxide 400 (240 Mg) MG TABS Take 400 mg by mouth in the morning, at noon, and at bedtime.   Yes [provider]  metFORMIN (GLUCOPHAGE) 1000 MG tablet Take 1,000 mg by mouth 2 (two) times daily. 08/16/19  Yes [provider]  metoprolol tartrate (LOPRESSOR) 25 MG tablet Take 50 mg by mouth 2 (two) times daily.    Yes [provider]  pantoprazole (PROTONIX) 40 MG tablet Take 1 tablet (40 mg total) by mouth 2 (two) times daily before a meal. 06/25/19  Yes Alekh, Kshitiz, MD  tamsulosin (FLOMAX) 0.4 MG CAPS capsule TAKE TWO CAPSULES EVERY EVENING 04/24/20  Yes Vaillancourt, Samantha, PA-C  TYVASO REFILL 0.6 MG/ML SOLN Inhale 0.6 mg into the lungs in the morning, at noon, in the evening, and at bedtime. 05/21/20  Yes [provider]  vitamin B-12 (CYANOCOBALAMIN) 1000 MCG tablet Take 1,000 mcg by mouth daily.   Yes [provider]  acetaminophen (TYLENOL) 500 MG tablet Take 1,000 mg by mouth every 6 (six) hours as needed for moderate pain or headache.    [provider]  colchicine 0.6 MG tablet Take 0.6 mg by mouth daily as needed (gout flare). Patient not taking: Reported on 06/25/2020    [provider]  Insulin Pen Needle 31G X 8 MM MISC BD Ultra-Fine Short Pen Needle 31 gauge x 5/16"    [provider]  sildenafil (REVATIO) 20 MG tablet Take 20 mg by mouth 3 (three) times daily.    [provider]  sodium chloride (OCEAN) 0.65 % SOLN nasal spray Place 1 spray into both nostrils as needed for congestion. Patient not taking: Reported on 06/25/2020    [provider]     Family History  Problem Relation Age of Onset  . Heart disease Mother   . Cancer Father   . Cancer Paternal Grandfather     Social History   Socioeconomic History  . Marital status: Married    Spouse name: Peter Congo   . Number of children: 2  . Years of education: Not on file  . Highest education level:  Not on file  Occupational History  . Occupation: retired     Comment: Engineer, materials   Tobacco Use  . Smoking status: Former Smoker    Packs/day: 1.00    Years: 30.00    Pack years: 30.00    Types: Cigarettes    Quit date: 1995    Years since quitting: 26.9  . Smokeless tobacco: Never Used  Vaping Use  . Vaping Use: Never used  Substance and Sexual Activity  . Alcohol use: No    Alcohol/week: 0.0 standard drinks    Comment: occasional  . Drug use: No  . Sexual activity: Yes  Other Topics Concern  . Not on file  Social History Narrative   Lives at home with wife    Social Determinants of Health   Financial Resource Strain:   . Difficulty of Paying Living Expenses: Not on file  Food Insecurity:   . Worried About Charity fundraiser in the Last Year: Not on file  . Ran Out of Food in the Last Year: Not  on file  Transportation Needs:   . Lack of Transportation (Medical): Not on file  . Lack of Transportation (Non-Medical): Not on file  Physical Activity:   . Days of Exercise per Week: Not on file  . Minutes of Exercise per Session: Not on file  Stress:   . Feeling of Stress : Not on file  Social Connections:   . Frequency of Communication with Friends and Family: Not on file  . Frequency of Social Gatherings with Friends and Family: Not on file  . Attends Religious Services: Not on file  . Active Member of Clubs or Organizations: Not on file  . Attends Archivist Meetings: Not on file  . Marital Status: Not on file    ECOG Status: 2 - Symptomatic, <50% confined to bed  Review of Systems: A 12 point ROS discussed and pertinent positives are indicated in the HPI above.  All other systems are negative.  Review of Systems  Vital Signs: BP (!) 153/72   Pulse 95   Temp 97.8 F (36.6 C) (Oral)   Resp (!) 22   Ht _0  (1.727 m)   Wt 85.5 kg   SpO2 90%   BMI 28.66 kg/m   Physical Exam  Imaging: No results found.  Labs:  CBC: Recent Labs     04/14/20 1037 05/12/20 1044 06/17/20 1048 06/25/20 0755  WBC 8.0 9.1 9.4 8.1  HGB 8.0* 8.0* 8.3* 8.2*  HCT 25.4* 26.3* 27.2* 27.0*  PLT 263 280 290 331    COAGS: Recent Labs    06/25/20 0755  INR 1.2    BMP: Recent Labs    10/03/19 1039 10/18/19 1049 06/25/20 0755  NA  --  138 136  K  --  4.2 4.6  CL  --  95* 101  CO2  --  31 24  GLUCOSE  --  228* 119*  BUN  --  44* 31*  CALCIUM  --  9.7 9.2  CREATININE 1.60* 1.89* 2.26*  GFRNONAA  --  34* 29*  GFRAA  --  39*  --     LIVER FUNCTION TESTS: No results for input(s): BILITOT, AST, ALT, ALKPHOS, PROT, ALBUMIN in the last 8760 hours.  TUMOR MARKERS: No results for input(s): AFPTM, CEA, CA199, CHROMGRNA in the last 8760 hours.  Assessment and Plan:  Jay Morris is a 74 y.o. male with past medical history significant for atrial fibrillation (on Eliquis) COPD on continuous supplemental oxygen) diabetes, right-sided heart failure, hyperlipidemia who presents today for CT-guided bone marrow biopsy and aspiration for evaluation of anemia.  Patient complains of baseline malaise/fatigue, nonlocalized weakness and chronic shortness of breath.    He is otherwise without complaint.  Specifically, no chest pain, fever or chills.  Risks and benefits of CT guided BM Bx was discussed with the patient and/or patient's family including, but not limited to bleeding, infection, damage to adjacent structures or low yield requiring additional tests.  All of the questions were answered and there is agreement to proceed.  Consent signed and in chart.  Thank you for this interesting consult.  I greatly enjoyed meeting Jay Morris and look forward to participating in their care.  A copy of this report was sent to the requesting provider on this date.  Electronically Signed: Sandi Mariscal, MD 06/25/2020, 8:52 AM   I spent a total of 15 Minutes in face to face in clinical consultation, greater than 50% of which was  counseling/coordinating care for CT guided BM Bx.

## 2020-06-25 NOTE — Discharge Instructions (Signed)
Bone Marrow Aspiration and Bone Marrow Biopsy, Adult, Care After This sheet gives you information about how to care for yourself after your procedure. Your health care provider may also give you more specific instructions. If you have problems or questions, contact your health care provider. What can I expect after the procedure? After the procedure, it is common to have:  Mild pain and tenderness.  Swelling.  Bruising. Follow these instructions at home: Puncture site care   Follow instructions from your health care provider about how to take care of the puncture site. Make sure you: ? Wash your hands with soap and water before and after you change your bandage (dressing). If soap and water are not available, use hand sanitizer. ? Change your dressing as told by your health care provider.  Check your puncture site every day for signs of infection. Check for: ? More redness, swelling, or pain. ? Fluid or blood. ? Warmth. ? Pus or a bad smell. Activity  Return to your normal activities as told by your health care provider. Ask your health care provider what activities are safe for you.  Do not lift anything that is heavier than 10 lb (4.5 kg), or the limit that you are told, until your health care provider says that it is safe.  Do not drive for 24 hours if you were given a sedative during your procedure. General instructions   Take over-the-counter and prescription medicines only as told by your health care provider.  Do not take baths, swim, or use a hot tub until your health care provider approves. Ask your health care provider if you may take showers. You may only be allowed to take sponge baths.  If directed, put ice on the affected area. To do this: ? Put ice in a plastic bag. ? Place a towel between your skin and the bag. ? Leave the ice on for 20 minutes, 2-3 times a day.  Keep all follow-up visits as told by your health care provider. This is important. Contact a  health care provider if:  Your pain is not controlled with medicine.  You have a fever.  You have more redness, swelling, or pain around the puncture site.  You have fluid or blood coming from the puncture site.  Your puncture site feels warm to the touch.  You have pus or a bad smell coming from the puncture site. Summary  After the procedure, it is common to have mild pain, tenderness, swelling, and bruising.  Follow instructions from your health care provider about how to take care of the puncture site and what activities are safe for you.  Take over-the-counter and prescription medicines only as told by your health care provider.  Contact a health care provider if you have any signs of infection, such as fluid or blood coming from the puncture site. This information is not intended to replace advice given to you by your health care provider. Make sure you discuss any questions you have with your health care provider. Document Revised: 11/21/2018 Document Reviewed: 11/21/2018 Elsevier Patient Education  2020 [employer]  

## 2020-06-25 NOTE — Procedures (Signed)
Pre-procedure Diagnosis: Anemia Post-procedure Diagnosis: Same  Technically successful CT guided bone marrow aspiration and biopsy of left iliac crest.   Complications: None Immediate  EBL: None  Signed: Sandi Mariscal Pager: [phone] 06/25/2020, 10:44 AM

## 2020-06-25 NOTE — Sedation Documentation (Signed)
Total conscious sedation: Versed 0.5 mg IV, Fentanyl 25 mcg IV. Pt. Tolerated procedure well without any respiratory distress.

## 2020-06-26 LAB — SURGICAL PATHOLOGY

## 2020-06-27 LAB — SURGICAL PATHOLOGY

## 2020-06-28 NOTE — Progress Notes (Signed)
[place]  [phone] [phone]  ID: [name] OB: 1945-10-28  MR#: [id number]  YDX#:[id number]  Patient Care Team: [name], MD as PCP - General (Internal Medicine)  CHIEF COMPLAINT: Anemia, unspecified.  INTERVAL HISTORY: Patient returns to clinic today for further evaluation and discussion of his bone marrow biopsy results.  He continues to have chronic weakness and fatigue, but otherwise feels well. He has no neurologic complaints.  He denies any recent fevers or illnesses.  He has a good appetite and denies weight loss.  He has chronic shortness of breath requiring oxygen, but denies any chest pain, cough, or hemoptysis.  He denies any nausea, vomiting, constipation, or diarrhea.  He has no melena or hematochezia.  He has no urinary complaints.  Patient feels at his baseline offers no further specific complaints today.  REVIEW OF SYSTEMS:   Review of Systems  Constitutional: Positive for malaise/fatigue. Negative for fever and weight loss.  Respiratory: Positive for shortness of breath. Negative for cough and hemoptysis.   Cardiovascular: Negative.  Negative for chest pain and leg swelling.  Gastrointestinal: Negative.  Negative for abdominal pain, blood in stool and melena.  Genitourinary: Negative.  Negative for hematuria.  Musculoskeletal: Negative.  Negative for back pain.  Skin: Negative.  Negative for rash.  Neurological: Positive for weakness. Negative for dizziness, focal weakness and headaches.  Psychiatric/Behavioral: Negative.  The patient is not nervous/anxious.     As per HPI. Otherwise, a complete review of systems is negative.  PAST MEDICAL HISTORY: Past Medical History:  Diagnosis Date  . Asthma   . Atrial fibrillation ([place])   . BPH (benign prostatic hyperplasia)   . COPD (chronic obstructive pulmonary disease) ([place])   . Diabetes mellitus without complication ([place])   . Helicobacter pylori gastritis   .  Hyperlipidemia   . Hypertension   . Pneumonia   . Right heart failure ([place])     PAST SURGICAL HISTORY: Past Surgical History:  Procedure Laterality Date  . BACK SURGERY     x3 L3-L4   . CARDIAC ELECTROPHYSIOLOGY MAPPING AND ABLATION    . CARDIOVERSION N/A 06/22/2019   Procedure: CARDIOVERSION;  Surgeon: Fay Records, MD;  Location: [place];  Service: Cardiovascular;  Laterality: N/A;  . COLONOSCOPY WITH PROPOFOL N/A 06/15/2017   Procedure: COLONOSCOPY WITH PROPOFOL;  Surgeon: Toledo, Benay Pike, MD;  Location: [facility] ENDOSCOPY;  Service: Gastroenterology;  Laterality: N/A;  . ENTEROSCOPY N/A 11/24/2017   Procedure: ENTEROSCOPY;  Surgeon: Jonathon Bellows, MD;  Location: [facility] ENDOSCOPY;  Service: Gastroenterology;  Laterality: N/A;  . ESOPHAGOGASTRODUODENOSCOPY N/A 11/27/2017   Procedure: ESOPHAGOGASTRODUODENOSCOPY (EGD);  Surgeon: Lucilla Lame, MD;  Location: [facility] ENDOSCOPY;  Service: Endoscopy;  Laterality: N/A;  . ESOPHAGOGASTRODUODENOSCOPY (EGD) WITH PROPOFOL N/A 06/02/2017   Procedure: ESOPHAGOGASTRODUODENOSCOPY (EGD) WITH PROPOFOL;  Surgeon: Toledo, Benay Pike, MD;  Location: [facility] ENDOSCOPY;  Service: Gastroenterology;  Laterality: N/A;  . GIVENS CAPSULE STUDY N/A 11/25/2017   Procedure: GIVENS CAPSULE STUDY;  Surgeon: Jonathon Bellows, MD;  Location: [facility] ENDOSCOPY;  Service: Gastroenterology;  Laterality: N/A;  . HAND SURGERY    . RIGHT/LEFT HEART CATH AND CORONARY ANGIOGRAPHY N/A 01/17/2020   Procedure: RIGHT/LEFT HEART CATH AND CORONARY ANGIOGRAPHY;  Surgeon: Corey Skains, MD;  Location: [place] CV LAB;  Service: Cardiovascular;  Laterality: N/A;    FAMILY HISTORY: Family History  Problem Relation Age of Onset  . Heart disease Mother   . Cancer Father   . Cancer Paternal Grandfather  ADVANCED DIRECTIVES (Y/N):  N  HEALTH MAINTENANCE: Social History   Tobacco Use  . Smoking status: Former Smoker    Packs/day: 1.00    Years: 30.00    Pack years: 30.00    Types:  Cigarettes    Quit date: 1995    Years since quitting: 26.9  . Smokeless tobacco: Never Used  Vaping Use  . Vaping Use: Never used  Substance Use Topics  . Alcohol use: No    Alcohol/week: 0.0 standard drinks    Comment: occasional  . Drug use: No     Colonoscopy:  PAP:  Bone density:  Lipid panel:  Allergies  Allergen Reactions  . Shrimp [Shellfish Allergy] Anaphylaxis  . Shrimp Extract Allergy Skin Test     Current Outpatient Medications  Medication Sig Dispense Refill  . acetaminophen (TYLENOL) 500 MG tablet Take 1,000 mg by mouth every 6 (six) hours as needed for moderate pain or headache.    . albuterol (VENTOLIN HFA) 108 (90 Base) MCG/ACT inhaler Inhale 2 puffs into the lungs daily.    Marland Kitchen allopurinol (ZYLOPRIM) 100 MG tablet Take 100 mg by mouth daily.    Marland Kitchen ascorbic acid (VITAMIN C) 500 MG tablet Take 500 mg by mouth daily.    Marland Kitchen atorvastatin (LIPITOR) 20 MG tablet Take 20 mg by mouth at bedtime.     Marland Kitchen BREO ELLIPTA 100-25 MCG/INH AEPB Inhale 1 puff into the lungs daily.    . Cholecalciferol (DIALYVITE VITAMIN D 5000) 125 MCG (5000 UT) capsule Take 5,000 Units by mouth daily.    Marland Kitchen ELIQUIS 5 MG TABS tablet SMARTSIG:1 Tablet(s) By Mouth Every 12 Hours    . furosemide (LASIX) 80 MG tablet Take 40 mg by mouth daily.     Marland Kitchen HYDROcodone-acetaminophen (NORCO/VICODIN) 5-325 MG tablet TAKE ONE TABLET BY MOUTH EVERY 6 HOURS AS NEEDED FOR PAIN FOR UP TO 30 DAYS    . insulin aspart (NOVOLOG) 100 UNIT/ML FlexPen Inject 14-26 Units into the skin 3 (three) times daily with meals.     . insulin degludec (TRESIBA FLEXTOUCH) 200 UNIT/ML FlexTouch Pen Inject 46 Units into the skin at bedtime.    . Insulin Pen Needle 31G X 8 MM MISC BD Ultra-Fine Short Pen Needle 31 gauge x 5/16"    . lisinopril (ZESTRIL) 20 MG tablet Take 10 mg by mouth at bedtime.     . Magnesium Oxide 400 (240 Mg) MG TABS Take 400 mg by mouth in the morning, at noon, and at bedtime.    . metFORMIN (GLUCOPHAGE) 1000 MG  tablet Take 1,000 mg by mouth 2 (two) times daily.    . metoprolol tartrate (LOPRESSOR) 25 MG tablet Take 50 mg by mouth 2 (two) times daily.     . pantoprazole (PROTONIX) 40 MG tablet Take 1 tablet (40 mg total) by mouth 2 (two) times daily before a meal. 60 tablet 0  . sildenafil (REVATIO) 20 MG tablet Take 20 mg by mouth 3 (three) times daily.    . tamsulosin (FLOMAX) 0.4 MG CAPS capsule TAKE TWO CAPSULES EVERY EVENING 180 capsule 0  . TYVASO REFILL 0.6 MG/ML SOLN Inhale 0.6 mg into the lungs in the morning, at noon, in the evening, and at bedtime.    . vitamin B-12 (CYANOCOBALAMIN) 1000 MCG tablet Take 1,000 mcg by mouth daily.    . colchicine 0.6 MG tablet Take 0.6 mg by mouth daily as needed (gout flare). (Patient not taking: No sig reported)    . sodium chloride (OCEAN)  0.65 % SOLN nasal spray Place 1 spray into both nostrils as needed for congestion. (Patient not taking: No sig reported)     No current facility-administered medications for this visit.    OBJECTIVE: Vitals:   07/03/20 0952  BP: (!) 113/49  Pulse: 66  Temp: 98.7 F (37.1 C)  SpO2: 93%     Body mass index is 28.71 kg/m.    ECOG FS:1 - Symptomatic but completely ambulatory  General: Well-developed, well-nourished, no acute distress. Eyes: Pink conjunctiva, anicteric sclera. HEENT: Normocephalic, moist mucous membranes. Lungs: No audible wheezing or coughing. Heart: Regular rate and rhythm. Abdomen: Soft, nontender, no obvious distention. Musculoskeletal: No edema, cyanosis, or clubbing. Neuro: Alert, answering all questions appropriately. Cranial nerves grossly intact. Skin: No rashes or petechiae noted. Psych: Normal affect.  LAB RESULTS:  Lab Results  Component Value Date   NA 136 06/25/2020   K 4.6 06/25/2020   CL 101 06/25/2020   CO2 24 06/25/2020   GLUCOSE 119 (H) 06/25/2020   BUN 31 (H) 06/25/2020   CREATININE 2.26 (H) 06/25/2020   CALCIUM 9.2 06/25/2020   PROT 5.3 (L) 06/23/2019   ALBUMIN  2.1 (L) 06/23/2019   AST 27 06/23/2019   ALT 26 06/23/2019   ALKPHOS 31 (L) 06/23/2019   BILITOT 1.4 (H) 06/23/2019   GFRNONAA 29 (L) 06/25/2020   GFRAA 39 (L) 10/18/2019    Lab Results  Component Value Date   WBC 8.0 07/03/2020   NEUTROABS 5.4 07/03/2020   HGB 8.3 (L) 07/03/2020   HCT 27.5 (L) 07/03/2020   MCV 92.0 07/03/2020   PLT 283 07/03/2020   Lab Results  Component Value Date   IRON 32 (L) 07/03/2020   TIBC 403 07/03/2020   IRONPCTSAT 8 (L) 07/03/2020   Lab Results  Component Value Date   FERRITIN 34 07/03/2020     STUDIES: CT BONE MARROW BIOPSY & ASPIRATION  Result Date: 06/25/2020 INDICATION: Symptomatic anemia. 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 EXAM: CT-GUIDED BONE MARROW BIOPSY AND ASPIRATION MEDICATIONS: None ANESTHESIA/SEDATION: Fentanyl 25 mcg IV; Versed 0.5 mg IV Sedation Time: 10 Minutes; The patient was continuously monitored during the procedure by the interventional radiology nurse under my direct supervision. COMPLICATIONS: None immediate. PROCEDURE: Informed consent was obtained from the patient following an explanation of the procedure, risks, benefits and alternatives. The patient understands, agrees and consents for the procedure. All questions were addressed. A time out was performed prior to the initiation of the procedure. The patient was positioned left lateral decubitus and non-contrast localization CT was performed of the pelvis to demonstrate the iliac marrow spaces. The operative site was prepped and draped in the usual sterile fashion. Under sterile conditions and local anesthesia, a 22 gauge spinal needle was utilized for procedural planning. Next, an 11 gauge coaxial bone biopsy needle was advanced into the left iliac marrow space. Needle position was confirmed with CT imaging. Initially, a bone marrow aspiration was performed. Next, a bone marrow biopsy was obtained with the 11 gauge outer bone marrow device. The  needle was removed and superficial hemostasis was obtained with manual compression. A dressing was applied. The patient tolerated the procedure well without immediate post procedural complication. IMPRESSION: Successful CT guided left iliac bone marrow aspiration and core biopsy. Electronically Signed   By: Sandi Mariscal M.D.   On: 06/25/2020 11:16    ASSESSMENT: Anemia, unspecified.  PLAN:   1. Anemia, unspecified: Bone marrow biopsy completed on June 25, 2020 was essentially  negative only with a hypercellular marrow for age.  Patient was noted to have a possibly of iron storage and rare ringed sideroblasts.  FISH and cytogenetics were negative. Repeat iron stores were found to be decreased.  Repeat B12, folate, and hemolysis labs were found to be either negative or within normal limits.  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  Prior to receiving additional Retacrit, will give patient 5 infusions of 200 mg IV Venofer.  Return to clinic in 3 months with repeat laboratory work, further evaluation, and consideration of Venofer or Retacrit.   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  Kappa free light chains are only mildly elevated and immunoglobulins are within normal limits.  Bone marrow biopsy only revealed approximately 1% plasma cells.  This is likely clinically insignificant, but will continue to monitor on a yearly basis. 3.  Chronic renal insufficiency.  Patient's most recent creatinine was 2.26.  Continue follow-up with nephrology as indicated.     I spent a total of 30 minutes reviewing chart data, face-to-face evaluation with the patient, counseling and coordination of care as detailed above.   Patient expressed understanding and was in agreement with this plan. He also understands that He can call clinic at any time with any questions, concerns, or complaints.    Lloyd Huger, MD   07/04/2020 8:23  AM

## 2020-07-03 ENCOUNTER — Encounter (HOSPITAL_COMMUNITY): Payer: Self-pay | Admitting: Oncology

## 2020-07-03 ENCOUNTER — Other Ambulatory Visit: Payer: Self-pay

## 2020-07-03 ENCOUNTER — Inpatient Hospital Stay: Payer: Medicare HMO | Attending: Oncology | Admitting: Oncology

## 2020-07-03 ENCOUNTER — Encounter: Payer: Self-pay | Admitting: Oncology

## 2020-07-03 ENCOUNTER — Inpatient Hospital Stay: Payer: Medicare HMO

## 2020-07-03 VITALS — BP 113/49 | HR 66 | Temp 98.7°F | Wt 188.8 lb

## 2020-07-03 DIAGNOSIS — E611 Iron deficiency: Secondary | ICD-10-CM | POA: Diagnosis not present

## 2020-07-03 DIAGNOSIS — D472 Monoclonal gammopathy: Secondary | ICD-10-CM | POA: Insufficient documentation

## 2020-07-03 DIAGNOSIS — D649 Anemia, unspecified: Secondary | ICD-10-CM | POA: Diagnosis not present

## 2020-07-03 DIAGNOSIS — Z79899 Other long term (current) drug therapy: Secondary | ICD-10-CM | POA: Diagnosis not present

## 2020-07-03 DIAGNOSIS — N189 Chronic kidney disease, unspecified: Secondary | ICD-10-CM | POA: Insufficient documentation

## 2020-07-03 LAB — CBC WITH DIFFERENTIAL/PLATELET
Abs Immature Granulocytes: 0.02 10*3/uL (ref 0.00–0.07)
Basophils Absolute: 0.1 10*3/uL (ref 0.0–0.1)
Basophils Relative: 1 %
Eosinophils Absolute: 0.2 10*3/uL (ref 0.0–0.5)
Eosinophils Relative: 3 %
HCT: 27.5 % — ABNORMAL LOW (ref 39.0–52.0)
Hemoglobin: 8.3 g/dL — ABNORMAL LOW (ref 13.0–17.0)
Immature Granulocytes: 0 %
Lymphocytes Relative: 21 %
Lymphs Abs: 1.7 10*3/uL (ref 0.7–4.0)
MCH: 27.8 pg (ref 26.0–34.0)
MCHC: 30.2 g/dL (ref 30.0–36.0)
MCV: 92 fL (ref 80.0–100.0)
Monocytes Absolute: 0.7 10*3/uL (ref 0.1–1.0)
Monocytes Relative: 9 %
Neutro Abs: 5.4 10*3/uL (ref 1.7–7.7)
Neutrophils Relative %: 66 %
Platelets: 283 10*3/uL (ref 150–400)
RBC: 2.99 MIL/uL — ABNORMAL LOW (ref 4.22–5.81)
RDW: 17.2 % — ABNORMAL HIGH (ref 11.5–15.5)
WBC: 8 10*3/uL (ref 4.0–10.5)
nRBC: 0 % (ref 0.0–0.2)

## 2020-07-03 LAB — FERRITIN: Ferritin: 34 ng/mL (ref 24–336)

## 2020-07-03 LAB — IRON AND TIBC
Iron: 32 ug/dL — ABNORMAL LOW (ref 45–182)
Saturation Ratios: 8 % — ABNORMAL LOW (ref 17.9–39.5)
TIBC: 403 ug/dL (ref 250–450)
UIBC: 371 ug/dL

## 2020-07-03 LAB — FOLATE: Folate: 9.4 ng/mL (ref >5.9)

## 2020-07-03 LAB — LACTATE DEHYDROGENASE: LDH: 116 U/L (ref 98–192)

## 2020-07-03 LAB — VITAMIN B12: Vitamin B-12: 943 pg/mL — ABNORMAL HIGH (ref 180–914)

## 2020-07-09 ENCOUNTER — Inpatient Hospital Stay: Payer: Medicare HMO

## 2020-07-15 ENCOUNTER — Inpatient Hospital Stay: Payer: Medicare HMO

## 2020-07-15 ENCOUNTER — Other Ambulatory Visit: Payer: Self-pay

## 2020-07-15 VITALS — BP 135/65 | HR 60 | Temp 97.1°F | Resp 16

## 2020-07-15 DIAGNOSIS — D649 Anemia, unspecified: Secondary | ICD-10-CM

## 2020-07-15 DIAGNOSIS — N189 Chronic kidney disease, unspecified: Secondary | ICD-10-CM | POA: Diagnosis not present

## 2020-07-15 MED ORDER — SODIUM CHLORIDE 0.9 % IV SOLN
Freq: Once | INTRAVENOUS | Status: AC
  Filled 2020-07-15: qty 250

## 2020-07-15 MED ORDER — IRON SUCROSE 20 MG/ML IV SOLN
200.0000 mg | Freq: Once | INTRAVENOUS | Status: AC
  Administered 2020-07-15: 200 mg via INTRAVENOUS
  Filled 2020-07-15: qty 10

## 2020-07-15 MED ORDER — SODIUM CHLORIDE 0.9 % IV SOLN: 200.0000 mg | Freq: Once | INTRAVENOUS | Status: DC

## 2020-07-15 NOTE — Progress Notes (Signed)
Venofer well tolerated. Discharged home in stable condition.

## 2020-07-22 ENCOUNTER — Other Ambulatory Visit: Payer: Self-pay

## 2020-07-22 ENCOUNTER — Inpatient Hospital Stay: Payer: Medicare HMO | Attending: Oncology

## 2020-07-22 VITALS — BP 136/53 | HR 70 | Temp 97.9°F | Resp 16

## 2020-07-22 DIAGNOSIS — D649 Anemia, unspecified: Secondary | ICD-10-CM | POA: Diagnosis present

## 2020-07-22 DIAGNOSIS — R790 Abnormal level of blood mineral: Secondary | ICD-10-CM | POA: Diagnosis not present

## 2020-07-22 DIAGNOSIS — N189 Chronic kidney disease, unspecified: Secondary | ICD-10-CM | POA: Diagnosis present

## 2020-07-22 MED ORDER — IRON SUCROSE 20 MG/ML IV SOLN
200.0000 mg | Freq: Once | INTRAVENOUS | Status: AC
  Administered 2020-07-22: 200 mg via INTRAVENOUS
  Filled 2020-07-22: qty 10

## 2020-07-22 MED ORDER — SODIUM CHLORIDE 0.9 % IV SOLN: 200.0000 mg | Freq: Once | INTRAVENOUS | Status: DC

## 2020-07-22 MED ORDER — SODIUM CHLORIDE 0.9 % IV SOLN
Freq: Once | INTRAVENOUS | Status: AC
  Filled 2020-07-22: qty 250

## 2020-07-22 NOTE — Progress Notes (Signed)
Mr. Laker tolerated his Venofer infusion without any complications.

## 2020-07-24 ENCOUNTER — Inpatient Hospital Stay: Payer: Medicare HMO

## 2020-07-24 ENCOUNTER — Other Ambulatory Visit: Payer: Self-pay

## 2020-07-24 VITALS — BP 136/56 | HR 63 | Temp 99.1°F | Resp 18

## 2020-07-24 DIAGNOSIS — D649 Anemia, unspecified: Secondary | ICD-10-CM

## 2020-07-24 DIAGNOSIS — N189 Chronic kidney disease, unspecified: Secondary | ICD-10-CM | POA: Diagnosis not present

## 2020-07-24 MED ORDER — SODIUM CHLORIDE 0.9 % IV SOLN: 200.0000 mg | Freq: Once | INTRAVENOUS | Status: DC

## 2020-07-24 MED ORDER — SODIUM CHLORIDE 0.9 % IV SOLN
Freq: Once | INTRAVENOUS | Status: AC
  Filled 2020-07-24: qty 250

## 2020-07-24 MED ORDER — IRON SUCROSE 20 MG/ML IV SOLN
200.0000 mg | Freq: Once | INTRAVENOUS | Status: AC
  Administered 2020-07-24: 200 mg via INTRAVENOUS
  Filled 2020-07-24: qty 10

## 2020-07-28 ENCOUNTER — Other Ambulatory Visit: Payer: Self-pay

## 2020-07-28 ENCOUNTER — Inpatient Hospital Stay: Payer: Medicare HMO

## 2020-07-28 VITALS — BP 121/71 | HR 69 | Temp 97.5°F

## 2020-07-28 DIAGNOSIS — N189 Chronic kidney disease, unspecified: Secondary | ICD-10-CM | POA: Diagnosis not present

## 2020-07-28 DIAGNOSIS — D649 Anemia, unspecified: Secondary | ICD-10-CM

## 2020-07-28 MED ORDER — EPOETIN ALFA-EPBX 40000 UNIT/ML IJ SOLN: 40000.0000 [IU] | Freq: Once | INTRAMUSCULAR | Status: DC

## 2020-07-28 MED ORDER — IRON SUCROSE 20 MG/ML IV SOLN
200.0000 mg | Freq: Once | INTRAVENOUS | Status: AC
  Administered 2020-07-28: 200 mg via INTRAVENOUS
  Filled 2020-07-28: qty 10

## 2020-07-28 MED ORDER — SODIUM CHLORIDE 0.9 % IV SOLN
Freq: Once | INTRAVENOUS | Status: AC
Start: 2020-07-28 — End: 2020-07-28
  Filled 2020-07-28: qty 250

## 2020-07-28 MED ORDER — SODIUM CHLORIDE 0.9 % IV SOLN: 200.0000 mg | Freq: Once | INTRAVENOUS | Status: DC

## 2020-07-30 ENCOUNTER — Other Ambulatory Visit: Payer: Self-pay

## 2020-07-30 ENCOUNTER — Ambulatory Visit (INDEPENDENT_AMBULATORY_CARE_PROVIDER_SITE_OTHER): Payer: Medicare HMO | Admitting: Physician Assistant

## 2020-07-30 VITALS — BP 132/65 | HR 85 | Ht 68.0 in | Wt 188.0 lb

## 2020-07-30 DIAGNOSIS — R338 Other retention of urine: Secondary | ICD-10-CM

## 2020-07-30 DIAGNOSIS — N401 Enlarged prostate with lower urinary tract symptoms: Secondary | ICD-10-CM

## 2020-07-30 LAB — BLADDER SCAN AMB NON-IMAGING

## 2020-07-30 MED ORDER — FINASTERIDE 5 MG PO TABS: 5.0000 mg | ORAL_TABLET | Freq: Every day | ORAL | 11 refills | Status: DC

## 2020-07-30 NOTE — Patient Instructions (Signed)
STOP Flomax. CONTINUE finasteride. INCREASE self-catheterization to three times daily.

## 2020-07-30 NOTE — Progress Notes (Signed)
07/30/2020 1:43 PM   Jay Morris 05-01-1946 [id number]  CC: Chief Complaint  Patient presents with  . Benign Prostatic Hypertrophy   HPI: Jay Morris is a 75 y.o. male with BPH with urinary retention managed by CIC on Flomax and finasteride who presents today for follow-up and repeat PVR.  He is accompanied today by his wife, who contributes to HPI.  Patient reports twice daily CIC performed by his wife.  He typically has 500 to 600 mL of urinary output with catheterization, rarely up to a maximum of 900 mL.  Patient is only voiding spontaneously once daily now, previously approximately 3 times daily.  He continues to take Flomax and finasteride, but does not believe that this is helping as he notes he is having more difficulty with spontaneous voids.  Additionally, patient reports retrograde ejaculation.  His wife typically sees cloudy urine with CIC following an ejaculation and this has been concerning her for possible infection.  He denies dysuria, lower abdominal pain, low back pain, fever, chills, nausea, vomiting, and gross hematuria in the last 6 months.  Bladder scan with 308 mL today, last catheterization 3 hours ago.  He was unable to void in office today.  PMH: Past Medical History:  Diagnosis Date  . Asthma   . Atrial fibrillation ([place])   . BPH (benign prostatic hyperplasia)   . COPD (chronic obstructive pulmonary disease) ([place])   . Diabetes mellitus without complication ([place])   . Helicobacter pylori gastritis   . Hyperlipidemia   . Hypertension   . Pneumonia   . Right heart failure [facility])     Surgical History: Past Surgical History:  Procedure Laterality Date  . BACK SURGERY     x3 L3-L4   . CARDIAC ELECTROPHYSIOLOGY MAPPING AND ABLATION    . CARDIOVERSION N/A 06/22/2019   Procedure: CARDIOVERSION;  Surgeon: Fay Records, MD;  Location: [place];  Service: Cardiovascular;  Laterality: N/A;  . COLONOSCOPY WITH PROPOFOL N/A 06/15/2017   Procedure:  COLONOSCOPY WITH PROPOFOL;  Surgeon: Toledo, Benay Pike, MD;  Location: [facility] ENDOSCOPY;  Service: Gastroenterology;  Laterality: N/A;  . ENTEROSCOPY N/A 11/24/2017   Procedure: ENTEROSCOPY;  Surgeon: Jonathon Bellows, MD;  Location: [facility] ENDOSCOPY;  Service: Gastroenterology;  Laterality: N/A;  . ESOPHAGOGASTRODUODENOSCOPY N/A 11/27/2017   Procedure: ESOPHAGOGASTRODUODENOSCOPY (EGD);  Surgeon: Lucilla Lame, MD;  Location: [facility] ENDOSCOPY;  Service: Endoscopy;  Laterality: N/A;  . ESOPHAGOGASTRODUODENOSCOPY (EGD) WITH PROPOFOL N/A 06/02/2017   Procedure: ESOPHAGOGASTRODUODENOSCOPY (EGD) WITH PROPOFOL;  Surgeon: Toledo, Benay Pike, MD;  Location: [facility] ENDOSCOPY;  Service: Gastroenterology;  Laterality: N/A;  . GIVENS CAPSULE STUDY N/A 11/25/2017   Procedure: GIVENS CAPSULE STUDY;  Surgeon: Jonathon Bellows, MD;  Location: [facility] ENDOSCOPY;  Service: Gastroenterology;  Laterality: N/A;  . HAND SURGERY    . RIGHT/LEFT HEART CATH AND CORONARY ANGIOGRAPHY N/A 01/17/2020   Procedure: RIGHT/LEFT HEART CATH AND CORONARY ANGIOGRAPHY;  Surgeon: Corey Skains, MD;  Location: [place] CV LAB;  Service: Cardiovascular;  Laterality: N/A;    Home Medications:  Allergies as of 07/30/2020      Reactions   Shrimp [shellfish Allergy] Anaphylaxis   Shrimp Extract Allergy Skin Test       Medication List       Accurate as of July 30, 2020  1:43 PM. If you have any questions, ask your nurse or doctor.        STOP taking these medications   tamsulosin 0.4 MG Caps capsule Commonly known as: FLOMAX Stopped by: Debroah Loop,  PA-C     TAKE these medications   acetaminophen 500 MG tablet Commonly known as: TYLENOL Take 1,000 mg by mouth every 6 (six) hours as needed for moderate pain or headache.   albuterol 108 (90 Base) MCG/ACT inhaler Commonly known as: VENTOLIN HFA Inhale 2 puffs into the lungs daily.   allopurinol 100 MG tablet Commonly known as: ZYLOPRIM Take 100 mg by mouth daily.   ascorbic  acid 500 MG tablet Commonly known as: VITAMIN C Take 500 mg by mouth daily.   atorvastatin 20 MG tablet Commonly known as: LIPITOR Take 20 mg by mouth at bedtime.   Breo Ellipta 100-25 MCG/INH Aepb Generic drug: fluticasone furoate-vilanterol Inhale 1 puff into the lungs daily.   colchicine 0.6 MG tablet Take 0.6 mg by mouth daily as needed (gout flare).   Dialyvite Vitamin D 5000 125 MCG (5000 UT) capsule Generic drug: Cholecalciferol Take 5,000 Units by mouth daily.   Eliquis 5 MG Tabs tablet Generic drug: apixaban SMARTSIG:1 Tablet(s) By Mouth Every 12 Hours   finasteride 5 MG tablet Commonly known as: PROSCAR Take 1 tablet (5 mg total) by mouth daily.   furosemide 80 MG tablet Commonly known as: LASIX Take 40 mg by mouth daily.   HYDROcodone-acetaminophen 5-325 MG tablet Commonly known as: NORCO/VICODIN TAKE ONE TABLET BY MOUTH EVERY 6 HOURS AS NEEDED FOR PAIN FOR UP TO 30 DAYS   insulin aspart 100 UNIT/ML FlexPen Commonly known as: NOVOLOG Inject 14-26 Units into the skin 3 (three) times daily with meals.   Insulin Pen Needle 31G X 8 MM Misc BD Ultra-Fine Short Pen Needle 31 gauge x 5/16"   lisinopril 20 MG tablet Commonly known as: ZESTRIL Take 10 mg by mouth at bedtime.   Magnesium Oxide 400 (240 Mg) MG Tabs Take 400 mg by mouth in the morning, at noon, and at bedtime.   magnesium oxide 400 MG tablet Commonly known as: MAG-OX Take 1 tablet by mouth 3 (three) times daily.   metFORMIN 1000 MG tablet Commonly known as: GLUCOPHAGE Take 1,000 mg by mouth 2 (two) times daily.   metoprolol tartrate 25 MG tablet Commonly known as: LOPRESSOR Take 50 mg by mouth 2 (two) times daily.   pantoprazole 40 MG tablet Commonly known as: PROTONIX Take 1 tablet (40 mg total) by mouth 2 (two) times daily before a meal.   sildenafil 20 MG tablet Commonly known as: REVATIO Take 20 mg by mouth 3 (three) times daily.   sodium chloride 0.65 % Soln nasal  spray Commonly known as: OCEAN Place 1 spray into both nostrils as needed for congestion.   Tyler Aas FlexTouch 200 UNIT/ML FlexTouch Pen Generic drug: insulin degludec Inject 46 Units into the skin at bedtime.   Tyvaso Refill 0.6 MG/ML Soln Generic drug: Treprostinil Inhale 0.6 mg into the lungs in the morning, at noon, in the evening, and at bedtime.   vitamin B-12 1000 MCG tablet Commonly known as: CYANOCOBALAMIN Take 1,000 mcg by mouth daily.       Allergies:  Allergies  Allergen Reactions  . Shrimp [Shellfish Allergy] Anaphylaxis  . Shrimp Extract Allergy Skin Test     Family History: Family History  Problem Relation Age of Onset  . Heart disease Mother   . Cancer Father   . Cancer Paternal Grandfather     Social History:   reports that he quit smoking about 27 years ago. His smoking use included cigarettes. He has a 30.00 pack-year smoking history. He has never used smokeless  tobacco. He reports that he does not drink alcohol and does not use drugs.  Physical Exam: BP 132/65   Pulse 85   Ht _0  (1.727 m)   Wt 188 lb (85.3 kg)   BMI 28.59 kg/m   Constitutional:  Alert and oriented, no acute distress, nontoxic appearing HEENT: [place], AT Cardiovascular: No clubbing, cyanosis, or edema Respiratory: Normal respiratory effort, no increased work of breathing, on supplemental oxygen Skin: No rashes, bruises or suspicious lesions Neurologic: Grossly intact, no focal deficits, moving all 4 extremities Psychiatric: Normal mood and affect  Laboratory Data: Results for orders placed or performed in visit on 07/30/20  Bladder Scan (Post Void Residual) in office  Result Value Ref Range   Scan Result 364m    Assessment & Plan:   1. Benign prostatic hyperplasia with urinary retention Worsened spontaneous voiding in the past 6 months with increasing output with CIC.  Counseled patient increased frequency of self-catheterization to 3 times daily to protect his upper  tracts.  He and wife expressed understanding.  Explained that retrograde ejaculation is a side effect of Flomax and is likely the source of the cloudiness in his urine.  Low suspicion for infection in the absence of other infective symptoms.  Okay to discontinue Flomax at this time, but recommend continuing finasteride to decrease his risk of prostatic bleeding associated with CIC.  Patient is in agreement with this plan.  We will adjust his Coloplast order today to provide for 3 times daily catheterization.  Samples also provided in clinic today. - Bladder Scan (Post Void Residual) in office - finasteride (PROSCAR) 5 MG tablet; Take 1 tablet (5 mg total) by mouth daily.  Dispense: 30 tablet; Refill: 11  Return in about 6 months (around 01/27/2021) for Symptom recheck with PVR.  SDebroah Loop PA-C  [facility] [address] [place] [phone]

## 2020-08-04 ENCOUNTER — Inpatient Hospital Stay: Payer: Medicare HMO

## 2020-08-06 ENCOUNTER — Other Ambulatory Visit: Payer: Self-pay

## 2020-08-06 ENCOUNTER — Inpatient Hospital Stay: Payer: Medicare HMO

## 2020-08-06 VITALS — BP 150/71 | HR 60 | Temp 97.0°F | Resp 19

## 2020-08-06 DIAGNOSIS — N189 Chronic kidney disease, unspecified: Secondary | ICD-10-CM | POA: Diagnosis not present

## 2020-08-06 DIAGNOSIS — D649 Anemia, unspecified: Secondary | ICD-10-CM

## 2020-08-06 MED ORDER — IRON SUCROSE 20 MG/ML IV SOLN
200.0000 mg | Freq: Once | INTRAVENOUS | Status: AC
  Administered 2020-08-06: 200 mg via INTRAVENOUS
  Filled 2020-08-06: qty 10

## 2020-08-06 MED ORDER — SODIUM CHLORIDE 0.9 % IV SOLN
Freq: Once | INTRAVENOUS | Status: AC
  Filled 2020-08-06: qty 250

## 2020-08-06 MED ORDER — SODIUM CHLORIDE 0.9 % IV SOLN: 200.0000 mg | Freq: Once | INTRAVENOUS | Status: DC

## 2020-08-06 NOTE — Progress Notes (Signed)
Pt tolerated infusion well with no complications. VSS. Pt stable for discharge. Pt escorted via wheelchair to be transported home by wife.   Jay Morris [employer]

## 2020-08-12 ENCOUNTER — Telehealth: Payer: Self-pay | Admitting: *Deleted

## 2020-08-12 NOTE — Telephone Encounter (Signed)
Pt wife calling asking for a new rx for cath to be sent to [facility] for 3 times daily instead of 2 times daily.   We will adjust his Coloplast order today to provide for 3 times daily catheterization.  Samples also provided in clinic today. - Bladder Scan (Post Void Residual) in office - finasteride (PROSCAR) 5 MG tablet; Take 1 tablet (5 mg total) by mouth daily.  Dispense: 30 tablet; Refill: 11

## 2020-08-13 NOTE — Telephone Encounter (Signed)
Please adjust order as below.

## 2020-08-14 NOTE — Telephone Encounter (Signed)
New Coloplast order faxed 08/14/20. Patient notified.

## 2020-08-25 ENCOUNTER — Telehealth: Payer: Self-pay

## 2020-08-25 NOTE — Telephone Encounter (Signed)
Patient's wife called stating she is having trouble with [employer] supply for catheters and would like for [place] to reach out and help facilitate with supplies.  Will contact and call back with an update

## 2020-09-11 NOTE — Telephone Encounter (Signed)
Patient's spouse returned call has received supplies enough for 2 times a day.

## 2020-09-11 NOTE — Telephone Encounter (Signed)
Attempted to contact patient to follow up and see if the issue with [employer] was resolved. Left message to return call

## 2020-10-02 ENCOUNTER — Inpatient Hospital Stay: Payer: Medicare HMO | Attending: Oncology

## 2020-10-02 ENCOUNTER — Other Ambulatory Visit: Payer: Self-pay

## 2020-10-02 DIAGNOSIS — N189 Chronic kidney disease, unspecified: Secondary | ICD-10-CM | POA: Diagnosis not present

## 2020-10-02 DIAGNOSIS — D472 Monoclonal gammopathy: Secondary | ICD-10-CM | POA: Insufficient documentation

## 2020-10-02 DIAGNOSIS — D649 Anemia, unspecified: Secondary | ICD-10-CM | POA: Diagnosis present

## 2020-10-02 DIAGNOSIS — Z87891 Personal history of nicotine dependence: Secondary | ICD-10-CM | POA: Diagnosis not present

## 2020-10-02 LAB — HEMOGLOBIN: Hemoglobin: 9.4 g/dL — ABNORMAL LOW (ref 13.0–17.0)

## 2020-10-03 ENCOUNTER — Inpatient Hospital Stay: Payer: Medicare HMO

## 2020-10-03 ENCOUNTER — Inpatient Hospital Stay: Payer: Medicare HMO | Admitting: Oncology

## 2020-10-03 ENCOUNTER — Encounter: Payer: Self-pay | Admitting: Oncology

## 2020-10-03 VITALS — BP 141/57 | HR 62 | Temp 97.8°F | Resp 16 | Wt 184.4 lb

## 2020-10-03 DIAGNOSIS — D649 Anemia, unspecified: Secondary | ICD-10-CM | POA: Diagnosis not present

## 2020-10-03 NOTE — Progress Notes (Signed)
[place]  [phone] [phone]  ID: [name] OB: 02/21/1946  MR#: [id number]  AOZ#:[id number]  Patient Care Team: [name], MD as PCP - General (Internal Medicine)  CHIEF COMPLAINT: Anemia, unspecified.  INTERVAL HISTORY: Patient returns to clinic today for follow-up.  He was last seen in clinic on 07/03/2020.  Had a bone marrow biopsy completed on 06/25/2020 which was essentially negative.  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  He was set up for 5 doses of IV Venofer which he received between 07/15/20-08/06/20.  Appeared to tolerate well.  He presents today for follow-up and lab work.  Patient states he feels tremendously better since receiving his iron.  His wife states she has noticed a huge difference in his energy and his color since the infusions.  Had eye surgery on Monday and has recovered well.  Endorses small amounts of bright red blood when wiping secondary to hemorrhoids.  He also admits to some pain.  They are currently using Preparation H with relief.  This is a chronic problem.   REVIEW OF SYSTEMS:   Review of Systems  Constitutional: Negative.  Negative for chills, fever, malaise/fatigue and weight loss.  HENT: Negative for congestion, ear pain and tinnitus.   Eyes: Negative.  Negative for blurred vision and double vision.  Respiratory: Negative.  Negative for cough, sputum production and shortness of breath.   Cardiovascular: Negative.  Negative for chest pain, palpitations and leg swelling.  Gastrointestinal: Positive for blood in stool (Hemorrhoids). Negative for abdominal pain, constipation, diarrhea, nausea and vomiting.  Genitourinary: Negative for dysuria, frequency and urgency.  Musculoskeletal: Negative for back pain and falls.  Skin: Negative.  Negative for rash.  Neurological: Negative.  Negative for  weakness and headaches.  Endo/Heme/Allergies: Negative.  Does not bruise/bleed easily.  Psychiatric/Behavioral: Negative.  Negative for depression. The patient is not nervous/anxious and does not have insomnia.     As per HPI. Otherwise, a complete review of systems is negative.  PAST MEDICAL HISTORY: Past Medical History:  Diagnosis Date  . Asthma   . Atrial fibrillation ([place])   . BPH (benign prostatic hyperplasia)   . COPD (chronic obstructive pulmonary disease) ([place])   . Diabetes mellitus without complication ([place])   . Helicobacter pylori gastritis   . Hyperlipidemia   . Hypertension   . Pneumonia   . 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 BACK SURGERY     x3 L3-L4   . CARDIAC ELECTROPHYSIOLOGY MAPPING AND ABLATION    . CARDIOVERSION N/A 06/22/2019   Procedure: CARDIOVERSION;  Surgeon: Fay Records, MD;  Location: [facility];  Service: Cardiovascular;  Laterality: N/A;  . COLONOSCOPY WITH PROPOFOL N/A 06/15/2017   Procedure: COLONOSCOPY WITH PROPOFOL;  Surgeon: Toledo, Benay Pike, MD;  Location: [facility] ENDOSCOPY;  Service: Gastroenterology;  Laterality: N/A;  . ENTEROSCOPY N/A 11/24/2017   Procedure: ENTEROSCOPY;  Surgeon: Jonathon Bellows, MD;  Location: [facility] ENDOSCOPY;  Service: Gastroenterology;  Laterality: N/A;  . ESOPHAGOGASTRODUODENOSCOPY N/A 11/27/2017   Procedure: ESOPHAGOGASTRODUODENOSCOPY (EGD);  Surgeon: Lucilla Lame, MD;  Location: [facility] ENDOSCOPY;  Service: Endoscopy;  Laterality: N/A;  . ESOPHAGOGASTRODUODENOSCOPY (EGD) WITH PROPOFOL N/A 06/02/2017   Procedure: ESOPHAGOGASTRODUODENOSCOPY (EGD) WITH PROPOFOL;  Surgeon: Toledo, Benay Pike, MD;  Location: [facility] ENDOSCOPY;  Service: Gastroenterology;  Laterality: N/A;  . GIVENS CAPSULE STUDY N/A  11/25/2017   Procedure: GIVENS CAPSULE STUDY;  Surgeon: Jonathon Bellows, MD;  Location: [facility] ENDOSCOPY;  Service: Gastroenterology;  Laterality: N/A;  . HAND SURGERY    .  RIGHT/LEFT HEART CATH AND CORONARY ANGIOGRAPHY N/A 01/17/2020   Procedure: RIGHT/LEFT HEART CATH AND CORONARY ANGIOGRAPHY;  Surgeon: Corey Skains, MD;  Location: [place] CV LAB;  Service: Cardiovascular;  Laterality: N/A;    FAMILY HISTORY: Family History  Problem Relation Age of Onset  . Heart disease Mother   . Cancer Father   . Cancer Paternal Grandfather     ADVANCED DIRECTIVES (Y/N):  N  HEALTH MAINTENANCE: Social History   Tobacco Use  . Smoking status: Former Smoker    Packs/day: 1.00    Years: 30.00    Pack years: 30.00    Types: Cigarettes    Quit date: 1995    Years since quitting: 27.2  . Smokeless tobacco: Never Used  Vaping Use  . Vaping Use: Never used  Substance Use Topics  . Alcohol use: No    Alcohol/week: 0.0 standard drinks    Comment: occasional  . Drug use: No     Colonoscopy:  PAP:  Bone density:  Lipid panel:  Allergies  Allergen Reactions  . Shrimp [Shellfish Allergy] Anaphylaxis  . Shrimp Extract Allergy Skin Test     Current Outpatient Medications  Medication Sig Dispense Refill  . albuterol (VENTOLIN HFA) 108 (90 Base) MCG/ACT inhaler Inhale 2 puffs into the lungs daily.    Marland Kitchen allopurinol (ZYLOPRIM) 100 MG tablet Take 100 mg by mouth daily.    Marland Kitchen ascorbic acid (VITAMIN C) 500 MG tablet Take 500 mg by mouth daily.    Marland Kitchen atorvastatin (LIPITOR) 20 MG tablet Take 20 mg by mouth at bedtime.     Marland Kitchen BREO ELLIPTA 100-25 MCG/INH AEPB Inhale 1 puff into the lungs daily.    . Cholecalciferol (DIALYVITE VITAMIN D 5000) 125 MCG (5000 UT) capsule Take 5,000 Units by mouth daily.    . colchicine 0.6 MG tablet Take 0.6 mg by mouth daily as needed (gout flare).    Marland Kitchen ELIQUIS 5 MG TABS tablet SMARTSIG:1 Tablet(s) By Mouth Every 12 Hours    . finasteride (PROSCAR) 5 MG tablet Take 1 tablet (5 mg total) by mouth daily. 30 tablet 11  . furosemide (LASIX) 80 MG tablet Take 40 mg by mouth daily.     Marland Kitchen HYDROcodone-acetaminophen (NORCO/VICODIN) 5-325 MG  tablet     . insulin aspart (NOVOLOG) 100 UNIT/ML FlexPen Inject 14-26 Units into the skin 3 (three) times daily with meals.     . insulin degludec (TRESIBA FLEXTOUCH) 200 UNIT/ML FlexTouch Pen Inject 46 Units into the skin at bedtime.    . Insulin Pen Needle 31G X 8 MM MISC BD Ultra-Fine Short Pen Needle 31 gauge x 5/16"    . lisinopril (ZESTRIL) 20 MG tablet Take 10 mg by mouth at bedtime.     . magnesium oxide (MAG-OX) 400 MG tablet Take 1 tablet by mouth 3 (three) times daily.    . Magnesium Oxide 400 (240 Mg) MG TABS Take 400 mg by mouth in the morning, at noon, and at bedtime.    . metFORMIN (GLUCOPHAGE) 1000 MG tablet Take 1,000 mg by mouth 2 (two) times daily.    . metoprolol tartrate (LOPRESSOR) 25 MG tablet Take 50 mg by mouth 2 (two) times daily.     . pantoprazole (PROTONIX) 40 MG tablet Take 1 tablet (40 mg total) by mouth 2 (two)  times daily before a meal. 60 tablet 0  . sildenafil (REVATIO) 20 MG tablet Take 20 mg by mouth 3 (three) times daily.    . sodium chloride (OCEAN) 0.65 % SOLN nasal spray Place 1 spray into both nostrils as needed for congestion.    Marland Kitchen TYVASO REFILL 0.6 MG/ML SOLN Inhale 0.6 mg into the lungs in the morning, at noon, in the evening, and at bedtime.    . vitamin B-12 (CYANOCOBALAMIN) 1000 MCG tablet Take 1,000 mcg by mouth daily.    Marland Kitchen acetaminophen (TYLENOL) 500 MG tablet Take 1,000 mg by mouth every 6 (six) hours as needed for moderate pain or headache.     No current facility-administered medications for this visit.    OBJECTIVE: Vitals:   10/03/20 1251  BP: (!) 141/57  Pulse: 62  Resp: 16  Temp: 97.8 F (36.6 C)  SpO2: 96%     Body mass index is 28.04 kg/m.    ECOG FS:1 - Symptomatic but completely ambulatory  Physical Exam Constitutional:      Appearance: Normal appearance.  HENT:     Head: Normocephalic and atraumatic.  Eyes:     Pupils: Pupils are equal, round, and reactive to light.  Cardiovascular:     Rate and Rhythm: Normal rate  and regular rhythm.     Heart sounds: Normal heart sounds. No murmur heard.   Pulmonary:     Effort: Pulmonary effort is normal.     Breath sounds: Normal breath sounds. No wheezing.  Abdominal:     General: Bowel sounds are normal. There is no distension.     Palpations: Abdomen is soft.     Tenderness: There is no abdominal tenderness.  Musculoskeletal:        General: Normal range of motion.     Cervical back: Normal range of motion.  Skin:    General: Skin is warm and dry.     Findings: No rash.  Neurological:     Mental Status: He is alert and oriented to person, place, and time.  Psychiatric:        Judgment: Judgment normal.     LAB RESULTS:  Lab Results  Component Value Date   NA 136 06/25/2020   K 4.6 06/25/2020   CL 101 06/25/2020   CO2 24 06/25/2020   GLUCOSE 119 (H) 06/25/2020   BUN 31 (H) 06/25/2020   CREATININE 2.26 (H) 06/25/2020   CALCIUM 9.2 06/25/2020   PROT 5.3 (L) 06/23/2019   ALBUMIN 2.1 (L) 06/23/2019   AST 27 06/23/2019   ALT 26 06/23/2019   ALKPHOS 31 (L) 06/23/2019   BILITOT 1.4 (H) 06/23/2019   GFRNONAA 29 (L) 06/25/2020   GFRAA 39 (L) 10/18/2019    Lab Results  Component Value Date   WBC 8.0 07/03/2020   NEUTROABS 5.4 07/03/2020   HGB 9.4 (L) 10/02/2020   HCT 27.5 (L) 07/03/2020   MCV 92.0 07/03/2020   PLT 283 07/03/2020   Lab Results  Component Value Date   IRON 32 (L) 07/03/2020   TIBC 403 07/03/2020   IRONPCTSAT 8 (L) 07/03/2020   Lab Results  Component Value Date   FERRITIN 34 07/03/2020     STUDIES: No results found.  ASSESSMENT: Anemia, unspecified.  PLAN:   1. Anemia, unspecified:  -Bone Marrow from 06/25/2020 was essentially negative. -Noted to have low iron saturations of 8%.  Ferritin 34.  Hemoglobin 8.3. -He received 5 doses of IV Venofer from 07/15/2020-08/06/2020. -Rest of work-up was either  negative or within normal limits. -Intelligen myeloid panel revealed SF 3 B1 mutation that is primarily  associated with MDS. -Labs from 10/02/2020 show an improvement in his hemoglobin to 9.4.   2.  MGUS:   -Noted to have an elevation in his M spike of 0.3 which has remained stable. -Kappa free light chains mildly elevated and immunoglobulins are within normal range. -Bone marrow biopsy revealed approximately 1% plasma cells. -This is likely clinically significant and but will monitor on a yearly basis.  3.  CKD: -Most recent creatinine was 2.26 on 06/25/2020. -Continue follow-up with nephrology. -He last received 40,000 units Retacrit on 06/17/2020 for hemoglobin of 8.3.  Disposition: -Retacrit today (unable to give due to insurance and was not authorized). -RTC monthly with labs (Iron, ferritin, cbc) and possible Retacrit/venofer. -RTC in 3 months with repeat labs, MD assessment +/- Retacrit/Venofer.   Greater than 50% was spent in counseling and coordination of care with this patient including but not limited to discussion of the relevant topics above (See A&P) including, but not limited to diagnosis and management of acute and chronic medical conditions.   Patient expressed understanding and was in agreement with this plan. He also understands that He can call clinic at any time with any questions, concerns, or complaints.    Jacquelin Hawking, NP   10/03/2020 2:10 PM

## 2020-11-03 ENCOUNTER — Other Ambulatory Visit: Payer: Self-pay

## 2020-11-03 ENCOUNTER — Inpatient Hospital Stay: Payer: Medicare HMO | Attending: Oncology

## 2020-11-03 ENCOUNTER — Inpatient Hospital Stay: Payer: Medicare HMO

## 2020-11-03 VITALS — BP 129/50 | HR 58 | Resp 18

## 2020-11-03 DIAGNOSIS — D649 Anemia, unspecified: Secondary | ICD-10-CM

## 2020-11-03 DIAGNOSIS — Z79899 Other long term (current) drug therapy: Secondary | ICD-10-CM | POA: Diagnosis not present

## 2020-11-03 DIAGNOSIS — N189 Chronic kidney disease, unspecified: Secondary | ICD-10-CM | POA: Insufficient documentation

## 2020-11-03 DIAGNOSIS — D631 Anemia in chronic kidney disease: Secondary | ICD-10-CM | POA: Diagnosis present

## 2020-11-03 LAB — IRON AND TIBC
Iron: 37 ug/dL — ABNORMAL LOW (ref 45–182)
Saturation Ratios: 10 % — ABNORMAL LOW (ref 17.9–39.5)
TIBC: 357 ug/dL (ref 250–450)
UIBC: 320 ug/dL

## 2020-11-03 LAB — CBC WITH DIFFERENTIAL/PLATELET
Abs Immature Granulocytes: 0.03 10*3/uL (ref 0.00–0.07)
Basophils Absolute: 0.1 10*3/uL (ref 0.0–0.1)
Basophils Relative: 1 %
Eosinophils Absolute: 0.2 10*3/uL (ref 0.0–0.5)
Eosinophils Relative: 2 %
HCT: 27 % — ABNORMAL LOW (ref 39.0–52.0)
Hemoglobin: 8.8 g/dL — ABNORMAL LOW (ref 13.0–17.0)
Immature Granulocytes: 0 %
Lymphocytes Relative: 19 %
Lymphs Abs: 1.8 10*3/uL (ref 0.7–4.0)
MCH: 32.2 pg (ref 26.0–34.0)
MCHC: 32.6 g/dL (ref 30.0–36.0)
MCV: 98.9 fL (ref 80.0–100.0)
Monocytes Absolute: 0.8 10*3/uL (ref 0.1–1.0)
Monocytes Relative: 9 %
Neutro Abs: 6.6 10*3/uL (ref 1.7–7.7)
Neutrophils Relative %: 69 %
Platelets: 267 10*3/uL (ref 150–400)
RBC: 2.73 MIL/uL — ABNORMAL LOW (ref 4.22–5.81)
RDW: 17.1 % — ABNORMAL HIGH (ref 11.5–15.5)
WBC: 9.5 10*3/uL (ref 4.0–10.5)
nRBC: 0 % (ref 0.0–0.2)

## 2020-11-03 LAB — VITAMIN B12: Vitamin B-12: 1109 pg/mL — ABNORMAL HIGH (ref 180–914)

## 2020-11-03 LAB — FERRITIN: Ferritin: 249 ng/mL (ref 24–336)

## 2020-11-03 MED ORDER — EPOETIN ALFA-EPBX 40000 UNIT/ML IJ SOLN
40000.0000 [IU] | Freq: Once | INTRAMUSCULAR | Status: AC
  Administered 2020-11-03: 40000 [IU] via SUBCUTANEOUS
  Filled 2020-11-03: qty 1

## 2020-11-04 ENCOUNTER — Telehealth: Payer: Self-pay

## 2020-11-04 NOTE — Telephone Encounter (Signed)
Incoming call from pt's wife who states that they have still not received the updated order for catheters 3 times daily. New order form faxed with [facility] at [place].

## 2020-12-03 ENCOUNTER — Other Ambulatory Visit: Payer: Self-pay

## 2020-12-03 ENCOUNTER — Inpatient Hospital Stay: Payer: Medicare HMO

## 2020-12-03 ENCOUNTER — Inpatient Hospital Stay: Payer: Medicare HMO | Attending: Oncology

## 2020-12-03 VITALS — BP 138/58 | HR 58 | Resp 18

## 2020-12-03 DIAGNOSIS — D649 Anemia, unspecified: Secondary | ICD-10-CM | POA: Diagnosis not present

## 2020-12-03 DIAGNOSIS — N189 Chronic kidney disease, unspecified: Secondary | ICD-10-CM | POA: Insufficient documentation

## 2020-12-03 LAB — HEMOGLOBIN: Hemoglobin: 8.8 g/dL — ABNORMAL LOW (ref 13.0–17.0)

## 2020-12-03 MED ORDER — SODIUM CHLORIDE 0.9 % IV SOLN
INTRAVENOUS | Status: DC
  Filled 2020-12-03: qty 250

## 2020-12-03 MED ORDER — SODIUM CHLORIDE 0.9 % IV SOLN
200.0000 mg | Freq: Once | INTRAVENOUS | Status: DC
  Filled 2020-12-03: qty 10

## 2020-12-03 MED ORDER — IRON SUCROSE 20 MG/ML IV SOLN
200.0000 mg | Freq: Once | INTRAVENOUS | Status: AC
  Administered 2020-12-03: 200 mg via INTRAVENOUS
  Filled 2020-12-03: qty 10

## 2020-12-03 NOTE — Patient Instructions (Signed)
[facility]  Discharge Instructions: Thank you for choosing [facility] to provide your oncology and hematology care.  If you have a lab appointment with the [facility], please go directly to the [facility] and check in at the registration area.  Wear comfortable clothing and clothing appropriate for easy access to any Portacath or PICC line.   We strive to give you quality time with your provider. You may need to reschedule your appointment if you arrive late (15 or more minutes).  Arriving late affects you and other patients whose appointments are after yours.  Also, if you miss three or more appointments without notifying the office, you may be dismissed from the clinic at the provider's discretion.      For prescription refill requests, have your pharmacy contact our office and allow 72 hours for refills to be completed.    Today you received the following : Venofer   To help prevent nausea and vomiting after your treatment, we encourage you to take your nausea medication as directed.  BELOW ARE SYMPTOMS THAT SHOULD BE REPORTED IMMEDIATELY: . *FEVER GREATER THAN 100.4 F (38 C) OR HIGHER . *CHILLS OR SWEATING . *NAUSEA AND VOMITING THAT IS NOT CONTROLLED WITH YOUR NAUSEA MEDICATION . *UNUSUAL SHORTNESS OF BREATH . *UNUSUAL BRUISING OR BLEEDING . *URINARY PROBLEMS (pain or burning when urinating, or frequent urination) . *BOWEL PROBLEMS (unusual diarrhea, constipation, pain near the anus) . TENDERNESS IN MOUTH AND THROAT WITH OR WITHOUT PRESENCE OF ULCERS (sore throat, sores in mouth, or a toothache) . UNUSUAL RASH, SWELLING OR PAIN  . UNUSUAL VAGINAL DISCHARGE OR ITCHING   Items with * indicate a potential emergency and should be followed up as soon as possible or go to the Emergency Department if any problems should occur.  Please show the CHEMOTHERAPY ALERT CARD or IMMUNOTHERAPY ALERT CARD at check-in to the Emergency  Department and triage nurse.  Should you have questions after your visit or need to cancel or reschedule your appointment, please contact [facility]  [phone] and follow the prompts.  Office hours are 8:00 a.m. to 4:30 p.m. Monday - Friday. Please note that voicemails left after 4:00 p.m. may not be returned until the following business day.  We are closed weekends and major holidays. You have access to a nurse at all times for urgent questions. Please call the main number to the clinic [phone] and follow the prompts.  For any non-urgent questions, you may also contact your provider using MyChart. We now offer e-Visits for anyone 32 and older to request care online for non-urgent symptoms. For details visit [email or web address].   Also download the MyChart app! Go to the app store, search "MyChart", open the app, select [facility], and log in with your MyChart username and password.  Due to Covid, a mask is required upon entering the hospital/clinic. If you do not have a mask, one will be given to you upon arrival. For doctor visits, patients may have 1 support person aged 24 or older with them. For treatment visits, patients cannot have anyone with them due to current Covid guidelines and our immunocompromised population.

## 2020-12-31 DIAGNOSIS — D631 Anemia in chronic kidney disease: Secondary | ICD-10-CM | POA: Insufficient documentation

## 2020-12-31 DIAGNOSIS — N189 Chronic kidney disease, unspecified: Secondary | ICD-10-CM | POA: Insufficient documentation

## 2020-12-31 DIAGNOSIS — N400 Enlarged prostate without lower urinary tract symptoms: Secondary | ICD-10-CM | POA: Insufficient documentation

## 2020-12-31 DIAGNOSIS — I509 Heart failure, unspecified: Secondary | ICD-10-CM | POA: Insufficient documentation

## 2021-01-07 ENCOUNTER — Other Ambulatory Visit: Payer: Self-pay | Admitting: *Deleted

## 2021-01-07 ENCOUNTER — Other Ambulatory Visit: Payer: Self-pay | Admitting: Nephrology

## 2021-01-07 DIAGNOSIS — N184 Chronic kidney disease, stage 4 (severe): Secondary | ICD-10-CM

## 2021-01-07 DIAGNOSIS — D649 Anemia, unspecified: Secondary | ICD-10-CM

## 2021-01-07 NOTE — Progress Notes (Signed)
fe

## 2021-01-08 ENCOUNTER — Inpatient Hospital Stay: Payer: Medicare HMO

## 2021-01-08 ENCOUNTER — Inpatient Hospital Stay: Payer: Medicare HMO | Attending: Oncology

## 2021-01-08 ENCOUNTER — Inpatient Hospital Stay (HOSPITAL_BASED_OUTPATIENT_CLINIC_OR_DEPARTMENT_OTHER): Payer: Medicare HMO | Admitting: Oncology

## 2021-01-08 ENCOUNTER — Other Ambulatory Visit: Payer: Self-pay

## 2021-01-08 VITALS — BP 136/61 | HR 63 | Temp 98.9°F | Resp 18 | Ht 68.0 in | Wt 184.2 lb

## 2021-01-08 DIAGNOSIS — E559 Vitamin D deficiency, unspecified: Secondary | ICD-10-CM | POA: Insufficient documentation

## 2021-01-08 DIAGNOSIS — N189 Chronic kidney disease, unspecified: Secondary | ICD-10-CM | POA: Insufficient documentation

## 2021-01-08 DIAGNOSIS — H9190 Unspecified hearing loss, unspecified ear: Secondary | ICD-10-CM | POA: Insufficient documentation

## 2021-01-08 DIAGNOSIS — D649 Anemia, unspecified: Secondary | ICD-10-CM

## 2021-01-08 DIAGNOSIS — M109 Gout, unspecified: Secondary | ICD-10-CM | POA: Insufficient documentation

## 2021-01-08 DIAGNOSIS — Z8616 Personal history of COVID-19: Secondary | ICD-10-CM | POA: Insufficient documentation

## 2021-01-08 LAB — CBC WITH DIFFERENTIAL/PLATELET
Abs Immature Granulocytes: 0.03 10*3/uL (ref 0.00–0.07)
Basophils Absolute: 0.1 10*3/uL (ref 0.0–0.1)
Basophils Relative: 1 %
Eosinophils Absolute: 0.2 10*3/uL (ref 0.0–0.5)
Eosinophils Relative: 3 %
HCT: 26.2 % — ABNORMAL LOW (ref 39.0–52.0)
Hemoglobin: 8.6 g/dL — ABNORMAL LOW (ref 13.0–17.0)
Immature Granulocytes: 0 %
Lymphocytes Relative: 21 %
Lymphs Abs: 1.8 10*3/uL (ref 0.7–4.0)
MCH: 33 pg (ref 26.0–34.0)
MCHC: 32.8 g/dL (ref 30.0–36.0)
MCV: 100.4 fL — ABNORMAL HIGH (ref 80.0–100.0)
Monocytes Absolute: 0.7 10*3/uL (ref 0.1–1.0)
Monocytes Relative: 8 %
Neutro Abs: 5.6 10*3/uL (ref 1.7–7.7)
Neutrophils Relative %: 67 %
Platelets: 221 10*3/uL (ref 150–400)
RBC: 2.61 MIL/uL — ABNORMAL LOW (ref 4.22–5.81)
RDW: 15.4 % (ref 11.5–15.5)
WBC: 8.4 10*3/uL (ref 4.0–10.5)
nRBC: 0 % (ref 0.0–0.2)

## 2021-01-08 LAB — IRON AND TIBC
Iron: 52 ug/dL (ref 45–182)
Saturation Ratios: 17 % — ABNORMAL LOW (ref 17.9–39.5)
TIBC: 298 ug/dL (ref 250–450)
UIBC: 246 ug/dL

## 2021-01-08 LAB — FERRITIN: Ferritin: 372 ng/mL — ABNORMAL HIGH (ref 24–336)

## 2021-01-08 MED ORDER — EPOETIN ALFA-EPBX 40000 UNIT/ML IJ SOLN
40000.0000 [IU] | Freq: Once | INTRAMUSCULAR | Status: AC
  Administered 2021-01-08: 40000 [IU] via SUBCUTANEOUS
  Filled 2021-01-08: qty 1

## 2021-01-08 NOTE — Progress Notes (Signed)
[place]  [phone] [phone]) [phone]  ID: [name] OB: 1945/12/14  MR#: [id number]  WIO#:[id number]  Patient Care Team: [name], MD as PCP - General (Internal Medicine)  CHIEF COMPLAINT: Anemia, unspecified.  INTERVAL HISTORY: Patient returns to clinic today for further evaluation and consideration of treatment with Retacrit.  He continues to have chronic weakness and fatigue, but otherwise feels well. He has no neurologic complaints.  He denies any recent fevers or illnesses.  He has a good appetite and denies weight loss.  He has chronic shortness of breath requiring oxygen, but denies any chest pain, cough, or hemoptysis.  He denies any nausea, vomiting, constipation, or diarrhea.  He has no melena or hematochezia.  He has no urinary complaints.  Patient offers no further specific complaints today.  REVIEW OF SYSTEMS:   Review of Systems  Constitutional:  Positive for malaise/fatigue. Negative for fever and weight loss.  Respiratory:  Positive for shortness of breath. Negative for cough and hemoptysis.   Cardiovascular: Negative.  Negative for chest pain and leg swelling.  Gastrointestinal: Negative.  Negative for abdominal pain, blood in stool and melena.  Genitourinary: Negative.  Negative for hematuria.  Musculoskeletal: Negative.  Negative for back pain.  Skin: Negative.  Negative for rash.  Neurological:  Positive for weakness. Negative for dizziness, focal weakness and headaches.  Psychiatric/Behavioral: Negative.  The patient is not nervous/anxious.    As per HPI. Otherwise, a complete review of systems is negative.  PAST MEDICAL HISTORY: Past Medical History:  Diagnosis Date   Asthma    Atrial fibrillation (HCC)    BPH (benign prostatic hyperplasia)    COPD (chronic obstructive pulmonary disease) ([place])    Diabetes mellitus without complication (HCC)    Helicobacter pylori gastritis    Hyperlipidemia    Hypertension     Pneumonia    Right heart failure ([place])     PAST SURGICAL HISTORY: Past Surgical History:  Procedure Laterality Date   BACK SURGERY     x3 L3-L4    CARDIAC ELECTROPHYSIOLOGY MAPPING AND ABLATION     CARDIOVERSION N/A 06/22/2019   Procedure: CARDIOVERSION;  Surgeon: Fay Records, MD;  Location: [facility];  Service: Cardiovascular;  Laterality: N/A;   COLONOSCOPY WITH PROPOFOL N/A 06/15/2017   Procedure: COLONOSCOPY WITH PROPOFOL;  Surgeon: Toledo, Benay Pike, MD;  Location: [facility] ENDOSCOPY;  Service: Gastroenterology;  Laterality: N/A;   ENTEROSCOPY N/A 11/24/2017   Procedure: ENTEROSCOPY;  Surgeon: Jonathon Bellows, MD;  Location: [facility] ENDOSCOPY;  Service: Gastroenterology;  Laterality: N/A;   ESOPHAGOGASTRODUODENOSCOPY N/A 11/27/2017   Procedure: ESOPHAGOGASTRODUODENOSCOPY (EGD);  Surgeon: Lucilla Lame, MD;  Location[facility] ENDOSCOPY;  Service: Endoscopy;  Laterality: N/A;   ESOPHAGOGASTRODUODENOSCOPY (EGD) WITH PROPOFOL N/A 06/02/2017   Procedure: ESOPHAGOGASTRODUODENOSCOPY (EGD) WITH PROPOFOL;  Surgeon: Toledo, Benay Pike, MD;  Location: [facility] ENDOSCOPY;  Service: Gastroenterology;  Laterality: N/A;   GIVENS CAPSULE STUDY N/A 11/25/2017   Procedure: GIVENS CAPSULE STUDY;  Surgeon: Jonathon Bellows, MD;  [facility] ENDOSCOPY;  Service: Gastroenterology;  Laterality: N/A;   HAND SURGERY     RIGHT/LEFT HEART CATH AND CORONARY ANGIOGRAPHY N/A 01/17/2020   Procedure: RIGHT/LEFT HEART CATH AND CORONARY ANGIOGRAPHY;  Surgeon: Corey Skains, MD;  Location: [place] CV LAB;  Service: Cardiovascular;  Laterality: N/A;    FAMILY HISTORY: Family History  Problem Relation Age of Onset   Heart disease Mother    Cancer Father    Cancer Paternal Grandfather     ADVANCED DIRECTIVES (Y/N):  N  HEALTH MAINTENANCE: Social History   Tobacco Use   Smoking status: Former    Packs/day: 1.00    Years: 30.00    Pack years: 30.00    Types: Cigarettes    Quit date: 1995    Years since quitting:  27.4   Smokeless tobacco: Never  Vaping Use   Vaping Use: Never used  Substance Use Topics   Alcohol use: No    Alcohol/week: 0.0 standard drinks    Comment: occasional   Drug use: No     Colonoscopy:  PAP:  Bone density:  Lipid panel:  Allergies  Allergen Reactions   Shrimp [Shellfish Allergy] Anaphylaxis   Shrimp Extract Allergy Skin Test     Current Outpatient Medications  Medication Sig Dispense Refill   acetaminophen (TYLENOL) 500 MG tablet Take 1,000 mg by mouth every 6 (six) hours as needed for moderate pain or headache.     albuterol (VENTOLIN HFA) 108 (90 Base) MCG/ACT inhaler Inhale 2 puffs into the lungs daily.     allopurinol (ZYLOPRIM) 100 MG tablet Take 100 mg by mouth daily.     ascorbic acid (VITAMIN C) 500 MG tablet Take 500 mg by mouth daily.     atorvastatin (LIPITOR) 20 MG tablet Take 20 mg by mouth at bedtime.      BREO ELLIPTA 100-25 MCG/INH AEPB Inhale 1 puff into the lungs daily.     Cholecalciferol (DIALYVITE VITAMIN D 5000) 125 MCG (5000 UT) capsule Take 5,000 Units by mouth daily.     colchicine 0.6 MG tablet Take 0.6 mg by mouth daily as needed (gout flare).     ELIQUIS 5 MG TABS tablet SMARTSIG:1 Tablet(s) By Mouth Every 12 Hours     finasteride (PROSCAR) 5 MG tablet Take 1 tablet (5 mg total) by mouth daily. 30 tablet 11   furosemide (LASIX) 80 MG tablet Take 40 mg by mouth daily.      insulin aspart (NOVOLOG) 100 UNIT/ML FlexPen Inject 14-26 Units into the skin 3 (three) times daily with meals.      insulin degludec (TRESIBA FLEXTOUCH) 200 UNIT/ML FlexTouch Pen Inject 46 Units into the skin at bedtime.     Insulin Pen Needle 31G X 8 MM MISC BD Ultra-Fine Short Pen Needle 31 gauge x 5/16"     lisinopril (ZESTRIL) 20 MG tablet Take 10 mg by mouth at bedtime.      magnesium oxide (MAG-OX) 400 MG tablet Take 1 tablet by mouth 3 (three) times daily.     Magnesium Oxide 400 (240 Mg) MG TABS Take 400 mg by mouth in the morning, at noon, and at bedtime.      metoprolol tartrate (LOPRESSOR) 25 MG tablet Take 50 mg by mouth 2 (two) times daily.      pantoprazole (PROTONIX) 40 MG tablet Take 1 tablet (40 mg total) by mouth 2 (two) times daily before a meal. 60 tablet 0   sildenafil (REVATIO) 20 MG tablet Take 20 mg by mouth 3 (three) times daily.     sodium chloride (OCEAN) 0.65 % SOLN nasal spray Place 1 spray into both nostrils as needed for congestion.     TYVASO REFILL 0.6 MG/ML SOLN Inhale 0.6 mg into the lungs in the morning, at noon, in the evening, and at bedtime.     vitamin B-12 (CYANOCOBALAMIN) 1000 MCG tablet Take 1,000 mcg by mouth daily.     No current facility-administered medications for this visit.    OBJECTIVE: Vitals:   01/08/21  1352  BP: 136/61  Pulse: 63  Resp: 18  Temp: 98.9 F (37.2 C)  SpO2: 100%     Body mass index is 28.01 kg/m.    ECOG FS:1 - Symptomatic but completely ambulatory  General: Well-developed, well-nourished, no acute distress. Eyes: Pink conjunctiva, anicteric sclera. HEENT: Normocephalic, moist mucous membranes. Lungs: No audible wheezing or coughing. Heart: Regular rate and rhythm. Abdomen: Soft, nontender, no obvious distention. Musculoskeletal: No edema, cyanosis, or clubbing. Neuro: Alert, answering all questions appropriately. Cranial nerves grossly intact. Skin: No rashes or petechiae noted. Psych: Normal affect.    LAB RESULTS:  Lab Results  Component Value Date   NA 136 06/25/2020   K 4.6 06/25/2020   CL 101 06/25/2020   CO2 24 06/25/2020   GLUCOSE 119 (H) 06/25/2020   BUN 31 (H) 06/25/2020   CREATININE 2.26 (H) 06/25/2020   CALCIUM 9.2 06/25/2020   PROT 5.3 (L) 06/23/2019   ALBUMIN 2.1 (L) 06/23/2019   AST 27 06/23/2019   ALT 26 06/23/2019   ALKPHOS 31 (L) 06/23/2019   BILITOT 1.4 (H) 06/23/2019   GFRNONAA 29 (L) 06/25/2020   GFRAA 39 (L) 10/18/2019    Lab Results  Component Value Date   WBC 8.4 01/08/2021   NEUTROABS 5.6 01/08/2021   HGB 8.6 (L) 01/08/2021    HCT 26.2 (L) 01/08/2021   MCV 100.4 (H) 01/08/2021   PLT 221 01/08/2021   Lab Results  Component Value Date   IRON 52 01/08/2021   TIBC 298 01/08/2021   IRONPCTSAT 17 (L) 01/08/2021   Lab Results  Component Value Date   FERRITIN 372 (H) 01/08/2021     STUDIES: No results found.   ASSESSMENT: Anemia, unspecified.  PLAN:   1. Anemia, unspecified: Bone marrow biopsy completed on June 25, 2020 was essentially negative only with a hypercellular marrow for age.  Patient was noted to have decreased iron stores and rare ringed sideroblasts.  FISH and cytogenetics were negative.  Iron stores are significantly improved other than a mildly decreased iron saturation.  Previously, repeat B12, folate, and hemolysis labs were found to be either negative or within normal limits.  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  Patient does not require additional Venna for today.  His last dose was on Dec 03, 2020.  Proceed with 40,000 units Retacrit.  Patient will return monthly for laboratory and Retacrit if his hemoglobin remains below 10.0.  He would then return to clinic in 4 months for further evaluation and continuation of treatment.   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  Kappa free light chains are only mildly elevated and immunoglobulins are within normal limits.  Bone marrow biopsy only revealed approximately 1% plasma cells.  This is likely clinically insignificant, but will continue to monitor on a yearly basis. 3.  Chronic renal insufficiency.  Chronic and unchanged.  Follow-up with nephrology as indicated.  Retacrit as above.  I spent a total of 30 minutes reviewing chart data, face-to-face evaluation with the patient, counseling and coordination of care as detailed above.   Patient expressed understanding and was in agreement with this plan. He also understands that He can call clinic at any time with any  questions, concerns, or complaints.    Lloyd Huger, MD   01/10/2021 2:54 PM

## 2021-01-27 ENCOUNTER — Other Ambulatory Visit: Payer: Self-pay

## 2021-01-27 ENCOUNTER — Ambulatory Visit (INDEPENDENT_AMBULATORY_CARE_PROVIDER_SITE_OTHER): Payer: Medicare HMO | Admitting: Physician Assistant

## 2021-01-27 ENCOUNTER — Encounter: Payer: Self-pay | Admitting: Physician Assistant

## 2021-01-27 VITALS — BP 136/82 | HR 57 | Ht 68.0 in | Wt 184.0 lb

## 2021-01-27 DIAGNOSIS — N401 Enlarged prostate with lower urinary tract symptoms: Secondary | ICD-10-CM | POA: Diagnosis not present

## 2021-01-27 DIAGNOSIS — R338 Other retention of urine: Secondary | ICD-10-CM

## 2021-01-27 NOTE — Progress Notes (Signed)
01/27/2021 1:49 PM   Jay Morris 1946/03/12 [id number]  CC: Chief Complaint  Patient presents with   Benign Prostatic Hypertrophy   Urinary Retention   HPI: Jay Morris is a 75 y.o. male with PMH diabetes, COPD, CHF, CKD 4, and BPH with urinary retention managed by CIC on finasteride who presents today for routine follow-up.  He is accompanied today by his wife, who contributes to HPI.  Today he reports continued twice daily CIC performed by his wife.  She brings written records of his urinary output with each catheterization and on review, there is an average of 600 to 800 mL of output each time.  He notes decreasing frequency of spontaneous voids, now once every several days.  He notes some pain with initiation but no gross hematuria.  He is up-to-date with nephrology, having seen Dr. Lanora Manis most recently last month.  He is scheduled for renal ultrasound in 2 days.  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[place])     Surgical History: Past Surgical History:  Procedure Laterality Date   BACK SURGERY     x3 L3-L4    CARDIAC ELECTROPHYSIOLOGY MAPPING AND ABLATION     CARDIOVERSION N/A 06/22/2019   Procedure: CARDIOVERSION;  Surgeon: Fay Records, MD;  Location: [place];  Service: Cardiovascular;  Laterality: N/A;   COLONOSCOPY WITH PROPOFOL N/A 06/15/2017   Procedure: COLONOSCOPY WITH PROPOFOL;  Surgeon: Toledo, Benay Pike, MD;  Location: [facility] ENDOSCOPY;  Service: Gastroenterology;  Laterality: N/A;   ENTEROSCOPY N/A 11/24/2017   Procedure: ENTEROSCOPY;  Surgeon: Jonathon Bellows, MD;  Location: [facility] ENDOSCOPY;  Service: Gastroenterology;  Laterality: N/A;    ESOPHAGOGASTRODUODENOSCOPY N/A 11/27/2017   Procedure: ESOPHAGOGASTRODUODENOSCOPY (EGD);  Surgeon: Lucilla Lame, MD;  Location: [facility] ENDOSCOPY;  Service: Endoscopy;  Laterality: N/A;   ESOPHAGOGASTRODUODENOSCOPY (EGD) WITH PROPOFOL N/A 06/02/2017   Procedure: ESOPHAGOGASTRODUODENOSCOPY (EGD) WITH PROPOFOL;  Surgeon: Toledo, Benay Pike, MD;  Location: [facility] ENDOSCOPY;  Service: Gastroenterology;  Laterality: N/A;   GIVENS CAPSULE STUDY N/A 11/25/2017   Procedure: GIVENS CAPSULE STUDY;  Surgeon: Jonathon Bellows, MD;  Location: [facility] ENDOSCOPY;  Service: Gastroenterology;  Laterality: N/A;   HAND SURGERY     RIGHT/LEFT HEART CATH AND CORONARY ANGIOGRAPHY N/A 01/17/2020   Procedure: RIGHT/LEFT HEART CATH AND CORONARY ANGIOGRAPHY;  Surgeon: Corey Skains, MD;  Location: [place] CV LAB;  Service: Cardiovascular;  Laterality: N/A;    Home Medications:  Allergies as of 01/27/2021       Reactions   Shrimp [shellfish Allergy] Anaphylaxis   Shrimp Extract Allergy Skin Test         Medication List        Accurate as of January 27, 2021  1:49 PM. If you have any questions, ask your nurse or doctor.          acetaminophen 500 MG tablet Commonly known as: TYLENOL Take 1,000 mg by mouth every 6 (six) hours as needed for moderate pain or headache.   albuterol 108 (90 Base) MCG/ACT inhaler Commonly known as: VENTOLIN HFA Inhale 2 puffs into the lungs daily.   allopurinol 100 MG tablet Commonly known as: ZYLOPRIM Take 100 mg by mouth daily.  ascorbic acid 500 MG tablet Commonly known as: VITAMIN C Take 500 mg by mouth daily.   atorvastatin 20 MG tablet Commonly known as: LIPITOR Take 20 mg by mouth at bedtime.   Breo Ellipta 100-25 MCG/INH Aepb Generic drug: fluticasone furoate-vilanterol Inhale 1 puff into the lungs daily.   colchicine 0.6 MG tablet Take 0.6 mg by mouth daily as needed (gout flare).   Dialyvite Vitamin D 5000 125 MCG (5000 UT) capsule Generic drug:  Cholecalciferol Take 5,000 Units by mouth daily.   Eliquis 5 MG Tabs tablet Generic drug: apixaban SMARTSIG:1 Tablet(s) By Mouth Every 12 Hours   finasteride 5 MG tablet Commonly known as: PROSCAR Take 1 tablet (5 mg total) by mouth daily.   furosemide 80 MG tablet Commonly known as: LASIX Take 40 mg by mouth daily.   insulin aspart 100 UNIT/ML FlexPen Commonly known as: NOVOLOG Inject 14-26 Units into the skin 3 (three) times daily with meals.   Insulin Pen Needle 31G X 8 MM Misc BD Ultra-Fine Short Pen Needle 31 gauge x 5/16"   lisinopril 20 MG tablet Commonly known as: ZESTRIL Take 10 mg by mouth at bedtime.   magnesium oxide 400 (240 Mg) MG tablet Commonly known as: MAG-OX Take 400 mg by mouth in the morning, at noon, and at bedtime.   magnesium oxide 400 MG tablet Commonly known as: MAG-OX Take 1 tablet by mouth 3 (three) times daily.   metoprolol tartrate 25 MG tablet Commonly known as: LOPRESSOR Take 50 mg by mouth 2 (two) times daily.   pantoprazole 40 MG tablet Commonly known as: PROTONIX Take 1 tablet (40 mg total) by mouth 2 (two) times daily before a meal.   sildenafil 20 MG tablet Commonly known as: REVATIO Take 20 mg by mouth 3 (three) times daily.   sodium chloride 0.65 % Soln nasal spray Commonly known as: OCEAN Place 1 spray into both nostrils as needed for congestion.   Tyler Aas FlexTouch 200 UNIT/ML FlexTouch Pen Generic drug: insulin degludec Inject 46 Units into the skin at bedtime.   Tyvaso Refill 0.6 MG/ML Soln Generic drug: Treprostinil Inhale 0.6 mg into the lungs in the morning, at noon, in the evening, and at bedtime.   vitamin B-12 1000 MCG tablet Commonly known as: CYANOCOBALAMIN Take 1,000 mcg by mouth daily.        Allergies:  Allergies  Allergen Reactions   Shrimp [Shellfish Allergy] Anaphylaxis   Shrimp Extract Allergy Skin Test     Family History: Family History  Problem Relation Age of Onset   Heart disease  Mother    Cancer Father    Cancer Paternal Grandfather     Social History:   reports that he quit smoking about 27 years ago. His smoking use included cigarettes. He has a 30.00 pack-year smoking history. He has never used smokeless tobacco. He reports that he does not drink alcohol and does not use drugs.  Physical Exam: BP 136/82   Pulse (!) 57   Ht _0  (1.727 m)   Wt 184 lb (83.5 kg)   BMI 27.98 kg/m   Constitutional:  Alert and oriented, no acute distress, nontoxic appearing HEENT: [place], AT Cardiovascular: No clubbing, cyanosis, or edema Respiratory: Normal respiratory effort, no increased work of breathing, on supplemental oxygen GU: External hemorrhoids, normal sphincter tone, smooth, nontender 40+ cc prostate without nodules or induration Skin: No rashes, bruises or suspicious lesions Neurologic: Grossly intact, no focal deficits, moving all 4 extremities Psychiatric: Normal mood and affect  Assessment & Plan:   1. Benign prostatic hyperplasia with urinary retention Progressive decrease in spontaneous voiding frequency.  No significant findings on DRE. Given elevated urinary output with twice daily CIC, I again urged the patient and his wife to increase frequency of catheterization to at least 3 times daily.  I explained that elevated residuals increases risk for upper tract involvement and renal damage in the long term. Agree with renal US in 2 days.  Return in about 6 months (around 07/30/2021) for retention f/u with Dr. Bernardo Heater.  Debroah Loop, PA-C  [facility] [address] [place], [place] [address] [phone]

## 2021-01-29 ENCOUNTER — Other Ambulatory Visit: Payer: Self-pay

## 2021-01-29 ENCOUNTER — Ambulatory Visit
Admission: RE | Admit: 2021-01-29 | Discharge: 2021-01-29 | Disposition: A | Discharge status: Home / Self Care | Payer: Medicare HMO | Source: Ambulatory Visit | Attending: Nephrology | Admitting: Nephrology

## 2021-01-29 DIAGNOSIS — N184 Chronic kidney disease, stage 4 (severe): Secondary | ICD-10-CM | POA: Diagnosis present

## 2021-02-10 ENCOUNTER — Inpatient Hospital Stay: Payer: Medicare HMO

## 2021-02-10 ENCOUNTER — Inpatient Hospital Stay: Payer: Medicare HMO | Attending: Oncology

## 2021-02-10 VITALS — BP 163/67 | HR 59

## 2021-02-10 DIAGNOSIS — N189 Chronic kidney disease, unspecified: Secondary | ICD-10-CM | POA: Insufficient documentation

## 2021-02-10 DIAGNOSIS — D649 Anemia, unspecified: Secondary | ICD-10-CM

## 2021-02-10 LAB — CBC WITH DIFFERENTIAL/PLATELET
Abs Immature Granulocytes: 0.02 10*3/uL (ref 0.00–0.07)
Basophils Absolute: 0.1 10*3/uL (ref 0.0–0.1)
Basophils Relative: 1 %
Eosinophils Absolute: 0.2 10*3/uL (ref 0.0–0.5)
Eosinophils Relative: 2 %
HCT: 26.1 % — ABNORMAL LOW (ref 39.0–52.0)
Hemoglobin: 8.5 g/dL — ABNORMAL LOW (ref 13.0–17.0)
Immature Granulocytes: 0 %
Lymphocytes Relative: 24 %
Lymphs Abs: 1.9 10*3/uL (ref 0.7–4.0)
MCH: 33.3 pg (ref 26.0–34.0)
MCHC: 32.6 g/dL (ref 30.0–36.0)
MCV: 102.4 fL — ABNORMAL HIGH (ref 80.0–100.0)
Monocytes Absolute: 0.6 10*3/uL (ref 0.1–1.0)
Monocytes Relative: 7 %
Neutro Abs: 5.2 10*3/uL (ref 1.7–7.7)
Neutrophils Relative %: 66 %
Platelets: 207 10*3/uL (ref 150–400)
RBC: 2.55 MIL/uL — ABNORMAL LOW (ref 4.22–5.81)
RDW: 14.7 % (ref 11.5–15.5)
WBC: 7.9 10*3/uL (ref 4.0–10.5)
nRBC: 0 % (ref 0.0–0.2)

## 2021-02-10 MED ORDER — EPOETIN ALFA-EPBX 40000 UNIT/ML IJ SOLN
40000.0000 [IU] | Freq: Once | INTRAMUSCULAR | Status: AC
  Administered 2021-02-10: 40000 [IU] via SUBCUTANEOUS
  Filled 2021-02-10: qty 1

## 2021-02-11 IMAGING — DX DG CHEST 1V PORT
1 series · 1 of 1 positions shown · non-contrast
Comparison: May 31, 2019.

CLINICAL DATA: Shortness of breath

EXAM:
PORTABLE CHEST 1 VIEW

[chest]
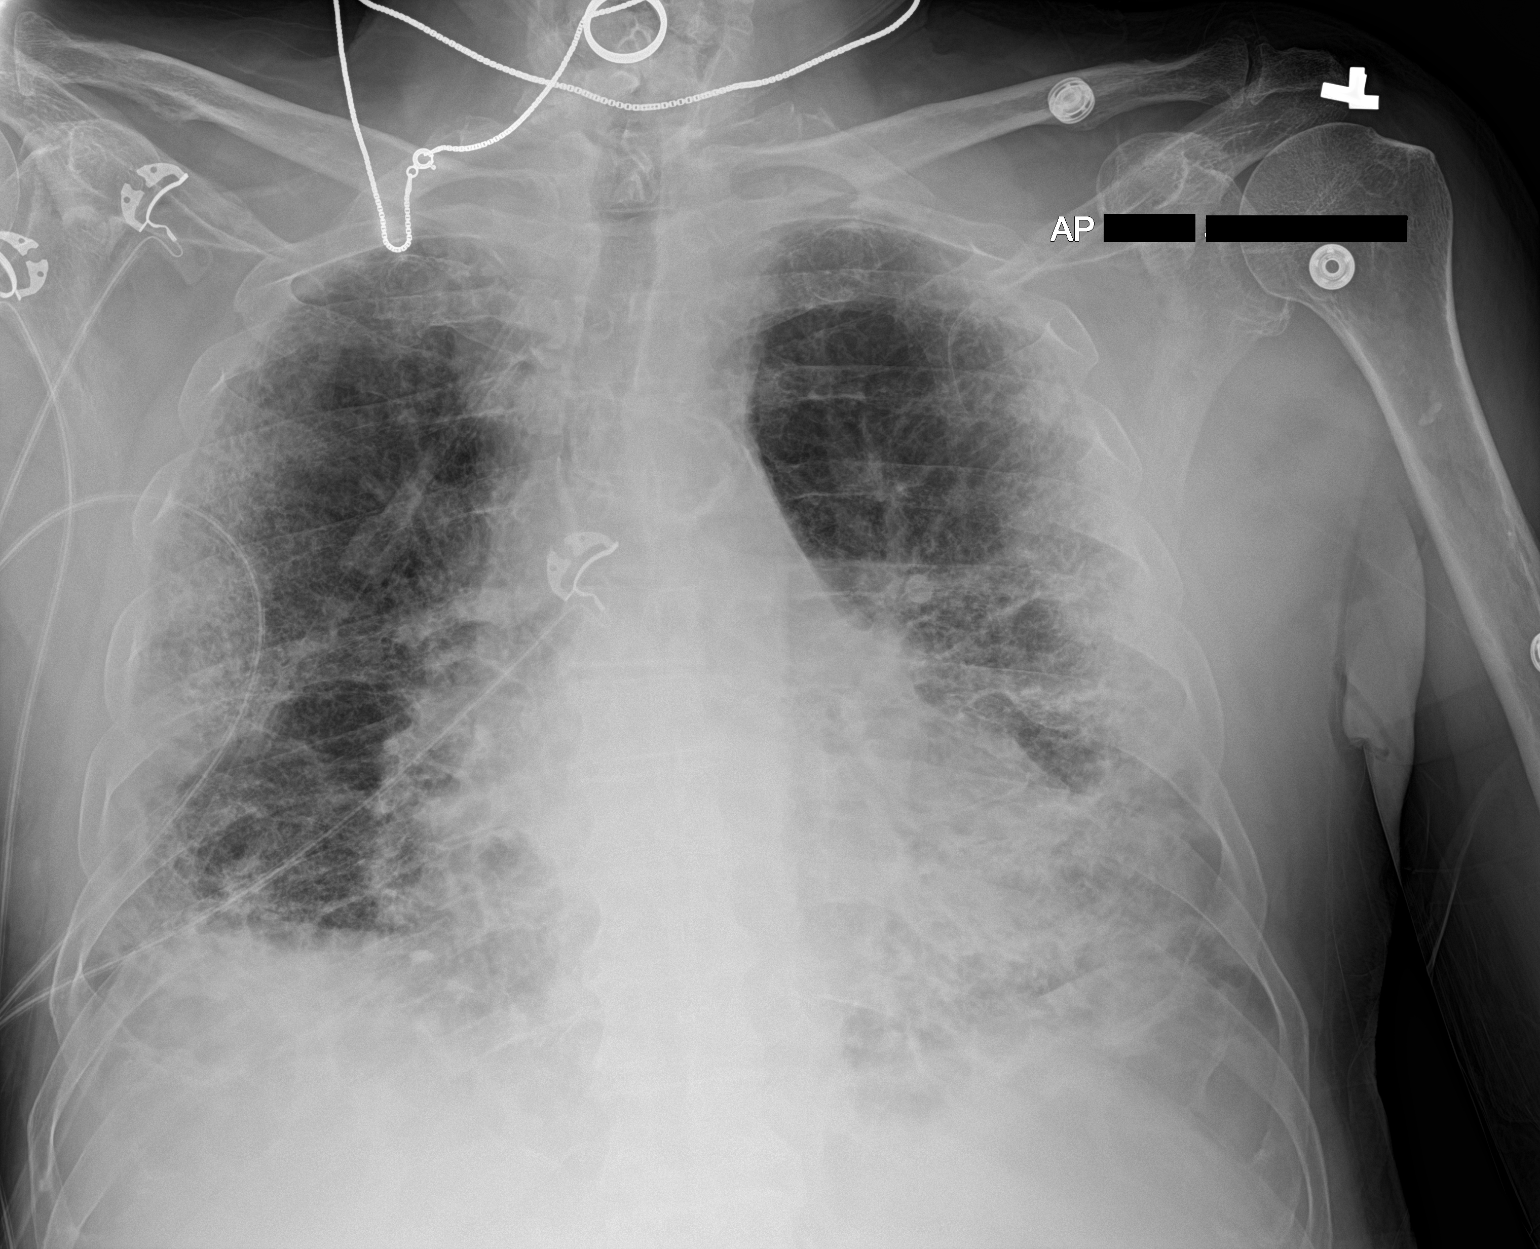

[1 of 1 positions shown; findings below may reference images not displayed]

FINDINGS: There is cardiomegaly with pulmonary venous hypertension. There is
diffuse interstitial prominence in the periphery of each lung,
progressed from recent study. There is no frank airspace
consolidation. There are small pleural effusions bilaterally.

There is aortic atherosclerosis. No adenopathy. Bones appear
osteoporotic.
IMPRESSION: Cardiomegaly with pulmonary vascular congestion. Small pleural
effusions bilaterally. Diffuse interstitial prominence in the
periphery of each lung, likely interstitial edema superimposed on
underlying chronic fibrosis. There is felt to likely be a degree of
congestive heart failure superimposed on fibrosis. No frank airspace
consolidation. Aortic Atherosclerosis ([id number]).

## 2021-02-15 IMAGING — DX DG CHEST 1V PORT
1 series · 1 of 1 positions shown · non-contrast
Comparison: Radiograph 06/19/2019, 05/31/2019

CLINICAL DATA: Reason for exam: dyspnea Patient preferred not to
take necklace off (wedding ring attached).

EXAM:
PORTABLE CHEST 1 VIEW

[chest]
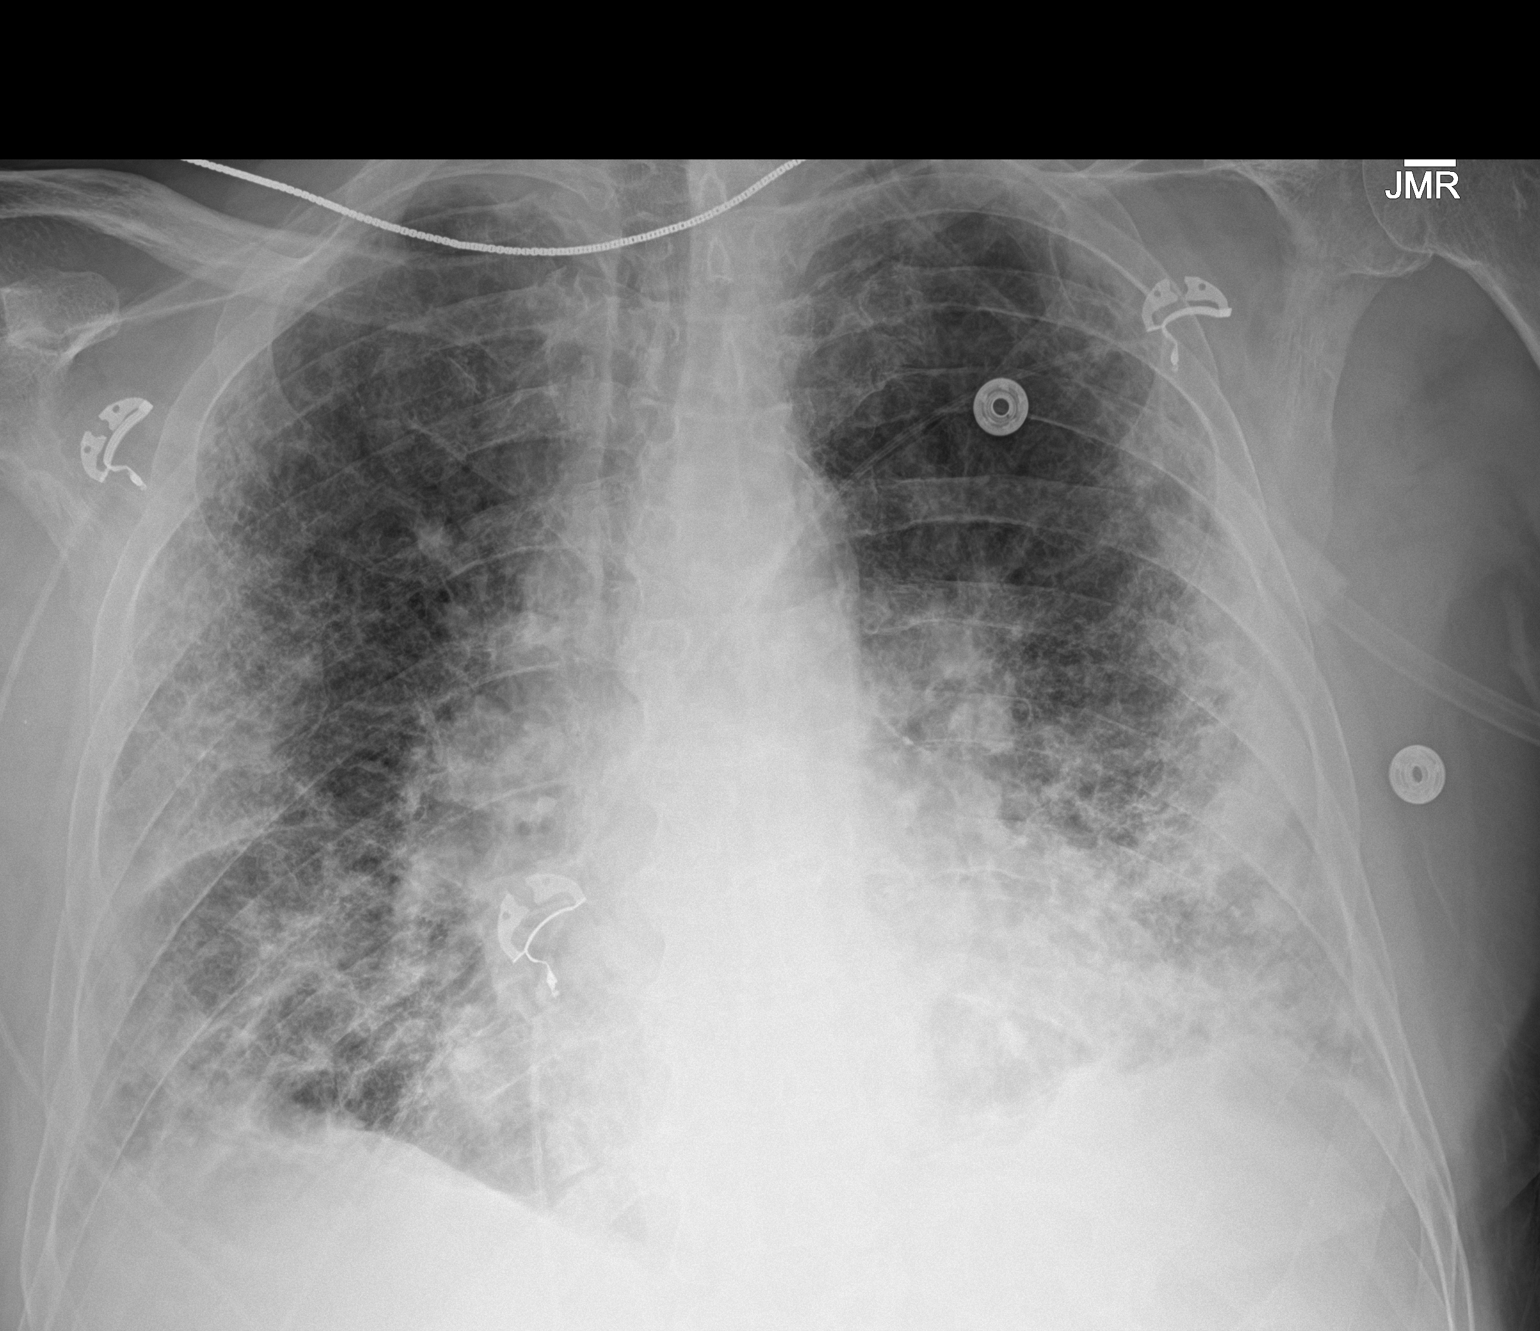

[1 of 1 positions shown; findings below may reference images not displayed]

FINDINGS: Stable cardiac silhouette. There is bilateral peripheral airspace
disease unchanged from comparison exam. Mild basilar atelectasis. No
pleural fluid. No pneumothorax. No acute osseous abnormality.
IMPRESSION: Persistent peripheral severe airspace disease. No significant
interval change. Findings suggestive of viral pneumonia.

## 2021-03-04 ENCOUNTER — Other Ambulatory Visit
Admission: RE | Admit: 2021-03-04 | Discharge: 2021-03-04 | Disposition: A | Discharge status: Home / Self Care | Payer: Medicare HMO | Source: Ambulatory Visit | Attending: Specialist | Admitting: Specialist

## 2021-03-04 DIAGNOSIS — I272 Pulmonary hypertension, unspecified: Secondary | ICD-10-CM | POA: Diagnosis present

## 2021-03-04 DIAGNOSIS — R0609 Other forms of dyspnea: Secondary | ICD-10-CM | POA: Diagnosis not present

## 2021-03-04 LAB — BRAIN NATRIURETIC PEPTIDE: B Natriuretic Peptide: 151.2 pg/mL — ABNORMAL HIGH (ref 0.0–100.0)

## 2021-03-10 ENCOUNTER — Inpatient Hospital Stay: Payer: Medicare HMO

## 2021-03-11 ENCOUNTER — Inpatient Hospital Stay: Payer: Medicare HMO

## 2021-03-11 ENCOUNTER — Inpatient Hospital Stay: Payer: Medicare HMO | Attending: Oncology

## 2021-03-11 VITALS — BP 165/57 | HR 58

## 2021-03-11 DIAGNOSIS — D649 Anemia, unspecified: Secondary | ICD-10-CM

## 2021-03-11 DIAGNOSIS — N189 Chronic kidney disease, unspecified: Secondary | ICD-10-CM | POA: Insufficient documentation

## 2021-03-11 LAB — FERRITIN: Ferritin: 263 ng/mL (ref 24–336)

## 2021-03-11 LAB — IRON AND TIBC
Iron: 64 ug/dL (ref 45–182)
Saturation Ratios: 19 % (ref 17.9–39.5)
TIBC: 336 ug/dL (ref 250–450)
UIBC: 272 ug/dL

## 2021-03-11 LAB — HEMOGLOBIN: Hemoglobin: 9.5 g/dL — ABNORMAL LOW (ref 13.0–17.0)

## 2021-03-11 MED ORDER — EPOETIN ALFA-EPBX 40000 UNIT/ML IJ SOLN
40000.0000 [IU] | Freq: Once | INTRAMUSCULAR | Status: AC
  Administered 2021-03-11: 40000 [IU] via SUBCUTANEOUS
  Filled 2021-03-11: qty 1

## 2021-04-14 ENCOUNTER — Inpatient Hospital Stay: Payer: Medicare HMO | Attending: Oncology

## 2021-04-14 ENCOUNTER — Inpatient Hospital Stay: Payer: Medicare HMO

## 2021-04-14 ENCOUNTER — Other Ambulatory Visit: Payer: Self-pay

## 2021-04-14 DIAGNOSIS — D649 Anemia, unspecified: Secondary | ICD-10-CM | POA: Diagnosis not present

## 2021-04-14 LAB — HEMOGLOBIN: Hemoglobin: 10.2 g/dL — ABNORMAL LOW (ref 13.0–17.0)

## 2021-05-07 ENCOUNTER — Other Ambulatory Visit: Payer: Self-pay

## 2021-05-07 ENCOUNTER — Inpatient Hospital Stay: Payer: Medicare HMO | Attending: Oncology

## 2021-05-07 DIAGNOSIS — D649 Anemia, unspecified: Secondary | ICD-10-CM | POA: Insufficient documentation

## 2021-05-07 DIAGNOSIS — N189 Chronic kidney disease, unspecified: Secondary | ICD-10-CM | POA: Diagnosis not present

## 2021-05-07 LAB — CBC WITH DIFFERENTIAL/PLATELET
Abs Immature Granulocytes: 0.03 10*3/uL (ref 0.00–0.07)
Basophils Absolute: 0.1 10*3/uL (ref 0.0–0.1)
Basophils Relative: 1 %
Eosinophils Absolute: 0.2 10*3/uL (ref 0.0–0.5)
Eosinophils Relative: 2 %
HCT: 30.1 % — ABNORMAL LOW (ref 39.0–52.0)
Hemoglobin: 9.9 g/dL — ABNORMAL LOW (ref 13.0–17.0)
Immature Granulocytes: 0 %
Lymphocytes Relative: 22 %
Lymphs Abs: 2.2 10*3/uL (ref 0.7–4.0)
MCH: 32.6 pg (ref 26.0–34.0)
MCHC: 32.9 g/dL (ref 30.0–36.0)
MCV: 99 fL (ref 80.0–100.0)
Monocytes Absolute: 0.8 10*3/uL (ref 0.1–1.0)
Monocytes Relative: 8 %
Neutro Abs: 6.6 10*3/uL (ref 1.7–7.7)
Neutrophils Relative %: 67 %
Platelets: 242 10*3/uL (ref 150–400)
RBC: 3.04 MIL/uL — ABNORMAL LOW (ref 4.22–5.81)
RDW: 15.1 % (ref 11.5–15.5)
WBC: 9.9 10*3/uL (ref 4.0–10.5)
nRBC: 0 % (ref 0.0–0.2)

## 2021-05-07 LAB — IRON AND TIBC
Iron: 41 ug/dL — ABNORMAL LOW (ref 45–182)
Saturation Ratios: 11 % — ABNORMAL LOW (ref 17.9–39.5)
TIBC: 361 ug/dL (ref 250–450)
UIBC: 320 ug/dL

## 2021-05-07 LAB — FERRITIN: Ferritin: 245 ng/mL (ref 24–336)

## 2021-05-11 ENCOUNTER — Inpatient Hospital Stay: Payer: Medicare HMO

## 2021-05-11 ENCOUNTER — Other Ambulatory Visit: Payer: Self-pay

## 2021-05-11 ENCOUNTER — Inpatient Hospital Stay (HOSPITAL_BASED_OUTPATIENT_CLINIC_OR_DEPARTMENT_OTHER): Payer: Medicare HMO | Admitting: Oncology

## 2021-05-11 VITALS — BP 141/51 | HR 56

## 2021-05-11 VITALS — BP 127/60 | HR 60 | Temp 97.2°F | Resp 18 | Wt 188.0 lb

## 2021-05-11 DIAGNOSIS — N183 Chronic kidney disease, stage 3 unspecified: Secondary | ICD-10-CM | POA: Diagnosis not present

## 2021-05-11 DIAGNOSIS — D472 Monoclonal gammopathy: Secondary | ICD-10-CM | POA: Diagnosis not present

## 2021-05-11 DIAGNOSIS — D649 Anemia, unspecified: Secondary | ICD-10-CM

## 2021-05-11 MED ORDER — SODIUM CHLORIDE 0.9 % IV SOLN: 200.0000 mg | Freq: Once | INTRAVENOUS | Status: DC

## 2021-05-11 MED ORDER — IRON SUCROSE 20 MG/ML IV SOLN
200.0000 mg | Freq: Once | INTRAVENOUS | Status: AC
  Administered 2021-05-11: 200 mg via INTRAVENOUS
  Filled 2021-05-11: qty 10

## 2021-05-11 MED ORDER — SODIUM CHLORIDE 0.9 % IV SOLN
Freq: Once | INTRAVENOUS | Status: AC
  Filled 2021-05-11: qty 250

## 2021-05-11 NOTE — Progress Notes (Signed)
Pt has no concerns/complaints at this time.

## 2021-05-11 NOTE — Progress Notes (Signed)
[place]  [phone] Fax:(336) [phone]  ID: [name] OB: [birth year]  MR#: [id number]  CSN#:[id number]  Patient Care Team: [name], MD as PCP - General (Internal Medicine)  CHIEF COMPLAINT: Anemia, unspecified.  INTERVAL HISTORY: Mr. 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  He receives 40,000 units monthly Retacrit for hemoglobin less than 10.  He intermittently has received IV Venofer last given on 12/03/2020.  Today, patient presents for routine follow-up. States he has been doing well.  Denies any new concerns.  Continues to suffer from bleeding hemorrhoids.  Uses Preparation H.  Denies any constipation or diarrhea.  Denies abdominal concerns.  Denies any bleeding except for occasional mild blood on tissue paper due to hemorrhoids.  Has shortness of breath that is stable.  REVIEW OF SYSTEMS:   Review of Systems  Constitutional:  Positive for malaise/fatigue. Negative for chills, fever and weight loss.  HENT:  Negative for congestion, ear pain and tinnitus.   Eyes: Negative.  Negative for blurred vision and double vision.  Respiratory:  Positive for shortness of breath. Negative for cough and sputum production.   Cardiovascular: Negative.  Negative for chest pain, palpitations and leg swelling.  Gastrointestinal: Negative.  Negative for abdominal pain, constipation, diarrhea, nausea and vomiting.       Intermittent mild hemorrhoidal bleeding.  Genitourinary:  Negative for dysuria, frequency and urgency.  Musculoskeletal:  Negative for back pain and falls.  Skin: Negative.  Negative for rash.  Neurological: Negative.  Negative for weakness and headaches.  Endo/Heme/Allergies: Negative.  Does not bruise/bleed easily.  Psychiatric/Behavioral: Negative.  Negative for  depression. The patient is not nervous/anxious and does not have insomnia.    As per HPI. Otherwise, a complete review of systems is negative.  PAST MEDICAL HISTORY: Past Medical History:  Diagnosis Date   Asthma    Atrial fibrillation (HCC)    BPH (benign prostatic hyperplasia)    COPD (chronic obstructive pulmonary disease) ([place])    Diabetes mellitus without complication (HCC)    Helicobacter pylori gastritis    Hyperlipidemia    Hypertension    Pneumonia    Right heart failure ([place])     PAST SURGICAL HISTORY: Past Surgical History:  Procedure Laterality Date   BACK SURGERY     x3 L3-L4    CARDIAC ELECTROPHYSIOLOGY MAPPING AND ABLATION     CARDIOVERSION N/A 06/22/2019   Procedure: CARDIOVERSION;  Surgeon: Fay Records, MD;  Location: [place];  Service: Cardiovascular;  Laterality: N/A;   COLONOSCOPY WITH PROPOFOL N/A 06/15/2017   Procedure: COLONOSCOPY WITH PROPOFOL;  Surgeon: Toledo, Benay Pike, MD;  Location: [facility] ENDOSCOPY;  Service: Gastroenterology;  Laterality: N/A;   ENTEROSCOPY N/A 11/24/2017   Procedure: ENTEROSCOPY;  Surgeon: Jonathon Bellows, MD;  Location[facility] ENDOSCOPY;  Service: Gastroenterology;  Laterality: N/A;   ESOPHAGOGASTRODUODENOSCOPY N/A 11/27/2017   Procedure: ESOPHAGOGASTRODUODENOSCOPY (EGD);  Surgeon: Lucilla Lame, MD;  Location: [facility] ENDOSCOPY;  Service: Endoscopy;  Laterality: N/A;   ESOPHAGOGASTRODUODENOSCOPY (EGD) WITH PROPOFOL N/A 06/02/2017   Procedure: ESOPHAGOGASTRODUODENOSCOPY (EGD) WITH PROPOFOL;  Surgeon: Toledo, Benay Pike, MD;  Location: [facility] ENDOSCOPY;  Service: Gastroenterology;  Laterality: N/A;   GIVENS CAPSULE STUDY N/A 11/25/2017   Procedure: GIVENS CAPSULE STUDY;  Surgeon: Jonathon Bellows, MD;  Location: [facility] ENDOSCOPY;  Service: Gastroenterology;  Laterality: N/A;   HAND SURGERY  RIGHT/LEFT HEART CATH AND CORONARY ANGIOGRAPHY N/A 01/17/2020   Procedure: RIGHT/LEFT HEART CATH AND CORONARY ANGIOGRAPHY;  Surgeon: Corey Skains, MD;   Location: [place] CV LAB;  Service: Cardiovascular;  Laterality: N/A;    FAMILY HISTORY: Family History  Problem Relation Age of Onset   Heart disease Mother    Cancer Father    Cancer Paternal Grandfather     ADVANCED DIRECTIVES (Y/N):  N  HEALTH MAINTENANCE: Social History   Tobacco Use   Smoking status: Former    Packs/day: 1.00    Years: 30.00    Pack years: 30.00    Types: Cigarettes    Quit date: 1995    Years since quitting: 27.8   Smokeless tobacco: Never  Vaping Use   Vaping Use: Never used  Substance Use Topics   Alcohol use: No    Alcohol/week: 0.0 standard drinks    Comment: occasional   Drug use: No     Colonoscopy:  PAP:  Bone density:  Lipid panel:  Allergies  Allergen Reactions   Shrimp [Shellfish Allergy] Anaphylaxis   Shrimp Extract Allergy Skin Test     Current Outpatient Medications  Medication Sig Dispense Refill   acetaminophen (TYLENOL) 500 MG tablet Take 1,000 mg by mouth every 6 (six) hours as needed for moderate pain or headache.     albuterol (VENTOLIN HFA) 108 (90 Base) MCG/ACT inhaler Inhale 2 puffs into the lungs daily.     allopurinol (ZYLOPRIM) 100 MG tablet Take 100 mg by mouth daily.     ascorbic acid (VITAMIN C) 500 MG tablet Take 500 mg by mouth daily.     atorvastatin (LIPITOR) 20 MG tablet Take 20 mg by mouth at bedtime.      BREO ELLIPTA 100-25 MCG/INH AEPB Inhale 1 puff into the lungs daily.     Cholecalciferol (DIALYVITE VITAMIN D 5000) 125 MCG (5000 UT) capsule Take 5,000 Units by mouth daily.     colchicine 0.6 MG tablet Take 0.6 mg by mouth daily as needed (gout flare).     ELIQUIS 5 MG TABS tablet SMARTSIG:1 Tablet(s) By Mouth Every 12 Hours     finasteride (PROSCAR) 5 MG tablet Take 1 tablet (5 mg total) by mouth daily. 30 tablet 11   furosemide (LASIX) 80 MG tablet Take 40 mg by mouth daily.     insulin aspart (NOVOLOG) 100 UNIT/ML FlexPen Inject 14-26 Units into the skin 3 (three) times daily with meals.       insulin degludec (TRESIBA FLEXTOUCH) 200 UNIT/ML FlexTouch Pen Inject 46 Units into the skin at bedtime.     Insulin Pen Needle 31G X 8 MM MISC BD Ultra-Fine Short Pen Needle 31 gauge x 5/16"     lisinopril (ZESTRIL) 20 MG tablet Take 10 mg by mouth at bedtime.      magnesium oxide (MAG-OX) 400 MG tablet Take 1 tablet by mouth 3 (three) times daily.     Magnesium Oxide 400 (240 Mg) MG TABS Take 400 mg by mouth in the morning, at noon, and at bedtime.     metoprolol tartrate (LOPRESSOR) 25 MG tablet Take 50 mg by mouth 2 (two) times daily.      pantoprazole (PROTONIX) 40 MG tablet Take 1 tablet (40 mg total) by mouth 2 (two) times daily before a meal. 60 tablet 0   sildenafil (REVATIO) 20 MG tablet Take 20 mg by mouth 3 (three) times daily.     sodium chloride (OCEAN) 0.65 % SOLN nasal spray  Place 1 spray into both nostrils as needed for congestion.     TYVASO REFILL 0.6 MG/ML SOLN Inhale 0.6 mg into the lungs in the morning, at noon, in the evening, and at bedtime.     vitamin B-12 (CYANOCOBALAMIN) 1000 MCG tablet Take 1,000 mcg by mouth daily.     No current facility-administered medications for this visit.   Facility-Administered Medications Ordered in Other Visits  Medication Dose Route Frequency Provider Last Rate Last Admin   iron sucrose (VENOFER) injection 200 mg  200 mg Intravenous Once Faythe Casa E, NP   200 mg at 05/11/21 1405    OBJECTIVE: Vitals:   05/11/21 1309  BP: 127/60  Pulse: 60  Resp: 18  Temp: (!) 97.2 F (36.2 C)  SpO2: 99%     Body mass index is 28.59 kg/m.    ECOG FS:1 - Symptomatic but completely ambulatory  Physical Exam Constitutional:      Appearance: Normal appearance.     Comments: In wheelchair.  HENT:     Head: Normocephalic and atraumatic.  Eyes:     Pupils: Pupils are equal, round, and reactive to light.  Cardiovascular:     Rate and Rhythm: Normal rate and regular rhythm.     Heart sounds: Normal heart sounds. No murmur  heard. Pulmonary:     Effort: Pulmonary effort is normal.     Breath sounds: Normal breath sounds. No wheezing.  Abdominal:     General: Bowel sounds are normal. There is no distension.     Palpations: Abdomen is soft.     Tenderness: There is no abdominal tenderness.  Musculoskeletal:        General: Normal range of motion.     Cervical back: Normal range of motion.  Skin:    General: Skin is warm and dry.     Findings: No rash.  Neurological:     Mental Status: He is alert and oriented to person, place, and time.  Psychiatric:        Judgment: Judgment normal.      LAB RESULTS:  Lab Results  Component Value Date   NA 136 06/25/2020   K 4.6 06/25/2020   CL 101 06/25/2020   CO2 24 06/25/2020   GLUCOSE 119 (H) 06/25/2020   BUN 31 (H) 06/25/2020   CREATININE 2.26 (H) 06/25/2020   CALCIUM 9.2 06/25/2020   PROT 5.3 (L) 06/23/2019   ALBUMIN 2.1 (L) 06/23/2019   AST 27 06/23/2019   ALT 26 06/23/2019   ALKPHOS 31 (L) 06/23/2019   BILITOT 1.4 (H) 06/23/2019   GFRNONAA 29 (L) 06/25/2020   GFRAA 39 (L) 10/18/2019    Lab Results  Component Value Date   WBC 9.9 05/07/2021   NEUTROABS 6.6 05/07/2021   HGB 9.9 (L) 05/07/2021   HCT 30.1 (L) 05/07/2021   MCV 99.0 05/07/2021   PLT 242 05/07/2021   Lab Results  Component Value Date   IRON 41 (L) 05/07/2021   TIBC 361 05/07/2021   IRONPCTSAT 11 (L) 05/07/2021   Lab Results  Component Value Date   FERRITIN 245 05/07/2021     STUDIES: No results found.   ASSESSMENT: Anemia, unspecified.  PLAN:   1. Anemia, unspecified:  Slow decline in his hemoglobin over the past couple years.  Work-up completed included bone marrow biopsy from 06/25/2020 which was essentially negative.  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  Patient is  currently under surveillance at this time.  He receives intermittent IV iron last in May 2022.   Labs from 05/07/2021 show hemoglobin of 9.9, ferritin 245 with iron saturation of 11%. Return to clinic monthly for labs and possible Retacrit.  He would also benefit from 2 doses of IV Venofer given iron saturations are 11%.   2. 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  Bone marrow biopsy in December 2021 revealed 1% plasma cells.  This is likely clinically insignificant but will monitor yearly.  3.  Chronic kidney disease-he receives 40,000 units Retacrit for hemoglobin less than 10.  Last Retacrit was on 03/11/2021.  Labs from 05/07/2021 show hemoglobin of 9.9.  Proceed with Retacrit.  Disposition-given patient cannot receive Venofer and Retacrit on the same day, proceed with Venofer X 2 followed by Retacrit next week.  Continue monthly labs with possible Retacrit and MD assessment in 4 months with lab work with possible Retacrit/Venofer.  I spent 25 minutes dedicated to the care of this patient (face-to-face and non-face-to-face) on the date of the encounter to include what is described in the assessment and plan.  Patient expressed understanding and was in agreement with this plan. He also understands that He can call clinic at any time with any questions, concerns, or complaints.    Jacquelin Hawking, NP   05/11/2021 2:07 PM

## 2021-05-11 NOTE — Patient Instructions (Signed)

## 2021-05-14 ENCOUNTER — Inpatient Hospital Stay: Payer: Medicare HMO

## 2021-05-14 ENCOUNTER — Other Ambulatory Visit: Payer: Self-pay

## 2021-05-14 VITALS — BP 151/74 | HR 57

## 2021-05-14 DIAGNOSIS — D649 Anemia, unspecified: Secondary | ICD-10-CM | POA: Diagnosis not present

## 2021-05-14 MED ORDER — EPOETIN ALFA-EPBX 40000 UNIT/ML IJ SOLN
40000.0000 [IU] | Freq: Once | INTRAMUSCULAR | Status: AC
  Administered 2021-05-14: 40000 [IU] via SUBCUTANEOUS
  Filled 2021-05-14: qty 1

## 2021-05-18 ENCOUNTER — Ambulatory Visit: Payer: Medicare HMO

## 2021-05-25 ENCOUNTER — Inpatient Hospital Stay: Payer: Medicare HMO | Attending: Oncology

## 2021-05-25 DIAGNOSIS — D649 Anemia, unspecified: Secondary | ICD-10-CM | POA: Insufficient documentation

## 2021-05-25 DIAGNOSIS — N189 Chronic kidney disease, unspecified: Secondary | ICD-10-CM | POA: Insufficient documentation

## 2021-06-15 ENCOUNTER — Inpatient Hospital Stay: Payer: Medicare HMO

## 2021-06-15 ENCOUNTER — Other Ambulatory Visit: Payer: Self-pay

## 2021-06-15 VITALS — BP 132/63 | HR 59

## 2021-06-15 DIAGNOSIS — D649 Anemia, unspecified: Secondary | ICD-10-CM | POA: Diagnosis not present

## 2021-06-15 DIAGNOSIS — N189 Chronic kidney disease, unspecified: Secondary | ICD-10-CM | POA: Diagnosis present

## 2021-06-15 LAB — HEMOGLOBIN: Hemoglobin: 9.6 g/dL — ABNORMAL LOW (ref 13.0–17.0)

## 2021-06-15 MED ORDER — EPOETIN ALFA-EPBX 40000 UNIT/ML IJ SOLN
40000.0000 [IU] | Freq: Once | INTRAMUSCULAR | Status: AC
  Administered 2021-06-15: 14:00:00 40000 [IU] via SUBCUTANEOUS
  Filled 2021-06-15: qty 1

## 2021-07-08 ENCOUNTER — Other Ambulatory Visit: Payer: Self-pay | Admitting: *Deleted

## 2021-07-08 DIAGNOSIS — D649 Anemia, unspecified: Secondary | ICD-10-CM

## 2021-07-08 DIAGNOSIS — D472 Monoclonal gammopathy: Secondary | ICD-10-CM

## 2021-07-15 ENCOUNTER — Inpatient Hospital Stay: Payer: Medicare HMO

## 2021-07-15 ENCOUNTER — Inpatient Hospital Stay: Payer: Medicare HMO | Attending: Oncology

## 2021-07-15 ENCOUNTER — Other Ambulatory Visit: Payer: Self-pay

## 2021-07-15 VITALS — BP 123/61 | HR 68

## 2021-07-15 DIAGNOSIS — D649 Anemia, unspecified: Secondary | ICD-10-CM | POA: Insufficient documentation

## 2021-07-15 DIAGNOSIS — N183 Chronic kidney disease, stage 3 unspecified: Secondary | ICD-10-CM | POA: Insufficient documentation

## 2021-07-15 DIAGNOSIS — D472 Monoclonal gammopathy: Secondary | ICD-10-CM

## 2021-07-15 LAB — CBC WITH DIFFERENTIAL/PLATELET
Abs Immature Granulocytes: 0.05 10*3/uL (ref 0.00–0.07)
Basophils Absolute: 0.1 10*3/uL (ref 0.0–0.1)
Basophils Relative: 0 %
Eosinophils Absolute: 0.1 10*3/uL (ref 0.0–0.5)
Eosinophils Relative: 1 %
HCT: 30.4 % — ABNORMAL LOW (ref 39.0–52.0)
Hemoglobin: 9.9 g/dL — ABNORMAL LOW (ref 13.0–17.0)
Immature Granulocytes: 0 %
Lymphocytes Relative: 12 %
Lymphs Abs: 1.7 10*3/uL (ref 0.7–4.0)
MCH: 32.1 pg (ref 26.0–34.0)
MCHC: 32.6 g/dL (ref 30.0–36.0)
MCV: 98.7 fL (ref 80.0–100.0)
Monocytes Absolute: 0.9 10*3/uL (ref 0.1–1.0)
Monocytes Relative: 6 %
Neutro Abs: 11.5 10*3/uL — ABNORMAL HIGH (ref 1.7–7.7)
Neutrophils Relative %: 81 %
Platelets: 258 10*3/uL (ref 150–400)
RBC: 3.08 MIL/uL — ABNORMAL LOW (ref 4.22–5.81)
RDW: 15.8 % — ABNORMAL HIGH (ref 11.5–15.5)
WBC: 14.3 10*3/uL — ABNORMAL HIGH (ref 4.0–10.5)
nRBC: 0 % (ref 0.0–0.2)

## 2021-07-15 LAB — IRON AND TIBC
Iron: 34 ug/dL — ABNORMAL LOW (ref 45–182)
Saturation Ratios: 11 % — ABNORMAL LOW (ref 17.9–39.5)
TIBC: 316 ug/dL (ref 250–450)
UIBC: 282 ug/dL

## 2021-07-15 LAB — FERRITIN: Ferritin: 251 ng/mL (ref 24–336)

## 2021-07-15 MED ORDER — EPOETIN ALFA-EPBX 40000 UNIT/ML IJ SOLN
40000.0000 [IU] | Freq: Once | INTRAMUSCULAR | Status: AC
  Administered 2021-07-15: 14:00:00 40000 [IU] via SUBCUTANEOUS
  Filled 2021-07-15: qty 1

## 2021-07-30 ENCOUNTER — Other Ambulatory Visit: Payer: Self-pay

## 2021-07-30 ENCOUNTER — Ambulatory Visit: Payer: Medicare HMO | Admitting: Urology

## 2021-07-30 ENCOUNTER — Encounter: Payer: Self-pay | Admitting: Urology

## 2021-07-30 VITALS — BP 142/86 | HR 79 | Ht 68.0 in | Wt 180.0 lb

## 2021-07-30 DIAGNOSIS — N401 Enlarged prostate with lower urinary tract symptoms: Secondary | ICD-10-CM | POA: Diagnosis not present

## 2021-07-30 DIAGNOSIS — R338 Other retention of urine: Secondary | ICD-10-CM | POA: Diagnosis not present

## 2021-07-30 LAB — BLADDER SCAN AMB NON-IMAGING: Scan Result: 59

## 2021-07-30 NOTE — Progress Notes (Signed)
07/30/2021 1:23 PM   Jay Morris June 25, 1946 [id number]  Referring provider: [name], MD [place] [facility] [place],  [place] [address]  Chief Complaint  Patient presents with   Benign Prostatic Hypertrophy    Urologic history:  1.  Chronic urinary retention Failed several voiding trials Elected CIC; voids between caths  HPI: 76 y.o. male presents for 60-monthfollow-up.  No complaints since last visit At last visit with Sam she recommended increasing to 3 times daily catheterization however they did not increase No problems with recurrent UTI or hematuria Renal ultrasound ordered by nephrology and performed July 2022 showed no hydronephrosis or upper tract changes   PMH: Past Medical History:  Diagnosis Date   Asthma    Atrial fibrillation (HCC)    BPH (benign prostatic hyperplasia)    COPD (chronic obstructive pulmonary disease) ([place]    Diabetes mellitus without complication ([place]    Helicobacter pylori gastritis    Hyperlipidemia    Hypertension    Pneumonia    Right heart failure ([place]     Surgical History: Past Surgical History:  Procedure Laterality Date   BACK SURGERY     x3 L3-L4    CARDIAC ELECTROPHYSIOLOGY MAPPING AND ABLATION     CARDIOVERSION N/A 06/22/2019   Procedure: CARDIOVERSION;  Surgeon: RFay Records MD;  [place]  Service: Cardiovascular;  Laterality: N/A;   COLONOSCOPY WITH PROPOFOL N/A 06/15/2017   Procedure: COLONOSCOPY WITH PROPOFOL;  Surgeon: Toledo, TBenay Pike MD;  Location: [facility] ENDOSCOPY;  Service: Gastroenterology;  Laterality: N/A;   ENTEROSCOPY N/A 11/24/2017   Procedure: ENTEROSCOPY;  Surgeon: AJonathon Bellows MD;  [facility];  Service: Gastroenterology;  Laterality: N/A;   ESOPHAGOGASTRODUODENOSCOPY N/A 11/27/2017   Procedure: ESOPHAGOGASTRODUODENOSCOPY (EGD);  Surgeon: WLucilla Lame MD;  [facility]  Service: Endoscopy;  Laterality: N/A;    ESOPHAGOGASTRODUODENOSCOPY (EGD) WITH PROPOFOL N/A 06/02/2017   Procedure: ESOPHAGOGASTRODUODENOSCOPY (EGD) WITH PROPOFOL;  Surgeon: Toledo, TBenay Pike MD;  Location: [facility] ENDOSCOPY;  Service: Gastroenterology;  Laterality: N/A;   GIVENS CAPSULE STUDY N/A 11/25/2017   Procedure: GIVENS CAPSULE STUDY;  Surgeon: AJonathon Bellows MD;  [facility]  Service: Gastro[facility]ology;  Laterality: N/A;   HAND SURGERY     RIGHT/LEFT HEART CATH AND CORONARY ANGIOGRAPHY N/A 01/17/2020   Procedure: RIGHT/LEFT HEART CATH AND CORONARY ANGIOGRAPHY;  Surgeon: KCorey Skains MD;  [facility];  Service: Cardiovascular;  Laterality: N/A;    Home Medications:  Allergies as of 07/30/2021       Reactions   Shrimp [shellfish Allergy] Anaphylaxis   Shrimp Extract Allergy Skin Test         Medication List        Accurate as of July 30, 2021  1:23 PM. If you have any questions, ask your nurse or doctor.          acetaminophen 500 MG tablet Commonly known as: TYLENOL Take 1,000 mg by mouth every 6 (six) hours as needed for moderate pain or headache.   albuterol 108 (90 Base) MCG/ACT inhaler Commonly known as: VENTOLIN HFA Inhale 2 puffs into the lungs daily.   allopurinol 100 MG tablet Commonly known as: ZYLOPRIM Take 100 mg by mouth daily.   ascorbic acid 500 MG tablet Commonly known as: VITAMIN C Take 500 mg by mouth daily.   atorvastatin 20 MG tablet Commonly known as: LIPITOR Take 20 mg by mouth at bedtime.   Breo Ellipta 100-25 MCG/ACT Aepb Generic  drug: fluticasone furoate-vilanterol Inhale 1 puff into the lungs daily.   colchicine 0.6 MG tablet Take 0.6 mg by mouth daily as needed (gout flare).   Dialyvite Vitamin D 5000 125 MCG (5000 UT) capsule Generic drug: Cholecalciferol Take 5,000 Units by mouth daily.   Eliquis 5 MG Tabs tablet Generic drug: apixaban SMARTSIG:1 Tablet(s) By Mouth Every 12 Hours   finasteride 5 MG tablet Commonly known as:  PROSCAR Take 1 tablet (5 mg total) by mouth daily.   furosemide 80 MG tablet Commonly known as: LASIX Take 40 mg by mouth daily.   insulin aspart 100 UNIT/ML FlexPen Commonly known as: NOVOLOG Inject 14-26 Units into the skin 3 (three) times daily with meals.   Insulin Pen Needle 31G X 8 MM Misc BD Ultra-Fine Short Pen Needle 31 gauge x 5/16"   lisinopril 20 MG tablet Commonly known as: ZESTRIL Take 10 mg by mouth at bedtime.   magnesium oxide 400 (240 Mg) MG tablet Commonly known as: MAG-OX Take 400 mg by mouth in the morning, at noon, and at bedtime.   magnesium oxide 400 MG tablet Commonly known as: MAG-OX Take 1 tablet by mouth 3 (three) times daily.   metoprolol tartrate 25 MG tablet Commonly known as: LOPRESSOR Take 50 mg by mouth 2 (two) times daily.   pantoprazole 40 MG tablet Commonly known as: PROTONIX Take 1 tablet (40 mg total) by mouth 2 (two) times daily before a meal.   sildenafil 20 MG tablet Commonly known as: REVATIO Take 20 mg by mouth 3 (three) times daily.   sodium chloride 0.65 % Soln nasal spray Commonly known as: OCEAN Place 1 spray into both nostrils as needed for congestion.   Tyler Aas FlexTouch 200 UNIT/ML FlexTouch Pen Generic drug: insulin degludec Inject 46 Units into the skin at bedtime.   Tyvaso Refill 0.6 MG/ML Soln Generic drug: Treprostinil Inhale 0.6 mg into the lungs in the morning, at noon, in the evening, and at bedtime.   vitamin B-12 1000 MCG tablet Commonly known as: CYANOCOBALAMIN Take 1,000 mcg by mouth daily.        Allergies:  Allergies  Allergen Reactions   Shrimp [Shellfish Allergy] Anaphylaxis   Shrimp Extract Allergy Skin Test     Family History: Family History  Problem Relation Age of Onset   Heart disease Mother    Cancer Father    Cancer Paternal Grandfather     Social History:  reports that he quit smoking about 28 years ago. His smoking use included cigarettes. He has a 30.00 pack-year  smoking history. He has never used smokeless tobacco. He reports that he does not drink alcohol and does not use drugs.   Physical Exam: BP (!) 142/86    Pulse 79    Ht _0  (1.727 m)    Wt 180 lb (81.6 kg)    BMI 27.37 kg/m   Constitutional:  Alert and oriented, No acute distress. HEENT: [place] AT, moist mucus membranes.  Trachea midline, no masses. Cardiovascular: No clubbing, cyanosis, or edema. Respiratory: Normal respiratory effort, no increased work of breathing. Psychiatric: Normal mood and affect.    Pertinent Imaging:  US RENAL  Narrative CLINICAL DATA:  76 year old male with chronic kidney disease.  EXAM: RENAL / URINARY TRACT ULTRASOUND COMPLETE  COMPARISON:  CT of the abdomen pelvis dated 06/17/2019.  FINDINGS: Right Kidney:  Renal measurements: 11.1 x 4.6 x 5.3 cm = volume: 142 mL. Echogenicity within normal limits. No mass or hydronephrosis visualized.  Left Kidney:  Renal measurements: 12.1 x 5.4 x 5.0 cm = volume: 170 mL. Echogenicity within normal limits. No mass or hydronephrosis visualized.  Bladder:  There is mild trabeculated appearance of the bladder wall.  Other:  The prostate gland measures approximately 2 x 2.3 cm.  There is diffuse increased liver echogenicity most commonly seen in the setting of fatty infiltration. Superimposed inflammation or fibrosis is not excluded. Clinical correlation is recommended.  IMPRESSION: 1. Unremarkable kidneys. 2. Fatty liver.   Electronically Signed By: Anner Crete M.D. On: 02/01/2021 14:42    Assessment & Plan:    1. Benign prostatic hyperplasia with urinary retention Bladder scan today 59 mL No upper tract abnormalities on RUS Continue twice daily intermittent catheterization Will move to annual follow-up and he can call earlier for any problems   Abbie Sons, MD  [facility] [address] [place], [place] [address] [phone]

## 2021-08-03 ENCOUNTER — Emergency Department: Payer: Medicare HMO

## 2021-08-03 ENCOUNTER — Other Ambulatory Visit: Payer: Self-pay

## 2021-08-03 ENCOUNTER — Inpatient Hospital Stay
Admission: EM | Admit: 2021-08-03 | Discharge: 2021-08-07 | DRG: 377 | Disposition: A | Discharge status: Home / Self Care | Payer: Medicare HMO | Attending: Student | Admitting: Student

## 2021-08-03 DIAGNOSIS — E1122 Type 2 diabetes mellitus with diabetic chronic kidney disease: Secondary | ICD-10-CM | POA: Diagnosis present

## 2021-08-03 DIAGNOSIS — I272 Pulmonary hypertension, unspecified: Secondary | ICD-10-CM | POA: Diagnosis present

## 2021-08-03 DIAGNOSIS — Z79899 Other long term (current) drug therapy: Secondary | ICD-10-CM

## 2021-08-03 DIAGNOSIS — D72829 Elevated white blood cell count, unspecified: Secondary | ICD-10-CM | POA: Diagnosis present

## 2021-08-03 DIAGNOSIS — I482 Chronic atrial fibrillation, unspecified: Secondary | ICD-10-CM | POA: Diagnosis present

## 2021-08-03 DIAGNOSIS — K219 Gastro-esophageal reflux disease without esophagitis: Secondary | ICD-10-CM | POA: Diagnosis present

## 2021-08-03 DIAGNOSIS — Z9981 Dependence on supplemental oxygen: Secondary | ICD-10-CM | POA: Diagnosis not present

## 2021-08-03 DIAGNOSIS — K31811 Angiodysplasia of stomach and duodenum with bleeding: Secondary | ICD-10-CM | POA: Diagnosis present

## 2021-08-03 DIAGNOSIS — I10 Essential (primary) hypertension: Secondary | ICD-10-CM | POA: Diagnosis present

## 2021-08-03 DIAGNOSIS — K920 Hematemesis: Secondary | ICD-10-CM | POA: Diagnosis present

## 2021-08-03 DIAGNOSIS — J9621 Acute and chronic respiratory failure with hypoxia: Secondary | ICD-10-CM | POA: Diagnosis present

## 2021-08-03 DIAGNOSIS — N189 Chronic kidney disease, unspecified: Secondary | ICD-10-CM | POA: Diagnosis present

## 2021-08-03 DIAGNOSIS — K921 Melena: Secondary | ICD-10-CM | POA: Diagnosis not present

## 2021-08-03 DIAGNOSIS — I5032 Chronic diastolic (congestive) heart failure: Secondary | ICD-10-CM | POA: Diagnosis present

## 2021-08-03 DIAGNOSIS — Z91013 Allergy to seafood: Secondary | ICD-10-CM

## 2021-08-03 DIAGNOSIS — I5082 Biventricular heart failure: Secondary | ICD-10-CM | POA: Diagnosis present

## 2021-08-03 DIAGNOSIS — E785 Hyperlipidemia, unspecified: Secondary | ICD-10-CM | POA: Diagnosis present

## 2021-08-03 DIAGNOSIS — K922 Gastrointestinal hemorrhage, unspecified: Secondary | ICD-10-CM | POA: Diagnosis not present

## 2021-08-03 DIAGNOSIS — J449 Chronic obstructive pulmonary disease, unspecified: Secondary | ICD-10-CM | POA: Diagnosis present

## 2021-08-03 DIAGNOSIS — N200 Calculus of kidney: Secondary | ICD-10-CM

## 2021-08-03 DIAGNOSIS — Z20822 Contact with and (suspected) exposure to covid-19: Secondary | ICD-10-CM | POA: Diagnosis present

## 2021-08-03 DIAGNOSIS — N400 Enlarged prostate without lower urinary tract symptoms: Secondary | ICD-10-CM | POA: Diagnosis present

## 2021-08-03 DIAGNOSIS — I2510 Atherosclerotic heart disease of native coronary artery without angina pectoris: Secondary | ICD-10-CM | POA: Diagnosis present

## 2021-08-03 DIAGNOSIS — Z794 Long term (current) use of insulin: Secondary | ICD-10-CM

## 2021-08-03 DIAGNOSIS — N184 Chronic kidney disease, stage 4 (severe): Secondary | ICD-10-CM | POA: Diagnosis present

## 2021-08-03 DIAGNOSIS — D62 Acute posthemorrhagic anemia: Secondary | ICD-10-CM | POA: Diagnosis present

## 2021-08-03 DIAGNOSIS — Z8249 Family history of ischemic heart disease and other diseases of the circulatory system: Secondary | ICD-10-CM

## 2021-08-03 DIAGNOSIS — D500 Iron deficiency anemia secondary to blood loss (chronic): Secondary | ICD-10-CM

## 2021-08-03 DIAGNOSIS — Z87891 Personal history of nicotine dependence: Secondary | ICD-10-CM | POA: Diagnosis not present

## 2021-08-03 DIAGNOSIS — I130 Hypertensive heart and chronic kidney disease with heart failure and stage 1 through stage 4 chronic kidney disease, or unspecified chronic kidney disease: Secondary | ICD-10-CM | POA: Diagnosis present

## 2021-08-03 DIAGNOSIS — E1151 Type 2 diabetes mellitus with diabetic peripheral angiopathy without gangrene: Secondary | ICD-10-CM | POA: Diagnosis present

## 2021-08-03 DIAGNOSIS — N401 Enlarged prostate with lower urinary tract symptoms: Secondary | ICD-10-CM | POA: Diagnosis present

## 2021-08-03 DIAGNOSIS — Z7901 Long term (current) use of anticoagulants: Secondary | ICD-10-CM

## 2021-08-03 DIAGNOSIS — K5900 Constipation, unspecified: Secondary | ICD-10-CM | POA: Diagnosis present

## 2021-08-03 DIAGNOSIS — D631 Anemia in chronic kidney disease: Secondary | ICD-10-CM | POA: Diagnosis present

## 2021-08-03 DIAGNOSIS — I2729 Other secondary pulmonary hypertension: Secondary | ICD-10-CM | POA: Diagnosis present

## 2021-08-03 DIAGNOSIS — E1129 Type 2 diabetes mellitus with other diabetic kidney complication: Secondary | ICD-10-CM | POA: Diagnosis present

## 2021-08-03 DIAGNOSIS — M109 Gout, unspecified: Secondary | ICD-10-CM | POA: Diagnosis present

## 2021-08-03 LAB — CBC
HCT: 18.8 % — ABNORMAL LOW (ref 39.0–52.0)
HCT: 23.8 % — ABNORMAL LOW (ref 39.0–52.0)
Hemoglobin: 6.2 g/dL — ABNORMAL LOW (ref 13.0–17.0)
Hemoglobin: 7.9 g/dL — ABNORMAL LOW (ref 13.0–17.0)
MCH: 32.8 pg (ref 26.0–34.0)
MCH: 31.1 pg (ref 26.0–34.0)
MCHC: 33 g/dL (ref 30.0–36.0)
MCHC: 33.2 g/dL (ref 30.0–36.0)
MCV: 99.5 fL (ref 80.0–100.0)
MCV: 93.7 fL (ref 80.0–100.0)
Platelets: 284 10*3/uL (ref 150–400)
Platelets: 264 10*3/uL (ref 150–400)
RBC: 1.89 MIL/uL — ABNORMAL LOW (ref 4.22–5.81)
RBC: 2.54 MIL/uL — ABNORMAL LOW (ref 4.22–5.81)
RDW: 16.2 % — ABNORMAL HIGH (ref 11.5–15.5)
RDW: 18.7 % — ABNORMAL HIGH (ref 11.5–15.5)
WBC: 15.2 10*3/uL — ABNORMAL HIGH (ref 4.0–10.5)
WBC: 11 10*3/uL — ABNORMAL HIGH (ref 4.0–10.5)
nRBC: 0 % (ref 0.0–0.2)
nRBC: 0 % (ref 0.0–0.2)

## 2021-08-03 LAB — CBC WITH DIFFERENTIAL/PLATELET
Abs Immature Granulocytes: 0.05 10*3/uL (ref 0.00–0.07)
Basophils Absolute: 0.1 10*3/uL (ref 0.0–0.1)
Basophils Relative: 1 %
Eosinophils Absolute: 0.1 10*3/uL (ref 0.0–0.5)
Eosinophils Relative: 0 %
HCT: 19.4 % — ABNORMAL LOW (ref 39.0–52.0)
Hemoglobin: 6.3 g/dL — ABNORMAL LOW (ref 13.0–17.0)
Immature Granulocytes: 0 %
Lymphocytes Relative: 23 %
Lymphs Abs: 2.9 10*3/uL (ref 0.7–4.0)
MCH: 32.3 pg (ref 26.0–34.0)
MCHC: 32.5 g/dL (ref 30.0–36.0)
MCV: 99.5 fL (ref 80.0–100.0)
Monocytes Absolute: 0.6 10*3/uL (ref 0.1–1.0)
Monocytes Relative: 5 %
Neutro Abs: 9 10*3/uL — ABNORMAL HIGH (ref 1.7–7.7)
Neutrophils Relative %: 71 %
Platelets: 293 10*3/uL (ref 150–400)
RBC: 1.95 MIL/uL — ABNORMAL LOW (ref 4.22–5.81)
RDW: 16.2 % — ABNORMAL HIGH (ref 11.5–15.5)
WBC: 12.7 10*3/uL — ABNORMAL HIGH (ref 4.0–10.5)
nRBC: 0 % (ref 0.0–0.2)

## 2021-08-03 LAB — PREPARE RBC (CROSSMATCH)

## 2021-08-03 LAB — URINALYSIS, COMPLETE (UACMP) WITH MICROSCOPIC
Bilirubin Urine: NEGATIVE
Glucose, UA: NEGATIVE mg/dL
Hgb urine dipstick: NEGATIVE
Ketones, ur: NEGATIVE mg/dL
Leukocytes,Ua: NEGATIVE
Nitrite: NEGATIVE
Protein, ur: NEGATIVE mg/dL
Specific Gravity, Urine: 1.014 (ref 1.005–1.030)
pH: 5 (ref 5.0–8.0)

## 2021-08-03 LAB — BASIC METABOLIC PANEL
Anion gap: 9 (ref 5–15)
BUN: 63 mg/dL — ABNORMAL HIGH (ref 8–23)
CO2: 23 mmol/L (ref 22–32)
Calcium: 8.8 mg/dL — ABNORMAL LOW (ref 8.9–10.3)
Chloride: 106 mmol/L (ref 98–111)
Creatinine, Ser: 2.28 mg/dL — ABNORMAL HIGH (ref 0.61–1.24)
GFR, Estimated: 29 mL/min — ABNORMAL LOW (ref >60)
Glucose, Bld: 153 mg/dL — ABNORMAL HIGH (ref 70–99)
Potassium: 4.2 mmol/L (ref 3.5–5.1)
Sodium: 138 mmol/L (ref 135–145)

## 2021-08-03 LAB — HEPATIC FUNCTION PANEL
ALT: 13 U/L (ref 0–44)
AST: 12 U/L — ABNORMAL LOW (ref 15–41)
Albumin: 3.5 g/dL (ref 3.5–5.0)
Alkaline Phosphatase: 26 U/L — ABNORMAL LOW (ref 38–126)
Bilirubin, Direct: <0.1 mg/dL (ref 0.0–0.2)
Total Bilirubin: 0.5 mg/dL (ref 0.3–1.2)
Total Protein: 6.1 g/dL — ABNORMAL LOW (ref 6.5–8.1)

## 2021-08-03 LAB — TROPONIN I (HIGH SENSITIVITY)
Troponin I (High Sensitivity): 5 ng/L (ref <18)
Troponin I (High Sensitivity): 4 ng/L (ref <18)

## 2021-08-03 LAB — CBG MONITORING, ED
Glucose-Capillary: 176 mg/dL — ABNORMAL HIGH (ref 70–99)
Glucose-Capillary: 135 mg/dL — ABNORMAL HIGH (ref 70–99)
Glucose-Capillary: 136 mg/dL — ABNORMAL HIGH (ref 70–99)

## 2021-08-03 LAB — PROTIME-INR
INR: 1.3 — ABNORMAL HIGH (ref 0.8–1.2)
Prothrombin Time: 15.9 seconds — ABNORMAL HIGH (ref 11.4–15.2)

## 2021-08-03 LAB — RESP PANEL BY RT-PCR (FLU A&B, COVID) ARPGX2
Influenza A by PCR: NEGATIVE
Influenza B by PCR: NEGATIVE
SARS Coronavirus 2 by RT PCR: NEGATIVE

## 2021-08-03 LAB — RETICULOCYTES
Immature Retic Fract: 29.5 % — ABNORMAL HIGH (ref 2.3–15.9)
RBC.: 1.93 MIL/uL — ABNORMAL LOW (ref 4.22–5.81)
Retic Count, Absolute: 49 10*3/uL (ref 19.0–186.0)
Retic Ct Pct: 2.5 % (ref 0.4–3.1)

## 2021-08-03 LAB — IRON AND TIBC
Iron: 50 ug/dL (ref 45–182)
Saturation Ratios: 17 % — ABNORMAL LOW (ref 17.9–39.5)
TIBC: 295 ug/dL (ref 250–450)
UIBC: 245 ug/dL

## 2021-08-03 LAB — BRAIN NATRIURETIC PEPTIDE: B Natriuretic Peptide: 63.5 pg/mL (ref 0.0–100.0)

## 2021-08-03 LAB — FERRITIN: Ferritin: 206 ng/mL (ref 24–336)

## 2021-08-03 LAB — FOLATE: Folate: 15.2 ng/mL (ref >5.9)

## 2021-08-03 LAB — APTT: aPTT: 28 seconds (ref 24–36)

## 2021-08-03 LAB — LACTIC ACID, PLASMA: Lactic Acid, Venous: 1.6 mmol/L (ref 0.5–1.9)

## 2021-08-03 LAB — VITAMIN B12: Vitamin B-12: 747 pg/mL (ref 180–914)

## 2021-08-03 MED ORDER — METOPROLOL TARTRATE 50 MG PO TABS
50.0000 mg | ORAL_TABLET | Freq: Two times a day (BID) | ORAL | Status: DC
  Administered 2021-08-03 – 2021-08-04 (×3): 50 mg via ORAL
  Filled 2021-08-03 (×4): qty 1

## 2021-08-03 MED ORDER — HYDRALAZINE HCL 20 MG/ML IJ SOLN: 5.0000 mg | INTRAMUSCULAR | Status: DC | PRN

## 2021-08-03 MED ORDER — SILDENAFIL CITRATE 20 MG PO TABS
20.0000 mg | ORAL_TABLET | Freq: Three times a day (TID) | ORAL | Status: DC
Start: 2021-08-03 — End: 2021-08-07
  Administered 2021-08-03 – 2021-08-07 (×11): 20 mg via ORAL
  Filled 2021-08-03 (×15): qty 1

## 2021-08-03 MED ORDER — ALLOPURINOL 100 MG PO TABS
200.0000 mg | ORAL_TABLET | Freq: Every day | ORAL | Status: DC
  Administered 2021-08-03 – 2021-08-07 (×5): 200 mg via ORAL
  Filled 2021-08-03 (×6): qty 2

## 2021-08-03 MED ORDER — INSULIN GLARGINE-YFGN 100 UNIT/ML ~~LOC~~ SOLN: 30.0000 [IU] | Freq: Every day | SUBCUTANEOUS | Status: DC

## 2021-08-03 MED ORDER — INSULIN ASPART 100 UNIT/ML IJ SOLN
0.0000 [IU] | Freq: Three times a day (TID) | INTRAMUSCULAR | Status: DC
  Administered 2021-08-03: 2 [IU] via SUBCUTANEOUS
  Administered 2021-08-03 – 2021-08-05 (×5): 1 [IU] via SUBCUTANEOUS
  Administered 2021-08-05 – 2021-08-06 (×2): 2 [IU] via SUBCUTANEOUS
  Administered 2021-08-06 (×2): 3 [IU] via SUBCUTANEOUS
  Administered 2021-08-07: 5 [IU] via SUBCUTANEOUS
  Administered 2021-08-07: 2 [IU] via SUBCUTANEOUS
  Filled 2021-08-03 (×12): qty 1

## 2021-08-03 MED ORDER — PANTOPRAZOLE 80MG IVPB - SIMPLE MED
80.0000 mg | Freq: Once | INTRAVENOUS | Status: AC
  Administered 2021-08-03: 80 mg via INTRAVENOUS
  Filled 2021-08-03: qty 80

## 2021-08-03 MED ORDER — FUROSEMIDE 40 MG PO TABS
40.0000 mg | ORAL_TABLET | Freq: Every day | ORAL | Status: DC
  Administered 2021-08-03 – 2021-08-04 (×2): 40 mg via ORAL
  Filled 2021-08-03 (×2): qty 1

## 2021-08-03 MED ORDER — ACETAMINOPHEN 325 MG PO TABS
650.0000 mg | ORAL_TABLET | Freq: Four times a day (QID) | ORAL | Status: DC | PRN
  Administered 2021-08-03 – 2021-08-04 (×3): 650 mg via ORAL
  Filled 2021-08-03 (×3): qty 2

## 2021-08-03 MED ORDER — VITAMIN B-12 1000 MCG PO TABS
1000.0000 ug | ORAL_TABLET | Freq: Every day | ORAL | Status: DC
  Administered 2021-08-03 – 2021-08-07 (×5): 1000 ug via ORAL
  Filled 2021-08-03 (×6): qty 1

## 2021-08-03 MED ORDER — INSULIN ASPART 100 UNIT/ML IJ SOLN
0.0000 [IU] | Freq: Every day | INTRAMUSCULAR | Status: DC
  Administered 2021-08-06: 2 [IU] via SUBCUTANEOUS
  Filled 2021-08-03: qty 1

## 2021-08-03 MED ORDER — PROTHROMBIN COMPLEX CONC HUMAN 500 UNITS IV KIT
4404.0000 [IU] | PACK | Status: AC
  Administered 2021-08-03: 4404 [IU] via INTRAVENOUS
  Filled 2021-08-03: qty 4404

## 2021-08-03 MED ORDER — FINASTERIDE 5 MG PO TABS
5.0000 mg | ORAL_TABLET | Freq: Every day | ORAL | Status: DC
  Administered 2021-08-03 – 2021-08-07 (×5): 5 mg via ORAL
  Filled 2021-08-03 (×6): qty 1

## 2021-08-03 MED ORDER — ONDANSETRON HCL 4 MG/2ML IJ SOLN
4.0000 mg | Freq: Once | INTRAMUSCULAR | Status: AC
  Administered 2021-08-03: 4 mg via INTRAVENOUS
  Filled 2021-08-03: qty 2

## 2021-08-03 MED ORDER — TREPROSTINIL 0.6 MG/ML IN SOLN
18.0000 ug | Freq: Four times a day (QID) | RESPIRATORY_TRACT | Status: DC
  Administered 2021-08-05 – 2021-08-07 (×8): 18 ug via RESPIRATORY_TRACT
  Filled 2021-08-03: qty 2.9

## 2021-08-03 MED ORDER — FLUTICASONE FUROATE-VILANTEROL 100-25 MCG/ACT IN AEPB
1.0000 | INHALATION_SPRAY | Freq: Every day | RESPIRATORY_TRACT | Status: DC
  Administered 2021-08-03 – 2021-08-07 (×5): 1 via RESPIRATORY_TRACT
  Filled 2021-08-03: qty 28

## 2021-08-03 MED ORDER — ATORVASTATIN CALCIUM 20 MG PO TABS
20.0000 mg | ORAL_TABLET | Freq: Every day | ORAL | Status: DC
  Administered 2021-08-03 – 2021-08-06 (×4): 20 mg via ORAL
  Filled 2021-08-03 (×4): qty 1

## 2021-08-03 MED ORDER — VITAMIN D 25 MCG (1000 UNIT) PO TABS
5000.0000 [IU] | ORAL_TABLET | Freq: Every day | ORAL | Status: DC
  Administered 2021-08-03 – 2021-08-07 (×5): 5000 [IU] via ORAL
  Filled 2021-08-03 (×6): qty 5

## 2021-08-03 MED ORDER — ALBUTEROL SULFATE (2.5 MG/3ML) 0.083% IN NEBU: 3.0000 mL | INHALATION_SOLUTION | RESPIRATORY_TRACT | Status: DC | PRN

## 2021-08-03 MED ORDER — ASCORBIC ACID 500 MG PO TABS
500.0000 mg | ORAL_TABLET | Freq: Every day | ORAL | Status: DC
  Administered 2021-08-03 – 2021-08-07 (×5): 500 mg via ORAL
  Filled 2021-08-03 (×5): qty 1

## 2021-08-03 MED ORDER — MAGNESIUM OXIDE -MG SUPPLEMENT 400 (240 MG) MG PO TABS
400.0000 mg | ORAL_TABLET | Freq: Three times a day (TID) | ORAL | Status: DC
  Administered 2021-08-03 – 2021-08-07 (×12): 400 mg via ORAL
  Filled 2021-08-03 (×13): qty 1

## 2021-08-03 MED ORDER — PANTOPRAZOLE SODIUM 40 MG IV SOLR
40.0000 mg | Freq: Two times a day (BID) | INTRAVENOUS | Status: DC
  Administered 2021-08-06 – 2021-08-07 (×2): 40 mg via INTRAVENOUS
  Filled 2021-08-03 (×2): qty 40

## 2021-08-03 MED ORDER — ONDANSETRON HCL 4 MG/2ML IJ SOLN
4.0000 mg | Freq: Three times a day (TID) | INTRAMUSCULAR | Status: DC | PRN
  Administered 2021-08-03: 4 mg via INTRAVENOUS
  Filled 2021-08-03: qty 2

## 2021-08-03 MED ORDER — SODIUM CHLORIDE 0.9 % IV SOLN
10.0000 mL/h | Freq: Once | INTRAVENOUS | Status: AC
  Administered 2021-08-03: 10 mL/h via INTRAVENOUS

## 2021-08-03 MED ORDER — MORPHINE SULFATE (PF) 4 MG/ML IV SOLN
4.0000 mg | Freq: Once | INTRAVENOUS | Status: AC
  Administered 2021-08-03: 4 mg via INTRAVENOUS
  Filled 2021-08-03: qty 1

## 2021-08-03 MED ORDER — MORPHINE SULFATE (PF) 2 MG/ML IV SOLN
1.0000 mg | INTRAVENOUS | Status: DC | PRN
  Administered 2021-08-03 – 2021-08-04 (×6): 1 mg via INTRAVENOUS
  Filled 2021-08-03 (×6): qty 1

## 2021-08-03 MED ORDER — INSULIN GLARGINE-YFGN 100 UNIT/ML ~~LOC~~ SOLN
15.0000 [IU] | Freq: Every day | SUBCUTANEOUS | Status: DC
  Administered 2021-08-03 – 2021-08-06 (×4): 15 [IU] via SUBCUTANEOUS
  Filled 2021-08-03 (×6): qty 0.15

## 2021-08-03 MED ORDER — PANTOPRAZOLE INFUSION (NEW) - SIMPLE MED
8.0000 mg/h | INTRAVENOUS | Status: AC
  Administered 2021-08-03 – 2021-08-04 (×2): 8 mg/h via INTRAVENOUS
  Filled 2021-08-03: qty 80
  Filled 2021-08-03 (×2): qty 100

## 2021-08-03 MED ORDER — DM-GUAIFENESIN ER 30-600 MG PO TB12: 1.0000 | ORAL_TABLET | Freq: Two times a day (BID) | ORAL | Status: DC | PRN

## 2021-08-03 NOTE — ED Notes (Signed)
Family updated as to patient's status by MD Ellender Hose. Isaacs at patients bedside with patients wife

## 2021-08-03 NOTE — ED Notes (Signed)
Hospitalist, [place], [place] regarding patient's episodes of oxygen desaturation. Also brought up pain control and possibly something PO for breakthrough pain. Will administer Tylenol and Morphine, reassess, then follow-up with Neomia Glass.

## 2021-08-03 NOTE — ED Triage Notes (Signed)
Pt brought from home via EMS. Pt states that on Sunday he was getting out of bed when he became very dizzy and lightheaded. Pt also vomited blood at the time. States he has not vomited since. Pt also states that his chronic lower back pain became more severe at that time. Pt is chronically on 2L of oxygen and states he is feeling short of breath.

## 2021-08-03 NOTE — H&P (Signed)
History and Physical    Jay Morris AES:[id number] DOB: 07/26/1945 DOA: 08/03/2021  Referring MD/NP/PA:   PCP: [name], MD   Patient coming from:  The patient is coming from home.  At baseline, pt is independent for most of ADL.        Chief Complaint: Dark stool, hematemesis  HPI: Jay Morris is a 76 y.o. male with medical history significant of hypertension, hyperlipidemia, diabetes mellitus, COPD on 2-3 L oxygen, GERD, gout, pulmonary hypertension, CHF, atrial fibrillation on Eliquis, BPH with chronic urinary retention (doing self cath twice a day),, CKD-4, anemia, CAD, GI bleeding, PVD, MGUS, who presents with dark stool and hematemesis.  Patient states that he has been having dark stool intermittently for about 2 weeks.  Yesterday morning, he had 1 episode of grossly bloody emesis. He has some central abdominal discomfort.  No diarrhea.  Patient stated he has generalized weakness, lightheadedness and dizziness.  Patient has chronic cough and short breath due to COPD which has not changed.  No chest pain, fever or chills.  Denies symptoms of UTI.  Patient states that he took last dose of Eliquis at Saturday night, 08/08/21.   ED Course: pt was found to have hemoglobin 9.9 on 07/15/2021 --> 6.3, WBC 12.7, negative COVID PCR, renal function close to baseline, temperature normal, blood pressure 126/58, heart rate 101, RR 25, oxygen saturation 94% on home 2-3 L oxygen.  Chest x-ray showed interstitial lung disease, without infiltration.  CT abdomen/pelvis is negative for acute issues, but showed constipation.  Patient is admitted to [place] bed as inpatient.  Dr. Haig Morris of GI is consulted   EKG: I have personally reviewed.  Sinus rhythm, QTC 450, occasional PVC.  Review of Systems:   General: no fevers, chills, no body weight gain, has fatigue HEENT: no blurry vision, hearing changes or sore throat Respiratory: has dyspnea, coughing, no wheezing CV: no chest pain, no  palpitations GI: Has hematemesis, dark stool, abdominal discomfort, constipation GU: no dysuria, burning on urination, increased urinary frequency, hematuria  Ext: no leg edema Neuro: no unilateral weakness, numbness, or tingling, no vision change or hearing loss.  Has dizziness and lightheadedness Skin: no rash, no skin tear. MSK: No muscle spasm, no deformity, no limitation of range of movement in spin Heme: No easy bruising.  Travel history: No recent long distant travel.   Allergy:  Allergies  Allergen Reactions   Shrimp [Shellfish Allergy] Anaphylaxis   Shrimp Extract Allergy Skin Test     Past Medical History:  Diagnosis Date   Asthma    Atrial fibrillation (HCC)    BPH (benign prostatic hyperplasia)    COPD (chronic obstructive pulmonary disease) (HCC)    Diabetes mellitus without complication (HCC)    Helicobacter pylori gastritis    Hyperlipidemia    Hypertension    Pneumonia    Right heart failure ([place])     Past Surgical History:  Procedure Laterality Date   BACK SURGERY     x3 L3-L4    CARDIAC ELECTROPHYSIOLOGY MAPPING AND ABLATION     CARDIOVERSION N/A 06/22/2019   Procedure: CARDIOVERSION;  Surgeon: Fay Records, MD;  Location: [place];  Service: Cardiovascular;  Laterality: N/A;   COLONOSCOPY WITH PROPOFOL N/A 06/15/2017   Procedure: COLONOSCOPY WITH PROPOFOL;  Surgeon: Toledo, Benay Pike, MD;  Location: [facility] ENDOSCOPY;  Service: Gastroenterology;  Laterality: N/A;   ENTEROSCOPY N/A 11/24/2017   Procedure: ENTEROSCOPY;  Surgeon: Jonathon Bellows, MD;  Location: [facility] ENDOSCOPY;  Service: Gastroenterology;  Laterality: N/A;   ESOPHAGOGASTRODUODENOSCOPY N/A 11/27/2017   Procedure: ESOPHAGOGASTRODUODENOSCOPY (EGD);  Surgeon: Lucilla Lame, MD;  Location: [facility] ENDOSCOPY;  Service: Endoscopy;  Laterality: N/A;   ESOPHAGOGASTRODUODENOSCOPY (EGD) WITH PROPOFOL N/A 06/02/2017   Procedure: ESOPHAGOGASTRODUODENOSCOPY (EGD) WITH PROPOFOL;  Surgeon: Toledo, Benay Pike, MD;   Location: [facility] ENDOSCOPY;  Service: Gastroenterology;  Laterality: N/A;   GIVENS CAPSULE STUDY N/A 11/25/2017   Procedure: GIVENS CAPSULE STUDY;  Surgeon: Jonathon Bellows, MD;  [facility];  Service: Gastroenterology;  Laterality: N/A;   HAND SURGERY     RIGHT/LEFT HEART CATH AND CORONARY ANGIOGRAPHY N/A 01/17/2020   Procedure: RIGHT/LEFT HEART CATH AND CORONARY ANGIOGRAPHY;  Surgeon: Corey Skains, MD;  Location: [place] [facility];  Service: Cardiovascular;  Laterality: N/A;    Social History:  reports that he quit smoking about 28 years ago. His smoking use included cigarettes. He has a 30.00 pack-year smoking history. He has never used smokeless tobacco. He reports that he does not drink alcohol and does not use drugs.  Family History:  Family History  Problem Relation Age of Onset   Heart disease Mother    Cancer Father    Cancer Paternal Grandfather      Prior to Admission medications   Medication Sig Start Date End Date Taking? Authorizing Provider  acetaminophen (TYLENOL) 500 MG tablet Take 1,000 mg by mouth every 6 (six) hours as needed for moderate pain or headache.   Yes [provider]  albuterol (VENTOLIN HFA) 108 (90 Base) MCG/ACT inhaler Inhale 2 puffs into the lungs daily.   Yes [provider]  allopurinol (ZYLOPRIM) 100 MG tablet Take 2 tablets by mouth daily. 06/25/21  Yes [provider]  colchicine 0.6 MG tablet Take 0.6 mg by mouth daily as needed (gout flare).   Yes [provider]  sodium chloride (OCEAN) 0.65 % SOLN nasal spray Place 1 spray into both nostrils as needed for congestion.   Yes [provider]  allopurinol (ZYLOPRIM) 100 MG tablet Take 100 mg by mouth daily. Patient not taking: Reported on 08/03/2021    [provider]  amLODipine (NORVASC) 5 MG tablet Take 5 mg by mouth daily. 07/29/21   [provider]  ascorbic acid (VITAMIN C) 500 MG tablet Take 500 mg by mouth daily.     [provider]  atorvastatin (LIPITOR) 20 MG tablet Take 20 mg by mouth at bedtime.     [provider]  BREO ELLIPTA 100-25 MCG/INH AEPB Inhale 1 puff into the lungs daily. 12/04/15   [provider]  Cholecalciferol (DIALYVITE VITAMIN D 5000) 125 MCG (5000 UT) capsule Take 5,000 Units by mouth daily.    [provider]  ELIQUIS 5 MG TABS tablet SMARTSIG:1 Tablet(s) By Mouth Every 12 Hours 01/21/20   [provider]  finasteride (PROSCAR) 5 MG tablet Take 1 tablet (5 mg total) by mouth daily. 07/30/20   Vaillancourt, Aldona Bar, PA-C  furosemide (LASIX) 80 MG tablet Take 40 mg by mouth daily.    [provider]  insulin aspart (NOVOLOG) 100 UNIT/ML FlexPen Inject 20-26 Units into the skin 3 (three) times daily with meals. 20 units am 26 units noon, 26 units evening 08/27/19   [provider]  insulin degludec (TRESIBA FLEXTOUCH) 200 UNIT/ML FlexTouch Pen Inject 46 Units into the skin at bedtime.    [provider]  lisinopril (ZESTRIL) 20 MG tablet Take 10 mg by mouth at bedtime.     [provider]  magnesium  oxide (MAG-OX) 400 MG tablet Take 1 tablet by mouth 3 (three) times daily. 07/23/20   [provider]  metoprolol tartrate (LOPRESSOR) 25 MG tablet Take 50 mg by mouth 2 (two) times daily.     [provider]  pantoprazole (PROTONIX) 40 MG tablet Take 1 tablet (40 mg total) by mouth 2 (two) times daily before a meal. 06/25/19   Aline August, MD  sildenafil (REVATIO) 20 MG tablet Take 20 mg by mouth 3 (three) times daily.    [provider]  TYVASO REFILL 0.6 MG/ML SOLN Inhale 0.6 mg into the lungs in the morning, at noon, in the evening, and at bedtime. 05/21/20   [provider]  vitamin B-12 (CYANOCOBALAMIN) 1000 MCG tablet Take 1,000 mcg by mouth daily.    [provider]    Physical Exam: Vitals:   08/03/21 1332 08/03/21 1345 08/03/21 1507 08/03/21 1532  BP: (!) 126/54   (!) 120/52 128/60  Pulse: 91 93 79 82  Resp: _0 Temp: 97.6 F (36.4 C)  97.9 F (36.6 C) 97.9 F (36.6 C)  TempSrc: Oral  Oral Oral  SpO2: 94% 94% 95% 95%  Weight:       General: Not in acute distress. Pale looking HEENT:       Eyes: PERRL, EOMI, no scleral icterus.       ENT: No discharge from the ears and nose, no pharynx injection, no tonsillar enlargement.        Neck: No JVD, no bruit, no mass felt. Heme: No neck lymph node enlargement. Cardiac: S1/S2, RRR, No murmurs, No gallops or rubs. Respiratory: No rales, wheezing, rhonchi or rubs. GI: Soft, nondistended, has mild tenderness in central abdomen, no rebound pain, no organomegaly, BS present. GU: No hematuria Ext: No pitting leg edema bilaterally. 1+DP/PT pulse bilaterally. Musculoskeletal: No joint deformities, No joint redness or warmth, no limitation of ROM in spin. Skin: No rashes.  Neuro: Alert, oriented X3, cranial nerves II-XII grossly intact, moves all extremities normally. Psych: Patient is not psychotic, no suicidal or hemocidal ideation.  Labs on Admission: I have personally reviewed following labs and imaging studies  CBC: Recent Labs  Lab 08/03/21 0642 08/03/21 1053  WBC 12.7* 15.2*  NEUTROABS 9.0*  --   HGB 6.3* 6.2*  HCT 19.4* 18.8*  MCV 99.5 99.5  PLT 293 382   Basic Metabolic Panel: Recent Labs  Lab 08/03/21 0642  NA 138  K 4.2  CL 106  CO2 23  GLUCOSE 153*  BUN 63*  CREATININE 2.28*  CALCIUM 8.8*   GFR: Estimated Creatinine Clearance: 26.3 mL/min (A) (by C-G formula based on SCr of 2.28 mg/dL (H)). Liver Function Tests: Recent Labs  Lab 08/03/21 0832  AST 12*  ALT 13  ALKPHOS 26*  BILITOT 0.5  PROT 6.1*  ALBUMIN 3.5   No results for input(s): LIPASE, AMYLASE in the last 168 hours. No results for input(s): AMMONIA in the last 168 hours. Coagulation Profile: Recent Labs  Lab 08/03/21 0832  INR 1.3*   Cardiac Enzymes: No results for input(s): CKTOTAL, CKMB,  CKMBINDEX, TROPONINI in the last 168 hours. BNP (last 3 results) No results for input(s): PROBNP in the last 8760 hours. HbA1C: No results for input(s): HGBA1C in the last 72 hours. CBG: Recent Labs  Lab 08/03/21 1133 08/03/21 1630  GLUCAP 176* 135*   Lipid Profile: No results for input(s): CHOL, HDL, LDLCALC, TRIG, CHOLHDL, LDLDIRECT in the last 72 hours. Thyroid Function Tests:  No results for input(s): TSH, T4TOTAL, FREET4, T3FREE, THYROIDAB in the last 72 hours. Anemia Panel: Recent Labs    08/03/21 0831 08/03/21 0832  VITAMINB12 747  --   FOLATE  --  15.2  FERRITIN  --  206  TIBC  --  295  IRON  --  50  RETICCTPCT  --  2.5   Urine analysis:    Component Value Date/Time   COLORURINE STRAW (A) 08/03/2021 1239   APPEARANCEUR CLEAR (A) 08/03/2021 1239   LABSPEC 1.014 08/03/2021 1239   PHURINE 5.0 08/03/2021 1239   GLUCOSEU NEGATIVE 08/03/2021 1239   HGBUR NEGATIVE 08/03/2021 1239   BILIRUBINUR NEGATIVE 08/03/2021 1239   KETONESUR NEGATIVE 08/03/2021 1239   PROTEINUR NEGATIVE 08/03/2021 1239   NITRITE NEGATIVE 08/03/2021 1239   LEUKOCYTESUR NEGATIVE 08/03/2021 1239   Sepsis Labs: _0 (procalcitonin:4,lacticidven:4) ) Recent Results (from the past 240 hour(s))  Resp Panel by RT-PCR (Flu A&B, Covid) Nasopharyngeal Swab     Status: None   Collection Time: 08/03/21  6:42 AM   Specimen: Nasopharyngeal Swab; Nasopharyngeal(NP) swabs in vial transport medium  Result Value Ref Range Status   SARS Coronavirus 2 by RT PCR NEGATIVE NEGATIVE Final    Comment: (NOTE) SARS-CoV-2 target nucleic acids are NOT DETECTED.  The SARS-CoV-2 RNA is generally detectable in upper respiratory specimens during the acute phase of infection. The lowest concentration of SARS-CoV-2 viral copies this assay can detect is 138 copies/mL. A negative result does not preclude SARS-Cov-2 infection and should not be used as the sole basis for treatment or other patient management decisions.  A negative result may occur with  improper specimen collection/handling, submission of specimen other than nasopharyngeal swab, presence of viral mutation(s) within the areas targeted by this assay, and inadequate number of viral copies(<138 copies/mL). A negative result must be combined with clinical observations, patient history, and epidemiological information. The expected result is Negative.  Fact Sheet for Patients:  EntrepreneurPulse.com.au  Fact Sheet for Healthcare Providers:  IncredibleEmployment.be  This test is no t yet approved or cleared by the [place] FDA and  has been authorized for detection and/or diagnosis of SARS-CoV-2 by FDA under an Emergency Use Authorization (EUA). This EUA will remain  in effect (meaning this test can be used) for the duration of the COVID-19 declaration under Section 564(b)(1) of the Act, 21 U.S.C.section 360bbb-3(b)(1), unless the authorization is terminated  or revoked sooner.       Influenza A by PCR NEGATIVE NEGATIVE Final   Influenza B by PCR NEGATIVE NEGATIVE Final    Comment: (NOTE) The Xpert Xpress SARS-CoV-2/FLU/RSV plus assay is intended as an aid in the diagnosis of influenza from Nasopharyngeal swab specimens and should not be used as a sole basis for treatment. Nasal washings and aspirates are unacceptable for Xpert Xpress SARS-CoV-2/FLU/RSV testing.  Fact Sheet for Patients: EntrepreneurPulse.com.au  Fact Sheet for Healthcare Providers: IncredibleEmployment.be  This test is not yet approved or cleared by the [place] FDA and has been authorized for detection and/or diagnosis of SARS-CoV-2 by FDA under an Emergency Use Authorization (EUA). This EUA will remain in effect (meaning this test can be used) for the duration of the COVID-19 declaration under Section 564(b)(1) of the Act, 21 U.S.C. section 360bbb-3(b)(1), unless the authorization  is terminated or revoked.  Performed at [facility], [address], [place], [place] [address]      Radiological Exams on Admission: CT ABDOMEN PELVIS WO CONTRAST  Result Date: 08/03/2021 CLINICAL DATA:  Acute non  localized abdominal pain. Yesterday dizziness and lightheadedness. Vomited blood. EXAM: CT ABDOMEN AND PELVIS WITHOUT CONTRAST TECHNIQUE: Multidetector CT imaging of the abdomen and pelvis was performed following the standard protocol without IV contrast. RADIATION DOSE REDUCTION: This exam was performed according to the departmental dose-optimization program which includes automated exposure control, adjustment of the mA and/or kV according to patient size and/or use of iterative reconstruction technique. COMPARISON:  Renal ultrasound 01/29/2021, CT abdomen and pelvis without contrast 06/17/2019 FINDINGS: Lower chest: There is again interlobular septal thickening within the lung bases. Interval mildly improved aeration with persistent predominantly peripheral scarring, raising the question of chronic interstitial lung disease. Heart size is again mildly enlarged. Trace pericardial fluid. Dense coronary artery calcifications. Aortic root calcification. Lack of intra-articular fluid limits evaluation of the abdominal and pelvic organ parenchyma. The following findings are made within this limitation. Hepatobiliary: A subcentimeter low-density focus within the right hepatic lobe seen on 06/17/2019 noncontrast CT is less well visualized and appears decreased in size (axial image 18). This again suggests a benign process. The gallbladder is grossly unremarkable. Pancreas: Unremarkable. No pancreatic ductal dilatation or surrounding inflammatory changes. Spleen: Normal in size without focal abnormality. Adrenals/Urinary Tract: Normal adrenals. Stable chronic bilateral perinephric fat stranding. No renal stone or hydronephrosis. Within the limitations of lack of IV contrast, no contour  deforming renal mass. The urinary bladder is grossly unremarkable. Stomach/Bowel: Moderate to high-grade stool throughout all segments of the colon compatible with constipation. No bowel wall thickening. The terminal ileum is unremarkable. The appendix is within normal limits. No dilated loops of bowel to indicate bowel obstruction. Vascular/Lymphatic: No abdominal aortic aneurysm. High-grade atherosclerotic calcifications are similar to prior. No enlarged abdominal or pelvic lymph nodes. Reproductive: Prostate is unremarkable. Other: No abdominal wall hernia. No free air or free fluid. Musculoskeletal: Mild levocurvature of the lower lumbar spine. Moderate to severe multilevel degenerative disc changes with large anterior L2-3 through L4-5 endplate osteophytes. IMPRESSION:: IMPRESSION: 1. Moderate to high-grade stool throughout all segments of the colon compatible with constipation. 2. No acute abnormality is seen within the abdomen or pelvis. 3. Likely moderate chronic scarring within the lung bases. 4. Mild cardiomegaly.  Aortic Atherosclerosis (ICD10-I70.0). Electronically Signed   By: Yvonne Kendall M.D.   On: 08/03/2021 09:22   DG Chest 2 View  Result Date: 08/03/2021 CLINICAL DATA:  Shortness of breath and weakness. EXAM: CHEST - 2 VIEW COMPARISON:  10/03/2019 FINDINGS: Stable cardiomediastinal contours. Lung volumes are low. Diffuse coarsened interstitial markings are identified bilaterally reflecting a combination of COPD/emphysema and pulmonary fibrosis. No superimposed airspace consolidation. IMPRESSION: 1. Advanced chronic interstitial lung disease without acute superimposed findings. Electronically Signed   By: Kerby Moors M.D.   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  Possibly due to upper GI bleeding. Pt is taking Eliquis, last dose was on Saturday night.  Dr. Haig Morris of GI is consulted, possible EGD morning  -  will admitted to med-surg bed as inpatient -Hold Eliquis -Give 1 dose of Kcentra to reverse Eliquis - transfuse 2 units of blood now - IVF: 1L NS bolus, then at 125 mL/hr - Start IV pantoprazole gtt - Zofran IV for nausea - Avoid NSAIDs and SQ heparin - Maintain IV access (2 large bore IVs if possible). 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  Patient taking NovoLog and Tresiba 23 units daily -Sliding scale insulin -Glargine insulin 15 unit daily  Benign prostatic hyperplasia -Proscar  Gout -Allopurinol  Chronic diastolic CHF (congestive heart failure) ([place]): 2D echo 05/31/2019 showed EF of 60 to 65%.  Patient does not have leg edema or JVD.  BNP 151. CHF seem to be compensated. -Continue home Lasix  HTN (hypertension) -Hold lisinopril, amlodipine since patient is at high risk of developing hypotension due to severe anemia -Patient is on IV Lasix for CHF -Continue metoprolol -IV hydralazine as needed  Atrial fibrillation, chronic ([place]): Heart rate 101 -Continue metoprolol -Hold Eliquis  CKD  (chronic kidney disease), stage IV ([place]): Renal function close to baseline.  Recent baseline creatinine 2.1-2.5.  His creatinine is at 2.28, BUN 63 -Follow-up with BMP  CAD (coronary artery disease) -Continue Lipitor  Leukocytosis: WBC 12.7, no source of infection identified. No fever.  Likely reactive. -Follow-up urinalysis --> negative         DVT ppx: SCD Code Status: Full code Family Communication:  Yes, patient's wife and daughter  at bed side.    Disposition Plan:  Anticipate discharge back to previous environment Consults called: Dr. Haig Morris for GI Admission status and Level of care: Telemetry Medical:    as inpt        Status is: Inpatient  Remains inpatient appropriate because: Patient has multiple comorbidities, now presents with possible upper GI bleeding.  Hemoglobin dropped by 3.6 g.  His presentation is highly complicated.  Patient is at high risk of deteriorating given multiple severe chronic medical problems.  Will need to be treated in hospital for 32 days.          Date of Service 08/03/2021    [facility]   If 7PM-7AM, please contact night-coverage [email or web address] 08/03/2021, 4:34 PM

## 2021-08-03 NOTE — ED Provider Notes (Addendum)
[facility] Provider Note    Event Date/Time   First MD Initiated Contact with Patient 08/03/21 [id number]     (approximate)   History   Back Pain, Shortness of Breath, Weakness, and Near Syncope   HPI  Jay Morris is a 76 y.o. male with history of A. fib RVR on anticoagulation, CHF, diabetes, reported history of GI bleed, here with generalized weakness.  Patient states that yesterday morning, he woke up and felt abdominal discomfort.  He began vomiting and had 1-2 episodes of grossly bloody emesis.  Since then, he has felt weak and had worsening abdominal and back pain.  This pain is increasingly severe.  He has been weak and short of breath and had difficulty getting around the house today, which is why he called EMS.  He is on a blood thinner has been taking this prescribed.  He has a history of anemia and has been transfused previously.  No history of complicated reactions to transfusion.  He denies known history of ulcers.  He does note that he has had black, tarry stools that have worsened over the last several weeks.  Denies any NSAID use.  He currently feels weak and his primary complaint is aching, throbbing, lower back pain.  This is worse with any kind of movement.  It radiates to his abdomen as well.     Physical Exam   Triage Vital Signs: ED Triage Vitals  Enc Vitals Group     BP 08/03/21 0632 (!) 111/54     Pulse Rate 08/03/21 0632 (!) 114     Resp 08/03/21 0632 (!) 25     Temp 08/03/21 0632 97.7 F (36.5 C)     Temp Source 08/03/21 0632 Oral     SpO2 08/03/21 0632 95 %     Weight 08/03/21 0637 179 lb 14.3 oz (81.6 kg)     Height --      Head Circumference --      Peak Flow --      Pain Score 08/03/21 0637 8     Pain Loc --      Pain Edu? --      Excl. in [place]? --     Most recent vital signs: Vitals:   08/03/21 1130 08/03/21 1136  BP: (!) 143/56 (!) 149/56  Pulse: (!) 107 (!) 118  Resp: 18 20  Temp:  97.8 F (36.6 C)  SpO2: 99% 93%      General: Awake, appears pale, in mild distress due to pain. CV:  Good peripheral perfusion.  Slight tachycardia.  No murmurs. Resp:  Normal effort.  Clear breath sounds. Abd:  No distention.  Moderate diffuse tenderness, worse in the epigastric area.  Slight guarding.  No rebound. Other:  Slightly dry mucous membranes.  Ears slightly uncomfortable due to pain.   ED Results / Procedures / Treatments   Labs (all labs ordered are listed, but only abnormal results are displayed) Labs Reviewed  BASIC METABOLIC PANEL - Abnormal; Notable for the following components:      Result Value   Glucose, Bld 153 (*)    BUN 63 (*)    Creatinine, Ser 2.28 (*)    Calcium 8.8 (*)    GFR, Estimated 29 (*)    All other components within normal limits  CBC WITH DIFFERENTIAL/PLATELET - Abnormal; Notable for the following components:   WBC 12.7 (*)    RBC 1.95 (*)    Hemoglobin 6.3 (*)  HCT 19.4 (*)    RDW 16.2 (*)    Neutro Abs 9.0 (*)    All other components within normal limits  IRON AND TIBC - Abnormal; Notable for the following components:   Saturation Ratios 17 (*)    All other components within normal limits  RETICULOCYTES - Abnormal; Notable for the following components:   RBC. 1.93 (*)    Immature Retic Fract 29.5 (*)    All other components within normal limits  HEPATIC FUNCTION PANEL - Abnormal; Notable for the following components:   Total Protein 6.1 (*)    AST 12 (*)    Alkaline Phosphatase 26 (*)    All other components within normal limits  PROTIME-INR - Abnormal; Notable for the following components:   Prothrombin Time 15.9 (*)    INR 1.3 (*)    All other components within normal limits  CBC - Abnormal; Notable for the following components:   WBC 15.2 (*)    RBC 1.89 (*)    Hemoglobin 6.2 (*)    HCT 18.8 (*)    RDW 16.2 (*)    All other components within normal limits  CBG MONITORING, ED - Abnormal; Notable for the following components:   Glucose-Capillary 176  (*)    All other components within normal limits  RESP PANEL BY RT-PCR (FLU A&B, COVID) ARPGX2  CULTURE, BLOOD (ROUTINE X 2)  CULTURE, BLOOD (ROUTINE X 2)  FOLATE  FERRITIN  LACTIC ACID, PLASMA  BRAIN NATRIURETIC PEPTIDE  APTT  VITAMIN B12  CBC  CBC  URINALYSIS, COMPLETE (UACMP) WITH MICROSCOPIC  TYPE AND SCREEN  PREPARE RBC (CROSSMATCH)  TROPONIN I (HIGH SENSITIVITY)  TROPONIN I (HIGH SENSITIVITY)     EKG  Sinus tachycardia with occasional PVCs.  Ventricular rate 108, PR 126, QRS 74, QTc 450.  No acute ST elevations or depressions.  Acute evidence of acute ischemia or infarct.  Nonspecific ST changes, possibly demand related.   RADIOLOGY Chest x-ray: Reviewed by me, chronic interstitial lung disease without acute findings    PROCEDURES:  Critical Care performed: Yes, see critical care procedure note(s)  .Critical Care Performed by: Duffy Bruce, MD Authorized by: Duffy Bruce, MD   Critical care provider statement:    Critical care time (minutes):  30   Critical care time was exclusive of:  Separately billable procedures and treating other patients   Critical care was necessary to treat or prevent imminent or life-threatening deterioration of the following conditions:  Cardiac failure, circulatory failure and respiratory failure   Critical care was time spent personally by me on the following activities:  Development of treatment plan with patient or surrogate, discussions with consultants, evaluation of patient's response to treatment, examination of patient, ordering and review of laboratory studies, ordering and review of radiographic studies, ordering and performing treatments and interventions, pulse oximetry, re-evaluation of patient's condition and review of old charts .1-3 Lead EKG Interpretation Performed by: Duffy Bruce, MD Authorized by: Duffy Bruce, MD     Interpretation: abnormal     ECG rate:  100-110   ECG rate assessment: tachycardic      Rhythm: sinus tachycardia     Ectopy: none     Conduction: normal   Comments:     Indication: Weakness    MEDICATIONS ORDERED IN ED: Medications  pantoprozole (PROTONIX) 80 mg /NS 100 mL infusion (8 mg/hr Intravenous New Bag/Given 08/03/21 0929)  pantoprazole (PROTONIX) injection 40 mg (has no administration in time range)  0.9 %  sodium  chloride infusion (has no administration in time range)  prothrombin complex conc human (KCENTRA) IVPB 4,404 Units (4,404 Units Intravenous New Bag/Given 08/03/21 1218)  albuterol (PROVENTIL) (2.5 MG/3ML) 0.083% nebulizer solution 3 mL (has no administration in time range)  dextromethorphan-guaiFENesin (MUCINEX DM) 30-600 MG per 12 hr tablet 1 tablet (has no administration in time range)  ondansetron (ZOFRAN) injection 4 mg (has no administration in time range)  acetaminophen (TYLENOL) tablet 650 mg (has no administration in time range)  hydrALAZINE (APRESOLINE) injection 5 mg (has no administration in time range)  insulin aspart (novoLOG) injection 0-9 Units (2 Units Subcutaneous Given 08/03/21 1223)  insulin aspart (novoLOG) injection 0-5 Units (has no administration in time range)  allopurinol (ZYLOPRIM) tablet 200 mg (200 mg Oral Given 08/03/21 1226)  atorvastatin (LIPITOR) tablet 20 mg (has no administration in time range)  metoprolol tartrate (LOPRESSOR) tablet 50 mg (50 mg Oral Given 08/03/21 1138)  sildenafil (REVATIO) tablet 20 mg (has no administration in time range)  magnesium oxide (MAG-OX) tablet 400 mg (400 mg Oral Given 08/03/21 1138)  finasteride (PROSCAR) tablet 5 mg (5 mg Oral Given 08/03/21 1226)  vitamin B-12 (CYANOCOBALAMIN) tablet 1,000 mcg (1,000 mcg Oral Given 08/03/21 1225)  ascorbic acid (VITAMIN C) tablet 500 mg (500 mg Oral Given 08/03/21 1137)  cholecalciferol (VITAMIN D3) tablet 5,000 Units (5,000 Units Oral Given 08/03/21 1135)  fluticasone furoate-vilanterol (BREO ELLIPTA) 100-25 MCG/ACT 1 puff (has no administration in time  range)  insulin glargine-yfgn (SEMGLEE) injection 15 Units (has no administration in time range)  morphine 2 MG/ML injection 1 mg (1 mg Intravenous Given 08/03/21 1223)  pantoprazole (PROTONIX) 80 mg /NS 100 mL IVPB (0 mg Intravenous Stopped 08/03/21 1051)  morphine 4 MG/ML injection 4 mg (4 mg Intravenous Given 08/03/21 0927)  ondansetron (ZOFRAN) injection 4 mg (4 mg Intravenous Given 08/03/21 0925)     IMPRESSION / MDM / ASSESSMENT AND PLAN / ED COURSE  I reviewed the triage vital signs and the nursing notes.                               The patient is on the cardiac monitor to evaluate for evidence of arrhythmia and/or significant heart rate changes.   Ddx: Peptic ulcer disease with active bleed, gastritis, AVM, esophagitis, perforated ulcer with subsequent back pain, retroperitoneal hematoma, AAA with fistula  76 year old male with extensive past medical history including A. fib on Eliquis, diabetes, history of prior GI bleed of unknown source here with back pain, weakness, and fatigue.  Lab work reviewed, shows significant acute on chronic anemia with hemoglobin of 6.3.  Given his blood thinner use and history of cardiac disease, he is at high risk of decompensation so will be transfused emergent PRBCs.  Patient has required transfusions in the past.  Patient also with slight elevation in his baseline creatinine but disproportionate increase in BUN concerning for upper GI bleed.  Lactic acid is normal.  Given his back pain, he was sent for stat CT abdomen/pelvis, which was reviewed by me and shows no evidence of perforation or other acute complication.  Does have moderate stool burden.  Chest x-ray is unremarkable.  EKG shows likely nonspecific demand related changes.  Plan to discuss with GI, admit to hospitalist service.  Patient started on PPI bolus and drip and given analgesia.   MEDICATIONS GIVEN IN ED: Medications  pantoprozole (PROTONIX) 80 mg /NS 100 mL infusion (8 mg/hr  Intravenous New  Bag/Given 08/03/21 0929)  pantoprazole (PROTONIX) injection 40 mg (has no administration in time range)  0.9 %  sodium chloride infusion (has no administration in time range)  prothrombin complex conc human (KCENTRA) IVPB 4,404 Units (4,404 Units Intravenous New Bag/Given 08/03/21 1218)  albuterol (PROVENTIL) (2.5 MG/3ML) 0.083% nebulizer solution 3 mL (has no administration in time range)  dextromethorphan-guaiFENesin (MUCINEX DM) 30-600 MG per 12 hr tablet 1 tablet (has no administration in time range)  ondansetron (ZOFRAN) injection 4 mg (has no administration in time range)  acetaminophen (TYLENOL) tablet 650 mg (has no administration in time range)  hydrALAZINE (APRESOLINE) injection 5 mg (has no administration in time range)  insulin aspart (novoLOG) injection 0-9 Units (2 Units Subcutaneous Given 08/03/21 1223)  insulin aspart (novoLOG) injection 0-5 Units (has no administration in time range)  allopurinol (ZYLOPRIM) tablet 200 mg (200 mg Oral Given 08/03/21 1226)  atorvastatin (LIPITOR) tablet 20 mg (has no administration in time range)  metoprolol tartrate (LOPRESSOR) tablet 50 mg (50 mg Oral Given 08/03/21 1138)  sildenafil (REVATIO) tablet 20 mg (has no administration in time range)  magnesium oxide (MAG-OX) tablet 400 mg (400 mg Oral Given 08/03/21 1138)  finasteride (PROSCAR) tablet 5 mg (5 mg Oral Given 08/03/21 1226)  vitamin B-12 (CYANOCOBALAMIN) tablet 1,000 mcg (1,000 mcg Oral Given 08/03/21 1225)  ascorbic acid (VITAMIN C) tablet 500 mg (500 mg Oral Given 08/03/21 1137)  cholecalciferol (VITAMIN D3) tablet 5,000 Units (5,000 Units Oral Given 08/03/21 1135)  fluticasone furoate-vilanterol (BREO ELLIPTA) 100-25 MCG/ACT 1 puff (has no administration in time range)  insulin glargine-yfgn (SEMGLEE) injection 15 Units (has no administration in time range)  morphine 2 MG/ML injection 1 mg (1 mg Intravenous Given 08/03/21 1223)  pantoprazole (PROTONIX) 80 mg /NS 100 mL IVPB (0  mg Intravenous Stopped 08/03/21 1051)  morphine 4 MG/ML injection 4 mg (4 mg Intravenous Given 08/03/21 0927)  ondansetron (ZOFRAN) injection 4 mg (4 mg Intravenous Given 08/03/21 0925)      Consults: GI Dr. Haig Prophet, Hospitalist Dr. Blaine Hamper both consulted, case discussed, and admitted.   EMR reviewed   Hospitalization from 2019 with GIB, unknown source but required 4u pRBC, Cardiology notes with AFib     FINAL CLINICAL IMPRESSION(S) / ED DIAGNOSES   Final diagnoses:  Blood loss anemia  Upper GI bleed  Anticoagulated     Rx / DC Orders   ED Discharge Orders     None        Note:  This document was prepared using Dragon voice recognition software and may include unintentional dictation errors.   Duffy Bruce, MD 08/03/21 1226    Duffy Bruce, MD 08/03/21 1227

## 2021-08-03 NOTE — ED Notes (Signed)
ED Provider at bedside. °

## 2021-08-03 NOTE — ED Notes (Signed)
Report received from [place], [place]

## 2021-08-03 NOTE — Consult Note (Signed)
Consultation  Referring Provider:     Dr Blaine Hamper Admit date  08/03/21 Consult date        08/03/21 Reason for Consultation:     melena/hematemesis         HPI:   Jay Morris is a 76 y.o. male with medical history significant of hypertension, hyperlipidemia, diabetes mellitus, COPD on 2-3 L oxygen, GERD, gout, pulmonary hypertension, CHF, atrial fibrillation on Eliquis, BPH with chronic urinary retention (doing self cath twice a day),, CKD-4, anemia, CAD, GI bleeding, PVD, iron and b12 deficiency, he was admitted for hematemesis/melena, anemia exacerbation- admission hemoglobin 6.3, down approx 3.5 g from 2w ago. He was admitted for GI blood loss in 2019 for which evaluation did not reveal a source. Patient reports he was having black stools last week- last was Thursday. Sunday woke up not feeling well. Developed some mid abdominal pain and vomited some red material. At that time the abdominal pain worsened and he had bad lower back pain, felt pre-syncopal. He now notes some lower abdominal pain at times.  He has not further GI complaints. Says this does not feel like his 2019 GIB episode.  Denies NSAIDS. He has no history of liver disease. Says he is taking the pantoprazole twice daily but is not taking this for max benefit. He has received the first of 2 U prbcs. He is on a pantoprazole drip. Not feeling dizzzy or presyncopal at this time. Has some chronic sob- feels this is stable. Rarely ever has any chest pain and not having any now. He has no history of AAA. Last eliquis was Saturday night. CT a/P today- 1. Moderate to high-grade stool throughout all segments of the colon compatible with constipation. 2. No acute abnormality is seen within the abdomen or pelvis. 3. Likely moderate chronic scarring within the lung bases. 4. Mild cardiomegaly.  Aortic Atherosclerosis (ICD10-I70.0).  PREVIOUS ENDOSCOPIES:             VCE 11/28/17- Dr Vicente Males- normal per chart note 02/13/18 EGD 11/27/17- Dr Allen Norris-  Small hiatal hernia gastritis. Normal duodenum. It was noted the a video capsule enteroscopy was successfully placed in the 2nd portion of duodenum Small bowel enteroscopy 11/24/17- Dr Anna/symptomatic anemia normal esophagus, stomach, duodenum, examined jejunum. Colonoscopy 06/15/17- Dr Alice Reichert- small transverse colon hyperplastic polyp, normal colonic mucosa. Negative random biopsy EGD 06/02/17- Dr Alice Reichert- normal esophagus, moderate gastritis, normal duodenum. There was no h pylori, dysplasia, malignancy Negative celiac panel 2019  Past Medical History:  Diagnosis Date   Asthma    Atrial fibrillation (HCC)    BPH (benign prostatic hyperplasia)    COPD (chronic obstructive pulmonary disease) (HCC)    Diabetes mellitus without complication (HCC)    Helicobacter pylori gastritis    Hyperlipidemia    Hypertension    Pneumonia    Right heart failure ([place])     Past Surgical History:  Procedure Laterality Date   BACK SURGERY     x3 L3-L4    CARDIAC ELECTROPHYSIOLOGY MAPPING AND ABLATION     CARDIOVERSION N/A 06/22/2019   Procedure: CARDIOVERSION;  Surgeon: Fay Records, MD;  Location: [place];  Service: Cardiovascular;  Laterality: N/A;   COLONOSCOPY WITH PROPOFOL N/A 06/15/2017   Procedure: COLONOSCOPY WITH PROPOFOL;  Surgeon: Toledo, Benay Pike, MD;  Location: [facility] ENDOSCOPY;  Service: Gastroenterology;  Laterality: N/A;   ENTEROSCOPY N/A 11/24/2017   Procedure: ENTEROSCOPY;  Surgeon: Jonathon Bellows, MD;  Location: [facility] ENDOSCOPY;  Service: Gastroenterology;  Laterality: N/A;   ESOPHAGOGASTRODUODENOSCOPY N/A  11/27/2017   Procedure: ESOPHAGOGASTRODUODENOSCOPY (EGD);  Surgeon: Lucilla Lame, MD;  Location: [facility] ENDOSCOPY;  Service: Endoscopy;  Laterality: N/A;   ESOPHAGOGASTRODUODENOSCOPY (EGD) WITH PROPOFOL N/A 06/02/2017   Procedure: ESOPHAGOGASTRODUODENOSCOPY (EGD) WITH PROPOFOL;  Surgeon: Toledo, Benay Pike, MD;  Location: [facility] ENDOSCOPY;  Service: Gastroenterology;  Laterality: N/A;    GIVENS CAPSULE STUDY N/A 11/25/2017   Procedure: GIVENS CAPSULE STUDY;  Surgeon: Jonathon Bellows, MD;  Location: [facility] ENDOSCOPY;  Service: Gastroenterology;  Laterality: N/A;   HAND SURGERY     RIGHT/LEFT HEART CATH AND CORONARY ANGIOGRAPHY N/A 01/17/2020   Procedure: RIGHT/LEFT HEART CATH AND CORONARY ANGIOGRAPHY;  Surgeon: Corey Skains, MD;  Location: [place] CV LAB;  Service: Cardiovascular;  Laterality: N/A;    Family History  Problem Relation Age of Onset   Heart disease Mother    Cancer Father    Cancer Paternal Grandfather      Social History   Tobacco Use   Smoking status: Former    Packs/day: 1.00    Years: 30.00    Pack years: 30.00    Types: Cigarettes    Quit date: 1995    Years since quitting: 28.0   Smokeless tobacco: Never  Vaping Use   Vaping Use: Never used  Substance Use Topics   Alcohol use: No    Alcohol/week: 0.0 standard drinks    Comment: occasional   Drug use: No    Prior to Admission medications   Medication Sig Start Date End Date Taking? Authorizing Provider  acetaminophen (TYLENOL) 500 MG tablet Take 1,000 mg by mouth every 6 (six) hours as needed for moderate pain or headache.   Yes [provider]  albuterol (VENTOLIN HFA) 108 (90 Base) MCG/ACT inhaler Inhale 2 puffs into the lungs daily.   Yes [provider]  allopurinol (ZYLOPRIM) 100 MG tablet Take 2 tablets by mouth daily. 06/25/21  Yes [provider]  colchicine 0.6 MG tablet Take 0.6 mg by mouth daily as needed (gout flare).   Yes [provider]  sodium chloride (OCEAN) 0.65 % SOLN nasal spray Place 1 spray into both nostrils as needed for congestion.   Yes [provider]  allopurinol (ZYLOPRIM) 100 MG tablet Take 100 mg by mouth daily. Patient not taking: Reported on 08/03/2021    [provider]  amLODipine (NORVASC) 5 MG tablet Take 5 mg by mouth daily. 07/29/21   [provider]  ascorbic acid (VITAMIN C) 500 MG  tablet Take 500 mg by mouth daily.    [provider]  atorvastatin (LIPITOR) 20 MG tablet Take 20 mg by mouth at bedtime.     [provider]  BREO ELLIPTA 100-25 MCG/INH AEPB Inhale 1 puff into the lungs daily. 12/04/15   [provider]  Cholecalciferol (DIALYVITE VITAMIN D 5000) 125 MCG (5000 UT) capsule Take 5,000 Units by mouth daily.    [provider]  ELIQUIS 5 MG TABS tablet SMARTSIG:1 Tablet(s) By Mouth Every 12 Hours 01/21/20   [provider]  finasteride (PROSCAR) 5 MG tablet Take 1 tablet (5 mg total) by mouth daily. 07/30/20   Vaillancourt, Aldona Bar, PA-C  furosemide (LASIX) 80 MG tablet Take 40 mg by mouth daily.    [provider]  insulin aspart (NOVOLOG) 100 UNIT/ML FlexPen Inject 20-26 Units into the skin 3 (three) times daily with meals. 20 units am 26 units noon, 26 units evening 08/27/19   [provider]  insulin degludec (TRESIBA FLEXTOUCH) 200 UNIT/ML FlexTouch Pen Inject  46 Units into the skin at bedtime.    [provider]  lisinopril (ZESTRIL) 20 MG tablet Take 10 mg by mouth at bedtime.     [provider]  magnesium oxide (MAG-OX) 400 MG tablet Take 1 tablet by mouth 3 (three) times daily. 07/23/20   [provider]  metoprolol tartrate (LOPRESSOR) 25 MG tablet Take 50 mg by mouth 2 (two) times daily.     [provider]  pantoprazole (PROTONIX) 40 MG tablet Take 1 tablet (40 mg total) by mouth 2 (two) times daily before a meal. 06/25/19   Aline August, MD  sildenafil (REVATIO) 20 MG tablet Take 20 mg by mouth 3 (three) times daily.    [provider]  TYVASO REFILL 0.6 MG/ML SOLN Inhale 0.6 mg into the lungs in the morning, at noon, in the evening, and at bedtime. 05/21/20   [provider]  vitamin B-12 (CYANOCOBALAMIN) 1000 MCG tablet Take 1,000 mcg by mouth daily.    [provider]    Current Facility-Administered Medications  Medication Dose  Route Frequency Provider Last Rate Last Admin   0.9 %  sodium chloride infusion  10 mL/hr Intravenous Once Ivor Costa, MD       acetaminophen (TYLENOL) tablet 650 mg  650 mg Oral Q6H PRN Ivor Costa, MD       albuterol (PROVENTIL) (2.5 MG/3ML) 0.083% nebulizer solution 3 mL  3 mL Inhalation Q4H PRN Ivor Costa, MD       allopurinol (ZYLOPRIM) tablet 200 mg  200 mg Oral Daily Ivor Costa, MD       ascorbic acid (VITAMIN C) tablet 500 mg  500 mg Oral Daily Ivor Costa, MD   500 mg at 08/03/21 1137   atorvastatin (LIPITOR) tablet 20 mg  20 mg Oral QHS Ivor Costa, MD       cholecalciferol (VITAMIN D3) tablet 5,000 Units  5,000 Units Oral Daily Ivor Costa, MD   5,000 Units at 08/03/21 1135   dextromethorphan-guaiFENesin ([place] DM) 30-600 MG per 12 hr tablet 1 tablet  1 tablet Oral BID PRN Ivor Costa, MD       finasteride (PROSCAR) tablet 5 mg  5 mg Oral Daily Ivor Costa, MD       fluticasone furoate-vilanterol (BREO ELLIPTA) 100-25 MCG/ACT 1 puff  1 puff Inhalation Daily Ivor Costa, MD       hydrALAZINE (APRESOLINE) injection 5 mg  5 mg Intravenous Q2H PRN Ivor Costa, MD       insulin aspart (novoLOG) injection 0-5 Units  0-5 Units Subcutaneous QHS Ivor Costa, MD       insulin aspart (novoLOG) injection 0-9 Units  0-9 Units Subcutaneous TID WC Ivor Costa, MD   2 Units at 08/03/21 1223   insulin glargine-yfgn (SEMGLEE) injection 15 Units  15 Units Subcutaneous QHS Ivor Costa, MD       magnesium oxide (MAG-OX) tablet 400 mg  400 mg Oral TID Ivor Costa, MD   400 mg at 08/03/21 1138   metoprolol tartrate (LOPRESSOR) tablet 50 mg  50 mg Oral BID Ivor Costa, MD   50 mg at 08/03/21 1138   morphine 2 MG/ML injection 1 mg  1 mg Intravenous Q4H PRN Ivor Costa, MD       ondansetron [facility]) injection 4 mg  4 mg Intravenous Q8H PRN Ivor Costa, MD       [START ON 08/06/2021] pantoprazole (PROTONIX) injection 40 mg  40 mg Intravenous Q12H Ivor Costa, MD  pantoprozole (PROTONIX) 80 mg /NS 100 mL infusion  8 mg/hr  Intravenous Continuous Ivor Costa, MD 10 mL/hr at 08/03/21 0929 8 mg/hr at 08/03/21 0929   prothrombin complex conc human (KCENTRA) IVPB 4,404 Units  4,404 Units Intravenous Gordy Councilman, MD 384 mL/hr at 08/03/21 1218 4,404 Units at 08/03/21 1218   sildenafil (REVATIO) tablet 20 mg  20 mg Oral TID Ivor Costa, MD       vitamin B-12 (CYANOCOBALAMIN) tablet 1,000 mcg  1,000 mcg Oral Daily Ivor Costa, MD       Current Outpatient Medications  Medication Sig Dispense Refill   acetaminophen (TYLENOL) 500 MG tablet Take 1,000 mg by mouth every 6 (six) hours as needed for moderate pain or headache.     albuterol (VENTOLIN HFA) 108 (90 Base) MCG/ACT inhaler Inhale 2 puffs into the lungs daily.     allopurinol (ZYLOPRIM) 100 MG tablet Take 2 tablets by mouth daily.     colchicine 0.6 MG tablet Take 0.6 mg by mouth daily as needed (gout flare).     sodium chloride (OCEAN) 0.65 % SOLN nasal spray Place 1 spray into both nostrils as needed for congestion.     allopurinol (ZYLOPRIM) 100 MG tablet Take 100 mg by mouth daily. (Patient not taking: Reported on 08/03/2021)     amLODipine (NORVASC) 5 MG tablet Take 5 mg by mouth daily.     ascorbic acid (VITAMIN C) 500 MG tablet Take 500 mg by mouth daily.     atorvastatin (LIPITOR) 20 MG tablet Take 20 mg by mouth at bedtime.      BREO ELLIPTA 100-25 MCG/INH AEPB Inhale 1 puff into the lungs daily.     Cholecalciferol (DIALYVITE VITAMIN D 5000) 125 MCG (5000 UT) capsule Take 5,000 Units by mouth daily.     ELIQUIS 5 MG TABS tablet SMARTSIG:1 Tablet(s) By Mouth Every 12 Hours     finasteride (PROSCAR) 5 MG tablet Take 1 tablet (5 mg total) by mouth daily. 30 tablet 11   furosemide (LASIX) 80 MG tablet Take 40 mg by mouth daily.     insulin aspart (NOVOLOG) 100 UNIT/ML FlexPen Inject 20-26 Units into the skin 3 (three) times daily with meals. 20 units am 26 units noon, 26 units evening     insulin degludec (TRESIBA FLEXTOUCH) 200 UNIT/ML FlexTouch Pen Inject 46  Units into the skin at bedtime.     lisinopril (ZESTRIL) 20 MG tablet Take 10 mg by mouth at bedtime.      magnesium oxide (MAG-OX) 400 MG tablet Take 1 tablet by mouth 3 (three) times daily.     metoprolol tartrate (LOPRESSOR) 25 MG tablet Take 50 mg by mouth 2 (two) times daily.      pantoprazole (PROTONIX) 40 MG tablet Take 1 tablet (40 mg total) by mouth 2 (two) times daily before a meal. 60 tablet 0   sildenafil (REVATIO) 20 MG tablet Take 20 mg by mouth 3 (three) times daily.     TYVASO REFILL 0.6 MG/ML SOLN Inhale 0.6 mg into the lungs in the morning, at noon, in the evening, and at bedtime.     vitamin B-12 (CYANOCOBALAMIN) 1000 MCG tablet Take 1,000 mcg by mouth daily.      Allergies as of 08/03/2021 - Review Complete 08/03/2021  Allergen Reaction Noted   Shrimp [shellfish allergy] Anaphylaxis 11/21/2017   Shrimp extract allergy skin test  07/03/2020     Review of Systems:    All systems reviewed and negative except where  noted in HPI.     Physical Exam:  Vital signs in last 24 hours: Temp:  [97.6 F (36.4 C)-97.8 F (36.6 C)] 97.8 F (36.6 C) (01/16 1136) Pulse Rate:  [101-118] 118 (01/16 1136) Resp:  [17-25] 20 (01/16 1136) BP: (111-149)/(52-58) 149/56 (01/16 1136) SpO2:  [93 %-99 %] 93 % (01/16 1136) Weight:  [81.6 kg] 81.6 kg (01/16 2130)   General:   Pleasant elderly man in NAD Head:  Normocephalic and atraumatic. Eyes:   No icterus.   Conjunctiva pink. Ears:  Normal auditory acuity. Mouth: Mucosa pink moist, no lesions. Neck:  Supple; no masses felt Lungs:  Respirations even, mildly tachypneic. Lungs clear to auscultation bilaterally.   No wheezes, crackles, or rhonchi.  Heart:  S1S2, RRR, no MRG. No edema. Mild tachycardia, sinus Abdomen:   mild diastasis recti, flat soft, nondistended, periumbilical tendernss that radiates posteriorly to palpation. Normal bowel sounds. No appreciable masses or hepatomegaly. No rebound signs or other peritoneal  signs. Rectal:  Not performed.  Msk:  MAEW x4, No clubbing or cyanosis. Strength 5/5. Symmetrical without gross deformities. Neurologic:  Alert and  oriented x4;  Cranial nerves II-XII intact.  Skin:  Warm, dry, pink without significant lesions or rashes. Psych:  Alert and cooperative. Normal affect.  LAB RESULTS: Recent Labs    08/03/21 0642 08/03/21 1053  WBC 12.7* 15.2*  HGB 6.3* 6.2*  HCT 19.4* 18.8*  PLT 293 284   BMET Recent Labs    08/03/21 0642  NA 138  K 4.2  CL 106  CO2 23  GLUCOSE 153*  BUN 63*  CREATININE 2.28*  CALCIUM 8.8*   LFT Recent Labs    08/03/21 0832  PROT 6.1*  ALBUMIN 3.5  AST 12*  ALT 13  ALKPHOS 26*  BILITOT 0.5  BILIDIR <0.1  IBILI NOT CALCULATED   PT/INR Recent Labs    08/03/21 0832  LABPROT 15.9*  INR 1.3*    STUDIES: CT ABDOMEN PELVIS WO CONTRAST  Result Date: 08/03/2021 CLINICAL DATA:  Acute non localized abdominal pain. Yesterday dizziness and lightheadedness. Vomited blood. EXAM: CT ABDOMEN AND PELVIS WITHOUT CONTRAST TECHNIQUE: Multidetector CT imaging of the abdomen and pelvis was performed following the standard protocol without IV contrast. RADIATION DOSE REDUCTION: This exam was performed according to the departmental dose-optimization program which includes automated exposure control, adjustment of the mA and/or kV according to patient size and/or use of iterative reconstruction technique. COMPARISON:  Renal ultrasound 01/29/2021, CT abdomen and pelvis without contrast 06/17/2019 FINDINGS: Lower chest: There is again interlobular septal thickening within the lung bases. Interval mildly improved aeration with persistent predominantly peripheral scarring, raising the question of chronic interstitial lung disease. Heart size is again mildly enlarged. Trace pericardial fluid. Dense coronary artery calcifications. Aortic root calcification. Lack of intra-articular fluid limits evaluation of the abdominal and pelvic organ  parenchyma. The following findings are made within this limitation. Hepatobiliary: A subcentimeter low-density focus within the right hepatic lobe seen on 06/17/2019 noncontrast CT is less well visualized and appears decreased in size (axial image 18). This again suggests a benign process. The gallbladder is grossly unremarkable. Pancreas: Unremarkable. No pancreatic ductal dilatation or surrounding inflammatory changes. Spleen: Normal in size without focal abnormality. Adrenals/Urinary Tract: Normal adrenals. Stable chronic bilateral perinephric fat stranding. No renal stone or hydronephrosis. Within the limitations of lack of IV contrast, no contour deforming renal mass. The urinary bladder is grossly unremarkable. Stomach/Bowel: Moderate to high-grade stool throughout all segments of the colon compatible with constipation.  No bowel wall thickening. The terminal ileum is unremarkable. The appendix is within normal limits. No dilated loops of bowel to indicate bowel obstruction. Vascular/Lymphatic: No abdominal aortic aneurysm. High-grade atherosclerotic calcifications are similar to prior. No enlarged abdominal or pelvic lymph nodes. Reproductive: Prostate is unremarkable. Other: No abdominal wall hernia. No free air or free fluid. Musculoskeletal: Mild levocurvature of the lower lumbar spine. Moderate to severe multilevel degenerative disc changes with large anterior L2-3 through L4-5 endplate osteophytes. IMPRESSION:: IMPRESSION: 1. Moderate to high-grade stool throughout all segments of the colon compatible with constipation. 2. No acute abnormality is seen within the abdomen or pelvis. 3. Likely moderate chronic scarring within the lung bases. 4. Mild cardiomegaly.  Aortic Atherosclerosis (ICD10-I70.0). Electronically Signed   By: Yvonne Kendall M.D.   On: 08/03/2021 09:22   DG Chest 2 View  Result Date: 08/03/2021 CLINICAL DATA:  Shortness of breath and weakness. EXAM: CHEST - 2 VIEW COMPARISON:   10/03/2019 FINDINGS: Stable cardiomediastinal contours. Lung volumes are low. Diffuse coarsened interstitial markings are identified bilaterally reflecting a combination of COPD/emphysema and pulmonary fibrosis. No superimposed airspace consolidation. IMPRESSION: 1. Advanced chronic interstitial lung disease without acute superimposed findings. Electronically Signed   By: Kerby Moors M.D.   On: 08/03/2021 07:46       Impression / Plan:    Melena, hematemesis, abdominal pain- agree with transfusions and pantoprazole/following hgb. Recommending egd as clinically feasible- would like hgb >7- may need to be tomorrow Lower abdominal pain/constipation- will address the above- then likely recommend stool softening agents.  Thank you very much for this consult. These services were provided by Stephens November, NP-C, in collaboration with Lesly Rubenstein, MD, with whom I have discussed this patient in full.   Stephens November, NP-C

## 2021-08-03 NOTE — ED Notes (Signed)
Pt catheterized with in/out catheter to drain bladder. Pt self caths at home for relief.   648m Urine output

## 2021-08-03 NOTE — ED Notes (Signed)
Patient back from CT.

## 2021-08-04 ENCOUNTER — Inpatient Hospital Stay: Payer: Medicare HMO | Admitting: Anesthesiology

## 2021-08-04 ENCOUNTER — Inpatient Hospital Stay: Payer: Medicare HMO

## 2021-08-04 ENCOUNTER — Encounter
Admission: EM | Disposition: A | Discharge status: Home / Self Care | Payer: Self-pay | Source: Home / Self Care | Attending: Student

## 2021-08-04 ENCOUNTER — Encounter: Payer: Self-pay | Admitting: Internal Medicine

## 2021-08-04 DIAGNOSIS — K921 Melena: Secondary | ICD-10-CM

## 2021-08-04 HISTORY — PX: ESOPHAGOGASTRODUODENOSCOPY (EGD) WITH PROPOFOL: SHX5813

## 2021-08-04 LAB — BASIC METABOLIC PANEL
Anion gap: 7 (ref 5–15)
BUN: 54 mg/dL — ABNORMAL HIGH (ref 8–23)
CO2: 26 mmol/L (ref 22–32)
Calcium: 9.3 mg/dL (ref 8.9–10.3)
Chloride: 105 mmol/L (ref 98–111)
Creatinine, Ser: 2.28 mg/dL — ABNORMAL HIGH (ref 0.61–1.24)
GFR, Estimated: 29 mL/min — ABNORMAL LOW (ref >60)
Glucose, Bld: 140 mg/dL — ABNORMAL HIGH (ref 70–99)
Potassium: 4.4 mmol/L (ref 3.5–5.1)
Sodium: 138 mmol/L (ref 135–145)

## 2021-08-04 LAB — CBC
HCT: 26 % — ABNORMAL LOW (ref 39.0–52.0)
HCT: 27.2 % — ABNORMAL LOW (ref 39.0–52.0)
HCT: 23.4 % — ABNORMAL LOW (ref 39.0–52.0)
HCT: 23.2 % — ABNORMAL LOW (ref 39.0–52.0)
Hemoglobin: 8.4 g/dL — ABNORMAL LOW (ref 13.0–17.0)
Hemoglobin: 9 g/dL — ABNORMAL LOW (ref 13.0–17.0)
Hemoglobin: 7.8 g/dL — ABNORMAL LOW (ref 13.0–17.0)
Hemoglobin: 7.7 g/dL — ABNORMAL LOW (ref 13.0–17.0)
MCH: 30.1 pg (ref 26.0–34.0)
MCH: 30.7 pg (ref 26.0–34.0)
MCH: 31.2 pg (ref 26.0–34.0)
MCH: 30.8 pg (ref 26.0–34.0)
MCHC: 32.3 g/dL (ref 30.0–36.0)
MCHC: 33.1 g/dL (ref 30.0–36.0)
MCHC: 33.3 g/dL (ref 30.0–36.0)
MCHC: 33.2 g/dL (ref 30.0–36.0)
MCV: 93.2 fL (ref 80.0–100.0)
MCV: 92.8 fL (ref 80.0–100.0)
MCV: 93.6 fL (ref 80.0–100.0)
MCV: 92.8 fL (ref 80.0–100.0)
Platelets: 286 10*3/uL (ref 150–400)
Platelets: 259 10*3/uL (ref 150–400)
Platelets: 258 10*3/uL (ref 150–400)
Platelets: 247 10*3/uL (ref 150–400)
RBC: 2.79 MIL/uL — ABNORMAL LOW (ref 4.22–5.81)
RBC: 2.93 MIL/uL — ABNORMAL LOW (ref 4.22–5.81)
RBC: 2.5 MIL/uL — ABNORMAL LOW (ref 4.22–5.81)
RBC: 2.5 MIL/uL — ABNORMAL LOW (ref 4.22–5.81)
RDW: 18.9 % — ABNORMAL HIGH (ref 11.5–15.5)
RDW: 18.9 % — ABNORMAL HIGH (ref 11.5–15.5)
RDW: 18.6 % — ABNORMAL HIGH (ref 11.5–15.5)
RDW: 18.5 % — ABNORMAL HIGH (ref 11.5–15.5)
WBC: 11.7 10*3/uL — ABNORMAL HIGH (ref 4.0–10.5)
WBC: 11.5 10*3/uL — ABNORMAL HIGH (ref 4.0–10.5)
WBC: 10.3 10*3/uL (ref 4.0–10.5)
WBC: 10.2 10*3/uL (ref 4.0–10.5)
nRBC: 0 % (ref 0.0–0.2)
nRBC: 0 % (ref 0.0–0.2)
nRBC: 0 % (ref 0.0–0.2)
nRBC: 0 % (ref 0.0–0.2)

## 2021-08-04 LAB — CBG MONITORING, ED: Glucose-Capillary: 143 mg/dL — ABNORMAL HIGH (ref 70–99)

## 2021-08-04 LAB — GLUCOSE, CAPILLARY
Glucose-Capillary: 138 mg/dL — ABNORMAL HIGH (ref 70–99)
Glucose-Capillary: 148 mg/dL — ABNORMAL HIGH (ref 70–99)

## 2021-08-04 SURGERY — ESOPHAGOGASTRODUODENOSCOPY (EGD) WITH PROPOFOL
Anesthesia: General

## 2021-08-04 MED ORDER — SENNOSIDES-DOCUSATE SODIUM 8.6-50 MG PO TABS
2.0000 | ORAL_TABLET | Freq: Two times a day (BID) | ORAL | Status: DC
  Administered 2021-08-04 – 2021-08-07 (×7): 2 via ORAL
  Filled 2021-08-04 (×8): qty 2

## 2021-08-04 MED ORDER — PROPOFOL 500 MG/50ML IV EMUL
INTRAVENOUS | Status: DC | PRN
  Administered 2021-08-04: 100 ug/kg/min via INTRAVENOUS

## 2021-08-04 MED ORDER — PHENYLEPHRINE HCL (PRESSORS) 10 MG/ML IV SOLN
INTRAVENOUS | Status: DC | PRN
  Administered 2021-08-04: 80 ug via INTRAVENOUS

## 2021-08-04 MED ORDER — PROPOFOL 10 MG/ML IV BOLUS
INTRAVENOUS | Status: DC | PRN
  Administered 2021-08-04: 10 mg via INTRAVENOUS
  Administered 2021-08-04: 30 mg via INTRAVENOUS

## 2021-08-04 MED ORDER — SODIUM CHLORIDE 0.9 % IV SOLN: INTRAVENOUS | Status: DC

## 2021-08-04 NOTE — Transfer of Care (Signed)
Immediate Anesthesia Transfer of Care Note  Patient: Jay Morris  Procedure(s) Performed: ESOPHAGOGASTRODUODENOSCOPY (EGD) WITH PROPOFOL  Patient Location: PACU  Anesthesia Type:General  Level of Consciousness: awake, alert  and oriented  Airway & Oxygen Therapy: Patient Spontanous Breathing and Patient connected to face mask oxygen  Post-op Assessment: Report given to RN and Post -op Vital signs reviewed and stable  Post vital signs: Reviewed and stable  Last Vitals:  Vitals Value Taken Time  BP    Temp    Pulse    Resp    SpO2      Last Pain:  Vitals:   08/04/21 1039  TempSrc: Oral  PainSc:          Complications: No notable events documented.

## 2021-08-04 NOTE — ED Notes (Signed)
Increased patient's O2 to 4L

## 2021-08-04 NOTE — Care Plan (Signed)
EGD with two AVM's s/p APC. Ok to resume anticoagulation tomorrow as benefit likely outweighs risk of discontinuation.  Please call with any questions/concerns. We will follow peripherally.  Raylene Miyamoto MD, MPH [place]

## 2021-08-04 NOTE — ED Notes (Signed)
Pt straight cathed and 1200 ml straw yellow colored urine drained.

## 2021-08-04 NOTE — Anesthesia Preprocedure Evaluation (Signed)
Anesthesia Evaluation  Patient identified by MRN, date of birth, ID band Patient awake    Reviewed: Allergy & Precautions, NPO status , Patient's Chart, lab work & pertinent test results  History of Anesthesia Complications Negative for: history of anesthetic complications  Airway Mallampati: II  TM Distance: >3 FB Neck ROM: Full    Dental  (+) Poor Dentition, Dental Advidsory Given, Edentulous Upper, Upper Dentures   Pulmonary shortness of breath, with exertion and Long-Term Oxygen Therapy, asthma , COPD,  COPD inhaler and oxygen dependent, neg recent URI, former smoker,    breath sounds clear to auscultation- rhonchi (-) wheezing      Cardiovascular Exercise Tolerance: Poor hypertension, Pt. on medications (-) angina+ Peripheral Vascular Disease and +CHF  (-) CAD, (-) Past MI, (-) Cardiac Stents and (-) CABG + dysrhythmias Atrial Fibrillation + Valvular Problems/Murmurs  Rhythm:Regular Rate:Normal - Systolic murmurs and - Diastolic murmurs    Neuro/Psych negative neurological ROS  negative psych ROS   GI/Hepatic Neg liver ROS, GIB   Endo/Other  diabetes, Insulin Dependent  Renal/GU CRFRenal disease     Musculoskeletal negative musculoskeletal ROS (+)   Abdominal (+) + obese,   Peds  Hematology  (+) Blood dyscrasia, anemia ,   Anesthesia Other Findings Past Medical History: No date: Asthma No date: Atrial fibrillation (HCC) No date: BPH (benign prostatic hyperplasia) No date: COPD (chronic obstructive pulmonary disease) (HCC) No date: Diabetes mellitus without complication (HCC) No date: Helicobacter pylori gastritis No date: Hyperlipidemia No date: Hypertension No date: Pneumonia   Reproductive/Obstetrics                             Anesthesia Physical  Anesthesia Plan  ASA: 3  Anesthesia Plan: General   Post-op Pain Management:    Induction: Intravenous  PONV Risk Score  and Plan: 2 and Propofol infusion and TIVA  Airway Management Planned: Natural Airway and Nasal Cannula  Additional Equipment:   Intra-op Plan:   Post-operative Plan:   Informed Consent: I have reviewed the patients History and Physical, chart, labs and discussed the procedure including the risks, benefits and alternatives for the proposed anesthesia with the patient or authorized representative who has indicated his/her understanding and acceptance.     Dental advisory given  Plan Discussed with: CRNA and Anesthesiologist  Anesthesia Plan Comments:         Anesthesia Quick Evaluation

## 2021-08-04 NOTE — Op Note (Signed)
[facility] Patient Name: Jay Morris Procedure Date: 08/04/2021 1:04 PM MRN: [id number] Account #: [id number] Date of Birth: [birth year] Admit Type: Outpatient Age: 76 Room: [facility] ENDO ROOM 1 Gender: Male Note Status: Finalized Instrument Name: Upper Endoscope [id number] Procedure:             Upper GI endoscopy Indications:           Hematemesis Providers:             Andrey Farmer MD, MD Referring MD:          Leonie Douglas. Doy Hutching, MD (Referring MD) Medicines:             Monitored Anesthesia Care Complications:         No immediate complications. Estimated blood loss:                         Minimal. Procedure:             Pre-Anesthesia Assessment:                        - Prior to the procedure, a History and Physical was                         performed, and patient medications and allergies were                         reviewed. The patient is competent. The risks and                         benefits of the procedure and the sedation options and                         risks were discussed with the patient. All questions                         were answered and informed consent was obtained.                         Patient identification and proposed procedure were                         verified by the physician, the nurse, the                         anesthesiologist, the anesthetist and the technician                         in the endoscopy suite. Mental Status Examination:                         alert and oriented. Airway Examination: normal                         oropharyngeal airway and neck mobility. Respiratory                         Examination: clear to auscultation. CV Examination:  normal. Prophylactic Antibiotics: The patient does not                         require prophylactic antibiotics. Prior                         Anticoagulants: The patient has taken Eliquis                          (apixaban), last dose was 2 days prior to procedure.                         ASA Grade Assessment: IV - A patient with severe                         systemic disease that is a constant threat to life.                         After reviewing the risks and benefits, the patient                         was deemed in satisfactory condition to undergo the                         procedure. The anesthesia plan was to use monitored                         anesthesia care (MAC). Immediately prior to                         administration of medications, the patient was                         re-assessed for adequacy to receive sedatives. The                         heart rate, respiratory rate, oxygen saturations,                         blood pressure, adequacy of pulmonary ventilation, and                         response to care were monitored throughout the                         procedure. The physical status of the patient was                         re-assessed after the procedure.                        After obtaining informed consent, the endoscope was                         passed under direct vision. Throughout the procedure,                         the patient's blood pressure, pulse, and oxygen  saturations were monitored continuously. The Endoscope                         was introduced through the mouth, and advanced to the                         second part of duodenum. The upper GI endoscopy was                         accomplished without difficulty. The patient tolerated                         the procedure well. Findings:      The examined esophagus was normal.      Two 1 to 2 mm angioectasias with stigmata of recent bleeding were found       in the gastric fundus and on the greater curvature of the gastric body.       Coagulation for hemostasis using argon plasma was successful. Estimated       blood loss was minimal.      The exam of the stomach  was otherwise normal.      The examined duodenum was normal. Impression:            - Normal esophagus.                        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                         Treated with argon plasma coagulation (APC).                        - Normal examined duodenum.                        - No specimens collected. Recommendation:        - Return patient to hospital ward for ongoing care.                        - Advance diet as tolerated.                        - Resume Eliquis (apixaban) at prior dose tomorrow.                        - Further management per primary team Procedure Code(s):     --- Professional ---                        [phone], Esophagogastroduodenoscopy, flexible,                         transoral; with control of bleeding, any method Diagnosis Code(s):     --- Professional ---                        Q94.765, Angiodysplasia of stomach and duodenum with                         bleeding  K92.0, Hematemesis CPT copyright 2019 American Medical Association. All rights reserved. The codes documented in this report are preliminary and upon coder review may  be revised to meet current compliance requirements. Andrey Farmer MD, MD 08/04/2021 1:32:53 PM Number of Addenda: 0 Note Initiated On: 08/04/2021 1:04 PM Estimated Blood Loss:  Estimated blood loss was minimal.      [facility]

## 2021-08-04 NOTE — ED Notes (Signed)
Assisted patient to the toilet using a walker. Instructed to pull call bell when he is finished. Pt verbalized understanding

## 2021-08-04 NOTE — Progress Notes (Addendum)
PROGRESS NOTE    Daris Harkins  DXF:[id number] DOB: 02/27/1946 DOA: 08/03/2021 PCP: [name], MD   Chief complaint.  Hematemesis. Brief Narrative:  Shell Yandow is a 76 y.o. male with medical history significant of hypertension, hyperlipidemia, diabetes mellitus, COPD on 2-3 L oxygen, GERD, gout, pulmonary hypertension, CHF, atrial fibrillation on Eliquis, BPH with chronic urinary retention (doing self cath twice a day),, CKD-4, anemia, CAD, GI bleeding, PVD, MGUS, who presents with dark stool and hematemesis.  He received 2 units of PRBC for hemoglobin of 6.3.  CT abdomen/pelvis did not show any acute changes.  Showed significant constipation.   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 Acute blood loss anemia with anemia of chronic kidney disease. Patient has been seen by GI, going to EGD today. Hemoglobin is more stable after transfusion.  No additional black stool today. Continue monitor hemoglobin and transfuse as needed.  Constipation Scheduled senna.  COPD. Stable.  Moderate to severe pulmonary hypertension. Chronic diastolic congestive heart failure. Essential hypertension. Patient currently does not have any volume overload.  Continue to follow.  Type 2 diabetes. Continue insulin glargine and sliding scale insulin.   Chronic kidney disease stage IV. Continue to follow.  Benign prostate hypertrophy. Patient is able to self cath.  Back pain. Will check renal ultrasound  to rule out a renal kidney stone.   DVT prophylaxis: SCDs Code Status: full Family Communication: Wife at the bedside. Disposition Plan:    Status is: Inpatient  Remains inpatient appropriate because: Severity of disease.        I/O last 3 completed shifts: In: 100 [I.V.:100] Out: 36 [Urine:1900] Total I/O In: 100 [I.V.:100] Out: -      Consultants:  GI  Procedures: EGD  Antimicrobials: none  Subjective: Patient doing better today, no additional GI bleed. Denies any short of breath or cough No fever chills  No abdominal pain or nausea vomiting.  Objective: Vitals:   08/04/21 1335 08/04/21 1345 08/04/21 1355 08/04/21 1405  BP: (!) 104/46 (!) 111/53 (!) 113/50 (!) 121/56  Pulse: (!) 55 (!) 59 (!) 58 60  Resp: _0 Temp: (!) 97.2 F (36.2 C)     TempSrc: Temporal     SpO2: 100% 96% 100% 98%  Weight:        Intake/Output Summary (Last 24 hours) at 08/04/2021 1412 Last data filed at 08/04/2021 1326 Gross per 24 hour  Intake 200.03 ml  Output 1200 ml  Net -999.97 ml   Filed Weights   08/03/21 0637  Weight: 81.6 kg    Examination:  General exam: Appears calm and comfortable  Respiratory system: Clear to auscultation. Respiratory effort normal. Cardiovascular system: S1 & S2 heard, RRR. No JVD, murmurs, rubs, gallops or clicks. No pedal edema. Gastrointestinal system: Abdomen is nondistended, soft and nontender. No organomegaly or masses felt. Normal bowel sounds heard. Central nervous system: Alert and oriented. No focal neurological deficits. Extremities: Symmetric 5 x 5 power. Skin: No rashes,  lesions or ulcers Psychiatry: Judgement and insight appear normal. Mood & affect appropriate.     Data Reviewed: I have personally reviewed following labs and imaging studies  CBC: Recent Labs  Lab 08/03/21 0642 08/03/21 1053 08/03/21 1635 08/04/21 0631 08/04/21 1100  WBC 12.7* 15.2* 11.0* 11.7* 11.5*  NEUTROABS 9.0*  --   --   --   --    HGB 6.3* 6.2* 7.9* 8.4* 9.0*  HCT 19.4* 18.8* 23.8* 26.0* 27.2*  MCV 99.5 99.5 93.7 93.2 92.8  PLT 293 284 264 286 341   Basic Metabolic Panel: Recent Labs  Lab 08/03/21 0642 08/04/21 0631  NA 138 138  K 4.2 4.4  CL 106 105  CO2 23 26  GLUCOSE 153* 140*  BUN 63* 54*  CREATININE 2.28* 2.28*  CALCIUM 8.8* 9.3   GFR: Estimated Creatinine Clearance: 26.3 mL/min (A) (by C-G formula based on SCr of 2.28 mg/dL (H)). Liver Function Tests: Recent Labs  Lab 08/03/21 0832  AST 12*  ALT 13  ALKPHOS 26*  BILITOT 0.5  PROT 6.1*  ALBUMIN 3.5   No results for input(s): LIPASE, AMYLASE in the last 168 hours. No results for input(s): AMMONIA in the last 168 hours. Coagulation Profile: Recent Labs  Lab 08/03/21 0832  INR 1.3*   Cardiac Enzymes: No results for input(s): CKTOTAL, CKMB, CKMBINDEX, TROPONINI in the last 168 hours. BNP (last 3 results) No results for input(s): PROBNP in the last 8760 hours. HbA1C: No results for input(s): HGBA1C in the last 72 hours. CBG: Recent Labs  Lab 08/03/21 1133 08/03/21 1630 08/03/21 2244 08/04/21 0759  GLUCAP 176* 135* 136* 143*   Lipid Profile: No results for input(s): CHOL, HDL, LDLCALC, TRIG, CHOLHDL, LDLDIRECT in the last 72 hours. Thyroid Function Tests: No results for input(s): TSH, T4TOTAL, FREET4, T3FREE, THYROIDAB in the last 72 hours. Anemia Panel: Recent Labs    08/03/21 0831 08/03/21 0832  VITAMINB12 747  --   FOLATE  --  15.2  FERRITIN  --  206  TIBC  --  295  IRON  --  50  RETICCTPCT  --  2.5   Sepsis Labs: Recent Labs  Lab 08/03/21 0831  LATICACIDVEN 1.6    Recent Results (from the past 240 hour(s))  Resp Panel by RT-PCR (Flu A&B, Covid) Nasopharyngeal Swab     Status: None   Collection Time: 08/03/21  6:42 AM   Specimen: Nasopharyngeal Swab; Nasopharyngeal(NP) swabs in vial transport medium  Result Value Ref Range Status   SARS Coronavirus 2 by RT PCR NEGATIVE NEGATIVE Final    Comment:  (NOTE) SARS-CoV-2 target nucleic acids are NOT DETECTED.  The SARS-CoV-2 RNA is generally detectable in upper respiratory specimens during the acute phase of infection. The lowest concentration of SARS-CoV-2 viral copies this assay can detect is 138 copies/mL. A negative result does not preclude SARS-Cov-2 infection and should not be used as the sole basis for treatment or other patient management decisions. A negative result may occur with  improper specimen collection/handling, submission of specimen other than nasopharyngeal swab, presence of viral mutation(s) within the areas targeted by this assay, and inadequate number of viral copies(<138 copies/mL). A negative result must be combined with clinical observations, patient history, and epidemiological information. The expected result is Negative.  Fact Sheet for Patients:  EntrepreneurPulse.com.au  Fact Sheet for Healthcare Providers:  IncredibleEmployment.be  This test is no t yet approved or cleared by the [place] FDA and  has been authorized for detection and/or diagnosis  of SARS-CoV-2 by FDA under an Emergency Use Authorization (EUA). This EUA will remain  in effect (meaning this test can be used) for the duration of the COVID-19 declaration under Section 564(b)(1) of the Act, 21 U.S.C.section 360bbb-3(b)(1), unless the authorization is terminated  or revoked sooner.       Influenza A by PCR NEGATIVE NEGATIVE Final   Influenza B by PCR NEGATIVE NEGATIVE Final    Comment: (NOTE) The Xpert Xpress SARS-CoV-2/FLU/RSV plus assay is intended as an aid in the diagnosis of influenza from Nasopharyngeal swab specimens and should not be used as a sole basis for treatment. Nasal washings and aspirates are unacceptable for Xpert Xpress SARS-CoV-2/FLU/RSV testing.  Fact Sheet for Patients: EntrepreneurPulse.com.au  Fact Sheet for Healthcare  Providers: IncredibleEmployment.be  This test is not yet approved or cleared by the [place] FDA and has been authorized for detection and/or diagnosis of SARS-CoV-2 by FDA under an Emergency Use Authorization (EUA). This EUA will remain in effect (meaning this test can be used) for the duration of the COVID-19 declaration under Section 564(b)(1) of the Act, 21 U.S.C. section 360bbb-3(b)(1), unless the authorization is terminated or revoked.  Performed at [facility], [address] [place], [place] [address]   Blood culture (routine x 2)     Status: None (Preliminary result)   Collection Time: 08/03/21  8:32 AM   Specimen: BLOOD  Result Value Ref Range Status   Specimen Description BLOOD RIGHT ARM  Final   Special Requests   Final    BOTTLES DRAWN AEROBIC AND ANAEROBIC Blood Culture results may not be optimal due to an excessive volume of blood received in culture bottles   Culture   Final    NO GROWTH < 24 HOURS Performed at [facility], [address], [place], [place] [address]    Report Status PENDING  Incomplete  Blood culture (routine x 2)     Status: None (Preliminary result)   Collection Time: 08/03/21  8:32 AM   Specimen: BLOOD  Result Value Ref Range Status   Specimen Description BLOOD RIGHT [facility]  Final   Special Requests   Final    BOTTLES DRAWN AEROBIC AND ANAEROBIC Blood Culture results may not be optimal due to an excessive volume of blood received in culture bottles   Culture   Final    NO GROWTH < 24 HOURS Performed at [facility], [address] [place], [place] [address]    Report Status PENDING  Incomplete         Radiology Studies: CT ABDOMEN PELVIS WO CONTRAST  Result Date: 08/03/2021 CLINICAL DATA:  Acute non localized abdominal pain. Yesterday dizziness and lightheadedness. Vomited blood. EXAM: CT ABDOMEN AND PELVIS WITHOUT CONTRAST TECHNIQUE: Multidetector CT imaging of the abdomen and  pelvis was performed following the standard protocol without IV contrast. RADIATION DOSE REDUCTION: This exam was performed according to the departmental dose-optimization program which includes automated exposure control, adjustment of the mA and/or kV according to patient size and/or use of iterative reconstruction technique. COMPARISON:  Renal ultrasound 01/29/2021, CT abdomen and pelvis without contrast 06/17/2019 FINDINGS: Lower chest: There is again interlobular septal thickening within the lung bases. Interval mildly improved aeration with persistent predominantly peripheral scarring, raising the question of chronic interstitial lung disease. Heart size is again mildly enlarged. Trace pericardial fluid. Dense coronary artery calcifications. Aortic root calcification. Lack of intra-articular fluid limits evaluation of the abdominal and pelvic organ parenchyma. The following findings are made within this  limitation. Hepatobiliary: A subcentimeter low-density focus within the right hepatic lobe seen on 06/17/2019 noncontrast CT is less well visualized and appears decreased in size (axial image 18). This again suggests a benign process. The gallbladder is grossly unremarkable. Pancreas: Unremarkable. No pancreatic ductal dilatation or surrounding inflammatory changes. Spleen: Normal in size without focal abnormality. Adrenals/Urinary Tract: Normal adrenals. Stable chronic bilateral perinephric fat stranding. No renal stone or hydronephrosis. Within the limitations of lack of IV contrast, no contour deforming renal mass. The urinary bladder is grossly unremarkable. Stomach/Bowel: Moderate to high-grade stool throughout all segments of the colon compatible with constipation. No bowel wall thickening. The terminal ileum is unremarkable. The appendix is within normal limits. No dilated loops of bowel to indicate bowel obstruction. Vascular/Lymphatic: No abdominal aortic aneurysm. High-grade atherosclerotic  calcifications are similar to prior. No enlarged abdominal or pelvic lymph nodes. Reproductive: Prostate is unremarkable. Other: No abdominal wall hernia. No free air or free fluid. Musculoskeletal: Mild levocurvature of the lower lumbar spine. Moderate to severe multilevel degenerative disc changes with large anterior L2-3 through L4-5 endplate osteophytes. IMPRESSION:: IMPRESSION: 1. Moderate to high-grade stool throughout all segments of the colon compatible with constipation. 2. No acute abnormality is seen within the abdomen or pelvis. 3. Likely moderate chronic scarring within the lung bases. 4. Mild cardiomegaly.  Aortic Atherosclerosis (ICD10-I70.0). Electronically Signed   By: Yvonne Kendall M.D.   On: 08/03/2021 09:22   DG Chest 2 View  Result Date: 08/03/2021 CLINICAL DATA:  Shortness of breath and weakness. EXAM: CHEST - 2 VIEW COMPARISON:  10/03/2019 FINDINGS: Stable cardiomediastinal contours. Lung volumes are low. Diffuse coarsened interstitial markings are identified bilaterally reflecting a combination of COPD/emphysema and pulmonary fibrosis. No superimposed airspace consolidation. IMPRESSION: 1. Advanced chronic interstitial lung disease without acute superimposed findings. Electronically Signed   By: Kerby Moors M.D.   On: 08/03/2021 07:46        Scheduled Meds:  [MAR Hold] allopurinol  200 mg Oral Daily   [MAR Hold] ascorbic acid  500 mg Oral Daily   [MAR Hold] atorvastatin  20 mg Oral QHS   [MAR Hold] cholecalciferol  5,000 Units Oral Daily   [MAR Hold] finasteride  5 mg Oral Daily   [MAR Hold] fluticasone furoate-vilanterol  1 puff Inhalation Daily   [MAR Hold] furosemide  40 mg Oral Daily   [MAR Hold] insulin aspart  0-5 Units Subcutaneous QHS   [MAR Hold] insulin aspart  0-9 Units Subcutaneous TID WC   [MAR Hold] insulin glargine-yfgn  15 Units Subcutaneous QHS   [MAR Hold] magnesium oxide  400 mg Oral TID   [MAR Hold] metoprolol tartrate  50 mg Oral BID   [MAR Hold]  pantoprazole  40 mg Intravenous Q12H   [MAR Hold] sildenafil  20 mg Oral TID   [MAR Hold] Treprostinil  18 mcg Inhalation QID   [MAR Hold] vitamin B-12  1,000 mcg Oral Daily   Continuous Infusions:  sodium chloride 20 mL/hr at 08/04/21 1256   pantoprazole Stopped (08/03/21 1946)     LOS: 1 day    Time spent: 26 minutes    Sharen Hones, MD [facility]   To contact the attending provider between 7A-7P or the covering provider during after hours 7P-7A, please log into the web site [email or web address] and access using universal [place] password for that web site. If you do not have the password, please call the hospital operator.  08/04/2021, 2:12 PM

## 2021-08-04 NOTE — Anesthesia Procedure Notes (Signed)
Date/Time: 08/04/2021 1:04 PM Performed by: Nelda Marseille, CRNA Oxygen Delivery Method: Simple face mask

## 2021-08-04 NOTE — ED Notes (Signed)
Informed RN bed assigned 

## 2021-08-05 ENCOUNTER — Encounter: Payer: Self-pay | Admitting: Gastroenterology

## 2021-08-05 LAB — BASIC METABOLIC PANEL
Anion gap: 3 — ABNORMAL LOW (ref 5–15)
BUN: 50 mg/dL — ABNORMAL HIGH (ref 8–23)
CO2: 28 mmol/L (ref 22–32)
Calcium: 8.8 mg/dL — ABNORMAL LOW (ref 8.9–10.3)
Chloride: 105 mmol/L (ref 98–111)
Creatinine, Ser: 2.37 mg/dL — ABNORMAL HIGH (ref 0.61–1.24)
GFR, Estimated: 28 mL/min — ABNORMAL LOW (ref >60)
Glucose, Bld: 121 mg/dL — ABNORMAL HIGH (ref 70–99)
Potassium: 3.9 mmol/L (ref 3.5–5.1)
Sodium: 136 mmol/L (ref 135–145)

## 2021-08-05 LAB — CBC
HCT: 24 % — ABNORMAL LOW (ref 39.0–52.0)
Hemoglobin: 7.8 g/dL — ABNORMAL LOW (ref 13.0–17.0)
MCH: 30.4 pg (ref 26.0–34.0)
MCHC: 32.5 g/dL (ref 30.0–36.0)
MCV: 93.4 fL (ref 80.0–100.0)
Platelets: 267 10*3/uL (ref 150–400)
RBC: 2.57 MIL/uL — ABNORMAL LOW (ref 4.22–5.81)
RDW: 18.3 % — ABNORMAL HIGH (ref 11.5–15.5)
WBC: 8.9 10*3/uL (ref 4.0–10.5)
nRBC: 0 % (ref 0.0–0.2)

## 2021-08-05 LAB — GLUCOSE, CAPILLARY
Glucose-Capillary: 123 mg/dL — ABNORMAL HIGH (ref 70–99)
Glucose-Capillary: 168 mg/dL — ABNORMAL HIGH (ref 70–99)
Glucose-Capillary: 145 mg/dL — ABNORMAL HIGH (ref 70–99)
Glucose-Capillary: 183 mg/dL — ABNORMAL HIGH (ref 70–99)

## 2021-08-05 LAB — MAGNESIUM: Magnesium: 2.2 mg/dL (ref 1.7–2.4)

## 2021-08-05 LAB — VITAMIN D 25 HYDROXY (VIT D DEFICIENCY, FRACTURES): Vit D, 25-Hydroxy: 84.04 ng/mL (ref 30–100)

## 2021-08-05 MED ORDER — DARBEPOETIN ALFA 40 MCG/0.4ML IJ SOSY: 25.0000 ug | PREFILLED_SYRINGE | Freq: Once | INTRAMUSCULAR | Status: DC

## 2021-08-05 MED ORDER — FUROSEMIDE 40 MG PO TABS: 40.0000 mg | ORAL_TABLET | Freq: Every day | ORAL | Status: DC

## 2021-08-05 MED ORDER — METOPROLOL TARTRATE 50 MG PO TABS: 50.0000 mg | ORAL_TABLET | Freq: Two times a day (BID) | ORAL | Status: DC

## 2021-08-05 MED ORDER — ENSURE MAX PROTEIN PO LIQD: 11.0000 [oz_av] | Freq: Two times a day (BID) | ORAL | Status: DC

## 2021-08-05 MED ORDER — TAMSULOSIN HCL 0.4 MG PO CAPS
0.4000 mg | ORAL_CAPSULE | Freq: Every day | ORAL | Status: DC
  Administered 2021-08-05 – 2021-08-06 (×2): 0.4 mg via ORAL
  Filled 2021-08-05 (×2): qty 1

## 2021-08-05 MED ORDER — METOPROLOL TARTRATE 25 MG PO TABS: 12.5000 mg | ORAL_TABLET | Freq: Two times a day (BID) | ORAL | Status: DC

## 2021-08-05 MED ORDER — ENSURE MAX PROTEIN PO LIQD
11.0000 [oz_av] | Freq: Every day | ORAL | Status: DC
  Administered 2021-08-05 – 2021-08-07 (×3): 11 [oz_av] via ORAL
  Filled 2021-08-05: qty 330

## 2021-08-05 MED ORDER — APIXABAN 5 MG PO TABS
5.0000 mg | ORAL_TABLET | Freq: Two times a day (BID) | ORAL | Status: DC
  Administered 2021-08-05 – 2021-08-07 (×5): 5 mg via ORAL
  Filled 2021-08-05 (×5): qty 1

## 2021-08-05 NOTE — Progress Notes (Addendum)
Initial Nutrition Assessment  DOCUMENTATION CODES:   Not applicable  INTERVENTION:   Ensure Max protein supplement daily, each supplement provides 150kcal and 30g of protein.  NUTRITION DIAGNOSIS:   Inadequate oral intake related to acute illness as evidenced by other (comment) (pt on clear liquid diet).  GOAL:   Patient will meet greater than or equal to 90% of their needs  MONITOR:   PO intake, Supplement acceptance, Labs, Weight trends, Skin, I & O's  REASON FOR ASSESSMENT:   Malnutrition Screening Tool    ASSESSMENT:   76 y.o. male with medical history significant of hypertension, hyperlipidemia, diabetes mellitus, COPD on 2-3 L oxygen, GERD, gout, pulmonary hypertension, CHF, atrial fibrillation on Eliquis, BPH with chronic urinary retention (doing self cath twice a day),, CKD-4, anemia, CAD, GI bleeding, PVD and MGUS who presents with GIB.  Pt s/p EGD 1/17; pt noted to have angioectasias  Met with pt in room today. Pt reports good appetite and oral intake pta and in hospital. Pt reports eating 85% of his clear liquid diet today. Pt has been advanced to a soft diet for dinner. Pt reports a recent 10lb weight loss. Per chart, pt is down 8lbs(4%) since October; RD unsure how recently weight loss occurred. Pt reports that he has been drinking chocolate Boost sometimes at home and he would like to try the Ensure in hospital. RD will add supplements to help pt meet his estimated needs.   Medications reviewed and include: allopurinol, vitamin C, D3, lasix, insulin, Mg oxide, protonix, senokot, B12  Labs reviewed: K 3.9 wnl, BUN 50(H), creat 2.37(H), Mg 2.2 wnl Hgb 7.8(L), Hct 24.0(L) Cbgs- 168, 123 x 24 hrs  NUTRITION - FOCUSED PHYSICAL EXAM:  Flowsheet Row Most Recent Value  Orbital Region No depletion  Upper Arm Region No depletion  Thoracic and Lumbar Region No depletion  Buccal Region No depletion  Temple Region No depletion  Clavicle Bone Region No depletion   Clavicle and Acromion Bone Region No depletion  Scapular Bone Region No depletion  Dorsal Hand No depletion  Patellar Region No depletion  Anterior Thigh Region No depletion  Posterior Calf Region No depletion  Edema (RD Assessment) None  Hair Reviewed  Eyes Reviewed  Mouth Reviewed  Skin Reviewed  Nails Reviewed   Diet Order:   Diet Order             DIET SOFT Room service appropriate? Yes; Fluid consistency: Thin  Diet effective now                  EDUCATION NEEDS:   No education needs have been identified at this time  Skin:  Skin Assessment: Reviewed RN Assessment  Last BM:  1/12- constipation  Height:   Ht Readings from Last 1 Encounters:  07/30/21 5' 8" (1.727 m)    Weight:   Wt Readings from Last 1 Encounters:  08/03/21 81.6 kg    Ideal Body Weight:  70 kg  BMI:  Body mass index is 27.35 kg/m.  Estimated Nutritional Needs:   Kcal:  1800-2100kcal/day  Protein:  90-105g/day  Fluid:  1.8-2.1L/day  Koleen Distance MS, RD, LDN Please refer to [facility] for RD and/or RD on-call/weekend/after hours pager

## 2021-08-05 NOTE — Progress Notes (Signed)
[facility] Progress Note  Patient: Jay Morris    IOX:[id number]  DOA: 08/03/2021     Date of Service: the patient was seen and examined on 08/05/2021  Chief Complaint  Patient presents with   Back Pain   Shortness of Breath   Weakness   Near Syncope   Brief hospital course: Jay Morris is a 76 y.o. male with medical history significant of hypertension, hyperlipidemia, diabetes mellitus, COPD on 2-3 L oxygen, GERD, gout, pulmonary hypertension, CHF, atrial fibrillation on Eliquis, BPH with chronic urinary retention (doing self cath twice a day),, CKD-4, anemia, CAD, GI bleeding, PVD, MGUS, who presents with dark stool and hematemesis.  He received 2 units of PRBC for hemoglobin of 6.3.  CT abdomen/pelvis did not show any acute changes.  Showed significant constipation.    Assessment and Plan: Acute GI bleed. Acute blood loss anemia with anemia of chronic kidney disease. Hemoglobin is stable after transfusion.  No additional black stool today. Continue monitor hemoglobin and transfuse as needed. GI consulted s/p EGD, bleeding angiodysplasias s/p ACP, GI recommended to start Eliquis which was resumed on 1/18 Diet advanced to low residue Continue pantoprazole IV infusion for 72 hours, followed by PPI IV 40 mg every 12 hourly start on 1/19   Constipation Scheduled senna.    Chronic hypoxic respiratory failure, patient is on oxygen 2 L at rest and 3 to 4 L while ambulation at baseline,  COPD, Stable. Continue supplemental O2 inhalation  Moderate to severe pulmonary hypertension. Chronic diastolic congestive heart failure. Essential hypertension. Patient currently does not have any volume overload.  Continue to follow. Blood pressure soft and creatinine is still elevated so held beta-blocker and Lasix for now Continue monitor BP and creatinine level and then resume medications when needed  Type 2 diabetes. Continue insulin glargine and sliding scale insulin.      Chronic kidney disease stage IV. Continue to follow. Creatinine 2.37 Held Lasix for now  Benign prostate hypertrophy. Patient is able to self cath.   Continue Proscar 1/18 started Flomax  Back pain. renal ultrasound ruled out renal stone.   Body mass index is 27.35 kg/m.  Nutrition Problem: Inadequate oral intake Etiology: acute illness Interventions: Interventions: Ensure Enlive (each supplement provides 350kcal and 20 grams of protein)     Diet: Soft diet DVT Prophylaxis: SCD, pharmacological prophylaxis contraindicated due to GI bleeding    Advance goals of care discussion: Full code  Family Communication: family was present at bedside, at the time of interview.  The pt provided permission to discuss medical plan with the family. Opportunity was given to ask question and all questions were answered satisfactorily.   Disposition:  Pt is from Home, admitted with GI bleeding, still has low hemoglobin, which precludes a safe discharge. Discharge to home, when clinically stable, may require 1-2 more days.  Subjective: No significant events overnight, patient denied any bowel movements so no GI bleeding, no nausea vomiting.  Denied any chest pain or palpitation, no shortness of breath.  Physical Exam: General:  alert oriented to time, place, and person.  Appear in no distress, affect appropriate Eyes: PERRLA ENT: Oral Mucosa Clear, moist  Neck: no JVD,  Cardiovascular: S1 and S2 Present, no Murmur,  Respiratory: good respiratory effort, Bilateral Air entry equal and Decreased, no Crackles, no wheezes Abdomen: Bowel Sound present, Soft and no tenderness,  Skin: no rashes Extremities: no Pedal edema, no calf tenderness Neurologic: without any new focal findings Gait not checked due to patient safety  concerns  Vitals:   08/04/21 2240 08/05/21 0337 08/05/21 0803 08/05/21 1524  BP: (!) 105/40 (!) 123/53 (!) 112/43 (!) 139/59  Pulse: 68 73 68 81  Resp:  _0 Temp:   98.1 F (36.7 C) 97.6 F (36.4 C) 97.9 F (36.6 C)  TempSrc:  Oral Oral Oral  SpO2:  97% 98% 97%  Weight:        Intake/Output Summary (Last 24 hours) at 08/05/2021 1624 Last data filed at 08/05/2021 1300 Gross per 24 hour  Intake 840 ml  Output 1400 ml  Net -560 ml   Filed Weights   08/03/21 0637  Weight: 81.6 kg    Data Reviewed: I have personally reviewed and interpreted daily labs, tele strips, imagings as discussed above. I reviewed all nursing notes, pharmacy notes, vitals, pertinent old records I have discussed plan of care as described above with RN and patient/family.  CBC: Recent Labs  Lab 08/03/21 0642 08/03/21 1053 08/04/21 0631 08/04/21 1100 08/04/21 1905 08/04/21 2159 08/05/21 0510  WBC 12.7*   < > 11.7* 11.5* 10.3 10.2 8.9  NEUTROABS 9.0*  --   --   --   --   --   --   HGB 6.3*   < > 8.4* 9.0* 7.8* 7.7* 7.8*  HCT 19.4*   < > 26.0* 27.2* 23.4* 23.2* 24.0*  MCV 99.5   < > 93.2 92.8 93.6 92.8 93.4  PLT 293   < > 286 259 258 247 267   < > = values in this interval not displayed.   Basic Metabolic Panel: Recent Labs  Lab 08/03/21 0642 08/04/21 0631 08/05/21 0510  NA 138 138 136  K 4.2 4.4 3.9  CL 106 105 105  CO2 _1 GLUCOSE 153* 140* 121*  BUN 63* 54* 50*  CREATININE 2.28* 2.28* 2.37*  CALCIUM 8.8* 9.3 8.8*  MG  --   --  2.2    Studies: US RENAL  Result Date: 08/04/2021 CLINICAL DATA:  Kidney stone. EXAM: RENAL / URINARY TRACT ULTRASOUND COMPLETE COMPARISON:  CT of the abdomen and pelvis 08/03/2021 FINDINGS: Right Kidney: Renal measurements: 10.1 x 5.1 x 4.4 centimeters = volume: 117.1 mL. Echogenicity within normal limits. No mass or hydronephrosis visualized. Left Kidney: Renal measurements: 11.7 x 5.6 x 4.2 centimeter = volume: 141.5 mL. Small LOWER pole cyst is less than 1 centimeter. No solid mass. No hydronephrosis. Bladder: Bladder is distended. Patient was unable to void. Bladder volume is estimated to be 1177.4 ml. Other: None.  IMPRESSION: No acute abnormality. Distended urinary bladder.  Patient was unable to void. Electronically Signed   By: Nolon Nations M.D.   On: 08/04/2021 17:09    Scheduled Meds:  allopurinol  200 mg Oral Daily   apixaban  5 mg Oral BID   ascorbic acid  500 mg Oral Daily   atorvastatin  20 mg Oral QHS   cholecalciferol  5,000 Units Oral Daily   finasteride  5 mg Oral Daily   fluticasone furoate-vilanterol  1 puff Inhalation Daily   [START ON 08/08/2021] furosemide  40 mg Oral Daily   insulin aspart  0-5 Units Subcutaneous QHS   insulin aspart  0-9 Units Subcutaneous TID WC   insulin glargine-yfgn  15 Units Subcutaneous QHS   magnesium oxide  400 mg Oral TID   [START ON 08/08/2021] metoprolol tartrate  50 mg Oral BID   [START ON 08/06/2021] pantoprazole  40 mg Intravenous Q12H   Ensure Max  Protein  11 oz Oral BID   senna-docusate  2 tablet Oral BID   sildenafil  20 mg Oral TID   Treprostinil  18 mcg Inhalation QID   vitamin B-12  1,000 mcg Oral Daily   Continuous Infusions:  pantoprazole 8 mg/hr (08/04/21 2334)   PRN Meds: acetaminophen, albuterol, dextromethorphan-guaiFENesin, hydrALAZINE, morphine injection, ondansetron (ZOFRAN) IV  Time spent: 35 minutes  Author: Val Riles. MD [facility] 08/05/2021 4:24 PM  To reach On-call, see care teams to locate the attending and reach out to them via [email or web address]. If 7PM-7AM, please contact night-coverage If you still have difficulty reaching the attending provider, please page the [facility] (Director on Call) for [facility] on amion for assistance.

## 2021-08-06 LAB — BASIC METABOLIC PANEL
Anion gap: 8 (ref 5–15)
BUN: 44 mg/dL — ABNORMAL HIGH (ref 8–23)
CO2: 27 mmol/L (ref 22–32)
Calcium: 8.7 mg/dL — ABNORMAL LOW (ref 8.9–10.3)
Chloride: 102 mmol/L (ref 98–111)
Creatinine, Ser: 2.3 mg/dL — ABNORMAL HIGH (ref 0.61–1.24)
GFR, Estimated: 29 mL/min — ABNORMAL LOW (ref >60)
Glucose, Bld: 178 mg/dL — ABNORMAL HIGH (ref 70–99)
Potassium: 4 mmol/L (ref 3.5–5.1)
Sodium: 137 mmol/L (ref 135–145)

## 2021-08-06 LAB — GLUCOSE, CAPILLARY
Glucose-Capillary: 176 mg/dL — ABNORMAL HIGH (ref 70–99)
Glucose-Capillary: 239 mg/dL — ABNORMAL HIGH (ref 70–99)
Glucose-Capillary: 211 mg/dL — ABNORMAL HIGH (ref 70–99)
Glucose-Capillary: 212 mg/dL — ABNORMAL HIGH (ref 70–99)

## 2021-08-06 LAB — CBC
HCT: 22.6 % — ABNORMAL LOW (ref 39.0–52.0)
Hemoglobin: 7.3 g/dL — ABNORMAL LOW (ref 13.0–17.0)
MCH: 30.2 pg (ref 26.0–34.0)
MCHC: 32.3 g/dL (ref 30.0–36.0)
MCV: 93.4 fL (ref 80.0–100.0)
Platelets: 238 10*3/uL (ref 150–400)
RBC: 2.42 MIL/uL — ABNORMAL LOW (ref 4.22–5.81)
RDW: 17.5 % — ABNORMAL HIGH (ref 11.5–15.5)
WBC: 7 10*3/uL (ref 4.0–10.5)
nRBC: 0 % (ref 0.0–0.2)

## 2021-08-06 LAB — PHOSPHORUS: Phosphorus: 4.8 mg/dL — ABNORMAL HIGH (ref 2.5–4.6)

## 2021-08-06 LAB — PREPARE RBC (CROSSMATCH)

## 2021-08-06 LAB — MAGNESIUM: Magnesium: 2.1 mg/dL (ref 1.7–2.4)

## 2021-08-06 MED ORDER — SODIUM CHLORIDE 0.9% IV SOLUTION: Freq: Once | INTRAVENOUS | Status: AC

## 2021-08-06 NOTE — Progress Notes (Signed)
[facility] Progress Note  Patient: Jay Morris    EXH:[id number]  DOA: 08/03/2021     Date of Service: the patient was seen and examined on 08/06/2021  Chief Complaint  Patient presents with   Back Pain   Shortness of Breath   Weakness   Near Syncope   Brief hospital course: Tamari Busic is a 76 y.o. male with medical history significant of hypertension, hyperlipidemia, diabetes mellitus, COPD on 2-3 L oxygen, GERD, gout, pulmonary hypertension, CHF, atrial fibrillation on Eliquis, BPH with chronic urinary retention (doing self cath twice a day),, CKD-4, anemia, CAD, GI bleeding, PVD, MGUS, who presents with dark stool and hematemesis.  He received 2 units of PRBC for hemoglobin of 6.3.  CT abdomen/pelvis did not show any acute changes.  Showed significant constipation.    Assessment and Plan: Acute GI bleed. Acute blood loss anemia with anemia of chronic kidney disease. Hemoglobin is stable after transfusion.  No additional black stool today. Continue monitor hemoglobin and transfuse as needed. GI consulted s/p EGD, bleeding angiodysplasias s/p ACP, GI recommended to start Eliquis which was resumed on 1/18 Diet advanced to low residue Continue pantoprazole IV infusion for 72 hours, followed by PPI IV 40 mg every 12 hourly start on 1/19 Hb 7.3, transfused 1 unit of PRBC today  Constipation Scheduled senna.    Chronic hypoxic respiratory failure, patient is on oxygen 2 L at rest and 3 to 4 L while ambulation at baseline,  COPD, Stable. Continue supplemental O2 inhalation  Moderate to severe pulmonary hypertension. Chronic diastolic congestive heart failure. Essential hypertension. Patient currently does not have any volume overload.  Continue to follow. Blood pressure soft and creatinine is still elevated so held beta-blocker and Lasix for now Continue monitor BP and creatinine level and then resume medications when needed  Type 2 diabetes. Continue insulin  glargine and sliding scale insulin.     Chronic kidney disease stage IV. Continue to follow. Creatinine 2.37---2.3 Held Lasix for now  Benign prostate hypertrophy. Patient is able to self cath.   Continue Proscar 1/18 started Flomax  Back pain. renal ultrasound ruled out renal stone.   Body mass index is 27.35 kg/m.  Nutrition Problem: Inadequate oral intake Etiology: acute illness Interventions: Interventions: Ensure Enlive (each supplement provides 350kcal and 20 grams of protein)     Diet: Soft diet DVT Prophylaxis: SCD, pharmacological prophylaxis contraindicated due to GI bleeding    Advance goals of care discussion: Full code  Family Communication: family was present at bedside, at the time of interview.  The pt provided permission to discuss medical plan with the family. Opportunity was given to ask question and all questions were answered satisfactorily.   Disposition:  Pt is from Home, admitted with GI bleeding, still has low hemoglobin, which precludes a safe discharge. Discharge to home, when clinically stable, may require 1-2 more days.  Subjective: No significant events overnight, patient denies any GI bleeding, had no BM yet.  Denies any abdominal pain, no chest palpitation, no shortness of breath. Patient and patient's wife agreed for the blood transfusion today.   Physical Exam: General:  alert oriented to time, place, and person.  Appear in no distress, affect appropriate Eyes: PERRLA ENT: Oral Mucosa Clear, moist  Neck: no JVD,  Cardiovascular: S1 and S2 Present, no Murmur,  Respiratory: good respiratory effort, Bilateral Air entry equal and Decreased, no Crackles, no wheezes Abdomen: Bowel Sound present, Soft and no tenderness,  Skin: no rashes Extremities: no Pedal edema,  no calf tenderness Neurologic: without any new focal findings Gait not checked due to patient safety concerns  Vitals:   08/06/21 0842 08/06/21 1216 08/06/21 1414 08/06/21  1443  BP: 121/64  (!) 142/64 (!) 118/51  Pulse: 77  (!) 114 (!) 101  Resp: _0 Temp: 98.3 F (36.8 C)  97.9 F (36.6 C) 98.5 F (36.9 C)  TempSrc:   Oral Oral  SpO2: 99% 91% 96% 95%  Weight:        Intake/Output Summary (Last 24 hours) at 08/06/2021 1551 Last data filed at 08/06/2021 1040 Gross per 24 hour  Intake 240 ml  Output 1000 ml  Net -760 ml   Filed Weights   08/03/21 0637  Weight: 81.6 kg    Data Reviewed: I have personally reviewed and interpreted daily labs, tele strips, imagings as discussed above. I reviewed all nursing notes, pharmacy notes, vitals, pertinent old records I have discussed plan of care as described above with RN and patient/family.  CBC: Recent Labs  Lab 08/03/21 0642 08/03/21 1053 08/04/21 1100 08/04/21 1905 08/04/21 2159 08/05/21 0510 08/06/21 0525  WBC 12.7*   < > 11.5* 10.3 10.2 8.9 7.0  NEUTROABS 9.0*  --   --   --   --   --   --   HGB 6.3*   < > 9.0* 7.8* 7.7* 7.8* 7.3*  HCT 19.4*   < > 27.2* 23.4* 23.2* 24.0* 22.6*  MCV 99.5   < > 92.8 93.6 92.8 93.4 93.4  PLT 293   < > 259 258 247 267 238   < > = values in this interval not displayed.   Basic Metabolic Panel: Recent Labs  Lab 08/03/21 0642 08/04/21 0631 08/05/21 0510 08/06/21 0525  NA 138 138 136 137  K 4.2 4.4 3.9 4.0  CL 106 105 105 102  CO2 _1 GLUCOSE 153* 140* 121* 178*  BUN 63* 54* 50* 44*  CREATININE 2.28* 2.28* 2.37* 2.30*  CALCIUM 8.8* 9.3 8.8* 8.7*  MG  --   --  2.2 2.1  PHOS  --   --   --  4.8*    Studies: No results found.  Scheduled Meds:  sodium chloride   Intravenous Once   allopurinol  200 mg Oral Daily   apixaban  5 mg Oral BID   ascorbic acid  500 mg Oral Daily   atorvastatin  20 mg Oral QHS   cholecalciferol  5,000 Units Oral Daily   finasteride  5 mg Oral Daily   fluticasone furoate-vilanterol  1 puff Inhalation Daily   [START ON 08/08/2021] furosemide  40 mg Oral Daily   insulin aspart  0-5 Units Subcutaneous QHS    insulin aspart  0-9 Units Subcutaneous TID WC   insulin glargine-yfgn  15 Units Subcutaneous QHS   magnesium oxide  400 mg Oral TID   [START ON 08/08/2021] metoprolol tartrate  12.5 mg Oral BID   pantoprazole  40 mg Intravenous Q12H   Ensure Max Protein  11 oz Oral Daily   senna-docusate  2 tablet Oral BID   sildenafil  20 mg Oral TID   tamsulosin  0.4 mg Oral QPC supper   Treprostinil  18 mcg Inhalation QID   vitamin B-12  1,000 mcg Oral Daily   Continuous Infusions:   PRN Meds: acetaminophen, albuterol, dextromethorphan-guaiFENesin, hydrALAZINE, morphine injection, ondansetron (ZOFRAN) IV  Time spent: 35 minutes  Author: Val Riles. MD [facility] 08/06/2021 3:51 PM  To reach On-call, see care teams to locate the attending and reach out to them via [email or web address]. If 7PM-7AM, please contact night-coverage If you still have difficulty reaching the attending provider, please page the [facility] (Director on Call) for [facility] on amion for assistance.

## 2021-08-06 NOTE — Care Management Important Message (Signed)
Important Message  Patient Details  Name: Jay Morris MRN: [id number] Date of Birth: December 26, 1945   Medicare Important Message Given:  Yes     Dannette Barbara 08/06/2021, 2:52 PM

## 2021-08-07 LAB — TYPE AND SCREEN
ABO/RH(D): A POS
Antibody Screen: NEGATIVE
Unit division: 0
Unit division: 0
Unit division: 0

## 2021-08-07 LAB — BASIC METABOLIC PANEL
Anion gap: 8 (ref 5–15)
BUN: 38 mg/dL — ABNORMAL HIGH (ref 8–23)
CO2: 26 mmol/L (ref 22–32)
Calcium: 9.1 mg/dL (ref 8.9–10.3)
Chloride: 103 mmol/L (ref 98–111)
Creatinine, Ser: 2.12 mg/dL — ABNORMAL HIGH (ref 0.61–1.24)
GFR, Estimated: 31 mL/min — ABNORMAL LOW (ref >60)
Glucose, Bld: 202 mg/dL — ABNORMAL HIGH (ref 70–99)
Potassium: 3.9 mmol/L (ref 3.5–5.1)
Sodium: 137 mmol/L (ref 135–145)

## 2021-08-07 LAB — BPAM RBC
Blood Product Expiration Date: 202302062359
Blood Product Expiration Date: 202302092359
Blood Product Expiration Date: 202302092359
ISSUE DATE / TIME: 202301161110
ISSUE DATE / TIME: 202301161502
ISSUE DATE / TIME: 202301191421
Unit Type and Rh: 6200
Unit Type and Rh: 6200
Unit Type and Rh: 6200

## 2021-08-07 LAB — GLUCOSE, CAPILLARY
Glucose-Capillary: 176 mg/dL — ABNORMAL HIGH (ref 70–99)
Glucose-Capillary: 296 mg/dL — ABNORMAL HIGH (ref 70–99)

## 2021-08-07 LAB — CBC
HCT: 25.5 % — ABNORMAL LOW (ref 39.0–52.0)
Hemoglobin: 8.6 g/dL — ABNORMAL LOW (ref 13.0–17.0)
MCH: 30.7 pg (ref 26.0–34.0)
MCHC: 33.7 g/dL (ref 30.0–36.0)
MCV: 91.1 fL (ref 80.0–100.0)
Platelets: 259 10*3/uL (ref 150–400)
RBC: 2.8 MIL/uL — ABNORMAL LOW (ref 4.22–5.81)
RDW: 17 % — ABNORMAL HIGH (ref 11.5–15.5)
WBC: 7.1 10*3/uL (ref 4.0–10.5)
nRBC: 0.3 % — ABNORMAL HIGH (ref 0.0–0.2)

## 2021-08-07 LAB — PHOSPHORUS: Phosphorus: 3.9 mg/dL (ref 2.5–4.6)

## 2021-08-07 LAB — MAGNESIUM: Magnesium: 2.1 mg/dL (ref 1.7–2.4)

## 2021-08-07 MED ORDER — METOPROLOL TARTRATE 25 MG PO TABS
12.5000 mg | ORAL_TABLET | Freq: Two times a day (BID) | ORAL | Status: DC
  Administered 2021-08-07: 12.5 mg via ORAL
  Filled 2021-08-07: qty 1

## 2021-08-07 MED ORDER — METOPROLOL TARTRATE 25 MG PO TABS: 25.0000 mg | ORAL_TABLET | Freq: Two times a day (BID) | ORAL | Status: DC

## 2021-08-07 MED ORDER — METOPROLOL TARTRATE 25 MG PO TABS: 25.0000 mg | ORAL_TABLET | Freq: Two times a day (BID) | ORAL | 0 refills | Status: DC

## 2021-08-07 MED ORDER — TAMSULOSIN HCL 0.4 MG PO CAPS: 0.4000 mg | ORAL_CAPSULE | Freq: Every day | ORAL | 0 refills | Status: AC

## 2021-08-07 NOTE — TOC Initial Note (Signed)
Transition of Care [facility]) - Initial/Assessment Note    Patient Details  Name: Jay Morris MRN: [id number] Date of Birth: 1945/11/01  [facility] CM/SW Contact:    Eileen Stanford, LCSW Phone Number: 08/07/2021, 3:06 PM  Clinical Narrative:   Pt's spouse present at bedside. Pt's spouse states she takes him to his appointments. Pt's spouse states he still sees Dr Doy Hutching for primary care. Pt states [facility]. Pt is currently not getting any services at home. Pt's spouse declined any needs at this time.                Expected Discharge Plan: Home/Self Care Barriers to Discharge: No Barriers Identified   Patient Goals and CMS Choice Patient states their goals for this hospitalization and ongoing recovery are:: to go home   Choice offered to / list presented to : Patient  Expected Discharge Plan and Services Expected Discharge Plan: Home/Self Care In-house Referral: NA   Post Acute Care Choice: NA Living arrangements for the past 2 months: Single Family Home Expected Discharge Date: 08/07/21                                    Prior Living Arrangements/Services Living arrangements for the past 2 months: Single Family Home Lives with:: Spouse Patient language and need for interpreter reviewed:: Yes Do you feel safe going back to the place where you live?: Yes      Need for Family Participation in Patient Care: Yes (Comment) Care giver support system in place?: Yes (comment)   Criminal Activity/Legal Involvement Pertinent to Current Situation/Hospitalization: No - Comment as needed  Activities of Daily Living Home Assistive Devices/Equipment: Walker (specify type) ADL Screening (condition at time of admission) Patient's cognitive ability adequate to safely complete daily activities?: Yes Is the patient deaf or have difficulty hearing?: Yes Does the patient have difficulty seeing, even when wearing glasses/contacts?: No Does the  patient have difficulty concentrating, remembering, or making decisions?: No Patient able to express need for assistance with ADLs?: No Does the patient have difficulty dressing or bathing?: No Independently performs ADLs?: Yes (appropriate for developmental age) Does the patient have difficulty walking or climbing stairs?: Yes Weakness of Legs: Both Weakness of Arms/Hands: None  Permission Sought/Granted Permission sought to share information with : Family Supports Permission granted to share information with : Yes, Verbal Permission Granted  Share Information with NAME: Peter Congo     Permission granted to share info w Relationship: spouse     Emotional Assessment Appearance:: Appears stated age Attitude/Demeanor/Rapport: Engaged Affect (typically observed): Accepting Orientation: : Oriented to Self, Oriented to Place, Oriented to  Time, Oriented to Situation Alcohol / Substance Use: Not Applicable Psych Involvement: No (comment)  Admission diagnosis:  Hematemesis [K92.0] Blood loss anemia [D50.0] Upper GI bleeding [K92.2] Upper GI bleed [K92.2] Anticoagulated [Z79.01] Patient Active Problem List   Diagnosis Date Noted   GI bleeding 08/03/2021   Chronic diastolic CHF (congestive heart failure) ([place]) 08/03/2021   HTN (hypertension) 08/03/2021   Atrial fibrillation, chronic ([place]) 08/03/2021   CKD (chronic kidney disease), stage IV ([place]) 08/03/2021   CAD (coronary artery disease) 08/03/2021   Leukocytosis 08/03/2021   Gout 01/08/2021   Hearing loss 01/08/2021   History of severe acute respiratory syndrome coronavirus 2 (SARS-CoV-2) disease 01/08/2021   Vitamin D deficiency 01/08/2021   Benign prostatic hyperplasia 12/31/2020   Anemia in  chronic kidney disease 12/31/2020   Heart failure, unspecified ([place]) 12/31/2020   CKD (chronic kidney disease) stage 3, GFR 30-59 ml/min (HCC) 01/28/2020   Acute on chronic systolic heart failure ([place]) 01/16/2020   Pulmonary hypertension,  moderate to severe ([place]) 11/26/2019   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/17/2019   Anemia, unspecified 09/01/2019   Acute on chronic diastolic CHF (congestive heart failure), NYHA class 3 ([place]) 08/16/2019   PVD (peripheral vascular disease) ([place]) 06/03/2019   Hepatic steatosis 06/03/2019   Acute renal failure superimposed on stage 3a chronic kidney disease ([place]) 06/01/2019   COPD with acute exacerbation ([place]) 06/01/2019   Muscle weakness (generalized) 06/01/2019   COVID-19 virus infection 06/01/2019   AKI (acute kidney injury) ([place]) 05/31/2019   Acute respiratory failure with hypoxia ([place])    Vitamin B12 deficiency 05/08/2018   Type II diabetes mellitus with renal manifestations ([place]) 02/08/2018   At risk for bleeding 12/15/2017   Hypotension 12/01/2017   Iron deficiency anemia due to chronic blood loss    GI bleed 11/21/2017   Coronary artery disease involving native coronary artery of native heart 11/16/2017   Uncontrolled type 2 diabetes mellitus with hyperglycemia, with long-term current use of insulin ([place]) 11/24/2016   Chronic gouty arthritis 04/16/2016   COPD, moderate ([place]) 02/09/2016   Pneumonia 01/04/2016   Degeneration of lumbar intervertebral disc 10/28/2015   Paroxysmal atrial fibrillation with RVR ([place]) 05/21/2014   Hyperlipidemia 05/21/2014   PCP:  [name], MD Pharmacy:   [place], [place] - [address] [place] [address] Phone: [phone] Fax: [phone]     Social Determinants of Health (SDOH) Interventions    Readmission Risk Interventions Readmission Risk Prevention Plan 08/07/2021  Transportation Screening Complete  PCP or Specialist Appt within 3-5 Days Complete  HRI or Home Care Consult Complete  Social Work Consult for [place] Planning/Counseling Complete  Palliative Care Screening Not Applicable  Medication Review Press photographer) Complete  Some recent data might  be hidden

## 2021-08-07 NOTE — Progress Notes (Signed)
Inpatient Diabetes Program Recommendations  AACE/ADA: New Consensus Statement on Inpatient Glycemic Control (2015)  Target Ranges:  Prepandial:   less than 140 mg/dL      Peak postprandial:   less than 180 mg/dL (1-2 hours)      Critically ill patients:  140 - 180 mg/dL    Latest Reference Range & Units 08/06/21 08:11 08/06/21 11:42 08/06/21 16:47 08/06/21 20:19  Glucose-Capillary 70 - 99 mg/dL 176 (H)  2 units Novolog  239 (H)  3 units Novolog  211 (H)  3 units Novolog  212 (H)  2 units Novolog  15 units Semglee    Latest Reference Range & Units 08/07/21 07:43 08/07/21 11:46  Glucose-Capillary 70 - 99 mg/dL 176 (H)  2 units Novolog _0  296 (H)  (H): Data is abnormally high    Admit with:  Acute GI bleed Acute blood loss anemia with anemia of chronic kidney disease  History: DM2, CKD, CHF  Home DM Meds: Novolog 20 units AM/ 26 units Noon/ 26 units PM       Tresiba 46 units QHS  Current Orders: Novolog Sensitive Correction Scale/ SSI (0-9 units) TID AC + HS     Semglee 15 units QHS    MD- Note afternoon CBGs quite elevated.  Pt takes set doses of Novolog at home.  Please consider starting 1/3 total home dose of Novolog:  Novolog 6 units with Breakfast Novolog 8 units with Lunch Novolog 8 units with Dinner    --Will follow patient during hospitalization--  Wyn Quaker RN, MSN, CDE Diabetes Coordinator Inpatient Glycemic Control Team Team Pager: [phone] (8a-5p)

## 2021-08-07 NOTE — Progress Notes (Signed)
Pt discharged per MD order. IV removed. Discharge instructions reviewed with pt. Pt verbalized understanding. All questions answered to pt satisfaction. Pt taken downstairs via wheelchair.

## 2021-08-07 NOTE — Discharge Summary (Signed)
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)   Type II diabetes mellitus with renal manifestations ([place])   Benign prostatic hyperplasia   Gout   Anemia in chronic kidney disease   Chronic diastolic CHF (congestive heart failure) (HCC)   HTN (hypertension)   Atrial fibrillation, chronic (HCC)   CKD (chronic kidney disease), stage IV (HCC)   CAD (coronary artery disease)   Leukocytosis   Admitted From: Home Disposition:  Home   Recommendations for Outpatient Follow-up:  PCP: in 1 wk GI after 1 to 2 weeks Follow up LABS/TEST: CBC and BMP after 1 week   Diet recommendation: Cardiac diet  Activity: The patient is advised to gradually reintroduce usual activities, as tolerated  Discharge Condition: stable  Code Status: Full code   History of present illness: As per the H and P dictated on admission Hospital Course:  Jay Morris is a 76 y.o. male with medical history significant of hypertension, hyperlipidemia, diabetes mellitus, COPD on 2-3 L oxygen, GERD, gout, pulmonary hypertension, CHF, atrial fibrillation on Eliquis, BPH with chronic urinary retention (doing self cath twice a day),, CKD-4, anemia, CAD, GI bleeding, PVD, MGUS, who presents with dark stool and hematemesis.  He received 2 units of PRBC for hemoglobin of 6.3.  CT abdomen/pelvis did not show any acute changes.  Showed significant constipation.  GI was consulted and patient was admitted for further management as below.   Assessment and Plan: Acute GI bleed, Acute blood loss anemia with anemia of chronic kidney disease. Hemoglobin is stable after transfusion.  No additional black stool today.  GI consulted s/p EGD,  bleeding angiodysplasias s/p ACP, GI recommended to start Eliquis which was resumed on 1/18. Diet advanced to low residue/soft diet/ S/p pantoprazole IV infusion for 72 hours, followed by PPI IV 40 mg every 12 hourly start on 1/19, transition to oral PPI on discharge. Hb 7.3, transfused 1 unit of PRBC on 1/19, hemoglobin 8.6 on the day of discharge.  Patient denied any GI bleeding after starting Eliquis. Constipation, continued senna. Chronic hypoxic respiratory failure, patient is on oxygen 2 L at rest and 3 to 4 L while ambulation at baseline, COPD, Stable. Continue supplemental O2 inhalation Moderate to severe pulmonary hypertension. Chronic diastolic congestive heart failure. Essential hypertension. Patient currently does not have any volume overload.  Blood pressure was soft and creatinine was elevated so home medications were held.  BP improved, home medications resumed on discharge.  Patient was advised to monitor BP at home and follow with PCP for further management. Type 2 diabetes, resumed home medications, monitor FSBG at home and continue diabetic diet. Chronic kidney disease stage IV, Creatinine 2.37---2.3--2.12, gradually improving, Lasix was held, resumed on discharge.  Patient was advised to follow with PCP and repeat BMP after 1 week. Benign prostate hypertrophy, Patient is able to self cath. Continue Proscar 1/18 started Flomax, follow-up with a urologist as an outpatient Back pain, renal ultrasound ruled out renal stone.  Back pain resolved.     Body mass index is 28.8 kg/m.  Nutrition Problem: Inadequate oral intake Etiology: acute illness Nutrition Interventions: Interventions: Ensure Enlive (each supplement provides 350kcal and 20 grams of protein)   Patient was ambulatory  without any assistance. On the day of the discharge the patient's vitals were stable, and no other acute medical condition were reported by patient. the patient was felt safe to be discharge at  Home.  Consultants: GI Procedures: s/p EGD bleeding angiodysplasias s/p ACP  Discharge Exam: General: Appear in no distress, no Rash; Oral Mucosa Clear, moist. Cardiovascular: S1 and S2 Present, no Murmur, Respiratory: normal respiratory effort, Bilateral Air entry present and no Crackles, no wheezes Abdomen: Bowel Sound present, Soft and no tenderness, no hernia Extremities: no Pedal edema, no calf tenderness Neurology: alert and oriented to time, place, and person affect appropriate.  Filed Weights   08/03/21 0637 08/06/21 1706 08/07/21 0500  Weight: 81.6 kg 82.6 kg 83.4 kg   Vitals:   08/07/21 0741 08/07/21 1219  BP: (!) 113/58 (!) 145/59  Pulse: 79 (!) 103  Resp: 20   Temp: 98.2 F (36.8 C)   SpO2: 98% 96%    DISCHARGE MEDICATION: Allergies as of 08/07/2021       Reactions   Shrimp [shellfish Allergy] Anaphylaxis   Shrimp Extract Allergy Skin Test         Medication List     STOP taking these medications    amLODipine 5 MG tablet Commonly known as: NORVASC       TAKE these medications    acetaminophen 500 MG tablet Commonly known as: TYLENOL Take 1 tablet (500 mg total) by mouth every 6 (six) hours as needed for moderate pain or headache. What changed: how much to take   albuterol 108 (90 Base) MCG/ACT inhaler Commonly known as: VENTOLIN HFA Inhale 2 puffs into the lungs daily.   allopurinol 100 MG tablet Commonly known as: ZYLOPRIM Take 2 tablets by mouth daily. What changed: Another medication with the same name was removed. Continue taking this medication, and follow the directions you see here.   ascorbic acid 500 MG tablet Commonly known as: VITAMIN C Take 500 mg by mouth daily.   atorvastatin 20 MG tablet Commonly known as: LIPITOR Take 20 mg by mouth at bedtime.   Breo Ellipta 100-25 MCG/ACT Aepb Generic drug: fluticasone furoate-vilanterol Inhale 1 puff into the lungs daily.   colchicine 0.6 MG tablet Take 0.6 mg by mouth daily  as needed (gout flare).   Dialyvite Vitamin D 5000 125 MCG (5000 UT) capsule Generic drug: Cholecalciferol Take 5,000 Units by mouth daily.   Eliquis 5 MG Tabs tablet Generic drug: apixaban SMARTSIG:1 Tablet(s) By Mouth Every 12 Hours   finasteride 5 MG tablet Commonly known as: PROSCAR Take 1 tablet (5 mg total) by mouth daily.   furosemide 80 MG tablet Commonly known as: LASIX Take 40 mg by mouth daily.   insulin aspart 100 UNIT/ML FlexPen Commonly known as: NOVOLOG Inject 20-26 Units into the skin 3 (three) times daily with meals. 20 units am 26 units noon, 26 units evening   lisinopril 20 MG tablet Commonly known as: ZESTRIL Take 10 mg by mouth at bedtime.   magnesium oxide 400 MG tablet Commonly known as: MAG-OX Take 1 tablet by mouth 3 (three) times daily.   metoprolol tartrate 25 MG tablet Commonly known as: LOPRESSOR Take 1 tablet (25 mg total) by mouth 2 (two) times daily. What changed: how much to take   pantoprazole 40 MG tablet Commonly known as: PROTONIX Take 1 tablet (40 mg total) by mouth 2 (two) times daily before a meal.   sildenafil 20 MG tablet Commonly known as: REVATIO Take 20 mg by  mouth 3 (three) times daily.   sodium chloride 0.65 % Soln nasal spray Commonly known as: OCEAN Place 1 spray into both nostrils as needed for congestion.   tamsulosin 0.4 MG Caps capsule Commonly known as: FLOMAX Take 1 capsule (0.4 mg total) by mouth daily after supper.   Tyler Aas FlexTouch 200 UNIT/ML FlexTouch Pen Generic drug: insulin degludec Inject 46 Units into the skin at bedtime.   Tyvaso Refill 0.6 MG/ML Soln Generic drug: Treprostinil Inhale 18 mcg into the lungs in the morning, at noon, in the evening, and at bedtime. What changed: how much to take   vitamin B-12 1000 MCG tablet Commonly known as: CYANOCOBALAMIN Take 1,000 mcg by mouth daily.       Allergies  Allergen Reactions   Shrimp [Shellfish Allergy] Anaphylaxis   Shrimp Extract  Allergy Skin Test    Discharge Instructions     Call MD for:  difficulty breathing, headache or visual disturbances   Complete by: As directed    Call MD for:  extreme fatigue   Complete by: As directed    Call MD for:  persistant dizziness or light-headedness   Complete by: As directed    Call MD for:  persistant nausea and vomiting   Complete by: As directed    Call MD for:  severe uncontrolled pain   Complete by: As directed    Call MD for:  temperature >100.4   Complete by: As directed    Diet - low sodium heart healthy   Complete by: As directed    Discharge instructions   Complete by: As directed    Follow-up with PCP in 1 week, continue to monitor BP at home and follow with PCP to titrate medications accordingly. Patient was started on Flomax, decreased dose of Lopressor due to risk of hypotension.  H&H remained stable, resumed Eliquis as per GI, repeat CBC and BMP next week. Follow-up with GI in few weeks as an outpatient Follow-up with hematology as an outpatient for anemia and history of MGUS Follow with nephrology as an outpatient for CKD stage IV   Increase activity slowly   Complete by: As directed        The results of significant diagnostics from this hospitalization (including imaging, microbiology, ancillary and laboratory) are listed below for reference.    Significant Diagnostic Studies: CT ABDOMEN PELVIS WO CONTRAST  Result Date: 08/03/2021 CLINICAL DATA:  Acute non localized abdominal pain. Yesterday dizziness and lightheadedness. Vomited blood. EXAM: CT ABDOMEN AND PELVIS WITHOUT CONTRAST TECHNIQUE: Multidetector CT imaging of the abdomen and pelvis was performed following the standard protocol without IV contrast. RADIATION DOSE REDUCTION: This exam was performed according to the departmental dose-optimization program which includes automated exposure control, adjustment of the mA and/or kV according to patient size and/or use of iterative reconstruction  technique. COMPARISON:  Renal ultrasound 01/29/2021, CT abdomen and pelvis without contrast 06/17/2019 FINDINGS: Lower chest: There is again interlobular septal thickening within the lung bases. Interval mildly improved aeration with persistent predominantly peripheral scarring, raising the question of chronic interstitial lung disease. Heart size is again mildly enlarged. Trace pericardial fluid. Dense coronary artery calcifications. Aortic root calcification. Lack of intra-articular fluid limits evaluation of the abdominal and pelvic organ parenchyma. The following findings are made within this limitation. Hepatobiliary: A subcentimeter low-density focus within the right hepatic lobe seen on 06/17/2019 noncontrast CT is less well visualized and appears decreased in size (axial image 18). This again suggests a benign process. The gallbladder is grossly  unremarkable. Pancreas: Unremarkable. No pancreatic ductal dilatation or surrounding inflammatory changes. Spleen: Normal in size without focal abnormality. Adrenals/Urinary Tract: Normal adrenals. Stable chronic bilateral perinephric fat stranding. No renal stone or hydronephrosis. Within the limitations of lack of IV contrast, no contour deforming renal mass. The urinary bladder is grossly unremarkable. Stomach/Bowel: Moderate to high-grade stool throughout all segments of the colon compatible with constipation. No bowel wall thickening. The terminal ileum is unremarkable. The appendix is within normal limits. No dilated loops of bowel to indicate bowel obstruction. Vascular/Lymphatic: No abdominal aortic aneurysm. High-grade atherosclerotic calcifications are similar to prior. No enlarged abdominal or pelvic lymph nodes. Reproductive: Prostate is unremarkable. Other: No abdominal wall hernia. No free air or free fluid. Musculoskeletal: Mild levocurvature of the lower lumbar spine. Moderate to severe multilevel degenerative disc changes with large anterior L2-3  through L4-5 endplate osteophytes. IMPRESSION:: IMPRESSION: 1. Moderate to high-grade stool throughout all segments of the colon compatible with constipation. 2. No acute abnormality is seen within the abdomen or pelvis. 3. Likely moderate chronic scarring within the lung bases. 4. Mild cardiomegaly.  Aortic Atherosclerosis (ICD10-I70.0). Electronically Signed   By: Yvonne Kendall M.D.   On: 08/03/2021 09:22   DG Chest 2 View  Result Date: 08/03/2021 CLINICAL DATA:  Shortness of breath and weakness. EXAM: CHEST - 2 VIEW COMPARISON:  10/03/2019 FINDINGS: Stable cardiomediastinal contours. Lung volumes are low. Diffuse coarsened interstitial markings are identified bilaterally reflecting a combination of COPD/emphysema and pulmonary fibrosis. No superimposed airspace consolidation. IMPRESSION: 1. Advanced chronic interstitial lung disease without acute superimposed findings. Electronically Signed   By: Kerby Moors M.D.   On: 08/03/2021 07:46   US RENAL  Result Date: 08/04/2021 CLINICAL DATA:  Kidney stone. EXAM: RENAL / URINARY TRACT ULTRASOUND COMPLETE COMPARISON:  CT of the abdomen and pelvis 08/03/2021 FINDINGS: Right Kidney: Renal measurements: 10.1 x 5.1 x 4.4 centimeters = volume: 117.1 mL. Echogenicity within normal limits. No mass or hydronephrosis visualized. Left Kidney: Renal measurements: 11.7 x 5.6 x 4.2 centimeter = volume: 141.5 mL. Small LOWER pole cyst is less than 1 centimeter. No solid mass. No hydronephrosis. Bladder: Bladder is distended. Patient was unable to void. Bladder volume is estimated to be 1177.4 ml. Other: None. IMPRESSION: No acute abnormality. Distended urinary bladder.  Patient was unable to void. Electronically Signed   By: Nolon Nations M.D.   On: 08/04/2021 17:09    Microbiology: Recent Results (from the past 240 hour(s))  Resp Panel by RT-PCR (Flu A&B, Covid) Nasopharyngeal Swab     Status: None   Collection Time: 08/03/21  6:42 AM   Specimen: Nasopharyngeal  Swab; Nasopharyngeal(NP) swabs in vial transport medium  Result Value Ref Range Status   SARS Coronavirus 2 by RT PCR NEGATIVE NEGATIVE Final    Comment: (NOTE) SARS-CoV-2 target nucleic acids are NOT DETECTED.  The SARS-CoV-2 RNA is generally detectable in upper respiratory specimens during the acute phase of infection. The lowest concentration of SARS-CoV-2 viral copies this assay can detect is 138 copies/mL. A negative result does not preclude SARS-Cov-2 infection and should not be used as the sole basis for treatment or other patient management decisions. A negative result may occur with  improper specimen collection/handling, submission of specimen other than nasopharyngeal swab, presence of viral mutation(s) within the areas targeted by this assay, and inadequate number of viral copies(<138 copies/mL). A negative result must be combined with clinical observations, patient history, and epidemiological information. The expected result is Negative.  Fact Sheet for Patients:  EntrepreneurPulse.com.au  Fact Sheet for Healthcare Providers:  IncredibleEmployment.be  This test is no t yet approved or cleared by the [place] FDA and  has been authorized for detection and/or diagnosis of SARS-CoV-2 by FDA under an Emergency Use Authorization (EUA). This EUA will remain  in effect (meaning this test can be used) for the duration of the COVID-19 declaration under Section 564(b)(1) of the Act, 21 U.S.C.section 360bbb-3(b)(1), unless the authorization is terminated  or revoked sooner.       Influenza A by PCR NEGATIVE NEGATIVE Final   Influenza B by PCR NEGATIVE NEGATIVE Final    Comment: (NOTE) The Xpert Xpress SARS-CoV-2/FLU/RSV plus assay is intended as an aid in the diagnosis of influenza from Nasopharyngeal swab specimens and should not be used as a sole basis for treatment. Nasal washings and aspirates are unacceptable for Xpert Xpress  SARS-CoV-2/FLU/RSV testing.  Fact Sheet for Patients: EntrepreneurPulse.com.au  Fact Sheet for Healthcare Providers: IncredibleEmployment.be  This test is not yet approved or cleared by the [place] FDA and has been authorized for detection and/or diagnosis of SARS-CoV-2 by FDA under an Emergency Use Authorization (EUA). This EUA will remain in effect (meaning this test can be used) for the duration of the COVID-19 declaration under Section 564(b)(1) of the Act, 21 U.S.C. section 360bbb-3(b)(1), unless the authorization is terminated or revoked.  Performed at [facility], [address] [place], [place] [address]   Blood culture (routine x 2)     Status: None (Preliminary result)   Collection Time: 08/03/21  8:32 AM   Specimen: BLOOD  Result Value Ref Range Status   Specimen Description BLOOD RIGHT ARM  Final   Special Requests   Final    BOTTLES DRAWN AEROBIC AND ANAEROBIC Blood Culture results may not be optimal due to an excessive volume of blood received in culture bottles   Culture   Final    NO GROWTH 4 DAYS Performed at [facility], [address] [place], [place] [address]    Report Status PENDING  Incomplete  Blood culture (routine x 2)     Status: None (Preliminary result)   Collection Time: 08/03/21  8:32 AM   Specimen: BLOOD  Result Value Ref Range Status   Specimen Description BLOOD RIGHT [facility]  Final   Special Requests   Final    BOTTLES DRAWN AEROBIC AND ANAEROBIC Blood Culture results may not be optimal due to an excessive volume of blood received in culture bottles   Culture   Final    NO GROWTH 4 DAYS Performed at [facility], [address] [place], [place] [address]    Report Status PENDING  Incomplete     Labs: CBC: Recent Labs  Lab 08/03/21 0642 08/03/21 1053 08/04/21 1905 08/04/21 2159 08/05/21 0510 08/06/21 0525 08/07/21 0523  WBC 12.7*   < > 10.3 10.2 8.9 7.0 7.1   NEUTROABS 9.0*  --   --   --   --   --   --   HGB 6.3*   < > 7.8* 7.7* 7.8* 7.3* 8.6*  HCT 19.4*   < > 23.4* 23.2* 24.0* 22.6* 25.5*  MCV 99.5   < > 93.6 92.8 93.4 93.4 91.1  PLT 293   < > 258 247 267 238 259   < > = values in this interval not displayed.   Basic Metabolic Panel: Recent Labs  Lab 08/03/21 0642 08/04/21 0631 08/05/21 0510 08/06/21 0525 08/07/21 0523  NA 138 138  136 137 137  K 4.2 4.4 3.9 4.0 3.9  CL 106 105 105 102 103  CO2 _0 GLUCOSE 153* 140* 121* 178* 202*  BUN 63* 54* 50* 44* 38*  CREATININE 2.28* 2.28* 2.37* 2.30* 2.12*  CALCIUM 8.8* 9.3 8.8* 8.7* 9.1  MG  --   --  2.2 2.1 2.1  PHOS  --   --   --  4.8* 3.9   Liver Function Tests: Recent Labs  Lab 08/03/21 0832  AST 12*  ALT 13  ALKPHOS 26*  BILITOT 0.5  PROT 6.1*  ALBUMIN 3.5   No results for input(s): LIPASE, AMYLASE in the last 168 hours. No results for input(s): AMMONIA in the last 168 hours. Cardiac Enzymes: No results for input(s): CKTOTAL, CKMB, CKMBINDEX, TROPONINI in the last 168 hours. BNP (last 3 results) Recent Labs    03/04/21 1515 08/03/21 0831  BNP 151.2* 63.5   CBG: Recent Labs  Lab 08/06/21 1142 08/06/21 1647 08/06/21 2019 08/07/21 0743 08/07/21 1146  GLUCAP 239* 211* 212* 176* 296*    Time spent: 35 minutes  Signed:  Val Riles  [facility]  08/07/2021 2:16 PM

## 2021-08-08 ENCOUNTER — Other Ambulatory Visit: Payer: Self-pay | Admitting: Physician Assistant

## 2021-08-08 DIAGNOSIS — N401 Enlarged prostate with lower urinary tract symptoms: Secondary | ICD-10-CM

## 2021-08-08 LAB — CULTURE, BLOOD (ROUTINE X 2)
Culture: NO GROWTH
Culture: NO GROWTH

## 2021-08-10 NOTE — Anesthesia Postprocedure Evaluation (Signed)
Anesthesia Post Note  Patient: Jay Morris  Procedure(s) Performed: ESOPHAGOGASTRODUODENOSCOPY (EGD) WITH PROPOFOL  Patient location during evaluation: Endoscopy Anesthesia Type: General Level of consciousness: awake and alert Pain management: pain level controlled Vital Signs Assessment: post-procedure vital signs reviewed and stable Respiratory status: spontaneous breathing, nonlabored ventilation, respiratory function stable and patient connected to nasal cannula oxygen Cardiovascular status: blood pressure returned to baseline and stable Postop Assessment: no apparent nausea or vomiting Anesthetic complications: no   No notable events documented.   Last Vitals:  Vitals:   08/07/21 1431 08/07/21 1455  BP: 128/69 (!) 144/59  Pulse: (!) 128 89  Resp: 20 18  Temp: 36.7 C (!) 36.4 C  SpO2: 96% 99%    Last Pain:  Vitals:   08/07/21 1455  TempSrc: Oral  PainSc:                  Martha Clan

## 2021-08-11 ENCOUNTER — Other Ambulatory Visit: Payer: Self-pay | Admitting: *Deleted

## 2021-08-11 DIAGNOSIS — D649 Anemia, unspecified: Secondary | ICD-10-CM

## 2021-08-17 ENCOUNTER — Inpatient Hospital Stay: Payer: Medicare HMO | Attending: Oncology

## 2021-08-17 ENCOUNTER — Other Ambulatory Visit: Payer: Self-pay

## 2021-08-17 ENCOUNTER — Inpatient Hospital Stay: Payer: Medicare HMO

## 2021-08-17 VITALS — BP 106/54 | HR 75

## 2021-08-17 DIAGNOSIS — D649 Anemia, unspecified: Secondary | ICD-10-CM | POA: Insufficient documentation

## 2021-08-17 DIAGNOSIS — N189 Chronic kidney disease, unspecified: Secondary | ICD-10-CM | POA: Diagnosis present

## 2021-08-17 DIAGNOSIS — D472 Monoclonal gammopathy: Secondary | ICD-10-CM

## 2021-08-17 LAB — CBC WITH DIFFERENTIAL/PLATELET
Abs Immature Granulocytes: 0.05 K/uL (ref 0.00–0.07)
Basophils Absolute: 0 K/uL (ref 0.0–0.1)
Basophils Relative: 0 %
Eosinophils Absolute: 0.3 K/uL (ref 0.0–0.5)
Eosinophils Relative: 2 %
HCT: 28.5 % — ABNORMAL LOW (ref 39.0–52.0)
Hemoglobin: 9.3 g/dL — ABNORMAL LOW (ref 13.0–17.0)
Immature Granulocytes: 0 %
Lymphocytes Relative: 10 %
Lymphs Abs: 1.2 K/uL (ref 0.7–4.0)
MCH: 30.9 pg (ref 26.0–34.0)
MCHC: 32.6 g/dL (ref 30.0–36.0)
MCV: 94.7 fL (ref 80.0–100.0)
Monocytes Absolute: 0.8 K/uL (ref 0.1–1.0)
Monocytes Relative: 7 %
Neutro Abs: 9.5 K/uL — ABNORMAL HIGH (ref 1.7–7.7)
Neutrophils Relative %: 81 %
Platelets: 249 K/uL (ref 150–400)
RBC: 3.01 MIL/uL — ABNORMAL LOW (ref 4.22–5.81)
RDW: 16.6 % — ABNORMAL HIGH (ref 11.5–15.5)
WBC: 11.7 K/uL — ABNORMAL HIGH (ref 4.0–10.5)
nRBC: 0 % (ref 0.0–0.2)

## 2021-08-17 LAB — FERRITIN: Ferritin: 298 ng/mL (ref 24–336)

## 2021-08-17 LAB — IRON AND TIBC
Iron: 61 ug/dL (ref 45–182)
Saturation Ratios: 18 % (ref 17.9–39.5)
TIBC: 332 ug/dL (ref 250–450)
UIBC: 271 ug/dL

## 2021-08-17 MED ORDER — EPOETIN ALFA-EPBX 40000 UNIT/ML IJ SOLN
40000.0000 [IU] | Freq: Once | INTRAMUSCULAR | Status: AC
  Administered 2021-08-17: 40000 [IU] via SUBCUTANEOUS
  Filled 2021-08-17: qty 1

## 2021-09-02 ENCOUNTER — Other Ambulatory Visit
Admission: RE | Admit: 2021-09-02 | Discharge: 2021-09-02 | Disposition: A | Discharge status: Home / Self Care | Payer: Medicare HMO | Source: Ambulatory Visit | Attending: Specialist | Admitting: Specialist

## 2021-09-02 DIAGNOSIS — Z9981 Dependence on supplemental oxygen: Secondary | ICD-10-CM | POA: Insufficient documentation

## 2021-09-02 DIAGNOSIS — J439 Emphysema, unspecified: Secondary | ICD-10-CM | POA: Insufficient documentation

## 2021-09-02 DIAGNOSIS — I272 Pulmonary hypertension, unspecified: Secondary | ICD-10-CM | POA: Insufficient documentation

## 2021-09-02 DIAGNOSIS — R0609 Other forms of dyspnea: Secondary | ICD-10-CM | POA: Diagnosis present

## 2021-09-02 LAB — BRAIN NATRIURETIC PEPTIDE: B Natriuretic Peptide: 154.9 pg/mL — ABNORMAL HIGH (ref 0.0–100.0)

## 2021-09-09 ENCOUNTER — Other Ambulatory Visit: Payer: Self-pay | Admitting: *Deleted

## 2021-09-09 DIAGNOSIS — D649 Anemia, unspecified: Secondary | ICD-10-CM

## 2021-09-11 NOTE — Progress Notes (Signed)
[place]  [phone] Fax:(336) [phone]  ID: Jay Morris OB: 02/06/1946  MR#: [id number]  PYP#:[id number]  Patient Care Team: [name], MD as PCP - General (Internal Medicine)  CHIEF COMPLAINT: Anemia, unspecified.  INTERVAL HISTORY: Patient returns to clinic today for further evaluation and consideration of additional Retacrit.  He was admitted to the hospital approximately 1 month ago with a GI bleed requiring several units of packed red blood cells.  He currently feels well and back to his baseline.  He has no neurologic complaints.  He denies any recent fevers or illnesses.  He has a good appetite and denies weight loss.  He has chronic shortness of breath requiring oxygen, but denies any chest pain, cough, or hemoptysis.  He denies any nausea, vomiting, constipation, or diarrhea.  He has no melena or hematochezia.  He has no urinary complaints.  Patient offers no further specific complaints today.  REVIEW OF SYSTEMS:   Review of Systems  Constitutional: Negative.  Negative for fever, malaise/fatigue and weight loss.  Respiratory:  Positive for shortness of breath. Negative for cough and hemoptysis.   Cardiovascular: Negative.  Negative for chest pain and leg swelling.  Gastrointestinal: Negative.  Negative for abdominal pain, blood in stool and melena.  Genitourinary: Negative.  Negative for hematuria.  Musculoskeletal: Negative.  Negative for back pain.  Skin: Negative.  Negative for rash.  Neurological: Negative.  Negative for dizziness, focal weakness, weakness and headaches.  Psychiatric/Behavioral: Negative.  The patient is not nervous/anxious.    As per HPI. Otherwise, a complete review of systems is negative.  PAST MEDICAL HISTORY: Past Medical History:  Diagnosis Date   Asthma    Atrial fibrillation (HCC)    BPH (benign prostatic hyperplasia)    COPD (chronic obstructive pulmonary disease) ([place])    Diabetes mellitus without  complication (HCC)    Helicobacter pylori gastritis    Hyperlipidemia    Hypertension    Pneumonia    Right heart failure ([place])     PAST SURGICAL HISTORY: Past Surgical History:  Procedure Laterality Date   BACK SURGERY     x3 L3-L4    CARDIAC ELECTROPHYSIOLOGY MAPPING AND ABLATION     CARDIOVERSION N/A 06/22/2019   Procedure: CARDIOVERSION;  Surgeon: Fay Records, MD;  Location: [place];  Service: Cardiovascular;  Laterality: N/A;   COLONOSCOPY WITH PROPOFOL N/A 06/15/2017   Procedure: COLONOSCOPY WITH PROPOFOL;  Surgeon: Toledo, Benay Pike, MD;  Location: [facility] ENDOSCOPY;  Service: Gastroenterology;  Laterality: N/A;   ENTEROSCOPY N/A 11/24/2017   Procedure: ENTEROSCOPY;  Surgeon: Jonathon Bellows, MD;  Location: [facility] ENDOSCOPY;  Service: Gastroenterology;  Laterality: N/A;   ESOPHAGOGASTRODUODENOSCOPY N/A 11/27/2017   Procedure: ESOPHAGOGASTRODUODENOSCOPY (EGD);  Surgeon: Lucilla Lame, MD;  Location: [facility] ENDOSCOPY;  Service: Endoscopy;  Laterality: N/A;   ESOPHAGOGASTRODUODENOSCOPY (EGD) WITH PROPOFOL N/A 06/02/2017   Procedure: ESOPHAGOGASTRODUODENOSCOPY (EGD) WITH PROPOFOL;  Surgeon: Toledo, Benay Pike, MD;  Location: [facility] ENDOSCOPY;  Service: Gastroenterology;  Laterality: N/A;   ESOPHAGOGASTRODUODENOSCOPY (EGD) WITH PROPOFOL N/A 08/04/2021   Procedure: ESOPHAGOGASTRODUODENOSCOPY (EGD) WITH PROPOFOL;  Surgeon: Lesly Rubenstein, MD;  Location: [facility] ENDOSCOPY;  Service: Endoscopy;  Laterality: N/A;   GIVENS CAPSULE STUDY N/A 11/25/2017   Procedure: GIVENS CAPSULE STUDY;  Surgeon: Jonathon Bellows, MD;  Location: [facility] ENDOSCOPY;  Service: Gastroenterology;  Laterality: N/A;   HAND SURGERY     RIGHT/LEFT HEART CATH AND CORONARY ANGIOGRAPHY N/A 01/17/2020   Procedure: RIGHT/LEFT HEART CATH AND CORONARY ANGIOGRAPHY;  Surgeon: Corey Skains,  MD;  Location: [place] CV LAB;  Service: Cardiovascular;  Laterality: N/A;    FAMILY HISTORY: Family History  Problem Relation Age of Onset    Heart disease Mother    Cancer Father    Cancer Paternal Grandfather     ADVANCED DIRECTIVES (Y/N):  N  HEALTH MAINTENANCE: Social History   Tobacco Use   Smoking status: Former    Packs/day: 1.00    Years: 30.00    Pack years: 30.00    Types: Cigarettes    Quit date: 1995    Years since quitting: 28.1   Smokeless tobacco: Never  Vaping Use   Vaping Use: Never used  Substance Use Topics   Alcohol use: No    Alcohol/week: 0.0 standard drinks    Comment: occasional   Drug use: No     Colonoscopy:  PAP:  Bone density:  Lipid panel:  Allergies  Allergen Reactions   Shrimp [Shellfish Allergy] Anaphylaxis   Shrimp Extract Allergy Skin Test     Current Outpatient Medications  Medication Sig Dispense Refill   acetaminophen (TYLENOL) 500 MG tablet Take 1 tablet (500 mg total) by mouth every 6 (six) hours as needed for moderate pain or headache. 30 tablet 0   albuterol (VENTOLIN HFA) 108 (90 Base) MCG/ACT inhaler Inhale 2 puffs into the lungs daily.     allopurinol (ZYLOPRIM) 100 MG tablet Take 2 tablets by mouth daily.     ascorbic acid (VITAMIN C) 500 MG tablet Take 500 mg by mouth daily.     atorvastatin (LIPITOR) 20 MG tablet Take 20 mg by mouth at bedtime.      BREO ELLIPTA 100-25 MCG/INH AEPB Inhale 1 puff into the lungs daily.     Cholecalciferol (DIALYVITE VITAMIN D 5000) 125 MCG (5000 UT) capsule Take 5,000 Units by mouth daily.     colchicine 0.6 MG tablet Take 0.6 mg by mouth daily as needed (gout flare).     ELIQUIS 5 MG TABS tablet SMARTSIG:1 Tablet(s) By Mouth Every 12 Hours     finasteride (PROSCAR) 5 MG tablet TAKE 1 TABLET BY MOUTH DAILY 30 tablet 11   furosemide (LASIX) 80 MG tablet Take 40 mg by mouth daily.     insulin aspart (NOVOLOG) 100 UNIT/ML FlexPen Inject 20-26 Units into the skin 3 (three) times daily with meals. 20 units am 26 units noon, 26 units evening     insulin degludec (TRESIBA FLEXTOUCH) 200 UNIT/ML FlexTouch Pen Inject 46 Units into the  skin at bedtime.     lisinopril (ZESTRIL) 20 MG tablet Take 10 mg by mouth at bedtime.      magnesium oxide (MAG-OX) 400 MG tablet Take 1 tablet by mouth 3 (three) times daily.     pantoprazole (PROTONIX) 40 MG tablet Take 1 tablet (40 mg total) by mouth 2 (two) times daily before a meal. 60 tablet 0   sildenafil (REVATIO) 20 MG tablet Take 20 mg by mouth 3 (three) times daily.     sodium chloride (OCEAN) 0.65 % SOLN nasal spray Place 1 spray into both nostrils as needed for congestion.     TYVASO REFILL 0.6 MG/ML SOLN Inhale 18 mcg into the lungs in the morning, at noon, in the evening, and at bedtime.     vitamin B-12 (CYANOCOBALAMIN) 1000 MCG tablet Take 1,000 mcg by mouth daily.     metoprolol tartrate (LOPRESSOR) 25 MG tablet Take 1 tablet (25 mg total) by mouth 2 (two) times daily. 60 tablet 0  No current facility-administered medications for this visit.   Facility-Administered Medications Ordered in Other Visits  Medication Dose Route Frequency Provider Last Rate Last Admin   epoetin alfa-epbx (RETACRIT) injection 40,000 Units  40,000 Units Subcutaneous Once Lloyd Huger, MD        OBJECTIVE: Vitals:   09/15/21 1419  BP: (!) 123/59  Pulse: 60  Temp: (!) 97 F (36.1 C)     There is no height or weight on file to calculate BMI.    ECOG FS:1 - Symptomatic but completely ambulatory  General: Well-developed, well-nourished, no acute distress. Eyes: Pink conjunctiva, anicteric sclera. HEENT: Normocephalic, moist mucous membranes. Lungs: No audible wheezing or coughing. Heart: Regular rate and rhythm. Abdomen: Soft, nontender, no obvious distention. Musculoskeletal: No edema, cyanosis, or clubbing. Neuro: Alert, answering all questions appropriately. Cranial nerves grossly intact. Skin: No rashes or petechiae noted. Psych: Normal affect.  LAB RESULTS:  Lab Results  Component Value Date   NA 137 08/07/2021   K 3.9 08/07/2021   CL 103 08/07/2021   CO2 26 08/07/2021    GLUCOSE 202 (H) 08/07/2021   BUN 38 (H) 08/07/2021   CREATININE 2.12 (H) 08/07/2021   CALCIUM 9.1 08/07/2021   PROT 6.1 (L) 08/03/2021   ALBUMIN 3.5 08/03/2021   AST 12 (L) 08/03/2021   ALT 13 08/03/2021   ALKPHOS 26 (L) 08/03/2021   BILITOT 0.5 08/03/2021   GFRNONAA 31 (L) 08/07/2021   GFRAA 39 (L) 10/18/2019    Lab Results  Component Value Date   WBC 8.6 09/15/2021   NEUTROABS 5.5 09/15/2021   HGB 9.3 (L) 09/15/2021   HCT 28.7 (L) 09/15/2021   MCV 97.3 09/15/2021   PLT 206 09/15/2021   Lab Results  Component Value Date   IRON 84 09/15/2021   TIBC 332 09/15/2021   IRONPCTSAT 25 09/15/2021   Lab Results  Component Value Date   FERRITIN 195 09/15/2021     STUDIES: No results found.   ASSESSMENT: Anemia, unspecified.  PLAN:   1. Anemia, unspecified: Bone marrow biopsy completed on June 25, 2020 was essentially negative only with a hypercellular marrow for age.  Patient was noted to have decreased iron stores and rare ringed sideroblasts.  FISH and cytogenetics were negative.  Iron stores are within normal limits.  Previously, repeat B12, folate, and hemolysis labs were found to be either negative or within normal limits.  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  He does not require additional Venofer today.  His last dose was on Dec 03, 2020.  Proceed with 40,000 units Retacrit.  Return to clinic monthly for laboratory work and Retacrit if his hemoglobin falls below 10.0.  Patient will then return to clinic in 4 months for further evaluation and continuation of treatment if needed.   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  Kappa free light chains are only mildly elevated and immunoglobulins are within normal limits.  Bone marrow biopsy only revealed approximately 1% plasma cells.  This is likely clinically insignificant, but will continue to monitor on a yearly basis. 3.  Chronic  renal insufficiency.  Chronic and unchanged.  Follow-up with nephrology as indicated.  Retacrit as above.  I spent a total of 30 minutes reviewing chart data, face-to-face evaluation with the patient, counseling and coordination of care as detailed above.   Patient expressed understanding and was in agreement with this plan. He also understands that He  can call clinic at any time with any questions, concerns, or complaints.    Lloyd Huger, MD   09/15/2021 2:58 PM

## 2021-09-15 ENCOUNTER — Inpatient Hospital Stay: Payer: Medicare HMO

## 2021-09-15 ENCOUNTER — Inpatient Hospital Stay: Payer: Medicare HMO | Attending: Oncology

## 2021-09-15 ENCOUNTER — Encounter: Payer: Self-pay | Admitting: Oncology

## 2021-09-15 ENCOUNTER — Inpatient Hospital Stay (HOSPITAL_BASED_OUTPATIENT_CLINIC_OR_DEPARTMENT_OTHER): Payer: Medicare HMO | Admitting: Oncology

## 2021-09-15 ENCOUNTER — Other Ambulatory Visit: Payer: Self-pay

## 2021-09-15 VITALS — BP 123/59 | HR 60 | Temp 97.0°F

## 2021-09-15 DIAGNOSIS — D649 Anemia, unspecified: Secondary | ICD-10-CM | POA: Diagnosis present

## 2021-09-15 DIAGNOSIS — D472 Monoclonal gammopathy: Secondary | ICD-10-CM

## 2021-09-15 DIAGNOSIS — N189 Chronic kidney disease, unspecified: Secondary | ICD-10-CM | POA: Insufficient documentation

## 2021-09-15 LAB — CBC WITH DIFFERENTIAL/PLATELET
Abs Immature Granulocytes: 0.02 10*3/uL (ref 0.00–0.07)
Basophils Absolute: 0.1 10*3/uL (ref 0.0–0.1)
Basophils Relative: 1 %
Eosinophils Absolute: 0.2 10*3/uL (ref 0.0–0.5)
Eosinophils Relative: 2 %
HCT: 28.7 % — ABNORMAL LOW (ref 39.0–52.0)
Hemoglobin: 9.3 g/dL — ABNORMAL LOW (ref 13.0–17.0)
Immature Granulocytes: 0 %
Lymphocytes Relative: 25 %
Lymphs Abs: 2.2 10*3/uL (ref 0.7–4.0)
MCH: 31.5 pg (ref 26.0–34.0)
MCHC: 32.4 g/dL (ref 30.0–36.0)
MCV: 97.3 fL (ref 80.0–100.0)
Monocytes Absolute: 0.7 10*3/uL (ref 0.1–1.0)
Monocytes Relative: 8 %
Neutro Abs: 5.5 10*3/uL (ref 1.7–7.7)
Neutrophils Relative %: 64 %
Platelets: 206 10*3/uL (ref 150–400)
RBC: 2.95 MIL/uL — ABNORMAL LOW (ref 4.22–5.81)
RDW: 17.4 % — ABNORMAL HIGH (ref 11.5–15.5)
WBC: 8.6 10*3/uL (ref 4.0–10.5)
nRBC: 0 % (ref 0.0–0.2)

## 2021-09-15 LAB — IRON AND TIBC
Iron: 84 ug/dL (ref 45–182)
Saturation Ratios: 25 % (ref 17.9–39.5)
TIBC: 332 ug/dL (ref 250–450)
UIBC: 248 ug/dL

## 2021-09-15 LAB — FERRITIN: Ferritin: 195 ng/mL (ref 24–336)

## 2021-09-15 MED ORDER — EPOETIN ALFA-EPBX 40000 UNIT/ML IJ SOLN
40000.0000 [IU] | Freq: Once | INTRAMUSCULAR | Status: AC
  Administered 2021-09-15: 40000 [IU] via SUBCUTANEOUS
  Filled 2021-09-15: qty 1

## 2021-10-13 ENCOUNTER — Other Ambulatory Visit: Payer: Self-pay

## 2021-10-13 ENCOUNTER — Inpatient Hospital Stay: Payer: Medicare HMO

## 2021-10-13 ENCOUNTER — Inpatient Hospital Stay: Payer: Medicare HMO | Attending: Oncology

## 2021-10-13 DIAGNOSIS — N1832 Chronic kidney disease, stage 3b: Secondary | ICD-10-CM | POA: Insufficient documentation

## 2021-10-13 DIAGNOSIS — D472 Monoclonal gammopathy: Secondary | ICD-10-CM | POA: Insufficient documentation

## 2021-10-13 DIAGNOSIS — D649 Anemia, unspecified: Secondary | ICD-10-CM | POA: Insufficient documentation

## 2021-10-13 LAB — CBC WITH DIFFERENTIAL/PLATELET
Abs Immature Granulocytes: 0.07 10*3/uL (ref 0.00–0.07)
Basophils Absolute: 0 10*3/uL (ref 0.0–0.1)
Basophils Relative: 0 %
Eosinophils Absolute: 0 10*3/uL (ref 0.0–0.5)
Eosinophils Relative: 0 %
HCT: 30.4 % — ABNORMAL LOW (ref 39.0–52.0)
Hemoglobin: 10.1 g/dL — ABNORMAL LOW (ref 13.0–17.0)
Immature Granulocytes: 1 %
Lymphocytes Relative: 10 %
Lymphs Abs: 1.2 10*3/uL (ref 0.7–4.0)
MCH: 31.6 pg (ref 26.0–34.0)
MCHC: 33.2 g/dL (ref 30.0–36.0)
MCV: 95 fL (ref 80.0–100.0)
Monocytes Absolute: 0.8 10*3/uL (ref 0.1–1.0)
Monocytes Relative: 6 %
Neutro Abs: 10.5 10*3/uL — ABNORMAL HIGH (ref 1.7–7.7)
Neutrophils Relative %: 83 %
Platelets: 243 10*3/uL (ref 150–400)
RBC: 3.2 MIL/uL — ABNORMAL LOW (ref 4.22–5.81)
RDW: 16.7 % — ABNORMAL HIGH (ref 11.5–15.5)
WBC: 12.6 10*3/uL — ABNORMAL HIGH (ref 4.0–10.5)
nRBC: 0 % (ref 0.0–0.2)

## 2021-10-13 LAB — FERRITIN: Ferritin: 198 ng/mL (ref 24–336)

## 2021-10-13 LAB — IRON AND TIBC
Iron: 107 ug/dL (ref 45–182)
Saturation Ratios: 33 % (ref 17.9–39.5)
TIBC: 328 ug/dL (ref 250–450)
UIBC: 221 ug/dL

## 2021-11-03 ENCOUNTER — Inpatient Hospital Stay: Payer: Medicare HMO

## 2021-11-03 ENCOUNTER — Inpatient Hospital Stay: Payer: Medicare HMO | Attending: Oncology

## 2021-11-03 VITALS — BP 105/49 | HR 60

## 2021-11-03 DIAGNOSIS — D649 Anemia, unspecified: Secondary | ICD-10-CM | POA: Diagnosis present

## 2021-11-03 DIAGNOSIS — N189 Chronic kidney disease, unspecified: Secondary | ICD-10-CM | POA: Insufficient documentation

## 2021-11-03 DIAGNOSIS — D472 Monoclonal gammopathy: Secondary | ICD-10-CM

## 2021-11-03 LAB — CBC WITH DIFFERENTIAL/PLATELET
Abs Immature Granulocytes: 0.02 10*3/uL (ref 0.00–0.07)
Basophils Absolute: 0 10*3/uL (ref 0.0–0.1)
Basophils Relative: 1 %
Eosinophils Absolute: 0.2 10*3/uL (ref 0.0–0.5)
Eosinophils Relative: 2 %
HCT: 30 % — ABNORMAL LOW (ref 39.0–52.0)
Hemoglobin: 9.6 g/dL — ABNORMAL LOW (ref 13.0–17.0)
Immature Granulocytes: 0 %
Lymphocytes Relative: 22 %
Lymphs Abs: 1.8 10*3/uL (ref 0.7–4.0)
MCH: 31.8 pg (ref 26.0–34.0)
MCHC: 32 g/dL (ref 30.0–36.0)
MCV: 99.3 fL (ref 80.0–100.0)
Monocytes Absolute: 0.6 10*3/uL (ref 0.1–1.0)
Monocytes Relative: 8 %
Neutro Abs: 5.6 10*3/uL (ref 1.7–7.7)
Neutrophils Relative %: 67 %
Platelets: 187 10*3/uL (ref 150–400)
RBC: 3.02 MIL/uL — ABNORMAL LOW (ref 4.22–5.81)
RDW: 17.2 % — ABNORMAL HIGH (ref 11.5–15.5)
WBC: 8.2 10*3/uL (ref 4.0–10.5)
nRBC: 0 % (ref 0.0–0.2)

## 2021-11-03 LAB — IRON AND TIBC
Iron: 50 ug/dL (ref 45–182)
Saturation Ratios: 14 % — ABNORMAL LOW (ref 17.9–39.5)
TIBC: 349 ug/dL (ref 250–450)
UIBC: 299 ug/dL

## 2021-11-03 LAB — FERRITIN: Ferritin: 237 ng/mL (ref 24–336)

## 2021-11-03 MED ORDER — EPOETIN ALFA-EPBX 40000 UNIT/ML IJ SOLN
40000.0000 [IU] | Freq: Once | INTRAMUSCULAR | Status: AC
  Administered 2021-11-03: 40000 [IU] via SUBCUTANEOUS
  Filled 2021-11-03: qty 1

## 2021-11-24 ENCOUNTER — Inpatient Hospital Stay: Payer: Medicare HMO | Attending: Oncology

## 2021-11-24 ENCOUNTER — Inpatient Hospital Stay: Payer: Medicare HMO

## 2021-11-24 VITALS — BP 120/56 | HR 58

## 2021-11-24 DIAGNOSIS — N189 Chronic kidney disease, unspecified: Secondary | ICD-10-CM | POA: Diagnosis present

## 2021-11-24 DIAGNOSIS — D472 Monoclonal gammopathy: Secondary | ICD-10-CM

## 2021-11-24 DIAGNOSIS — D649 Anemia, unspecified: Secondary | ICD-10-CM | POA: Insufficient documentation

## 2021-11-24 LAB — IRON AND TIBC
Iron: 47 ug/dL (ref 45–182)
Saturation Ratios: 14 % — ABNORMAL LOW (ref 17.9–39.5)
TIBC: 326 ug/dL (ref 250–450)
UIBC: 279 ug/dL

## 2021-11-24 LAB — CBC WITH DIFFERENTIAL/PLATELET
Abs Immature Granulocytes: 0.01 10*3/uL (ref 0.00–0.07)
Basophils Absolute: 0.1 10*3/uL (ref 0.0–0.1)
Basophils Relative: 1 %
Eosinophils Absolute: 0.1 10*3/uL (ref 0.0–0.5)
Eosinophils Relative: 2 %
HCT: 28.5 % — ABNORMAL LOW (ref 39.0–52.0)
Hemoglobin: 9.3 g/dL — ABNORMAL LOW (ref 13.0–17.0)
Immature Granulocytes: 0 %
Lymphocytes Relative: 28 %
Lymphs Abs: 2.3 10*3/uL (ref 0.7–4.0)
MCH: 32.3 pg (ref 26.0–34.0)
MCHC: 32.6 g/dL (ref 30.0–36.0)
MCV: 99 fL (ref 80.0–100.0)
Monocytes Absolute: 0.7 10*3/uL (ref 0.1–1.0)
Monocytes Relative: 9 %
Neutro Abs: 5.1 10*3/uL (ref 1.7–7.7)
Neutrophils Relative %: 60 %
Platelets: 231 10*3/uL (ref 150–400)
RBC: 2.88 MIL/uL — ABNORMAL LOW (ref 4.22–5.81)
RDW: 16.3 % — ABNORMAL HIGH (ref 11.5–15.5)
WBC: 8.3 10*3/uL (ref 4.0–10.5)
nRBC: 0 % (ref 0.0–0.2)

## 2021-11-24 LAB — FERRITIN: Ferritin: 138 ng/mL (ref 24–336)

## 2021-11-24 MED ORDER — EPOETIN ALFA-EPBX 40000 UNIT/ML IJ SOLN
40000.0000 [IU] | Freq: Once | INTRAMUSCULAR | Status: AC
  Administered 2021-11-24: 40000 [IU] via SUBCUTANEOUS
  Filled 2021-11-24: qty 1

## 2021-12-22 ENCOUNTER — Encounter: Payer: Self-pay | Admitting: Nurse Practitioner

## 2021-12-22 ENCOUNTER — Inpatient Hospital Stay: Payer: Medicare HMO | Attending: Oncology

## 2021-12-22 ENCOUNTER — Inpatient Hospital Stay: Payer: Medicare HMO | Admitting: Nurse Practitioner

## 2021-12-22 ENCOUNTER — Inpatient Hospital Stay: Payer: Medicare HMO

## 2021-12-22 VITALS — BP 112/58 | HR 75 | Temp 98.7°F | Resp 19 | Wt 186.8 lb

## 2021-12-22 DIAGNOSIS — D649 Anemia, unspecified: Secondary | ICD-10-CM

## 2021-12-22 DIAGNOSIS — J40 Bronchitis, not specified as acute or chronic: Secondary | ICD-10-CM | POA: Insufficient documentation

## 2021-12-22 DIAGNOSIS — Z79899 Other long term (current) drug therapy: Secondary | ICD-10-CM | POA: Insufficient documentation

## 2021-12-22 DIAGNOSIS — N184 Chronic kidney disease, stage 4 (severe): Secondary | ICD-10-CM | POA: Insufficient documentation

## 2021-12-22 DIAGNOSIS — D472 Monoclonal gammopathy: Secondary | ICD-10-CM | POA: Diagnosis not present

## 2021-12-22 DIAGNOSIS — D631 Anemia in chronic kidney disease: Secondary | ICD-10-CM | POA: Insufficient documentation

## 2021-12-22 DIAGNOSIS — Z87891 Personal history of nicotine dependence: Secondary | ICD-10-CM | POA: Insufficient documentation

## 2021-12-22 LAB — CBC WITH DIFFERENTIAL/PLATELET
Abs Immature Granulocytes: 0.08 10*3/uL — ABNORMAL HIGH (ref 0.00–0.07)
Basophils Absolute: 0.1 10*3/uL (ref 0.0–0.1)
Basophils Relative: 1 %
Eosinophils Absolute: 0 10*3/uL (ref 0.0–0.5)
Eosinophils Relative: 0 %
HCT: 31.3 % — ABNORMAL LOW (ref 39.0–52.0)
Hemoglobin: 10.2 g/dL — ABNORMAL LOW (ref 13.0–17.0)
Immature Granulocytes: 1 %
Lymphocytes Relative: 14 %
Lymphs Abs: 1.8 10*3/uL (ref 0.7–4.0)
MCH: 32.1 pg (ref 26.0–34.0)
MCHC: 32.6 g/dL (ref 30.0–36.0)
MCV: 98.4 fL (ref 80.0–100.0)
Monocytes Absolute: 1 10*3/uL (ref 0.1–1.0)
Monocytes Relative: 8 %
Neutro Abs: 10.1 10*3/uL — ABNORMAL HIGH (ref 1.7–7.7)
Neutrophils Relative %: 76 %
Platelets: 274 10*3/uL (ref 150–400)
RBC: 3.18 MIL/uL — ABNORMAL LOW (ref 4.22–5.81)
RDW: 15.3 % (ref 11.5–15.5)
WBC: 13.2 10*3/uL — ABNORMAL HIGH (ref 4.0–10.5)
nRBC: 0 % (ref 0.0–0.2)

## 2021-12-22 LAB — IRON AND TIBC
Iron: 50 ug/dL (ref 45–182)
Saturation Ratios: 15 % — ABNORMAL LOW (ref 17.9–39.5)
TIBC: 342 ug/dL (ref 250–450)
UIBC: 292 ug/dL

## 2021-12-22 LAB — FERRITIN: Ferritin: 158 ng/mL (ref 24–336)

## 2021-12-22 NOTE — Progress Notes (Signed)
[place]  Telephone:(336) [phone] Fax:(336) [phone]  ID: [name] OB: [birth year]  MR#: [id number]  GEZ#:[id number]  Patient Care Team: [name], MD as PCP - General (Internal Medicine)  CHIEF COMPLAINT: Anemia, unspecified.  INTERVAL HISTORY: Patient returns to clinic today for further evaluation and consideration of additional Retacrit. He was recently diagnosed with bronchhitis and is being treated with antibiotics and steroids. Feels better. Fevers have resolved. Has chronic shortness of breath. Denies recurrent infections though and fevers have resolved. He has a good appetite and denies weight loss.  Denies any chest pain, cough, or hemoptysis.  He denies any nausea, vomiting, constipation, or diarrhea.  He has no melena or hematochezia.  He has no urinary complaints.  Patient offers no further specific complaints today.  REVIEW OF SYSTEMS:   Review of Systems  Constitutional: Negative.  Negative for fever, malaise/fatigue and weight loss.  Respiratory:  Positive for shortness of breath. Negative for cough and hemoptysis.   Cardiovascular: Negative.  Negative for chest pain and leg swelling.  Gastrointestinal: Negative.  Negative for abdominal pain, blood in stool and melena.  Genitourinary: Negative.  Negative for hematuria.  Musculoskeletal: Negative.  Negative for back pain.  Skin: Negative.  Negative for rash.  Neurological: Negative.  Negative for dizziness, focal weakness, weakness and headaches.  Psychiatric/Behavioral: Negative.  The patient is not nervous/anxious.   As per HPI. Otherwise, a complete review of systems is negative.  PAST MEDICAL HISTORY: Past Medical History:  Diagnosis Date   Asthma    Atrial fibrillation (HCC)    BPH (benign prostatic hyperplasia)    COPD (chronic obstructive pulmonary disease) ([place])    Diabetes mellitus without complication (HCC)    Helicobacter pylori gastritis    Hyperlipidemia    Hypertension     Pneumonia    Right heart failure ([place])     PAST SURGICAL HISTORY: Past Surgical History:  Procedure Laterality Date   BACK SURGERY     x3 L3-L4    CARDIAC ELECTROPHYSIOLOGY MAPPING AND ABLATION     CARDIOVERSION N/A 06/22/2019   Procedure: CARDIOVERSION;  Surgeon: Fay Records, MD;  Location: [place];  Service: Cardiovascular;  Laterality: N/A;   COLONOSCOPY WITH PROPOFOL N/A 06/15/2017   Procedure: COLONOSCOPY WITH PROPOFOL;  Surgeon: Toledo, Benay Pike, MD;  Location: [facility] ENDOSCOPY;  Service: Gastroenterology;  Laterality: N/A;   ENTEROSCOPY N/A 11/24/2017   Procedure: ENTEROSCOPY;  Surgeon: Jonathon Bellows, MD;  Location: [facility] ENDOSCOPY;  Service: Gastroenterology;  Laterality: N/A;   ESOPHAGOGASTRODUODENOSCOPY N/A 11/27/2017   Procedure: ESOPHAGOGASTRODUODENOSCOPY (EGD);  Surgeon: Lucilla Lame, MD;  Location: [facility] ENDOSCOPY;  Service: Endoscopy;  Laterality: N/A;   ESOPHAGOGASTRODUODENOSCOPY (EGD) WITH PROPOFOL N/A 06/02/2017   Procedure: ESOPHAGOGASTRODUODENOSCOPY (EGD) WITH PROPOFOL;  Surgeon: Toledo, Benay Pike, MD;  Location: [facility] ENDOSCOPY;  Service: Gastroenterology;  Laterality: N/A;   ESOPHAGOGASTRODUODENOSCOPY (EGD) WITH PROPOFOL N/A 08/04/2021   Procedure: ESOPHAGOGASTRODUODENOSCOPY (EGD) WITH PROPOFOL;  Surgeon: Lesly Rubenstein, MD;  Location: [facility] ENDOSCOPY;  Service: Endoscopy;  Laterality: N/A;   GIVENS CAPSULE STUDY N/A 11/25/2017   Procedure: GIVENS CAPSULE STUDY;  Surgeon: Jonathon Bellows, MD;  Location: [facility] ENDOSCOPY;  Service: Gastroenterology;  Laterality: N/A;   HAND SURGERY     RIGHT/LEFT HEART CATH AND CORONARY ANGIOGRAPHY N/A 01/17/2020   Procedure: RIGHT/LEFT HEART CATH AND CORONARY ANGIOGRAPHY;  Surgeon: Corey Skains, MD;  Location: [place] CV LAB;  Service: Cardiovascular;  Laterality: N/A;    FAMILY HISTORY: Family History  Problem Relation Age of Onset  Heart disease Mother    Cancer Father    Cancer Paternal Grandfather     ADVANCED  DIRECTIVES (Y/N):  N  HEALTH MAINTENANCE: Social History   Tobacco Use   Smoking status: Former    Packs/day: 1.00    Years: 30.00    Pack years: 30.00    Types: Cigarettes    Quit date: 1995    Years since quitting: 28.4   Smokeless tobacco: Never  Vaping Use   Vaping Use: Never used  Substance Use Topics   Alcohol use: No    Alcohol/week: 0.0 standard drinks    Comment: occasional   Drug use: No     Colonoscopy:  PAP:  Bone density:  Lipid panel:  Allergies  Allergen Reactions   Shrimp [Shellfish Allergy] Anaphylaxis   Shrimp Extract Allergy Skin Test     Current Outpatient Medications  Medication Sig Dispense Refill   acetaminophen (TYLENOL) 500 MG tablet Take 1 tablet (500 mg total) by mouth every 6 (six) hours as needed for moderate pain or headache. 30 tablet 0   albuterol (VENTOLIN HFA) 108 (90 Base) MCG/ACT inhaler Inhale 2 puffs into the lungs daily.     allopurinol (ZYLOPRIM) 100 MG tablet Take 2 tablets by mouth daily.     ascorbic acid (VITAMIN C) 500 MG tablet Take 500 mg by mouth daily.     atorvastatin (LIPITOR) 20 MG tablet Take 20 mg by mouth at bedtime.      BREO ELLIPTA 100-25 MCG/INH AEPB Inhale 1 puff into the lungs daily.     Cholecalciferol (DIALYVITE VITAMIN D 5000) 125 MCG (5000 UT) capsule Take 5,000 Units by mouth daily.     colchicine 0.6 MG tablet Take 0.6 mg by mouth daily as needed (gout flare).     doxycycline (VIBRA-TABS) 100 MG tablet Take 100 mg by mouth 2 (two) times daily.     ELIQUIS 5 MG TABS tablet SMARTSIG:1 Tablet(s) By Mouth Every 12 Hours     finasteride (PROSCAR) 5 MG tablet TAKE 1 TABLET BY MOUTH DAILY 30 tablet 11   furosemide (LASIX) 80 MG tablet Take 40 mg by mouth daily.     insulin aspart (NOVOLOG) 100 UNIT/ML FlexPen Inject 20-26 Units into the skin 3 (three) times daily with meals. 20 units am 26 units noon, 26 units evening     insulin degludec (TRESIBA FLEXTOUCH) 200 UNIT/ML FlexTouch Pen Inject 46 Units into the  skin at bedtime.     lisinopril (ZESTRIL) 20 MG tablet Take 10 mg by mouth at bedtime.      magnesium oxide (MAG-OX) 400 MG tablet Take 1 tablet by mouth 3 (three) times daily.     metoprolol tartrate (LOPRESSOR) 25 MG tablet Take 1 tablet (25 mg total) by mouth 2 (two) times daily. 60 tablet 0   pantoprazole (PROTONIX) 40 MG tablet Take 1 tablet (40 mg total) by mouth 2 (two) times daily before a meal. 60 tablet 0   predniSONE (DELTASONE) 10 MG tablet Take by mouth.     sildenafil (REVATIO) 20 MG tablet Take 20 mg by mouth 3 (three) times daily.     sodium chloride (OCEAN) 0.65 % SOLN nasal spray Place 1 spray into both nostrils as needed for congestion.     TYVASO REFILL 0.6 MG/ML SOLN Inhale 18 mcg into the lungs in the morning, at noon, in the evening, and at bedtime.     vitamin B-12 (CYANOCOBALAMIN) 1000 MCG tablet Take 1,000 mcg by mouth daily.  No current facility-administered medications for this visit.    OBJECTIVE: Vitals:   12/22/21 1423  BP: (!) 112/58  Pulse: 75  Resp: 19  Temp: 98.7 F (37.1 C)  SpO2: 90%     Body mass index is 29.26 kg/m.    ECOG FS:1 - Symptomatic but completely ambulatory  General:  well developed. Obese. In wheelchair. Accompanied by wife Eyes: Pink conjunctiva, anicteric sclera. Lungs: nasal cannula. No audible wheezing or coughing.  Heart: Regular rate and rhythm.  Abdomen: Soft, nontender, nondistended.  Musculoskeletal: No edema, cyanosis, or clubbing. Neuro: Alert, answering all questions appropriately. Cranial nerves grossly intact. Skin: No rashes or petechiae noted. Psych: Normal affect.  LAB RESULTS:  Lab Results  Component Value Date   NA 137 08/07/2021   K 3.9 08/07/2021   CL 103 08/07/2021   CO2 26 08/07/2021   GLUCOSE 202 (H) 08/07/2021   BUN 38 (H) 08/07/2021   CREATININE 2.12 (H) 08/07/2021   CALCIUM 9.1 08/07/2021   PROT 6.1 (L) 08/03/2021   ALBUMIN 3.5 08/03/2021   AST 12 (L) 08/03/2021   ALT 13 08/03/2021    ALKPHOS 26 (L) 08/03/2021   BILITOT 0.5 08/03/2021   GFRNONAA 31 (L) 08/07/2021   GFRAA 39 (L) 10/18/2019    Lab Results  Component Value Date   WBC 13.2 (H) 12/22/2021   NEUTROABS 10.1 (H) 12/22/2021   HGB 10.2 (L) 12/22/2021   HCT 31.3 (L) 12/22/2021   MCV 98.4 12/22/2021   PLT 274 12/22/2021   Lab Results  Component Value Date   IRON 47 11/24/2021   TIBC 326 11/24/2021   IRONPCTSAT 14 (L) 11/24/2021   Lab Results  Component Value Date   FERRITIN 138 11/24/2021   STUDIES: No results found.   ASSESSMENT: Anemia, unspecified.  PLAN:   1. Anemia, unspecified: Bone marrow biopsy completed on June 25, 2020 was essentially negative only with a hypercellular marrow for age.  Patient was noted to have decreased iron stores and rare ringed sideroblasts.  FISH and cytogenetics were negative. Previously, repeat B12, folate, and hemolysis labs were found to be either negative or within normal limits.  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 Iron stores and ferritin pending at time of visit, now resulted. Ferritin is sufficient. No role for IV iron at this time. Hemoglobin stable to improved at 10.2 today. He does not require retacrit today. Plan to return to clinic monthly for labs +/- retacrit if hemoglobin < 10. RTC in 4 months for further evaluation and continuation of treatment if needed.   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  Kappa free light chains are only mildly elevated and immunoglobulins are within normal limits.  Bone marrow biopsy only revealed approximately 1% plasma cells.  This is likely clinically insignificant, but will continue to monitor on a yearly basis. Plan to check at next visit.   3.  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 Chronic and unchanged.  Follow-up with nephrology as indicated.  Retacrit as above.   4. Bronchitis/URI- s/p doxycycline and prednisone. Symptoms  improving. Follow up with pcp as needed.   Disposition: 1 month- H&H, +/- retacrit 2 month- H&H, +/- retacrit 3 month- H&H, +/- retacrit 4 months for labs (cbc, cmp, ferritin, iron studies, SPEP, Kappa lambda light chains), Dr. Grayland Ormond, +/- retacrit.   I spent a total of 20 minutes reviewing chart data, face-to-face evaluation with the patient, counseling  and coordination of care as detailed above.  Patient expressed understanding and was in agreement with this plan. He also understands that He can call clinic at any time with any questions, concerns, or complaints.   Verlon Au, NP   12/22/2021

## 2021-12-29 ENCOUNTER — Encounter: Payer: Self-pay | Admitting: Oncology

## 2022-01-13 ENCOUNTER — Ambulatory Visit: Payer: Self-pay | Admitting: Surgery

## 2022-01-13 NOTE — H&P (View-Only) (Signed)
Subjective: CC: Grade II hemorrhoids [K64.1]   HPI:  Jay Morris is a 76 y.o. male who was referrred by Jeffrey D Sparks, MD for above. Symptoms were first noted very long time ago. Recently started with pain about one year ago.  he has some rectal bleeding occurring a few times per week. Bleeding is described as moderate, filling commode with blood. Pain is sharp and intermittent, confined to the perianal area, without radiation.  Associated with nothing, exacerbated by nothing.   Last colonoscopy 2018, no return date due to date.   Past Medical History:  has a past medical history of Aortic atherosclerosis (CMS-HCC) (11/2017), Asthma without status asthmaticus, Atrial fibrillation (CMS-HCC), CKD (chronic kidney disease) stage 3, GFR 30-59 ml/min (CMS-HCC), COPD (chronic obstructive pulmonary disease) (CMS-HCC), COVID-19 virus detected (05/30/2019), GI bleed (11/2017), Helicobacter pylori gastritis, Hyperlipidemia, Hypertension, Paroxysmal atrial fibrillation (CMS-HCC), Pneumonia, TR (tricuspid regurgitation), and Type 2 diabetes mellitus (CMS-HCC).  Past Surgical History:  has a past surgical history that includes CARDIAC ABLATION; back surgery (1990); hand surgery  (2004); egd (06/02/2017); Colonoscopy (06/15/2017); and egd (08/04/2021).  Family History: family history includes Diabetes in his mother and paternal grandfather; Heart disease in his maternal grandfather, maternal grandmother, and mother.  Social History:  reports that he quit smoking about 25 years ago. His smoking use included cigarettes. He has a 40.00 pack-year smoking history. He has never used smokeless tobacco. He reports current alcohol use. He reports that he does not use drugs.  Current Medications: has a current medication list which includes the following prescription(s): acetaminophen, allopurinol, ascorbic acid (vitamin c), atorvastatin, breo ellipta, calcitriol, cholecalciferol, colchicine, cyanocobalamin,  eliquis, finasteride, furosemide, furosemide, hydrocodone-acetaminophen, insulin aspart, insulin degludec, lisinopril, magnesium oxide, metoprolol tartrate, pantoprazole, sildenafil, sodium chloride, tamsulosin, treprostinil, and tyvaso.  Allergies:  Allergies as of 01/13/2022 - Reviewed 01/13/2022 Allergen Reaction Noted  Shellfish containing products Anaphylaxis 01/04/2016   ROS:  A 15 point review of systems was performed and pertinent positives and negatives noted in HPI  Objective:    BP 138/81   Pulse 66   Ht 165.1 cm (5' 5")   Wt 85.7 kg (189 lb)   BMI 31.45 kg/m   Constitutional :  No distress, cooperative, alert Lymphatics/Throat:  Supple with no lymphadenopathy Respiratory:  Clear to auscultation bilaterally Cardiovascular:  Regular rate and rhythm Gastrointestinal: Soft, non-tender, non-distended, no organomegaly. Musculoskeletal: Steady gait and movement Skin: Cool and moist Psychiatric: Normal affect, non-agitated, not confused Rectal: Chaperone present for exam.  External exam noted to have several external skin tags, w DRE revealed, normal rectal tone, with palpable internal hemorrhoid noted at RP and LL portion with minimal discomfort.  After obtaining verbal consent, an anoscope was inserted after prepped with lubricant and internal hemorrhoids noted on DRE confirmed visually, with no evidence of thrombosis. No other pathology such as fissures, fistulas, polyps noted.  Scope withdrawn and patient tolerate procedure well.      LABS:  N/a   RADS:  Assessment:    Grade II hemorrhoids [K64.1] -despite increase risk from comorbities, benefit from proceeding should outweigh risks due to reporte degree of bleeding and amount of tissue noted on exam today  Plan:   1. Grade II hemorrhoids [K64.1] Discussed risks/benefits/alternatives to surgery.  Alternatives include the options of observation, medical management.  Benefits include symptomatic relief.  I discussed   in detail and the complications related to the operation and the anesthesia, including bleeding, infection, recurrence, remote possibility of temporary or permanent fecal incontinence, poor/delayed   wound healing, chronic pain, and additional procedures to address said risks. The risks of general anesthetic, if used, includes MI, CVA, sudden death or even reaction to anesthetic medications also discussed.   We also discussed typical post operative recovery which includes weeks to potentially months of anal pain, drainage, occasional bleeding, and sense of fecal urgency.    ED return precautions given for sudden increase in pain, bleeding, with possible accompanying fever, nausea, and/or vomiting.  The patient understands the risks, any and all questions were answered to the patient's satisfaction.  2. Patient has elected to proceed with surgical treatment. Procedure will be scheduled. Stop anticoag, will obtain risk stratification as well.  labs/images/medications/previous chart entries reviewed personally and relevant changes/updates noted above.

## 2022-01-13 NOTE — H&P (Signed)
Subjective: CC: Grade II hemorrhoids [K64.1]   HPI:  Jay Morris is a 76 y.o. male who was referrred by [name], MD for above. Symptoms were first noted very long time ago. Recently started with pain about one year ago.  he has some rectal bleeding occurring a few times per week. Bleeding is described as moderate, filling commode with blood. Pain is sharp and intermittent, confined to the perianal area, without radiation.  Associated with nothing, exacerbated by nothing.   Last colonoscopy 2018, no return date due to date.   Past Medical History:  has a past medical history of Aortic atherosclerosis (CMS-HCC) (11/2017), Asthma without status asthmaticus, Atrial fibrillation (CMS-HCC), CKD (chronic kidney disease) stage 3, GFR 30-59 ml/min (CMS-HCC), COPD (chronic obstructive pulmonary disease) (CMS-HCC), COVID-19 virus detected (05/30/2019), GI bleed (95/6213), Helicobacter pylori gastritis, Hyperlipidemia, Hypertension, Paroxysmal atrial fibrillation (CMS-HCC), Pneumonia, TR (tricuspid regurgitation), and Type 2 diabetes mellitus (CMS-HCC).  Past Surgical History:  has a past surgical history that includes CARDIAC ABLATION; back surgery (1990); hand surgery  (2004); egd (06/02/2017); Colonoscopy (06/15/2017); and egd (08/04/2021).  Family History: family history includes Diabetes in his mother and paternal grandfather; Heart disease in his maternal grandfather, maternal grandmother, and mother.  Social History:  reports that he quit smoking about 25 years ago. His smoking use included cigarettes. He has a 40.00 pack-year smoking history. He has never used smokeless tobacco. He reports current alcohol use. He reports that he does not use drugs.  Current Medications: has a current medication list which includes the following prescription(s): acetaminophen, allopurinol, ascorbic acid (vitamin c), atorvastatin, breo ellipta, calcitriol, cholecalciferol, colchicine, cyanocobalamin,  eliquis, finasteride, furosemide, furosemide, hydrocodone-acetaminophen, insulin aspart, insulin degludec, lisinopril, magnesium oxide, metoprolol tartrate, pantoprazole, sildenafil, sodium chloride, tamsulosin, treprostinil, and tyvaso.  Allergies:  Allergies as of 01/13/2022 - Reviewed 01/13/2022 Allergen Reaction Noted  Shellfish containing products Anaphylaxis 01/04/2016   ROS:  A 15 point review of systems was performed and pertinent positives and negatives noted in HPI  Objective:    BP 138/81   Pulse 66   Ht 165.1 cm (_0 )   Wt 85.7 kg (189 lb)   BMI 31.45 kg/m   Constitutional :  No distress, cooperative, alert Lymphatics/Throat:  Supple with no lymphadenopathy Respiratory:  Clear to auscultation bilaterally Cardiovascular:  Regular rate and rhythm Gastrointestinal: Soft, non-tender, non-distended, no organomegaly. Musculoskeletal: Steady gait and movement Skin: Cool and moist Psychiatric: Normal affect, non-agitated, not confused Rectal: Chaperone present for exam.  External exam noted to have several external skin tags, w DRE revealed, normal rectal tone, with palpable internal hemorrhoid noted at RP and LL portion with minimal discomfort.  After obtaining verbal consent, an anoscope was inserted after prepped with lubricant and internal hemorrhoids noted on DRE confirmed visually, with no evidence of thrombosis. No other pathology such as fissures, fistulas, polyps noted.  Scope withdrawn and patient tolerate procedure well.      LABS:  N/a   RADS:  Assessment:    Grade II hemorrhoids [K64.1] -despite increase risk from comorbities, benefit from proceeding should outweigh risks due to reporte degree of bleeding and amount of tissue noted on exam today  Plan:   1. Grade II hemorrhoids [K64.1] Discussed risks/benefits/alternatives to surgery.  Alternatives include the options of observation, medical management.  Benefits include symptomatic relief.  I discussed   in detail and the complications related to the operation and the anesthesia, including bleeding, infection, recurrence, remote possibility of temporary or permanent fecal incontinence, poor/delayed  wound healing, chronic pain, and additional procedures to address said risks. The risks of general anesthetic, if used, includes MI, CVA, sudden death or even reaction to anesthetic medications also discussed.   We also discussed typical post operative recovery which includes weeks to potentially months of anal pain, drainage, occasional bleeding, and sense of fecal urgency.    ED return precautions given for sudden increase in pain, bleeding, with possible accompanying fever, nausea, and/or vomiting.  The patient understands the risks, any and all questions were answered to the patient's satisfaction.  2. Patient has elected to proceed with surgical treatment. Procedure will be scheduled. Stop anticoag, will obtain risk stratification as well.  labs/images/medications/previous chart entries reviewed personally and relevant changes/updates noted above.

## 2022-01-21 ENCOUNTER — Inpatient Hospital Stay: Payer: Medicare HMO

## 2022-01-21 ENCOUNTER — Inpatient Hospital Stay: Payer: Medicare HMO | Attending: Oncology

## 2022-01-21 VITALS — BP 113/63 | HR 67

## 2022-01-21 DIAGNOSIS — D649 Anemia, unspecified: Secondary | ICD-10-CM | POA: Diagnosis present

## 2022-01-21 DIAGNOSIS — N184 Chronic kidney disease, stage 4 (severe): Secondary | ICD-10-CM | POA: Insufficient documentation

## 2022-01-21 DIAGNOSIS — D631 Anemia in chronic kidney disease: Secondary | ICD-10-CM

## 2022-01-21 LAB — HEMOGLOBIN AND HEMATOCRIT, BLOOD
HCT: 28.1 % — ABNORMAL LOW (ref 39.0–52.0)
Hemoglobin: 9 g/dL — ABNORMAL LOW (ref 13.0–17.0)

## 2022-01-21 MED ORDER — EPOETIN ALFA-EPBX 40000 UNIT/ML IJ SOLN
40000.0000 [IU] | Freq: Once | INTRAMUSCULAR | Status: AC
  Administered 2022-01-21: 40000 [IU] via SUBCUTANEOUS
  Filled 2022-01-21: qty 1

## 2022-02-01 ENCOUNTER — Ambulatory Visit: Payer: Medicare HMO | Admitting: Physician Assistant

## 2022-02-01 ENCOUNTER — Encounter: Payer: Self-pay | Admitting: Physician Assistant

## 2022-02-01 VITALS — BP 107/61 | HR 80 | Ht 68.0 in | Wt 189.0 lb

## 2022-02-01 DIAGNOSIS — N471 Phimosis: Secondary | ICD-10-CM

## 2022-02-01 DIAGNOSIS — N476 Balanoposthitis: Secondary | ICD-10-CM

## 2022-02-01 MED ORDER — NYSTATIN-TRIAMCINOLONE 100000-0.1 UNIT/GM-% EX OINT: 1.0000 | TOPICAL_OINTMENT | Freq: Two times a day (BID) | CUTANEOUS | 1 refills | Status: DC

## 2022-02-01 NOTE — Progress Notes (Signed)
02/01/2022 5:29 PM   Jay Morris 10-06-1945 [id number]  CC: Chief Complaint  Patient presents with   Other   HPI: Jay Morris is a 76 y.o. male with PMH diabetes, COPD, CHF, CKD 4, and BPH with urinary retention managed by [facility] on finasteride who presents today for evaluation of foreskin swelling and pain.   Today he reports he has had increasing difficulty with self-catheterization for the past 1 to 2 weeks due to pain and difficult with retracting the foreskin.  He states once the foreskin is retracted, he is able to insert the catheter to the level of the bladder without difficulty.  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[place])     Surgical History: Past Surgical History:  Procedure Laterality Date   BACK SURGERY     x3 L3-L4    CARDIAC ELECTROPHYSIOLOGY MAPPING AND ABLATION     CARDIOVERSION N/A 06/22/2019   Procedure: CARDIOVERSION;  Surgeon: Fay Records, MD;  Location: [place];  Service: Cardiovascular;  Laterality: N/A;   COLONOSCOPY WITH PROPOFOL N/A 06/15/2017   Procedure: COLONOSCOPY WITH PROPOFOL;  Surgeon: Toledo, Benay Pike, MD;  Location: [facility] ENDOSCOPY;  Service: Gastroenterology;  Laterality: N/A;   ENTEROSCOPY N/A 11/24/2017   Procedure: ENTEROSCOPY;  Surgeon: Jonathon Bellows, MD;  Location: [facility] ENDOSCOPY;  Service: Gastroenterology;  Laterality: N/A;   ESOPHAGOGASTRODUODENOSCOPY N/A 11/27/2017   Procedure: ESOPHAGOGASTRODUODENOSCOPY (EGD);  Surgeon: Lucilla Lame, MD;  Location[facility] ENDOSCOPY;  Service: Endoscopy;  Laterality: N/A;   ESOPHAGOGASTRODUODENOSCOPY (EGD) WITH PROPOFOL N/A 06/02/2017   Procedure: ESOPHAGOGASTRODUODENOSCOPY (EGD) WITH PROPOFOL;  Surgeon:  Toledo, Benay Pike, MD;  Location: [facility] ENDOSCOPY;  Service: Gastroenterology;  Laterality: N/A;   ESOPHAGOGASTRODUODENOSCOPY (EGD) WITH PROPOFOL N/A 08/04/2021   Procedure: ESOPHAGOGASTRODUODENOSCOPY (EGD) WITH PROPOFOL;  Surgeon: Lesly Rubenstein, MD;  Location: [facility] ENDOSCOPY;  Service: Endoscopy;  Laterality: N/A;   GIVENS CAPSULE STUDY N/A 11/25/2017   Procedure: GIVENS CAPSULE STUDY;  Surgeon: Jonathon Bellows, MD;  Location: [facility] ENDOSCOPY;  Service: Gastroenterology;  Laterality: N/A;   HAND SURGERY     RIGHT/LEFT HEART CATH AND CORONARY ANGIOGRAPHY N/A 01/17/2020   Procedure: RIGHT/LEFT HEART CATH AND CORONARY ANGIOGRAPHY;  Surgeon: Corey Skains, MD;  Location: [place] [facility];  Service: Cardiovascular;  Laterality: N/A;    Home Medications:  Allergies as of 02/01/2022       Reactions   Shrimp [shellfish Allergy] Anaphylaxis   Shrimp Extract Allergy Skin Test         Medication List        Accurate as of February 01, 2022  5:29 PM. If you have any questions, ask your nurse or doctor.          STOP taking these medications    metoprolol tartrate 25 MG tablet Commonly known as: LOPRESSOR Stopped by: Debroah Loop, PA-C       TAKE these medications    acetaminophen 500 MG tablet Commonly known as: TYLENOL Take 1 tablet (500 mg total) by mouth every 6 (six) hours as needed for moderate pain or headache.   albuterol 108 (90 Base) MCG/ACT inhaler Commonly known as: VENTOLIN HFA Inhale 2 puffs into the lungs daily.  allopurinol 100 MG tablet Commonly known as: ZYLOPRIM Take 2 tablets by mouth daily.   ascorbic acid 500 MG tablet Commonly known as: VITAMIN C Take 500 mg by mouth daily.   atorvastatin 20 MG tablet Commonly known as: LIPITOR Take 20 mg by mouth at bedtime.   Breo Ellipta 100-25 MCG/ACT Aepb Generic drug: fluticasone furoate-vilanterol Inhale 1 puff into the lungs daily.   colchicine 0.6 MG tablet Take 0.6 mg by mouth daily as  needed (gout flare).   Dialyvite Vitamin D 5000 125 MCG (5000 UT) capsule Generic drug: Cholecalciferol Take 5,000 Units by mouth daily.   doxycycline 100 MG tablet Commonly known as: VIBRA-TABS Take 100 mg by mouth 2 (two) times daily.   Eliquis 5 MG Tabs tablet Generic drug: apixaban SMARTSIG:1 Tablet(s) By Mouth Every 12 Hours   finasteride 5 MG tablet Commonly known as: PROSCAR TAKE 1 TABLET BY MOUTH DAILY   furosemide 80 MG tablet Commonly known as: LASIX Take 40 mg by mouth daily.   insulin aspart 100 UNIT/ML FlexPen Commonly known as: NOVOLOG Inject 20-26 Units into the skin 3 (three) times daily with meals. 20 units am 26 units noon, 26 units evening   lisinopril 20 MG tablet Commonly known as: ZESTRIL Take 10 mg by mouth at bedtime.   magnesium oxide 400 MG tablet Commonly known as: MAG-OX Take 1 tablet by mouth 3 (three) times daily.   nystatin-triamcinolone ointment Commonly known as: MYCOLOG Apply 1 Application topically 2 (two) times daily. Do not use for longer than 4-6 weeks. Started by: Debroah Loop, PA-C   pantoprazole 40 MG tablet Commonly known as: PROTONIX Take 1 tablet (40 mg total) by mouth 2 (two) times daily before a meal.   predniSONE 10 MG tablet Commonly known as: DELTASONE Take by mouth.   sildenafil 20 MG tablet Commonly known as: REVATIO Take 20 mg by mouth 3 (three) times daily.   sodium chloride 0.65 % Soln nasal spray Commonly known as: OCEAN Place 1 spray into both nostrils as needed for congestion.   Tyler Aas FlexTouch 200 UNIT/ML FlexTouch Pen Generic drug: insulin degludec Inject 46 Units into the skin at bedtime.   Tyvaso Refill 0.6 MG/ML Soln Generic drug: Treprostinil Inhale 18 mcg into the lungs in the morning, at noon, in the evening, and at bedtime.   vitamin B-12 1000 MCG tablet Commonly known as: CYANOCOBALAMIN Take 1,000 mcg by mouth daily.        Allergies:  Allergies  Allergen Reactions    Shrimp [Shellfish Allergy] Anaphylaxis   Shrimp Extract Allergy Skin Test     Family History: Family History  Problem Relation Age of Onset   Heart disease Mother    Cancer Father    Cancer Paternal Grandfather     Social History:   reports that he quit smoking about 28 years ago. His smoking use included cigarettes. He has a 30.00 pack-year smoking history. He has never used smokeless tobacco. He reports that he does not drink alcohol and does not use drugs.  Physical Exam: BP 107/61   Pulse 80   Ht _0  (1.727 m)   Wt 189 lb (85.7 kg)   BMI 28.74 kg/m   Constitutional:  Alert and oriented, no acute distress, nontoxic appearing HEENT: [place], AT Cardiovascular: No clubbing, cyanosis, or edema Respiratory: Normal respiratory effort, no increased work of breathing GU: Mild phimosis with fissures at the inferior border of the foreskin consistent with retraction.  Inferior border of the foreskin is a  bit hypopigmented, with white, thick discharge noted underneath on the glans penis. 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  Fortunately, the urethral meatus is visible beneath the foreskin.  We will prescribe Mycolog ointment today for management of yeast balanoposthitis as well as phimosis.  We discussed applying this no longer than 4 to 6 weeks.  If he continues to have difficulty retracting the foreskin, we discussed consideration of circumcision versus dorsal slit.  He understands to return to clinic with persistent difficulty with foreskin retraction so that we may arrange this. - nystatin-triamcinolone ointment (MYCOLOG); Apply 1 Application topically 2 (two) times daily. Do not use for longer than 4-6 weeks.  Dispense: 30 g; Refill: 1  Return if  symptoms worsen or fail to improve.  Debroah Loop, PA-C  [facility] [address], [place], [place] [address] [phone]

## 2022-02-03 ENCOUNTER — Encounter
Admission: RE | Admit: 2022-02-03 | Discharge: 2022-02-03 | Disposition: A | Discharge status: Home / Self Care | Payer: Medicare HMO | Source: Ambulatory Visit | Attending: Surgery | Admitting: Surgery

## 2022-02-03 VITALS — Ht 68.0 in | Wt 189.0 lb

## 2022-02-03 DIAGNOSIS — E1122 Type 2 diabetes mellitus with diabetic chronic kidney disease: Secondary | ICD-10-CM

## 2022-02-03 HISTORY — DX: Anemia, unspecified: D64.9

## 2022-02-03 NOTE — Patient Instructions (Addendum)
Your procedure is scheduled on: Friday February 12, 2022. Report to Day Surgery inside [place] 2nd floor, stop by admissions desk before getting on elevator.  To find out your arrival time please call [phone] between 1PM - 3PM on Thursday February 11, 2022.  Remember: Instructions that are not followed completely may result in serious medical risk,  up to and including death, or upon the discretion of your surgeon and anesthesiologist your  surgery may need to be rescheduled.     _X__ 1. Do not eat food after midnight the night before your procedure.                 No chewing gum or hard candies. You may drink clear liquids up to 2 hours                 before you are scheduled to arrive for your surgery- DO not drink clear                 liquids within 2 hours of the start of your surgery.                 Clear Liquids include:  water.   __X__2.  On the morning of surgery brush your teeth with toothpaste and water, you                may rinse your mouth with mouthwash if you wish.  Do not swallow any toothpaste or mouthwash.     _X__ 3.  No Alcohol for 24 hours before or after surgery.   _X__ 4.  Do Not Smoke or use e-cigarettes For 24 Hours Prior to Your Surgery.                 Do not use any chewable tobacco products for at least 6 hours prior to                 Surgery.  _X__  5.  Do not use any recreational drugs (marijuana, cocaine, heroin, ecstasy, MDMA or other)                For at least one week prior to your surgery.  Combination of these drugs with anesthesia                May have life threatening results.  ____  6.  Bring all medications with you on the day of surgery if instructed.   __X_ 7.  Notify your doctor if there is any change in your medical condition      (cold, fever, infections).     Do not wear jewelry, make-up, hairpins, clips or nail polish. Do not wear lotions, powders, or perfumes. You may wear deodorant. Do not shave  48 hours prior to surgery. Men may shave face and neck. Do not bring valuables to the hospital.    [facility] is not responsible for any belongings or valuables.  Contacts, dentures or bridgework may not be worn into surgery. Leave your suitcase in the car. After surgery it may be brought to your room. For patients admitted to the hospital, discharge time is determined by your treatment team.   Patients discharged the day of surgery will not be allowed to drive home.   Make arrangements for someone to be with you for the first 24 hours of your Same Day Discharge.   __X__ Take these medicines the morning of surgery with A SIP OF WATER:  1. allopurinol (ZYLOPRIM) 100 MG tablet  2. finasteride (PROSCAR) 5 MG  3. pantoprazole (PROTONIX) 40 MG  4.    5.  6.  ____ Fleet Enema (as directed)   ____ Use CHG Soap (or wipes) as directed  ____ Use Benzoyl Peroxide Gel as instructed  __X__ Use inhalers on the day of surgery  BREO ELLIPTA 100-25 MCG/INH AEPB  TYVASO REFILL 0.6 MG/ML SOLN  ____ Stop metformin 2 days prior to surgery    __X__ Take usual insulin dose the night before surgery. No insulin the morning          of surgery. insulin degludec (TRESIBA FLEXTOUCH) 200 UNIT/ML FlexTouch Pen  __X__ Stop ELIQUIS 5 MG TABS 3 days prior to surgery as instructed by your doctor. Take last dose (Monday 02/08/22)  __X__ One Week prior to surgery- Stop Anti-inflammatories such as Ibuprofen, Aleve, Advil, Motrin, meloxicam (MOBIC), diclofenac, etodolac, ketorolac, Toradol, Daypro, piroxicam, Goody's or BC powders. OK TO USE TYLENOL IF NEEDED   __X__ One week prior to surgery-stop ALL vitamins and supplements until after surgery. ascorbic acid (VITAMIN C), vitamin D and B12   ____ Bring C-Pap to the hospital.    If you have any questions regarding your pre-procedure instructions,  Please call Pre-admit Testing at [phone]

## 2022-02-09 ENCOUNTER — Encounter: Payer: Self-pay | Admitting: Surgery

## 2022-02-09 NOTE — Progress Notes (Incomplete)
Perioperative Services  Pre-Admission/Anesthesia Testing Clinical Review  Date: 02/09/22  Patient Demographics:  Name: Jay Morris DOB:   03-12-1946 MRN:   [id number]  Planned Surgical Procedure(s):    Case: [id number] Date/Time: 02/12/22 0715   Procedure: EXAM UNDER ANESTHESIA WITH HEMORRHOIDECTOMY   Anesthesia type: General   Pre-op diagnosis: Grade II hemorrhoids K64.1   Location: [facility] OR ROOM 04 / [facility] [facility] FOR ANESTHESIA GROUP   Surgeons: Benjamine Sprague, DO   NOTE: Available PAT nursing documentation and vital signs have been reviewed. Clinical nursing staff has updated patient's PMH/PSHx, current medication list, and drug allergies/intolerances to ensure comprehensive history available to assist in medical decision making as it pertains to the aforementioned surgical procedure and anticipated anesthetic course. Extensive review of available clinical information performed. [place] PMH and PSHx updated with any diagnoses/procedures that  may have been inadvertently omitted during his intake with the pre-admission testing department's nursing staff.  Clinical Discussion:  Jay Morris is a 76 y.o. male who is submitted for pre-surgical anesthesia review and clearance prior to him undergoing the above procedure. Patient is a Former Smoker (30 pack years; quit 07/1993). Pertinent PMH includes: CAD, HFpEF, PAF, pulmonary hypertension, aortic atherosclerosis, HTN, HLD, T2DM, CKD-III, GERD (on daily PPI), COPD, asthma, hemorrhoids, anemia, BPH, lumbar DDD.  Patient is followed by cardiology Nehemiah Massed, MD). He was last seen in the cardiology clinic on 02/01/2022; notes reviewed.  At the time of this clinic visit, patient doing well overall from a cardiovascular perspective.  He denied any episodes of chest pain, however had chronic dyspnea related to his underlying cardiovascular/cardiopulmonary diagnoses.  Patient jointly managed by pulmonary medicine Raul Del, MD).  He denied any PND,  orthopnea, palpitations, significant peripheral edema, vertiginous symptoms, or presyncope/syncope.  Patient with past medical history significant for cardiovascular diagnoses.  Diagnostic left heart catheterization was performed on 12/30/2006 revealing insignificant nonobstructive CAD; 20% ostial LAD and 10% LCx.  Intervention was deferred opted for medical management.  Patient underwent cardiac ablation for atrial fibrillation in approximately 2014-2015 at [facility]  Patient with a history of pulmonary hypertension diagnosed via TTE back on 04/01/2016.  At that time, RVSP elevated at 40 mmHg.    Last TTE was performed on 10/26/2019 revealing normal left ventricular systolic function with mild LVH; LVEF >55%..  There were no regional wall motion abnormalities. Diastolic Doppler parameters consistent with pseudonormalization (G2DD).  There was mild biatrial and mild right ventricular enlargement.  Trivial aortic, mild mitral/pulmonic, and severe tricuspid valve regurgitation noted.  There was no evidence of a significant transvalvular gradient to suggest stenosis.  Of note, pulmonary hypertension had progressed to a severe degree; RVSP 100.4 mmHg.  Of note, pulmonary function felt to have worsened secondary to after effects of SARS-CoV-2 infection.  Patient developed SARS-CoV-2 pneumonia on 05/30/2019 and was hospitalized at [facility] emergency room [place] between 06/02/2019 and 06/25/2019.  Patient had a recurrence of his atrial fibrillation in the setting of his SARS-CoV-2 infection.  Patient was treated with IV amiodarone + metoprolol.  He underwent DCCV on 06/22/2019, whereby he received a single 200 J cardioversion, which restored NSR.  He has had no recurrences since.    Diagnostic right/left heart catheterization performed on 01/17/2020.  There was multivessel CAD; 25% ostial LAD, 65% ostial proximal RCA, 45% proximal LAD, 65% mid LAD, and 35% OM2.  Intervention was deferred opting  for medical management.  Mean PA pressure 52 mmHg, PCWP 25 mmHg, LVEDP 19 mmHg, RVEDP 25 mmHg, and mean RA  pressure 13 mmHg.  As previously mentioned, patient has an atrial fibrillation diagnosis; CHA2DS2-VASc Score = 6 (age x 2, HFpEF, HTN, vascular disease history, T2DM). His rate and rhythm are currently being maintained without the use of pharmacological interventions. He is chronically anticoagulated using standard dose apixaban; reported to be compliant with therapy with no evidence or reports of GI bleeding. Pulmonary hypertension being managed with a PDE5i (sildenafil) and vasodilator (treprostinil). Blood pressure well controlled at 120/64 on currently prescribed diuretic (furosemide) and ACEi (lisinopril) therapies. He is on a statin for his HLD diagnosis and further ASCVD prevention. T2DM suboptimally controlled on currently prescribed regimen; last HgbA1c was 8.4% when checked on 01/12/2022. Functional capacity, as defined by DASI, is documented as being </= 4 METS.  No changes were made to his medication regimen.  Patient to follow-up with outpatient cardiology in 1 year or sooner if needed.  Jay Morris is scheduled for an EXAM UNDER ANESTHESIA WITH HEMORRHOIDECTOMY on 02/12/2022 with Dr. Benjamine Sprague, DO.  Given patient's past medical history significant for cardiovascular/cardiopulmonary diagnoses, presurgical clearances were sought from patient's primary cardiology and pulmonary medicine providers.  Specialty clearances were obtained as follows .  Per pulmonary medicine Raul Del, MD), "this patient is optimized for surgery and may proceed with the planned procedural course with a MODERATE risk of significant perioperative cardiopulmonary complications".  Per cardiology Nehemiah Massed, MD), "the patient is at the lowest risk possible for perioperative cardiovascular complications with the planned procedure.  The overall risk his procedure is low (<1%).  Currently has no evidence active and/or  significant angina and/or congestive heart failure. Patient may proceed to surgery without restriction or need for further cardiovascular testing at an overall LOW risk".    As previously mentioned, patient is on daily anticoagulation therapy.  He has been instructed on recommendations from his cardiologist for holding his apixaban for 3 days prior to his procedure with plans to restart since postoperative bleeding risk felt to be minimized by his primary attending surgeon.  The patient is aware that his last dose of apixaban should be on 02/08/2022. Patient denies previous perioperative complications with anesthesia in the past. In review of the available records, it is noted that patient underwent a general anesthetic course here at [facility] (ASA III) in 07/2021 without documented complications.      02/03/2022    9:40 AM 02/01/2022   11:14 AM 01/21/2022   12:00 PM  Vitals with BMI  Height _0  _1    Weight 189 lbs 189 lbs   BMI 16.10 96.04   Systolic  540 981  Diastolic  61 63  Pulse  80 67    Providers/Specialists:   NOTE: Primary physician provider listed below. Patient may have been seen by APP or partner within same practice.   PROVIDER ROLE / SPECIALTY LAST Shirline Frees, DO General Surgery (Surgeon) 01/13/2022  [name], MD Primary Care Provider 01/12/2022  Serafina Royals, MD Cardiology 02/01/2022  Delight Hoh, MD Medical Oncology 12/22/2021  Lavone Orn, MD Endocrinology 01/18/2022   Allergies:  Shrimp [shellfish allergy] and Shrimp extract allergy skin test  Current Home Medications:   No current facility-administered medications for this encounter.    acetaminophen (TYLENOL) 500 MG tablet   albuterol (VENTOLIN HFA) 108 (90 Base) MCG/ACT inhaler   allopurinol (ZYLOPRIM) 100 MG tablet   ascorbic acid (VITAMIN C) 500 MG tablet   atorvastatin (LIPITOR) 20 MG tablet   BREO ELLIPTA 100-25 MCG/INH AEPB  Cholecalciferol (DIALYVITE VITAMIN D 5000) 125 MCG (5000 UT) capsule   colchicine 0.6 MG tablet   ELIQUIS 5 MG TABS tablet   finasteride (PROSCAR) 5 MG tablet   furosemide (LASIX) 80 MG tablet   insulin aspart (NOVOLOG) 100 UNIT/ML FlexPen   insulin degludec (TRESIBA FLEXTOUCH) 200 UNIT/ML FlexTouch Pen   lisinopril (ZESTRIL) 20 MG tablet   magnesium oxide (MAG-OX) 400 MG tablet   nystatin-triamcinolone ointment (MYCOLOG)   pantoprazole (PROTONIX) 40 MG tablet   sildenafil (REVATIO) 20 MG tablet   TYVASO REFILL 0.6 MG/ML SOLN   vitamin B-12 (CYANOCOBALAMIN) 1000 MCG tablet   History:   Past Medical History:  Diagnosis Date   (HFpEF) heart failure with preserved ejection fraction (HCC)    a.) TTE 04/01/2016: EF 60-65%, LAE, MAC, mild MR/AR/TR, mild-mod PR, G2DD; b.) TTE 12/18/2018: EF 55%, mild LVH, mild AR/MR/TR. mod PR; c.) TTE 05/31/2019: EF 60-65%, mild LVH, RAE/RVE, triv AR.MT, PR, mild TR; d.) TTE 10/26/2019: EF 55%, BAE, RVE, triv AR, mild MR/PR, sev TR, G2DD; e.) R/LHC 01/17/2020: mPA 52, PCWP 25, LVEDP 19, RVEDP 25, mRA 13   Anemia    Aortic atherosclerosis (HCC)    Asthma    BPH (benign prostatic hyperplasia)    CKD (chronic kidney disease), stage III (HCC)    COPD (chronic obstructive pulmonary disease) ([place])    Coronary artery disease 01/17/2020   a.) LHC 01/17/2020: 25% oLAD, 65% o-pRCA, 45% pLAD, 65% mLAD, 35% OM2 --> med mgmt.   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   PAF (paroxysmal atrial fibrillation) (HCC)    a.) CHA2DS2-VASc = 6 (age x 2, HFpEF, HTN, vascular disease, T2DM);  b.) cardiac ablation in approx 2014-2015 at  [facility]; c.) A.fib recurrence in setting of SARS-CoV-2 --> Tx'd with IV amiodarone + metoprolol + DCCV x 1 (200 J) 06/22/2019 restoring NSR; d.) rate/rhythm maintained without pharmacological intervention; chronically anticoagulated using standard dose apixaban   Pneumonia due to COVID-19 virus 06/02/2019   a.) hospitalized at [facility]) from 06/02/2019 - 06/25/2019; Tx'd with steriods + remdesivir + convalescent plasma   Pulmonary HTN ([place]) 04/01/2016   a.) TTE 04/01/2016: EF 60-65%, RVSP 40; b.) TTE 12/18/2018: EF 55%, RVSP 45.9; c.) TTE 05/31/2019: EF 60-65%, RVSP 35.5; d.) TTE 10/26/2019: EF 55%, RVSP 100.4; e.) R/LHC 01/17/2020: mPA 52, PCWP 25, LVEDP 19, RVEDP 25, mRA 13; f.) Tx with PDE5i (sildenafil) + vasodilator (treprostinil)   Type 2 diabetes mellitus treated with insulin ([place])    Past Surgical History:  Procedure Laterality Date   BACK SURGERY     x3 L3-L4    CARDIAC ELECTROPHYSIOLOGY MAPPING AND ABLATION N/A    performed at [place] in approximately 2014-2015   CARDIOVERSION N/A 06/22/2019   Procedure: CARDIOVERSION;  Surgeon: Fay Records, MD;  Location: [place];  Service: Cardiovascular;  Laterality: N/A;   COLONOSCOPY WITH PROPOFOL N/A 06/15/2017   Procedure: COLONOSCOPY WITH PROPOFOL;  Surgeon: [place], Benay Pike, MD;  Location: [facility] ENDOSCOPY;  Service: Gastroenterology;  Laterality: N/A;   ENTEROSCOPY N/A 11/24/2017   Procedure: ENTEROSCOPY;  Surgeon: Jonathon Bellows, MD;  Location:  [facility] ENDOSCOPY;  Service: Gastroenterology;  Laterality: N/A;   ESOPHAGOGASTRODUODENOSCOPY N/A 11/27/2017   Procedure: ESOPHAGOGASTRODUODENOSCOPY (EGD);  Surgeon: Lucilla Lame, MD;  Location: [facility] ENDOSCOPY;  Service: Endoscopy;  Laterality: N/A;   ESOPHAGOGASTRODUODENOSCOPY (EGD) WITH PROPOFOL N/A 06/02/2017   Procedure: ESOPHAGOGASTRODUODENOSCOPY (EGD) WITH PROPOFOL;  Surgeon: Toledo, Benay Pike, MD;  Location: [facility] ENDOSCOPY;  Service: Gastroenterology;  Laterality: N/A;    ESOPHAGOGASTRODUODENOSCOPY (EGD) WITH PROPOFOL N/A 08/04/2021   Procedure: ESOPHAGOGASTRODUODENOSCOPY (EGD) WITH PROPOFOL;  Surgeon: Lesly Rubenstein, MD;  Location: [facility] ENDOSCOPY;  Service: Endoscopy;  Laterality: N/A;   GIVENS CAPSULE STUDY N/A 11/25/2017   Procedure: GIVENS CAPSULE STUDY;  Surgeon: Jonathon Bellows, MD;  Location: [facility] ENDOSCOPY;  Service: Gastroenterology;  Laterality: N/A;   HAND SURGERY     RIGHT/LEFT HEART CATH AND CORONARY ANGIOGRAPHY N/A 01/17/2020   Procedure: RIGHT/LEFT HEART CATH AND CORONARY ANGIOGRAPHY;  Surgeon: Corey Skains, MD;  Location: [place] CV LAB;  Service: Cardiovascular;  Laterality: N/A;   Family History  Problem Relation Age of Onset   Heart disease Mother    Cancer Father    Cancer Paternal Grandfather    Social History   Tobacco Use   Smoking status: Former    Packs/day: 1.00    Years: 30.00    Total pack years: 30.00    Types: Cigarettes    Quit date: 1995    Years since quitting: 28.5   Smokeless tobacco: Never  Vaping Use   Vaping Use: Never used  Substance Use Topics   Alcohol use: No    Alcohol/week: 0.0 standard drinks of alcohol    Comment: occasional   Drug use: No    Pertinent Clinical Results:  LABS:   Component Date Value Ref Range Status   Hemoglobin 01/21/2022 9.0 (L)  13.0 - 17.0 g/dL Final   HCT 01/21/2022 28.1 (L)  39.0 - 52.0 % Final   Performed at [facility], [address] [place], [place] [address]    Ref Range & Units 01/12/2022  WBC (White Blood Cell Count) 4.1 - 10.2 10^3/uL 7.5   RBC (Red Blood Cell Count) 4.69 - 6.13 10^6/uL 2.93 Low    Hemoglobin 14.1 - 18.1 gm/dL 9.5 Low    Hematocrit 40.0 - 52.0 % 29.6 Low    MCV (Mean Corpuscular Volume) 80.0 - 100.0 fl 101.0 High    MCH (Mean Corpuscular Hemoglobin) 27.0 - 31.2 pg 32.4 High    MCHC (Mean Corpuscular Hemoglobin Concentration) 32.0 - 36.0 gm/dL 32.1   Platelet Count 150 - 450 10^3/uL 222   RDW-CV (Red Cell Distribution  Width) 11.6 - 14.8 % 15.5 High    MPV (Mean Platelet Volume) 9.4 - 12.4 fl 11.0   Neutrophils 1.50 - 7.80 10^3/uL 4.78   Lymphocytes 1.00 - 3.60 10^3/uL 1.81   Monocytes 0.00 - 1.50 10^3/uL 0.64   Eosinophils 0.00 - 0.55 10^3/uL 0.25   Basophils 0.00 - 0.09 10^3/uL 0.03   Neutrophil % 32.0 - 70.0 % 63.5   Lymphocyte % 10.0 - 50.0 % 24.0   Monocyte % 4.0 - 13.0 % 8.5   Eosinophil % 1.0 - 5.0 % 3.3   Basophil% 0.0 - 2.0 % 0.4   Immature Granulocyte % <=0.7 % 0.3   Immature Granulocyte Count <=0.06 10^3/L 0.02   Resulting Agency  [facility]    Ref Range & Units 01/12/2022  Glucose 70 - 110 mg/dL 255 High    Sodium 136 - 145 mmol/L 142  Potassium 3.6 - 5.1 mmol/L 4.4   Chloride 97 - 109 mmol/L 105   Carbon Dioxide (CO2) 22.0 - 32.0 mmol/L 25.3   Urea Nitrogen (BUN) 7 - 25 mg/dL 31 High    Creatinine 0.7 - 1.3 mg/dL 2.6 High    Glomerular Filtration Rate (eGFR), MDRD Estimate >60 mL/min/1.73sq m 24 Low    Calcium 8.7 - 10.3 mg/dL 8.7   AST  8 - 39 U/L 15   ALT  6 - 57 U/L 19   Alk Phos (alkaline Phosphatase) 34 - 104 U/L 38   Albumin 3.5 - 4.8 g/dL 4.1   Bilirubin, Total 0.3 - 1.2 mg/dL 0.6   Protein, Total 6.1 - 7.9 g/dL 6.3   A/G Ratio 1.0 - 5.0 gm/dL 1.9   Specimen Collected: 01/12/22 15:49   Performed by: [place]: 01/12/22 17:50  Received From: [place]  Result Received: 01/21/22 12:33    ECG: Date: 08/03/2021 Time ECG obtained: 0637 AM Rate: 108 bpm Rhythm:  Sinus tachycardia with occasional PVCs Axis (leads I and aVF): Normal Intervals: PR 166 ms. QRS 74 ms. QTc 450 ms. ST segment and T wave changes: Nonspecific ST abnormality. Comparison: Similar to previous tracing obtained on 01/17/2020   IMAGING / PROCEDURES: CT ABDOMEN PELVIS WO CONTRAST performed on 08/03/2021 Moderate to high-grade stool throughout all segments of the colon compatible with constipation. No acute abnormality is seen  within the abdomen or pelvis. Likely moderate chronic scarring within the lung bases. Mild cardiomegaly.   Aortic atherosclerosis.  RIGHT/LEFT HEART CATHETERIZATION AND CORONARY ANGIOGRAPHY performed on 01/17/2020 Normal left ventricular systolic function Multivessel CAD 25% stenosis of the ostial LAD 65% stenosis of the ostial to proximal RCA 45% stenosis of the proximal LAD 65% stenosis of the mid LAD 35% stenosis of the OM2 Hemodynamics LVEDP 19 mmHg RVEDP 25 mmHg Mean PA pressure 52 mmHg PCWP 25 mmHg Mean RAP 13 mmHg Recommendations Severe pulmonary hypertension possibly secondary to SARS-CoV-2 infection and pulmonary injury Aggressive medical management of risk factor modification for further risk for progression of CAD, including control of HTN, HLD, and antiplatelet therapy. Further treatment of severe pulmonary hypertension as per pulmonology No further cardiovascular intervention at this time Further consideration of stress test evaluation of any concerns of possible myocardial ischemia in the future   TRANSTHORACIC ECHOCARDIOGRAM performed on 05/31/2019 Left ventricular ejection fraction, by visual estimation, is 60 to 65%. The left ventricle has normal function. Left ventricular septal wall thickness was mildly increased. Mildly increased left ventricular posterior wall thickness. There is mildly increased left ventricular hypertrophy.  Global right ventricle has normal systolic function.The right ventricular size is mildly enlarged. No increase in right ventricular wall thickness.  Left atrial size was normal.  Right atrial size was mildly dilated.  The mitral valve was not well visualized. Trace mitral valve regurgitation.  The tricuspid valve is not well visualized. Tricuspid valve regurgitation is mild.  The aortic valve is grossly normal. Aortic valve regurgitation is trivial.  The pulmonic valve was not well visualized. Pulmonic valve regurgitation is trivial.   The aortic root was not well visualized.  Mildly elevated pulmonary artery systolic pressure.  The atrial septum is grossly normal.   Impression and Plan:  Jay Morris has been referred for pre-anesthesia review and clearance prior to him undergoing the planned anesthetic and procedural courses. Available labs, pertinent testing, and imaging results were personally reviewed by me. This patient has been appropriately cleared by cardiology (  LOW) and by pulmonary medicine (MODERATE) with the individually indicated risk of significant perioperative complications.  Based on clinical review performed today (02/09/22), barring any significant acute changes in the patient's overall condition, it is anticipated that he will be able to proceed with the planned surgical intervention. Any acute changes in clinical condition may necessitate his procedure being postponed and/or cancelled. Patient will meet with anesthesia team (MD and/or CRNA) on the day of his procedure for preoperative evaluation/assessment. Questions regarding anesthetic course will be fielded at that time.   Pre-surgical instructions were reviewed with the patient during his PAT appointment and questions were fielded by PAT clinical staff. Patient was advised that if any questions or concerns arise prior to his procedure then he should return a call to PAT and/or his surgeon's office to discuss.  Honor Loh, MSN, APRN, FNP-C, CEN [facility]  Peri-operative Services Nurse Practitioner Phone: [phone] Fax: [phone] 02/09/22 9:47 AM  NOTE: This note has been prepared using Dragon dictation software. Despite my best ability to proofread, there is always the potential that unintentional transcriptional errors may still occur from this process.

## 2022-02-12 ENCOUNTER — Ambulatory Visit: Payer: Medicare HMO | Admitting: Urgent Care

## 2022-02-12 ENCOUNTER — Emergency Department: Payer: Medicare HMO

## 2022-02-12 ENCOUNTER — Ambulatory Visit
Admission: RE | Admit: 2022-02-12 | Discharge: 2022-02-12 | Disposition: A | Discharge status: Home / Self Care | Payer: Medicare HMO | Source: Home / Self Care | Attending: Surgery | Admitting: Surgery

## 2022-02-12 ENCOUNTER — Other Ambulatory Visit: Payer: Self-pay

## 2022-02-12 ENCOUNTER — Encounter: Payer: Self-pay | Admitting: Surgery

## 2022-02-12 ENCOUNTER — Encounter
Admission: RE | Disposition: A | Discharge status: Home / Self Care | Payer: Self-pay | Source: Home / Self Care | Attending: Surgery

## 2022-02-12 ENCOUNTER — Inpatient Hospital Stay
Admission: EM | Admit: 2022-02-12 | Discharge: 2022-02-19 | DRG: 853 | Disposition: A | Discharge status: Home / Self Care | Payer: Medicare HMO | Attending: Family Medicine | Admitting: Family Medicine

## 2022-02-12 DIAGNOSIS — D631 Anemia in chronic kidney disease: Secondary | ICD-10-CM | POA: Diagnosis present

## 2022-02-12 DIAGNOSIS — Z8619 Personal history of other infectious and parasitic diseases: Secondary | ICD-10-CM

## 2022-02-12 DIAGNOSIS — Z9981 Dependence on supplemental oxygen: Secondary | ICD-10-CM | POA: Insufficient documentation

## 2022-02-12 DIAGNOSIS — J449 Chronic obstructive pulmonary disease, unspecified: Secondary | ICD-10-CM | POA: Insufficient documentation

## 2022-02-12 DIAGNOSIS — Z833 Family history of diabetes mellitus: Secondary | ICD-10-CM

## 2022-02-12 DIAGNOSIS — I2510 Atherosclerotic heart disease of native coronary artery without angina pectoris: Secondary | ICD-10-CM | POA: Insufficient documentation

## 2022-02-12 DIAGNOSIS — A419 Sepsis, unspecified organism: Secondary | ICD-10-CM | POA: Diagnosis not present

## 2022-02-12 DIAGNOSIS — N184 Chronic kidney disease, stage 4 (severe): Secondary | ICD-10-CM | POA: Diagnosis present

## 2022-02-12 DIAGNOSIS — E1122 Type 2 diabetes mellitus with diabetic chronic kidney disease: Secondary | ICD-10-CM

## 2022-02-12 DIAGNOSIS — I130 Hypertensive heart and chronic kidney disease with heart failure and stage 1 through stage 4 chronic kidney disease, or unspecified chronic kidney disease: Secondary | ICD-10-CM | POA: Insufficient documentation

## 2022-02-12 DIAGNOSIS — E785 Hyperlipidemia, unspecified: Secondary | ICD-10-CM | POA: Diagnosis present

## 2022-02-12 DIAGNOSIS — K643 Fourth degree hemorrhoids: Secondary | ICD-10-CM | POA: Diagnosis present

## 2022-02-12 DIAGNOSIS — Z794 Long term (current) use of insulin: Secondary | ICD-10-CM

## 2022-02-12 DIAGNOSIS — K219 Gastro-esophageal reflux disease without esophagitis: Secondary | ICD-10-CM | POA: Insufficient documentation

## 2022-02-12 DIAGNOSIS — N400 Enlarged prostate without lower urinary tract symptoms: Secondary | ICD-10-CM

## 2022-02-12 DIAGNOSIS — Z87891 Personal history of nicotine dependence: Secondary | ICD-10-CM

## 2022-02-12 DIAGNOSIS — R6520 Severe sepsis without septic shock: Secondary | ICD-10-CM | POA: Diagnosis present

## 2022-02-12 DIAGNOSIS — I5032 Chronic diastolic (congestive) heart failure: Secondary | ICD-10-CM | POA: Insufficient documentation

## 2022-02-12 DIAGNOSIS — Z77090 Contact with and (suspected) exposure to asbestos: Secondary | ICD-10-CM | POA: Diagnosis present

## 2022-02-12 DIAGNOSIS — R0602 Shortness of breath: Secondary | ICD-10-CM | POA: Insufficient documentation

## 2022-02-12 DIAGNOSIS — Z8719 Personal history of other diseases of the digestive system: Secondary | ICD-10-CM

## 2022-02-12 DIAGNOSIS — J9621 Acute and chronic respiratory failure with hypoxia: Secondary | ICD-10-CM | POA: Diagnosis present

## 2022-02-12 DIAGNOSIS — Y95 Nosocomial condition: Secondary | ICD-10-CM | POA: Diagnosis present

## 2022-02-12 DIAGNOSIS — M109 Gout, unspecified: Secondary | ICD-10-CM | POA: Diagnosis present

## 2022-02-12 DIAGNOSIS — I272 Pulmonary hypertension, unspecified: Secondary | ICD-10-CM | POA: Insufficient documentation

## 2022-02-12 DIAGNOSIS — Z8616 Personal history of COVID-19: Secondary | ICD-10-CM | POA: Insufficient documentation

## 2022-02-12 DIAGNOSIS — Z7901 Long term (current) use of anticoagulants: Secondary | ICD-10-CM

## 2022-02-12 DIAGNOSIS — K623 Rectal prolapse: Secondary | ICD-10-CM | POA: Insufficient documentation

## 2022-02-12 DIAGNOSIS — L918 Other hypertrophic disorders of the skin: Secondary | ICD-10-CM | POA: Insufficient documentation

## 2022-02-12 DIAGNOSIS — I480 Paroxysmal atrial fibrillation: Secondary | ICD-10-CM | POA: Insufficient documentation

## 2022-02-12 DIAGNOSIS — J690 Pneumonitis due to inhalation of food and vomit: Secondary | ICD-10-CM | POA: Diagnosis present

## 2022-02-12 DIAGNOSIS — Z91013 Allergy to seafood: Secondary | ICD-10-CM

## 2022-02-12 DIAGNOSIS — Z8249 Family history of ischemic heart disease and other diseases of the circulatory system: Secondary | ICD-10-CM

## 2022-02-12 DIAGNOSIS — Z79899 Other long term (current) drug therapy: Secondary | ICD-10-CM

## 2022-02-12 DIAGNOSIS — K644 Residual hemorrhoidal skin tags: Secondary | ICD-10-CM | POA: Diagnosis present

## 2022-02-12 DIAGNOSIS — Z20822 Contact with and (suspected) exposure to covid-19: Secondary | ICD-10-CM | POA: Diagnosis present

## 2022-02-12 DIAGNOSIS — J189 Pneumonia, unspecified organism: Secondary | ICD-10-CM | POA: Diagnosis present

## 2022-02-12 DIAGNOSIS — K641 Second degree hemorrhoids: Secondary | ICD-10-CM | POA: Diagnosis present

## 2022-02-12 DIAGNOSIS — D472 Monoclonal gammopathy: Secondary | ICD-10-CM | POA: Diagnosis present

## 2022-02-12 DIAGNOSIS — E1165 Type 2 diabetes mellitus with hyperglycemia: Secondary | ICD-10-CM | POA: Diagnosis present

## 2022-02-12 DIAGNOSIS — I700 Atherosclerosis of aorta: Secondary | ICD-10-CM | POA: Diagnosis present

## 2022-02-12 DIAGNOSIS — N183 Chronic kidney disease, stage 3 unspecified: Secondary | ICD-10-CM | POA: Insufficient documentation

## 2022-02-12 DIAGNOSIS — I4892 Unspecified atrial flutter: Secondary | ICD-10-CM | POA: Diagnosis present

## 2022-02-12 DIAGNOSIS — I471 Supraventricular tachycardia: Secondary | ICD-10-CM | POA: Diagnosis present

## 2022-02-12 DIAGNOSIS — I10 Essential (primary) hypertension: Secondary | ICD-10-CM | POA: Diagnosis present

## 2022-02-12 DIAGNOSIS — J8410 Pulmonary fibrosis, unspecified: Secondary | ICD-10-CM | POA: Diagnosis present

## 2022-02-12 DIAGNOSIS — E1129 Type 2 diabetes mellitus with other diabetic kidney complication: Secondary | ICD-10-CM | POA: Diagnosis present

## 2022-02-12 HISTORY — DX: Unspecified hemorrhoids: K64.9

## 2022-02-12 HISTORY — DX: Chronic kidney disease, stage 3 unspecified: N18.30

## 2022-02-12 HISTORY — DX: Type 2 diabetes mellitus without complications: E11.9

## 2022-02-12 HISTORY — DX: Atherosclerosis of aorta: I70.0

## 2022-02-12 HISTORY — DX: Pulmonary fibrosis, unspecified: J84.10

## 2022-02-12 HISTORY — DX: Long term (current) use of insulin: Z79.4

## 2022-02-12 HISTORY — DX: Paroxysmal atrial fibrillation: I48.0

## 2022-02-12 HISTORY — DX: Gout, unspecified: M10.9

## 2022-02-12 HISTORY — DX: Contact with and (suspected) exposure to asbestos: Z77.090

## 2022-02-12 HISTORY — DX: Other intervertebral disc degeneration, lumbar region without mention of lumbar back pain or lower extremity pain: M51.369

## 2022-02-12 HISTORY — DX: Unspecified diastolic (congestive) heart failure: I50.30

## 2022-02-12 HISTORY — PX: EVALUATION UNDER ANESTHESIA WITH HEMORRHOIDECTOMY: SHX5624

## 2022-02-12 HISTORY — DX: Long term (current) use of anticoagulants: Z79.01

## 2022-02-12 HISTORY — DX: Gastro-esophageal reflux disease without esophagitis: K21.9

## 2022-02-12 HISTORY — DX: Other intervertebral disc degeneration, lumbar region: M51.36

## 2022-02-12 LAB — CBC WITH DIFFERENTIAL/PLATELET
Abs Immature Granulocytes: 0.08 10*3/uL — ABNORMAL HIGH (ref 0.00–0.07)
Basophils Absolute: 0.1 10*3/uL (ref 0.0–0.1)
Basophils Relative: 0 %
Eosinophils Absolute: 0 10*3/uL (ref 0.0–0.5)
Eosinophils Relative: 0 %
HCT: 30.7 % — ABNORMAL LOW (ref 39.0–52.0)
Hemoglobin: 9.6 g/dL — ABNORMAL LOW (ref 13.0–17.0)
Immature Granulocytes: 0 %
Lymphocytes Relative: 4 %
Lymphs Abs: 0.8 10*3/uL (ref 0.7–4.0)
MCH: 31.3 pg (ref 26.0–34.0)
MCHC: 31.3 g/dL (ref 30.0–36.0)
MCV: 100 fL (ref 80.0–100.0)
Monocytes Absolute: 1 10*3/uL (ref 0.1–1.0)
Monocytes Relative: 5 %
Neutro Abs: 17.5 10*3/uL — ABNORMAL HIGH (ref 1.7–7.7)
Neutrophils Relative %: 91 %
Platelets: 229 10*3/uL (ref 150–400)
RBC: 3.07 MIL/uL — ABNORMAL LOW (ref 4.22–5.81)
RDW: 15.4 % (ref 11.5–15.5)
WBC: 19.4 10*3/uL — ABNORMAL HIGH (ref 4.0–10.5)
nRBC: 0 % (ref 0.0–0.2)

## 2022-02-12 LAB — URINALYSIS, COMPLETE (UACMP) WITH MICROSCOPIC
Bacteria, UA: NONE SEEN
Bilirubin Urine: NEGATIVE
Glucose, UA: NEGATIVE mg/dL
Ketones, ur: NEGATIVE mg/dL
Leukocytes,Ua: NEGATIVE
Nitrite: NEGATIVE
Protein, ur: 100 mg/dL — AB
Specific Gravity, Urine: 1.009 (ref 1.005–1.030)
Squamous Epithelial / HPF: NONE SEEN (ref 0–5)
pH: 7 (ref 5.0–8.0)

## 2022-02-12 LAB — PROTIME-INR
INR: 1.2 (ref 0.8–1.2)
Prothrombin Time: 14.6 seconds (ref 11.4–15.2)

## 2022-02-12 LAB — COMPREHENSIVE METABOLIC PANEL
ALT: 21 U/L (ref 0–44)
AST: 23 U/L (ref 15–41)
Albumin: 3.9 g/dL (ref 3.5–5.0)
Alkaline Phosphatase: 34 U/L — ABNORMAL LOW (ref 38–126)
Anion gap: 10 (ref 5–15)
BUN: 26 mg/dL — ABNORMAL HIGH (ref 8–23)
CO2: 25 mmol/L (ref 22–32)
Calcium: 8.6 mg/dL — ABNORMAL LOW (ref 8.9–10.3)
Chloride: 105 mmol/L (ref 98–111)
Creatinine, Ser: 2.19 mg/dL — ABNORMAL HIGH (ref 0.61–1.24)
GFR, Estimated: 30 mL/min — ABNORMAL LOW (ref >60)
Glucose, Bld: 103 mg/dL — ABNORMAL HIGH (ref 70–99)
Potassium: 3.9 mmol/L (ref 3.5–5.1)
Sodium: 140 mmol/L (ref 135–145)
Total Bilirubin: 1.1 mg/dL (ref 0.3–1.2)
Total Protein: 6.8 g/dL (ref 6.5–8.1)

## 2022-02-12 LAB — APTT: aPTT: 26 seconds (ref 24–36)

## 2022-02-12 LAB — GLUCOSE, CAPILLARY
Glucose-Capillary: 163 mg/dL — ABNORMAL HIGH (ref 70–99)
Glucose-Capillary: 185 mg/dL — ABNORMAL HIGH (ref 70–99)

## 2022-02-12 LAB — SARS CORONAVIRUS 2 BY RT PCR: SARS Coronavirus 2 by RT PCR: NEGATIVE

## 2022-02-12 LAB — LACTIC ACID, PLASMA: Lactic Acid, Venous: 2.7 mmol/L (ref 0.5–1.9)

## 2022-02-12 SURGERY — EXAM UNDER ANESTHESIA WITH HEMORRHOIDECTOMY
Anesthesia: General

## 2022-02-12 MED ORDER — LIDOCAINE 5 % EX OINT: 1.0000 | TOPICAL_OINTMENT | Freq: Three times a day (TID) | CUTANEOUS | 0 refills | Status: DC | PRN

## 2022-02-12 MED ORDER — ACETAMINOPHEN 500 MG PO TABS
ORAL_TABLET | ORAL | Status: AC
  Administered 2022-02-12: 1000 mg via ORAL
  Filled 2022-02-12: qty 2

## 2022-02-12 MED ORDER — VASOPRESSIN 20 UNIT/ML IV SOLN
INTRAVENOUS | Status: DC | PRN
  Administered 2022-02-12 (×2): 1 [IU] via INTRAVENOUS

## 2022-02-12 MED ORDER — SODIUM CHLORIDE 0.9 % IV SOLN: INTRAVENOUS | Status: DC

## 2022-02-12 MED ORDER — CELECOXIB 200 MG PO CAPS: 200.0000 mg | ORAL_CAPSULE | ORAL | Status: AC

## 2022-02-12 MED ORDER — VASOPRESSIN 20 UNITS/100 ML INFUSION FOR SHOCK
INTRAVENOUS | Status: DC | PRN
  Administered 2022-02-12 (×2): .013 [IU]/min via INTRAVENOUS

## 2022-02-12 MED ORDER — FENTANYL CITRATE (PF) 100 MCG/2ML IJ SOLN
INTRAMUSCULAR | Status: AC
  Filled 2022-02-12: qty 2

## 2022-02-12 MED ORDER — METRONIDAZOLE 500 MG/100ML IV SOLN
500.0000 mg | Freq: Once | INTRAVENOUS | Status: AC
  Administered 2022-02-12: 500 mg via INTRAVENOUS
  Filled 2022-02-12: qty 100

## 2022-02-12 MED ORDER — ONDANSETRON HCL 4 MG/2ML IJ SOLN
INTRAMUSCULAR | Status: AC
  Filled 2022-02-12: qty 2

## 2022-02-12 MED ORDER — FENTANYL CITRATE (PF) 100 MCG/2ML IJ SOLN: 25.0000 ug | INTRAMUSCULAR | Status: DC | PRN

## 2022-02-12 MED ORDER — BUPIVACAINE-EPINEPHRINE (PF) 0.5% -1:200000 IJ SOLN
INTRAMUSCULAR | Status: DC | PRN
  Administered 2022-02-12: 30 mL

## 2022-02-12 MED ORDER — BUPIVACAINE LIPOSOME 1.3 % IJ SUSP
INTRAMUSCULAR | Status: AC
  Filled 2022-02-12: qty 20

## 2022-02-12 MED ORDER — GELATIN ABSORBABLE 100 CM EX MISC
CUTANEOUS | Status: AC
Start: 2022-02-12 — End: ?
  Filled 2022-02-12: qty 1

## 2022-02-12 MED ORDER — FENTANYL CITRATE (PF) 100 MCG/2ML IJ SOLN
INTRAMUSCULAR | Status: DC | PRN
  Administered 2022-02-12: 100 ug via INTRAVENOUS

## 2022-02-12 MED ORDER — CHLORHEXIDINE GLUCONATE CLOTH 2 % EX PADS
6.0000 | MEDICATED_PAD | Freq: Once | CUTANEOUS | Status: AC
  Administered 2022-02-12: 6 via TOPICAL

## 2022-02-12 MED ORDER — VANCOMYCIN HCL IN DEXTROSE 1-5 GM/200ML-% IV SOLN
1000.0000 mg | Freq: Once | INTRAVENOUS | Status: AC
  Administered 2022-02-12: 1000 mg via INTRAVENOUS
  Filled 2022-02-12: qty 200

## 2022-02-12 MED ORDER — ACETAMINOPHEN 500 MG PO TABS: 1000.0000 mg | ORAL_TABLET | ORAL | Status: AC

## 2022-02-12 MED ORDER — KETAMINE HCL 10 MG/ML IJ SOLN
INTRAMUSCULAR | Status: DC | PRN
  Administered 2022-02-12: 20 mg via INTRAVENOUS

## 2022-02-12 MED ORDER — LIDOCAINE HCL (PF) 2 % IJ SOLN
INTRAMUSCULAR | Status: AC
  Filled 2022-02-12: qty 5

## 2022-02-12 MED ORDER — ORAL CARE MOUTH RINSE: 15.0000 mL | Freq: Once | OROMUCOSAL | Status: AC

## 2022-02-12 MED ORDER — GABAPENTIN 300 MG PO CAPS
ORAL_CAPSULE | ORAL | Status: AC
  Administered 2022-02-12: 300 mg via ORAL
  Filled 2022-02-12: qty 1

## 2022-02-12 MED ORDER — OXYCODONE HCL 5 MG PO TABS: 5.0000 mg | ORAL_TABLET | Freq: Once | ORAL | Status: DC | PRN

## 2022-02-12 MED ORDER — ONDANSETRON HCL 4 MG/2ML IJ SOLN
INTRAMUSCULAR | Status: DC | PRN
  Administered 2022-02-12: 4 mg via INTRAVENOUS

## 2022-02-12 MED ORDER — SODIUM CHLORIDE 0.9 % IV SOLN
2.0000 g | Freq: Once | INTRAVENOUS | Status: AC
  Administered 2022-02-12: 2 g via INTRAVENOUS
  Filled 2022-02-12: qty 12.5

## 2022-02-12 MED ORDER — GABAPENTIN 300 MG PO CAPS: 300.0000 mg | ORAL_CAPSULE | ORAL | Status: AC

## 2022-02-12 MED ORDER — ONDANSETRON HCL 4 MG/2ML IJ SOLN: 4.0000 mg | Freq: Once | INTRAMUSCULAR | Status: DC | PRN

## 2022-02-12 MED ORDER — CELECOXIB 200 MG PO CAPS
ORAL_CAPSULE | ORAL | Status: AC
  Administered 2022-02-12: 200 mg via ORAL
  Filled 2022-02-12: qty 1

## 2022-02-12 MED ORDER — KETAMINE HCL 50 MG/5ML IJ SOSY
PREFILLED_SYRINGE | INTRAMUSCULAR | Status: AC
  Filled 2022-02-12: qty 5

## 2022-02-12 MED ORDER — OXYCODONE HCL 5 MG PO TABS: 5.0000 mg | ORAL_TABLET | Freq: Three times a day (TID) | ORAL | 0 refills | Status: DC | PRN

## 2022-02-12 MED ORDER — CHLORHEXIDINE GLUCONATE 0.12 % MT SOLN: 15.0000 mL | Freq: Once | OROMUCOSAL | Status: AC

## 2022-02-12 MED ORDER — LIDOCAINE HCL (CARDIAC) PF 100 MG/5ML IV SOSY
PREFILLED_SYRINGE | INTRAVENOUS | Status: DC | PRN
  Administered 2022-02-12: 100 mg via INTRAVENOUS

## 2022-02-12 MED ORDER — CHLORHEXIDINE GLUCONATE 0.12 % MT SOLN
OROMUCOSAL | Status: AC
  Administered 2022-02-12: 15 mL via OROMUCOSAL
  Filled 2022-02-12: qty 15

## 2022-02-12 MED ORDER — BUPIVACAINE-EPINEPHRINE (PF) 0.5% -1:200000 IJ SOLN
INTRAMUSCULAR | Status: AC
  Filled 2022-02-12: qty 30

## 2022-02-12 MED ORDER — PROPOFOL 10 MG/ML IV BOLUS
INTRAVENOUS | Status: DC | PRN
  Administered 2022-02-12: 80 mg via INTRAVENOUS

## 2022-02-12 MED ORDER — VASOPRESSIN 20 UNIT/ML IV SOLN
INTRAVENOUS | Status: AC
  Filled 2022-02-12: qty 1

## 2022-02-12 MED ORDER — BUPIVACAINE LIPOSOME 1.3 % IJ SUSP
INTRAMUSCULAR | Status: DC | PRN
  Administered 2022-02-12: 20 mL

## 2022-02-12 MED ORDER — LACTATED RINGERS IV BOLUS
1000.0000 mL | Freq: Once | INTRAVENOUS | Status: AC
  Administered 2022-02-12: 1000 mL via INTRAVENOUS

## 2022-02-12 MED ORDER — OXYCODONE HCL 5 MG/5ML PO SOLN: 5.0000 mg | Freq: Once | ORAL | Status: DC | PRN

## 2022-02-12 MED ORDER — PROPOFOL 10 MG/ML IV BOLUS
INTRAVENOUS | Status: AC
  Filled 2022-02-12: qty 20

## 2022-02-12 SURGICAL SUPPLY — 35 items
BLADE SURG 15 STRL LF DISP TIS (BLADE) ×1 IMPLANT
BLADE SURG 15 STRL SS (BLADE) ×2
DRAPE PERI LITHO V/GYN (MISCELLANEOUS) ×2 IMPLANT
DRAPE UNDER BUTTOCK W/FLU (DRAPES) ×2 IMPLANT
DRSG GAUZE FLUFF 36X18 (GAUZE/BANDAGES/DRESSINGS) ×2 IMPLANT
ELECT REM PT RETURN 9FT ADLT (ELECTROSURGICAL) ×2
ELECTRODE REM PT RTRN 9FT ADLT (ELECTROSURGICAL) ×1 IMPLANT
GAUZE 4X4 16PLY ~~LOC~~+RFID DBL (SPONGE) ×2 IMPLANT
GLOVE BIOGEL PI IND STRL 7.0 (GLOVE) ×1 IMPLANT
GLOVE BIOGEL PI INDICATOR 7.0 (GLOVE) ×1
GLOVE SURG SYN 6.5 ES PF (GLOVE) ×6 IMPLANT
GLOVE SURG SYN 6.5 PF PI (GLOVE) ×1 IMPLANT
GOWN STRL REUS W/ TWL LRG LVL3 (GOWN DISPOSABLE) ×2 IMPLANT
GOWN STRL REUS W/TWL LRG LVL3 (GOWN DISPOSABLE) ×4
KIT TURNOVER CYSTO (KITS) ×2 IMPLANT
LABEL OR SOLS (LABEL) ×2 IMPLANT
MANIFOLD NEPTUNE II (INSTRUMENTS) ×2 IMPLANT
NDL HYPO 25X1 1.5 SAFETY (NEEDLE) ×1 IMPLANT
NEEDLE HYPO 22GX1.5 SAFETY (NEEDLE) ×2 IMPLANT
NEEDLE HYPO 25X1 1.5 SAFETY (NEEDLE) ×2 IMPLANT
NS IRRIG 500ML POUR BTL (IV SOLUTION) ×2 IMPLANT
PACK BASIN MINOR ARMC (MISCELLANEOUS) ×2 IMPLANT
PAD PREP 24X41 OB/GYN DISP (PERSONAL CARE ITEMS) ×1 IMPLANT
PANTS MESH DISP 2XL (UNDERPADS AND DIAPERS) ×2 IMPLANT
SHEARS HARMONIC 9CM CVD (BLADE) ×1 IMPLANT
SOL PREP PVP 2OZ (MISCELLANEOUS) ×2
SOLUTION PREP PVP 2OZ (MISCELLANEOUS) ×1 IMPLANT
SURGILUBE 2OZ TUBE FLIPTOP (MISCELLANEOUS) ×2 IMPLANT
SUT SILK 3 0 (SUTURE) ×2
SUT SILK 3-0 18XBRD TIE 12 (SUTURE) IMPLANT
SUT VIC AB 3-0 SH 27 (SUTURE) ×2
SUT VIC AB 3-0 SH 27X BRD (SUTURE) ×1 IMPLANT
SWAB CULTURE AMIES ANAERIB BLU (MISCELLANEOUS) IMPLANT
SYR 10ML LL (SYRINGE) ×2 IMPLANT
SYR 20ML LL LF (SYRINGE) ×2 IMPLANT

## 2022-02-12 NOTE — Anesthesia Preprocedure Evaluation (Signed)
Anesthesia Evaluation  Patient identified by MRN, date of birth, ID band Patient awake    Reviewed: Allergy & Precautions, NPO status , Patient's Chart, lab work & pertinent test results  History of Anesthesia Complications Negative for: history of anesthetic complications  Airway Mallampati: II  TM Distance: >3 FB Neck ROM: Full    Dental  (+) Poor Dentition, Dental Advidsory Given, Upper Dentures, Chipped   Pulmonary shortness of breath, with exertion and Long-Term Oxygen Therapy, asthma , COPD,  COPD inhaler and oxygen dependent, neg recent URI, former smoker,  3L/min oxygen continuously. Took inhalers today. Breathing feels at baseline   - rhonchi + decreased breath sounds(-) wheezing      Cardiovascular Exercise Tolerance: Poor hypertension, Pt. on medications pulmonary hypertension(-) angina+ Peripheral Vascular Disease and +CHF  (-) CAD, (-) Past MI, (-) Cardiac Stents and (-) CABG + dysrhythmias Atrial Fibrillation + Valvular Problems/Murmurs  Rhythm:Regular Rate:Normal - Systolic murmurs and - Diastolic murmurs Severe pulm HTN, takes inhaled prostaglandin. ] Cath 2021: Assessment Patient with severe shortness of breath with severe pulmonary hypertension possibly secondary to Covid infection and pulmonary injury and normal LV systolic function with moderate three-vessel coronary atherosclerosis  Plan Aggressive medical management of risk factor modification for further risk reduction of progression of coronary atherosclerosis including hypertension control, high intensity cholesterol therapy, and antiplatelet therapy Further treatment of severe pulmonary hypertension as per pulmonology No further cardiac intervention at this time Further consideration of stress test evaluation if any concerns of possible myocardial ischemia in the future    Neuro/Psych negative neurological ROS  negative psych ROS   GI/Hepatic Neg  liver ROS, GERD  Controlled,GIB   Endo/Other  diabetes, Insulin Dependent  Renal/GU CRFRenal disease     Musculoskeletal negative musculoskeletal ROS (+)   Abdominal   Peds  Hematology  (+) Blood dyscrasia, anemia ,   Anesthesia Other Findings Past Medical History: No date: Asthma No date: Atrial fibrillation (HCC) No date: BPH (benign prostatic hyperplasia) No date: COPD (chronic obstructive pulmonary disease) (HCC) No date: Diabetes mellitus without complication (HCC) No date: Helicobacter pylori gastritis No date: Hyperlipidemia No date: Hypertension No date: Pneumonia   Reproductive/Obstetrics                             Anesthesia Physical  Anesthesia Plan  ASA: 4  Anesthesia Plan: General   Post-op Pain Management: Tylenol PO (pre-op)*, Gabapentin PO (pre-op)* and Celebrex PO (pre-op)*   Induction: Intravenous  PONV Risk Score and Plan: 2 and Propofol infusion, TIVA and Ondansetron  Airway Management Planned: Natural Airway  Additional Equipment: None  Intra-op Plan:   Post-operative Plan: Extubation in OR  Informed Consent: I have reviewed the patients History and Physical, chart, labs and discussed the procedure including the risks, benefits and alternatives for the proposed anesthesia with the patient or authorized representative who has indicated his/her understanding and acceptance.     Dental advisory given  Plan Discussed with: CRNA and Anesthesiologist  Anesthesia Plan Comments: (Natural airway vs LMA.  Discussed risks of anesthesia with patient, including PONV, sore throat, lip/dental/eye damage. Rare risks discussed as well, such as cardiorespiratory and neurological sequelae, and allergic reactions. Discussed the role of CRNA in patient's perioperative care. Patient understands. Patient counseled on being higher risk for anesthesia due to comorbidities: O2-dependent COPD, severe pulm HTN. Patient was told about  increased risk of cardiac and respiratory events, including death. )  Anesthesia Quick Evaluation

## 2022-02-12 NOTE — ED Provider Notes (Signed)
[facility] Provider Note    Event Date/Time   First MD Initiated Contact with Patient 02/12/22 2206     (approximate)   History   Fever (Patient C/O fever, nausea that began today. He self-cath's and was positive for yeast in the urine two weeks ago)   HPI {Remember to add pertinent medical, surgical, social, and/or OB history to HPI:1} Jay Morris is a 76 y.o. male  ***       Physical Exam   Triage Vital Signs: ED Triage Vitals  Enc Vitals Group     BP 02/12/22 2200 (!) 184/96     Pulse Rate 02/12/22 2200 (!) 132     Resp 02/12/22 2200 (!) 29     Temp 02/12/22 2200 (!) 103.2 F (39.6 C)     Temp Source 02/12/22 2200 Oral     SpO2 02/12/22 2200 95 %     Weight 02/12/22 2204 182 lb 1.6 oz (82.6 kg)     Height --      Head Circumference --      Peak Flow --      Pain Score --      Pain Loc --      Pain Edu? --      Excl. in [place]? --     Most recent vital signs: Vitals:   02/12/22 2200  BP: (!) 184/96  Pulse: (!) 132  Resp: (!) 29  Temp: (!) 103.2 F (39.6 C)  SpO2: 95%    {Only need to document appropriate and relevant physical exam:1} General: Awake, no distress. *** CV:  Good peripheral perfusion. *** Resp:  Normal effort. *** Abd:  No distention. *** Other:  ***   ED Results / Procedures / Treatments   Labs (all labs ordered are listed, but only abnormal results are displayed) Labs Reviewed - No data to display   EKG  ***   RADIOLOGY *** {USE THE WORD "INTERPRETED"!! You MUST document your own interpretation of imaging, as well as the fact that you reviewed the radiologist's report!:1}   PROCEDURES:  Critical Care performed: {CriticalCareYesNo:19197::"Yes, see critical care procedure note(s)","No"}  Procedures   MEDICATIONS ORDERED IN ED: Medications - No data to display   IMPRESSION / MDM / [place] / ED COURSE  I reviewed the triage vital signs and the nursing notes.                               Differential diagnosis includes, but is not limited to, ***  Patient's presentation is most consistent with {EM COPA:27473}  {If the patient is on the monitor, remove the brackets and asterisks on the sentence below and remember to document it as a Procedure as well. Otherwise delete the sentence below:1} {**The patient is on the cardiac monitor to evaluate for evidence of arrhythmia and/or significant heart rate changes.**} {Remember to include, when applicable, any/all of the following data: independent review of imaging independent review of labs (comment specifically on pertinent positives and negatives) review of specific prior hospitalizations, PCP/specialist notes, etc. discuss meds given and prescribed document any discussion with consultants (including hospitalists) any clinical decision tools you used and why (PECARN, NEXUS, etc.) did you consider admitting the patient? document social determinants of health affecting patient's care (homelessness, inability to follow up in a timely fashion, etc) document any pre-existing conditions increasing risk on current visit (e.g. diabetes and HTN increasing danger of high-risk  chest pain/ACS) describes what meds you gave (especially parenteral) and why any other interventions?:1}     FINAL CLINICAL IMPRESSION(S) / ED DIAGNOSES   Final diagnoses:  None     Rx / DC Orders   ED Discharge Orders     None        Note:  This document was prepared using Dragon voice recognition software and may include unintentional dictation errors.

## 2022-02-12 NOTE — Op Note (Signed)
Preoperative diagnosis: fourth degree hemorrhoids, skin tags  Postoperative diagnosis: fourth degree hemorrhoids, rectal prolapse  Procedure: exam under anesthesia, one column hemorrhoidectomy and skin tag removal  Surgeon: Lysle Pearl  Anesthesia: general  Specimen: hemorrhoid and skin tag  Complications: none  EBL: 43m  Wound classification: Clean Contaminated  Indications: Patient is a 76y.o. male was found to have symptomatic hemorrhoids refractory to medical management.   Findings: 1. fourth  degree hemorrhoids with associated skin tag, rectal prolapse 2. Internal and external anal sphincter palpated and preserved 3. Adequate hemostasis  Description of procedure: The patient was brought to the operating room and general anesthesia was induced. Patient was placed high lithotomy position. A time-out was completed verifying correct patient, procedure, site, positioning, and implant(s) and/or special equipment prior to beginning this procedure. The perineum was prepped and draped in standard sterile fashion. Local anesthetic was injected as a perianal block. External exam notable for a component of rectal prolapse with associated hemorrhoids and external skin tags.  One column hemorrhoid on LL portion with signs of recent ulcreation and bleeding, and noted to start bleeding with gentle manipulation during exam.  An anoscope was introduced and confirmed no additional findings deep to the external exam.  Hemorrhoids noted on right side was well, but due to the prolapse component decision made to proceed with left column first.  A harmonic was placed across the base of the left pedicle and associated skin tage.  Excess tissue removed.  Specimen was passed off operative field pending pathology. Pulsatile very small but brisk bleeding artery noted at base of wound, specifically clamped and then suture ligated with 3-0 silk.   3-0 Vicryl then used to close the open wound, in a running fashion, over  the silk.  Inspection of the right side hemorrhoid showed no evidence of bleeding, and due to the prolapse component, decided to forego excision due to the bleeding issue encountered on the left.  Last inspection of the anal canal did not note any additional pathology, no bleeding.  Exparel injected as a perianal block. Surgifoam placed in rectal vault.  A gauze pad was tucked between the gluteal folds, and secured in place with mesh underwear.  The patient tolerated the procedure well and was taken to the postanesthesia care unit in stable condition.  Sponge and instrument count correct at end of procedure.

## 2022-02-12 NOTE — Interval H&P Note (Signed)
No change. OK to proceed.

## 2022-02-12 NOTE — Discharge Instructions (Addendum)
AMBULATORY SURGERY  DISCHARGE INSTRUCTIONS   Hemorrhoids, Care After This sheet gives you information about how to care for yourself after your procedure. Your health care provider may also give you more specific instructions. If you have problems or questions, contact your health care provider. What can I expect after the procedure? After the procedure, it is common to have: Soreness. Bruising. Itching.  Follow these instructions at home: site care Follow instructions from your health care provider about how to take care of your site. Make sure you: LEAVE packing in place until it falls out on its own.  No need to replace afterwards Leave stitches (sutures), skin glue, or adhesive strips in place.  If the area bleeds or bruises, apply gentle pressure for 10 minutes. OK TO SHOWER IN 24HRS  General instructions RESUME ELIQUIS THREE DAYS AFTER PROCEDURE Rest and then return to your normal activities as told by your health care provider. tylenol as needed for discomfort.    Use narcotics, if prescribed, only when tylenol is not enough to control pain.  325-623m every 8hrs to max of 30038m24hrs (including the 32585mn every norco dose) for the tylenol.    Keep all follow-up visits as told by your health care provider. This is important. Contact a health care provider if you have excessive: redness, swelling, or pain around your site. blood coming from your site. pus or a bad smell coming from your site. You have a fever.  Get help right away if: You have bleeding that does not stop with pressure or a dressing. Summary After the procedure, it is common to have some soreness, bruising, and itching at the site. Follow instructions from your health care provider about how to take care of your site. Keep all follow-up visits as told by your health care provider. This is important. This information is not intended to replace advice given to you by your health care provider. Make sure  you discuss any questions you have with your health care provider. Document Released: 08/01/2015 Document Revised: 01/02/2018 Document Reviewed: 01/02/2018 Elsevier Interactive Patient Education  [employer] The drugs that you were given will stay in your system until tomorrow so for the next 24 hours you should not:  Drive an automobile Make any legal decisions Drink any alcoholic beverage   You may resume regular meals tomorrow.  Today it is better to start with liquids and gradually work up to solid foods.  You may eat anything you prefer, but it is better to start with liquids, then soup and crackers, and gradually work up to solid foods.   Please notify your doctor immediately if you have any unusual bleeding, trouble breathing, redness and pain at the surgery site, drainage, fever, or pain not relieved by medication.    Additional Instructions:        Please contact your physician with any problems or Same Day Surgery at [phone] through Friday 6 am to 4 pm, or [place] [facility] at [phone]

## 2022-02-12 NOTE — Anesthesia Procedure Notes (Signed)
Procedure Name: LMA Insertion Date/Time: 02/12/2022 7:44 AM  Performed by: Garner Nash, CRNAPre-anesthesia Checklist: Patient identified, Emergency Drugs available, Suction available and Patient being monitored Patient Re-evaluated:Patient Re-evaluated prior to induction Oxygen Delivery Method: Circle system utilized Preoxygenation: Pre-oxygenation with 100% oxygen Induction Type: IV induction Ventilation: Mask ventilation without difficulty LMA: LMA inserted LMA Size: 5.0 Tube type: Oral Number of attempts: 1 Placement Confirmation: ETT inserted through vocal cords under direct vision, positive ETCO2 and breath sounds checked- equal and bilateral Tube secured with: Tape Dental Injury: Teeth and Oropharynx as per pre-operative assessment

## 2022-02-12 NOTE — ED Notes (Signed)
On arrival to ed pt with cyanotic lips and fingertips noted, pt with pox on 2lpm 50%. Pt placed on non rebreather at 15lpm with rebound pox up to 97% after five minutes.

## 2022-02-12 NOTE — Transfer of Care (Signed)
Immediate Anesthesia Transfer of Care Note  Patient: Jay Morris  Procedure(s) Performed: EXAM UNDER ANESTHESIA WITH HEMORRHOIDECTOMY  Patient Location: PACU  Anesthesia Type:General  Level of Consciousness: awake  Airway & Oxygen Therapy: Patient connected to nasal cannula oxygen  Post-op Assessment: Report given to RN  Post vital signs: stable  Last Vitals:  Vitals Value Taken Time  BP 130/54 02/12/22 0824  Temp    Pulse 71 02/12/22 0828  Resp 15 02/12/22 0828  SpO2 93 % 02/12/22 0828  Vitals shown include unvalidated device data.  Last Pain:  Vitals:   02/12/22 0635  TempSrc: Temporal  PainSc: 3       Patients Stated Pain Goal: 0 (98/10/25 4862)  Complications: No notable events documented.

## 2022-02-12 NOTE — Anesthesia Postprocedure Evaluation (Signed)
Anesthesia Post Note  Patient: Jay Morris  Procedure(s) Performed: EXAM UNDER ANESTHESIA WITH HEMORRHOIDECTOMY  Patient location during evaluation: PACU Anesthesia Type: General Level of consciousness: awake and alert Pain management: pain level controlled Vital Signs Assessment: post-procedure vital signs reviewed and stable Respiratory status: spontaneous breathing, nonlabored ventilation, respiratory function stable and patient connected to nasal cannula oxygen Cardiovascular status: blood pressure returned to baseline and stable Postop Assessment: no apparent nausea or vomiting Anesthetic complications: no   No notable events documented.   Last Vitals:  Vitals:   02/12/22 0848 02/12/22 0905  BP: (!) 120/57 (!) 107/55  Pulse: 73 75  Resp: 11 14  Temp: 36.6 C (!) 36.3 C  SpO2: 94% 93%    Last Pain:  Vitals:   02/12/22 0905  TempSrc: Temporal  PainSc: 0-No pain                 Dimas Millin

## 2022-02-13 ENCOUNTER — Encounter: Payer: Self-pay | Admitting: Family Medicine

## 2022-02-13 DIAGNOSIS — E1122 Type 2 diabetes mellitus with diabetic chronic kidney disease: Secondary | ICD-10-CM | POA: Diagnosis present

## 2022-02-13 DIAGNOSIS — N400 Enlarged prostate without lower urinary tract symptoms: Secondary | ICD-10-CM | POA: Diagnosis present

## 2022-02-13 DIAGNOSIS — E1121 Type 2 diabetes mellitus with diabetic nephropathy: Secondary | ICD-10-CM | POA: Diagnosis not present

## 2022-02-13 DIAGNOSIS — E785 Hyperlipidemia, unspecified: Secondary | ICD-10-CM | POA: Diagnosis present

## 2022-02-13 DIAGNOSIS — Z20822 Contact with and (suspected) exposure to covid-19: Secondary | ICD-10-CM | POA: Diagnosis present

## 2022-02-13 DIAGNOSIS — I480 Paroxysmal atrial fibrillation: Secondary | ICD-10-CM | POA: Diagnosis present

## 2022-02-13 DIAGNOSIS — Z794 Long term (current) use of insulin: Secondary | ICD-10-CM

## 2022-02-13 DIAGNOSIS — Y95 Nosocomial condition: Secondary | ICD-10-CM | POA: Diagnosis present

## 2022-02-13 DIAGNOSIS — E1165 Type 2 diabetes mellitus with hyperglycemia: Secondary | ICD-10-CM | POA: Diagnosis present

## 2022-02-13 DIAGNOSIS — J8410 Pulmonary fibrosis, unspecified: Secondary | ICD-10-CM | POA: Diagnosis present

## 2022-02-13 DIAGNOSIS — J189 Pneumonia, unspecified organism: Secondary | ICD-10-CM | POA: Diagnosis present

## 2022-02-13 DIAGNOSIS — R6520 Severe sepsis without septic shock: Secondary | ICD-10-CM | POA: Diagnosis present

## 2022-02-13 DIAGNOSIS — D631 Anemia in chronic kidney disease: Secondary | ICD-10-CM | POA: Diagnosis present

## 2022-02-13 DIAGNOSIS — J449 Chronic obstructive pulmonary disease, unspecified: Secondary | ICD-10-CM | POA: Diagnosis present

## 2022-02-13 DIAGNOSIS — I130 Hypertensive heart and chronic kidney disease with heart failure and stage 1 through stage 4 chronic kidney disease, or unspecified chronic kidney disease: Secondary | ICD-10-CM | POA: Diagnosis present

## 2022-02-13 DIAGNOSIS — I4892 Unspecified atrial flutter: Secondary | ICD-10-CM | POA: Diagnosis present

## 2022-02-13 DIAGNOSIS — R0602 Shortness of breath: Secondary | ICD-10-CM | POA: Diagnosis present

## 2022-02-13 DIAGNOSIS — K219 Gastro-esophageal reflux disease without esophagitis: Secondary | ICD-10-CM | POA: Diagnosis present

## 2022-02-13 DIAGNOSIS — J690 Pneumonitis due to inhalation of food and vomit: Secondary | ICD-10-CM | POA: Diagnosis present

## 2022-02-13 DIAGNOSIS — N184 Chronic kidney disease, stage 4 (severe): Secondary | ICD-10-CM | POA: Diagnosis present

## 2022-02-13 DIAGNOSIS — I5032 Chronic diastolic (congestive) heart failure: Secondary | ICD-10-CM | POA: Diagnosis present

## 2022-02-13 DIAGNOSIS — A419 Sepsis, unspecified organism: Secondary | ICD-10-CM | POA: Diagnosis present

## 2022-02-13 DIAGNOSIS — I10 Essential (primary) hypertension: Secondary | ICD-10-CM

## 2022-02-13 DIAGNOSIS — I272 Pulmonary hypertension, unspecified: Secondary | ICD-10-CM | POA: Diagnosis present

## 2022-02-13 DIAGNOSIS — I471 Supraventricular tachycardia: Secondary | ICD-10-CM | POA: Diagnosis present

## 2022-02-13 DIAGNOSIS — J9621 Acute and chronic respiratory failure with hypoxia: Secondary | ICD-10-CM | POA: Diagnosis present

## 2022-02-13 DIAGNOSIS — I700 Atherosclerosis of aorta: Secondary | ICD-10-CM | POA: Diagnosis present

## 2022-02-13 DIAGNOSIS — Z8616 Personal history of COVID-19: Secondary | ICD-10-CM | POA: Diagnosis not present

## 2022-02-13 DIAGNOSIS — I2510 Atherosclerotic heart disease of native coronary artery without angina pectoris: Secondary | ICD-10-CM | POA: Diagnosis present

## 2022-02-13 LAB — HEMOGLOBIN A1C
Hgb A1c MFr Bld: 7.2 % — ABNORMAL HIGH (ref 4.8–5.6)
Mean Plasma Glucose: 159.94 mg/dL

## 2022-02-13 LAB — CBC
HCT: 26.8 % — ABNORMAL LOW (ref 39.0–52.0)
Hemoglobin: 8.5 g/dL — ABNORMAL LOW (ref 13.0–17.0)
MCH: 31.7 pg (ref 26.0–34.0)
MCHC: 31.7 g/dL (ref 30.0–36.0)
MCV: 100 fL (ref 80.0–100.0)
Platelets: 218 10*3/uL (ref 150–400)
RBC: 2.68 MIL/uL — ABNORMAL LOW (ref 4.22–5.81)
RDW: 15.5 % (ref 11.5–15.5)
WBC: 27.4 10*3/uL — ABNORMAL HIGH (ref 4.0–10.5)
nRBC: 0 % (ref 0.0–0.2)

## 2022-02-13 LAB — BASIC METABOLIC PANEL
Anion gap: 6 (ref 5–15)
BUN: 26 mg/dL — ABNORMAL HIGH (ref 8–23)
CO2: 26 mmol/L (ref 22–32)
Calcium: 8.1 mg/dL — ABNORMAL LOW (ref 8.9–10.3)
Chloride: 106 mmol/L (ref 98–111)
Creatinine, Ser: 2.33 mg/dL — ABNORMAL HIGH (ref 0.61–1.24)
GFR, Estimated: 28 mL/min — ABNORMAL LOW (ref >60)
Glucose, Bld: 140 mg/dL — ABNORMAL HIGH (ref 70–99)
Potassium: 4.7 mmol/L (ref 3.5–5.1)
Sodium: 138 mmol/L (ref 135–145)

## 2022-02-13 LAB — PROTIME-INR
INR: 1.2 (ref 0.8–1.2)
Prothrombin Time: 14.9 seconds (ref 11.4–15.2)

## 2022-02-13 LAB — PROCALCITONIN: Procalcitonin: 5.5 ng/mL

## 2022-02-13 LAB — CORTISOL-AM, BLOOD: Cortisol - AM: 7.7 ug/dL (ref 6.7–22.6)

## 2022-02-13 LAB — LACTIC ACID, PLASMA: Lactic Acid, Venous: 2.6 mmol/L (ref 0.5–1.9)

## 2022-02-13 LAB — GLUCOSE, CAPILLARY
Glucose-Capillary: 197 mg/dL — ABNORMAL HIGH (ref 70–99)
Glucose-Capillary: 278 mg/dL — ABNORMAL HIGH (ref 70–99)

## 2022-02-13 LAB — CBG MONITORING, ED: Glucose-Capillary: 258 mg/dL — ABNORMAL HIGH (ref 70–99)

## 2022-02-13 MED ORDER — ASCORBIC ACID 500 MG PO TABS
500.0000 mg | ORAL_TABLET | Freq: Every day | ORAL | Status: DC
  Administered 2022-02-13 – 2022-02-19 (×7): 500 mg via ORAL
  Filled 2022-02-13 (×7): qty 1

## 2022-02-13 MED ORDER — GUAIFENESIN ER 600 MG PO TB12
600.0000 mg | ORAL_TABLET | Freq: Two times a day (BID) | ORAL | Status: DC
  Administered 2022-02-13 – 2022-02-19 (×13): 600 mg via ORAL
  Filled 2022-02-13 (×13): qty 1

## 2022-02-13 MED ORDER — APIXABAN 5 MG PO TABS
5.0000 mg | ORAL_TABLET | Freq: Two times a day (BID) | ORAL | Status: DC
  Administered 2022-02-13 – 2022-02-19 (×10): 5 mg via ORAL
  Filled 2022-02-13 (×13): qty 1

## 2022-02-13 MED ORDER — NYSTATIN-TRIAMCINOLONE 100000-0.1 UNIT/GM-% EX OINT: 1.0000 | TOPICAL_OINTMENT | Freq: Two times a day (BID) | CUTANEOUS | Status: DC

## 2022-02-13 MED ORDER — SILDENAFIL CITRATE 20 MG PO TABS
20.0000 mg | ORAL_TABLET | Freq: Three times a day (TID) | ORAL | Status: DC
  Administered 2022-02-13 – 2022-02-19 (×19): 20 mg via ORAL
  Filled 2022-02-13 (×21): qty 1

## 2022-02-13 MED ORDER — SODIUM CHLORIDE 0.9 % IV SOLN
3.0000 g | Freq: Two times a day (BID) | INTRAVENOUS | Status: AC
  Administered 2022-02-13 – 2022-02-18 (×12): 3 g via INTRAVENOUS
  Filled 2022-02-13 (×2): qty 3
  Filled 2022-02-13: qty 8
  Filled 2022-02-13: qty 3
  Filled 2022-02-13: qty 8
  Filled 2022-02-13 (×2): qty 3
  Filled 2022-02-13 (×2): qty 8
  Filled 2022-02-13 (×2): qty 3
  Filled 2022-02-13: qty 8

## 2022-02-13 MED ORDER — FLUTICASONE FUROATE-VILANTEROL 100-25 MCG/ACT IN AEPB
1.0000 | INHALATION_SPRAY | Freq: Every day | RESPIRATORY_TRACT | Status: DC
  Administered 2022-02-14 – 2022-02-19 (×6): 1 via RESPIRATORY_TRACT
  Filled 2022-02-13: qty 28

## 2022-02-13 MED ORDER — PANTOPRAZOLE SODIUM 40 MG PO TBEC
40.0000 mg | DELAYED_RELEASE_TABLET | Freq: Two times a day (BID) | ORAL | Status: DC
  Administered 2022-02-13 – 2022-02-19 (×13): 40 mg via ORAL
  Filled 2022-02-13 (×13): qty 1

## 2022-02-13 MED ORDER — ONDANSETRON HCL 4 MG PO TABS: 4.0000 mg | ORAL_TABLET | Freq: Four times a day (QID) | ORAL | Status: DC | PRN

## 2022-02-13 MED ORDER — ONDANSETRON HCL 4 MG/2ML IJ SOLN: 4.0000 mg | Freq: Four times a day (QID) | INTRAMUSCULAR | Status: DC | PRN

## 2022-02-13 MED ORDER — FLUTICASONE FUROATE-VILANTEROL 100-25 MCG/ACT IN AEPB
1.0000 | INHALATION_SPRAY | Freq: Every day | RESPIRATORY_TRACT | Status: DC
Start: 2022-02-13 — End: 2022-02-13
  Filled 2022-02-13: qty 28

## 2022-02-13 MED ORDER — VITAMIN D 25 MCG (1000 UNIT) PO TABS
5000.0000 [IU] | ORAL_TABLET | Freq: Every day | ORAL | Status: DC
  Administered 2022-02-13 – 2022-02-19 (×7): 5000 [IU] via ORAL
  Filled 2022-02-13 (×7): qty 5

## 2022-02-13 MED ORDER — LISINOPRIL 10 MG PO TABS
10.0000 mg | ORAL_TABLET | Freq: Every day | ORAL | Status: DC
  Administered 2022-02-13 – 2022-02-18 (×6): 10 mg via ORAL
  Filled 2022-02-13 (×6): qty 1

## 2022-02-13 MED ORDER — OXYCODONE HCL 5 MG PO TABS
5.0000 mg | ORAL_TABLET | Freq: Three times a day (TID) | ORAL | Status: DC | PRN
  Administered 2022-02-15 – 2022-02-19 (×5): 5 mg via ORAL
  Filled 2022-02-13 (×5): qty 1

## 2022-02-13 MED ORDER — VITAMIN B-12 1000 MCG PO TABS
1000.0000 ug | ORAL_TABLET | Freq: Every day | ORAL | Status: DC
  Administered 2022-02-13 – 2022-02-19 (×7): 1000 ug via ORAL
  Filled 2022-02-13 (×7): qty 1

## 2022-02-13 MED ORDER — TRAZODONE HCL 50 MG PO TABS
25.0000 mg | ORAL_TABLET | Freq: Every evening | ORAL | Status: DC | PRN
  Administered 2022-02-13 – 2022-02-17 (×2): 25 mg via ORAL
  Filled 2022-02-13 (×2): qty 1

## 2022-02-13 MED ORDER — SODIUM CHLORIDE 0.9 % IV SOLN: INTRAVENOUS | Status: DC

## 2022-02-13 MED ORDER — SODIUM CHLORIDE 0.9 % IV SOLN: 2.0000 g | INTRAVENOUS | Status: DC

## 2022-02-13 MED ORDER — IPRATROPIUM-ALBUTEROL 0.5-2.5 (3) MG/3ML IN SOLN
3.0000 mL | Freq: Four times a day (QID) | RESPIRATORY_TRACT | Status: DC
  Administered 2022-02-13 – 2022-02-14 (×4): 3 mL via RESPIRATORY_TRACT
  Filled 2022-02-13 (×4): qty 3

## 2022-02-13 MED ORDER — SODIUM CHLORIDE 0.9 % IV SOLN
500.0000 mg | INTRAVENOUS | Status: DC
  Administered 2022-02-13 – 2022-02-15 (×4): 500 mg via INTRAVENOUS
  Filled 2022-02-13 (×3): qty 5
  Filled 2022-02-13: qty 500
  Filled 2022-02-13: qty 5

## 2022-02-13 MED ORDER — MAGNESIUM HYDROXIDE 400 MG/5ML PO SUSP: 30.0000 mL | Freq: Every day | ORAL | Status: DC | PRN

## 2022-02-13 MED ORDER — FINASTERIDE 5 MG PO TABS
5.0000 mg | ORAL_TABLET | Freq: Every day | ORAL | Status: DC
  Administered 2022-02-13 – 2022-02-19 (×7): 5 mg via ORAL
  Filled 2022-02-13 (×7): qty 1

## 2022-02-13 MED ORDER — TREPROSTINIL 0.6 MG/ML IN SOLN
18.0000 ug | Freq: Four times a day (QID) | RESPIRATORY_TRACT | Status: DC
  Administered 2022-02-13 – 2022-02-19 (×24): 18 ug via RESPIRATORY_TRACT
  Filled 2022-02-13: qty 2.9

## 2022-02-13 MED ORDER — ATORVASTATIN CALCIUM 20 MG PO TABS
20.0000 mg | ORAL_TABLET | Freq: Every day | ORAL | Status: DC
  Administered 2022-02-13 – 2022-02-18 (×6): 20 mg via ORAL
  Filled 2022-02-13 (×6): qty 1

## 2022-02-13 MED ORDER — FUROSEMIDE 40 MG PO TABS
40.0000 mg | ORAL_TABLET | Freq: Every day | ORAL | Status: DC
  Administered 2022-02-13 – 2022-02-16 (×4): 40 mg via ORAL
  Filled 2022-02-13 (×4): qty 1

## 2022-02-13 MED ORDER — INSULIN ASPART 100 UNIT/ML IJ SOLN
0.0000 [IU] | Freq: Every day | INTRAMUSCULAR | Status: DC
  Administered 2022-02-15 – 2022-02-17 (×2): 2 [IU] via SUBCUTANEOUS
  Filled 2022-02-13: qty 1

## 2022-02-13 MED ORDER — ACETAMINOPHEN 325 MG PO TABS
650.0000 mg | ORAL_TABLET | Freq: Four times a day (QID) | ORAL | Status: DC | PRN
  Administered 2022-02-13 – 2022-02-17 (×2): 650 mg via ORAL
  Filled 2022-02-13 (×2): qty 2

## 2022-02-13 MED ORDER — INSULIN ASPART 100 UNIT/ML IJ SOLN
0.0000 [IU] | Freq: Three times a day (TID) | INTRAMUSCULAR | Status: DC
  Administered 2022-02-13: 2 [IU] via SUBCUTANEOUS
  Administered 2022-02-13 – 2022-02-14 (×3): 5 [IU] via SUBCUTANEOUS
  Administered 2022-02-14 – 2022-02-15 (×2): 7 [IU] via SUBCUTANEOUS
  Administered 2022-02-15: 3 [IU] via SUBCUTANEOUS
  Administered 2022-02-15: 7 [IU] via SUBCUTANEOUS
  Administered 2022-02-16: 2 [IU] via SUBCUTANEOUS
  Administered 2022-02-16: 3 [IU] via SUBCUTANEOUS
  Administered 2022-02-16: 2 [IU] via SUBCUTANEOUS
  Administered 2022-02-17: 7 [IU] via SUBCUTANEOUS
  Administered 2022-02-17 (×2): 3 [IU] via SUBCUTANEOUS
  Administered 2022-02-18 (×2): 2 [IU] via SUBCUTANEOUS
  Administered 2022-02-18: 7 [IU] via SUBCUTANEOUS
  Administered 2022-02-19: 2 [IU] via SUBCUTANEOUS
  Filled 2022-02-13 (×18): qty 1

## 2022-02-13 MED ORDER — ALBUTEROL SULFATE (2.5 MG/3ML) 0.083% IN NEBU
3.0000 mL | INHALATION_SOLUTION | Freq: Four times a day (QID) | RESPIRATORY_TRACT | Status: DC | PRN
  Administered 2022-02-13 – 2022-02-16 (×2): 3 mL via RESPIRATORY_TRACT
  Filled 2022-02-13 (×3): qty 3

## 2022-02-13 MED ORDER — MAGNESIUM OXIDE 400 MG PO TABS
400.0000 mg | ORAL_TABLET | Freq: Three times a day (TID) | ORAL | Status: DC
  Administered 2022-02-13 – 2022-02-19 (×19): 400 mg via ORAL
  Filled 2022-02-13 (×38): qty 1

## 2022-02-13 MED ORDER — ALLOPURINOL 100 MG PO TABS
200.0000 mg | ORAL_TABLET | Freq: Every day | ORAL | Status: DC
  Administered 2022-02-13 – 2022-02-19 (×7): 200 mg via ORAL
  Filled 2022-02-13 (×7): qty 2

## 2022-02-13 MED ORDER — ACETAMINOPHEN 650 MG RE SUPP: 650.0000 mg | Freq: Four times a day (QID) | RECTAL | Status: DC | PRN

## 2022-02-13 MED ORDER — METHYLPREDNISOLONE SODIUM SUCC 40 MG IJ SOLR
40.0000 mg | Freq: Two times a day (BID) | INTRAMUSCULAR | Status: DC
  Administered 2022-02-13 – 2022-02-19 (×12): 40 mg via INTRAVENOUS
  Filled 2022-02-13 (×12): qty 1

## 2022-02-13 MED ORDER — HYDROCOD POLI-CHLORPHE POLI ER 10-8 MG/5ML PO SUER
5.0000 mL | Freq: Two times a day (BID) | ORAL | Status: DC | PRN
  Administered 2022-02-17 – 2022-02-18 (×2): 5 mL via ORAL
  Filled 2022-02-13 (×2): qty 5

## 2022-02-13 NOTE — Assessment & Plan Note (Addendum)
-  We will continue statin therapy and fish oil.

## 2022-02-13 NOTE — ED Notes (Signed)
Pt stood to transfer from ED stretcher to bed- sats dropped to low 80s. Once he sat back down they did come back up to 92%. Pt is currently resting and in no distress.

## 2022-02-13 NOTE — ED Notes (Signed)
Pt's oxygen level noted to drop into mid-80s on 5L [place] while eating breakfast.  Pt reports feeling "a little" short of breath.

## 2022-02-13 NOTE — H&P (Addendum)
[place]   PATIENT NAME: Jay Morris    MR#:  [id number]  DATE OF BIRTH:  January 21, 1946  DATE OF ADMISSION:  02/12/2022  PRIMARY CARE PHYSICIAN: [name], MD   Patient is coming from: Home  REQUESTING/REFERRING PHYSICIAN: Nance Pear, MD  CHIEF COMPLAINT:   Chief Complaint  Patient presents with   Fever    Patient C/O fever, nausea that began today. He self-cath's and was positive for yeast in the urine two weeks ago    HISTORY OF PRESENT ILLNESS:  Jay Morris is a 76 y.o. Caucasian male with medical history significant for BPH, stage III CKD, COPD, coronary artery disease, DM 2, GERD, hypertension and dyslipidemia, who underwent hemorrhoidectomy earlier yesterday.  During the evening he experienced dry cough with associated dyspnea and wheezing.  He was noted to have fever and chills.  He admitted to nausea with dry heaves without abdominal pain.  No dysuria, oliguria or hematuria or flank pain.  No chest pain or palpitations.  ED Course: When he came to the ER, temperature was normal later 103.2.  BP was 152/69 with pulse symmetry of 92% on 3 L of O2 by nasal cannula otherwise normal vital signs.  CMP was remarkable for a BUN of 26 and creatinine 2.19 close to previous levels with calcium of 8.6.  Lactic acid was 2.7 and later 2.6 CBC showed leukocytosis of 19.4 with neutrophilia and anemia close to baseline.  Coagulation profile was within normal. EKG as reviewed by me : Showed likely sinus tachycardia with a rate of 124 without acute changes. Imaging: Portable chest x-ray showed extensive chronic lung disease and fibrosis with increased bilateral patchy airspace opacities suspicious for superimposed acute infectious or inflammatory process.  The patient was given IV vancomycin and cefepime as well as 1 L bolus of IV lactated R[place].  He will be admitted to a PCU bed for further evaluation and management. PAST MEDICAL HISTORY:   Past Medical History:   Diagnosis Date   (HFpEF) heart failure with preserved ejection fraction ([place])    a.) TTE 04/01/2016: EF 60-65%, LAE, MAC, mild MR/AR/TR, mild-mod PR, G2DD; b.) TTE 12/18/2018: EF 55%, mild LVH, mild AR/MR/TR. mod PR; c.) TTE 05/31/2019: EF 60-65%, mild LVH, RAE/RVE, triv AR.MT, PR, mild TR; d.) TTE 10/26/2019: EF 55%, BAE, RVE, triv AR, mild MR/PR, sev TR, G2DD; e.) R/LHC 01/17/2020: mPA 52, PCWP 25, LVEDP 19, RVEDP 25, mRA 13   Anemia    Aortic atherosclerosis (HCC)    Asthma    BPH (benign prostatic hyperplasia)    CKD (chronic kidney disease), stage III (HCC)    COPD (chronic obstructive pulmonary disease) ([place])    Coronary artery disease 01/17/2020   a.) LHC 01/17/2020: 25% oLAD, 65% o-pRCA, 45% pLAD, 65% mLAD, 35% OM2 --> med mgmt.   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   PAF (paroxysmal atrial fibrillation) (HCC)    a.) CHA2DS2-VASc = 6 (age x 2, HFpEF, HTN, vascular disease, T2DM);  b.) cardiac ablation in approx 2014-2015 at [facility]; c.) A.fib recurrence  in setting of SARS-CoV-2 --> Tx'd with IV amiodarone + metoprolol + DCCV x 1 (200 J) 06/22/2019 restoring NSR; d.) rate/rhythm maintained without pharmacological intervention; chronically anticoagulated using standard dose apixaban   Pneumonia due to COVID-19 virus 06/02/2019   a.) hospitalized at [facility]) from 06/02/2019 - 06/25/2019; Tx'd with steriods + remdesivir + convalescent plasma   Pulmonary fibrosis (HCC)    Pulmonary HTN ([place]) 04/01/2016   a.) TTE 04/01/2016: EF 60-65%, RVSP 40; b.) TTE 12/18/2018: EF 55%, RVSP 45.9; c.) TTE  05/31/2019: EF 60-65%, RVSP 35.5; d.) TTE 10/26/2019: EF 55%, RVSP 100.4; e.) R/LHC 01/17/2020: mPA 52, PCWP 25, LVEDP 19, RVEDP 25, mRA 13; f.) Tx with PDE5i (sildenafil) + vasodilator (treprostinil)   Type 2 diabetes mellitus treated with insulin ([place])     PAST SURGICAL HISTORY:   Past Surgical History:  Procedure Laterality Date   BACK SURGERY     x3 L3-L4    CARDIAC ELECTROPHYSIOLOGY MAPPING AND ABLATION N/A    performed at [place] in approximately 2014-2015   CARDIOVERSION N/A 06/22/2019   Procedure: CARDIOVERSION;  Surgeon: Fay Records, MD;  Location: [facility];  Service: Cardiovascular;  Laterality: N/A;   COLONOSCOPY WITH PROPOFOL N/A 06/15/2017   Procedure: COLONOSCOPY WITH PROPOFOL;  Surgeon: Toledo, Benay Pike, MD;  Location: [facility] ENDOSCOPY;  Service: Gastroenterology;  Laterality: N/A;   ENTEROSCOPY N/A 11/24/2017   Procedure: ENTEROSCOPY;  Surgeon: Jonathon Bellows, MD;  Location: [facility] ENDOSCOPY;  Service: Gastroenterology;  Laterality: N/A;   ESOPHAGOGASTRODUODENOSCOPY N/A 11/27/2017   Procedure: ESOPHAGOGASTRODUODENOSCOPY (EGD);  Surgeon: Lucilla Lame, MD;  Location: [facility] ENDOSCOPY;  Service: Endoscopy;  Laterality: N/A;   ESOPHAGOGASTRODUODENOSCOPY (EGD) WITH PROPOFOL N/A 06/02/2017   Procedure: ESOPHAGOGASTRODUODENOSCOPY (EGD) WITH PROPOFOL;  Surgeon: Toledo, Benay Pike, MD;  Location: [facility] ENDOSCOPY;  Service: Gastroenterology;  Laterality: N/A;   ESOPHAGOGASTRODUODENOSCOPY (EGD) WITH PROPOFOL N/A 08/04/2021   Procedure: ESOPHAGOGASTRODUODENOSCOPY (EGD) WITH PROPOFOL;  Surgeon: Lesly Rubenstein, MD;  Location: [facility] ENDOSCOPY;  Service: Endoscopy;  Laterality: N/A;   EVALUATION UNDER ANESTHESIA WITH HEMORRHOIDECTOMY N/A 02/12/2022   Procedure: EXAM UNDER ANESTHESIA WITH HEMORRHOIDECTOMY;  Surgeon: Benjamine Sprague, DO;  Location: [facility] ORS;  Service: General;  Laterality: N/A;   GIVENS CAPSULE STUDY N/A 11/25/2017   Procedure: GIVENS CAPSULE STUDY;  Surgeon: Jonathon Bellows, MD;   Location: [facility] ENDOSCOPY;  Service: Gastroenterology;  Laterality: N/A;   HAND SURGERY     RIGHT/LEFT HEART CATH AND CORONARY ANGIOGRAPHY N/A 01/17/2020   Procedure: RIGHT/LEFT HEART CATH AND CORONARY ANGIOGRAPHY;  Surgeon: Corey Skains, MD;  Location: [place] CV LAB;  Service: Cardiovascular;  Laterality: N/A;    SOCIAL HISTORY:   Social History   Tobacco Use   Smoking status: Former    Packs/day: 1.00    Years: 30.00    Total pack years: 30.00    Types: Cigarettes    Quit date: 1995    Years since quitting: 28.5   Smokeless tobacco: Never  Substance Use Topics   Alcohol use: No    Alcohol/week: 0.0 standard drinks of alcohol    Comment: occasional    FAMILY HISTORY:   Family History  Problem Relation Age of Onset   Heart disease Mother    Cancer Father    Cancer Paternal Grandfather     DRUG ALLERGIES:   Allergies  Allergen Reactions   Shrimp [Shellfish Allergy] Anaphylaxis   Shrimp Extract Allergy Skin Test     REVIEW OF SYSTEMS:   ROS As per history of present  illness. All pertinent systems were reviewed above. Constitutional, HEENT, cardiovascular, respiratory, GI, GU, musculoskeletal, neuro, psychiatric, endocrine, integumentary and hematologic systems were reviewed and are otherwise negative/unremarkable except for positive findings mentioned above in the HPI.   MEDICATIONS AT HOME:   Prior to Admission medications   Medication Sig Start Date End Date Taking? Authorizing Provider  acetaminophen (TYLENOL) 500 MG tablet Take 500 mg by mouth every 6 (six) hours as needed for moderate pain or headache. 08/07/21  Yes Val Riles, MD  albuterol (VENTOLIN HFA) 108 (90 Base) MCG/ACT inhaler Inhale 2 puffs into the lungs daily.   Yes [provider]  allopurinol (ZYLOPRIM) 100 MG tablet Take 2 tablets by mouth daily. 06/25/21  Yes [provider]  ascorbic acid (VITAMIN C) 500 MG tablet Take 500 mg by mouth daily.   Yes [provider]  atorvastatin (LIPITOR) 20 MG tablet Take 20 mg by mouth at bedtime.    Yes [provider]  BREO ELLIPTA 100-25 MCG/INH AEPB Inhale 1 puff into the lungs daily. 12/04/15  Yes [provider]  Cholecalciferol (DIALYVITE VITAMIN D 5000) 125 MCG (5000 UT) capsule Take 5,000 Units by mouth daily.   Yes [provider]  ELIQUIS 5 MG TABS tablet SMARTSIG:1 Tablet(s) By Mouth Every 12 Hours 01/21/20  Yes [provider]  finasteride (PROSCAR) 5 MG tablet TAKE 1 TABLET BY MOUTH DAILY 08/10/21  Yes McGowan, Larene Beach A, PA-C  furosemide (LASIX) 80 MG tablet Take 40 mg by mouth daily.   Yes [provider]  insulin aspart (NOVOLOG) 100 UNIT/ML FlexPen Inject 20-26 Units into the skin 3 (three) times daily with meals. 20 units am 26 units noon, 26 units evening 08/27/19  Yes [provider]  insulin degludec (TRESIBA FLEXTOUCH) 200 UNIT/ML FlexTouch Pen Inject 60 Units into the skin in the morning and at bedtime.   Yes [provider]  lisinopril (ZESTRIL) 20 MG tablet Take 10 mg by mouth at bedtime.    Yes [provider]  magnesium oxide (MAG-OX) 400 MG tablet Take 1 tablet by mouth 3 (three) times daily. 07/23/20  Yes [provider]  nystatin-triamcinolone ointment (MYCOLOG) Apply 1 Application topically 2 (two) times daily. Do not use for longer than 4-6 weeks. 02/01/22  Yes Vaillancourt, Samantha, PA-C  oxyCODONE (ROXICODONE) 5 MG immediate release tablet Take 1 tablet (5 mg total) by mouth every 8 (eight) hours as needed. 02/12/22 02/12/23 Yes Sakai, Isami, DO  pantoprazole (PROTONIX) 40 MG tablet Take 1 tablet (40 mg total) by mouth 2 (two) times daily before a meal. 06/25/19  Yes Alekh, Kshitiz, MD  sildenafil (REVATIO) 20 MG tablet Take 20 mg by mouth 3 (three) times daily.   Yes [provider]  TYVASO REFILL 0.6 MG/ML SOLN Inhale 18 mcg into the lungs in the morning, at noon, in the evening, and at bedtime.  08/07/21  Yes Val Riles, MD  vitamin B-12 (CYANOCOBALAMIN) 1000 MCG tablet Take 1,000 mcg by mouth daily.   Yes [provider]  lidocaine (XYLOCAINE) 5 % ointment Apply 1 Application topically 3 (three) times daily as needed. Apply to affected area as needed 3 times daily 02/12/22   Lysle Pearl, Isami, DO      VITAL SIGNS:  Blood pressure (!) 106/55, pulse 85, temperature 97.8 F (36.6 C), temperature source Axillary, resp. rate 14, weight 82.6 kg, SpO2 96 %.  PHYSICAL EXAMINATION:  Physical Exam  GENERAL:  76 y.o.-year-old Caucasian male patient lying in the bed with  no acute distress.  EYES: Pupils equal, round, reactive to light and accommodation. No scleral icterus. Extraocular muscles intact.  HEENT: Head atraumatic, normocephalic. Oropharynx and nasopharynx clear.  NECK:  Supple, no jugular venous distention. No thyroid enlargement, no tenderness.  LUNGS: Diminished bibasilar breath sounds with right basal crackles more.  No use of accessory muscles of respiration.  CARDIOVASCULAR: Regular rate and rhythm, S1, S2 normal. No murmurs, rubs, or gallops.  ABDOMEN: Soft, nondistended, nontender. Bowel sounds present. No organomegaly or mass.  EXTREMITIES: No pedal edema, cyanosis, or clubbing.  NEUROLOGIC: Cranial nerves II through XII are intact. Muscle strength 5/5 in all extremities. Sensation intact. Gait not checked.  PSYCHIATRIC: The patient is alert and oriented x 3.  Normal affect and good eye contact. SKIN: No obvious rash, lesion, or ulcer.   LABORATORY PANEL:   CBC Recent Labs  Lab 02/12/22 2209  WBC 19.4*  HGB 9.6*  HCT 30.7*  PLT 229   ------------------------------------------------------------------------------------------------------------------  Chemistries  Recent Labs  Lab 02/12/22 2209  NA 140  K 3.9  CL 105  CO2 25  GLUCOSE 103*  BUN 26*  CREATININE 2.19*  CALCIUM 8.6*  AST 23  ALT 21  ALKPHOS 34*  BILITOT 1.1    ------------------------------------------------------------------------------------------------------------------  Cardiac Enzymes No results for input(s): "TROPONINI" in the last 168 hours. ------------------------------------------------------------------------------------------------------------------  RADIOLOGY:  DG Chest Port 1 View  Result Date: 02/12/2022 CLINICAL DATA:  Possible sepsis EXAM: PORTABLE CHEST 1 VIEW COMPARISON:  CT 10/03/2019, chest x-ray 08/03/2021, 06/23/2019, 01/04/2016 FINDINGS: Diffuse bilateral coarse reticular interstitial opacity consistent with fibrosis and chronic lung disease. Increased bilateral patchy opacities compared to prior suggesting acute superimposed infectious or inflammatory process. Stable cardiomediastinal silhouette with aortic atherosclerosis. No pneumothorax IMPRESSION: Extensive chronic lung disease and fibrosis. Increased bilateral patchy airspace opacities suspicious for superimposed acute infectious or inflammatory process Electronically Signed   By: Donavan Foil M.D.   On: 02/12/2022 22:46      IMPRESSION AND PLAN:  Assessment and Plan: * Sepsis due to pneumonia [facility]) - The patient will be admitted to a PCU bed. - This is highly suspicious about aspiration pneumonia perioperatively. - Sepsis manifested by leukocytosis as well as fever, tachycardia and tachypnea. - We will continue antibiotic therapy with IV Unasyn and Zithromax. - Mucolytic therapy and bronchodilator therapy will be provided. - We will follow blood cultures.  GERD without esophagitis - We will continue PPI therapy.  Chronic obstructive pulmonary disease (COPD) (HCC) - We will continue his inhalers but hold off long-acting beta agonist.  BPH (benign prostatic hyperplasia) - We will continue Flomax  HTN (hypertension) - We will continue his antihypertensives.  Type II diabetes mellitus with renal manifestations ([place]) - He will be placed on supplement  coverage with NovoLog. - We will continue his basal coverage and hold metformin.  Pulmonary hypertension, moderate to severe (HCC) - We will continue Revatio and Tyvaso.  Hyperlipidemia - We will continue statin therapy and fish oil.  Paroxysmal atrial fibrillation with RVR (HCC) - We will continue Eliquis and Lopressor.     DVT prophylaxis: Eliquis. Advanced Care Planning:  Code Status: full code.  Family Communication:  The plan of care was discussed in details with the patient (and family). I answered all questions. The patient agreed to proceed with the above mentioned plan. Further management will depend upon hospital course. Disposition Plan: Back to previous home environment Consults called: none.  All the records are reviewed and case discussed with ED provider.  Status is:  Inpatient    At the time of the admission, it appears that the appropriate admission status for this patient is inpatient.  This is judged to be reasonable and necessary in order to provide the required intensity of service to ensure the patient's safety given the presenting symptoms, physical exam findings and initial radiographic and laboratory data in the context of comorbid conditions.  The patient requires inpatient status due to high intensity of service, high risk of further deterioration and high frequency of surveillance required.  I certify that at the time of admission, it is my clinical judgment that the patient will require inpatient hospital care extending more than 2 midnights.                            Dispo: The patient is from: Home              Anticipated d/c is to: Home              Patient currently is not medically stable to d/c.              Difficult to place patient: No  Christel Mormon M.D on 02/13/2022 at 5:48 AM  [facility]   From 7 PM-7 AM, contact night-coverage [email or web address]  CC: Primary care physician; [name], MD

## 2022-02-13 NOTE — Assessment & Plan Note (Signed)
-  We will continue Revatio and Tyvaso.

## 2022-02-13 NOTE — Assessment & Plan Note (Addendum)
-  He will be placed on supplement coverage with NovoLog. - We will continue his basal coverage and hold metformin.

## 2022-02-13 NOTE — Assessment & Plan Note (Addendum)
-  We will continue Eliquis and Lopressor.

## 2022-02-13 NOTE — ED Notes (Signed)
Pt repositioned to eat breakfast.

## 2022-02-13 NOTE — ED Notes (Signed)
Pt's oxygen dropped into the 70s w/ exertion (rolling on side to pull up shorts).

## 2022-02-13 NOTE — Assessment & Plan Note (Signed)
-  We will continue Flomax

## 2022-02-13 NOTE — Assessment & Plan Note (Signed)
-  We will continue his inhalers but hold off long-acting beta agonist.

## 2022-02-13 NOTE — Assessment & Plan Note (Signed)
-  We will continue PPI therapy.

## 2022-02-13 NOTE — Assessment & Plan Note (Addendum)
-  The patient will be admitted to a PCU bed. - This is highly suspicious about aspiration pneumonia perioperatively. - Sepsis manifested by leukocytosis as well as fever, tachycardia and tachypnea. - We will continue antibiotic therapy with IV Unasyn and Zithromax. - Mucolytic therapy and bronchodilator therapy will be provided. - We will follow blood cultures.

## 2022-02-13 NOTE — Progress Notes (Signed)
[place] [facility] [place] NAME: Jay Morris    MR#:  [id number]  DATE OF BIRTH:  March 09, 1946  SUBJECTIVE:   came in with increasing shortness of breath cough fever 103 after he went home from hemorrhoidectomy surgery that was scheduled earlier in the day. Patient feels short winded and becomes very hypoxic on exertion. No family at bedside. Denies any chest pain. Uses chronic oxygen at home   VITALS:  Blood pressure (!) 126/53, pulse (!) 102, temperature 97.7 F (36.5 C), resp. rate 18, weight 82.6 kg, SpO2 91 %.  PHYSICAL EXAMINATION:   GENERAL:  76 y.o.-year-old patient lying in the bed with no acute distress.  LUNGS: distant breath sounds bilaterally, no wheezing, rales, rhonchi.  CARDIOVASCULAR: S1, S2 normal. No murmurs, rubs, or gallops. Tachycardia ABDOMEN: Soft, nontender, nondistended. Bowel sounds present.  EXTREMITIES: No  edema b/l.    NEUROLOGIC: nonfocal  patient is alert and awake SKIN: No obvious rash, lesion, or ulcer.   LABORATORY PANEL:  CBC Recent Labs  Lab 02/13/22 0639  WBC 27.4*  HGB 8.5*  HCT 26.8*  PLT 218    Chemistries  Recent Labs  Lab 02/12/22 2209 02/13/22 0639  NA 140 138  K 3.9 4.7  CL 105 106  CO2 25 26  GLUCOSE 103* 140*  BUN 26* 26*  CREATININE 2.19* 2.33*  CALCIUM 8.6* 8.1*  AST 23  --   ALT 21  --   ALKPHOS 34*  --   BILITOT 1.1  --    Cardiac Enzymes No results for input(s): "TROPONINI" in the last 168 hours. RADIOLOGY:  DG Chest Port 1 View  Result Date: 02/12/2022 CLINICAL DATA:  Possible sepsis EXAM: PORTABLE CHEST 1 VIEW COMPARISON:  CT 10/03/2019, chest x-ray 08/03/2021, 06/23/2019, 01/04/2016 FINDINGS: Diffuse bilateral coarse reticular interstitial opacity consistent with fibrosis and chronic lung disease. Increased bilateral patchy opacities compared to prior suggesting acute superimposed infectious or inflammatory process. Stable cardiomediastinal silhouette with aortic  atherosclerosis. No pneumothorax IMPRESSION: Extensive chronic lung disease and fibrosis. Increased bilateral patchy airspace opacities suspicious for superimposed acute infectious or inflammatory process Electronically Signed   By: Donavan Foil M.D.   On: 02/12/2022 22:46    Assessment and Plan Jay Morris is a 76 y.o. Caucasian male with medical history significant for BPH, stage IV CKD, COPD, coronary artery disease, DM 2, GERD, hypertension and dyslipidemia, who underwent hemorrhoidectomy earlier yesterday.  During the evening he experienced dry cough with associated dyspnea and wheezing.  He was noted to have fever and chills.  He admitted to nausea with dry heaves without abdominal pain   Portable chest x-ray showed extensive chronic lung disease and fibrosis with increased bilateral patchy airspace opacities suspicious for superimposed acute infectious or inflammatory process.  Severe sepsis secondary to pneumonia suspect aspiration with recent surgery performed 02/11/22 -- patient came in with increasing shortness of breath, tachycardia, fever 103.2,, tachypnea, elevated white count of 27, lactic acid 2.7-->2.6 -- chest x-ray shows bilateral patches airspace opacity superimposed infection -- patient received broad-spectrum antibiotic per sepsis protocol -- continue IV unasyn and azithromycin -- follow-up blood culture sputum culture  acute on chronic hypoxic respiratory failure secondary to chronic interstitial lung disease with chronic pulmonary hypertension and chronic home oxygen == patient follows with Dr. Raul Del -- continue IV Solu-Medrol, inhalers, bronchodilator nebs -- consider pulmonary consultation if needed -- continue home meds and nebulizer for pulmonary hypertension  Pulmonary hypertension, moderate to severe ([place]) -  continue Revatio and Tyvaso.   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 -- Will resume home dose insulin once PO intake  resumed  Hypertension -- continue Lisinopril and Lasix  CKD stage IV --Baseline creatinine around 2.3. Stable  paroxysmal atrial fibrillation -- continue eliquis  Procedures: Family communication :none today Consults :none CODE STATUS: FULL--confirmed with pt DVT Prophylaxis :eliquis Level of care: Progressive Status is: Inpatient Remains inpatient appropriate because: admitted with severe sepsis due to aspiration pneumonia and acute on chronic hypoxic respiratory failure. Patient still requiring high amount of oxygen.     TOTAL TIME TAKING CARE OF THIS PATIENT: 35 minutes.  >50% time spent on counselling and coordination of care  Note: This dictation was prepared with Dragon dictation along with smaller phrase technology. Any transcriptional errors that result from this process are unintentional.  Fritzi Mandes M.D    [facility]   CC: Primary care physician; [name], MD

## 2022-02-13 NOTE — Assessment & Plan Note (Signed)
-  We will continue his antihypertensives. 

## 2022-02-14 DIAGNOSIS — J189 Pneumonia, unspecified organism: Secondary | ICD-10-CM | POA: Diagnosis not present

## 2022-02-14 DIAGNOSIS — A419 Sepsis, unspecified organism: Secondary | ICD-10-CM | POA: Diagnosis not present

## 2022-02-14 LAB — GLUCOSE, CAPILLARY
Glucose-Capillary: 261 mg/dL — ABNORMAL HIGH (ref 70–99)
Glucose-Capillary: 306 mg/dL — ABNORMAL HIGH (ref 70–99)
Glucose-Capillary: 301 mg/dL — ABNORMAL HIGH (ref 70–99)
Glucose-Capillary: 290 mg/dL — ABNORMAL HIGH (ref 70–99)
Glucose-Capillary: 181 mg/dL — ABNORMAL HIGH (ref 70–99)

## 2022-02-14 LAB — URINE CULTURE: Culture: NO GROWTH

## 2022-02-14 MED ORDER — INSULIN ASPART 100 UNIT/ML IJ SOLN
20.0000 [IU] | Freq: Every day | INTRAMUSCULAR | Status: DC
  Administered 2022-02-15: 20 [IU] via SUBCUTANEOUS
  Filled 2022-02-14: qty 1

## 2022-02-14 MED ORDER — INSULIN ASPART 100 UNIT/ML IJ SOLN
26.0000 [IU] | Freq: Every day | INTRAMUSCULAR | Status: DC
  Administered 2022-02-14: 26 [IU] via SUBCUTANEOUS
  Filled 2022-02-14: qty 1

## 2022-02-14 MED ORDER — INSULIN ASPART 100 UNIT/ML FLEXPEN
15.0000 [IU] | PEN_INJECTOR | Freq: Three times a day (TID) | SUBCUTANEOUS | Status: DC
Start: 2022-02-14 — End: 2022-02-14

## 2022-02-14 MED ORDER — INSULIN GLARGINE-YFGN 100 UNIT/ML ~~LOC~~ SOLN
60.0000 [IU] | Freq: Every day | SUBCUTANEOUS | Status: DC
Start: 2022-02-14 — End: 2022-02-15
  Administered 2022-02-14 – 2022-02-15 (×2): 60 [IU] via SUBCUTANEOUS
  Filled 2022-02-14 (×2): qty 0.6

## 2022-02-14 MED ORDER — INSULIN ASPART 100 UNIT/ML IJ SOLN
26.0000 [IU] | Freq: Every day | INTRAMUSCULAR | Status: DC
  Administered 2022-02-14 – 2022-02-15 (×2): 26 [IU] via SUBCUTANEOUS
  Filled 2022-02-14 (×2): qty 1

## 2022-02-14 MED ORDER — IPRATROPIUM-ALBUTEROL 0.5-2.5 (3) MG/3ML IN SOLN
3.0000 mL | Freq: Three times a day (TID) | RESPIRATORY_TRACT | Status: DC
  Administered 2022-02-14 – 2022-02-17 (×7): 3 mL via RESPIRATORY_TRACT
  Filled 2022-02-14 (×8): qty 3

## 2022-02-14 NOTE — TOC Initial Note (Signed)
Transition of Care [facility]) - Initial/Assessment Note    Patient Details  Name: Jay Morris MRN: [id number] Date of Birth: Sep 09, 1945  Transition [facility]) CM/SW Contact:    Kerin Salen, RN Phone Number: 02/14/2022, 2:29 PM  Clinical Narrative:  CM spoke with patient, wife at bedside. Both live locally in a [facility], wife assist with ADL's, cooking, shopping, pick up Meds from [place] and transports to medical visits. Patient uses rolling walker and O2-3l [place] continuously. Oxygen provider [employer]. Wife says patient had [facility] in 2020 can not remember agency. Wife says grand-daughter also help as needed.   TOC will continue to track for discharge needs.              Expected Discharge Plan: Home/Self Care Barriers to Discharge: Continued Medical Work up   Patient Goals and CMS Choice Patient states their goals for this hospitalization and ongoing recovery are:: Return   Choice offered to / list presented to : NA  Expected Discharge Plan and Services Expected Discharge Plan: Home/Self Care       Living arrangements for the past 2 months: Single Family Home                   DME Agency: [employer]                  Prior Living Arrangements/Services Living arrangements for the past 2 months: Single Family Home Lives with:: Spouse Patient language and need for interpreter reviewed:: Yes        Need for Family Participation in Patient Care: Yes (Comment) (Spouse)   Current home services: DME ([facility]) Criminal Activity/Legal Involvement Pertinent to Current Situation/Hospitalization: No - Comment as needed  Activities of Daily Living Home Assistive Devices/Equipment: CBG Meter, Walker (specify type), Wheelchair ADL Screening (condition at time of admission) Patient's cognitive ability adequate to safely complete daily activities?: Yes Is the patient deaf or have difficulty hearing?: No Does the patient have difficulty seeing, even when wearing  glasses/contacts?: No Does the patient have difficulty concentrating, remembering, or making decisions?: No Patient able to express need for assistance with ADLs?: No Does the patient have difficulty dressing or bathing?: No Independently performs ADLs?: Yes (appropriate for developmental age) Does the patient have difficulty walking or climbing stairs?: Yes Weakness of Legs: None Weakness of Arms/Hands: None  Permission Sought/Granted                  Emotional Assessment Appearance:: Appears stated age Attitude/Demeanor/Rapport: Engaged Affect (typically observed): Accepting Orientation: : Oriented to Self, Oriented to Place, Oriented to  Time, Oriented to Situation Alcohol / Substance Use: Not Applicable Psych Involvement: No (comment)  Admission diagnosis:  Sepsis due to pneumonia ([place]) [J18.9, A41.9] Pneumonia due to infectious organism, unspecified laterality, unspecified part of lung [J18.9] Patient Active Problem List   Diagnosis Date Noted   Sepsis due to pneumonia ([place]) 02/13/2022   BPH (benign prostatic hyperplasia) 02/13/2022   Chronic obstructive pulmonary disease (COPD) ([place]) 02/13/2022   GERD without esophagitis 02/13/2022   Anemia in stage 4 chronic kidney disease ([place]) 12/22/2021   GI bleeding 08/03/2021   Chronic diastolic CHF (congestive heart failure) ([place]) 08/03/2021   HTN (hypertension) 08/03/2021   Atrial fibrillation, chronic ([place]) 08/03/2021   CKD (chronic kidney disease), stage IV ([place]) 08/03/2021   CAD (coronary artery disease) 08/03/2021   Leukocytosis 08/03/2021   Gout 01/08/2021   Hearing loss 01/08/2021   History of severe acute respiratory syndrome coronavirus 2 (  SARS-CoV-2) disease 01/08/2021   Vitamin D deficiency 01/08/2021   Benign prostatic hyperplasia 12/31/2020   Anemia in chronic kidney disease 12/31/2020   Heart failure, unspecified ([place]) 12/31/2020   CKD (chronic kidney disease) stage 3, GFR 30-59 ml/min (HCC) 01/28/2020    Acute on chronic systolic heart failure ([place]) 01/16/2020   Pulmonary hypertension, moderate to severe ([place]) 11/26/2019   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/17/2019   Anemia, unspecified 09/01/2019   Acute on chronic diastolic CHF (congestive heart failure), NYHA class 3 ([place]) 08/16/2019   PVD (peripheral vascular disease) ([place]) 06/03/2019   Hepatic steatosis 06/03/2019   Acute renal failure superimposed on stage 3a chronic kidney disease ([place]) 06/01/2019   COPD with acute exacerbation ([place]) 06/01/2019   Muscle weakness (generalized) 06/01/2019   COVID-19 virus infection 06/01/2019   AKI (acute kidney injury) ([place]) 05/31/2019   Acute respiratory failure with hypoxia ([place])    Vitamin B12 deficiency 05/08/2018   Type II diabetes mellitus with renal manifestations ([place]) 02/08/2018   At risk for bleeding 12/15/2017   Hypotension 12/01/2017   Iron deficiency anemia due to chronic blood loss    GI bleed 11/21/2017   Coronary artery disease involving native coronary artery of native heart 11/16/2017   Uncontrolled type 2 diabetes mellitus with hyperglycemia, with long-term current use of insulin ([place]) 11/24/2016   Chronic gouty arthritis 04/16/2016   COPD, moderate ([place]) 02/09/2016   Pneumonia 01/04/2016   Degeneration of lumbar intervertebral disc 10/28/2015   Paroxysmal atrial fibrillation with RVR ([place]) 05/21/2014   Hyperlipidemia 05/21/2014   PCP:  [name], MD Pharmacy:   [place], [place] [address] Phone: [phone] Fax: [phone]     Social Determinants of Health (SDOH) Interventions    Readmission Risk Interventions    08/07/2021    3:03 PM  Readmission Risk Prevention Plan  Transportation Screening Complete  PCP or Specialist Appt within 3-5 Days Complete  HRI or [place] Complete  Social Work Consult for [place] Planning/Counseling Complete  Palliative  Care Screening Not Applicable  Medication Review Press photographer) Complete

## 2022-02-14 NOTE — Progress Notes (Signed)
[place] at [place] NAME: Jay Morris    MR#:  [id number]  DATE OF BIRTH:  08-01-1945  SUBJECTIVE:   came in with increasing shortness of breath cough fever 103 after he went home from hemorrhoidectomy surgery that was scheduled earlier in the day. Patient feels short winded and becomes very hypoxic on exertion. Uses chronic oxygen at home  Wife at bedside. Pt on HFNC 8 L and breathing comfortably   VITALS:  Blood pressure (!) 153/69, pulse 97, temperature 98 F (36.7 C), resp. rate 20, weight 82.6 kg, SpO2 98 %.  PHYSICAL EXAMINATION:   GENERAL:  76 y.o.-year-old patient lying in the bed with no acute distress.  LUNGS: distant breath sounds bilaterally, no wheezing, rales, rhonchi.  CARDIOVASCULAR: S1, S2 normal. No murmurs, rubs, or gallops. Tachycardia ABDOMEN: Soft, nontender, nondistended. Bowel sounds present.  EXTREMITIES: No  edema b/l.    NEUROLOGIC: nonfocal  patient is alert and awake SKIN: No obvious rash, lesion, or ulcer.   LABORATORY PANEL:  CBC Recent Labs  Lab 02/13/22 0639  WBC 27.4*  HGB 8.5*  HCT 26.8*  PLT 218     Chemistries  Recent Labs  Lab 02/12/22 2209 02/13/22 0639  NA 140 138  K 3.9 4.7  CL 105 106  CO2 25 26  GLUCOSE 103* 140*  BUN 26* 26*  CREATININE 2.19* 2.33*  CALCIUM 8.6* 8.1*  AST 23  --   ALT 21  --   ALKPHOS 34*  --   BILITOT 1.1  --     Cardiac Enzymes No results for input(s): "TROPONINI" in the last 168 hours. RADIOLOGY:  DG Chest Port 1 View  Result Date: 02/12/2022 CLINICAL DATA:  Possible sepsis EXAM: PORTABLE CHEST 1 VIEW COMPARISON:  CT 10/03/2019, chest x-ray 08/03/2021, 06/23/2019, 01/04/2016 FINDINGS: Diffuse bilateral coarse reticular interstitial opacity consistent with fibrosis and chronic lung disease. Increased bilateral patchy opacities compared to prior suggesting acute superimposed infectious or inflammatory process. Stable cardiomediastinal silhouette  with aortic atherosclerosis. No pneumothorax IMPRESSION: Extensive chronic lung disease and fibrosis. Increased bilateral patchy airspace opacities suspicious for superimposed acute infectious or inflammatory process Electronically Signed   By: Donavan Foil M.D.   On: 02/12/2022 22:46    Assessment and Plan Alok Minshall is a 76 y.o. Caucasian male with medical history significant for BPH, stage IV CKD, COPD, coronary artery disease, DM 2, GERD, hypertension and dyslipidemia, who underwent hemorrhoidectomy earlier yesterday.  During the evening he experienced dry cough with associated dyspnea and wheezing.  He was noted to have fever and chills.  He admitted to nausea with dry heaves without abdominal pain  Portable chest x-ray showed extensive chronic lung disease and fibrosis with increased bilateral patchy airspace opacities suspicious for superimposed acute infectious or inflammatory process.  Severe sepsis secondary to pneumonia suspect aspiration with recent surgery performed 02/11/22 -- patient came in with increasing shortness of breath, tachycardia, fever 103.2,, tachypnea, elevated white count of 27, lactic acid 2.7-->2.6 -- chest x-ray shows bilateral patches airspace opacity superimposed infection -- patient received broad-spectrum antibiotic per sepsis protocol -- continue IV unasyn and azithromycin -- blood culture  and urine culture is negative --procalcitonin 5.50  Acute on chronic hypoxic respiratory failure secondary to chronic interstitial lung disease with chronic pulmonary hypertension and chronic home oxygen == patient follows with Dr. Raul Del -- continue IV Solu-Medrol, inhalers, bronchodilator nebs --consult dr Raul Del -- continue home meds and nebulizer for pulmonary hypertension --pt currently on  8 L HFNC  Pulmonary hypertension, moderate to severe ([place]) -  continue Revatio and Tyvaso.   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 --  Semglee and Novolog tid with meals  Hypertension -- continue Lisinopril and lasix  CKD stage IV --Baseline creatinine around 2.3. Stable  paroxysmal atrial fibrillation -- continue eliquis  Procedures:none Family communication :wife Consults :[facility]--dr fleming CODE STATUS: FULL--confirmed with pt DVT Prophylaxis :eliquis Level of care: Progressive Status is: Inpatient Remains inpatient appropriate because: admitted with severe sepsis due to aspiration pneumonia and acute on chronic hypoxic respiratory failure. Patient still requiring high amount of oxygen.     TOTAL TIME TAKING CARE OF THIS PATIENT: 35 minutes.  >50% time spent on counselling and coordination of care  Note: This dictation was prepared with Dragon dictation along with smaller phrase technology. Any transcriptional errors that result from this process are unintentional.  Fritzi Mandes M.D    [facility]   CC: Primary care physician; [name], MD

## 2022-02-14 NOTE — Progress Notes (Signed)
I was alerted by a post-operative nurse that this patient was re-admitted to the hospital the same day after discharge from a hemorrhoidectomy. I was one of the anesthesiologists involved in his care.  I spoke to the patient and his family at bedside, expressed empathy at his situation. I reiterated to everyone that there were no notable events intraoperatively or postoperatively, with no documented vomiting or aspiration. Vital signs intraoperatively were kept normal, and his vital signs were normal upon discharge (no fever, maintaining SpO2 with his baseline O2 requirement). Patient himself said he felt fine on discharge. I did remind the patient and his family that I did consent him as being higher risk of complications due to his severe respiratory disease. I suggested we may not have a concrete answer as to the reason for his current acute respiratory disease exacerbation, but that aspiration, COPD exacerbation, concurrent and coincidental infection are all possibilities. Patient and his family were thankful.

## 2022-02-15 DIAGNOSIS — E1121 Type 2 diabetes mellitus with diabetic nephropathy: Secondary | ICD-10-CM | POA: Diagnosis not present

## 2022-02-15 DIAGNOSIS — I272 Pulmonary hypertension, unspecified: Secondary | ICD-10-CM | POA: Diagnosis not present

## 2022-02-15 DIAGNOSIS — J449 Chronic obstructive pulmonary disease, unspecified: Secondary | ICD-10-CM | POA: Diagnosis not present

## 2022-02-15 DIAGNOSIS — J189 Pneumonia, unspecified organism: Secondary | ICD-10-CM | POA: Diagnosis not present

## 2022-02-15 LAB — CBC
HCT: 25.4 % — ABNORMAL LOW (ref 39.0–52.0)
Hemoglobin: 8.1 g/dL — ABNORMAL LOW (ref 13.0–17.0)
MCH: 31 pg (ref 26.0–34.0)
MCHC: 31.9 g/dL (ref 30.0–36.0)
MCV: 97.3 fL (ref 80.0–100.0)
Platelets: 224 10*3/uL (ref 150–400)
RBC: 2.61 MIL/uL — ABNORMAL LOW (ref 4.22–5.81)
RDW: 15.6 % — ABNORMAL HIGH (ref 11.5–15.5)
WBC: 15.1 10*3/uL — ABNORMAL HIGH (ref 4.0–10.5)
nRBC: 0 % (ref 0.0–0.2)

## 2022-02-15 LAB — BASIC METABOLIC PANEL
Anion gap: 8 (ref 5–15)
BUN: 37 mg/dL — ABNORMAL HIGH (ref 8–23)
CO2: 26 mmol/L (ref 22–32)
Calcium: 8.9 mg/dL (ref 8.9–10.3)
Chloride: 104 mmol/L (ref 98–111)
Creatinine, Ser: 2.25 mg/dL — ABNORMAL HIGH (ref 0.61–1.24)
GFR, Estimated: 29 mL/min — ABNORMAL LOW (ref >60)
Glucose, Bld: 229 mg/dL — ABNORMAL HIGH (ref 70–99)
Potassium: 4.6 mmol/L (ref 3.5–5.1)
Sodium: 138 mmol/L (ref 135–145)

## 2022-02-15 LAB — BRAIN NATRIURETIC PEPTIDE: B Natriuretic Peptide: 485.2 pg/mL — ABNORMAL HIGH (ref 0.0–100.0)

## 2022-02-15 LAB — GLUCOSE, CAPILLARY
Glucose-Capillary: 201 mg/dL — ABNORMAL HIGH (ref 70–99)
Glucose-Capillary: 343 mg/dL — ABNORMAL HIGH (ref 70–99)
Glucose-Capillary: 321 mg/dL — ABNORMAL HIGH (ref 70–99)
Glucose-Capillary: 212 mg/dL — ABNORMAL HIGH (ref 70–99)

## 2022-02-15 LAB — SURGICAL PATHOLOGY

## 2022-02-15 MED ORDER — DOCUSATE SODIUM 100 MG PO CAPS
100.0000 mg | ORAL_CAPSULE | Freq: Two times a day (BID) | ORAL | Status: DC
  Administered 2022-02-15 – 2022-02-19 (×7): 100 mg via ORAL
  Filled 2022-02-15 (×8): qty 1

## 2022-02-15 MED ORDER — INSULIN GLARGINE-YFGN 100 UNIT/ML ~~LOC~~ SOLN
65.0000 [IU] | Freq: Every day | SUBCUTANEOUS | Status: DC
  Administered 2022-02-16 – 2022-02-17 (×2): 65 [IU] via SUBCUTANEOUS
  Filled 2022-02-15 (×2): qty 0.65

## 2022-02-15 MED ORDER — POLYETHYLENE GLYCOL 3350 17 G PO PACK
17.0000 g | PACK | Freq: Every day | ORAL | Status: DC
  Administered 2022-02-15 – 2022-02-18 (×4): 17 g via ORAL
  Filled 2022-02-15 (×5): qty 1

## 2022-02-15 MED ORDER — INSULIN ASPART 100 UNIT/ML IJ SOLN
30.0000 [IU] | Freq: Every day | INTRAMUSCULAR | Status: DC
  Administered 2022-02-16 – 2022-02-18 (×3): 30 [IU] via SUBCUTANEOUS
  Filled 2022-02-15 (×3): qty 1

## 2022-02-15 MED ORDER — LIDOCAINE 4 % EX CREA
TOPICAL_CREAM | Freq: Three times a day (TID) | CUTANEOUS | Status: DC | PRN
  Filled 2022-02-15: qty 5

## 2022-02-15 MED ORDER — INSULIN ASPART 100 UNIT/ML IJ SOLN
30.0000 [IU] | Freq: Every day | INTRAMUSCULAR | Status: DC
  Administered 2022-02-15 – 2022-02-18 (×4): 30 [IU] via SUBCUTANEOUS
  Filled 2022-02-15 (×4): qty 1

## 2022-02-15 MED ORDER — INSULIN ASPART 100 UNIT/ML IJ SOLN
24.0000 [IU] | Freq: Every day | INTRAMUSCULAR | Status: DC
  Administered 2022-02-16 – 2022-02-19 (×4): 24 [IU] via SUBCUTANEOUS
  Filled 2022-02-15 (×4): qty 1

## 2022-02-15 MED ORDER — POLYETHYLENE GLYCOL 3350 17 G PO PACK: 17.0000 g | PACK | Freq: Every day | ORAL | Status: DC | PRN

## 2022-02-15 NOTE — Progress Notes (Addendum)
[place] at [place] NAME: Jay Morris    MR#:  [id number]  DATE OF BIRTH:  09-02-1945  SUBJECTIVE:   came in with increasing shortness of breath cough fever 103 after he went home from hemorrhoidectomy surgery that was scheduled earlier in the day. Patient feels short winded and becomes very hypoxic on exertion. Uses chronic oxygen at home  Wife at bedside. Pt on HFNC 5 L and breathing comfortably   VITALS:  Blood pressure 136/64, pulse 98, temperature 97.8 F (36.6 C), resp. rate 18, height _0  (1.727 m), weight 82.6 kg, SpO2 97 %.  PHYSICAL EXAMINATION:   GENERAL:  76 y.o.-year-old patient lying in the bed with no acute distress.  LUNGS: distant breath sounds bilaterally, fine rales,no rhonchi.  CARDIOVASCULAR: S1, S2 normal. No murmurs, rubs, or gallops. Tachycardia ABDOMEN: Soft, nontender, nondistended. Bowel sounds present.  EXTREMITIES: No  edema b/l.    NEUROLOGIC: nonfocal  patient is alert and awake SKIN: No obvious rash, lesion, or ulcer.   LABORATORY PANEL:  CBC Recent Labs  Lab 02/15/22 0608  WBC 15.1*  HGB 8.1*  HCT 25.4*  PLT 224     Chemistries  Recent Labs  Lab 02/12/22 2209 02/13/22 0639 02/15/22 0608  NA 140   < > 138  K 3.9   < > 4.6  CL 105   < > 104  CO2 25   < > 26  GLUCOSE 103*   < > 229*  BUN 26*   < > 37*  CREATININE 2.19*   < > 2.25*  CALCIUM 8.6*   < > 8.9  AST 23  --   --   ALT 21  --   --   ALKPHOS 34*  --   --   BILITOT 1.1  --   --    < > = values in this interval not displayed.    Cardiac Enzymes No results for input(s): "TROPONINI" in the last 168 hours. RADIOLOGY:  No results found.  Assessment and Plan Jay Morris is a 76 y.o. Caucasian male with medical history significant for BPH, stage IV CKD, COPD, coronary artery disease, DM 2, GERD, hypertension and dyslipidemia, who underwent hemorrhoidectomy earlier yesterday.  During the evening he experienced dry cough  with associated dyspnea and wheezing.  He was noted to have fever and chills.  He admitted to nausea with dry heaves without abdominal pain  Portable chest x-ray showed extensive chronic lung disease and fibrosis with increased bilateral patchy airspace opacities suspicious for superimposed acute infectious or inflammatory process.  Severe sepsis secondary to pneumonia suspect aspiration with h/o recent surgery performed 02/11/22 -- patient came in with increasing shortness of breath, tachycardia, fever 103.2,, tachypnea, elevated white count of 27, lactic acid 2.7-->2.6 -- chest x-ray shows bilateral patches airspace opacity superimposed infection -- patient received broad-spectrum antibiotic per sepsis protocol -- continue IV unasyn and azithromycin -- blood culture  and urine culture is negative --procalcitonin 5.50  Acute on chronic hypoxic respiratory failure secondary to chronic interstitial lung disease with chronic pulmonary hypertension and chronic home oxygen -- patient follows with Dr. Raul Del -- continue IV Solu-Medrol, inhalers, bronchodilator nebs --consult dr Raul Del -- continue home meds and nebulizer for pulmonary hypertension --pt currently on 5 L HFNC  Pulmonary hypertension, moderate to severe ([place]) -  continue Revatio and Tyvaso.   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 -- Semglee and Novolog tid with  meals  Recent Hemorrhoidectomy surgery --cont bowel regimen per Dr Lysle Pearl --avoid constipation  Hypertension -- continue Lisinopril and lasix  CKD stage IV --Baseline creatinine around 2.3. Stable  paroxysmal atrial fibrillation -- continue eliquis  Procedures:none Family communication :wife Consults :[facility]--dr fleming CODE STATUS: FULL--confirmed with pt DVT Prophylaxis :eliquis Level of care: Progressive Status is: Inpatient Remains inpatient appropriate because: admitted with severe sepsis due to aspiration pneumonia  and acute on chronic hypoxic respiratory failure. Patient still requiring high amount of oxygen.     TOTAL TIME TAKING CARE OF THIS PATIENT: 35 minutes.  >50% time spent on counselling and coordination of care  Note: This dictation was prepared with Dragon dictation along with smaller phrase technology. Any transcriptional errors that result from this process are unintentional.  Fritzi Mandes M.D    [facility]   CC: Primary care physician; [name], MD

## 2022-02-15 NOTE — Plan of Care (Signed)

## 2022-02-15 NOTE — Consult Note (Signed)
Pulmonary Medicine          Date: 02/15/2022,   MRN# [id number] Jay Morris [birth year]     AdmissionWeight: 82.6 kg                 CurrentWeight: 82.6 kg      CHIEF COMPLAINT:   Sob,abn cxr   HISTORY OF PRESENT ILLNESS   This is a 76 y old male, well known to my service. Pulmonary ise known to have pulmonary fibrosis, pulmonary htn, copd, asbestos exposure, renal insufficiency. On portable oxygen.  S/p recent hemorrhoid surgery.   Presented to [facility] with  increasing dyspnea, cough and fever. His car was described as:  DG Chest Port 1 View Result Date: 02/12/2022 CLINICAL DATA:  Possible sepsis EXAM: PORTABLE CHEST 1 VIEW COMPARISON:  CT 10/03/2019, chest x-ray 08/03/2021, 06/23/2019, 01/04/2016 FINDINGS: Diffuse bilateral coarse reticular interstitial opacity consistent with fibrosis and chronic lung disease. Increased bilateral patchy opacities compared to prior suggesting acute superimposed infectious or inflammatory process. Stable cardiomediastinal silhouette with aortic atherosclerosis. No pneumothorax IMPRESSION: Extensive chronic lung disease and fibrosis. Increased bilateral patchy airspace opacities suspicious for superimposed acute infectious or inflammatory process Electronically Signed   By: Donavan Foil M.D.   On: 02/12/2022 22:46    No pleurisy, hemoptysis, advancing peripheral edema.  PAST MEDICAL HISTORY   Past Medical History:  Diagnosis Date   (HFpEF) heart failure with preserved ejection fraction ([name])    a.) TTE 04/01/2016: EF 60-65%, LAE, MAC, mild MR/AR/TR, mild-mod PR, G2DD; b.) TTE 12/18/2018: EF 55%, mild LVH, mild AR/MR/TR. mod PR; c.) TTE 05/31/2019: EF 60-65%, mild LVH, RAE/RVE, triv AR.MT, PR, mild TR; d.) TTE 10/26/2019: EF 55%, BAE, RVE, triv AR, mild MR/PR, sev TR, G2DD; e.) R/LHC 01/17/2020: mPA 52, PCWP 25, LVEDP 19, RVEDP 25, mRA 13   Anemia    Aortic atherosclerosis (HCC)    Asthma    BPH (benign prostatic hyperplasia)    CKD  (chronic kidney disease), stage III (HCC)    COPD (chronic obstructive pulmonary disease) ([place])    Coronary artery disease 01/17/2020   a.) LHC 01/17/2020: 25% oLAD, 65% o-pRCA, 45% pLAD, 65% mLAD, 35% OM2 --> med mgmt.   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   PAF (paroxysmal atrial fibrillation) (HCC)    a.) CHA2DS2-VASc = 6 (age x 2, HFpEF, HTN, vascular disease, T2DM);  b.) cardiac ablation in approx 2014-2015 at [facility]; c.) A.fib recurrence in setting of SARS-CoV-2 --> Tx'd with IV amiodarone + metoprolol + DCCV x 1 (200 J) 06/22/2019 restoring NSR; d.) rate/rhythm maintained without pharmacological intervention; chronically anticoagulated using standard dose apixaban   Pneumonia due to COVID-19 virus 06/02/2019   a.) hospitalized at [facility]) from 06/02/2019 - 06/25/2019; Tx'd with steriods + remdesivir + convalescent plasma   Pulmonary fibrosis ([place])    Pulmonary HTN ([place]) 04/01/2016   a.) TTE 04/01/2016: EF 60-65%, RVSP 40; b.) TTE 12/18/2018: EF 55%, RVSP 45.9; c.) TTE 05/31/2019: EF 60-65%, RVSP 35.5; d.) TTE 10/26/2019: EF 55%, RVSP 100.4; e.) R/LHC 01/17/2020:  mPA 52, PCWP 25, LVEDP 19, RVEDP 25, mRA 13; f.) Tx with PDE5i (sildenafil) + vasodilator (treprostinil)   Type 2 diabetes mellitus treated with insulin ([name])      SURGICAL HISTORY   Past Surgical History:  Procedure Laterality Date   BACK SURGERY     x3 L3-L4    CARDIAC ELECTROPHYSIOLOGY MAPPING AND ABLATION N/A    performed at [place] in approximately 2014-2015   CARDIOVERSION N/A 06/22/2019   Procedure: CARDIOVERSION;  Surgeon:  Fay Records, MD;  Location: [place];  Service: Cardiovascular;  Laterality: N/A;   COLONOSCOPY WITH PROPOFOL N/A 06/15/2017   Procedure: COLONOSCOPY WITH PROPOFOL;  Surgeon: Toledo, Benay Pike, MD;  Location: [facility] ENDOSCOPY;  Service: Gastroenterology;  Laterality: N/A;   ENTEROSCOPY N/A 11/24/2017   Procedure: ENTEROSCOPY;  Surgeon: Jonathon Bellows, MD;  Location: [facility] ENDOSCOPY;  Service: Gastroenterology;  Laterality: N/A;   ESOPHAGOGASTRODUODENOSCOPY N/A 11/27/2017   Procedure: ESOPHAGOGASTRODUODENOSCOPY (EGD);  Surgeon: Lucilla Lame, MD;  Location: [facility] ENDOSCOPY;  Service: Endoscopy;  Laterality: N/A;   ESOPHAGOGASTRODUODENOSCOPY (EGD) WITH PROPOFOL N/A 06/02/2017   Procedure: ESOPHAGOGASTRODUODENOSCOPY (EGD) WITH PROPOFOL;  Surgeon: Toledo, Benay Pike, MD;  Location: [facility] ENDOSCOPY;  Service: Gastroenterology;  Laterality: N/A;   ESOPHAGOGASTRODUODENOSCOPY (EGD) WITH PROPOFOL N/A 08/04/2021   Procedure: ESOPHAGOGASTRODUODENOSCOPY (EGD) WITH PROPOFOL;  Surgeon: Lesly Rubenstein, MD;  Location: [facility] ENDOSCOPY;  Service: Endoscopy;  Laterality: N/A;   EVALUATION UNDER ANESTHESIA WITH HEMORRHOIDECTOMY N/A 02/12/2022   Procedure: EXAM UNDER ANESTHESIA WITH HEMORRHOIDECTOMY;  Surgeon: Benjamine Sprague, DO;  Location: [facility] ORS;  Service: General;  Laterality: N/A;   GIVENS CAPSULE STUDY N/A 11/25/2017   Procedure: GIVENS CAPSULE STUDY;  Surgeon: Jonathon Bellows, MD;  Location: [facility] ENDOSCOPY;  Service: Gastroenterology;  Laterality: N/A;   HAND SURGERY     RIGHT/LEFT HEART CATH AND CORONARY ANGIOGRAPHY N/A 01/17/2020   Procedure: RIGHT/LEFT HEART CATH AND CORONARY ANGIOGRAPHY;  Surgeon: Corey Skains, MD;  Location: [place] CV LAB;  Service: Cardiovascular;  Laterality: N/A;     FAMILY HISTORY   Family History  Problem Relation Age of Onset   Heart disease Mother    Cancer Father    Cancer Paternal Grandfather      SOCIAL HISTORY   Social History   Tobacco Use   Smoking  status: Former    Packs/day: 1.00    Years: 30.00    Total pack years: 30.00    Types: Cigarettes    Quit date: 1995    Years since quitting: 28.5   Smokeless tobacco: Never  Vaping Use   Vaping Use: Never used  Substance Use Topics   Alcohol use: No    Alcohol/week: 0.0 standard drinks of alcohol    Comment: occasional   Drug use: No     MEDICATIONS    Home Medication:    Current Medication:  Current Facility-Administered Medications:    acetaminophen (TYLENOL) tablet 650 mg, 650 mg, Oral, Q6H PRN, 650 mg at 02/13/22 2156 **OR** acetaminophen (TYLENOL) suppository 650 mg, 650 mg, Rectal, Q6H PRN, Mansy, Jan A, MD   albuterol (PROVENTIL) (2.5 MG/3ML) 0.083% nebulizer solution 3 mL, 3 mL, Inhalation, Q6H PRN, Mansy, Jan A, MD, 3 mL at 02/13/22 1444   allopurinol (ZYLOPRIM) tablet 200 mg, 200 mg, Oral, Daily, Mansy, Jan A, MD, 200 mg at 02/15/22 1041   Ampicillin-Sulbactam (UNASYN) 3 g in sodium chloride 0.9 % 100 mL IVPB, 3 g, Intravenous, Q12H, Mansy, Jan A, MD, Last Rate: 200 mL/hr at 02/15/22 1050, 3 g at  02/15/22 1050   apixaban (ELIQUIS) tablet 5 mg, 5 mg, Oral, BID, Mansy, Jan A, MD, 5 mg at 02/14/22 2137   ascorbic acid (VITAMIN C) tablet 500 mg, 500 mg, Oral, Daily, Mansy, Jan A, MD, 500 mg at 02/15/22 1042   atorvastatin (LIPITOR) tablet 20 mg, 20 mg, Oral, QHS, Mansy, Jan A, MD, 20 mg at 02/14/22 2137   azithromycin (ZITHROMAX) 500 mg in sodium chloride 0.9 % 250 mL IVPB, 500 mg, Intravenous, Q24H, Mansy, Jan A, MD, Stopped at 02/15/22 8250   chlorpheniramine-HYDROcodone 10-8 MG/5ML suspension 5 mL, 5 mL, Oral, Q12H PRN, Mansy, Jan A, MD   cholecalciferol (VITAMIN D3) 25 MCG (1000 UNIT) tablet 5,000 Units, 5,000 Units, Oral, Daily, Mansy, Jan A, MD, 5,000 Units at 02/15/22 1041   cyanocobalamin (VITAMIN B12) tablet 1,000 mcg, 1,000 mcg, Oral, Daily, Mansy, Jan A, MD, 1,000 mcg at 02/15/22 1042   docusate sodium (COLACE) capsule 100 mg, 100 mg, Oral, BID, Sakai, Isami,  DO, 100 mg at 02/15/22 1159   finasteride (PROSCAR) tablet 5 mg, 5 mg, Oral, Daily, Mansy, Jan A, MD, 5 mg at 02/15/22 1041   fluticasone furoate-vilanterol (BREO ELLIPTA) 100-25 MCG/ACT 1 puff, 1 puff, Inhalation, Daily, Fritzi Mandes, MD, 1 puff at 02/15/22 1051   furosemide (LASIX) tablet 40 mg, 40 mg, Oral, Daily, Mansy, Jan A, MD, 40 mg at 02/15/22 1045   guaiFENesin (MUCINEX) 12 hr tablet 600 mg, 600 mg, Oral, BID, Mansy, Jan A, MD, 600 mg at 02/15/22 1041   insulin aspart (novoLOG) injection 0-5 Units, 0-5 Units, Subcutaneous, QHS, Posey Pronto, Sona, MD   insulin aspart (novoLOG) injection 0-9 Units, 0-9 Units, Subcutaneous, TID WC, Fritzi Mandes, MD, 7 Units at 02/15/22 1159   insulin aspart (novoLOG) injection 20 Units, 20 Units, Subcutaneous, Q breakfast, Fritzi Mandes, MD, 20 Units at 02/15/22 0849   insulin aspart (novoLOG) injection 26 Units, 26 Units, Subcutaneous, Q lunch, Fritzi Mandes, MD, 26 Units at 02/15/22 1200   insulin aspart (novoLOG) injection 26 Units, 26 Units, Subcutaneous, Q supper, Fritzi Mandes, MD, 26 Units at 02/14/22 1804   insulin glargine-yfgn (SEMGLEE) injection 60 Units, 60 Units, Subcutaneous, Daily, Fritzi Mandes, MD, 60 Units at 02/15/22 1046   ipratropium-albuterol (DUONEB) 0.5-2.5 (3) MG/3ML nebulizer solution 3 mL, 3 mL, Nebulization, TID, Fritzi Mandes, MD, 3 mL at 02/15/22 0749   lidocaine (LMX) 4 % cream, , Topical, TID PRN, Sakai, Isami, DO   lisinopril (ZESTRIL) tablet 10 mg, 10 mg, Oral, QHS, Mansy, Jan A, MD, 10 mg at 02/14/22 2137   magnesium hydroxide (MILK OF MAGNESIA) suspension 30 mL, 30 mL, Oral, Daily PRN, Mansy, Jan A, MD   magnesium oxide (MAG-OX) tablet 400 mg, 400 mg, Oral, TID, Mansy, Jan A, MD, 400 mg at 02/15/22 1042   methylPREDNISolone sodium succinate (SOLU-MEDROL) 40 mg/mL injection 40 mg, 40 mg, Intravenous, Q12H, Fritzi Mandes, MD, 40 mg at 02/15/22 0441   ondansetron (ZOFRAN) tablet 4 mg, 4 mg, Oral, Q6H PRN **OR** ondansetron (ZOFRAN) injection 4  mg, 4 mg, Intravenous, Q6H PRN, Mansy, Jan A, MD   oxyCODONE (Oxy IR/ROXICODONE) immediate release tablet 5 mg, 5 mg, Oral, Q8H PRN, Mansy, Jan A, MD   pantoprazole (PROTONIX) EC tablet 40 mg, 40 mg, Oral, BID AC, Mansy, Jan A, MD, 40 mg at 02/15/22 1041   polyethylene glycol (MIRALAX / GLYCOLAX) packet 17 g, 17 g, Oral, Daily PRN, Sakai, Isami, DO   sildenafil (REVATIO) tablet 20 mg, 20 mg, Oral, TID, Mansy, Arvella Merles, MD, 20  mg at 02/15/22 1042   traZODone (DESYREL) tablet 25 mg, 25 mg, Oral, QHS PRN, Mansy, Jan A, MD, 25 mg at 02/13/22 2156   Treprostinil (TYVASO) inhalation solution 18 mcg, 18 mcg, Inhalation, QID, Mansy, Jan A, MD, 18 mcg at 02/15/22 1115    ALLERGIES   Shrimp [shellfish allergy] and Shrimp extract allergy skin test     REVIEW OF SYSTEMS    Review of Systems:  Gen:  Denies  fever, sweats, chills weigh loss  HEENT: Denies blurred vision, double vision, ear pain, eye pain, hearing loss, nose bleeds, sore throat Cardiac:  No dizziness, chest pain or heaviness, chest tightness,edema Resp:   Denies cough or sputum porduction, shortness of breath,wheezing, hemoptysis,  Gi: Denies swallowing difficulty, stomach pain, nausea or vomiting, diarrhea, constipation, bowel incontinence Gu:  Denies bladder incontinence, burning urine Ext:   Denies Joint pain, stiffness or swelling Skin: Denies  skin rash, easy bruising or bleeding or hives Endoc:  Denies polyuria, polydipsia , polyphagia or weight change Psych:   Denies depression, insomnia or hallucinations   Other:  All other systems negative   VS: BP 136/64 (BP Location: Left Arm)   Pulse 98   Temp 97.8 F (36.6 C)   Resp 18   Ht _0  (1.727 m)   Wt 82.6 kg   SpO2 94%   BMI 27.69 kg/m      PHYSICAL EXAM    GENERAL:NAD, no fevers, chills, no weakness no fatigue HEAD: Normocephalic, atraumatic.  EYES: Pupils equal, round, reactive to light. Extraocular muscles intact. No scleral icterus.  MOUTH: Moist  mucosal membrane. Dentition intact. No abscess noted.  EAR, NOSE, THROAT: Clear without exudates. No external lesions.  NECK: Supple. No thyromegaly. No nodules. No JVD.  PULMONARY: + crackles CARDIOVASCULAR: S1 and S2. Regular rate and rhythm. No murmurs, rubs, or gallops. No edema. Pedal pulses 2+ bilaterally.  GASTROINTESTINAL: Soft, nontender, nondistended. No masses. Positive bowel sounds. No hepatosplenomegaly.  MUSCULOSKELETAL: No swelling, clubbing, or edema. Range of motion full in all extremities.  NEUROLOGIC: Cranial nerves II through XII are intact. No gross focal neurological deficits. Sensation intact. Reflexes intact.  SKIN: No ulceration, lesions, rashes, or cyanosis. Skin warm and dry. Turgor intact.  PSYCHIATRIC: Mood, affect within normal limits. The patient is awake, alert and oriented x 3. Insight, judgment intact.       IMAGING    DG Chest Port 1 View  Result Date: 02/12/2022 CLINICAL DATA:  Possible sepsis EXAM: PORTABLE CHEST 1 VIEW COMPARISON:  CT 10/03/2019, chest x-ray 08/03/2021, 06/23/2019, 01/04/2016 FINDINGS: Diffuse bilateral coarse reticular interstitial opacity consistent with fibrosis and chronic lung disease. Increased bilateral patchy opacities compared to prior suggesting acute superimposed infectious or inflammatory process. Stable cardiomediastinal silhouette with aortic atherosclerosis. No pneumothorax IMPRESSION: Extensive chronic lung disease and fibrosis. Increased bilateral patchy airspace opacities suspicious for superimposed acute infectious or inflammatory process Electronically Signed   By: Donavan Foil M.D.   On: 02/12/2022 22:46      ASSESSMENT/PLAN   This is a 76 yr old male,who present with known hx of copd, pulm htn, on treatment that has helped, and pulmonary fibrosis. His cxr suggest a superimpose patchy infiltrates. Suspect pneumonia ? Aspiration ve hcap that you are treating. However, part of the differential is an IPF flare ( doubt  thus far)  He does not appear to be wet.     Full note to follow     Thank you for allowing me to participate in the care  of this patient.   Patient/Family are satisfied with care plan and all questions have been answered.  This document was prepared using Dragon voice recognition software and may include unintentional dictation errors.     Wallene Huh, M.D.  Division of [facility]

## 2022-02-16 DIAGNOSIS — E1121 Type 2 diabetes mellitus with diabetic nephropathy: Secondary | ICD-10-CM | POA: Diagnosis not present

## 2022-02-16 DIAGNOSIS — J449 Chronic obstructive pulmonary disease, unspecified: Secondary | ICD-10-CM | POA: Diagnosis not present

## 2022-02-16 DIAGNOSIS — J189 Pneumonia, unspecified organism: Secondary | ICD-10-CM | POA: Diagnosis not present

## 2022-02-16 DIAGNOSIS — I272 Pulmonary hypertension, unspecified: Secondary | ICD-10-CM | POA: Diagnosis not present

## 2022-02-16 LAB — GLUCOSE, CAPILLARY
Glucose-Capillary: 245 mg/dL — ABNORMAL HIGH (ref 70–99)
Glucose-Capillary: 170 mg/dL — ABNORMAL HIGH (ref 70–99)

## 2022-02-16 MED ORDER — FUROSEMIDE 40 MG PO TABS
40.0000 mg | ORAL_TABLET | Freq: Every day | ORAL | Status: DC
  Administered 2022-02-17 – 2022-02-19 (×3): 40 mg via ORAL
  Filled 2022-02-16 (×3): qty 1

## 2022-02-16 MED ORDER — FUROSEMIDE 10 MG/ML IJ SOLN: 40.0000 mg | Freq: Two times a day (BID) | INTRAMUSCULAR | Status: DC

## 2022-02-16 MED ORDER — AZITHROMYCIN 250 MG PO TABS
500.0000 mg | ORAL_TABLET | Freq: Every day | ORAL | Status: AC
Start: 2022-02-16 — End: 2022-02-17
  Administered 2022-02-16 – 2022-02-17 (×2): 500 mg via ORAL
  Filled 2022-02-16 (×2): qty 2

## 2022-02-16 MED ORDER — FUROSEMIDE 10 MG/ML IJ SOLN
40.0000 mg | Freq: Once | INTRAMUSCULAR | Status: AC
  Administered 2022-02-16: 40 mg via INTRAVENOUS
  Filled 2022-02-16: qty 4

## 2022-02-16 NOTE — Plan of Care (Signed)
  Problem: Education: Goal: Knowledge of General Education information will improve Description: Including pain rating scale, medication(s)/side effects and non-pharmacologic comfort measures Outcome: Progressing   Problem: Health Behavior/Discharge Planning: Goal: Ability to manage health-related needs will improve Outcome: Progressing   Problem: Clinical Measurements: Goal: Ability to maintain clinical measurements within normal limits will improve Outcome: Progressing Goal: Will remain free from infection Outcome: Progressing Goal: Diagnostic test results will improve Outcome: Progressing Goal: Respiratory complications will improve Outcome: Progressing Goal: Cardiovascular complication will be avoided Outcome: Progressing   Problem: Activity: Goal: Risk for activity intolerance will decrease Outcome: Progressing   Problem: Nutrition: Goal: Adequate nutrition will be maintained Outcome: Progressing   Problem: Coping: Goal: Level of anxiety will decrease Outcome: Progressing   Problem: Elimination: Goal: Will not experience complications related to bowel motility Outcome: Progressing   Problem: Pain Managment: Goal: General experience of comfort will improve Outcome: Progressing   Problem: Safety: Goal: Ability to remain free from injury will improve Outcome: Progressing   Problem: Skin Integrity: Goal: Risk for impaired skin integrity will decrease Outcome: Progressing   Problem: Fluid Volume: Goal: Hemodynamic stability will improve Outcome: Progressing   Problem: Clinical Measurements: Goal: Diagnostic test results will improve Outcome: Progressing Goal: Signs and symptoms of infection will decrease Outcome: Progressing   Problem: Respiratory: Goal: Ability to maintain adequate ventilation will improve Outcome: Progressing   Problem: Elimination: Goal: Will not experience complications related to urinary retention Outcome: Not Progressing

## 2022-02-16 NOTE — Progress Notes (Signed)
Pulmonary Medicine          Date: 02/16/2022,   MRN# [id number] Jay Morris March 22, 1946      HISTORY OF PRESENT ILLNESS  This is a 76 y old male, well known to my service. Pulmonary ise known to have pulmonary fibrosis, pulmonary htn, copd, asbestos exposure, renal insufficiency. On portable oxygen.  S/p recent hemorrhoid surgery.    Presented to [facility] with  increasing dyspnea, cough and fever. His cxr was described as:  DG Chest Port 1 View Result Date: 02/12/2022 CLINICAL DATA:  Possible sepsis EXAM: PORTABLE CHEST 1 VIEW COMPARISON:  CT 10/03/2019, chest x-ray 08/03/2021, 06/23/2019, 01/04/2016 FINDINGS: Diffuse bilateral coarse reticular interstitial opacity consistent with fibrosis and chronic lung disease. Increased bilateral patchy opacities compared to prior suggesting acute superimposed infectious or inflammatory process. Stable cardiomediastinal silhouette with aortic atherosclerosis. No pneumothorax IMPRESSION: Extensive chronic lung disease and fibrosis. Increased bilateral patchy airspace opacities suspicious for superimposed acute infectious or inflammatory process Electronically Signed   By: Donavan Foil M.D.   On: 02/12/2022 22:46    Much improved today fio2 down to 5 liters, ambulating to bath room. Strength increasing. Extra lasix given. Positive response     PAST MEDICAL HISTORY   Past Medical History:  Diagnosis Date   (HFpEF) heart failure with preserved ejection fraction ([place])    a.) TTE 04/01/2016: EF 60-65%, LAE, MAC, mild MR/AR/TR, mild-mod PR, G2DD; b.) TTE 12/18/2018: EF 55%, mild LVH, mild AR/MR/TR. mod PR; c.) TTE 05/31/2019: EF 60-65%, mild LVH, RAE/RVE, triv AR.MT, PR, mild TR; d.) TTE 10/26/2019: EF 55%, BAE, RVE, triv AR, mild MR/PR, sev TR, G2DD; e.) R/LHC 01/17/2020: mPA 52, PCWP 25, LVEDP 19, RVEDP 25, mRA 13   Anemia    Aortic atherosclerosis (HCC)    Asthma    BPH (benign prostatic hyperplasia)    CKD (chronic kidney disease), stage III  (HCC)    COPD (chronic obstructive pulmonary disease) ([place])    Coronary artery disease 01/17/2020   a.) LHC 01/17/2020: 25% oLAD, 65% o-pRCA, 45% pLAD, 65% mLAD, 35% OM2 --> med mgmt.   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   PAF (paroxysmal atrial fibrillation) (HCC)    a.) CHA2DS2-VASc = 6 (age x 2, HFpEF, HTN, vascular disease, T2DM);  b.) cardiac ablation in approx 2014-2015 at [facility]; c.) A.fib recurrence in setting of SARS-CoV-2 --> Tx'd with IV amiodarone + metoprolol + DCCV x 1 (200 J) 06/22/2019 restoring NSR; d.) rate/rhythm maintained without pharmacological intervention; chronically anticoagulated using standard dose apixaban   Pneumonia due to COVID-19 virus 06/02/2019   a.) hospitalized at [facility]) from 06/02/2019 - 06/25/2019; Tx'd with steriods + remdesivir + convalescent plasma   Pulmonary fibrosis ([place])    Pulmonary HTN ([place]) 04/01/2016   a.) TTE 04/01/2016: EF 60-65%, RVSP 40; b.) TTE 12/18/2018: EF 55%, RVSP 45.9; c.) TTE 05/31/2019: EF 60-65%, RVSP 35.5; d.) TTE 10/26/2019: EF 55%, RVSP 100.4; e.) R/LHC 01/17/2020: mPA 52, PCWP 25, LVEDP 19, RVEDP 25, mRA 13; f.) Tx with PDE5i (sildenafil) + vasodilator (treprostinil)  Type 2 diabetes mellitus treated with insulin ([place])      SURGICAL HISTORY   Past Surgical History:  Procedure Laterality Date   BACK SURGERY     x3 L3-L4    CARDIAC ELECTROPHYSIOLOGY MAPPING AND ABLATION N/A    performed at [facility] in approximately 2014-2015   CARDIOVERSION N/A 06/22/2019   Procedure: CARDIOVERSION;  Surgeon: Fay Records, MD;  Location: [facility];  Service: Cardiovascular;  Laterality: N/A;   COLONOSCOPY WITH PROPOFOL N/A 06/15/2017   Procedure: COLONOSCOPY WITH PROPOFOL;  Surgeon: Toledo, Benay Pike, MD;  Location: [facility] ENDOSCOPY;  Service: Gastroenterology;  Laterality: N/A;   ENTEROSCOPY N/A 11/24/2017   Procedure: ENTEROSCOPY;  Surgeon: Jonathon Bellows, MD;  Location: [facility] ENDOSCOPY;  Service: Gastroenterology;  Laterality: N/A;   ESOPHAGOGASTRODUODENOSCOPY N/A 11/27/2017   Procedure: ESOPHAGOGASTRODUODENOSCOPY (EGD);  Surgeon: Lucilla Lame, MD;  Location: [facility] ENDOSCOPY;  Service: Endoscopy;  Laterality: N/A;   ESOPHAGOGASTRODUODENOSCOPY (EGD) WITH PROPOFOL N/A 06/02/2017   Procedure: ESOPHAGOGASTRODUODENOSCOPY (EGD) WITH PROPOFOL;  Surgeon: Toledo, Benay Pike, MD;  Location: [facility] ENDOSCOPY;  Service: Gastroenterology;  Laterality: N/A;   ESOPHAGOGASTRODUODENOSCOPY (EGD) WITH PROPOFOL N/A 08/04/2021   Procedure: ESOPHAGOGASTRODUODENOSCOPY (EGD) WITH PROPOFOL;  Surgeon: Lesly Rubenstein, MD;  Location: [facility] ENDOSCOPY;  Service: Endoscopy;  Laterality: N/A;   EVALUATION UNDER ANESTHESIA WITH HEMORRHOIDECTOMY N/A 02/12/2022   Procedure: EXAM UNDER ANESTHESIA WITH HEMORRHOIDECTOMY;  Surgeon: Benjamine Sprague, DO;  Location: [facility] ORS;  Service: General;  Laterality: N/A;   GIVENS CAPSULE STUDY N/A 11/25/2017   Procedure: GIVENS CAPSULE STUDY;  Surgeon: Jonathon Bellows, MD;  Location: [facility] ENDOSCOPY;  Service: Gastroenterology;  Laterality: N/A;   HAND SURGERY     RIGHT/LEFT HEART CATH AND CORONARY ANGIOGRAPHY N/A 01/17/2020   Procedure: RIGHT/LEFT HEART CATH AND CORONARY ANGIOGRAPHY;  Surgeon: Corey Skains, MD;  Location: [facility] CV LAB;  Service: Cardiovascular;  Laterality: N/A;     FAMILY HISTORY   Family History  Problem Relation Age of Onset   Heart disease Mother    Cancer Father    Cancer Paternal Grandfather      SOCIAL HISTORY   Social History   Tobacco Use   Smoking status: Former    Packs/day: 1.00     Years: 30.00    Total pack years: 30.00    Types: Cigarettes    Quit date: 1995    Years since quitting: 28.6   Smokeless tobacco: Never  Vaping Use   Vaping Use: Never used  Substance Use Topics   Alcohol use: No    Alcohol/week: 0.0 standard drinks of alcohol    Comment: occasional   Drug use: No     MEDICATIONS    Home Medication:    Current Medication:  Current Facility-Administered Medications:    acetaminophen (TYLENOL) tablet 650 mg, 650 mg, Oral, Q6H PRN, 650 mg at 02/13/22 2156 **OR** acetaminophen (TYLENOL) suppository 650 mg, 650 mg, Rectal, Q6H PRN, Mansy, Jan A, MD   albuterol (PROVENTIL) (2.5 MG/3ML) 0.083% nebulizer solution 3 mL, 3 mL, Inhalation, Q6H PRN, Mansy, Jan A, MD, 3 mL at 02/16/22 1232   allopurinol (ZYLOPRIM) tablet 200 mg, 200 mg, Oral, Daily, Mansy, Jan A, MD, 200 mg at 02/16/22 0852   Ampicillin-Sulbactam (UNASYN) 3 g in sodium chloride 0.9 % 100 mL IVPB, 3 g, Intravenous, Q12H, Fritzi Mandes, MD, Last Rate: 200 mL/hr at 02/16/22 0849, 3 g at 02/16/22 0849   apixaban (ELIQUIS) tablet 5 mg, 5 mg, Oral, BID, Mansy, Jan A, MD, 5 mg at 02/16/22  0852   ascorbic acid (VITAMIN C) tablet 500 mg, 500 mg, Oral, Daily, Mansy, Jan A, MD, 500 mg at 02/16/22 9458   atorvastatin (LIPITOR) tablet 20 mg, 20 mg, Oral, QHS, Mansy, Jan A, MD, 20 mg at 02/15/22 2143   azithromycin (ZITHROMAX) tablet 500 mg, 500 mg, Oral, Daily, Fritzi Mandes, MD   chlorpheniramine-HYDROcodone 10-8 MG/5ML suspension 5 mL, 5 mL, Oral, Q12H PRN, Mansy, Jan A, MD   cholecalciferol (VITAMIN D3) 25 MCG (1000 UNIT) tablet 5,000 Units, 5,000 Units, Oral, Daily, Mansy, Jan A, MD, 5,000 Units at 02/16/22 5929   cyanocobalamin (VITAMIN B12) tablet 1,000 mcg, 1,000 mcg, Oral, Daily, Mansy, Jan A, MD, 1,000 mcg at 02/16/22 0853   docusate sodium (COLACE) capsule 100 mg, 100 mg, Oral, BID, Sakai, Isami, DO, 100 mg at 02/16/22 0853   finasteride (PROSCAR) tablet 5 mg, 5 mg, Oral, Daily, Mansy, Jan A, MD, 5  mg at 02/16/22 0856   fluticasone furoate-vilanterol (BREO ELLIPTA) 100-25 MCG/ACT 1 puff, 1 puff, Inhalation, Daily, Fritzi Mandes, MD, 1 puff at 02/16/22 0857   [START ON 02/17/2022] furosemide (LASIX) tablet 40 mg, 40 mg, Oral, Daily, Fritzi Mandes, MD   guaiFENesin ([place]) 12 hr tablet 600 mg, 600 mg, Oral, BID, Mansy, Jan A, MD, 600 mg at 02/16/22 0853   insulin aspart (novoLOG) injection 0-5 Units, 0-5 Units, Subcutaneous, QHS, Fritzi Mandes, MD, 2 Units at 02/15/22 2200   insulin aspart (novoLOG) injection 0-9 Units, 0-9 Units, Subcutaneous, TID WC, Fritzi Mandes, MD, 2 Units at 02/16/22 1233   insulin aspart (novoLOG) injection 24 Units, 24 Units, Subcutaneous, Q breakfast, Fritzi Mandes, MD, 24 Units at 02/16/22 0824   insulin aspart (novoLOG) injection 30 Units, 30 Units, Subcutaneous, Q lunch, Fritzi Mandes, MD, 30 Units at 02/16/22 1234   insulin aspart (novoLOG) injection 30 Units, 30 Units, Subcutaneous, Q supper, Fritzi Mandes, MD, 30 Units at 02/15/22 1627   insulin glargine-yfgn (SEMGLEE) injection 65 Units, 65 Units, Subcutaneous, Daily, Fritzi Mandes, MD, 65 Units at 02/16/22 0824   ipratropium-albuterol (DUONEB) 0.5-2.5 (3) MG/3ML nebulizer solution 3 mL, 3 mL, Nebulization, TID, Fritzi Mandes, MD, 3 mL at 02/15/22 2033   lidocaine (LMX) 4 % cream, , Topical, TID PRN, Lysle Pearl, Isami, DO, Given at 02/15/22 2144   lisinopril (ZESTRIL) tablet 10 mg, 10 mg, Oral, QHS, Mansy, Jan A, MD, 10 mg at 02/15/22 2144   magnesium hydroxide (MILK OF MAGNESIA) suspension 30 mL, 30 mL, Oral, Daily PRN, Mansy, Jan A, MD   magnesium oxide (MAG-OX) tablet 400 mg, 400 mg, Oral, TID, Mansy, Jan A, MD, 400 mg at 02/16/22 1515   methylPREDNISolone sodium succinate (SOLU-MEDROL) 40 mg/mL injection 40 mg, 40 mg, Intravenous, Q12H, Fritzi Mandes, MD, 40 mg at 02/16/22 1524   ondansetron (ZOFRAN) tablet 4 mg, 4 mg, Oral, Q6H PRN **OR** ondansetron (ZOFRAN) injection 4 mg, 4 mg, Intravenous, Q6H PRN, Mansy, Jan A, MD   oxyCODONE  (Oxy IR/ROXICODONE) immediate release tablet 5 mg, 5 mg, Oral, Q8H PRN, Mansy, Jan A, MD, 5 mg at 02/16/22 0823   pantoprazole (PROTONIX) EC tablet 40 mg, 40 mg, Oral, BID AC, Mansy, Jan A, MD, 40 mg at 02/16/22 1524   polyethylene glycol (MIRALAX / GLYCOLAX) packet 17 g, 17 g, Oral, Daily, Fritzi Mandes, MD, 17 g at 02/16/22 0851   sildenafil (REVATIO) tablet 20 mg, 20 mg, Oral, TID, Mansy, Jan A, MD, 20 mg at 02/16/22 1526   traZODone (DESYREL) tablet 25 mg, 25 mg, Oral, QHS PRN, Mansy, Jan  A, MD, 25 mg at 02/13/22 2156   Treprostinil (TYVASO) inhalation solution 18 mcg, 18 mcg, Inhalation, QID, Mansy, Jan A, MD, 18 mcg at 02/16/22 1526    ALLERGIES   Shrimp [shellfish allergy] and Shrimp extract allergy skin test     REVIEW OF SYSTEMS    Review of Systems:  Gen:  Denies  fever, sweats, chills weigh loss  HEENT: Denies blurred vision, double vision, ear pain, eye pain, hearing loss, nose bleeds, sore throat Cardiac:  No dizziness, chest pain or heaviness, chest tightness,edema Resp:   Denies cough or sputum porduction, less shortness of breath,wheezing, hemoptysis,  Gi: Denies swallowing difficulty, stomach pain, nausea or vomiting, diarrhea, constipation, bowel incontinence Gu:  Denies bladder incontinence, burning urine Ext:   Denies Joint pain, stiffness or swelling Skin: Denies  skin rash, easy bruising or bleeding or hives Endoc:  Denies polyuria, polydipsia , polyphagia or weight change Psych:   Denies depression, insomnia or hallucinations   Other:  All other systems negative   VS: BP (!) 158/77 (BP Location: Left Arm)   Pulse 98   Temp 97.9 F (36.6 C)   Resp 19   Ht _0  (1.727 m)   Wt 82.6 kg   SpO2 95%   BMI 27.69 kg/m      PHYSICAL EXAM    GENERAL:NAD,  wife at bed side, on 5 liters [place] 01 HEAD: Normocephalic, atraumatic.  EYES: Pupils equal, round, reactive to light. Extraocular muscles intact. No scleral icterus.  MOUTH: Moist mucosal membrane.  Dentition intact. No abscess noted.  EAR, NOSE, THROAT: Clear without exudates. No external lesions.  NECK: Supple. No thyromegaly. No nodules. No JVD.  PULMONARY: Diffuse crackles CARDIOVASCULAR: S1 and S2. Regular rate and rhythm. No murmurs, rubs, or gallops. No edema. Pedal pulses 2+ bilaterally.  GASTROINTESTINAL: Soft, nontender, nondistended. No masses. Positive bowel sounds. No hepatosplenomegaly.  MUSCULOSKELETAL: No swelling, clubbing, or edema. Range of motion full in all extremities.  NEUROLOGIC: Cranial nerves II through XII are intact. No gross focal neurological deficits. Sensation intact. Reflexes intact.  SKIN: No ulceration, lesions, rashes, or cyanosis. Skin warm and dry. Turgor intact.  PSYCHIATRIC: Mood, affect within normal limits. The patient is awake, alert and oriented x 3. Insight, judgment intact.       IMAGING    DG Chest Port 1 View  Result Date: 02/12/2022 CLINICAL DATA:  Possible sepsis EXAM: PORTABLE CHEST 1 VIEW COMPARISON:  CT 10/03/2019, chest x-ray 08/03/2021, 06/23/2019, 01/04/2016 FINDINGS: Diffuse bilateral coarse reticular interstitial opacity consistent with fibrosis and chronic lung disease. Increased bilateral patchy opacities compared to prior suggesting acute superimposed infectious or inflammatory process. Stable cardiomediastinal silhouette with aortic atherosclerosis. No pneumothorax IMPRESSION: Extensive chronic lung disease and fibrosis. Increased bilateral patchy airspace opacities suspicious for superimposed acute infectious or inflammatory process Electronically Signed   By: Donavan Foil M.D.   On: 02/12/2022 22:46      ASSESSMENT/PLAN   This is a 76 yr old male,who present with known hx of copd, pulm htn, on treatment that has helped, and pulmonary fibrosis. His cxr suggest a superimpose patchy infiltrates. Suspect pneumonia ? Aspiration ve hcap that you are treating. However, part of the differential is an IPF flare ( his hospital  course does not support this dx)   -complete zpak and d/c on augmentin total of 10 days -on d/c will resume his usual meds -will wean fio2 further post d/c -suspect he will be able to go home tomorrow.  Thank you for allowing me to participate in the care of this patient.   Patient/Family are satisfied with care plan and all questions have been answered.  This document was prepared using Dragon voice recognition software and may include unintentional dictation errors.     Wallene Huh, M.D.  Division of [place]

## 2022-02-16 NOTE — Care Management Important Message (Signed)
Important Message  Patient Details  Name: Jay Morris MRN: [id number] Date of Birth: 1945-09-05   Medicare Important Message Given:  Yes     Dannette Barbara 02/16/2022, 11:53 AM

## 2022-02-16 NOTE — Progress Notes (Signed)
[facility] [place] NAME: Jay Morris    MR#:  [id number]  DATE OF BIRTH:  03-07-1946  SUBJECTIVE:   came in with increasing shortness of breath cough fever 103 after he went home from hemorrhoidectomy surgery that was scheduled earlier in the day. Patient feels short winded and becomes very hypoxic on exertion. Uses chronic oxygen at home  Wife at bedside. Pt on [place] 5 L and breathing comfortably good urine output ambulating around in the room   VITALS:  Blood pressure (!) 160/59, pulse 86, temperature (!) 97.5 F (36.4 C), resp. rate 17, height _0  (1.727 m), weight 82.6 kg, SpO2 95 %.  PHYSICAL EXAMINATION:   GENERAL:  76 y.o.-year-old patient lying in the bed with no acute distress.  LUNGS: distant breath sounds bilaterally, fine rales,no rhonchi.  CARDIOVASCULAR: S1, S2 normal. No murmurs, rubs, or gallops. Tachycardia ABDOMEN: Soft, nontender, nondistended. Bowel sounds present.  EXTREMITIES: No  edema b/l.    NEUROLOGIC: nonfocal  patient is alert and awake SKIN: No obvious rash, lesion, or ulcer.   LABORATORY PANEL:  CBC Recent Labs  Lab 02/15/22 0608  WBC 15.1*  HGB 8.1*  HCT 25.4*  PLT 224     Chemistries  Recent Labs  Lab 02/12/22 2209 02/13/22 0639 02/15/22 0608  NA 140   < > 138  K 3.9   < > 4.6  CL 105   < > 104  CO2 25   < > 26  GLUCOSE 103*   < > 229*  BUN 26*   < > 37*  CREATININE 2.19*   < > 2.25*  CALCIUM 8.6*   < > 8.9  AST 23  --   --   ALT 21  --   --   ALKPHOS 34*  --   --   BILITOT 1.1  --   --    < > = values in this interval not displayed.    Cardiac Enzymes No results for input(s): "TROPONINI" in the last 168 hours. RADIOLOGY:  No results found.  Assessment and Plan Jay Morris is a 76 y.o. Caucasian male with medical history significant for BPH, stage IV CKD, COPD, coronary artery disease, DM 2, GERD, hypertension and dyslipidemia, who underwent hemorrhoidectomy earlier  yesterday.  During the evening he experienced dry cough with associated dyspnea and wheezing.  He was noted to have fever and chills.  He admitted to nausea with dry heaves without abdominal pain  Portable chest x-ray showed extensive chronic lung disease and fibrosis with increased bilateral patchy airspace opacities suspicious for superimposed acute infectious or inflammatory process.  Severe sepsis secondary to pneumonia suspect aspiration with h/o recent surgery performed 02/11/22 -- patient came in with increasing shortness of breath, tachycardia, fever 103.2,, tachypnea, elevated white count of 27, lactic acid 2.7-->2.6 -- chest x-ray shows bilateral patches airspace opacity superimposed infection -- patient received broad-spectrum antibiotic per sepsis protocol -- continue IV unasyn and azithromycin -- blood culture  and urine culture is negative --procalcitonin 5.50  Acute on chronic hypoxic respiratory failure secondary to chronic interstitial lung disease with chronic pulmonary hypertension and chronic home oxygen -- patient follows with Dr. Raul Del -- continue IV Solu-Medrol, inhalers, bronchodilator nebs-- change to PO steroids from tomorrow if remains stable --consult dr Raul Del -- continue home meds and nebulizer for pulmonary hypertension --pt currently on 5 L [place] (normally uses at home) -- Lasix 40 mg IV times one today  Pulmonary  hypertension, moderate to severe (HCC) -  continue Revatio and Tyvaso.   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 -- Semglee and Novolog tid with meals  Recent Hemorrhoidectomy surgery --cont bowel regimen per Dr Lysle Pearl --avoid constipation  Hypertension -- continue Lisinopril and lasix  CKD stage IV --Baseline creatinine around 2.3. Stable  paroxysmal atrial fibrillation -- continue eliquis  Procedures:none Family communication :wife Consults :Pulmanry--dr fleming CODE STATUS: FULL--confirmed with pt DVT  Prophylaxis :eliquis Level of care: Progressive Status is: Inpatient Remains inpatient appropriate because: admitted with severe sepsis due to aspiration pneumonia and acute on chronic hypoxic respiratory failure.   Is improving slowly. Giving Lasix IV doses today. If continues to show improvement likely discharge in 1 to 2 days to finish PO steroid taper and antibiotic.    TOTAL TIME TAKING CARE OF THIS PATIENT: 35 minutes.  >50% time spent on counselling and coordination of care  Note: This dictation was prepared with Dragon dictation along with smaller phrase technology. Any transcriptional errors that result from this process are unintentional.  Fritzi Mandes M.D    [facility]   CC: Primary care physician; [name], MD

## 2022-02-17 DIAGNOSIS — J189 Pneumonia, unspecified organism: Secondary | ICD-10-CM | POA: Diagnosis not present

## 2022-02-17 DIAGNOSIS — A419 Sepsis, unspecified organism: Secondary | ICD-10-CM | POA: Diagnosis not present

## 2022-02-17 LAB — BASIC METABOLIC PANEL
Anion gap: 12 (ref 5–15)
BUN: 50 mg/dL — ABNORMAL HIGH (ref 8–23)
CO2: 28 mmol/L (ref 22–32)
Calcium: 9.2 mg/dL (ref 8.9–10.3)
Chloride: 97 mmol/L — ABNORMAL LOW (ref 98–111)
Creatinine, Ser: 2.24 mg/dL — ABNORMAL HIGH (ref 0.61–1.24)
GFR, Estimated: 29 mL/min — ABNORMAL LOW (ref >60)
Glucose, Bld: 199 mg/dL — ABNORMAL HIGH (ref 70–99)
Potassium: 4.2 mmol/L (ref 3.5–5.1)
Sodium: 137 mmol/L (ref 135–145)

## 2022-02-17 LAB — CULTURE, BLOOD (ROUTINE X 2)
Culture: NO GROWTH
Culture: NO GROWTH
Special Requests: ADEQUATE
Special Requests: ADEQUATE

## 2022-02-17 LAB — GLUCOSE, CAPILLARY
Glucose-Capillary: 215 mg/dL — ABNORMAL HIGH (ref 70–99)
Glucose-Capillary: 194 mg/dL — ABNORMAL HIGH (ref 70–99)
Glucose-Capillary: 218 mg/dL — ABNORMAL HIGH (ref 70–99)
Glucose-Capillary: 306 mg/dL — ABNORMAL HIGH (ref 70–99)
Glucose-Capillary: 153 mg/dL — ABNORMAL HIGH (ref 70–99)
Glucose-Capillary: 206 mg/dL — ABNORMAL HIGH (ref 70–99)

## 2022-02-17 MED ORDER — IPRATROPIUM-ALBUTEROL 0.5-2.5 (3) MG/3ML IN SOLN
3.0000 mL | Freq: Every day | RESPIRATORY_TRACT | Status: DC
  Administered 2022-02-18 – 2022-02-19 (×2): 3 mL via RESPIRATORY_TRACT
  Filled 2022-02-17 (×2): qty 3

## 2022-02-17 MED ORDER — INSULIN GLARGINE-YFGN 100 UNIT/ML ~~LOC~~ SOLN
70.0000 [IU] | Freq: Every day | SUBCUTANEOUS | Status: DC
Start: 2022-02-18 — End: 2022-02-19
  Administered 2022-02-18 – 2022-02-19 (×2): 70 [IU] via SUBCUTANEOUS
  Filled 2022-02-17 (×2): qty 0.7

## 2022-02-17 NOTE — Progress Notes (Signed)
Mobility Specialist - Progress Note    02/17/22 1200  Mobility  Activity Ambulated with assistance in hallway;Ambulated with assistance to bathroom;Stood at bedside;Dangled on edge of bed  Level of Assistance Standby assist, set-up cues, supervision of patient - no hands on  Assistive Device Front wheel walker  Distance Ambulated (ft) 100 ft  Activity Response Tolerated well  $Mobility charge 1 Mobility    Pt EOB on 5L upon arrival. Pt STS with MinA and ambulates down hall SBA. Pt uses restroom upon return and blood noted  while using restroom. RN notified. Pt returns to EOB with needs in reach and bed alarm on.   Gretchen Short  Mobility Specialist  02/17/22 12:06 PM

## 2022-02-17 NOTE — Inpatient Diabetes Management (Signed)
Inpatient Diabetes Program Recommendations  AACE/ADA: New Consensus Statement on Inpatient Glycemic Control   Target Ranges:  Prepandial:   less than 140 mg/dL      Peak postprandial:   less than 180 mg/dL (1-2 hours)      Critically ill patients:  140 - 180 mg/dL    Latest Reference Range & Units 02/16/22 07:18 02/16/22 11:29 02/16/22 15:51 02/16/22 20:54 02/17/22 07:46  Glucose-Capillary 70 - 99 mg/dL 245 (H) 170 (H) 215 (H) 194 (H) 218 (H)   Review of Glycemic Control  Current orders for Inpatient glycemic control: Semglee 65 units daily, Novolog 24 units with breakfast, 30 units with lunch and supper, Novolog 0-9 units TID with meals, Novolog 0-5 units QHS; Solumedrol 40 mg Q12H  Inpatient Diabetes Program Recommendations:    Insulin: If steroids are continued as ordered, please consider increasing Semglee to 70 units daily.  Thanks, Barnie Alderman, RN, MSN, [place] Diabetes Coordinator Inpatient Diabetes Program [phone] (Team Pager from 8am to [place])

## 2022-02-17 NOTE — Progress Notes (Signed)
Mobility Specialist - Progress Note    02/17/22 1325  Mobility  Activity Stood at bedside;Ambulated independently to bathroom;Dangled on edge of bed  Level of Assistance Independent  Distance Ambulated (ft) 10 ft  Activity Response Tolerated well  $Mobility charge 1 Mobility    Pt EOB on 5L upon arrival. Pt STS and ambulates to restroom indep. Pt returns to EOB with needs in reach and MD in room.  Gretchen Short  Mobility Specialist  02/17/22 1:26 PM

## 2022-02-17 NOTE — Final Progress Note (Signed)
Pulmonary Medicine          Date: 02/17/2022,   MRN# [id number] Ruddy Kithcart 03/17/1946      HISTORY OF PRESENT ILLNESS   So far a great day, ambulating, no worsening dyspnea. Diuresed, fio2 5 liters. Basal line 2-3 liters. As discussed will continue to wean fio2 as indicated post d/c. No fever or chest pain. He is to resume his pre admit pulmonary meds.   PAST MEDICAL HISTORY   Past Medical History:  Diagnosis Date   (HFpEF) heart failure with preserved ejection fraction ([place])    a.) TTE 04/01/2016: EF 60-65%, LAE, MAC, mild MR/AR/TR, mild-mod PR, G2DD; b.) TTE 12/18/2018: EF 55%, mild LVH, mild AR/MR/TR. mod PR; c.) TTE 05/31/2019: EF 60-65%, mild LVH, RAE/RVE, triv AR.MT, PR, mild TR; d.) TTE 10/26/2019: EF 55%, BAE, RVE, triv AR, mild MR/PR, sev TR, G2DD; e.) R/LHC 01/17/2020: mPA 52, PCWP 25, LVEDP 19, RVEDP 25, mRA 13   Anemia    Aortic atherosclerosis (HCC)    Asthma    BPH (benign prostatic hyperplasia)    CKD (chronic kidney disease), stage III (HCC)    COPD (chronic obstructive pulmonary disease) ([place])    Coronary artery disease 01/17/2020   a.) LHC 01/17/2020: 25% oLAD, 65% o-pRCA, 45% pLAD, 65% mLAD, 35% OM2 --> med mgmt.   DDD (degenerative disc disease), lumbar    GERD (gastroesophageal reflux disease)    GI bleed 36/1443   Gout    Helicobacter pylori gastritis    Hemorrhoids    History of 2019 novel coronavirus disease (COVID-19) 05/30/2019   History of asbestos exposure    Hyperlipidemia    Hypertension    Long term current use of anticoagulant    a.) apixaban   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/03/2019   PAF (paroxysmal atrial fibrillation) (HCC)    a.) CHA2DS2-VASc = 6 (age x 2, HFpEF, HTN, vascular disease, T2DM);  b.) cardiac ablation in approx 2014-2015 at [facility]; c.) A.fib recurrence in setting of SARS-CoV-2 --> Tx'd with IV amiodarone + metoprolol + DCCV x 1 (200 J) 06/22/2019 restoring NSR; d.) rate/rhythm maintained  without pharmacological intervention; chronically anticoagulated using standard dose apixaban   Pneumonia due to COVID-19 virus 06/02/2019   a.) hospitalized at [facility]) from 06/02/2019 - 06/25/2019; Tx'd with steriods + remdesivir + convalescent plasma   Pulmonary fibrosis (HCC)    Pulmonary HTN ([place]) 04/01/2016   a.) TTE 04/01/2016: EF 60-65%, RVSP 40; b.) TTE 12/18/2018: EF 55%, RVSP 45.9; c.) TTE 05/31/2019: EF 60-65%, RVSP 35.5; d.) TTE 10/26/2019: EF 55%, RVSP 100.4; e.) R/LHC 01/17/2020: mPA 52, PCWP 25, LVEDP 19, RVEDP 25, mRA 13; f.) Tx with PDE5i (sildenafil) + vasodilator (treprostinil)   Type 2 diabetes mellitus treated with insulin ([employer])      SURGICAL HISTORY   Past Surgical History:  Procedure Laterality Date   BACK SURGERY     x3 L3-L4    CARDIAC ELECTROPHYSIOLOGY MAPPING AND ABLATION N/A    performed at [facility] in approximately 2014-2015   CARDIOVERSION N/A 06/22/2019   Procedure: CARDIOVERSION;  Surgeon: Fay Records, MD;  Location: [facility];  Service: Cardiovascular;  Laterality: N/A;   COLONOSCOPY WITH PROPOFOL N/A 06/15/2017   Procedure: COLONOSCOPY WITH PROPOFOL;  Surgeon: Toledo, Benay Pike, MD;  Location: [facility] ENDOSCOPY;  Service: Gastroenterology;  Laterality: N/A;   ENTEROSCOPY N/A 11/24/2017   Procedure: ENTEROSCOPY;  Surgeon: Jonathon Bellows, MD;  Location: [facility] ENDOSCOPY;  Service: Gastroenterology;  Laterality:  N/A;   ESOPHAGOGASTRODUODENOSCOPY N/A 11/27/2017   Procedure: ESOPHAGOGASTRODUODENOSCOPY (EGD);  Surgeon: Lucilla Lame, MD;  Location: [facility] ENDOSCOPY;  Service: Endoscopy;  Laterality: N/A;   ESOPHAGOGASTRODUODENOSCOPY (EGD) WITH PROPOFOL N/A 06/02/2017   Procedure: ESOPHAGOGASTRODUODENOSCOPY (EGD) WITH PROPOFOL;  Surgeon: Toledo, Benay Pike, MD;  Location: [facility] ENDOSCOPY;  Service: Gastroenterology;  Laterality: N/A;   ESOPHAGOGASTRODUODENOSCOPY (EGD) WITH PROPOFOL N/A 08/04/2021   Procedure: ESOPHAGOGASTRODUODENOSCOPY (EGD) WITH  PROPOFOL;  Surgeon: Lesly Rubenstein, MD;  Location: [facility] ENDOSCOPY;  Service: Endoscopy;  Laterality: N/A;   EVALUATION UNDER ANESTHESIA WITH HEMORRHOIDECTOMY N/A 02/12/2022   Procedure: EXAM UNDER ANESTHESIA WITH HEMORRHOIDECTOMY;  Surgeon: Benjamine Sprague, DO;  Location: [facility] ORS;  Service: General;  Laterality: N/A;   GIVENS CAPSULE STUDY N/A 11/25/2017   Procedure: GIVENS CAPSULE STUDY;  Surgeon: Jonathon Bellows, MD;  Location: [facility] ENDOSCOPY;  Service: Gastroenterology;  Laterality: N/A;   HAND SURGERY     RIGHT/LEFT HEART CATH AND CORONARY ANGIOGRAPHY N/A 01/17/2020   Procedure: RIGHT/LEFT HEART CATH AND CORONARY ANGIOGRAPHY;  Surgeon: Corey Skains, MD;  Location: [facility] CV LAB;  Service: Cardiovascular;  Laterality: N/A;     FAMILY HISTORY   Family History  Problem Relation Age of Onset   Heart disease Mother    Cancer Father    Cancer Paternal Grandfather      SOCIAL HISTORY   Social History   Tobacco Use   Smoking status: Former    Packs/day: 1.00    Years: 30.00    Total pack years: 30.00    Types: Cigarettes    Quit date: 1995    Years since quitting: 28.6   Smokeless tobacco: Never  Vaping Use   Vaping Use: Never used  Substance Use Topics   Alcohol use: No    Alcohol/week: 0.0 standard drinks of alcohol    Comment: occasional   Drug use: No     MEDICATIONS    Home Medication:    Current Medication:  Current Facility-Administered Medications:    acetaminophen (TYLENOL) tablet 650 mg, 650 mg, Oral, Q6H PRN, 650 mg at 02/13/22 2156 **OR** acetaminophen (TYLENOL) suppository 650 mg, 650 mg, Rectal, Q6H PRN, Mansy, Jan A, MD   albuterol (PROVENTIL) (2.5 MG/3ML) 0.083% nebulizer solution 3 mL, 3 mL, Inhalation, Q6H PRN, Mansy, Jan A, MD, 3 mL at 02/16/22 1232   allopurinol (ZYLOPRIM) tablet 200 mg, 200 mg, Oral, Daily, Mansy, Jan A, MD, 200 mg at 02/17/22 0905   Ampicillin-Sulbactam (UNASYN) 3 g in sodium chloride 0.9 % 100 mL IVPB, 3 g,  Intravenous, Q12H, Fritzi Mandes, MD, Last Rate: 200 mL/hr at 02/17/22 1014, 3 g at 02/17/22 1014   apixaban (ELIQUIS) tablet 5 mg, 5 mg, Oral, BID, Mansy, Jan A, MD, 5 mg at 02/17/22 7062   ascorbic acid (VITAMIN C) tablet 500 mg, 500 mg, Oral, Daily, Mansy, Jan A, MD, 500 mg at 02/17/22 3762   atorvastatin (LIPITOR) tablet 20 mg, 20 mg, Oral, QHS, Mansy, Jan A, MD, 20 mg at 02/16/22 2239   azithromycin (ZITHROMAX) tablet 500 mg, 500 mg, Oral, Daily, Posey Pronto, Sona, MD, 500 mg at 02/16/22 2239   chlorpheniramine-HYDROcodone 10-8 MG/5ML suspension 5 mL, 5 mL, Oral, Q12H PRN, Mansy, Jan A, MD   cholecalciferol (VITAMIN D3) 25 MCG (1000 UNIT) tablet 5,000 Units, 5,000 Units, Oral, Daily, Mansy, Jan A, MD, 5,000 Units at 02/17/22 8315   cyanocobalamin (VITAMIN B12) tablet 1,000 mcg, 1,000 mcg, Oral, Daily, Mansy, Jan A, MD, 1,000 mcg at 02/17/22 0905   docusate sodium (  COLACE) capsule 100 mg, 100 mg, Oral, BID, Sakai, Isami, DO, 100 mg at 02/17/22 0906   finasteride (PROSCAR) tablet 5 mg, 5 mg, Oral, Daily, Mansy, Jan A, MD, 5 mg at 02/17/22 0905   fluticasone furoate-vilanterol (BREO ELLIPTA) 100-25 MCG/ACT 1 puff, 1 puff, Inhalation, Daily, Fritzi Mandes, MD, 1 puff at 02/17/22 0911   furosemide (LASIX) tablet 40 mg, 40 mg, Oral, Daily, Fritzi Mandes, MD, 40 mg at 02/17/22 0906   guaiFENesin (MUCINEX) 12 hr tablet 600 mg, 600 mg, Oral, BID, Mansy, Jan A, MD, 600 mg at 02/17/22 3559   insulin aspart (novoLOG) injection 0-5 Units, 0-5 Units, Subcutaneous, QHS, Fritzi Mandes, MD, 2 Units at 02/15/22 2200   insulin aspart (novoLOG) injection 0-9 Units, 0-9 Units, Subcutaneous, TID WC, Fritzi Mandes, MD, 7 Units at 02/17/22 1241   insulin aspart (novoLOG) injection 24 Units, 24 Units, Subcutaneous, Q breakfast, Fritzi Mandes, MD, 24 Units at 02/17/22 0901   insulin aspart (novoLOG) injection 30 Units, 30 Units, Subcutaneous, Q lunch, Fritzi Mandes, MD, 30 Units at 02/17/22 1241   insulin aspart (novoLOG) injection 30  Units, 30 Units, Subcutaneous, Q supper, Fritzi Mandes, MD, 30 Units at 02/16/22 1714   [START ON 02/18/2022] insulin glargine-yfgn (SEMGLEE) injection 70 Units, 70 Units, Subcutaneous, Daily, Shawna Clamp, MD   [START ON 02/18/2022] ipratropium-albuterol (DUONEB) 0.5-2.5 (3) MG/3ML nebulizer solution 3 mL, 3 mL, Nebulization, Daily, Shawna Clamp, MD   lidocaine (LMX) 4 % cream, , Topical, TID PRN, Lysle Pearl, Isami, DO, Given at 02/15/22 2144   lisinopril (ZESTRIL) tablet 10 mg, 10 mg, Oral, QHS, Mansy, Jan A, MD, 10 mg at 02/16/22 2239   magnesium hydroxide (MILK OF MAGNESIA) suspension 30 mL, 30 mL, Oral, Daily PRN, Mansy, Jan A, MD   magnesium oxide (MAG-OX) tablet 400 mg, 400 mg, Oral, TID, Mansy, Jan A, MD, 400 mg at 02/17/22 7416   methylPREDNISolone sodium succinate (SOLU-MEDROL) 40 mg/mL injection 40 mg, 40 mg, Intravenous, Q12H, Fritzi Mandes, MD, 40 mg at 02/17/22 0407   ondansetron (ZOFRAN) tablet 4 mg, 4 mg, Oral, Q6H PRN **OR** ondansetron (ZOFRAN) injection 4 mg, 4 mg, Intravenous, Q6H PRN, Mansy, Jan A, MD   oxyCODONE (Oxy IR/ROXICODONE) immediate release tablet 5 mg, 5 mg, Oral, Q8H PRN, Mansy, Jan A, MD, 5 mg at 02/16/22 3845   pantoprazole (PROTONIX) EC tablet 40 mg, 40 mg, Oral, BID AC, Mansy, Jan A, MD, 40 mg at 02/17/22 3646   polyethylene glycol (MIRALAX / GLYCOLAX) packet 17 g, 17 g, Oral, Daily, Fritzi Mandes, MD, 17 g at 02/17/22 8032   sildenafil (REVATIO) tablet 20 mg, 20 mg, Oral, TID, Mansy, Jan A, MD, 20 mg at 02/17/22 1224   traZODone (DESYREL) tablet 25 mg, 25 mg, Oral, QHS PRN, Mansy, Jan A, MD, 25 mg at 02/13/22 2156   Treprostinil (TYVASO) inhalation solution 18 mcg, 18 mcg, Inhalation, QID, Mansy, Jan A, MD, 18 mcg at 02/17/22 1148    ALLERGIES   Shrimp [shellfish allergy] and Shrimp extract allergy skin test     REVIEW OF SYSTEMS    Review of Systems:  Gen:  Denies  fever, sweats, chills weigh loss  HEENT: Denies blurred vision, double vision, ear pain, eye  pain, hearing loss, nose bleeds, sore throat Cardiac:  No dizziness, chest pain or heaviness, chest tightness,edema Resp:   Denies cough or sputum porduction, less shortness of breath,wheezing, hemoptysis,  Gi: Denies swallowing difficulty, stomach pain, nausea or vomiting, diarrhea, constipation, bowel incontinence Gu:  Denies bladder incontinence, burning  urine Ext:   Denies Joint pain, stiffness or swelling Skin: Denies  skin rash, easy bruising or bleeding or hives Endoc:  Denies polyuria, polydipsia , polyphagia or weight change Psych:   Denies depression, insomnia or hallucinations   Other:  All other systems negative   VS: BP (!) 150/67 (BP Location: Left Arm)   Pulse 95   Temp 97.9 F (36.6 C) (Oral)   Resp 18   Ht _0  (1.727 m)   Wt 82.6 kg   SpO2 95%   BMI 27.69 kg/m      PHYSICAL EXAM    GENERAL:NAD,  HEAD: Normocephalic, atraumatic.  EYES: Pupils equal, round, reactive to light. Extraocular muscles intact. No scleral icterus.  MOUTH: Moist mucosal membrane. Dentition intact. No abscess noted.  EAR, NOSE, THROAT: Clear without exudates. No external lesions.  NECK: Supple. No thyromegaly. No nodules. No JVD.  PULMONARY: moving air well, no accessory muscle usage CARDIOVASCULAR: S1 and S2. Regular rate and rhythm. No murmurs, rubs, or gallops. No edema. Pedal pulses 2+ bilaterally.  GASTROINTESTINAL: Soft, nontender, nondistended. No masses. Positive bowel sounds. No hepatosplenomegaly.  MUSCULOSKELETAL: No swelling, clubbing, or edema. Range of motion full in all extremities.  NEUROLOGIC: Cranial nerves II through XII are intact. No gross focal neurological deficits. Sensation intact. Reflexes intact.  SKIN: No ulceration, lesions, rashes, or cyanosis. Skin warm and dry. Turgor intact.  PSYCHIATRIC: Mood, affect within normal limits. The patient is awake, alert and oriented x 3. Insight, judgment intact.       IMAGING    DG Chest Port 1 View  Result  Date: 02/12/2022 CLINICAL DATA:  Possible sepsis EXAM: PORTABLE CHEST 1 VIEW COMPARISON:  CT 10/03/2019, chest x-ray 08/03/2021, 06/23/2019, 01/04/2016 FINDINGS: Diffuse bilateral coarse reticular interstitial opacity consistent with fibrosis and chronic lung disease. Increased bilateral patchy opacities compared to prior suggesting acute superimposed infectious or inflammatory process. Stable cardiomediastinal silhouette with aortic atherosclerosis. No pneumothorax IMPRESSION: Extensive chronic lung disease and fibrosis. Increased bilateral patchy airspace opacities suspicious for superimposed acute infectious or inflammatory process Electronically Signed   By: Donavan Foil M.D.   On: 02/12/2022 22:46      ASSESSMENT/PLAN   This is a 76 yr old male,who present with known hx of copd, pulm htn, on treatment that has helped, and pulmonary fibrosis. His cxr suggest a superimpose patchy infiltrates. Suspect pneumonia ? Aspiration ve hcap that you are treating. However, part of the differential is an IPF flare ( his hospital course does not support this dx) today ambulating arounf nursing station without difficulty  -complete zpak and d/c on augmentin total of 10 days -on d/c will resume his usual meds -will wean fio2 further post d/c -Ok to go home today  -will see in office next Tuesday   S/p recent hemorrhoid surgery. Had b/p a little blood Following surgery recs       Thank you for allowing me to participate in the care of this patient.   Patient/Family are satisfied with care plan and all questions have been answered.  This document was prepared using Dragon voice recognition software and may include unintentional dictation errors.     Wallene Huh, M.D.  Division of [facility]

## 2022-02-17 NOTE — Progress Notes (Signed)
Mobility Specialist - Progress Note    02/17/22 1420  Mobility  Activity Stood at bedside;Ambulated with assistance in hallway;Dangled on edge of bed  Level of Assistance Independent  Distance Ambulated (ft) 160 ft  Activity Response Tolerated well  $Mobility charge 1 Mobility    Pt EOB on 5L upon arrival. PT STS and ambulates 1 lap around NS indep. PT returns to bed with needs in reach.   Gretchen Short  Mobility Specialist  02/17/22 2:21 PM

## 2022-02-17 NOTE — Progress Notes (Addendum)
PROGRESS NOTE    Jay Morris  IEP:[id number] DOB: [birth year] DOA: 02/12/2022  PCP: [name], MD   Brief Narrative:  This 76 years old male with PMH significant for BPH, stage IV CKD, COPD, coronary artery disease, diabetes melitis type II, hypertension, GERD, dyslipidemia who underwent hemorrhoidectomy and was discharged home.  During the evening he has experienced dry cough with associated shortness of breath and wheezing.  He was also noted to have fever and chills.  Chest x-ray showed extensive chronic lung disease with fibrosis and increased bilateral patchy airspace opacities suspicious for superimposed acute infectious or inflammatory process. Patient is admitted for severe sepsis secondary to aspiration pneumonia.  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)   Type II diabetes mellitus with renal manifestations (HCC)   HTN (hypertension)   BPH (benign prostatic hyperplasia)   Chronic obstructive pulmonary disease (COPD) ([place])   GERD without esophagitis   Severe sepsis secondary to pneumonia: Suspect aspiration with history of recent surgery performed 02/11/2022 Patient presented with increasing shortness of breath, tachycardia, fever 103.2, tachypnea, elevated WBC, lactic acidosis chest x-ray shows bilateral patches airspace opacity superimposed infection Patient has received broad-spectrum antibiotic per sepsis protocol Continue IV Unasyn and  azithromycin. Blood cultures and urine cultures are negative. Procalcitonin 5.50, sepsis physiology has resolved. Consider discharge on p.o. antibiotics in a day or 2.  Acute on chronic hypoxic respiratory failure: Likely secondary to chronic interstitial lung disease with chronic pulmonary hypertension and chronic home oxygen. Patient regularly follows up with  Dr. Raul Del. Continue IV Solu-Medrol, inhalers, bronchodilator nebs--  change to PO steroids from tomorrow if remains stable. 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   Diabetes type 2 with CKD stage IV Continue sliding scale insulin. Semglee and Novolog tid with meals. Increase Semglee to 70 units since patient is on steroids.   Recent Hemorrhoidectomy surgery Continue bowel regimen per Dr Lysle Pearl Avoid constipation   Essential Hypertension: Continue lisinopril and Lasix.   CKD stage IV: Stable.  Baseline creatinine around 2.3.    Paroxysmal atrial fibrillation: Continue Eliquis.    DVT prophylaxis: Eliquis Code Status: Full code Family Communication: Wife at bedside Disposition Plan:   Status is: Inpatient Remains inpatient appropriate because:  Admitted for severe sepsis due to aspiration pneumonia and acute on chronic hypoxic respiratory failure.   Anticipated discharge home 02/18/2022.  Consultants:  None  Procedures: None Antimicrobials:   Anti-infectives (From admission, onward)    Start     Dose/Rate Route Frequency Ordered Stop   02/16/22 2200  azithromycin (ZITHROMAX) tablet 500 mg        500 mg Oral Daily 02/16/22 1243 02/18/22 2159   02/13/22 2300  cefTRIAXone (ROCEPHIN) 2 g in sodium chloride 0.9 % 100 mL IVPB  Status:  Discontinued        2 g 200 mL/hr over 30 Minutes Intravenous Every 24 hours 02/13/22 0114 02/13/22 0547   02/13/22 1000  Ampicillin-Sulbactam (UNASYN) 3 g in sodium chloride 0.9 % 100 mL IVPB        3 g 200 mL/hr over 30 Minutes Intravenous Every 12 hours 02/13/22 0547 02/18/22 2359   02/13/22 0115  azithromycin (  ZITHROMAX) 500 mg in sodium chloride 0.9 % 250 mL IVPB  Status:  Discontinued        500 mg 250 mL/hr over 60 Minutes Intravenous Every 24  hours 02/13/22 0114 02/16/22 1243   02/12/22 2300  vancomycin (VANCOCIN) IVPB 1000 mg/200 mL premix        1,000 mg 200 mL/hr over 60 Minutes Intravenous  Once 02/12/22 2247 02/13/22 0006   02/12/22 2300  ceFEPIme (MAXIPIME) 2 g in sodium chloride 0.9 % 100 mL IVPB        2 g 200 mL/hr over 30 Minutes Intravenous  Once 02/12/22 2247 02/12/22 2323   02/12/22 2300  metroNIDAZOLE (FLAGYL) IVPB 500 mg        500 mg 100 mL/hr over 60 Minutes Intravenous  Once 02/12/22 2247 02/13/22 0012        Subjective: Patient was seen and examined at bedside.  Overnight events noted.   Patient reports feeling better,  still remains on 5 L of supplemental oxygen, reports feeling better,  ambulating around the room.  Objective: Vitals:   02/17/22 0403 02/17/22 0744 02/17/22 0755 02/17/22 1227  BP: 131/70 (!) 152/87  (!) 150/67  Pulse: 69 67 97 95  Resp: _0 Temp: 98 F (36.7 C) 97.9 F (36.6 C)  97.9 F (36.6 C)  TempSrc: Oral Oral  Oral  SpO2: 96% 96% 96% 95%  Weight:      Height:        Intake/Output Summary (Last 24 hours) at 02/17/2022 1312 Last data filed at 02/17/2022 1234 Gross per 24 hour  Intake 956 ml  Output 3275 ml  Net -2319 ml   Filed Weights   02/12/22 2204  Weight: 82.6 kg    Examination:  General exam: Appears comfortable, not in any acute distress.  Deconditioned Respiratory system: Clear to auscultation bilaterally, no wheezing, no crackles. Cardiovascular system: S1 & S2 heard, regular rate and rhythm, no murmur. Gastrointestinal system: Abdomen is soft, nontender, nondistended, BS+. Central nervous system: Alert and oriented X 3. No focal neurological deficits. Extremities: No edema, no cyanosis, no clubbing. Skin: No rashes, lesions or ulcers Psychiatry: Judgement and insight appear normal. Mood & affect appropriate.     Data Reviewed: I have personally reviewed following labs and imaging studies  CBC: Recent Labs  Lab 02/12/22 2209  02/13/22 0639 02/15/22 0608  WBC 19.4* 27.4* 15.1*  NEUTROABS 17.5*  --   --   HGB 9.6* 8.5* 8.1*  HCT 30.7* 26.8* 25.4*  MCV 100.0 100.0 97.3  PLT 229 218 827   Basic Metabolic Panel: Recent Labs  Lab 02/12/22 2209 02/13/22 0639 02/15/22 0608 02/17/22 0435  NA 140 138 138 137  K 3.9 4.7 4.6 4.2  CL 105 106 104 97*  CO2 _1 GLUCOSE 103* 140* 229* 199*  BUN 26* 26* 37* 50*  CREATININE 2.19* 2.33* 2.25* 2.24*  CALCIUM 8.6* 8.1* 8.9 9.2   GFR: Estimated Creatinine Clearance: 28.5 mL/min (A) (by C-G formula based on SCr of 2.24 mg/dL (H)). Liver Function Tests: Recent Labs  Lab 02/12/22 2209  AST 23  ALT 21  ALKPHOS 34*  BILITOT 1.1  PROT 6.8  ALBUMIN 3.9   No results for input(s): "LIPASE", "AMYLASE" in the last 168 hours. No results for input(s): "AMMONIA" in the last 168 hours. Coagulation Profile: Recent Labs  Lab 02/12/22 2209 02/13/22 0639  INR 1.2 1.2   Cardiac Enzymes: No results for input(s): "CKTOTAL", "CKMB", "  CKMBINDEX", "TROPONINI" in the last 168 hours. BNP (last 3 results) No results for input(s): "PROBNP" in the last 8760 hours. HbA1C: No results for input(s): "HGBA1C" in the last 72 hours. CBG: Recent Labs  Lab 02/16/22 0718 02/16/22 1129 02/16/22 1551 02/16/22 2054 02/17/22 0746  GLUCAP 245* 170* 215* 194* 218*   Lipid Profile: No results for input(s): "CHOL", "HDL", "LDLCALC", "TRIG", "CHOLHDL", "LDLDIRECT" in the last 72 hours. Thyroid Function Tests: No results for input(s): "TSH", "T4TOTAL", "FREET4", "T3FREE", "THYROIDAB" in the last 72 hours. Anemia Panel: No results for input(s): "VITAMINB12", "FOLATE", "FERRITIN", "TIBC", "IRON", "RETICCTPCT" in the last 72 hours. Sepsis Labs: Recent Labs  Lab 02/12/22 2209 02/13/22 0102 02/13/22 0639  PROCALCITON  --   --  5.50  LATICACIDVEN 2.7* 2.6*  --     Recent Results (from the past 240 hour(s))  Blood Culture (routine x 2)     Status: None   Collection Time:  02/12/22 10:09 PM   Specimen: BLOOD  Result Value Ref Range Status   Specimen Description BLOOD LEFT ARM  Final   Special Requests   Final    BOTTLES DRAWN AEROBIC AND ANAEROBIC Blood Culture adequate volume   Culture   Final    NO GROWTH 5 DAYS Performed at [facility], [address], [place], [place] [address]    Report Status 02/17/2022 FINAL  Final  Blood Culture (routine x 2)     Status: None   Collection Time: 02/12/22 10:09 PM   Specimen: BLOOD  Result Value Ref Range Status   Specimen Description BLOOD LEFT HAND  Final   Special Requests   Final    BOTTLES DRAWN AEROBIC AND ANAEROBIC Blood Culture adequate volume   Culture   Final    NO GROWTH 5 DAYS Performed at [facility], [address], [place], [place] [address]    Report Status 02/17/2022 FINAL  Final  Urine Culture     Status: None   Collection Time: 02/12/22 10:09 PM   Specimen: In/Out Cath Urine  Result Value Ref Range Status   Specimen Description   Final    IN/OUT CATH URINE Performed at [facility], [address], [place], [place] [address]    Special Requests   Final    NONE Performed at [facility], [address], [place], [place] [address]    Culture   Final    NO GROWTH Performed at [facility], [place] [address], [place], [place] [address]    Report Status 02/14/2022 FINAL  Final  SARS Coronavirus 2 by RT PCR (hospital order, performed in [facility] hospital lab) *cepheid single result test* Anterior Nasal Swab     Status: None   Collection Time: 02/12/22 10:28 PM   Specimen: Anterior Nasal Swab  Result Value Ref Range Status   SARS Coronavirus 2 by RT PCR NEGATIVE NEGATIVE Final    Comment: (NOTE) SARS-CoV-2 target nucleic acids are NOT DETECTED.  The SARS-CoV-2 RNA is generally detectable in upper and lower respiratory specimens during the acute phase of infection. The lowest concentration of SARS-CoV-2 viral copies this assay can detect  is 250 copies / mL. A negative result does not preclude SARS-CoV-2 infection and should not be used as the sole basis for treatment or other patient management decisions.  A negative result may occur with improper specimen collection / handling, submission of specimen other than nasopharyngeal swab, presence of viral mutation(s) within the areas targeted by this assay, and  inadequate number of viral copies (<250 copies / mL). A negative result must be combined with clinical observations, patient history, and epidemiological information.  Fact Sheet for Patients:   https://www.patel.info/  Fact Sheet for Healthcare Providers: https://hall.com/  This test is not yet approved or  cleared by the [place] FDA and has been authorized for detection and/or diagnosis of SARS-CoV-2 by FDA under an Emergency Use Authorization (EUA).  This EUA will remain in effect (meaning this test can be used) for the duration of the COVID-19 declaration under Section 564(b)(1) of the Act, 21 U.S.C. section 360bbb-3(b)(1), unless the authorization is terminated or revoked sooner.  Performed at [facility], [address], [place], [place] [address]     Radiology Studies: No results found.  Scheduled Meds:  allopurinol  200 mg Oral Daily   apixaban  5 mg Oral BID   ascorbic acid  500 mg Oral Daily   atorvastatin  20 mg Oral QHS   azithromycin  500 mg Oral Daily   cholecalciferol  5,000 Units Oral Daily   cyanocobalamin  1,000 mcg Oral Daily   docusate sodium  100 mg Oral BID   finasteride  5 mg Oral Daily   fluticasone furoate-vilanterol  1 puff Inhalation Daily   furosemide  40 mg Oral Daily   guaiFENesin  600 mg Oral BID   insulin aspart  0-5 Units Subcutaneous QHS   insulin aspart  0-9 Units Subcutaneous TID WC   insulin aspart  24 Units Subcutaneous Q breakfast   insulin aspart  30 Units Subcutaneous Q lunch   insulin aspart  30 Units  Subcutaneous Q supper   [START ON 02/18/2022] insulin glargine-yfgn  70 Units Subcutaneous Daily   ipratropium-albuterol  3 mL Nebulization TID   lisinopril  10 mg Oral QHS   magnesium oxide  400 mg Oral TID   methylPREDNISolone (SOLU-MEDROL) injection  40 mg Intravenous Q12H   pantoprazole  40 mg Oral BID AC   polyethylene glycol  17 g Oral Daily   sildenafil  20 mg Oral TID   Treprostinil  18 mcg Inhalation QID   Continuous Infusions:  ampicillin-sulbactam (UNASYN) IV 3 g (02/17/22 1014)     LOS: 4 days    Time spent: [place], MD [facility]   If 7PM-7AM, please contact night-coverage

## 2022-02-18 DIAGNOSIS — J189 Pneumonia, unspecified organism: Secondary | ICD-10-CM | POA: Diagnosis not present

## 2022-02-18 DIAGNOSIS — A419 Sepsis, unspecified organism: Secondary | ICD-10-CM | POA: Diagnosis not present

## 2022-02-18 LAB — CBC
HCT: 29.4 % — ABNORMAL LOW (ref 39.0–52.0)
Hemoglobin: 9.5 g/dL — ABNORMAL LOW (ref 13.0–17.0)
MCH: 30.7 pg (ref 26.0–34.0)
MCHC: 32.3 g/dL (ref 30.0–36.0)
MCV: 95.1 fL (ref 80.0–100.0)
Platelets: 301 10*3/uL (ref 150–400)
RBC: 3.09 MIL/uL — ABNORMAL LOW (ref 4.22–5.81)
RDW: 15.2 % (ref 11.5–15.5)
WBC: 12.9 10*3/uL — ABNORMAL HIGH (ref 4.0–10.5)
nRBC: 0.2 % (ref 0.0–0.2)

## 2022-02-18 LAB — GLUCOSE, CAPILLARY
Glucose-Capillary: 195 mg/dL — ABNORMAL HIGH (ref 70–99)
Glucose-Capillary: 332 mg/dL — ABNORMAL HIGH (ref 70–99)
Glucose-Capillary: 171 mg/dL — ABNORMAL HIGH (ref 70–99)
Glucose-Capillary: 101 mg/dL — ABNORMAL HIGH (ref 70–99)
Glucose-Capillary: 120 mg/dL — ABNORMAL HIGH (ref 70–99)

## 2022-02-18 LAB — BASIC METABOLIC PANEL
Anion gap: 10 (ref 5–15)
BUN: 62 mg/dL — ABNORMAL HIGH (ref 8–23)
CO2: 25 mmol/L (ref 22–32)
Calcium: 8.8 mg/dL — ABNORMAL LOW (ref 8.9–10.3)
Chloride: 100 mmol/L (ref 98–111)
Creatinine, Ser: 2.22 mg/dL — ABNORMAL HIGH (ref 0.61–1.24)
GFR, Estimated: 30 mL/min — ABNORMAL LOW (ref >60)
Glucose, Bld: 182 mg/dL — ABNORMAL HIGH (ref 70–99)
Potassium: 4.6 mmol/L (ref 3.5–5.1)
Sodium: 135 mmol/L (ref 135–145)

## 2022-02-18 LAB — MAGNESIUM: Magnesium: 2.3 mg/dL (ref 1.7–2.4)

## 2022-02-18 LAB — PHOSPHORUS: Phosphorus: 4.9 mg/dL — ABNORMAL HIGH (ref 2.5–4.6)

## 2022-02-18 IMAGING — CT CT BIOPSY AND ASPIRATION BONE MARROW
1 of 2 series · 9 of 14 positions shown, 12 images · non-contrast
Comparison: none

INDICATION: Symptomatic anemia. Please perform CT-guided bone marrow biopsy for
evaluation of MDS.

[Series 2: i-spiral 5.0 b30f · axial · 0.71mm/px · z∈[-325,-213]mm · 9 of 41 slices shown, 12 images]
[im 5/41  soft-tissue]
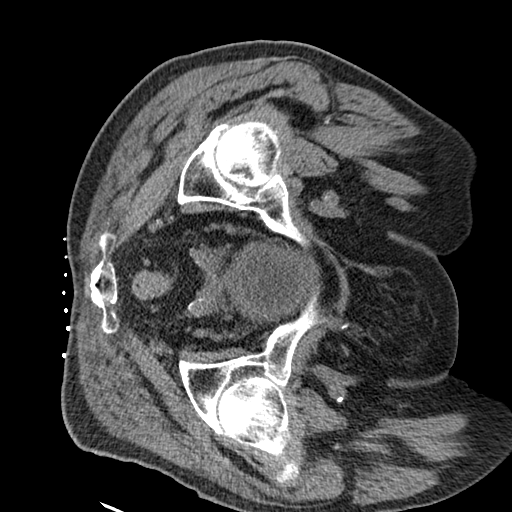
[im 5/41  bone]
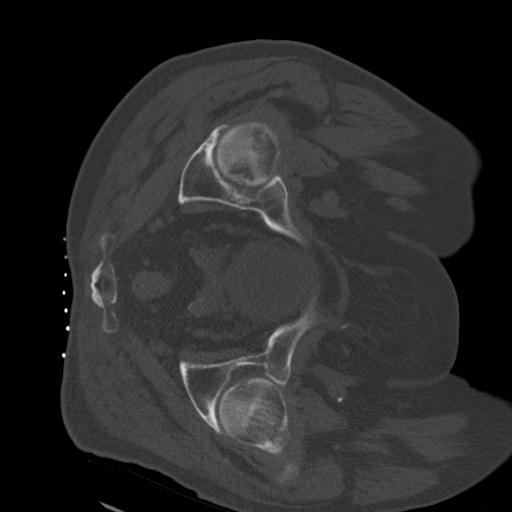
[im 9/41  bone]
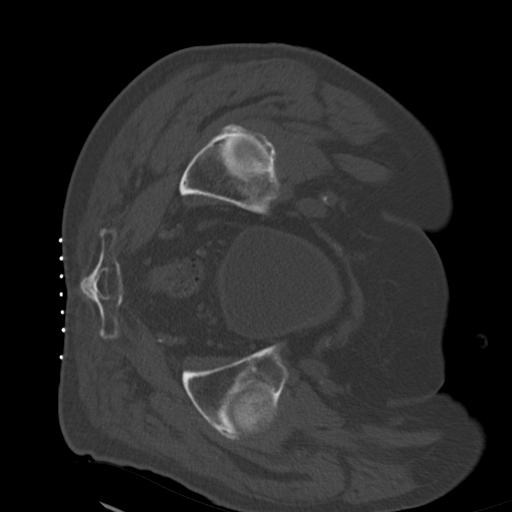
[im 13/41  bone]
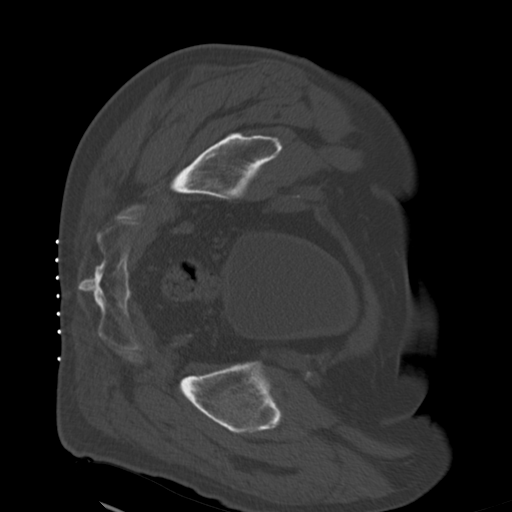
[im 17/41  bone]
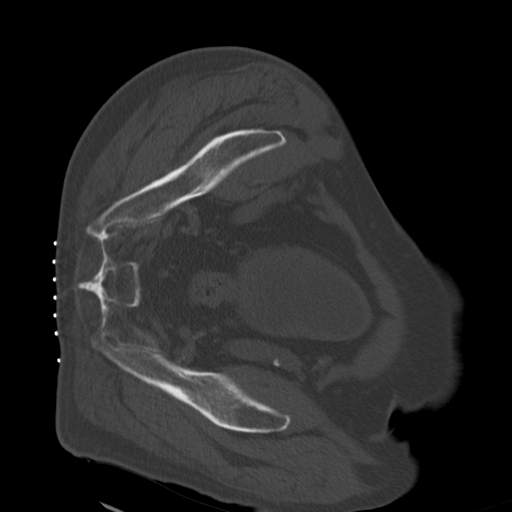
[im 21/41  soft-tissue]
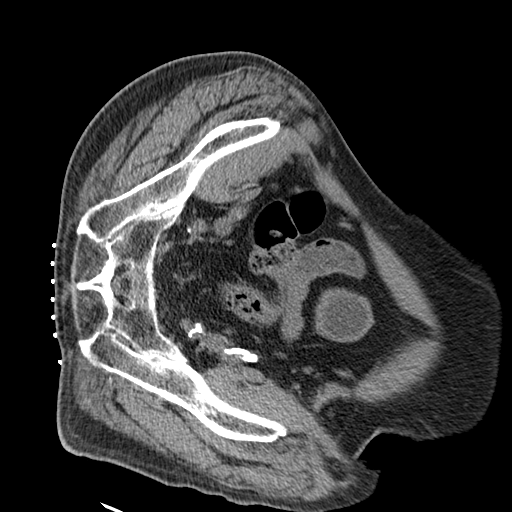
[im 21/41  bone]
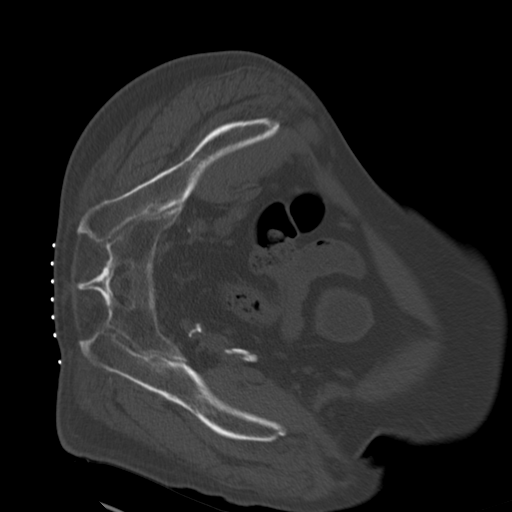
[im 25/41  bone]
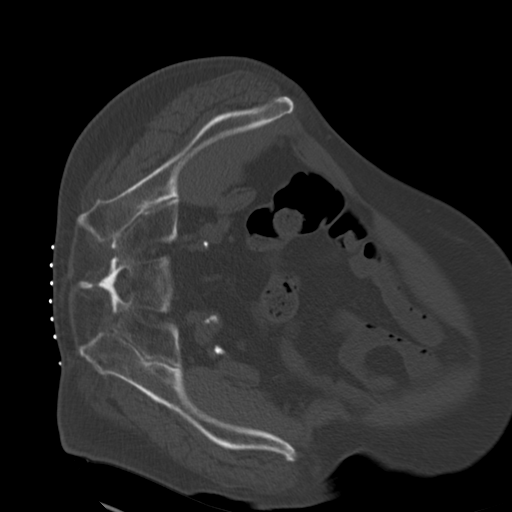
[im 29/41  bone]
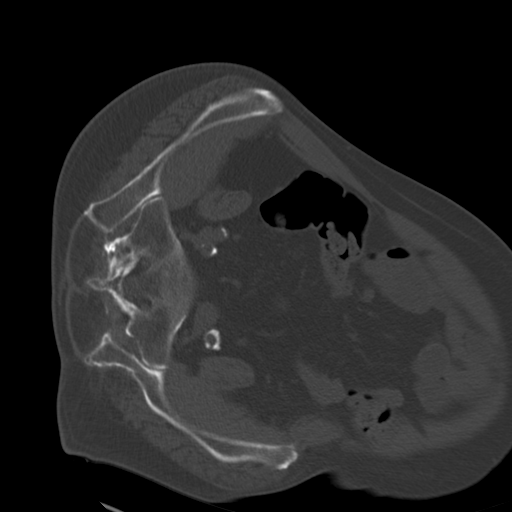
[im 33/41  bone]
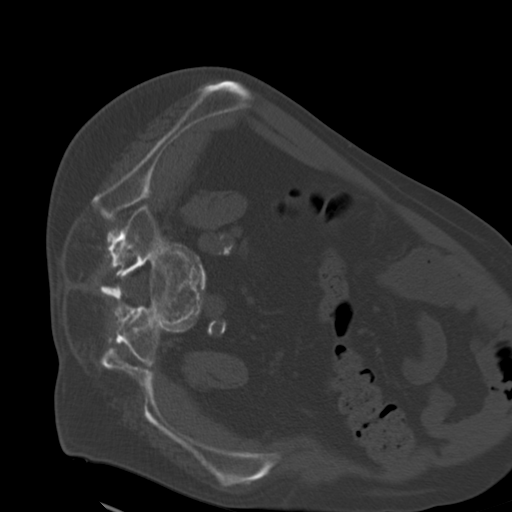
[im 37/41  soft-tissue]
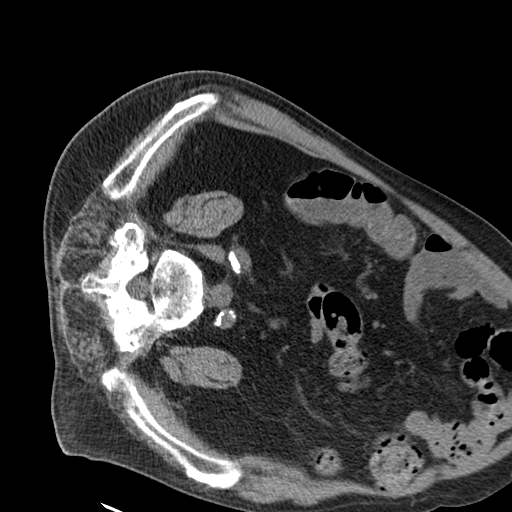
[im 37/41  bone]
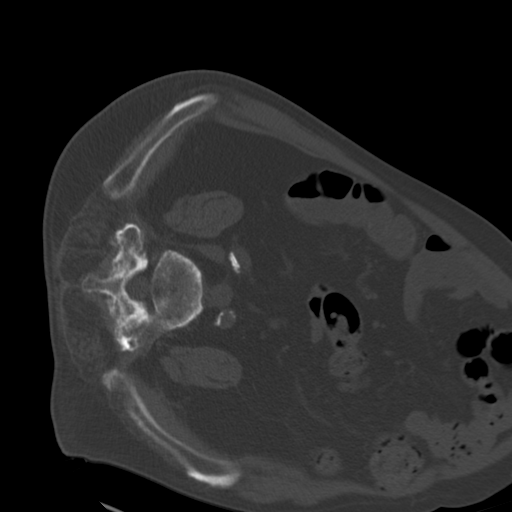

[9 of 14 positions shown; findings below may reference images not displayed]

EXAM:
CT-GUIDED BONE MARROW BIOPSY AND ASPIRATION

MEDICATIONS:
None

ANESTHESIA/SEDATION:
Fentanyl 25 mcg IV; Versed 0.5 mg IV

Sedation Time: 10 Minutes; The patient was continuously monitored
during the procedure by the interventional radiology nurse under my
direct supervision.

COMPLICATIONS:
None immediate.

PROCEDURE:
Informed consent was obtained from the patient following an
explanation of the procedure, risks, benefits and alternatives. The
patient understands, agrees and consents for the procedure. All
questions were addressed. A time out was performed prior to the
initiation of the procedure.

The patient was positioned left lateral decubitus and non-contrast
localization CT was performed of the pelvis to demonstrate the iliac
marrow spaces. The operative site was prepped and draped in the
usual sterile fashion.

Under sterile conditions and local anesthesia, a 22 gauge spinal
needle was utilized for procedural planning. Next, an 11 gauge
coaxial bone biopsy needle was advanced into the left iliac marrow
space. Needle position was confirmed with CT imaging. Initially, a
bone marrow aspiration was performed. Next, a bone marrow biopsy was
obtained with the 11 gauge outer bone marrow device. The needle was
removed and superficial hemostasis was obtained with manual
compression. A dressing was applied. The patient tolerated the
procedure well without immediate post procedural complication.
IMPRESSION: Successful CT guided left iliac bone marrow aspiration and core
biopsy.

## 2022-02-18 MED ORDER — METOPROLOL TARTRATE 25 MG PO TABS
25.0000 mg | ORAL_TABLET | Freq: Two times a day (BID) | ORAL | Status: DC
  Administered 2022-02-19: 25 mg via ORAL
  Filled 2022-02-18 (×2): qty 1

## 2022-02-18 MED ORDER — METOPROLOL TARTRATE 25 MG PO TABS
12.5000 mg | ORAL_TABLET | Freq: Once | ORAL | Status: AC
Start: 2022-02-18 — End: 2022-02-18
  Administered 2022-02-18: 12.5 mg via ORAL
  Filled 2022-02-18: qty 1

## 2022-02-18 MED ORDER — METOPROLOL TARTRATE 25 MG PO TABS
12.5000 mg | ORAL_TABLET | Freq: Two times a day (BID) | ORAL | Status: DC
  Administered 2022-02-18: 12.5 mg via ORAL
  Filled 2022-02-18: qty 1

## 2022-02-18 NOTE — Progress Notes (Signed)
PROGRESS NOTE    Jay Morris  AST:[id number] DOB: September 26, 1945 DOA: 02/12/2022  PCP: [name], MD   Brief Narrative:  This 76 years old male with PMH significant for BPH, stage IV CKD, COPD, coronary artery disease, diabetes melitis type II, hypertension, GERD, dyslipidemia who underwent hemorrhoidectomy and was discharged home.  During the evening he has experienced dry cough with associated shortness of breath and wheezing.  He was also noted to have fever and chills.  Chest x-ray showed extensive chronic lung disease with fibrosis and increased bilateral patchy airspace opacities suspicious for superimposed acute infectious or inflammatory process. Patient is admitted for severe sepsis secondary to aspiration pneumonia.  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)   Type II diabetes mellitus with renal manifestations (HCC)   HTN (hypertension)   BPH (benign prostatic hyperplasia)   Chronic obstructive pulmonary disease (COPD) ([place])   GERD without esophagitis   Severe sepsis secondary to pneumonia: Suspect aspiration with history of recent surgery performed on  02/11/2022 Patient presented with increasing shortness of breath, tachycardia, fever 103.2, tachypnea, elevated WBC, lactic acidosis chest x-ray shows bilateral patches airspace opacity superimposed infection Patient has received broad-spectrum antibiotic per sepsis protocol Continue IV Unasyn and  azithromycin. Blood cultures and urine cultures are negative. Procalcitonin 5.50, sepsis physiology has resolved. Consider discharge on p.o. antibiotics in a day or 2.  Acute on chronic hypoxic respiratory failure: Likely secondary to chronic interstitial lung disease with chronic pulmonary hypertension and chronic home oxygen. Patient regularly follows up  with Dr. Raul Del. Continue IV Solu-Medrol, inhalers, bronchodilator nebs--  change to PO steroids from tomorrow if remains stable. Continue home meds and nebulizer for pulmonary hypertension Patient currently on 5 L [place] (normally uses at home) Given Lasix 40 mg IV x1yesterday   Pulmonary hypertension, moderate to severe (HCC) Continue Revatio and Tyvaso.   Diabetes type 2 with CKD stage IV Continue sliding scale insulin. Semglee and Novolog tid with meals. Continue Semglee to 70 units since patient is on steroids.   Recent Hemorrhoidectomy surgery Continue bowel regimen per Dr Lysle Pearl Avoid constipation   Essential Hypertension: Continue lisinopril and Lasix.   CKD stage IV: Stable.  Baseline creatinine around 2.3.    Paroxysmal atrial fibrillation: Continue Eliquis. Patient heart rate went into 150s. Patient resumed on metoprolol 25 mg twice daily. Cardiology is consulted.  Heart rate has improved.    DVT prophylaxis: Eliquis Code Status: Full code Family Communication: Wife at bedside Disposition Plan:   Status is: Inpatient Remains inpatient appropriate because:  Admitted for severe sepsis due to aspiration pneumonia and acute on chronic hypoxic respiratory failure. Patient went into A-fib with RVR, resumed home metoprolol dose.  Heart rate is improved.   Anticipated discharge home 02/19/2022.  Consultants:  None  Procedures: None Antimicrobials:   Anti-infectives (From admission, onward)    Start     Dose/Rate Route Frequency Ordered Stop   02/16/22 2200  azithromycin (ZITHROMAX) tablet 500 mg        500 mg Oral Daily 02/16/22 1243 02/17/22 2043   02/13/22 2300  cefTRIAXone (ROCEPHIN) 2 g in sodium chloride 0.9 % 100 mL IVPB  Status:  Discontinued        2 g 200 mL/hr over 30 Minutes Intravenous Every 24 hours 02/13/22 0114 02/13/22 0547   02/13/22 1000  Ampicillin-Sulbactam (  UNASYN) 3 g in sodium chloride 0.9 % 100 mL IVPB        3 g 200 mL/hr over 30  Minutes Intravenous Every 12 hours 02/13/22 0547 02/18/22 2359   02/13/22 0115  azithromycin (ZITHROMAX) 500 mg in sodium chloride 0.9 % 250 mL IVPB  Status:  Discontinued        500 mg 250 mL/hr over 60 Minutes Intravenous Every 24 hours 02/13/22 0114 02/16/22 1243   02/12/22 2300  vancomycin (VANCOCIN) IVPB 1000 mg/200 mL premix        1,000 mg 200 mL/hr over 60 Minutes Intravenous  Once 02/12/22 2247 02/13/22 0006   02/12/22 2300  ceFEPIme (MAXIPIME) 2 g in sodium chloride 0.9 % 100 mL IVPB        2 g 200 mL/hr over 30 Minutes Intravenous  Once 02/12/22 2247 02/12/22 2323   02/12/22 2300  metroNIDAZOLE (FLAGYL) IVPB 500 mg        500 mg 100 mL/hr over 60 Minutes Intravenous  Once 02/12/22 2247 02/13/22 0012        Subjective: Patient was seen and examined at bedside.  Overnight events noted.   Patient reports having palpitations, fluttering.  Patient remains on 3 L of supplemental oxygen. Patient resumed on metoprolol, feeling slightly better afterwards,  not comfortable to go home today.  Objective: Vitals:   02/18/22 0728 02/18/22 0757 02/18/22 0928 02/18/22 1140  BP: 137/75  124/72 (!) 111/58  Pulse: 71   62  Resp: 18   19  Temp: 97.7 F (36.5 C)   98.1 F (36.7 C)  TempSrc:      SpO2: 98% 98%  96%  Weight:      Height:        Intake/Output Summary (Last 24 hours) at 02/18/2022 1454 Last data filed at 02/18/2022 1142 Gross per 24 hour  Intake 960 ml  Output 2600 ml  Net -1640 ml    Filed Weights   02/12/22 2204  Weight: 82.6 kg    Examination:  General exam: Appears comfortable, not in any acute distress, deconditioned. Respiratory system: Clear to auscultation bilaterally, no wheezing, no crackles. Cardiovascular system: S1 & S2 heard, irregular rhythm, no murmur Gastrointestinal system: Abdomen is soft, nontender, nondistended, BS+. Central nervous system: Alert and oriented X 3. No focal neurological deficits. Extremities: No edema, no cyanosis, no  clubbing. Skin: No rashes, lesions or ulcers Psychiatry: Judgement and insight appear normal. Mood & affect appropriate.     Data Reviewed: I have personally reviewed following labs and imaging studies  CBC: Recent Labs  Lab 02/12/22 2209 02/13/22 0639 02/15/22 0608 02/18/22 0459  WBC 19.4* 27.4* 15.1* 12.9*  NEUTROABS 17.5*  --   --   --   HGB 9.6* 8.5* 8.1* 9.5*  HCT 30.7* 26.8* 25.4* 29.4*  MCV 100.0 100.0 97.3 95.1  PLT 229 218 224 062    Basic Metabolic Panel: Recent Labs  Lab 02/12/22 2209 02/13/22 0639 02/15/22 0608 02/17/22 0435 02/18/22 0459  NA 140 138 138 137 135  K 3.9 4.7 4.6 4.2 4.6  CL 105 106 104 97* 100  CO2 _0 GLUCOSE 103* 140* 229* 199* 182*  BUN 26* 26* 37* 50* 62*  CREATININE 2.19* 2.33* 2.25* 2.24* 2.22*  CALCIUM 8.6* 8.1* 8.9 9.2 8.8*  MG  --   --   --   --  2.3  PHOS  --   --   --   --  4.9*  GFR: Estimated Creatinine Clearance: 28.7 mL/min (A) (by C-G formula based on SCr of 2.22 mg/dL (H)). Liver Function Tests: Recent Labs  Lab 02/12/22 2209  AST 23  ALT 21  ALKPHOS 34*  BILITOT 1.1  PROT 6.8  ALBUMIN 3.9    No results for input(s): "LIPASE", "AMYLASE" in the last 168 hours. No results for input(s): "AMMONIA" in the last 168 hours. Coagulation Profile: Recent Labs  Lab 02/12/22 2209 02/13/22 0639  INR 1.2 1.2    Cardiac Enzymes: No results for input(s): "CKTOTAL", "CKMB", "CKMBINDEX", "TROPONINI" in the last 168 hours. BNP (last 3 results) No results for input(s): "PROBNP" in the last 8760 hours. HbA1C: No results for input(s): "HGBA1C" in the last 72 hours. CBG: Recent Labs  Lab 02/17/22 1224 02/17/22 1641 02/17/22 2030 02/18/22 0733 02/18/22 1140  GLUCAP 306* 153* 206* 195* 332*    Lipid Profile: No results for input(s): "CHOL", "HDL", "LDLCALC", "TRIG", "CHOLHDL", "LDLDIRECT" in the last 72 hours. Thyroid Function Tests: No results for input(s): "TSH", "T4TOTAL", "FREET4", "T3FREE",  "THYROIDAB" in the last 72 hours. Anemia Panel: No results for input(s): "VITAMINB12", "FOLATE", "FERRITIN", "TIBC", "IRON", "RETICCTPCT" in the last 72 hours. Sepsis Labs: Recent Labs  Lab 02/12/22 2209 02/13/22 0102 02/13/22 0639  PROCALCITON  --   --  5.50  LATICACIDVEN 2.7* 2.6*  --      Recent Results (from the past 240 hour(s))  Blood Culture (routine x 2)     Status: None   Collection Time: 02/12/22 10:09 PM   Specimen: BLOOD  Result Value Ref Range Status   Specimen Description BLOOD LEFT ARM  Final   Special Requests   Final    BOTTLES DRAWN AEROBIC AND ANAEROBIC Blood Culture adequate volume   Culture   Final    NO GROWTH 5 DAYS Performed at [facility], [address] [place], [place] [address]    Report Status 02/17/2022 FINAL  Final  Blood Culture (routine x 2)     Status: None   Collection Time: 02/12/22 10:09 PM   Specimen: BLOOD  Result Value Ref Range Status   Specimen Description BLOOD LEFT HAND  Final   Special Requests   Final    BOTTLES DRAWN AEROBIC AND ANAEROBIC Blood Culture adequate volume   Culture   Final    NO GROWTH 5 DAYS Performed at [facility], [address] [place], [place] [address]    Report Status 02/17/2022 FINAL  Final  Urine Culture     Status: None   Collection Time: 02/12/22 10:09 PM   Specimen: In/Out Cath Urine  Result Value Ref Range Status   Specimen Description   Final    IN/OUT CATH URINE Performed at [facility], [address], [place], [place] [address]    Special Requests   Final    NONE Performed at [facility], [address] [place], [place] [address]    Culture   Final    NO GROWTH Performed at [facility], [place] [address] [place], [place] [address]    Report Status 02/14/2022 FINAL  Final  SARS Coronavirus 2 by RT PCR (hospital order, performed in [facility] hospital lab) *cepheid single result test* Anterior Nasal Swab     Status: None   Collection  Time: 02/12/22 10:28 PM   Specimen: Anterior Nasal Swab  Result Value Ref Range Status   SARS Coronavirus 2 by RT PCR NEGATIVE NEGATIVE Final    Comment: (NOTE) SARS-CoV-2 target  nucleic acids are NOT DETECTED.  The SARS-CoV-2 RNA is generally detectable in upper and lower respiratory specimens during the acute phase of infection. The lowest concentration of SARS-CoV-2 viral copies this assay can detect is 250 copies / mL. A negative result does not preclude SARS-CoV-2 infection and should not be used as the sole basis for treatment or other patient management decisions.  A negative result may occur with improper specimen collection / handling, submission of specimen other than nasopharyngeal swab, presence of viral mutation(s) within the areas targeted by this assay, and inadequate number of viral copies (<250 copies / mL). A negative result must be combined with clinical observations, patient history, and epidemiological information.  Fact Sheet for Patients:   https://www.patel.info/  Fact Sheet for Healthcare Providers: https://hall.com/  This test is not yet approved or  cleared by the [place] FDA and has been authorized for detection and/or diagnosis of SARS-CoV-2 by FDA under an Emergency Use Authorization (EUA).  This EUA will remain in effect (meaning this test can be used) for the duration of the COVID-19 declaration under Section 564(b)(1) of the Act, 21 U.S.C. section 360bbb-3(b)(1), unless the authorization is terminated or revoked sooner.  Performed at [facility], [address], [place], [place] [address]     Radiology Studies: No results found.  Scheduled Meds:  allopurinol  200 mg Oral Daily   apixaban  5 mg Oral BID   ascorbic acid  500 mg Oral Daily   atorvastatin  20 mg Oral QHS   cholecalciferol  5,000 Units Oral Daily   cyanocobalamin  1,000 mcg Oral Daily   docusate sodium  100 mg Oral  BID   finasteride  5 mg Oral Daily   fluticasone furoate-vilanterol  1 puff Inhalation Daily   furosemide  40 mg Oral Daily   guaiFENesin  600 mg Oral BID   insulin aspart  0-5 Units Subcutaneous QHS   insulin aspart  0-9 Units Subcutaneous TID WC   insulin aspart  24 Units Subcutaneous Q breakfast   insulin aspart  30 Units Subcutaneous Q lunch   insulin aspart  30 Units Subcutaneous Q supper   insulin glargine-yfgn  70 Units Subcutaneous Daily   ipratropium-albuterol  3 mL Nebulization Daily   lisinopril  10 mg Oral QHS   magnesium oxide  400 mg Oral TID   methylPREDNISolone (SOLU-MEDROL) injection  40 mg Intravenous Q12H   metoprolol tartrate  25 mg Oral BID   pantoprazole  40 mg Oral BID AC   polyethylene glycol  17 g Oral Daily   sildenafil  20 mg Oral TID   Treprostinil  18 mcg Inhalation QID   Continuous Infusions:  ampicillin-sulbactam (UNASYN) IV 3 g (02/18/22 0906)     LOS: 5 days    Time spent: [place], MD [facility]   If 7PM-7AM, please contact night-coverage

## 2022-02-18 NOTE — Consult Note (Signed)
[place] NOTE       Patient ID: Jay Morris MRN: [id number] DOB/AGE: February 17, 1946 76 y.o.  Admit date: 02/12/2022 Referring Physician Dr. Shawna Clamp Primary Physician Dr. Doy Hutching Primary Cardiologist Dr. Nehemiah Massed  Reason for Consultation AF RVR  HPI: Tron Flythe is a [phone] with a PMH of paroxysmal atrial fibrillation on Eliquis, severe pulmonary hypertension, chronic respiratory failure on 3 L at baseline, CKD 4, type 2 diabetes, hypertension, hyperlipidemia, s/p hemorrhoidectomy 02/12/2022 who presented to [facility] ED the day following his procedure with nausea and dry heaving in addition to dry cough and shortness of breath.  He was febrile and chest x-ray showed bilateral infiltrates concerning for pneumonia.  He was treated with broad-spectrum antibiotics and was doing well, awaiting discharge on hospital day 6 when he developed A-fib with RVR, for which cardiology was consulted.  The patient presents with his wife who contributes to the history.  He says that he was feeling badly at the day following his hemorrhoidectomy, had a fever with short of breath and was coughing.  He has been treated with broad-spectrum antibiotics for pneumonia and has been weaned back to his baseline supplemental oxygen of 3 L today.  His wife notes that the patient has not gotten any of his home metoprolol tartrate 25 mg that he takes twice daily and the 5 days he had been in the hospital end I confirmed this with his med rec/history.  The patient today says he feels "back to his normal self" with his breathing much improved.  He notes he has had occasional palpitations that are short lasting over the past few days, but notes today he did not really feel any palpitations until his nurse came in and told him his heart rate was high again.  He felt some palpitations and mild chest tightness for about 30 minutes but this is since resolved.  Review of telemetry notes some periods of tachycardia  lasting less than 2 minutes overnight, but this morning he abruptly went into what looks like atrial fibrillation versus flutter with rates in the 140s to 150s sustained for several hours.  He was given 12.5 mg of metoprolol x2 doses this morning with good control of his heart rate, coming down to the mid 90s during interview.  Recent vitals are notable for a blood pressure of 111/58, heart rate in the high 90s and what looks like atrial flutter on telemetry.  O2 sat 95% on his baseline of 3 L.  Recent labs are notable for a potassium of 4.6, BUN/creatinine 62/2.22, GFR 30 which is around his baseline.  Significantly improving leukocytosis with WBCs downtrending from 27.4-12.9 today with stable anemia with hemoglobin 9.5/29.4, platelets 301.  Review of systems complete and found to be negative unless listed above     Past Medical History:  Diagnosis Date   (HFpEF) heart failure with preserved ejection fraction ([place])    a.) TTE 04/01/2016: EF 60-65%, LAE, MAC, mild MR/AR/TR, mild-mod PR, G2DD; b.) TTE 12/18/2018: EF 55%, mild LVH, mild AR/MR/TR. mod PR; c.) TTE 05/31/2019: EF 60-65%, mild LVH, RAE/RVE, triv AR.MT, PR, mild TR; d.) TTE 10/26/2019: EF 55%, BAE, RVE, triv AR, mild MR/PR, sev TR, G2DD; e.) R/LHC 01/17/2020: mPA 52, PCWP 25, LVEDP 19, RVEDP 25, mRA 13   Anemia    Aortic atherosclerosis (HCC)    Asthma    BPH (benign prostatic hyperplasia)    CKD (chronic kidney disease), stage III (HCC)    COPD (chronic obstructive pulmonary disease) (  [place])    Coronary artery disease 01/17/2020   a.) LHC 01/17/2020: 25% oLAD, 65% o-pRCA, 45% pLAD, 65% mLAD, 35% OM2 --> med mgmt.   DDD (degenerative disc disease), lumbar    GERD (gastroesophageal reflux disease)    GI bleed 54/2706   Gout    Helicobacter pylori gastritis    Hemorrhoids    History of 2019 novel coronavirus disease (COVID-19) 05/30/2019   History of asbestos exposure    Hyperlipidemia    Hypertension    Long term current use of  anticoagulant    a.) apixaban   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/03/2019   PAF (paroxysmal atrial fibrillation) (HCC)    a.) CHA2DS2-VASc = 6 (age x 2, HFpEF, HTN, vascular disease, T2DM);  b.) cardiac ablation in approx 2014-2015 at [facility]; c.) A.fib recurrence in setting of SARS-CoV-2 --> Tx'd with IV amiodarone + metoprolol + DCCV x 1 (200 J) 06/22/2019 restoring NSR; d.) rate/rhythm maintained without pharmacological intervention; chronically anticoagulated using standard dose apixaban   Pneumonia due to COVID-19 virus 06/02/2019   a.) hospitalized at [facility]) from 06/02/2019 - 06/25/2019; Tx'd with steriods + remdesivir + convalescent plasma   Pulmonary fibrosis (HCC)    Pulmonary HTN ([place]) 04/01/2016   a.) TTE 04/01/2016: EF 60-65%, RVSP 40; b.) TTE 12/18/2018: EF 55%, RVSP 45.9; c.) TTE 05/31/2019: EF 60-65%, RVSP 35.5; d.) TTE 10/26/2019: EF 55%, RVSP 100.4; e.) R/LHC 01/17/2020: mPA 52, PCWP 25, LVEDP 19, RVEDP 25, mRA 13; f.) Tx with PDE5i (sildenafil) + vasodilator (treprostinil)   Type 2 diabetes mellitus treated with insulin ([place])     Past Surgical History:  Procedure Laterality Date   BACK SURGERY     x3 L3-L4    CARDIAC ELECTROPHYSIOLOGY MAPPING AND ABLATION N/A    performed at [place] in approximately 2014-2015   CARDIOVERSION N/A 06/22/2019   Procedure: CARDIOVERSION;  Surgeon: Fay Records, MD;  Location: [place];  Service: Cardiovascular;  Laterality: N/A;   COLONOSCOPY WITH PROPOFOL N/A 06/15/2017   Procedure: COLONOSCOPY WITH PROPOFOL;  Surgeon: Toledo, Benay Pike, MD;  Location: [facility] ENDOSCOPY;  Service: Gastroenterology;  Laterality: N/A;   ENTEROSCOPY N/A 11/24/2017   Procedure: ENTEROSCOPY;  Surgeon: Jonathon Bellows, MD;  Location: [facility] ENDOSCOPY;  Service: Gastroenterology;  Laterality: N/A;   ESOPHAGOGASTRODUODENOSCOPY N/A 11/27/2017   Procedure: ESOPHAGOGASTRODUODENOSCOPY (EGD);  Surgeon: Lucilla Lame, MD;  Location: [facility]  ENDOSCOPY;  Service: Endoscopy;  Laterality: N/A;   ESOPHAGOGASTRODUODENOSCOPY (EGD) WITH PROPOFOL N/A 06/02/2017   Procedure: ESOPHAGOGASTRODUODENOSCOPY (EGD) WITH PROPOFOL;  Surgeon: Toledo, Benay Pike, MD;  Location: [facility] ENDOSCOPY;  Service: Gastroenterology;  Laterality: N/A;   ESOPHAGOGASTRODUODENOSCOPY (EGD) WITH PROPOFOL N/A 08/04/2021   Procedure: ESOPHAGOGASTRODUODENOSCOPY (EGD) WITH PROPOFOL;  Surgeon: Lesly Rubenstein, MD;  Location: [facility] ENDOSCOPY;  Service: Endoscopy;  Laterality: N/A;   EVALUATION UNDER ANESTHESIA WITH HEMORRHOIDECTOMY N/A 02/12/2022   Procedure: EXAM UNDER ANESTHESIA WITH HEMORRHOIDECTOMY;  Surgeon: Benjamine Sprague, DO;  Location: [facility] ORS;  Service: General;  Laterality: N/A;   GIVENS CAPSULE STUDY N/A 11/25/2017   Procedure: GIVENS CAPSULE STUDY;  Surgeon: Jonathon Bellows, MD;  Location: [facility] ENDOSCOPY;  Service: Gastroenterology;  Laterality: N/A;   HAND SURGERY     RIGHT/LEFT HEART CATH AND CORONARY ANGIOGRAPHY N/A 01/17/2020   Procedure: RIGHT/LEFT HEART CATH AND CORONARY ANGIOGRAPHY;  Surgeon: Corey Skains, MD;  Location: [place] CV LAB;  Service: Cardiovascular;  Laterality: N/A;    Medications Prior to Admission  Medication Sig Dispense Refill Last Dose  acetaminophen (TYLENOL) 500 MG tablet Take 500 mg by mouth every 6 (six) hours as needed for moderate pain or headache. 30 tablet 0 prn at prn   albuterol (VENTOLIN HFA) 108 (90 Base) MCG/ACT inhaler Inhale 2 puffs into the lungs daily.   02/12/2022   allopurinol (ZYLOPRIM) 100 MG tablet Take 2 tablets by mouth daily.   02/12/2022   ascorbic acid (VITAMIN C) 500 MG tablet Take 500 mg by mouth daily.   Past Week   atorvastatin (LIPITOR) 20 MG tablet Take 20 mg by mouth at bedtime.    02/11/2022   BREO ELLIPTA 100-25 MCG/INH AEPB Inhale 1 puff into the lungs daily.   02/12/2022   Cholecalciferol (DIALYVITE VITAMIN D 5000) 125 MCG (5000 UT) capsule Take 5,000 Units by mouth daily.   Past Week   ELIQUIS 5  MG TABS tablet SMARTSIG:1 Tablet(s) By Mouth Every 12 Hours   Past Week   finasteride (PROSCAR) 5 MG tablet TAKE 1 TABLET BY MOUTH DAILY 30 tablet 11 02/12/2022   furosemide (LASIX) 80 MG tablet Take 40 mg by mouth daily.   02/12/2022   insulin aspart (NOVOLOG) 100 UNIT/ML FlexPen Inject 20-26 Units into the skin 3 (three) times daily with meals. 20 units am 26 units noon, 26 units evening   02/12/2022   insulin degludec (TRESIBA FLEXTOUCH) 200 UNIT/ML FlexTouch Pen Inject 60 Units into the skin in the morning and at bedtime.   02/12/2022   lisinopril (ZESTRIL) 20 MG tablet Take 10 mg by mouth at bedtime.    02/11/2022   magnesium oxide (MAG-OX) 400 MG tablet Take 1 tablet by mouth 3 (three) times daily.   02/12/2022   nystatin-triamcinolone ointment (MYCOLOG) Apply 1 Application topically 2 (two) times daily. Do not use for longer than 4-6 weeks. 30 g 1 Past Week   oxyCODONE (ROXICODONE) 5 MG immediate release tablet Take 1 tablet (5 mg total) by mouth every 8 (eight) hours as needed. 15 tablet 0 prn at prn   pantoprazole (PROTONIX) 40 MG tablet Take 1 tablet (40 mg total) by mouth 2 (two) times daily before a meal. 60 tablet 0 02/12/2022   sildenafil (REVATIO) 20 MG tablet Take 20 mg by mouth 3 (three) times daily.   02/12/2022   TYVASO REFILL 0.6 MG/ML SOLN Inhale 18 mcg into the lungs in the morning, at noon, in the evening, and at bedtime.   02/12/2022   vitamin B-12 (CYANOCOBALAMIN) 1000 MCG tablet Take 1,000 mcg by mouth daily.   Past Week   lidocaine (XYLOCAINE) 5 % ointment Apply 1 Application topically 3 (three) times daily as needed. Apply to affected area as needed 3 times daily 35.44 g 0 prn at prn   Social History   Socioeconomic History   Marital status: Married    Spouse name: Peter Congo    Number of children: 2   Years of education: Not on file   Highest education level: Not on file  Occupational History   Occupation: retired     Comment: [employer]   Tobacco Use   Smoking status:  Former    Packs/day: 1.00    Years: 30.00    Total pack years: 30.00    Types: Cigarettes    Quit date: 1995    Years since quitting: 28.6   Smokeless tobacco: Never  Vaping Use   Vaping Use: Never used  Substance and Sexual Activity   Alcohol use: No    Alcohol/week: 0.0 standard drinks of alcohol  Comment: occasional   Drug use: No   Sexual activity: Yes  Other Topics Concern   Not on file  Social History Narrative   Lives at home with wife    Social Determinants of Health   Financial Resource Strain: Not on file  Food Insecurity: Not on file  Transportation Needs: Not on file  Physical Activity: Not on file  Stress: Not on file  Social Connections: Not on file  Intimate Partner Violence: Not on file    Family History  Problem Relation Age of Onset   Heart disease Mother    Cancer Father    Cancer Paternal Grandfather       PHYSICAL EXAM General: Pleasant elderly and conversational Caucasian male sitting upright in recliner eating lunch with his wife at bedside. HEENT:  Normocephalic and atraumatic. Neck:  No JVD.  Lungs: Normal respiratory effort on his baseline 3 L, speaking in complete sentences without dyspnea.  Very trace expiratory wheezes on the left, clear to auscultation otherwise Heart: Irregular irregular rhythm with controlled rate. Normal S1 and S2 without gallops or murmurs.  Abdomen: Obese appearing.  Msk: Normal strength and tone for age. Extremities: Warm and well perfused. No clubbing, cyanosis.  No significant peripheral edema.  Neuro: Alert and oriented X 3. Psych:  Answers questions appropriately.   Labs:   Lab Results  Component Value Date   WBC 12.9 (H) 02/18/2022   HGB 9.5 (L) 02/18/2022   HCT 29.4 (L) 02/18/2022   MCV 95.1 02/18/2022   PLT 301 02/18/2022    Recent Labs  Lab 02/12/22 2209 02/13/22 0639 02/18/22 0459  NA 140   < > 135  K 3.9   < > 4.6  CL 105   < > 100  CO2 25   < > 25  BUN 26*   < > 62*  CREATININE  2.19*   < > 2.22*  CALCIUM 8.6*   < > 8.8*  PROT 6.8  --   --   BILITOT 1.1  --   --   ALKPHOS 34*  --   --   ALT 21  --   --   AST 23  --   --   GLUCOSE 103*   < > 182*   < > = values in this interval not displayed.   Lab Results  Component Value Date   TROPONINI <0.03 12/02/2017   No results found for: "CHOL" No results found for: "HDL" No results found for: "[place]" No results found for: "TRIG" No results found for: "CHOLHDL" No results found for: "LDLDIRECT"    Radiology: [facility] Chest Port 1 View  Result Date: 02/12/2022 CLINICAL DATA:  Possible sepsis EXAM: PORTABLE CHEST 1 VIEW COMPARISON:  CT 10/03/2019, chest x-ray 08/03/2021, 06/23/2019, 01/04/2016 FINDINGS: Diffuse bilateral coarse reticular interstitial opacity consistent with fibrosis and chronic lung disease. Increased bilateral patchy opacities compared to prior suggesting acute superimposed infectious or inflammatory process. Stable cardiomediastinal silhouette with aortic atherosclerosis. No pneumothorax IMPRESSION: Extensive chronic lung disease and fibrosis. Increased bilateral patchy airspace opacities suspicious for superimposed acute infectious or inflammatory process Electronically Signed   By: Donavan Foil M.D.   On: 02/12/2022 22:46    ECHO 10/26/2019                         [facility]                                    [id number]            [facility]                           Acct #: [id number]            [address] [place], [place] [address]       Date: 10/26/2019 10: 06 AM                                                               Adult   Male   Age: 71 yrs            ECHOCARDIOGRAM REPORT                              Outpatient                                                               [facility]       STUDY:CHEST WALL               TAPE:0000: 00: 0: 00: 00 MD1: Nehemiah Massed, MD Bruce        ECHO:Yes    DOPPLER:Yes        FILE:[id number]        BP: 121/52 mmHg       COLOR:Yes   CONTRAST:No     MACHINE:Philips   RV BIOPSY:No          3D:No  SOUND QLTY:Moderate            Height: 68 in      MEDIUM:None                                              Weight: 192 lb                                                               BSA: 2.0 m2  _________________________________________________________________________________________                HISTORY: Recent A.Fib                 REASON: Assess, LV function             INDICATION: I48.0 Paroxysmal A-fib, R06.02 Shortness of breath  _________________________________________________________________________________________  ECHOCARDIOGRAPHIC MEASUREMENTS  2D DIMENSIONS  AORTA  Values   Normal Range   MAIN PA         Values    Normal Range                Annulus: nm*          [2.3-2.9]         PA Main: nm*       [1.5-2.1]              Aorta Sin: 3.4 cm       [3.1-3.7]    RIGHT VENTRICLE            ST Junction: nm*          [2.6-3.2]         RV Base: 4.2 cm    [< 4.2]              Asc.Aorta: nm*          [2.6-3.4]          RV Mid: nm*       [<3.5]  LEFT VENTRICLE                                      RV Length: nm*       [<8.6]                  LVIDd: 4.7 cm       [4.2-5.9]    INFERIOR VENA CAVA                  LVIDs: 3.4 cm                        Max. IVC: nm*       [<=2.1]                     FS: 28.7 %       [>25]            Min. IVC: nm*                    SWT: 1.2 cm       [0.6-1.0]    ------------------                    PWT: 1.1 cm       [0.6-1.0]    nm* - not measured  LEFT ATRIUM                LA Diam: 4.4 cm       [3.0-4.0]            LA A4C Area: nm*          [<20]              LA Volume: nm*          [18-58]  _________________________________________________________________________________________  ECHOCARDIOGRAPHIC DESCRIPTIONS  AORTIC ROOT                   Size: Normal             Dissection: INDETERM FOR DISSECTION  AORTIC  VALVE               Leaflets: Tricuspid                   Morphology: Normal  Mobility: Fully mobile  LEFT VENTRICLE                   Size: Normal                        Anterior: Normal            Contraction: Normal                         Lateral: Normal             Closest EF: >55% (Estimated)                Septal: Normal              LV Masses: No Masses                       Apical: Normal                    LVH: None                          Inferior: Normal                                                      Posterior: Normal           Dias.FxClass: (Grade 2) relaxation abnormal, pseudonormal  MITRAL VALVE               Leaflets: Normal                        Mobility: Fully mobile             Morphology: Normal  LEFT ATRIUM                   Size: MILDLY ENLARGED              LA Masses: No masses              IA Septum: Normal IAS  MAIN PA                   Size: Normal  PULMONIC VALVE             Morphology: Normal                        Mobility: Fully mobile  RIGHT VENTRICLE                   Size: MILDLY ENLARGED              Free Wall: Normal            Contraction: Normal                       RV Masses: No Masses  TRICUSPID VALVE               Leaflets: Normal                        Mobility: Fully mobile  Morphology: Normal  RIGHT ATRIUM                   Size: MILDLY ENLARGED               RA Other: None                RA Mass: No masses  PERICARDIUM                  Fluid: TRIVIAL EFFUSION POSTERIOR  INFERIOR VENACAVA                   Size: DILATED Normal respiratory collapse  _________________________________________________________________________________________   DOPPLER ECHO and OTHER SPECIAL PROCEDURES                 Aortic: TRIVIAL AR                 No AS                         151.2 cm/sec peak vel      9.1 mmHg peak grad                         3.7 mmHg mean grad         3.1 cm^2 by DOPPLER                 Mitral: MILD  MR                    No MS                         MV Inflow E Vel = 123.0 cm/sec      MV Annulus E'Vel = 5.2 cm/sec                         E/E'Ratio = 23.7              Tricuspid: SEVERE TR                  No TS                         480.5 cm/sec peak TR vel   100.4 mmHg peak RV pressure              Pulmonary: MILD PR                    No PS  _________________________________________________________________________________________  INTERPRETATION  NORMAL LEFT VENTRICULAR SYSTOLIC FUNCTION   WITH MILD LVH  NORMAL RIGHT VENTRICULAR SYSTOLIC FUNCTION  SEVERE VALVULAR REGURGITATION (See above)  NO VALVULAR STENOSIS  TRIVIAL PERICARDIAL EFFUSION  MILD RV ENLARGEMENT  MILD BIATRIAL ENLARGEMENT  MILD LVH  SEVERE PULMONARY HYPERTENSION   TELEMETRY reviewed by me: Atrial flutter with RVR that started this morning with rates in the 140s-150s, improved to a flutter with rates in the 90s after administration of metoprolol  EKG reviewed by me: SVT rate 146, possibly c/w atrial flutter with RVR   ASSESSMENT AND PLAN:  Jay Morris is a 77yoM with a PMH of paroxysmal atrial fibrillation on Eliquis, severe pulmonary hypertension, chronic respiratory failure on 3 L at baseline, CKD 4, type 2 diabetes, hypertension, hyperlipidemia, s/p hemorrhoidectomy 02/12/2022 who presented to [facility] ED the day following his procedure  with nausea and dry heaving in addition to dry cough and shortness of breath.  He was febrile and chest x-ray showed bilateral infiltrates concerning for pneumonia.  He was treated with broad-spectrum antibiotics and was doing well, awaiting discharge on hospital day 6 when he developed A-fib with RVR, for which cardiology was consulted.  #Paroxysmal atrial fibrillation versus flutter with RVR #Severe sepsis 2/2 pneumonia #Acute on chronic respiratory failure #Severe pulmonary hypertension The patient presented to [facility] ED the day following his hemorrhoidectomy that was performed  on 7/28 with shortness of breath, nausea, dry cough, was febrile with marked leukocytosis to 27 on admission and has been treated for pneumonia.  On hospital day 6 he was feeling well and discharge was planned by the primary team when he developed a tachyarrhythmia most consistent with atrial fibs versus flutter with RVR with rates in the 140s.  Upon review of the [facility], he had not received his home metoprolol tartrate 25 mg twice daily in the 5 days he has been in the hospital thus far.  He was given 12.5 mg of p.o. metoprolol x2 with response, HR in the 90s after that.  -Agree with current therapy for treatment of pneumonia -Continuous monitoring on telemetry -S/p 12.5 mg p.o. metoprolol tartrate x2, continue metoprolol tartrate 25 mg twice daily.  25 mg to be dose this afternoon -Continue Eliquis 5 mg twice daily for stroke risk reduction -No further cardiac diagnostics necessary -If the patient continues to feel well, likely okay for discharge from a cardiac standpoint tomorrow morning with follow-up with Dr. Nehemiah Massed in 1 to 2 weeks.  This patient's plan of care was discussed and created with Dr. Clayborn Bigness and he is in agreement.  Signed: Tristan Schroeder , PA-C 02/18/2022, 12:14 PM [facility] Card[facility]

## 2022-02-18 NOTE — Plan of Care (Signed)
  Problem: Skin Integrity: Goal: Risk for impaired skin integrity will decrease Outcome: Progressing   Problem: Fluid Volume: Goal: Hemodynamic stability will improve Outcome: Progressing   Problem: Clinical Measurements: Goal: Diagnostic test results will improve Outcome: Progressing

## 2022-02-19 DIAGNOSIS — A419 Sepsis, unspecified organism: Secondary | ICD-10-CM | POA: Diagnosis not present

## 2022-02-19 DIAGNOSIS — J189 Pneumonia, unspecified organism: Secondary | ICD-10-CM | POA: Diagnosis not present

## 2022-02-19 LAB — CBC
HCT: 29.7 % — ABNORMAL LOW (ref 39.0–52.0)
Hemoglobin: 9.8 g/dL — ABNORMAL LOW (ref 13.0–17.0)
MCH: 31.4 pg (ref 26.0–34.0)
MCHC: 33 g/dL (ref 30.0–36.0)
MCV: 95.2 fL (ref 80.0–100.0)
Platelets: 323 10*3/uL (ref 150–400)
RBC: 3.12 MIL/uL — ABNORMAL LOW (ref 4.22–5.81)
RDW: 15.4 % (ref 11.5–15.5)
WBC: 16.3 10*3/uL — ABNORMAL HIGH (ref 4.0–10.5)
nRBC: 0.3 % — ABNORMAL HIGH (ref 0.0–0.2)

## 2022-02-19 LAB — GLUCOSE, CAPILLARY: Glucose-Capillary: 174 mg/dL — ABNORMAL HIGH (ref 70–99)

## 2022-02-19 MED ORDER — METOPROLOL TARTRATE 25 MG PO TABS: 25.0000 mg | ORAL_TABLET | Freq: Two times a day (BID) | ORAL | 1 refills | Status: DC

## 2022-02-19 MED ORDER — METOPROLOL TARTRATE 5 MG/5ML IV SOLN
5.0000 mg | Freq: Once | INTRAVENOUS | Status: AC
  Administered 2022-02-19: 5 mg via INTRAVENOUS
  Filled 2022-02-19: qty 5

## 2022-02-19 MED ORDER — GUAIFENESIN ER 600 MG PO TB12: 600.0000 mg | ORAL_TABLET | Freq: Two times a day (BID) | ORAL | 0 refills | Status: DC

## 2022-02-19 NOTE — Progress Notes (Signed)
Mobility Specialist - Progress Note    02/19/22 1029  Mobility  Activity Ambulated independently in hallway;Stood at bedside  Level of Assistance Independent after set-up  Assistive Device Front wheel walker  Distance Ambulated (ft) 160 ft  Activity Response Tolerated well  $Mobility charge 1 Mobility   Pt EOB on 3L upon arrival. Pt STS and ambulates 1 lap around NS indep. Pt returns to EOB with needs in reach and wife in room.   Gretchen Short  Mobility Specialist  02/19/22 10:31 AM

## 2022-02-19 NOTE — Discharge Instructions (Signed)
Advised to follow-up with primary care physician in 1 week. Advised to follow-up with Dr. Nehemiah Massed in 1 week for follow-up. Advised to continue metoprolol for A-fib with RVR. Advised to continue Lasix and lisinopril for hypertension. Patient has completed course of antibiotic for aspiration pneumonia. Patient remains on 3 L of supplemental oxygen at baseline.  Continue same

## 2022-02-19 NOTE — Progress Notes (Signed)
Mobility Specialist - Progress Note    02/19/22 1120  Mobility  Activity Ambulated independently in room;Stood at bedside  Level of Assistance Independent after set-up  Assistive Device None  Distance Ambulated (ft) 20 ft  Activity Response Tolerated well  $Mobility charge 1 Mobility    Pt EOB on 3L upon arrival. Pt STS and ambulates in room indep. Pt dons clothes CGA. Pt left on EOB with needs in reach and wife in room.   Gretchen Short  Mobility Specialist  02/19/22 11:23 AM

## 2022-02-19 NOTE — Discharge Summary (Signed)
Physician Discharge Summary  Jay Morris HYI:[id number] DOB: [birth year] DOA: 02/12/2022  PCP: [name], MD  Admit date: 02/12/2022  Discharge date: 02/19/2022  Admitted From: Home.  Disposition:  Home.  Recommendations for Outpatient Follow-up:  Follow up with PCP in 1-2 weeks. Please obtain BMP/CBC in one week Advised to follow-up with Dr. Nehemiah Massed in 1 week for follow-up. Advised to continue metoprolol for A-fib with RVR. Advised to continue Lasix and lisinopril for hypertension. Patient has completed course of antibiotics for aspiration pneumonia. Patient remains on 3 L of supplemental oxygen at baseline  Home Health:None Equipment/Devices:Home oxygen @ 3l/min  Discharge Condition: Stable CODE STATUS:Full code Diet recommendation: Heart Healthy  Brief Summary / Hospital Course: This 76 years old male with PMH significant for BPH, stage IV CKD, COPD, coronary artery disease, diabetes melitis type II, hypertension, GERD, dyslipidemia who underwent hemorrhoidectomy and was discharged home.  During the evening he has experienced dry cough with associated shortness of breath and wheezing.  He was also noted to have fever and chills.  Chest x-ray showed extensive chronic lung disease with fibrosis and increased bilateral patchy airspace opacities suspicious for superimposed acute infectious or inflammatory process. Patient was admitted for severe sepsis secondary to aspiration pneumonia.  Patient was continued on IV antibiotics (IV Unasyn and azithromycin).  Patient has shown significant improvement.  He has completed the course of antibiotics.  He will wean down to baseline oxygen requirement.  Sepsis physiology resolved.  Patient metoprolol was not resumed during hospitalization.  Which because patient went into A-fib with RVR.  Cardiology was consulted.  Patient was resumed on metoprolol dose.  Heart rate remained controlled afterwards.  Patient feels better, wants to be  discharged.  Patient is being discharged home.  He was managed for below problems.  Discharge Diagnoses:  Principal Problem:   Sepsis due to pneumonia 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 Patient has received broad-spectrum antibiotics per sepsis protocol Completed course of IV Unasyn and azithromycin for 6 days. Blood cultures and urine cultures are negative. Procalcitonin 5.50, sepsis physiology has resolved.    Acute on chronic hypoxic respiratory failure: Likely secondary to chronic interstitial lung disease with chronic pulmonary hypertension and chronic home oxygen. Patient regularly follows up with Dr. Raul Del. Continue IV Solu-Medrol, inhalers, bronchodilator nebs--  change to PO steroids from tomorrow if remains stable. Continue home meds and nebulizer for pulmonary hypertension Patient currently on 5 L [place] (normally uses at home) Resumed home medications.  Remains on baseline oxygen requirement now.   Pulmonary hypertension, moderate to severe (HCC) Continue Revatio and Tyvaso.   Diabetes type 2 with CKD stage IV Continue sliding scale insulin. Semglee and Novolog tid with meals. Continue Semglee to 70 units since patient is on steroids.   Recent Hemorrhoidectomy surgery Continue bowel regimen per Dr Lysle Pearl Avoid constipation   Essential Hypertension: Continue  lisinopril and Lasix.   CKD stage IV: Stable.  Baseline creatinine around 2.3.    Paroxysmal atrial  fibrillation: Continue Eliquis. Patient heart rate went into 150s. Patient resumed on metoprolol 25 mg twice daily. Cardiology is consulted.  Heart rate has improved.    Discharge Instructions  Discharge Instructions     Call MD for:  difficulty breathing, headache or visual disturbances   Complete by: As directed    Call MD for:  persistant dizziness or light-headedness   Complete by: As directed    Call MD for:  persistant nausea and vomiting   Complete by: As directed    Diet - low sodium heart healthy   Complete by: As directed    Diet Carb Modified   Complete by: As directed    Discharge instructions   Complete by: As directed    Advised to follow-up with primary care physician in 1 week. Advised to follow-up with Dr. Nehemiah Massed in 1 week for follow-up. Advised to continue metoprolol for A-fib with RVR. Advised to continue Lasix and lisinopril for hypertension. Patient has completed course of antibiotic for aspiration pneumonia. Patient remains on 3 L of supplemental oxygen at baseline.  Continue same   Discharge wound care:   Complete by: As directed    Follow up PCP   Increase activity slowly   Complete by: As directed       Allergies as of 02/19/2022       Reactions   Shrimp [shellfish Allergy] Anaphylaxis   Shrimp Extract Allergy Skin Test         Medication List     TAKE these medications    acetaminophen 500 MG tablet Commonly known as: TYLENOL Take 500 mg by mouth every 6 (six) hours as needed for moderate pain or headache.   albuterol 108 (90 Base) MCG/ACT inhaler Commonly known as: VENTOLIN HFA Inhale 2 puffs into the lungs daily.   allopurinol 100 MG tablet Commonly known as: ZYLOPRIM Take 2 tablets by mouth daily.   ascorbic acid 500 MG tablet Commonly known as: VITAMIN C Take 500 mg by mouth daily.   atorvastatin 20 MG  tablet Commonly known as: LIPITOR Take 20 mg by mouth at bedtime.   Breo Ellipta 100-25 MCG/ACT Aepb Generic drug: fluticasone furoate-vilanterol Inhale 1 puff into the lungs daily.   cyanocobalamin 1000 MCG tablet Commonly known as: VITAMIN B12 Take 1,000 mcg by mouth daily.   Dialyvite Vitamin D 5000 125 MCG (5000 UT) capsule Generic drug: Cholecalciferol Take 5,000 Units by mouth daily.   Eliquis 5 MG Tabs tablet Generic drug: apixaban SMARTSIG:1 Tablet(s) By Mouth Every 12 Hours   finasteride 5 MG tablet Commonly known as: PROSCAR TAKE 1 TABLET BY MOUTH DAILY   furosemide 80 MG tablet Commonly known as: LASIX Take 40 mg by mouth daily.   guaiFENesin 600 MG 12 hr tablet Commonly known as: MUCINEX Take 1 tablet (600 mg total) by mouth 2 (two) times daily.   insulin aspart 100 UNIT/ML FlexPen Commonly known as: NOVOLOG Inject 20-26 Units into the skin 3 (three) times daily with meals. 20 units am 26 units noon, 26 units evening   lidocaine 5 % ointment Commonly known as: XYLOCAINE Apply 1 Application topically 3 (three) times daily as needed. Apply to affected area as needed 3 times daily   lisinopril 20 MG tablet Commonly known as: ZESTRIL Take 10 mg by mouth at bedtime.   magnesium oxide 400 MG tablet Commonly known as: MAG-OX Take 1 tablet by mouth 3 (three) times daily.   metoprolol tartrate 25 MG tablet Commonly known as: LOPRESSOR Take 1  tablet (25 mg total) by mouth 2 (two) times daily.   nystatin-triamcinolone ointment Commonly known as: MYCOLOG Apply 1 Application topically 2 (two) times daily. Do not use for longer than 4-6 weeks.   oxyCODONE 5 MG immediate release tablet Commonly known as: Roxicodone Take 1 tablet (5 mg total) by mouth every 8 (eight) hours as needed.   pantoprazole 40 MG tablet Commonly known as: PROTONIX Take 1 tablet (40 mg total) by mouth 2 (two) times daily before a meal.   sildenafil 20 MG tablet Commonly known as:  REVATIO Take 20 mg by mouth 3 (three) times daily.   Tyler Aas FlexTouch 200 UNIT/ML FlexTouch Pen Generic drug: insulin degludec Inject 60 Units into the skin in the morning and at bedtime.   Tyvaso Refill 0.6 MG/ML Soln Generic drug: Treprostinil Inhale 18 mcg into the lungs in the morning, at noon, in the evening, and at bedtime.               Discharge Care Instructions  (From admission, onward)           Start     Ordered   02/19/22 0000  Discharge wound care:       Comments: Follow up PCP   02/19/22 1047            Follow-up Information     [name], MD Follow up in 1 week(s).   Specialty: Internal Medicine Contact information: [place] [address] [phone]         Corey Skains, MD Follow up in 1 week(s).   Specialty: Cardiology Contact information: [address] [place] [address] [phone]                Allergies  Allergen Reactions   Shrimp [Shellfish Allergy] Anaphylaxis   Shrimp Extract Allergy Skin Test     Consultations: Cardiology   Procedures/Studies: [facility] Chest Port 1 View  Result Date: 02/12/2022 CLINICAL DATA:  Possible sepsis EXAM: PORTABLE CHEST 1 VIEW COMPARISON:  CT 10/03/2019, chest x-ray 08/03/2021, 06/23/2019, 01/04/2016 FINDINGS: Diffuse bilateral coarse reticular interstitial opacity consistent with fibrosis and chronic lung disease. Increased bilateral patchy opacities compared to prior suggesting acute superimposed infectious or inflammatory process. Stable cardiomediastinal silhouette with aortic atherosclerosis. No pneumothorax IMPRESSION: Extensive chronic lung disease and fibrosis. Increased bilateral patchy airspace opacities suspicious for superimposed acute infectious or inflammatory process Electronically Signed   By: Donavan Foil M.D.   On: 02/12/2022 22:46      Subjective: Patient was seen and  examined at bedside.  Overnight events noted.   Patient reports feeling better.  Patient being discharged home,  heart rate remains controlled.  Discharge Exam: Vitals:   02/19/22 0726 02/19/22 0746  BP: (!) 155/74   Pulse: 75   Resp:    Temp: 98 F (36.7 C)   SpO2: 96% 93%   Vitals:   02/18/22 2337 02/19/22 0353 02/19/22 0726 02/19/22 0746  BP: (!) 114/99 121/62 (!) 155/74   Pulse: (!) 42 74 75   Resp: 19 20    Temp: 97.8 F (36.6 C) 97.8 F (36.6 C) 98 F (36.7 C)   TempSrc: Oral Oral    SpO2: 97% 95% 96% 93%  Weight:      Height:        General: Pt is alert, awake, not in acute distress Cardiovascular: RRR, S1/S2 +, no rubs, no gallops Respiratory: CTA bilaterally, no wheezing, no rhonchi  Abdominal: Soft, NT, ND, bowel sounds + Extremities: no edema, no cyanosis    The results of significant diagnostics from this hospitalization (including imaging, microbiology, ancillary and laboratory) are listed below for reference.     Microbiology: Recent Results (from the past 240 hour(s))  Blood Culture (routine x 2)     Status: None   Collection Time: 02/12/22 10:09 PM   Specimen: BLOOD  Result Value Ref Range Status   Specimen Description BLOOD LEFT ARM  Final   Special Requests   Final    BOTTLES DRAWN AEROBIC AND ANAEROBIC Blood Culture adequate volume   Culture   Final    NO GROWTH 5 DAYS Performed at [facility], [address], [place], [place] [address]    Report Status 02/17/2022 FINAL  Final  Blood Culture (routine x 2)     Status: None   Collection Time: 02/12/22 10:09 PM   Specimen: BLOOD  Result Value Ref Range Status   Specimen Description BLOOD LEFT HAND  Final   Special Requests   Final    BOTTLES DRAWN AEROBIC AND ANAEROBIC Blood Culture adequate volume   Culture   Final    NO GROWTH 5 DAYS Performed at [facility], [address] [place], [place] [address]    Report Status 02/17/2022 FINAL  Final  Urine Culture      Status: None   Collection Time: 02/12/22 10:09 PM   Specimen: In/Out Cath Urine  Result Value Ref Range Status   Specimen Description   Final    IN/OUT CATH URINE Performed at [facility], [address], [place], [place] [address]    Special Requests   Final    NONE Performed at [facility], [address], [place], [place] [address]    Culture   Final    NO GROWTH Performed at [facility], [place] [address] [place], [place] [address]    Report Status 02/14/2022 FINAL  Final  SARS Coronavirus 2 by RT PCR (hospital order, performed in [facility] hospital lab) *cepheid single result test* Anterior Nasal Swab     Status: None   Collection Time: 02/12/22 10:28 PM   Specimen: Anterior Nasal Swab  Result Value Ref Range Status   SARS Coronavirus 2 by RT PCR NEGATIVE NEGATIVE Final    Comment: (NOTE) SARS-CoV-2 target nucleic acids are NOT DETECTED.  The SARS-CoV-2 RNA is generally detectable in upper and lower respiratory specimens during the acute phase of infection. The lowest concentration of SARS-CoV-2 viral copies this assay can detect is 250 copies / mL. A negative result does not preclude SARS-CoV-2 infection and should not be used as the sole basis for treatment or other patient management decisions.  A negative result may occur with improper specimen collection / handling, submission of specimen other than nasopharyngeal swab, presence of viral mutation(s) within the areas targeted by this assay, and inadequate number of viral copies (<250 copies / mL). A negative result must be combined with clinical observations, patient history, and epidemiological information.  Fact Sheet for Patients:   https://www.patel.info/  Fact Sheet for Healthcare Providers: https://hall.com/  This test is not yet approved or  cleared by the [place] FDA and has been authorized for detection and/or diagnosis of  SARS-CoV-2 by FDA under an Emergency Use Authorization (EUA).  This EUA will remain in effect (meaning this test can be used) for the duration of the COVID-19 declaration under Section 564(b)(1) of the Act, 21 U.S.C.  section 360bbb-3(b)(1), unless the authorization is terminated or revoked sooner.  Performed at [facility], [address] [place], [place] [address]      Labs: BNP (last 3 results) Recent Labs    08/03/21 0831 09/02/21 1503 02/15/22 0608  BNP 63.5 154.9* 563.8*   Basic Metabolic Panel: Recent Labs  Lab 02/12/22 2209 02/13/22 0639 02/15/22 0608 02/17/22 0435 02/18/22 0459  NA 140 138 138 137 135  K 3.9 4.7 4.6 4.2 4.6  CL 105 106 104 97* 100  CO2 _0 GLUCOSE 103* 140* 229* 199* 182*  BUN 26* 26* 37* 50* 62*  CREATININE 2.19* 2.33* 2.25* 2.24* 2.22*  CALCIUM 8.6* 8.1* 8.9 9.2 8.8*  MG  --   --   --   --  2.3  PHOS  --   --   --   --  4.9*   Liver Function Tests: Recent Labs  Lab 02/12/22 2209  AST 23  ALT 21  ALKPHOS 34*  BILITOT 1.1  PROT 6.8  ALBUMIN 3.9   No results for input(s): "LIPASE", "AMYLASE" in the last 168 hours. No results for input(s): "AMMONIA" in the last 168 hours. CBC: Recent Labs  Lab 02/12/22 2209 02/13/22 0639 02/15/22 0608 02/18/22 0459 02/19/22 0545  WBC 19.4* 27.4* 15.1* 12.9* 16.3*  NEUTROABS 17.5*  --   --   --   --   HGB 9.6* 8.5* 8.1* 9.5* 9.8*  HCT 30.7* 26.8* 25.4* 29.4* 29.7*  MCV 100.0 100.0 97.3 95.1 95.2  PLT 229 218 224 301 323   Cardiac Enzymes: No results for input(s): "CKTOTAL", "CKMB", "CKMBINDEX", "TROPONINI" in the last 168 hours. BNP: Invalid input(s): "POCBNP" CBG: Recent Labs  Lab 02/18/22 1140 02/18/22 1607 02/18/22 2124 02/18/22 2207 02/19/22 0737  GLUCAP 332* 171* 101* 120* 174*   D-Dimer No results for input(s): "DDIMER" in the last 72 hours. Hgb A1c No results for input(s): "HGBA1C" in the last 72 hours. Lipid Profile No results for input(s):  "CHOL", "HDL", "LDLCALC", "TRIG", "CHOLHDL", "LDLDIRECT" in the last 72 hours. Thyroid function studies No results for input(s): "TSH", "T4TOTAL", "T3FREE", "THYROIDAB" in the last 72 hours.  Invalid input(s): "FREET3" Anemia work up No results for input(s): "VITAMINB12", "FOLATE", "FERRITIN", "TIBC", "IRON", "RETICCTPCT" in the last 72 hours. Urinalysis    Component Value Date/Time   COLORURINE STRAW (A) 02/12/2022 2209   APPEARANCEUR CLEAR (A) 02/12/2022 2209   LABSPEC 1.009 02/12/2022 2209   PHURINE 7.0 02/12/2022 2209   GLUCOSEU NEGATIVE 02/12/2022 2209   HGBUR SMALL (A) 02/12/2022 2209   BILIRUBINUR NEGATIVE 02/12/2022 2209   KETONESUR NEGATIVE 02/12/2022 2209   PROTEINUR 100 (A) 02/12/2022 2209   NITRITE NEGATIVE 02/12/2022 2209   LEUKOCYTESUR NEGATIVE 02/12/2022 2209   Sepsis Labs Recent Labs  Lab 02/13/22 0639 02/15/22 0608 02/18/22 0459 02/19/22 0545  WBC 27.4* 15.1* 12.9* 16.3*   Microbiology Recent Results (from the past 240 hour(s))  Blood Culture (routine x 2)     Status: None   Collection Time: 02/12/22 10:09 PM   Specimen: BLOOD  Result Value Ref Range Status   Specimen Description BLOOD LEFT ARM  Final   Special Requests   Final    BOTTLES DRAWN AEROBIC AND ANAEROBIC Blood Culture adequate volume   Culture   Final    NO GROWTH 5 DAYS Performed at [facility], [address], [place], [place] [address]    Report Status 02/17/2022 FINAL  Final  Blood Culture (routine x  2)     Status: None   Collection Time: 02/12/22 10:09 PM   Specimen: BLOOD  Result Value Ref Range Status   Specimen Description BLOOD LEFT HAND  Final   Special Requests   Final    BOTTLES DRAWN AEROBIC AND ANAEROBIC Blood Culture adequate volume   Culture   Final    NO GROWTH 5 DAYS Performed at [facility], [address] [place], [place] [address]    Report Status 02/17/2022 FINAL  Final  Urine Culture     Status: None   Collection Time: 02/12/22 10:09  PM   Specimen: In/Out Cath Urine  Result Value Ref Range Status   Specimen Description   Final    IN/OUT CATH URINE Performed at [facility], [address], [place], [place] [address]    Special Requests   Final    NONE Performed at [facility], [address] [place], [place] [address]    Culture   Final    NO GROWTH Performed at [facility], [place] [address] [place], [place] [address]    Report Status 02/14/2022 FINAL  Final  SARS Coronavirus 2 by RT PCR (hospital order, performed in [facility] hospital lab) *cepheid single result test* Anterior Nasal Swab     Status: None   Collection Time: 02/12/22 10:28 PM   Specimen: Anterior Nasal Swab  Result Value Ref Range Status   SARS Coronavirus 2 by RT PCR NEGATIVE NEGATIVE Final    Comment: (NOTE) SARS-CoV-2 target nucleic acids are NOT DETECTED.  The SARS-CoV-2 RNA is generally detectable in upper and lower respiratory specimens during the acute phase of infection. The lowest concentration of SARS-CoV-2 viral copies this assay can detect is 250 copies / mL. A negative result does not preclude SARS-CoV-2 infection and should not be used as the sole basis for treatment or other patient management decisions.  A negative result may occur with improper specimen collection / handling, submission of specimen other than nasopharyngeal swab, presence of viral mutation(s) within the areas targeted by this assay, and inadequate number of viral copies (<250 copies / mL). A negative result must be combined with clinical observations, patient history, and epidemiological information.  Fact Sheet for Patients:   https://www.patel.info/  Fact Sheet for Healthcare Providers: https://hall.com/  This test is not yet approved or  cleared by the [place] FDA and has been authorized for detection and/or diagnosis of SARS-CoV-2 by FDA under an Emergency Use  Authorization (EUA).  This EUA will remain in effect (meaning this test can be used) for the duration of the COVID-19 declaration under Section 564(b)(1) of the Act, 21 U.S.C. section 360bbb-3(b)(1), unless the authorization is terminated or revoked sooner.  Performed at [facility], [address] [place], [place] [address]      Time coordinating discharge: Over 30 minutes  SIGNED:   Shawna Clamp, MD  [facility] 02/19/2022, 10:48 AM Pager   If 7PM-7AM, please contact night-coverage

## 2022-02-19 NOTE — Plan of Care (Signed)
Problem: Education: Goal: Knowledge of General Education information will improve Description: Including pain rating scale, medication(s)/side effects and non-pharmacologic comfort measures 02/19/2022 1133 by Ardelia Mems, RN Outcome: Completed/Met 02/19/2022 1132 by Ardelia Mems, RN Outcome: Adequate for Discharge   Problem: Health Behavior/Discharge Planning: Goal: Ability to manage health-related needs will improve 02/19/2022 1133 by Ardelia Mems, RN Outcome: Completed/Met 02/19/2022 1132 by Ardelia Mems, RN Outcome: Adequate for Discharge   Problem: Clinical Measurements: Goal: Ability to maintain clinical measurements within normal limits will improve 02/19/2022 1133 by Ardelia Mems, RN Outcome: Completed/Met 02/19/2022 1132 by Ardelia Mems, RN Outcome: Adequate for Discharge Goal: Will remain free from infection 02/19/2022 1133 by Ardelia Mems, RN Outcome: Completed/Met 02/19/2022 1132 by Ardelia Mems, RN Outcome: Adequate for Discharge Goal: Diagnostic test results will improve 02/19/2022 1133 by Ardelia Mems, RN Outcome: Completed/Met 02/19/2022 1132 by Ardelia Mems, RN Outcome: Adequate for Discharge Goal: Respiratory complications will improve 02/19/2022 1133 by Ardelia Mems, RN Outcome: Completed/Met 02/19/2022 1132 by Ardelia Mems, RN Outcome: Adequate for Discharge Goal: Cardiovascular complication will be avoided 02/19/2022 1133 by Ardelia Mems, RN Outcome: Completed/Met 02/19/2022 1132 by Ardelia Mems, RN Outcome: Adequate for Discharge   Problem: Activity: Goal: Risk for activity intolerance will decrease 02/19/2022 1133 by Ardelia Mems, RN Outcome: Completed/Met 02/19/2022 1132 by Ardelia Mems, RN Outcome: Adequate for Discharge   Problem: Nutrition: Goal: Adequate nutrition will be maintained 02/19/2022 1133 by Ardelia Mems, RN Outcome: Completed/Met 02/19/2022  1132 by Ardelia Mems, RN Outcome: Adequate for Discharge   Problem: Coping: Goal: Level of anxiety will decrease 02/19/2022 1133 by Ardelia Mems, RN Outcome: Completed/Met 02/19/2022 1132 by Ardelia Mems, RN Outcome: Adequate for Discharge   Problem: Elimination: Goal: Will not experience complications related to bowel motility 02/19/2022 1133 by Ardelia Mems, RN Outcome: Completed/Met 02/19/2022 1132 by Ardelia Mems, RN Outcome: Adequate for Discharge Goal: Will not experience complications related to urinary retention 02/19/2022 1133 by Ardelia Mems, RN Outcome: Completed/Met 02/19/2022 1132 by Ardelia Mems, RN Outcome: Adequate for Discharge   Problem: Pain Managment: Goal: General experience of comfort will improve 02/19/2022 1133 by Ardelia Mems, RN Outcome: Completed/Met 02/19/2022 1132 by Ardelia Mems, RN Outcome: Adequate for Discharge   Problem: Safety: Goal: Ability to remain free from injury will improve 02/19/2022 1133 by Ardelia Mems, RN Outcome: Completed/Met 02/19/2022 1132 by Ardelia Mems, RN Outcome: Adequate for Discharge   Problem: Skin Integrity: Goal: Risk for impaired skin integrity will decrease 02/19/2022 1133 by Ardelia Mems, RN Outcome: Completed/Met 02/19/2022 1132 by Ardelia Mems, RN Outcome: Adequate for Discharge   Problem: Fluid Volume: Goal: Hemodynamic stability will improve 02/19/2022 1133 by Ardelia Mems, RN Outcome: Completed/Met 02/19/2022 1132 by Ardelia Mems, RN Outcome: Adequate for Discharge   Problem: Clinical Measurements: Goal: Diagnostic test results will improve 02/19/2022 1133 by Ardelia Mems, RN Outcome: Completed/Met 02/19/2022 1132 by Ardelia Mems, RN Outcome: Adequate for Discharge Goal: Signs and symptoms of infection will decrease 02/19/2022 1133 by Ardelia Mems, RN Outcome: Completed/Met 02/19/2022 1132 by Ardelia Mems, RN Outcome: Adequate for Discharge   Problem: Respiratory: Goal: Ability to maintain adequate ventilation will improve 02/19/2022 1133 by Ardelia Mems, RN Outcome: Completed/Met 02/19/2022 1132 by Ardelia Mems, RN Outcome: Adequate for Discharge   Problem: Education: Goal: Ability to describe self-care measures that may prevent or decrease complications (Diabetes Survival Skills Education) will improve 02/19/2022 1133 by Ardelia Mems, RN Outcome: Completed/Met 02/19/2022 1132 by Ardelia Mems, RN Outcome: Adequate for Discharge Goal: Individualized Educational Video(s) 02/19/2022 1133 by Ardelia Mems, RN  Outcome: Completed/Met 02/19/2022 1132 by Ardelia Mems, RN Outcome: Adequate for Discharge   Problem: Coping: Goal: Ability to adjust to condition or change in health will improve 02/19/2022 1133 by Ardelia Mems, RN Outcome: Completed/Met 02/19/2022 1132 by Ardelia Mems, RN Outcome: Adequate for Discharge   Problem: Fluid Volume: Goal: Ability to maintain a balanced intake and output will improve 02/19/2022 1133 by Ardelia Mems, RN Outcome: Completed/Met 02/19/2022 1132 by Ardelia Mems, RN Outcome: Adequate for Discharge   Problem: Health Behavior/Discharge Planning: Goal: Ability to identify and utilize available resources and services will improve 02/19/2022 1133 by Ardelia Mems, RN Outcome: Completed/Met 02/19/2022 1132 by Ardelia Mems, RN Outcome: Adequate for Discharge Goal: Ability to manage health-related needs will improve 02/19/2022 1133 by Ardelia Mems, RN Outcome: Completed/Met 02/19/2022 1132 by Ardelia Mems, RN Outcome: Adequate for Discharge   Problem: Metabolic: Goal: Ability to maintain appropriate glucose levels will improve 02/19/2022 1133 by Ardelia Mems, RN Outcome: Completed/Met 02/19/2022 1132 by Ardelia Mems, RN Outcome: Adequate for Discharge    Problem: Nutritional: Goal: Maintenance of adequate nutrition will improve 02/19/2022 1133 by Ardelia Mems, RN Outcome: Completed/Met 02/19/2022 1132 by Ardelia Mems, RN Outcome: Adequate for Discharge Goal: Progress toward achieving an optimal weight will improve 02/19/2022 1133 by Ardelia Mems, RN Outcome: Completed/Met 02/19/2022 1132 by Ardelia Mems, RN Outcome: Adequate for Discharge   Problem: Skin Integrity: Goal: Risk for impaired skin integrity will decrease 02/19/2022 1133 by Ardelia Mems, RN Outcome: Completed/Met 02/19/2022 1132 by Ardelia Mems, RN Outcome: Adequate for Discharge   Problem: Tissue Perfusion: Goal: Adequacy of tissue perfusion will improve 02/19/2022 1133 by Ardelia Mems, RN Outcome: Completed/Met 02/19/2022 1132 by Ardelia Mems, RN Outcome: Adequate for Discharge

## 2022-02-19 NOTE — Plan of Care (Signed)

## 2022-02-19 NOTE — Progress Notes (Addendum)
Patient was seen evaluated and examined by me and the PA on 02/19/22.  Course of action, evaluation, and management decisions were developed solely by me, but detailed below in the PA's note.  [place] NOTE       Patient ID: Jay Morris MRN: [id number] DOB/AGE: 01-15-1946 76 y.o.  Admit date: 02/12/2022 Referring Physician Dr. Shawna Clamp Primary Physician Dr. Doy Hutching Primary Cardiologist Dr. Nehemiah Massed  Reason for Consultation AF RVR  HPI: Jay Morris is a [phone] with a PMH of paroxysmal atrial fibrillation on Eliquis, severe pulmonary hypertension, chronic respiratory failure on 3 L at baseline, CKD 4, type 2 diabetes, hypertension, hyperlipidemia, s/p hemorrhoidectomy 02/12/2022 who presented to [facility] ED the day following his procedure with nausea and dry heaving in addition to dry cough and shortness of breath.  He was febrile and chest x-ray showed bilateral infiltrates concerning for pneumonia.  He was treated with broad-spectrum antibiotics and was doing well, awaiting discharge on hospital day 6 when he developed A-fib with RVR, for which cardiology was consulted.  Interval History: - did not receive nighttime dose of metoprolol because HR by pulse ox was measured at 50, although it appears on telemetry his heart rate was in the 90s to low 100s overnight -Back in a flutter with RVR this morning with rates in the 140s to 150s, despite this the patient feels well, denies palpitations, chest pain, shortness of breath and remains on his baseline oxygen.  Review of systems complete and found to be negative unless listed above     Past Medical History:  Diagnosis Date   (HFpEF) heart failure with preserved ejection fraction ([place])    a.) TTE 04/01/2016: EF 60-65%, LAE, MAC, mild MR/AR/TR, mild-mod PR, G2DD; b.) TTE 12/18/2018: EF 55%, mild LVH, mild AR/MR/TR. mod PR; c.) TTE 05/31/2019: EF 60-65%, mild LVH, RAE/RVE, triv AR.MT, PR, mild TR; d.) TTE 10/26/2019: EF  55%, BAE, RVE, triv AR, mild MR/PR, sev TR, G2DD; e.) R/LHC 01/17/2020: mPA 52, PCWP 25, LVEDP 19, RVEDP 25, mRA 13   Anemia    Aortic atherosclerosis (HCC)    Asthma    BPH (benign prostatic hyperplasia)    CKD (chronic kidney disease), stage III (HCC)    COPD (chronic obstructive pulmonary disease) ([place])    Coronary artery disease 01/17/2020   a.) LHC 01/17/2020: 25% oLAD, 65% o-pRCA, 45% pLAD, 65% mLAD, 35% OM2 --> med mgmt.   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   PAF (paroxysmal atrial fibrillation) (HCC)    a.) CHA2DS2-VASc = 6 (age x 2, HFpEF, HTN, vascular disease, T2DM);  b.) cardiac ablation in approx 2014-2015 at [facility]; c.) A.fib recurrence in setting of SARS-CoV-2 --> Tx'd with IV amiodarone + metoprolol + DCCV x 1 (200 J) 06/22/2019 restoring NSR; d.) rate/rhythm maintained without pharmacological intervention; chronically anticoagulated using standard dose apixaban   Pneumonia due to COVID-19 virus 06/02/2019   a.) hospitalized at [facility]) from 06/02/2019 - 06/25/2019; Tx'd with steriods + remdesivir + convalescent plasma   Pulmonary fibrosis (HCC)    Pulmonary HTN (  [place]) 04/01/2016   a.) TTE 04/01/2016: EF 60-65%, RVSP 40; b.) TTE 12/18/2018: EF 55%, RVSP 45.9; c.) TTE 05/31/2019: EF 60-65%, RVSP 35.5; d.) TTE 10/26/2019: EF 55%, RVSP 100.4; e.) R/LHC 01/17/2020: mPA 52, PCWP 25, LVEDP 19, RVEDP 25, mRA 13; f.) Tx with PDE5i (sildenafil) + vasodilator (treprostinil)   Type 2 diabetes mellitus treated with insulin ([place])     Past Surgical History:  Procedure Laterality Date   BACK  SURGERY     x3 L3-L4    CARDIAC ELECTROPHYSIOLOGY MAPPING AND ABLATION N/A    performed at [place] in approximately 2014-2015   CARDIOVERSION N/A 06/22/2019   Procedure: CARDIOVERSION;  Surgeon: Fay Records, MD;  Location: [facility];  Service: Cardiovascular;  Laterality: N/A;   COLONOSCOPY WITH PROPOFOL N/A 06/15/2017   Procedure: COLONOSCOPY WITH PROPOFOL;  Surgeon: Toledo, Benay Pike, MD;  Location: [facility] ENDOSCOPY;  Service: Gastroenterology;  Laterality: N/A;   ENTEROSCOPY N/A 11/24/2017   Procedure: ENTEROSCOPY;  Surgeon: Jonathon Bellows, MD;  Location: [facility] ENDOSCOPY;  Service: Gastroenterology;  Laterality: N/A;   ESOPHAGOGASTRODUODENOSCOPY N/A 11/27/2017   Procedure: ESOPHAGOGASTRODUODENOSCOPY (EGD);  Surgeon: Lucilla Lame, MD;  Location: [facility] ENDOSCOPY;  Service: Endoscopy;  Laterality: N/A;   ESOPHAGOGASTRODUODENOSCOPY (EGD) WITH PROPOFOL N/A 06/02/2017   Procedure: ESOPHAGOGASTRODUODENOSCOPY (EGD) WITH PROPOFOL;  Surgeon: Toledo, Benay Pike, MD;  Location: [facility] ENDOSCOPY;  Service: Gastroenterology;  Laterality: N/A;   ESOPHAGOGASTRODUODENOSCOPY (EGD) WITH PROPOFOL N/A 08/04/2021   Procedure: ESOPHAGOGASTRODUODENOSCOPY (EGD) WITH PROPOFOL;  Surgeon: Lesly Rubenstein, MD;  Location: [facility] ENDOSCOPY;  Service: Endoscopy;  Laterality: N/A;   EVALUATION UNDER ANESTHESIA WITH HEMORRHOIDECTOMY N/A 02/12/2022   Procedure: EXAM UNDER ANESTHESIA WITH HEMORRHOIDECTOMY;  Surgeon: Benjamine Sprague, DO;  Location: [facility] ORS;  Service: General;  Laterality: N/A;   GIVENS CAPSULE STUDY N/A 11/25/2017   Procedure: GIVENS CAPSULE STUDY;  Surgeon: Jonathon Bellows, MD;  Location: [facility] ENDOSCOPY;  Service: Gastroenterology;  Laterality: N/A;   HAND SURGERY     RIGHT/LEFT HEART CATH AND CORONARY ANGIOGRAPHY N/A 01/17/2020   Procedure: RIGHT/LEFT HEART CATH AND CORONARY ANGIOGRAPHY;  Surgeon: Corey Skains, MD;  Location: [facility] LAB;  Service: Cardiovascular;  Laterality: N/A;    Medications Prior to  Admission  Medication Sig Dispense Refill Last Dose   acetaminophen (TYLENOL) 500 MG tablet Take 500 mg by mouth every 6 (six) hours as needed for moderate pain or headache. 30 tablet 0 prn at prn   albuterol (VENTOLIN HFA) 108 (90 Base) MCG/ACT inhaler Inhale 2 puffs into the lungs daily.   02/12/2022   allopurinol (ZYLOPRIM) 100 MG tablet Take 2 tablets by mouth daily.   02/12/2022   ascorbic acid (VITAMIN C) 500 MG tablet Take 500 mg by mouth daily.   Past Week   atorvastatin (LIPITOR) 20 MG tablet Take 20 mg by mouth at bedtime.    02/11/2022   BREO ELLIPTA 100-25 MCG/INH AEPB Inhale 1 puff into the lungs daily.   02/12/2022   Cholecalciferol (DIALYVITE VITAMIN D 5000) 125 MCG (5000 UT) capsule Take 5,000 Units by mouth daily.   Past Week   ELIQUIS 5 MG TABS tablet SMARTSIG:1 Tablet(s) By Mouth Every 12 Hours   Past Week   finasteride (PROSCAR) 5 MG tablet TAKE 1 TABLET BY MOUTH DAILY 30 tablet 11 02/12/2022   furosemide (LASIX) 80 MG tablet Take 40 mg by mouth daily.   02/12/2022   insulin aspart (NOVOLOG) 100 UNIT/ML FlexPen Inject 20-26 Units into the skin 3 (three) times daily with meals. 20 units  am 26 units noon, 26 units evening   02/12/2022   insulin degludec (TRESIBA FLEXTOUCH) 200 UNIT/ML FlexTouch Pen Inject 60 Units into the skin in the morning and at bedtime.   02/12/2022   lisinopril (ZESTRIL) 20 MG tablet Take 10 mg by mouth at bedtime.    02/11/2022   magnesium oxide (MAG-OX) 400 MG tablet Take 1 tablet by mouth 3 (three) times daily.   02/12/2022   nystatin-triamcinolone ointment (MYCOLOG) Apply 1 Application topically 2 (two) times daily. Do not use for longer than 4-6 weeks. 30 g 1 Past Week   oxyCODONE (ROXICODONE) 5 MG immediate release tablet Take 1 tablet (5 mg total) by mouth every 8 (eight) hours as needed. 15 tablet 0 prn at prn   pantoprazole (PROTONIX) 40 MG tablet Take 1 tablet (40 mg total) by mouth 2 (two) times daily before a meal. 60 tablet 0 02/12/2022   sildenafil  (REVATIO) 20 MG tablet Take 20 mg by mouth 3 (three) times daily.   02/12/2022   TYVASO REFILL 0.6 MG/ML SOLN Inhale 18 mcg into the lungs in the morning, at noon, in the evening, and at bedtime.   02/12/2022   vitamin B-12 (CYANOCOBALAMIN) 1000 MCG tablet Take 1,000 mcg by mouth daily.   Past Week   lidocaine (XYLOCAINE) 5 % ointment Apply 1 Application topically 3 (three) times daily as needed. Apply to affected area as needed 3 times daily 35.44 g 0 prn at prn   Social History   Socioeconomic History   Marital status: Married    Spouse name: Jay Morris    Number of children: 2   Years of education: Not on file   Highest education level: Not on file  Occupational History   Occupation: retired     Comment: [employer]   Tobacco Use   Smoking status: Former    Packs/day: 1.00    Years: 30.00    Total pack years: 30.00    Types: Cigarettes    Quit date: 1995    Years since quitting: 28.6   Smokeless tobacco: Never  Vaping Use   Vaping Use: Never used  Substance and Sexual Activity   Alcohol use: No    Alcohol/week: 0.0 standard drinks of alcohol    Comment: occasional   Drug use: No   Sexual activity: Yes  Other Topics Concern   Not on file  Social History Narrative   Lives at home with wife    Social Determinants of Health   Financial Resource Strain: Not on file  Food Insecurity: Not on file  Transportation Needs: Not on file  Physical Activity: Not on file  Stress: Not on file  Social Connections: Not on file  Intimate Partner Violence: Not on file    Family History  Problem Relation Age of Onset   Heart disease Mother    Cancer Father    Cancer Paternal Grandfather       PHYSICAL EXAM General: Pleasant elderly and conversational Caucasian male sitting upright with legs off his hospital bed eating breakfast HEENT:  Normocephalic and atraumatic. Neck:  No JVD.  Lungs: Normal respiratory effort on his baseline 3 L, speaking in complete sentences without dyspnea.   Very trace expiratory wheezes on the left, clear to auscultation otherwise Heart: Irregular irregular rhythm with controlled rate. Normal S1 and S2 without gallops or murmurs.  Abdomen: Obese appearing.  Msk: Normal strength and tone for age. Extremities: Warm and well perfused. No clubbing, cyanosis.  No significant peripheral edema.  Neuro: Alert and oriented X 3. Psych:  Answers questions appropriately.   Labs:   Lab Results  Component Value Date   WBC 16.3 (H) 02/19/2022   HGB 9.8 (L) 02/19/2022   HCT 29.7 (L) 02/19/2022   MCV 95.2 02/19/2022   PLT 323 02/19/2022    Recent Labs  Lab 02/12/22 2209 02/13/22 0639 02/18/22 0459  NA 140   < > 135  K 3.9   < > 4.6  CL 105   < > 100  CO2 25   < > 25  BUN 26*   < > 62*  CREATININE 2.19*   < > 2.22*  CALCIUM 8.6*   < > 8.8*  PROT 6.8  --   --   BILITOT 1.1  --   --   ALKPHOS 34*  --   --   ALT 21  --   --   AST 23  --   --   GLUCOSE 103*   < > 182*   < > = values in this interval not displayed.    Lab Results  Component Value Date   TROPONINI <0.03 12/02/2017   No results found for: "CHOL" No results found for: "HDL" No results found for: "[place]" No results found for: "TRIG" No results found for: "CHOLHDL" No results found for: "LDLDIRECT"    Radiology: [facility] Chest Port 1 View  Result Date: 02/12/2022 CLINICAL DATA:  Possible sepsis EXAM: PORTABLE CHEST 1 VIEW COMPARISON:  CT 10/03/2019, chest x-ray 08/03/2021, 06/23/2019, 01/04/2016 FINDINGS: Diffuse bilateral coarse reticular interstitial opacity consistent with fibrosis and chronic lung disease. Increased bilateral patchy opacities compared to prior suggesting acute superimposed infectious or inflammatory process. Stable cardiomediastinal silhouette with aortic atherosclerosis. No pneumothorax IMPRESSION: Extensive chronic lung disease and fibrosis. Increased bilateral patchy airspace opacities suspicious for superimposed acute infectious or inflammatory process  Electronically Signed   By: Donavan Foil M.D.   On: 02/12/2022 22:46    ECHO 10/26/2019                         CARDIOLOGY DEPARTMENT                [place], [place] #: [id number]            [address] [place], [place] [address]       Date: 10/26/2019 10: 06 AM                                                               Adult   Male   Age: 52 yrs            ECHOCARDIOGRAM REPORT  Outpatient                                                               [facility]       STUDY:CHEST WALL               TAPE:0000: 00: 0: 00: 00 MD1: Nehemiah Massed, MD Bruce        ECHO:Yes    DOPPLER:Yes       FILE:[id number]        BP: 121/52 mmHg       COLOR:Yes   CONTRAST:No     MACHINE:[place]   RV BIOPSY:No          3D:No  SOUND QLTY:Moderate            Height: 68 in      MEDIUM:None                                              Weight: 192 lb                                                               BSA: 2.0 m2  _________________________________________________________________________________________                HISTORY: Recent A.Fib                 REASON: Assess, LV function             INDICATION: I48.0 Paroxysmal A-fib, R06.02 Shortness of breath  _________________________________________________________________________________________  ECHOCARDIOGRAPHIC MEASUREMENTS  2D DIMENSIONS  AORTA                  Values   Normal Range   MAIN PA         Values    Normal Range                Annulus: nm*          [2.3-2.9]         PA Main: nm*       [1.5-2.1]              Aorta Sin: 3.4 cm       [3.1-3.7]    RIGHT VENTRICLE            ST Junction: nm*          [2.6-3.2]         RV Base: 4.2 cm    [< 4.2]              Asc.Aorta: nm*          [2.6-3.4]          RV Mid: nm*       [<3.5]  LEFT VENTRICLE                                       RV  Length: nm*       [<8.6]                  LVIDd: 4.7 cm       [4.2-5.9]    INFERIOR VENA CAVA                  LVIDs: 3.4 cm                        Max. IVC: nm*       [<=2.1]                     FS: 28.7 %       [>25]            Min. IVC: nm*                    SWT: 1.2 cm       [0.6-1.0]    ------------------                    PWT: 1.1 cm       [0.6-1.0]    nm* - not measured  LEFT ATRIUM                LA Diam: 4.4 cm       [3.0-4.0]            LA A4C Area: nm*          [<20]              LA Volume: nm*          [18-58]  _________________________________________________________________________________________  ECHOCARDIOGRAPHIC DESCRIPTIONS  AORTIC ROOT                   Size: Normal             Dissection: INDETERM FOR DISSECTION  AORTIC VALVE               Leaflets: Tricuspid                   Morphology: Normal               Mobility: Fully mobile  LEFT VENTRICLE                   Size: Normal                        Anterior: Normal            Contraction: Normal                         Lateral: Normal             Closest EF: >55% (Estimated)                Septal: Normal              LV Masses: No Masses                       Apical: Normal                    LVH: None                          Inferior: Normal  Posterior: Normal           Dias.FxClass: (Grade 2) relaxation abnormal, pseudonormal  MITRAL VALVE               Leaflets: Normal                        Mobility: Fully mobile             Morphology: Normal  LEFT ATRIUM                   Size: MILDLY ENLARGED              LA Masses: No masses              IA Septum: Normal IAS  MAIN PA                   Size: Normal  PULMONIC VALVE             Morphology: Normal                        Mobility: Fully mobile  RIGHT VENTRICLE                   Size: MILDLY ENLARGED              Free Wall: Normal            Contraction: Normal                       RV Masses:  No Masses  TRICUSPID VALVE               Leaflets: Normal                        Mobility: Fully mobile             Morphology: Normal  RIGHT ATRIUM                   Size: MILDLY ENLARGED               RA Other: None                RA Mass: No masses  PERICARDIUM                  Fluid: TRIVIAL EFFUSION POSTERIOR  INFERIOR VENACAVA                   Size: DILATED Normal respiratory collapse  _________________________________________________________________________________________   DOPPLER ECHO and OTHER SPECIAL PROCEDURES                 Aortic: TRIVIAL AR                 No AS                         151.2 cm/sec peak vel      9.1 mmHg peak grad                         3.7 mmHg mean grad         3.1 cm^2 by DOPPLER                 Mitral: MILD MR  No MS                         MV Inflow E Vel = 123.0 cm/sec      MV Annulus E'Vel = 5.2 cm/sec                         E/E'Ratio = 23.7              Tricuspid: SEVERE TR                  No TS                         480.5 cm/sec peak TR vel   100.4 mmHg peak RV pressure              Pulmonary: MILD PR                    No PS  _________________________________________________________________________________________  INTERPRETATION  NORMAL LEFT VENTRICULAR SYSTOLIC FUNCTION   WITH MILD LVH  NORMAL RIGHT VENTRICULAR SYSTOLIC FUNCTION  SEVERE VALVULAR REGURGITATION (See above)  NO VALVULAR STENOSIS  TRIVIAL PERICARDIAL EFFUSION  MILD RV ENLARGEMENT  MILD BIATRIAL ENLARGEMENT  MILD LVH  SEVERE PULMONARY HYPERTENSION   TELEMETRY reviewed by me: Atrial flutter with RVR that started this morning with rates in the 140s-150s, improved to a flutter with rates in the 90s after administration of metoprolol  EKG reviewed by me: SVT rate 146, possibly c/w atrial flutter with RVR   ASSESSMENT AND PLAN:  Jay Morris is a 22yoM with a PMH of paroxysmal atrial fibrillation on Eliquis, severe pulmonary hypertension, chronic  respiratory failure on 3 L at baseline, CKD 4, type 2 diabetes, hypertension, hyperlipidemia, s/p hemorrhoidectomy 02/12/2022 who presented to [facility] ED the day following his procedure with nausea and dry heaving in addition to dry cough and shortness of breath.  He was febrile and chest x-ray showed bilateral infiltrates concerning for pneumonia.  He was treated with broad-spectrum antibiotics and was doing well, awaiting discharge on hospital day 6 when he developed A-fib with RVR, for which cardiology was consulted.  #Paroxysmal atrial fibrillation versus flutter with RVR #Severe sepsis 2/2 pneumonia #Acute on chronic respiratory failure #Severe pulmonary hypertension The patient presented to [facility] ED the day following his hemorrhoidectomy that was performed on 7/28 with shortness of breath, nausea, dry cough, was febrile with marked leukocytosis to 27 on admission and has been treated for pneumonia.  On hospital day 6 he was feeling well and discharge was planned by the primary team when he developed a tachyarrhythmia most consistent with atrial fibs versus flutter with RVR with rates in the 140s.  Upon review of the [facility], he had not received his home metoprolol tartrate 25 mg twice daily in the 5 days he has been in the hospital thus far.  He was given 12.5 mg of p.o. metoprolol x2 with response, HR in the 90s after that.  -Agree with current therapy for treatment of pneumonia -Continuous monitoring on telemetry -S/p 12.5 mg p.o. metoprolol tartrate x2, continue metoprolol tartrate 25 mg twice daily.  He was not given his afternoon dose of metoprolol 25 mg on 8/3 -Give 5 mg of IV metoprolol now, monitor response and then continue with twice daily dosing of metoprolol tartrate. -Continue Eliquis 5 mg twice daily for stroke risk reduction -No further cardiac diagnostics necessary -If  the patient continues to feel well, and his heart rate response to metoprolol as above, likely okay for discharge from a  cardiac standpoint this afternoon with follow-up with Dr. Nehemiah Massed in 1 to 2 weeks.  This patient's plan of care was discussed and created with Dr. Clayborn Bigness and he is in agreement.  Signed: Tristan Schroeder , PA-C 02/19/2022, 8:27 AM [facility] Cardiology

## 2022-02-19 NOTE — Plan of Care (Signed)
  Problem: Education: Goal: Knowledge of General Education information will improve Description: Including pain rating scale, medication(s)/side effects and non-pharmacologic comfort measures Outcome: Adequate for Discharge   Problem: Health Behavior/Discharge Planning: Goal: Ability to manage health-related needs will improve Outcome: Adequate for Discharge   Problem: Clinical Measurements: Goal: Ability to maintain clinical measurements within normal limits will improve Outcome: Adequate for Discharge Goal: Will remain free from infection Outcome: Adequate for Discharge Goal: Diagnostic test results will improve Outcome: Adequate for Discharge Goal: Respiratory complications will improve Outcome: Adequate for Discharge Goal: Cardiovascular complication will be avoided Outcome: Adequate for Discharge   Problem: Activity: Goal: Risk for activity intolerance will decrease Outcome: Adequate for Discharge   Problem: Nutrition: Goal: Adequate nutrition will be maintained Outcome: Adequate for Discharge   Problem: Coping: Goal: Level of anxiety will decrease Outcome: Adequate for Discharge   Problem: Elimination: Goal: Will not experience complications related to bowel motility Outcome: Adequate for Discharge Goal: Will not experience complications related to urinary retention Outcome: Adequate for Discharge   Problem: Pain Managment: Goal: General experience of comfort will improve Outcome: Adequate for Discharge   Problem: Safety: Goal: Ability to remain free from injury will improve Outcome: Adequate for Discharge   Problem: Skin Integrity: Goal: Risk for impaired skin integrity will decrease Outcome: Adequate for Discharge   Problem: Fluid Volume: Goal: Hemodynamic stability will improve Outcome: Adequate for Discharge   Problem: Clinical Measurements: Goal: Diagnostic test results will improve Outcome: Adequate for Discharge Goal: Signs and symptoms of  infection will decrease Outcome: Adequate for Discharge   Problem: Respiratory: Goal: Ability to maintain adequate ventilation will improve Outcome: Adequate for Discharge   Problem: Education: Goal: Ability to describe self-care measures that may prevent or decrease complications (Diabetes Survival Skills Education) will improve Outcome: Adequate for Discharge Goal: Individualized Educational Video(s) Outcome: Adequate for Discharge   Problem: Coping: Goal: Ability to adjust to condition or change in health will improve Outcome: Adequate for Discharge   Problem: Fluid Volume: Goal: Ability to maintain a balanced intake and output will improve Outcome: Adequate for Discharge   Problem: Health Behavior/Discharge Planning: Goal: Ability to identify and utilize available resources and services will improve Outcome: Adequate for Discharge Goal: Ability to manage health-related needs will improve Outcome: Adequate for Discharge   Problem: Metabolic: Goal: Ability to maintain appropriate glucose levels will improve Outcome: Adequate for Discharge   Problem: Nutritional: Goal: Maintenance of adequate nutrition will improve Outcome: Adequate for Discharge Goal: Progress toward achieving an optimal weight will improve Outcome: Adequate for Discharge   Problem: Skin Integrity: Goal: Risk for impaired skin integrity will decrease Outcome: Adequate for Discharge   Problem: Tissue Perfusion: Goal: Adequacy of tissue perfusion will improve Outcome: Adequate for Discharge

## 2022-02-22 ENCOUNTER — Inpatient Hospital Stay: Payer: Medicare HMO | Attending: Oncology

## 2022-02-22 ENCOUNTER — Inpatient Hospital Stay: Payer: Medicare HMO

## 2022-02-22 VITALS — BP 106/58 | HR 80

## 2022-02-22 DIAGNOSIS — N184 Chronic kidney disease, stage 4 (severe): Secondary | ICD-10-CM | POA: Diagnosis present

## 2022-02-22 DIAGNOSIS — D649 Anemia, unspecified: Secondary | ICD-10-CM | POA: Diagnosis not present

## 2022-02-22 DIAGNOSIS — D631 Anemia in chronic kidney disease: Secondary | ICD-10-CM

## 2022-02-22 LAB — HEMOGLOBIN AND HEMATOCRIT, BLOOD
HCT: 30.5 % — ABNORMAL LOW (ref 39.0–52.0)
Hemoglobin: 9.8 g/dL — ABNORMAL LOW (ref 13.0–17.0)

## 2022-02-22 LAB — FERRITIN: Ferritin: 86 ng/mL (ref 24–336)

## 2022-02-22 MED ORDER — EPOETIN ALFA-EPBX 40000 UNIT/ML IJ SOLN
40000.0000 [IU] | Freq: Once | INTRAMUSCULAR | Status: AC
  Administered 2022-02-22: 40000 [IU] via SUBCUTANEOUS
  Filled 2022-02-22: qty 1

## 2022-03-24 ENCOUNTER — Inpatient Hospital Stay: Payer: Medicare HMO

## 2022-03-24 ENCOUNTER — Inpatient Hospital Stay: Payer: Medicare HMO | Attending: Oncology

## 2022-03-24 VITALS — BP 121/64 | HR 76

## 2022-03-24 DIAGNOSIS — D631 Anemia in chronic kidney disease: Secondary | ICD-10-CM

## 2022-03-24 DIAGNOSIS — D472 Monoclonal gammopathy: Secondary | ICD-10-CM

## 2022-03-24 DIAGNOSIS — D649 Anemia, unspecified: Secondary | ICD-10-CM

## 2022-03-24 DIAGNOSIS — N184 Chronic kidney disease, stage 4 (severe): Secondary | ICD-10-CM | POA: Insufficient documentation

## 2022-03-24 LAB — CBC WITH DIFFERENTIAL/PLATELET
Abs Immature Granulocytes: 0.04 10*3/uL (ref 0.00–0.07)
Basophils Absolute: 0.1 10*3/uL (ref 0.0–0.1)
Basophils Relative: 1 %
Eosinophils Absolute: 0.1 10*3/uL (ref 0.0–0.5)
Eosinophils Relative: 1 %
HCT: 28.4 % — ABNORMAL LOW (ref 39.0–52.0)
Hemoglobin: 9 g/dL — ABNORMAL LOW (ref 13.0–17.0)
Immature Granulocytes: 0 %
Lymphocytes Relative: 20 %
Lymphs Abs: 1.9 10*3/uL (ref 0.7–4.0)
MCH: 30.8 pg (ref 26.0–34.0)
MCHC: 31.7 g/dL (ref 30.0–36.0)
MCV: 97.3 fL (ref 80.0–100.0)
Monocytes Absolute: 0.7 10*3/uL (ref 0.1–1.0)
Monocytes Relative: 8 %
Neutro Abs: 6.5 10*3/uL (ref 1.7–7.7)
Neutrophils Relative %: 70 %
Platelets: 229 10*3/uL (ref 150–400)
RBC: 2.92 MIL/uL — ABNORMAL LOW (ref 4.22–5.81)
RDW: 15.6 % — ABNORMAL HIGH (ref 11.5–15.5)
WBC: 9.3 10*3/uL (ref 4.0–10.5)
nRBC: 0 % (ref 0.0–0.2)

## 2022-03-24 LAB — IRON AND TIBC
Iron: 42 ug/dL — ABNORMAL LOW (ref 45–182)
Saturation Ratios: 12 % — ABNORMAL LOW (ref 17.9–39.5)
TIBC: 350 ug/dL (ref 250–450)
UIBC: 308 ug/dL

## 2022-03-24 LAB — FERRITIN: Ferritin: 43 ng/mL (ref 24–336)

## 2022-03-24 MED ORDER — EPOETIN ALFA-EPBX 40000 UNIT/ML IJ SOLN
40000.0000 [IU] | Freq: Once | INTRAMUSCULAR | Status: AC
  Administered 2022-03-24: 40000 [IU] via SUBCUTANEOUS
  Filled 2022-03-24: qty 1

## 2022-04-09 ENCOUNTER — Other Ambulatory Visit: Payer: Self-pay | Admitting: Physician Assistant

## 2022-04-09 ENCOUNTER — Encounter: Payer: Self-pay | Admitting: Physician Assistant

## 2022-04-09 NOTE — Progress Notes (Signed)
I spoke with the patient's daughters today. His wife performs his once daily in-and-out catheterization, but she is currently hospitalized; EDD unknown. They request home health orders. Orders faxed today to [facility]. Notably, I spoke with [facility] staff, who report they did not receive a fax from Dr. Doy Hutching' office as per their notes. Will reach out to his office to clarify.

## 2022-04-12 ENCOUNTER — Ambulatory Visit: Payer: Medicare HMO | Admitting: Physician Assistant

## 2022-04-15 ENCOUNTER — Other Ambulatory Visit: Payer: Self-pay | Admitting: Surgery

## 2022-04-15 ENCOUNTER — Other Ambulatory Visit (HOSPITAL_COMMUNITY): Payer: Self-pay | Admitting: Surgery

## 2022-04-15 DIAGNOSIS — K6289 Other specified diseases of anus and rectum: Secondary | ICD-10-CM

## 2022-04-20 NOTE — Progress Notes (Unsigned)
[facility]  [phone] [phone]) [phone]  ID: [name] OB: [birth year]  MR#: [id number]  MNO#:[id number]  Patient Care Team: [name], MD as PCP - General (Internal Medicine)  CHIEF COMPLAINT: Anemia, unspecified.  INTERVAL HISTORY: Patient returns to clinic today for further evaluation and consideration of additional Retacrit.  He was admitted to the hospital approximately 1 month ago with a GI bleed requiring several units of packed red blood cells.  He currently feels well and back to his baseline.  He has no neurologic complaints.  He denies any recent fevers or illnesses.  He has a good appetite and denies weight loss.  He has chronic shortness of breath requiring oxygen, but denies any chest pain, cough, or hemoptysis.  He denies any nausea, vomiting, constipation, or diarrhea.  He has no melena or hematochezia.  He has no urinary complaints.  Patient offers no further specific complaints today.  REVIEW OF SYSTEMS:   Review of Systems  Constitutional: Negative.  Negative for fever, malaise/fatigue and weight loss.  Respiratory:  Positive for shortness of breath. Negative for cough and hemoptysis.   Cardiovascular: Negative.  Negative for chest pain and leg swelling.  Gastrointestinal: Negative.  Negative for abdominal pain, blood in stool and melena.  Genitourinary: Negative.  Negative for hematuria.  Musculoskeletal: Negative.  Negative for back pain.  Skin: Negative.  Negative for rash.  Neurological: Negative.  Negative for dizziness, focal weakness, weakness and headaches.  Psychiatric/Behavioral: Negative.  The patient is not nervous/anxious.     As per HPI. Otherwise, a complete review of systems is negative.  PAST MEDICAL HISTORY: Past Medical History:  Diagnosis Date   (HFpEF) heart failure with preserved ejection fraction ([place])    a.) TTE 04/01/2016: EF 60-65%, LAE, MAC, mild MR/AR/TR, mild-mod PR, G2DD; b.) TTE  12/18/2018: EF 55%, mild LVH, mild AR/MR/TR. mod PR; c.) TTE 05/31/2019: EF 60-65%, mild LVH, RAE/RVE, triv AR.MT, PR, mild TR; d.) TTE 10/26/2019: EF 55%, BAE, RVE, triv AR, mild MR/PR, sev TR, G2DD; e.) R/LHC 01/17/2020: mPA 52, PCWP 25, LVEDP 19, RVEDP 25, mRA 13   Anemia    Aortic atherosclerosis (HCC)    Asthma    BPH (benign prostatic hyperplasia)    CKD (chronic kidney disease), stage III (HCC)    COPD (chronic obstructive pulmonary disease) ([place])    Coronary artery disease 01/17/2020   a.) LHC 01/17/2020: 25% oLAD, 65% o-pRCA, 45% pLAD, 65% mLAD, 35% OM2 --> med mgmt.   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   PAF (paroxysmal atrial fibrillation) (HCC)    a.) CHA2DS2-VASc = 6 (age x 2, HFpEF, HTN, vascular disease, T2DM);  b.) cardiac ablation in approx 2014-2015 at [facility]; c.) A.fib recurrence in setting of SARS-CoV-2 --> Tx'd with IV amiodarone + metoprolol + DCCV x 1 (200 J) 06/22/2019 restoring NSR; d.) rate/rhythm maintained without pharmacological intervention; chronically anticoagulated using standard dose apixaban   Pneumonia due to COVID-19 virus 06/02/2019   a.) hospitalized at [facility]) from 06/02/2019 - 06/25/2019; Tx'd with steriods + remdesivir +  convalescent plasma   Pulmonary fibrosis (HCC)    Pulmonary HTN ([place]) 04/01/2016   a.) TTE 04/01/2016: EF 60-65%, RVSP 40; b.) TTE 12/18/2018: EF 55%, RVSP 45.9; c.) TTE 05/31/2019: EF 60-65%, RVSP 35.5; d.) TTE 10/26/2019: EF 55%, RVSP 100.4; e.) R/LHC 01/17/2020: mPA 52, PCWP 25, LVEDP 19, RVEDP 25, mRA 13; f.) Tx with PDE5i (sildenafil) +  vasodilator (treprostinil)   Type 2 diabetes mellitus treated with insulin ([place])     PAST SURGICAL HISTORY: Past Surgical History:  Procedure Laterality Date   BACK SURGERY     x3 L3-L4    CARDIAC ELECTROPHYSIOLOGY MAPPING AND ABLATION N/A    performed at [facility] in approximately 2014-2015   CARDIOVERSION N/A 06/22/2019   Procedure: CARDIOVERSION;  Surgeon: Fay Records, MD;  Location: [facility];  Service: Cardiovascular;  Laterality: N/A;   COLONOSCOPY WITH PROPOFOL N/A 06/15/2017   Procedure: COLONOSCOPY WITH PROPOFOL;  Surgeon: Toledo, Benay Pike, MD;  Location: [facility] ENDOSCOPY;  Service: Gastroenterology;  Laterality: N/A;   ENTEROSCOPY N/A 11/24/2017   Procedure: ENTEROSCOPY;  Surgeon: Jonathon Bellows, MD;  Location: [facility] ENDOSCOPY;  Service: Gastroenterology;  Laterality: N/A;   ESOPHAGOGASTRODUODENOSCOPY N/A 11/27/2017   Procedure: ESOPHAGOGASTRODUODENOSCOPY (EGD);  Surgeon: Lucilla Lame, MD;  Location: [facility] ENDOSCOPY;  Service: Endoscopy;  Laterality: N/A;   ESOPHAGOGASTRODUODENOSCOPY (EGD) WITH PROPOFOL N/A 06/02/2017   Procedure: ESOPHAGOGASTRODUODENOSCOPY (EGD) WITH PROPOFOL;  Surgeon: Toledo, Benay Pike, MD;  Location: [facility] ENDOSCOPY;  Service: Gastroenterology;  Laterality: N/A;   ESOPHAGOGASTRODUODENOSCOPY (EGD) WITH PROPOFOL N/A 08/04/2021   Procedure: ESOPHAGOGASTRODUODENOSCOPY (EGD) WITH PROPOFOL;  Surgeon: Lesly Rubenstein, MD;  Location: [facility] ENDOSCOPY;  Service: Endoscopy;  Laterality: N/A;   EVALUATION UNDER ANESTHESIA WITH HEMORRHOIDECTOMY N/A 02/12/2022   Procedure: EXAM UNDER ANESTHESIA WITH HEMORRHOIDECTOMY;  Surgeon: Benjamine Sprague, DO;  Location: [facility] ORS;  Service: General;  Laterality: N/A;   GIVENS CAPSULE STUDY N/A 11/25/2017   Procedure: GIVENS CAPSULE STUDY;  Surgeon: Jonathon Bellows, MD;  Location: [facility] ENDOSCOPY;  Service: Gastroenterology;  Laterality: N/A;   HAND SURGERY     RIGHT/LEFT HEART CATH AND CORONARY ANGIOGRAPHY N/A 01/17/2020   Procedure: RIGHT/LEFT  HEART CATH AND CORONARY ANGIOGRAPHY;  Surgeon: Corey Skains, MD;  Location: [facility] CV LAB;  Service: Cardiovascular;  Laterality: N/A;    FAMILY HISTORY: Family History  Problem Relation Age of Onset   Heart disease Mother    Cancer Father    Cancer Paternal Grandfather     ADVANCED DIRECTIVES (Y/N):  N  HEALTH MAINTENANCE: Social History   Tobacco Use   Smoking status: Former    Packs/day: 1.00    Years: 30.00    Total pack years: 30.00    Types: Cigarettes    Quit date: 1995    Years since quitting: 28.7   Smokeless tobacco: Never  Vaping Use   Vaping Use: Never used  Substance Use Topics   Alcohol use: No    Alcohol/week: 0.0 standard drinks of alcohol    Comment: occasional   Drug use: No     Colonoscopy:  PAP:  Bone density:  Lipid panel:  Allergies  Allergen Reactions   Shrimp [Shellfish Allergy] Anaphylaxis   Shrimp Extract Allergy Skin Test     Current Outpatient Medications  Medication Sig Dispense Refill   acetaminophen (TYLENOL) 500 MG tablet Take 500 mg by mouth every 6 (six) hours as needed for moderate pain or headache. 30 tablet 0   albuterol (VENTOLIN HFA) 108 (90 Base) MCG/ACT inhaler Inhale 2 puffs into the  lungs daily.     allopurinol (ZYLOPRIM) 100 MG tablet Take 2 tablets by mouth daily.     ascorbic acid (VITAMIN C) 500 MG tablet Take 500 mg by mouth daily.     atorvastatin (LIPITOR) 20 MG tablet Take 20 mg by mouth at bedtime.      BREO ELLIPTA 100-25 MCG/INH AEPB Inhale 1 puff into the lungs daily.     Cholecalciferol (DIALYVITE VITAMIN D 5000) 125 MCG (5000 UT) capsule Take 5,000 Units by mouth daily.     ELIQUIS 5 MG TABS tablet SMARTSIG:1 Tablet(s) By Mouth Every 12 Hours     finasteride (PROSCAR) 5 MG tablet TAKE 1 TABLET BY MOUTH DAILY 30 tablet 11   furosemide (LASIX) 80 MG tablet Take 40 mg by mouth daily.     guaiFENesin (MUCINEX) 600 MG 12 hr tablet Take 1 tablet (600 mg total) by mouth 2 (two) times daily. 16  tablet 0   insulin aspart (NOVOLOG) 100 UNIT/ML FlexPen Inject 20-26 Units into the skin 3 (three) times daily with meals. 20 units am 26 units noon, 26 units evening     insulin degludec (TRESIBA FLEXTOUCH) 200 UNIT/ML FlexTouch Pen Inject 60 Units into the skin in the morning and at bedtime.     lidocaine (XYLOCAINE) 5 % ointment Apply 1 Application topically 3 (three) times daily as needed. Apply to affected area as needed 3 times daily 35.44 g 0   lisinopril (ZESTRIL) 20 MG tablet Take 10 mg by mouth at bedtime.      magnesium oxide (MAG-OX) 400 MG tablet Take 1 tablet by mouth 3 (three) times daily.     metoprolol tartrate (LOPRESSOR) 25 MG tablet Take 1 tablet (25 mg total) by mouth 2 (two) times daily. 60 tablet 1   nystatin-triamcinolone ointment (MYCOLOG) Apply 1 Application topically 2 (two) times daily. Do not use for longer than 4-6 weeks. 30 g 1   oxyCODONE (ROXICODONE) 5 MG immediate release tablet Take 1 tablet (5 mg total) by mouth every 8 (eight) hours as needed. 15 tablet 0   pantoprazole (PROTONIX) 40 MG tablet Take 1 tablet (40 mg total) by mouth 2 (two) times daily before a meal. 60 tablet 0   sildenafil (REVATIO) 20 MG tablet Take 20 mg by mouth 3 (three) times daily.     TYVASO REFILL 0.6 MG/ML SOLN Inhale 18 mcg into the lungs in the morning, at noon, in the evening, and at bedtime.     vitamin B-12 (CYANOCOBALAMIN) 1000 MCG tablet Take 1,000 mcg by mouth daily.     No current facility-administered medications for this visit.    OBJECTIVE: There were no vitals filed for this visit.    There is no height or weight on file to calculate BMI.    ECOG FS:1 - Symptomatic but completely ambulatory  General: Well-developed, well-nourished, no acute distress. Eyes: Pink conjunctiva, anicteric sclera. HEENT: Normocephalic, moist mucous membranes. Lungs: No audible wheezing or coughing. Heart: Regular rate and rhythm. Abdomen: Soft, nontender, no obvious  distention. Musculoskeletal: No edema, cyanosis, or clubbing. Neuro: Alert, answering all questions appropriately. Cranial nerves grossly intact. Skin: No rashes or petechiae noted. Psych: Normal affect.  LAB RESULTS:  Lab Results  Component Value Date   NA 135 02/18/2022   K 4.6 02/18/2022   CL 100 02/18/2022   CO2 25 02/18/2022   GLUCOSE 182 (H) 02/18/2022   BUN 62 (H) 02/18/2022   CREATININE 2.22 (H) 02/18/2022   CALCIUM 8.8 (L) 02/18/2022  PROT 6.8 02/12/2022   ALBUMIN 3.9 02/12/2022   AST 23 02/12/2022   ALT 21 02/12/2022   ALKPHOS 34 (L) 02/12/2022   BILITOT 1.1 02/12/2022   GFRNONAA 30 (L) 02/18/2022   GFRAA 39 (L) 10/18/2019    Lab Results  Component Value Date   WBC 9.3 03/24/2022   NEUTROABS 6.5 03/24/2022   HGB 9.0 (L) 03/24/2022   HCT 28.4 (L) 03/24/2022   MCV 97.3 03/24/2022   PLT 229 03/24/2022   Lab Results  Component Value Date   IRON 42 (L) 03/24/2022   TIBC 350 03/24/2022   IRONPCTSAT 12 (L) 03/24/2022   Lab Results  Component Value Date   FERRITIN 43 03/24/2022     STUDIES: No results found.   ASSESSMENT: Anemia, unspecified.  PLAN:   1. Anemia, unspecified: Bone marrow biopsy completed on June 25, 2020 was essentially negative only with a hypercellular marrow for age.  Patient was noted to have decreased iron stores and rare ringed sideroblasts.  FISH and cytogenetics were negative.  Iron stores are within normal limits.  Previously, repeat B12, folate, and hemolysis labs were found to be either negative or within normal limits.  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  He does not require additional Venofer today.  His last dose was on Dec 03, 2020.  Proceed with 40,000 units Retacrit.  Return to clinic monthly for laboratory work and Retacrit if his hemoglobin falls below 10.0.  Patient will then return to clinic in 4 months for further evaluation and continuation of treatment if  needed.   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  Kappa free light chains are only mildly elevated and immunoglobulins are within normal limits.  Bone marrow biopsy only revealed approximately 1% plasma cells.  This is likely clinically insignificant, but will continue to monitor on a yearly basis. 3.  Chronic renal insufficiency.  Chronic and unchanged.  Follow-up with nephrology as indicated.  Retacrit as above.  I spent a total of 30 minutes reviewing chart data, face-to-face evaluation with the patient, counseling and coordination of care as detailed above.   Patient expressed understanding and was in agreement with this plan. He also understands that He can call clinic at any time with any questions, concerns, or complaints.    Lloyd Huger, MD   04/20/2022 10:30 PM

## 2022-04-21 ENCOUNTER — Inpatient Hospital Stay: Payer: Medicare HMO | Attending: Oncology

## 2022-04-21 ENCOUNTER — Inpatient Hospital Stay (HOSPITAL_BASED_OUTPATIENT_CLINIC_OR_DEPARTMENT_OTHER): Payer: Medicare HMO | Admitting: Oncology

## 2022-04-21 ENCOUNTER — Inpatient Hospital Stay: Payer: Medicare HMO

## 2022-04-21 ENCOUNTER — Encounter: Payer: Self-pay | Admitting: Oncology

## 2022-04-21 VITALS — BP 136/61 | HR 64

## 2022-04-21 VITALS — BP 116/57 | HR 70 | Temp 99.6°F | Wt 191.0 lb

## 2022-04-21 DIAGNOSIS — N184 Chronic kidney disease, stage 4 (severe): Secondary | ICD-10-CM | POA: Diagnosis present

## 2022-04-21 DIAGNOSIS — D649 Anemia, unspecified: Secondary | ICD-10-CM

## 2022-04-21 DIAGNOSIS — D631 Anemia in chronic kidney disease: Secondary | ICD-10-CM

## 2022-04-21 LAB — CBC WITH DIFFERENTIAL/PLATELET
Abs Immature Granulocytes: 0.03 10*3/uL (ref 0.00–0.07)
Basophils Absolute: 0.1 10*3/uL (ref 0.0–0.1)
Basophils Relative: 1 %
Eosinophils Absolute: 0.1 10*3/uL (ref 0.0–0.5)
Eosinophils Relative: 1 %
HCT: 28.4 % — ABNORMAL LOW (ref 39.0–52.0)
Hemoglobin: 8.9 g/dL — ABNORMAL LOW (ref 13.0–17.0)
Immature Granulocytes: 0 %
Lymphocytes Relative: 17 %
Lymphs Abs: 1.8 10*3/uL (ref 0.7–4.0)
MCH: 29.9 pg (ref 26.0–34.0)
MCHC: 31.3 g/dL (ref 30.0–36.0)
MCV: 95.3 fL (ref 80.0–100.0)
Monocytes Absolute: 0.9 10*3/uL (ref 0.1–1.0)
Monocytes Relative: 9 %
Neutro Abs: 7.7 10*3/uL (ref 1.7–7.7)
Neutrophils Relative %: 72 %
Platelets: 274 10*3/uL (ref 150–400)
RBC: 2.98 MIL/uL — ABNORMAL LOW (ref 4.22–5.81)
RDW: 16.2 % — ABNORMAL HIGH (ref 11.5–15.5)
WBC: 10.5 10*3/uL (ref 4.0–10.5)
nRBC: 0 % (ref 0.0–0.2)

## 2022-04-21 LAB — COMPREHENSIVE METABOLIC PANEL
ALT: 14 U/L (ref 0–44)
AST: 15 U/L (ref 15–41)
Albumin: 4 g/dL (ref 3.5–5.0)
Alkaline Phosphatase: 39 U/L (ref 38–126)
Anion gap: 8 (ref 5–15)
BUN: 47 mg/dL — ABNORMAL HIGH (ref 8–23)
CO2: 25 mmol/L (ref 22–32)
Calcium: 9.1 mg/dL (ref 8.9–10.3)
Chloride: 105 mmol/L (ref 98–111)
Creatinine, Ser: 2.83 mg/dL — ABNORMAL HIGH (ref 0.61–1.24)
GFR, Estimated: 22 mL/min — ABNORMAL LOW (ref >60)
Glucose, Bld: 230 mg/dL — ABNORMAL HIGH (ref 70–99)
Potassium: 4.1 mmol/L (ref 3.5–5.1)
Sodium: 138 mmol/L (ref 135–145)
Total Bilirubin: 0.7 mg/dL (ref 0.3–1.2)
Total Protein: 7.3 g/dL (ref 6.5–8.1)

## 2022-04-21 LAB — IRON AND TIBC
Iron: 34 ug/dL — ABNORMAL LOW (ref 45–182)
Saturation Ratios: 8 % — ABNORMAL LOW (ref 17.9–39.5)
TIBC: 427 ug/dL (ref 250–450)
UIBC: 393 ug/dL

## 2022-04-21 LAB — FERRITIN: Ferritin: 19 ng/mL — ABNORMAL LOW (ref 24–336)

## 2022-04-21 MED ORDER — SODIUM CHLORIDE 0.9 % IV SOLN
INTRAVENOUS | Status: DC
  Filled 2022-04-21: qty 250

## 2022-04-21 MED ORDER — SODIUM CHLORIDE 0.9 % IV SOLN
200.0000 mg | Freq: Once | INTRAVENOUS | Status: AC
  Administered 2022-04-21: 200 mg via INTRAVENOUS
  Filled 2022-04-21: qty 200

## 2022-04-21 NOTE — Patient Instructions (Signed)
Iron Sucrose Injection What is this medication? IRON SUCROSE (EYE ern SOO krose) treats low levels of iron (iron deficiency anemia) in people with kidney disease. Iron is a mineral that plays an important role in making red blood cells, which carry oxygen from your lungs to the rest of your body. This medicine may be used for other purposes; ask your health care provider or pharmacist if you have questions. COMMON BRAND NAME(S): Venofer What should I tell my care team before I take this medication? They need to know if you have any of these conditions: Anemia not caused by low iron levels Heart disease High levels of iron in the blood Kidney disease Liver disease An unusual or allergic reaction to iron, other medications, foods, dyes, or preservatives Pregnant or trying to get pregnant Breast-feeding How should I use this medication? This medication is for infusion into a vein. It is given in a hospital or clinic setting. Talk to your care team about the use of this medication in children. While this medication may be prescribed for children as young as 2 years for selected conditions, precautions do apply. Overdosage: If you think you have taken too much of this medicine contact a poison control center or emergency room at once. NOTE: This medicine is only for you. Do not share this medicine with others. What if I miss a dose? It is important not to miss your dose. Call your care team if you are unable to keep an appointment. What may interact with this medication? Do not take this medication with any of the following: Deferoxamine Dimercaprol Other iron products This medication may also interact with the following: Chloramphenicol Deferasirox This list may not describe all possible interactions. Give your health care provider a list of all the medicines, herbs, non-prescription drugs, or dietary supplements you use. Also tell them if you smoke, drink alcohol, or use illegal drugs.  Some items may interact with your medicine. What should I watch for while using this medication? Visit your care team regularly. Tell your care team if your symptoms do not start to get better or if they get worse. You may need blood work done while you are taking this medication. You may need to follow a special diet. Talk to your care team. Foods that contain iron include: whole grains/cereals, dried fruits, beans, or peas, leafy green vegetables, and organ meats (liver, kidney). What side effects may I notice from receiving this medication? Side effects that you should report to your care team as soon as possible: Allergic reactions--skin rash, itching, hives, swelling of the face, lips, tongue, or throat Low blood pressure--dizziness, feeling faint or lightheaded, blurry vision Shortness of breath Side effects that usually do not require medical attention (report to your care team if they continue or are bothersome): Flushing Headache Joint pain Muscle pain Nausea Pain, redness, or irritation at injection site This list may not describe all possible side effects. Call your doctor for medical advice about side effects. You may report side effects to FDA at 1-800-FDA-1088. Where should I keep my medication? This medication is given in a hospital or clinic and will not be stored at home. NOTE: This sheet is a summary. It may not cover all possible information. If you have questions about this medicine, talk to your doctor, pharmacist, or health care provider.  2023 Elsevier/Gold Standard (2007-08-26 00:00:00)

## 2022-04-22 ENCOUNTER — Encounter: Payer: Self-pay | Admitting: Oncology

## 2022-04-22 LAB — KAPPA/LAMBDA LIGHT CHAINS
Kappa free light chain: 50.4 mg/L — ABNORMAL HIGH (ref 3.3–19.4)
Kappa, lambda light chain ratio: 1.37 (ref 0.26–1.65)
Lambda free light chains: 36.9 mg/L — ABNORMAL HIGH (ref 5.7–26.3)

## 2022-04-23 LAB — PROTEIN ELECTROPHORESIS, SERUM
A/G Ratio: 1.3 (ref 0.7–1.7)
Albumin ELP: 3.7 g/dL (ref 2.9–4.4)
Alpha-1-Globulin: 0.2 g/dL (ref 0.0–0.4)
Alpha-2-Globulin: 0.9 g/dL (ref 0.4–1.0)
Beta Globulin: 1 g/dL (ref 0.7–1.3)
Gamma Globulin: 0.8 g/dL (ref 0.4–1.8)
Globulin, Total: 2.8 g/dL (ref 2.2–3.9)
Total Protein ELP: 6.5 g/dL (ref 6.0–8.5)

## 2022-04-26 ENCOUNTER — Ambulatory Visit
Admission: RE | Admit: 2022-04-26 | Discharge: 2022-04-26 | Disposition: A | Discharge status: Home / Self Care | Payer: Medicare HMO | Source: Ambulatory Visit | Attending: Surgery | Admitting: Surgery

## 2022-04-26 DIAGNOSIS — K6289 Other specified diseases of anus and rectum: Secondary | ICD-10-CM | POA: Diagnosis present

## 2022-04-27 ENCOUNTER — Inpatient Hospital Stay: Payer: Medicare HMO

## 2022-04-27 VITALS — BP 101/51 | HR 69 | Temp 97.8°F | Resp 20

## 2022-04-27 DIAGNOSIS — D649 Anemia, unspecified: Secondary | ICD-10-CM | POA: Diagnosis not present

## 2022-04-27 MED ORDER — SODIUM CHLORIDE 0.9 % IV SOLN
INTRAVENOUS | Status: DC
  Filled 2022-04-27: qty 250

## 2022-04-27 MED ORDER — SODIUM CHLORIDE 0.9 % IV SOLN
200.0000 mg | Freq: Once | INTRAVENOUS | Status: AC
  Administered 2022-04-27: 200 mg via INTRAVENOUS
  Filled 2022-04-27: qty 200

## 2022-04-27 NOTE — Patient Instructions (Signed)
Iron Sucrose Injection What is this medication? IRON SUCROSE (EYE ern SOO krose) treats low levels of iron (iron deficiency anemia) in people with kidney disease. Iron is a mineral that plays an important role in making red blood cells, which carry oxygen from your lungs to the rest of your body. This medicine may be used for other purposes; ask your health care provider or pharmacist if you have questions. COMMON BRAND NAME(S): Venofer What should I tell my care team before I take this medication? They need to know if you have any of these conditions: Anemia not caused by low iron levels Heart disease High levels of iron in the blood Kidney disease Liver disease An unusual or allergic reaction to iron, other medications, foods, dyes, or preservatives Pregnant or trying to get pregnant Breast-feeding How should I use this medication? This medication is for infusion into a vein. It is given in a hospital or clinic setting. Talk to your care team about the use of this medication in children. While this medication may be prescribed for children as young as 2 years for selected conditions, precautions do apply. Overdosage: If you think you have taken too much of this medicine contact a poison control center or emergency room at once. NOTE: This medicine is only for you. Do not share this medicine with others. What if I miss a dose? It is important not to miss your dose. Call your care team if you are unable to keep an appointment. What may interact with this medication? Do not take this medication with any of the following: Deferoxamine Dimercaprol Other iron products This medication may also interact with the following: Chloramphenicol Deferasirox This list may not describe all possible interactions. Give your health care provider a list of all the medicines, herbs, non-prescription drugs, or dietary supplements you use. Also tell them if you smoke, drink alcohol, or use illegal drugs.  Some items may interact with your medicine. What should I watch for while using this medication? Visit your care team regularly. Tell your care team if your symptoms do not start to get better or if they get worse. You may need blood work done while you are taking this medication. You may need to follow a special diet. Talk to your care team. Foods that contain iron include: whole grains/cereals, dried fruits, beans, or peas, leafy green vegetables, and organ meats (liver, kidney). What side effects may I notice from receiving this medication? Side effects that you should report to your care team as soon as possible: Allergic reactions--skin rash, itching, hives, swelling of the face, lips, tongue, or throat Low blood pressure--dizziness, feeling faint or lightheaded, blurry vision Shortness of breath Side effects that usually do not require medical attention (report to your care team if they continue or are bothersome): Flushing Headache Joint pain Muscle pain Nausea Pain, redness, or irritation at injection site This list may not describe all possible side effects. Call your doctor for medical advice about side effects. You may report side effects to FDA at 1-800-FDA-1088. Where should I keep my medication? This medication is given in a hospital or clinic and will not be stored at home. NOTE: This sheet is a summary. It may not cover all possible information. If you have questions about this medicine, talk to your doctor, pharmacist, or health care provider.  2023 Elsevier/Gold Standard (2007-08-26 00:00:00)

## 2022-04-28 MED FILL — Iron Sucrose Inj 20 MG/ML (Fe Equiv): INTRAVENOUS | Qty: 10 | Status: AC

## 2022-04-29 ENCOUNTER — Inpatient Hospital Stay: Payer: Medicare HMO

## 2022-04-29 VITALS — BP 107/56 | HR 69 | Temp 98.0°F | Resp 22

## 2022-04-29 DIAGNOSIS — D649 Anemia, unspecified: Secondary | ICD-10-CM

## 2022-04-29 MED ORDER — SODIUM CHLORIDE 0.9 % IV SOLN
200.0000 mg | Freq: Once | INTRAVENOUS | Status: AC
  Administered 2022-04-29: 200 mg via INTRAVENOUS
  Filled 2022-04-29: qty 200

## 2022-04-29 MED ORDER — SODIUM CHLORIDE 0.9 % IV SOLN
INTRAVENOUS | Status: DC | PRN
  Filled 2022-04-29: qty 250

## 2022-05-04 ENCOUNTER — Inpatient Hospital Stay: Payer: Medicare HMO

## 2022-05-04 VITALS — BP 105/50 | HR 70 | Temp 97.9°F | Resp 20

## 2022-05-04 DIAGNOSIS — D649 Anemia, unspecified: Secondary | ICD-10-CM | POA: Diagnosis not present

## 2022-05-04 MED ORDER — SODIUM CHLORIDE 0.9 % IV SOLN
INTRAVENOUS | Status: DC
  Filled 2022-05-04: qty 250

## 2022-05-04 MED ORDER — SODIUM CHLORIDE 0.9 % IV SOLN
200.0000 mg | Freq: Once | INTRAVENOUS | Status: AC
  Administered 2022-05-04: 200 mg via INTRAVENOUS
  Filled 2022-05-04: qty 200

## 2022-05-04 NOTE — Patient Instructions (Signed)
[facility] AT [place]  Discharge Instructions: Thank you for choosing [facility] to provide your oncology and hematology care.  If you have a lab appointment with the [facility], please go directly to the [facility] and check in at the registration area.  Wear comfortable clothing and clothing appropriate for easy access to any Portacath or PICC line.   We strive to give you quality time with your provider. You may need to reschedule your appointment if you arrive late (15 or more minutes).  Arriving late affects you and other patients whose appointments are after yours.  Also, if you miss three or more appointments without notifying the office, you may be dismissed from the clinic at the provider's discretion.      For prescription refill requests, have your pharmacy contact our office and allow 72 hours for refills to be completed.       To help prevent nausea and vomiting after your treatment, we encourage you to take your nausea medication as directed.  BELOW ARE SYMPTOMS THAT SHOULD BE REPORTED IMMEDIATELY: *FEVER GREATER THAN 100.4 F (38 C) OR HIGHER *CHILLS OR SWEATING *NAUSEA AND VOMITING THAT IS NOT CONTROLLED WITH YOUR NAUSEA MEDICATION *UNUSUAL SHORTNESS OF BREATH *UNUSUAL BRUISING OR BLEEDING *URINARY PROBLEMS (pain or burning when urinating, or frequent urination) *BOWEL PROBLEMS (unusual diarrhea, constipation, pain near the anus) TENDERNESS IN MOUTH AND THROAT WITH OR WITHOUT PRESENCE OF ULCERS (sore throat, sores in mouth, or a toothache) UNUSUAL RASH, SWELLING OR PAIN  UNUSUAL VAGINAL DISCHARGE OR ITCHING   Items with * indicate a potential emergency and should be followed up as soon as possible or go to the Emergency Department if any problems should occur.  Please show the CHEMOTHERAPY ALERT CARD or IMMUNOTHERAPY ALERT CARD at check-in to the Emergency Department and triage nurse.  Should you have questions after your  visit or need to cancel or reschedule your appointment, please contact [facility] [place] AT [place]  [phone] and follow the prompts.  Office hours are 8:00 a.m. to 4:30 p.m. Monday - Friday. Please note that voicemails left after 4:00 p.m. may not be returned until the following business day.  We are closed weekends and major holidays. You have access to a nurse at all times for urgent questions. Please call the main number to the clinic [phone] and follow the prompts.  For any non-urgent questions, you may also contact your provider using MyChart. We now offer e-Visits for anyone 60 and older to request care online for non-urgent symptoms. For details visit [email or web address].   Also download the MyChart app! Go to the app store, search "MyChart", open the app, select [facility], and log in with your MyChart username and password.  Masks are optional in the cancer centers. If you would like for your care team to wear a mask while they are taking care of you, please let them know. For doctor visits, patients may have with them one support person who is at least 78 years old. At this time, visitors are not allowed in the infusion area.

## 2022-05-05 MED FILL — Iron Sucrose Inj 20 MG/ML (Fe Equiv): INTRAVENOUS | Qty: 10 | Status: AC

## 2022-05-06 ENCOUNTER — Inpatient Hospital Stay: Payer: Medicare HMO

## 2022-05-06 VITALS — BP 121/51 | HR 68 | Temp 96.6°F | Resp 18

## 2022-05-06 DIAGNOSIS — D649 Anemia, unspecified: Secondary | ICD-10-CM

## 2022-05-06 MED ORDER — SODIUM CHLORIDE 0.9 % IV SOLN
INTRAVENOUS | Status: DC
  Filled 2022-05-06: qty 250

## 2022-05-06 MED ORDER — SODIUM CHLORIDE 0.9 % IV SOLN
200.0000 mg | Freq: Once | INTRAVENOUS | Status: AC
  Administered 2022-05-06: 200 mg via INTRAVENOUS
  Filled 2022-05-06: qty 200

## 2022-05-27 ENCOUNTER — Encounter: Payer: Self-pay | Admitting: Emergency Medicine

## 2022-05-27 ENCOUNTER — Other Ambulatory Visit: Payer: Self-pay

## 2022-05-27 ENCOUNTER — Emergency Department: Payer: Medicare HMO

## 2022-05-27 ENCOUNTER — Inpatient Hospital Stay
Admission: EM | Admit: 2022-05-27 | Discharge: 2022-05-30 | DRG: 177 | Disposition: A | Discharge status: Home / Self Care | Payer: Medicare HMO | Attending: Internal Medicine | Admitting: Internal Medicine

## 2022-05-27 DIAGNOSIS — M1A9XX0 Chronic gout, unspecified, without tophus (tophi): Secondary | ICD-10-CM | POA: Diagnosis present

## 2022-05-27 DIAGNOSIS — I130 Hypertensive heart and chronic kidney disease with heart failure and stage 1 through stage 4 chronic kidney disease, or unspecified chronic kidney disease: Secondary | ICD-10-CM | POA: Diagnosis present

## 2022-05-27 DIAGNOSIS — I10 Essential (primary) hypertension: Secondary | ICD-10-CM | POA: Diagnosis present

## 2022-05-27 DIAGNOSIS — J9621 Acute and chronic respiratory failure with hypoxia: Secondary | ICD-10-CM | POA: Diagnosis present

## 2022-05-27 DIAGNOSIS — E119 Type 2 diabetes mellitus without complications: Secondary | ICD-10-CM

## 2022-05-27 DIAGNOSIS — D500 Iron deficiency anemia secondary to blood loss (chronic): Secondary | ICD-10-CM | POA: Diagnosis present

## 2022-05-27 DIAGNOSIS — I2510 Atherosclerotic heart disease of native coronary artery without angina pectoris: Secondary | ICD-10-CM | POA: Diagnosis present

## 2022-05-27 DIAGNOSIS — E663 Overweight: Secondary | ICD-10-CM | POA: Diagnosis not present

## 2022-05-27 DIAGNOSIS — Z8249 Family history of ischemic heart disease and other diseases of the circulatory system: Secondary | ICD-10-CM

## 2022-05-27 DIAGNOSIS — I272 Pulmonary hypertension, unspecified: Secondary | ICD-10-CM | POA: Diagnosis present

## 2022-05-27 DIAGNOSIS — K219 Gastro-esophageal reflux disease without esophagitis: Secondary | ICD-10-CM | POA: Diagnosis present

## 2022-05-27 DIAGNOSIS — Z91013 Allergy to seafood: Secondary | ICD-10-CM

## 2022-05-27 DIAGNOSIS — Z79899 Other long term (current) drug therapy: Secondary | ICD-10-CM | POA: Diagnosis not present

## 2022-05-27 DIAGNOSIS — N400 Enlarged prostate without lower urinary tract symptoms: Secondary | ICD-10-CM | POA: Diagnosis present

## 2022-05-27 DIAGNOSIS — Z87891 Personal history of nicotine dependence: Secondary | ICD-10-CM

## 2022-05-27 DIAGNOSIS — E785 Hyperlipidemia, unspecified: Secondary | ICD-10-CM | POA: Diagnosis present

## 2022-05-27 DIAGNOSIS — I480 Paroxysmal atrial fibrillation: Secondary | ICD-10-CM | POA: Diagnosis present

## 2022-05-27 DIAGNOSIS — U071 COVID-19: Secondary | ICD-10-CM | POA: Diagnosis present

## 2022-05-27 DIAGNOSIS — Z8616 Personal history of COVID-19: Secondary | ICD-10-CM | POA: Diagnosis not present

## 2022-05-27 DIAGNOSIS — J441 Chronic obstructive pulmonary disease with (acute) exacerbation: Principal | ICD-10-CM

## 2022-05-27 DIAGNOSIS — Z7901 Long term (current) use of anticoagulants: Secondary | ICD-10-CM

## 2022-05-27 DIAGNOSIS — N184 Chronic kidney disease, stage 4 (severe): Secondary | ICD-10-CM | POA: Diagnosis present

## 2022-05-27 DIAGNOSIS — E1151 Type 2 diabetes mellitus with diabetic peripheral angiopathy without gangrene: Secondary | ICD-10-CM | POA: Diagnosis present

## 2022-05-27 DIAGNOSIS — E1121 Type 2 diabetes mellitus with diabetic nephropathy: Secondary | ICD-10-CM

## 2022-05-27 DIAGNOSIS — I5032 Chronic diastolic (congestive) heart failure: Secondary | ICD-10-CM | POA: Diagnosis present

## 2022-05-27 DIAGNOSIS — M1A00X0 Idiopathic chronic gout, unspecified site, without tophus (tophi): Secondary | ICD-10-CM | POA: Diagnosis present

## 2022-05-27 DIAGNOSIS — J8410 Pulmonary fibrosis, unspecified: Secondary | ICD-10-CM | POA: Diagnosis present

## 2022-05-27 DIAGNOSIS — I739 Peripheral vascular disease, unspecified: Secondary | ICD-10-CM | POA: Diagnosis present

## 2022-05-27 DIAGNOSIS — D472 Monoclonal gammopathy: Secondary | ICD-10-CM | POA: Diagnosis present

## 2022-05-27 DIAGNOSIS — Z6828 Body mass index (BMI) 28.0-28.9, adult: Secondary | ICD-10-CM

## 2022-05-27 DIAGNOSIS — J440 Chronic obstructive pulmonary disease with acute lower respiratory infection: Secondary | ICD-10-CM | POA: Diagnosis present

## 2022-05-27 DIAGNOSIS — Z9981 Dependence on supplemental oxygen: Secondary | ICD-10-CM

## 2022-05-27 DIAGNOSIS — E1122 Type 2 diabetes mellitus with diabetic chronic kidney disease: Secondary | ICD-10-CM | POA: Diagnosis present

## 2022-05-27 DIAGNOSIS — J1282 Pneumonia due to coronavirus disease 2019: Secondary | ICD-10-CM | POA: Diagnosis present

## 2022-05-27 DIAGNOSIS — Z794 Long term (current) use of insulin: Secondary | ICD-10-CM

## 2022-05-27 DIAGNOSIS — Z77090 Contact with and (suspected) exposure to asbestos: Secondary | ICD-10-CM | POA: Diagnosis present

## 2022-05-27 DIAGNOSIS — D631 Anemia in chronic kidney disease: Secondary | ICD-10-CM | POA: Diagnosis present

## 2022-05-27 DIAGNOSIS — E1129 Type 2 diabetes mellitus with other diabetic kidney complication: Secondary | ICD-10-CM | POA: Diagnosis present

## 2022-05-27 DIAGNOSIS — Z7951 Long term (current) use of inhaled steroids: Secondary | ICD-10-CM

## 2022-05-27 LAB — COMPREHENSIVE METABOLIC PANEL
ALT: 21 U/L (ref 0–44)
AST: 25 U/L (ref 15–41)
Albumin: 4.1 g/dL (ref 3.5–5.0)
Alkaline Phosphatase: 37 U/L — ABNORMAL LOW (ref 38–126)
Anion gap: 9 (ref 5–15)
BUN: 34 mg/dL — ABNORMAL HIGH (ref 8–23)
CO2: 24 mmol/L (ref 22–32)
Calcium: 9.1 mg/dL (ref 8.9–10.3)
Chloride: 106 mmol/L (ref 98–111)
Creatinine, Ser: 2.67 mg/dL — ABNORMAL HIGH (ref 0.61–1.24)
GFR, Estimated: 24 mL/min — ABNORMAL LOW (ref >60)
Glucose, Bld: 226 mg/dL — ABNORMAL HIGH (ref 70–99)
Potassium: 4.7 mmol/L (ref 3.5–5.1)
Sodium: 139 mmol/L (ref 135–145)
Total Bilirubin: 1.1 mg/dL (ref 0.3–1.2)
Total Protein: 7.3 g/dL (ref 6.5–8.1)

## 2022-05-27 LAB — CBG MONITORING, ED
Glucose-Capillary: 219 mg/dL — ABNORMAL HIGH (ref 70–99)
Glucose-Capillary: 223 mg/dL — ABNORMAL HIGH (ref 70–99)
Glucose-Capillary: 258 mg/dL — ABNORMAL HIGH (ref 70–99)
Glucose-Capillary: 284 mg/dL — ABNORMAL HIGH (ref 70–99)

## 2022-05-27 LAB — CBC WITH DIFFERENTIAL/PLATELET
Abs Immature Granulocytes: 0.02 10*3/uL (ref 0.00–0.07)
Basophils Absolute: 0 10*3/uL (ref 0.0–0.1)
Basophils Relative: 1 %
Eosinophils Absolute: 0 10*3/uL (ref 0.0–0.5)
Eosinophils Relative: 0 %
HCT: 30.1 % — ABNORMAL LOW (ref 39.0–52.0)
Hemoglobin: 9.7 g/dL — ABNORMAL LOW (ref 13.0–17.0)
Immature Granulocytes: 0 %
Lymphocytes Relative: 11 %
Lymphs Abs: 0.8 10*3/uL (ref 0.7–4.0)
MCH: 30.5 pg (ref 26.0–34.0)
MCHC: 32.2 g/dL (ref 30.0–36.0)
MCV: 94.7 fL (ref 80.0–100.0)
Monocytes Absolute: 0.8 10*3/uL (ref 0.1–1.0)
Monocytes Relative: 10 %
Neutro Abs: 6 10*3/uL (ref 1.7–7.7)
Neutrophils Relative %: 78 %
Platelets: 223 10*3/uL (ref 150–400)
RBC: 3.18 MIL/uL — ABNORMAL LOW (ref 4.22–5.81)
RDW: 17.9 % — ABNORMAL HIGH (ref 11.5–15.5)
WBC: 7.7 10*3/uL (ref 4.0–10.5)
nRBC: 0 % (ref 0.0–0.2)

## 2022-05-27 LAB — BLOOD GAS, ARTERIAL
Acid-Base Excess: 2.8 mmol/L — ABNORMAL HIGH (ref 0.0–2.0)
Bicarbonate: 27.2 mmol/L (ref 20.0–28.0)
O2 Content: 10 L/min
O2 Saturation: 96.8 %
Patient temperature: 37
pCO2 arterial: 40 mmHg (ref 32–48)
pH, Arterial: 7.44 (ref 7.35–7.45)
pO2, Arterial: 73 mmHg — ABNORMAL LOW (ref 83–108)

## 2022-05-27 LAB — FERRITIN: Ferritin: 363 ng/mL — ABNORMAL HIGH (ref 24–336)

## 2022-05-27 LAB — RESP PANEL BY RT-PCR (FLU A&B, COVID) ARPGX2
Influenza A by PCR: NEGATIVE
Influenza B by PCR: NEGATIVE
SARS Coronavirus 2 by RT PCR: POSITIVE — AB

## 2022-05-27 LAB — LACTIC ACID, PLASMA
Lactic Acid, Venous: 2 mmol/L (ref 0.5–1.9)
Lactic Acid, Venous: 1.4 mmol/L (ref 0.5–1.9)

## 2022-05-27 LAB — D-DIMER, QUANTITATIVE: D-Dimer, Quant: 0.39 ug/mL-FEU (ref 0.00–0.50)

## 2022-05-27 LAB — GLUCOSE, CAPILLARY: Glucose-Capillary: 299 mg/dL — ABNORMAL HIGH (ref 70–99)

## 2022-05-27 LAB — BRAIN NATRIURETIC PEPTIDE: B Natriuretic Peptide: 335.5 pg/mL — ABNORMAL HIGH (ref 0.0–100.0)

## 2022-05-27 LAB — PROCALCITONIN: Procalcitonin: <0.1 ng/mL

## 2022-05-27 MED ORDER — ATORVASTATIN CALCIUM 20 MG PO TABS
20.0000 mg | ORAL_TABLET | Freq: Every day | ORAL | Status: DC
  Administered 2022-05-27 – 2022-05-29 (×3): 20 mg via ORAL
  Filled 2022-05-27 (×3): qty 1

## 2022-05-27 MED ORDER — SILDENAFIL CITRATE 20 MG PO TABS
20.0000 mg | ORAL_TABLET | Freq: Three times a day (TID) | ORAL | Status: DC
  Administered 2022-05-27 – 2022-05-30 (×9): 20 mg via ORAL
  Filled 2022-05-27 (×11): qty 1

## 2022-05-27 MED ORDER — ACETAMINOPHEN 650 MG RE SUPP: 650.0000 mg | Freq: Four times a day (QID) | RECTAL | Status: DC | PRN

## 2022-05-27 MED ORDER — SODIUM CHLORIDE 0.9 % IV SOLN
200.0000 mg | Freq: Once | INTRAVENOUS | Status: AC
  Administered 2022-05-27: 200 mg via INTRAVENOUS
  Filled 2022-05-27: qty 40

## 2022-05-27 MED ORDER — FUROSEMIDE 40 MG PO TABS
40.0000 mg | ORAL_TABLET | Freq: Every day | ORAL | Status: DC
  Administered 2022-05-27 – 2022-05-30 (×4): 40 mg via ORAL
  Filled 2022-05-27 (×4): qty 1

## 2022-05-27 MED ORDER — ZINC SULFATE 220 (50 ZN) MG PO CAPS
220.0000 mg | ORAL_CAPSULE | Freq: Every day | ORAL | Status: DC
  Administered 2022-05-27 – 2022-05-30 (×4): 220 mg via ORAL
  Filled 2022-05-27 (×4): qty 1

## 2022-05-27 MED ORDER — SODIUM CHLORIDE 0.9 % IV SOLN
100.0000 mg | Freq: Every day | INTRAVENOUS | Status: AC
  Administered 2022-05-28 – 2022-05-29 (×2): 100 mg via INTRAVENOUS
  Filled 2022-05-27 (×2): qty 100

## 2022-05-27 MED ORDER — ONDANSETRON HCL 4 MG/2ML IJ SOLN: 4.0000 mg | Freq: Four times a day (QID) | INTRAMUSCULAR | Status: DC | PRN

## 2022-05-27 MED ORDER — IPRATROPIUM-ALBUTEROL 0.5-2.5 (3) MG/3ML IN SOLN
3.0000 mL | Freq: Four times a day (QID) | RESPIRATORY_TRACT | Status: DC
  Administered 2022-05-27 – 2022-05-28 (×4): 3 mL via RESPIRATORY_TRACT
  Filled 2022-05-27 (×4): qty 3

## 2022-05-27 MED ORDER — METOPROLOL TARTRATE 50 MG PO TABS
50.0000 mg | ORAL_TABLET | Freq: Two times a day (BID) | ORAL | Status: DC
  Administered 2022-05-27 – 2022-05-30 (×7): 50 mg via ORAL
  Filled 2022-05-27: qty 2
  Filled 2022-05-27 (×6): qty 1

## 2022-05-27 MED ORDER — INSULIN DETEMIR 100 UNIT/ML ~~LOC~~ SOLN
0.0750 [IU]/kg | Freq: Two times a day (BID) | SUBCUTANEOUS | Status: DC
  Administered 2022-05-27 – 2022-05-30 (×7): 6 [IU] via SUBCUTANEOUS
  Filled 2022-05-27 (×8): qty 0.06

## 2022-05-27 MED ORDER — GUAIFENESIN-DM 100-10 MG/5ML PO SYRP: 10.0000 mL | ORAL_SOLUTION | ORAL | Status: DC | PRN

## 2022-05-27 MED ORDER — PANTOPRAZOLE SODIUM 40 MG PO TBEC
40.0000 mg | DELAYED_RELEASE_TABLET | Freq: Two times a day (BID) | ORAL | Status: DC
  Administered 2022-05-27 – 2022-05-30 (×6): 40 mg via ORAL
  Filled 2022-05-27 (×7): qty 1

## 2022-05-27 MED ORDER — MAGNESIUM OXIDE 400 MG PO TABS
400.0000 mg | ORAL_TABLET | Freq: Three times a day (TID) | ORAL | Status: DC
  Administered 2022-05-27 – 2022-05-30 (×9): 400 mg via ORAL
  Filled 2022-05-27 (×18): qty 1

## 2022-05-27 MED ORDER — INSULIN ASPART 100 UNIT/ML IJ SOLN
0.0000 [IU] | INTRAMUSCULAR | Status: DC
  Administered 2022-05-27 (×2): 11 [IU] via SUBCUTANEOUS
  Administered 2022-05-27 (×2): 7 [IU] via SUBCUTANEOUS
  Administered 2022-05-28: 15 [IU] via SUBCUTANEOUS
  Administered 2022-05-28: 4 [IU] via SUBCUTANEOUS
  Administered 2022-05-28: 7 [IU] via SUBCUTANEOUS
  Administered 2022-05-28: 4 [IU] via SUBCUTANEOUS
  Administered 2022-05-28: 11 [IU] via SUBCUTANEOUS
  Administered 2022-05-28: 15 [IU] via SUBCUTANEOUS
  Administered 2022-05-28: 7 [IU] via SUBCUTANEOUS
  Administered 2022-05-29: 4 [IU] via SUBCUTANEOUS
  Administered 2022-05-29: 7 [IU] via SUBCUTANEOUS
  Administered 2022-05-29: 11 [IU] via SUBCUTANEOUS
  Filled 2022-05-27 (×14): qty 1

## 2022-05-27 MED ORDER — IPRATROPIUM-ALBUTEROL 0.5-2.5 (3) MG/3ML IN SOLN
3.0000 mL | Freq: Once | RESPIRATORY_TRACT | Status: AC
  Administered 2022-05-27: 3 mL via RESPIRATORY_TRACT
  Filled 2022-05-27: qty 3

## 2022-05-27 MED ORDER — VITAMIN C 500 MG PO TABS
500.0000 mg | ORAL_TABLET | Freq: Every day | ORAL | Status: DC
  Administered 2022-05-27 – 2022-05-30 (×4): 500 mg via ORAL
  Filled 2022-05-27 (×4): qty 1

## 2022-05-27 MED ORDER — VITAMIN B-12 1000 MCG PO TABS
1000.0000 ug | ORAL_TABLET | Freq: Every day | ORAL | Status: DC
  Administered 2022-05-28 – 2022-05-30 (×3): 1000 ug via ORAL
  Filled 2022-05-27 (×3): qty 1

## 2022-05-27 MED ORDER — ALLOPURINOL 100 MG PO TABS
200.0000 mg | ORAL_TABLET | Freq: Every day | ORAL | Status: DC
  Administered 2022-05-28 – 2022-05-30 (×3): 200 mg via ORAL
  Filled 2022-05-27 (×3): qty 2

## 2022-05-27 MED ORDER — APIXABAN 5 MG PO TABS
5.0000 mg | ORAL_TABLET | Freq: Two times a day (BID) | ORAL | Status: DC
  Administered 2022-05-27 – 2022-05-30 (×7): 5 mg via ORAL
  Filled 2022-05-27 (×7): qty 1

## 2022-05-27 MED ORDER — ADULT MULTIVITAMIN W/MINERALS CH
1.0000 | ORAL_TABLET | Freq: Every day | ORAL | Status: DC
  Administered 2022-05-27 – 2022-05-30 (×4): 1 via ORAL
  Filled 2022-05-27 (×4): qty 1

## 2022-05-27 MED ORDER — LISINOPRIL 5 MG PO TABS
5.0000 mg | ORAL_TABLET | Freq: Every day | ORAL | Status: DC
  Administered 2022-05-27 – 2022-05-30 (×4): 5 mg via ORAL
  Filled 2022-05-27 (×4): qty 1

## 2022-05-27 MED ORDER — ALBUTEROL SULFATE (2.5 MG/3ML) 0.083% IN NEBU: 2.5000 mg | INHALATION_SOLUTION | RESPIRATORY_TRACT | Status: DC | PRN

## 2022-05-27 MED ORDER — LINAGLIPTIN 5 MG PO TABS
5.0000 mg | ORAL_TABLET | Freq: Every day | ORAL | Status: DC
  Administered 2022-05-27 – 2022-05-30 (×4): 5 mg via ORAL
  Filled 2022-05-27 (×4): qty 1

## 2022-05-27 MED ORDER — DEXAMETHASONE SODIUM PHOSPHATE 10 MG/ML IJ SOLN
6.0000 mg | INTRAMUSCULAR | Status: DC
  Administered 2022-05-27 – 2022-05-29 (×3): 6 mg via INTRAVENOUS
  Filled 2022-05-27: qty 0.6
  Filled 2022-05-27 (×2): qty 1

## 2022-05-27 MED ORDER — ONDANSETRON HCL 4 MG PO TABS: 4.0000 mg | ORAL_TABLET | Freq: Four times a day (QID) | ORAL | Status: DC | PRN

## 2022-05-27 MED ORDER — ACETAMINOPHEN 325 MG PO TABS
650.0000 mg | ORAL_TABLET | Freq: Four times a day (QID) | ORAL | Status: DC | PRN
  Administered 2022-05-27 – 2022-05-28 (×2): 650 mg via ORAL
  Filled 2022-05-27 (×2): qty 2

## 2022-05-27 MED ORDER — FINASTERIDE 5 MG PO TABS
5.0000 mg | ORAL_TABLET | Freq: Every day | ORAL | Status: DC
  Administered 2022-05-28 – 2022-05-30 (×3): 5 mg via ORAL
  Filled 2022-05-27 (×3): qty 1

## 2022-05-27 NOTE — ED Notes (Signed)
Pt cathed by this RN. Pt states they normally self-cath at home daily. Pt resting comfortably at this time. No acute distress noted. Daughter at bedside.

## 2022-05-27 NOTE — ED Notes (Signed)
Patient's O2 titrated down to 8L NRB. RT called at this time to place patient on HFNC at patient's request so that he is able to eat lunch.

## 2022-05-27 NOTE — ED Provider Notes (Signed)
[facility] Provider Note    None    (approximate)   History   Generalized Body Aches and Shortness of Breath   HPI  Jay Morris is a 76 y.o. male brought to the ED via EMS from home with a chief complaint of shortness of breath.  Patient with a history of COPD, CHF on baseline 5 L nasal cannula oxygen.  Endorses productive cough and shortness of breath x2 days.  Endorses associated body aches and nausea with intermittent fevers.  Placed on nonrebreather mask per EMS with improvement in symptoms.  Received 4 mg IV Zofran for nausea.  Denies chest pain, abdominal pain, vomiting or diarrhea.     Past Medical History   Past Medical History:  Diagnosis Date   (HFpEF) heart failure with preserved ejection fraction ([place])    a.) TTE 04/01/2016: EF 60-65%, LAE, MAC, mild MR/AR/TR, mild-mod PR, G2DD; b.) TTE 12/18/2018: EF 55%, mild LVH, mild AR/MR/TR. mod PR; c.) TTE 05/31/2019: EF 60-65%, mild LVH, RAE/RVE, triv AR.MT, PR, mild TR; d.) TTE 10/26/2019: EF 55%, BAE, RVE, triv AR, mild MR/PR, sev TR, G2DD; e.) R/LHC 01/17/2020: mPA 52, PCWP 25, LVEDP 19, RVEDP 25, mRA 13   Anemia    Aortic atherosclerosis (HCC)    Asthma    BPH (benign prostatic hyperplasia)    CKD (chronic kidney disease), stage III (HCC)    COPD (chronic obstructive pulmonary disease) ([place])    Coronary artery disease 01/17/2020   a.) LHC 01/17/2020: 25% oLAD, 65% o-pRCA, 45% pLAD, 65% mLAD, 35% OM2 --> med mgmt.   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   PAF (paroxysmal atrial fibrillation) (HCC)    a.) CHA2DS2-VASc  = 6 (age x 2, HFpEF, HTN, vascular disease, T2DM);  b.) cardiac ablation in approx 2014-2015 at [facility]; c.) A.fib recurrence in setting of SARS-CoV-2 --> Tx'd with IV amiodarone + metoprolol + DCCV x 1 (200 J) 06/22/2019 restoring NSR; d.) rate/rhythm maintained without pharmacological intervention; chronically anticoagulated using standard dose apixaban   Pneumonia due to COVID-19 virus 06/02/2019   a.) hospitalized at [facility]) from 06/02/2019 - 06/25/2019; Tx'd with steriods + remdesivir + convalescent plasma   Pulmonary fibrosis (HCC)    Pulmonary HTN ([place]) 04/01/2016   a.) TTE 04/01/2016: EF 60-65%, RVSP 40; b.) TTE 12/18/2018: EF 55%, RVSP 45.9; c.) TTE 05/31/2019: EF 60-65%, RVSP 35.5; d.) TTE 10/26/2019: EF 55%, RVSP 100.4; e.) R/LHC 01/17/2020: mPA 52, PCWP 25, LVEDP 19, RVEDP 25, mRA 13; f.) Tx with PDE5i (sildenafil) + vasodilator (treprostinil)   Type 2 diabetes mellitus treated with insulin [facility])      Active Problem List   Patient Active Problem List   Diagnosis Date Noted   Sepsis due to pneumonia ([place]) 02/13/2022   BPH (benign prostatic hyperplasia) 02/13/2022   Chronic obstructive pulmonary disease (COPD) ([place]) 02/13/2022   GERD without esophagitis 02/13/2022   Anemia in stage 4 chronic kidney disease ([place]) 12/22/2021   GI bleeding 08/03/2021   Chronic diastolic CHF (congestive heart failure) ([place]) 08/03/2021   HTN (  hypertension) 08/03/2021   Atrial fibrillation, chronic (HCC) 08/03/2021   CKD (chronic kidney disease), stage IV ([place]) 08/03/2021   CAD (coronary artery disease) 08/03/2021   Leukocytosis 08/03/2021   Gout 01/08/2021   Hearing loss 01/08/2021   History of severe acute respiratory syndrome coronavirus 2 (SARS-CoV-2) disease 01/08/2021   Vitamin D deficiency 01/08/2021   Benign prostatic hyperplasia 12/31/2020   Anemia in chronic kidney disease 12/31/2020   Heart failure, unspecified ([place]) 12/31/2020   CKD (chronic kidney disease) stage 3,  GFR 30-59 ml/min (HCC) 01/28/2020   Acute on chronic systolic heart failure ([place]) 01/16/2020   Pulmonary hypertension, moderate to severe ([place]) 11/26/2019   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/17/2019   Anemia, unspecified 09/01/2019   Acute on chronic diastolic CHF (congestive heart failure), NYHA class 3 ([place]) 08/16/2019   PVD (peripheral vascular disease) ([place]) 06/03/2019   Hepatic steatosis 06/03/2019   Acute renal failure superimposed on stage 3a chronic kidney disease ([place]) 06/01/2019   COPD with acute exacerbation ([place]) 06/01/2019   Muscle weakness (generalized) 06/01/2019   COVID-19 virus infection 06/01/2019   AKI (acute kidney injury) ([place]) 05/31/2019   Acute respiratory failure with hypoxia ([place])    Vitamin B12 deficiency 05/08/2018   Type II diabetes mellitus with renal manifestations ([place]) 02/08/2018   At risk for bleeding 12/15/2017   Hypotension 12/01/2017   Iron deficiency anemia due to chronic blood loss    GI bleed 11/21/2017   Coronary artery disease involving native coronary artery of native heart 11/16/2017   Uncontrolled type 2 diabetes mellitus with hyperglycemia, with long-term current use of insulin ([place]) 11/24/2016   Chronic gouty arthritis 04/16/2016   COPD, moderate ([place]) 02/09/2016   Pneumonia 01/04/2016   Degeneration of lumbar intervertebral disc 10/28/2015   Paroxysmal atrial fibrillation with RVR ([place]) 05/21/2014   Hyperlipidemia 05/21/2014     Past Surgical History   Past Surgical History:  Procedure Laterality Date   BACK SURGERY     x3 L3-L4    CARDIAC ELECTROPHYSIOLOGY MAPPING AND ABLATION N/A    performed at [facility] in approximately 2014-2015   CARDIOVERSION N/A 06/22/2019   Procedure: CARDIOVERSION;  Surgeon: Fay Records, MD;  Location: [facility];  Service: Cardiovascular;  Laterality: N/A;   COLONOSCOPY WITH PROPOFOL N/A 06/15/2017   Procedure: COLONOSCOPY WITH PROPOFOL;  Surgeon: Toledo, Benay Pike, MD;  Location:  [facility] ENDOSCOPY;  Service: Gastroenterology;  Laterality: N/A;   ENTEROSCOPY N/A 11/24/2017   Procedure: ENTEROSCOPY;  Surgeon: Jonathon Bellows, MD;  Location: [facility] ENDOSCOPY;  Service: Gastroenterology;  Laterality: N/A;   ESOPHAGOGASTRODUODENOSCOPY N/A 11/27/2017   Procedure: ESOPHAGOGASTRODUODENOSCOPY (EGD);  Surgeon: Lucilla Lame, MD;  Location[facility] END[facility]COPY;  Service: Endoscopy;  Laterality: N/A;   ESOPHAGOGASTRODUODENOSCOPY (EGD) WITH PROPOFOL N/A 06/02/2017   Procedure: ESOPHAGOGASTRODUODENOSCOPY (EGD) WITH PROPOFOL;  Surgeon: Toledo, Benay Pike, MD;  Location: [facility] ENDOSCOPY;  Service: Gastroenterology;  Laterality: N/A;   ESOPHAGOGASTRODUODENOSCOPY (EGD) WITH PROPOFOL N/A 08/04/2021   Procedure: ESOPHAGOGASTRODUODENOSCOPY (EGD) WITH PROPOFOL;  Surgeon: Lesly Rubenstein, MD;  Location: [facility] ENDOSCOPY;  Service: Endoscopy;  Laterality: N/A;   EVALUATION UNDER ANESTHESIA WITH HEMORRHOIDECTOMY N/A 02/12/2022   Procedure: EXAM UNDER ANESTHESIA WITH HEMORRHOIDECTOMY;  Surgeon: Benjamine Sprague, DO;  Location: [facility] ORS;  Service: General;  Laterality: N/A;   GIVENS CAPSULE STUDY N/A 11/25/2017   Procedure: GIVENS CAPSULE STUDY;  Surgeon: Jonathon Bellows, MD;  Location: [facility] ENDOSCOPY;  Service: Gastroenterology;  Laterality: N/A;   HAND SURGERY     RIGHT/LEFT HEART CATH AND CORONARY ANGIOGRAPHY  N/A 01/17/2020   Procedure: RIGHT/LEFT HEART CATH AND CORONARY ANGIOGRAPHY;  Surgeon: Corey Skains, MD;  Location: [facility] CV LAB;  Service: Cardiovascular;  Laterality: N/A;     Home Medications   Prior to Admission medications   Medication Sig Start Date End Date Taking? Authorizing Provider  acetaminophen (TYLENOL) 500 MG tablet Take 500 mg by mouth every 6 (six) hours as needed for moderate pain or headache. 08/07/21   Val Riles, MD  albuterol (VENTOLIN HFA) 108 (90 Base) MCG/ACT inhaler Inhale 2 puffs into the lungs daily.    [provider]  allopurinol (ZYLOPRIM) 100 MG tablet  Take 2 tablets by mouth daily. 06/25/21   [provider]  ascorbic acid (VITAMIN C) 500 MG tablet Take 500 mg by mouth daily.    [provider]  atorvastatin (LIPITOR) 20 MG tablet Take 20 mg by mouth at bedtime.     [provider]  BREO ELLIPTA 100-25 MCG/INH AEPB Inhale 1 puff into the lungs daily. 12/04/15   [provider]  Cholecalciferol (DIALYVITE VITAMIN D 5000) 125 MCG (5000 UT) capsule Take 5,000 Units by mouth daily.    [provider]  ELIQUIS 5 MG TABS tablet SMARTSIG:1 Tablet(s) By Mouth Every 12 Hours 01/21/20   [provider]  finasteride (PROSCAR) 5 MG tablet TAKE 1 TABLET BY MOUTH DAILY 08/10/21   McGowan, Larene Beach A, PA-C  furosemide (LASIX) 80 MG tablet Take 40 mg by mouth daily.    [provider]  guaiFENesin (MUCINEX) 600 MG 12 hr tablet Take 1 tablet (600 mg total) by mouth 2 (two) times daily. 02/19/22   Shawna Clamp, MD  insulin aspart (NOVOLOG) 100 UNIT/ML FlexPen Inject 20-26 Units into the skin 3 (three) times daily with meals. 20 units am 26 units noon, 26 units evening 08/27/19   [provider]  insulin degludec (TRESIBA FLEXTOUCH) 200 UNIT/ML FlexTouch Pen Inject 60 Units into the skin in the morning and at bedtime.    [provider]  lidocaine (XYLOCAINE) 5 % ointment Apply 1 Application topically 3 (three) times daily as needed. Apply to affected area as needed 3 times daily 02/12/22   Lysle Pearl, Isami, DO  lisinopril (ZESTRIL) 20 MG tablet Take 10 mg by mouth at bedtime.     [provider]  magnesium oxide (MAG-OX) 400 MG tablet Take 1 tablet by mouth 3 (three) times daily. 07/23/20   [provider]  metoprolol tartrate (LOPRESSOR) 25 MG tablet Take 1 tablet (25 mg total) by mouth 2 (two) times daily. 02/19/22   Shawna Clamp, MD  nystatin-triamcinolone ointment [facility]) Apply 1 Application topically 2 (two) times daily. Do not use for longer than 4-6 weeks. 02/01/22    Vaillancourt, Aldona Bar, PA-C  oxyCODONE (ROXICODONE) 5 MG immediate release tablet Take 1 tablet (5 mg total) by mouth every 8 (eight) hours as needed. 02/12/22 02/12/23  Lysle Pearl, Isami, DO  pantoprazole (PROTONIX) 40 MG tablet Take 1 tablet (40 mg total) by mouth 2 (two) times daily before a meal. 06/25/19   Aline August, MD  sildenafil (REVATIO) 20 MG tablet Take 20 mg by mouth 3 (three) times daily.    [provider]  TYVASO REFILL 0.6 MG/ML SOLN Inhale 18 mcg into the lungs in the morning, at noon, in the evening, and at bedtime. 08/07/21   Val Riles, MD  vitamin B-12 (CYANOCOBALAMIN) 1000 MCG tablet Take 1,000 mcg by mouth daily.    [provider]     Allergies  Shrimp [shellfish allergy] and Shrimp extract allergy skin test   Family History   Family History  Problem Relation Age of Onset   Heart disease Mother    Cancer Father    Cancer Paternal Grandfather      Physical Exam  Triage Vital Signs: ED Triage Vitals  Enc Vitals Group     BP 05/27/22 0605 (!) 172/72     Pulse Rate 05/27/22 0605 (!) 108     Resp 05/27/22 0605 20     Temp 05/27/22 0605 100.2 F (37.9 C)     Temp Source 05/27/22 0605 Oral     SpO2 05/27/22 0605 98 %     Weight 05/27/22 0605 186 lb (84.4 kg)     Height 05/27/22 0605 _0  (1.727 m)     Head Circumference --      Peak Flow --      Pain Score 05/27/22 0607 0     Pain Loc --      Pain Edu? --      Excl. in [place]? --     Updated Vital Signs: BP (!) 150/57 (BP Location: Left Arm)   Pulse 97   Temp 100.2 F (37.9 C) (Oral)   Resp 20   Ht _1  (1.727 m)   Wt 84.4 kg   SpO2 95%   BMI 28.28 kg/m    General: Awake, mild to moderate distress.  CV:  Tachycardic.  Good peripheral perfusion.  Resp:  Normal effort.  Scattered rales and rhonchi. Abd:  Nontender.  No distention.  Other:  No pedal edema.  Bilateral calves are nontender and nonswollen.   ED Results / Procedures / Treatments  Labs (all labs ordered are  listed, but only abnormal results are displayed) Labs Reviewed  LACTIC ACID, PLASMA - Abnormal; Notable for the following components:      Result Value   Lactic Acid, Venous 2.0 (*)    All other components within normal limits  CBC WITH DIFFERENTIAL/PLATELET - Abnormal; Notable for the following components:   RBC 3.18 (*)    Hemoglobin 9.7 (*)    HCT 30.1 (*)    RDW 17.9 (*)    All other components within normal limits  COMPREHENSIVE METABOLIC PANEL - Abnormal; Notable for the following components:   Glucose, Bld 226 (*)    BUN 34 (*)    Creatinine, Ser 2.67 (*)    Alkaline Phosphatase 37 (*)    GFR, Estimated 24 (*)    All other components within normal limits  BLOOD GAS, ARTERIAL - Abnormal; Notable for the following components:   pO2, Arterial 73 (*)    Acid-Base Excess 2.8 (*)    All other components within normal limits  CULTURE, BLOOD (ROUTINE X 2)  CULTURE, BLOOD (ROUTINE X 2)  RESP PANEL BY RT-PCR (FLU A&B, COVID) ARPGX2  LACTIC ACID, PLASMA  PROCALCITONIN     EKG  ED ECG REPORT I, Breton Berns J, the attending physician, personally viewed and interpreted this ECG.   Date: 05/27/2022  EKG Time: 0605  Rate: 101  Rhythm: sinus tachycardia  Axis: Normal  Intervals:none  ST&T Change: Nonspecific    RADIOLOGY I have independently visualized and interpreted patient's chest x-ray:  X-ray: Pending  Official radiology report(s): No results found.   PROCEDURES:  Critical Care performed: Yes, see critical care procedure note(s)  CRITICAL CARE Performed by: Paulette Blanch   Total critical care time: 45 minutes  Critical care time was exclusive of separately billable  procedures and treating other patients.  Critical care was necessary to treat or prevent imminent or life-threatening deterioration.  Critical care was time spent personally by me on the following activities: development of treatment plan with patient and/or surrogate as well as nursing,  discussions with consultants, evaluation of patient's response to treatment, examination of patient, obtaining history from patient or surrogate, ordering and performing treatments and interventions, ordering and review of laboratory studies, ordering and review of radiographic studies, pulse oximetry and re-evaluation of patient's condition.   Marland Kitchen1-3 Lead EKG Interpretation  Performed by: Paulette Blanch, MD Authorized by: Paulette Blanch, MD     Interpretation: abnormal     ECG rate:  105   ECG rate assessment: tachycardic     Rhythm: sinus tachycardia     Ectopy: none     Conduction: normal   Comments:     Patient placed on cardiac monitor to evaluate for arrhythmias    MEDICATIONS ORDERED IN ED: Medications  ipratropium-albuterol (DUONEB) 0.5-2.5 (3) MG/3ML nebulizer solution 3 mL (has no administration in time range)     IMPRESSION / MDM / ASSESSMENT AND PLAN / ED COURSE  I reviewed the triage vital signs and the nursing notes.                             75 year old male presenting with shortness of breath. Differential includes, but is not limited to, viral syndrome, bronchitis including COPD exacerbation, pneumonia, reactive airway disease including asthma, CHF including exacerbation with or without pulmonary/interstitial edema, pneumothorax, ACS, thoracic trauma, and pulmonary embolism.  I have personally reviewed patient's records and notes a recent PCP office visit for BPH, CHF on 05/18/2022.  Patient's presentation is most consistent with acute presentation with potential threat to life or bodily function.  The patient is on the cardiac monitor to evaluate for evidence of arrhythmia and/or significant heart rate changes.  Patient with low-grade temperature.  Will obtain ED code sepsis lab work, chest x-ray.  Administer DuoNeb.  Anticipate hospitalization for increased oxygen requirement.  Clinical Course as of 05/27/22 0704  Thu May 27, 2022  0704 Care transferred to Dr.  Joni Fears at change of shift pending labwork, cxr and admission. [JS]    Clinical Course User Index [JS] Paulette Blanch, MD     FINAL CLINICAL IMPRESSION(S) / ED DIAGNOSES   Final diagnoses:  COPD exacerbation ([place])  Flu-like symptoms  Dyspnea, unspecified type  Hypoxia     Rx / DC Orders   ED Discharge Orders     None        Note:  This document was prepared using Dragon voice recognition software and may include unintentional dictation errors.   Paulette Blanch, MD 05/27/22 [phone]

## 2022-05-27 NOTE — Progress Notes (Signed)
Remdesivir - Pharmacy Brief Note   Chief complaint of SOB with associated flu-like sx's for last 3 days with intermittent fevers.   O:  ALT: 21 CXR: increased interstitial opacities SpO2: 95% on 10 L/min non-rebreather mask  (on 5L chronically)    A/P:  Remdesivir 200 mg IVPB once followed by 100 mg IVPB daily x 4 days.    Glean Salvo, PharmD Clinical Pharmacist  05/27/2022 8:48 AM

## 2022-05-27 NOTE — ED Notes (Signed)
Informed RN bed assigned 

## 2022-05-27 NOTE — ED Provider Notes (Signed)
Procedures  Clinical Course as of 05/27/22 0819  Thu May 27, 2022  0704 Care transferred to Dr. Joni Fears at change of shift pending labwork, cxr and admission. [JS]    Clinical Course User Index [JS] Paulette Blanch, MD    ----------------------------------------- 8:19 AM on 05/27/2022 -----------------------------------------  Chest x-ray shows increased interstitial opacities.  COVID-positive.  Increased oxygen requirement, consistent with COVID pneumonitis.  We will start remdesivir per pharmacy consult.  Plan to admit for acute on chronic hypoxic respiratory failure and COPD exacerbation.Carrie Mew, MD 05/27/22 [phone]

## 2022-05-27 NOTE — ED Notes (Addendum)
Attempted to place the patient back on his baseline 5L [place]; pt desat down to 87%; pt returned to [place] made aware.

## 2022-05-27 NOTE — ED Triage Notes (Signed)
Pt presents via EMS with complaints of SOB with associated flu-like sx for the last 3 days with intermittent fevers. Pt has COPD and wears 5L chronically - pt placed on 10L Non-rebreather mask and noted improvement. Pt received 28m of zofran via EMS PTA. Denies CP .

## 2022-05-27 NOTE — Progress Notes (Signed)
CROSS COVER NOTE  NAME: Jay Morris MRN: [id number] DOB : 10/27/1945    Time of Service   2057  HPI/Events of Note   Informed by RN patient  self caths at hom  Assessment and  Interventions   Assessment:  Plan: PRN in and out cath order placed

## 2022-05-27 NOTE — ED Notes (Signed)
Pt place on HF nasal cannula at this time.

## 2022-05-27 NOTE — H&P (Signed)
History and Physical    Patient: Jay Morris GEZ:[id number] DOB: 04/24/1946 DOA: 05/27/2022 DOS: the patient was seen and examined on 05/27/2022 PCP: [name], MD  Patient coming from: Home  Chief Complaint:  Chief Complaint  Patient presents with   Generalized Body Aches   Shortness of Breath   HPI: Jay Morris is a 76 y.o. male with medical history significant of diastolic dysfunction, anemia, CAD, aortic atherosclerosis, hypertension, paroxysmal atrial fibrillation on apixaban, pulmonary hypertension, BPH, stage IV CKD, lumbar DDD, GERD, GI bleed,, H. pylori gastritis, hemorrhoids, gout, asbestos Bowsher, hyperlipidemia, MGUS, asthma/COPD, history of COVID pneumonitis, pulmonary fibrosis is coming to the hospital with a 3-day history of dyspnea, intermittent fevers, sore throat, runny nose, myalgias, fatigue and malaise.  His appetite is decreased.  No hemoptysis.  Chest wall pleuritic pain, but no precordial chest pain, palpitations, diaphoresis, PND, orthopnea or pitting edema of the lower extremities.  No abdominal pain, nausea, emesis, diarrhea, constipation, melena or hematochezia.  No flank pain, dysuria, frequency or hematuria.  No polyuria, polydipsia, polyphagia or blurred vision.   ED course: Initial vital signs were temperature 100.2 F, pulse 108, respiration 20, BP 172/72 mmHg and O2 sat 98% on NRB oxygen at 10 LPM.  The patient received a DuoNeb and was placed on NRB oxygen.  He was started on remdesivir per pharmacy.  Lab work: Coronavirus PCR was positive and influenza PCR was negative.  Lactic acid is 2.0 and 1.4 mmol/L.  Procalcitonin was normal.  Arterial blood gas with normal pH, PCO2 and bicarbonate.  Decreased PO2 of 73 mmHg while on 10 L.  Acid-base excess was 2.9 mmol/L.  CBC with a white count of 7.7 with 78% neutrophils, hemoglobin 9.7 g/dL and platelets 223.  D-dimer was 0.39.  CMP with a glucose of 226, BUN 34 and creatinine 2.67 mg/dL.  Imaging:  Cardiomegaly.  Marked chronic interstitial lung disease.  No new or acute cardiopulmonary findings.   Review of Systems: As mentioned in the history of present illness. All other systems reviewed and are negative. Past Medical History:  Diagnosis Date   (HFpEF) heart failure with preserved ejection fraction ([place])    a.) TTE 04/01/2016: EF 60-65%, LAE, MAC, mild MR/AR/TR, mild-mod PR, G2DD; b.) TTE 12/18/2018: EF 55%, mild LVH, mild AR/MR/TR. mod PR; c.) TTE 05/31/2019: EF 60-65%, mild LVH, RAE/RVE, triv AR.MT, PR, mild TR; d.) TTE 10/26/2019: EF 55%, BAE, RVE, triv AR, mild MR/PR, sev TR, G2DD; e.) R/LHC 01/17/2020: mPA 52, PCWP 25, LVEDP 19, RVEDP 25, mRA 13   Anemia    Aortic atherosclerosis (HCC)    Asthma    BPH (benign prostatic hyperplasia)    CKD (chronic kidney disease), stage III (HCC)    COPD (chronic obstructive pulmonary disease) ([place])    Coronary artery disease 01/17/2020   a.) LHC 01/17/2020: 25% oLAD, 65% o-pRCA, 45% pLAD, 65% mLAD, 35% OM2 --> med mgmt.   DDD (degenerative disc disease), lumbar    GERD (gastroesophageal reflux disease)    GI bleed 65/0354   Gout    Helicobacter pylori gastritis    Hemorrhoids    History of 2019 novel coronavirus disease (COVID-19) 05/30/2019   History of asbestos exposure    Hyperlipidemia    Hypertension    Long term current use of anticoagulant    a.) apixaban   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/03/2019   PAF (paroxysmal atrial fibrillation) (HCC)    a.) CHA2DS2-VASc = 6 (age x 2, HFpEF, HTN, vascular  disease, T2DM);  b.) cardiac ablation in approx 2014-2015 at [facility]; c.) A.fib recurrence in setting of SARS-CoV-2 --> Tx'd with IV amiodarone + metoprolol + DCCV x 1 (200 J) 06/22/2019 restoring NSR; d.) rate/rhythm maintained without pharmacological intervention; chronically anticoagulated using standard dose apixaban   Pneumonia due to COVID-19 virus 06/02/2019   a.) hospitalized at [facility])  from 06/02/2019 - 06/25/2019; Tx'd with steriods + remdesivir + convalescent plasma   Pulmonary fibrosis (HCC)    Pulmonary HTN ([place]) 04/01/2016   a.) TTE 04/01/2016: EF 60-65%, RVSP 40; b.) TTE 12/18/2018: EF 55%, RVSP 45.9; c.) TTE 05/31/2019: EF 60-65%, RVSP 35.5; d.) TTE 10/26/2019: EF 55%, RVSP 100.4; e.) R/LHC 01/17/2020: mPA 52, PCWP 25, LVEDP 19, RVEDP 25, mRA 13; f.) Tx with PDE5i (sildenafil) + vasodilator (treprostinil)   Type 2 diabetes mellitus treated with insulin ([place])    Past Surgical History:  Procedure Laterality Date   BACK SURGERY     x3 L3-L4    CARDIAC ELECTROPHYSIOLOGY MAPPING AND ABLATION N/A    performed at [place] in approximately 2014-2015   CARDIOVERSION N/A 06/22/2019   Procedure: CARDIOVERSION;  Surgeon: Fay Records, MD;  Location: [place];  Service: Cardiovascular;  Laterality: N/A;   COLONOSCOPY WITH PROPOFOL N/A 06/15/2017   Procedure: COLONOSCOPY WITH PROPOFOL;  Surgeon: Toledo, Benay Pike, MD;  Location: [facility] ENDOSCOPY;  Service: Gastroenterology;  Laterality: N/A;   ENTEROSCOPY N/A 11/24/2017   Procedure: ENTEROSCOPY;  Surgeon: Jonathon Bellows, MD;  Location: [facility] ENDOSCOPY;  Service: Gastroenterology;  Laterality: N/A;   ESOPHAGOGASTRODUODENOSCOPY N/A 11/27/2017   Procedure: ESOPHAGOGASTRODUODENOSCOPY (EGD);  Surgeon: Lucilla Lame, MD;  Location: [facility] ENDOSCOPY;  Service: Endoscopy;  Laterality: N/A;   ESOPHAGOGASTRODUODENOSCOPY (EGD) WITH PROPOFOL N/A 06/02/2017   Procedure: ESOPHAGOGASTRODUODENOSCOPY (EGD) WITH PROPOFOL;  Surgeon: Toledo, Benay Pike, MD;  Location: [facility] ENDOSCOPY;  Service: Gastroenterology;  Laterality: N/A;   ESOPHAGOGASTRODUODENOSCOPY (EGD) WITH PROPOFOL N/A 08/04/2021   Procedure: ESOPHAGOGASTRODUODENOSCOPY (EGD) WITH PROPOFOL;  Surgeon: Lesly Rubenstein, MD;  Location: [facility] ENDOSCOPY;  Service: Endoscopy;  Laterality: N/A;   EVALUATION UNDER ANESTHESIA WITH HEMORRHOIDECTOMY N/A 02/12/2022   Procedure: EXAM UNDER ANESTHESIA WITH  HEMORRHOIDECTOMY;  Surgeon: Benjamine Sprague, DO;  Location: [facility] ORS;  Service: General;  Laterality: N/A;   GIVENS CAPSULE STUDY N/A 11/25/2017   Procedure: GIVENS CAPSULE STUDY;  Surgeon: Jonathon Bellows, MD;  Location: [facility] ENDOSCOPY;  Service: Gastroenterology;  Laterality: N/A;   HAND SURGERY     RIGHT/LEFT HEART CATH AND CORONARY ANGIOGRAPHY N/A 01/17/2020   Procedure: RIGHT/LEFT HEART CATH AND CORONARY ANGIOGRAPHY;  Surgeon: Corey Skains, MD;  Location: [place] [facility];  Service: Cardiovascular;  Laterality: N/A;   Social History:  reports that he quit smoking about 28 years ago. His smoking use included cigarettes. He has a 30.00 pack-year smoking history. He has never used smokeless tobacco. He reports that he does not drink alcohol and does not use drugs.  Allergies  Allergen Reactions   Shrimp [Shellfish Allergy] Anaphylaxis   Shrimp Extract Allergy Skin Test     Family History  Problem Relation Age of Onset   Heart disease Mother    Cancer Father    Cancer Paternal Grandfather     Prior to Admission medications   Medication Sig Start Date End Date Taking? Authorizing Provider  acetaminophen (TYLENOL) 500 MG tablet Take 500 mg by mouth every 6 (six) hours as needed for moderate pain or headache. 08/07/21   Val Riles, MD  albuterol (VENTOLIN HFA) 108 (90  Base) MCG/ACT inhaler Inhale 2 puffs into the lungs daily.    [provider]  allopurinol (ZYLOPRIM) 100 MG tablet Take 2 tablets by mouth daily. 06/25/21   [provider]  ascorbic acid (VITAMIN C) 500 MG tablet Take 500 mg by mouth daily.    [provider]  atorvastatin (LIPITOR) 20 MG tablet Take 20 mg by mouth at bedtime.     [provider]  BREO ELLIPTA 100-25 MCG/INH AEPB Inhale 1 puff into the lungs daily. 12/04/15   [provider]  Cholecalciferol (DIALYVITE VITAMIN D 5000) 125 MCG (5000 UT) capsule Take 5,000 Units by mouth daily.    [provider]   ELIQUIS 5 MG TABS tablet SMARTSIG:1 Tablet(s) By Mouth Every 12 Hours 01/21/20   [provider]  finasteride (PROSCAR) 5 MG tablet TAKE 1 TABLET BY MOUTH DAILY 08/10/21   McGowan, Larene Beach A, PA-C  furosemide (LASIX) 80 MG tablet Take 40 mg by mouth daily.    [provider]  guaiFENesin (MUCINEX) 600 MG 12 hr tablet Take 1 tablet (600 mg total) by mouth 2 (two) times daily. 02/19/22   Shawna Clamp, MD  insulin aspart (NOVOLOG) 100 UNIT/ML FlexPen Inject 20-26 Units into the skin 3 (three) times daily with meals. 20 units am 26 units noon, 26 units evening 08/27/19   [provider]  insulin degludec (TRESIBA FLEXTOUCH) 200 UNIT/ML FlexTouch Pen Inject 60 Units into the skin in the morning and at bedtime.    [provider]  lidocaine (XYLOCAINE) 5 % ointment Apply 1 Application topically 3 (three) times daily as needed. Apply to affected area as needed 3 times daily 02/12/22   Lysle Pearl, Isami, DO  lisinopril (ZESTRIL) 20 MG tablet Take 10 mg by mouth at bedtime.     [provider]  magnesium oxide (MAG-OX) 400 MG tablet Take 1 tablet by mouth 3 (three) times daily. 07/23/20   [provider]  metoprolol tartrate (LOPRESSOR) 25 MG tablet Take 1 tablet (25 mg total) by mouth 2 (two) times daily. 02/19/22   Shawna Clamp, MD  nystatin-triamcinolone ointment [facility]) Apply 1 Application topically 2 (two) times daily. Do not use for longer than 4-6 weeks. 02/01/22   Vaillancourt, Aldona Bar, PA-C  oxyCODONE (ROXICODONE) 5 MG immediate release tablet Take 1 tablet (5 mg total) by mouth every 8 (eight) hours as needed. 02/12/22 02/12/23  Lysle Pearl, Isami, DO  pantoprazole (PROTONIX) 40 MG tablet Take 1 tablet (40 mg total) by mouth 2 (two) times daily before a meal. 06/25/19   Aline August, MD  sildenafil (REVATIO) 20 MG tablet Take 20 mg by mouth 3 (three) times daily.    [provider]  TYVASO REFILL 0.6 MG/ML SOLN Inhale 18 mcg into the lungs in the morning,  at noon, in the evening, and at bedtime. 08/07/21   Val Riles, MD  vitamin B-12 (CYANOCOBALAMIN) 1000 MCG tablet Take 1,000 mcg by mouth daily.    [provider]    Physical Exam: Vitals:   05/27/22 0630 05/27/22 0730 05/27/22 0754 05/27/22 0800  BP: (!) 150/57 (!) 151/65  (!) 147/62  Pulse: 97 95  89  Resp: 20 (!) 0  (!) 23  Temp:   98.4 F (36.9 C)   TempSrc:   Oral   SpO2: 95% 95%  100%  Weight:      Height:       Physical Exam Vitals and nursing note reviewed.  Constitutional:      General: He is  awake.     Appearance: He is well-developed. He is ill-appearing.     Interventions: Nasal cannula and face mask in place.  HENT:     Head: Normocephalic.     Nose: No rhinorrhea.     Mouth/Throat:     Mouth: Mucous membranes are dry.  Eyes:     General: No scleral icterus.    Pupils: Pupils are equal, round, and reactive to light.  Neck:     Vascular: No JVD.  Cardiovascular:     Rate and Rhythm: Normal rate and regular rhythm.     Heart sounds: S1 normal and S2 normal.  Pulmonary:     Effort: Pulmonary effort is normal. Tachypnea present. No accessory muscle usage.     Breath sounds: Decreased breath sounds, wheezing, rhonchi and rales present.  Abdominal:     General: Bowel sounds are normal.     Palpations: Abdomen is soft.     Tenderness: There is no abdominal tenderness.  Musculoskeletal:     Cervical back: Neck supple.     Right lower leg: 1+ Pitting Edema present.     Left lower leg: 1+ Pitting Edema present.  Skin:    General: Skin is warm and dry.  Neurological:     General: No focal deficit present.     Mental Status: He is alert and oriented to person, place, and time.  Psychiatric:        Mood and Affect: Mood normal.        Behavior: Behavior normal. Behavior is cooperative.   Data Reviewed:  There are no new results to review at this time.  EKG today: Vent. rate 101 BPM PR interval 204 ms QRS duration 81 ms QT/QTcB 336/436  ms P-R-T axes 61 46 54 Sinus tachycardia  Assessment and Plan: Principal Problem:   Acute on chronic respiratory failure with hypoxia (HCC) Secondary to:   Pneumonia due to COVID-19 virus Admit to telemetry/inpatient. Supplemental oxygen as needed. Bronchodilators as needed. Incentive spirometry while awake. Flutter valve exercises. Antitussives as needed. Vitamin C/zinc supplementation Dexamethasone 6 mg IVP daily. Remdesivir per pharmacy. Follow-up CBC, CMP and chemistry.  Active Problems:   Chronic diastolic CHF (congestive heart failure) (HCC) Has 1+ lower extremity edema. Creatinine slightly higher than baseline. Continue lisinopril 10 mg p.o. daily. Continue furosemide 40 mg p.o. daily. Continue metoprolol 25 mg p.o. twice daily.    HTN (hypertension) On lisinopril, furosemide and metoprolol as above. Monitor BP, HR and GFR/electrolytes closely.    CKD (chronic kidney disease), stage IV ([place]) Monitor renal function/electrolytes. Follow-up with nephrology as an outpatient.    CAD (coronary artery disease) Continue statin, beta-blocker and DOAC.    Iron deficiency anemia due to chronic blood loss Also has CKD. Monitor hematocrit hemoglobin. Transfuse as needed.    Hyperlipidemia   Chronic gouty arthritis Allopurinol 200 mg p.o. daily.    PVD (peripheral vascular disease) ([place]) On atorvastatin and Eliquis.    Pulmonary hypertension, moderate to severe (HCC) Continue sildenafil 20 mg p.o. 3 times daily.    Type II diabetes mellitus with renal manifestations (HCC) Carbohydrate modified diet. CBG monitoring every 4 hours. COVID-19 glucose control protocol.    BPH (benign prostatic hyperplasia) Continue finasteride 5 mg p.o. daily.    GERD without esophagitis Continue PPI.      Advance Care Planning:   Code Status: Full Code   Consults:   Family Communication:   Severity of Illness: The appropriate patient status for this patient is INPATIENT.  Inpatient status is judged to be reasonable and necessary in order to provide the required intensity of service to ensure the patient's safety. The patient's presenting symptoms, physical exam findings, and initial radiographic and laboratory data in the context of their chronic comorbidities is felt to place them at high risk for further clinical deterioration. Furthermore, it is not anticipated that the patient will be medically stable for discharge from the hospital within 2 midnights of admission.   * I certify that at the point of admission it is my clinical judgment that the patient will require inpatient hospital care spanning beyond 2 midnights from the point of admission due to high intensity of service, high risk for further deterioration and high frequency of surveillance required.*  Author: Reubin Milan, MD 05/27/2022 8:42 AM  For on call review [email or web address].   This document was prepared using Dragon voice recognition software and may contain some unintended transcription errors.

## 2022-05-28 DIAGNOSIS — E663 Overweight: Secondary | ICD-10-CM

## 2022-05-28 DIAGNOSIS — I5032 Chronic diastolic (congestive) heart failure: Secondary | ICD-10-CM

## 2022-05-28 DIAGNOSIS — J1282 Pneumonia due to coronavirus disease 2019: Secondary | ICD-10-CM

## 2022-05-28 DIAGNOSIS — U071 COVID-19: Principal | ICD-10-CM

## 2022-05-28 LAB — GLUCOSE, CAPILLARY
Glucose-Capillary: 162 mg/dL — ABNORMAL HIGH (ref 70–99)
Glucose-Capillary: 167 mg/dL — ABNORMAL HIGH (ref 70–99)
Glucose-Capillary: 217 mg/dL — ABNORMAL HIGH (ref 70–99)
Glucose-Capillary: 224 mg/dL — ABNORMAL HIGH (ref 70–99)
Glucose-Capillary: 268 mg/dL — ABNORMAL HIGH (ref 70–99)
Glucose-Capillary: 315 mg/dL — ABNORMAL HIGH (ref 70–99)
Glucose-Capillary: 329 mg/dL — ABNORMAL HIGH (ref 70–99)

## 2022-05-28 LAB — CBC WITH DIFFERENTIAL/PLATELET
Abs Immature Granulocytes: 0.01 10*3/uL (ref 0.00–0.07)
Basophils Absolute: 0 10*3/uL (ref 0.0–0.1)
Basophils Relative: 0 %
Eosinophils Absolute: 0 10*3/uL (ref 0.0–0.5)
Eosinophils Relative: 0 %
HCT: 25.3 % — ABNORMAL LOW (ref 39.0–52.0)
Hemoglobin: 8.2 g/dL — ABNORMAL LOW (ref 13.0–17.0)
Immature Granulocytes: 0 %
Lymphocytes Relative: 18 %
Lymphs Abs: 0.9 10*3/uL (ref 0.7–4.0)
MCH: 30.3 pg (ref 26.0–34.0)
MCHC: 32.4 g/dL (ref 30.0–36.0)
MCV: 93.4 fL (ref 80.0–100.0)
Monocytes Absolute: 0.7 10*3/uL (ref 0.1–1.0)
Monocytes Relative: 14 %
Neutro Abs: 3.5 10*3/uL (ref 1.7–7.7)
Neutrophils Relative %: 68 %
Platelets: 218 10*3/uL (ref 150–400)
RBC: 2.71 MIL/uL — ABNORMAL LOW (ref 4.22–5.81)
RDW: 17.6 % — ABNORMAL HIGH (ref 11.5–15.5)
WBC: 5.2 10*3/uL (ref 4.0–10.5)
nRBC: 0 % (ref 0.0–0.2)

## 2022-05-28 LAB — COMPREHENSIVE METABOLIC PANEL
ALT: 17 U/L (ref 0–44)
AST: 17 U/L (ref 15–41)
Albumin: 3.4 g/dL — ABNORMAL LOW (ref 3.5–5.0)
Alkaline Phosphatase: 31 U/L — ABNORMAL LOW (ref 38–126)
Anion gap: 7 (ref 5–15)
BUN: 38 mg/dL — ABNORMAL HIGH (ref 8–23)
CO2: 26 mmol/L (ref 22–32)
Calcium: 8.7 mg/dL — ABNORMAL LOW (ref 8.9–10.3)
Chloride: 104 mmol/L (ref 98–111)
Creatinine, Ser: 2.46 mg/dL — ABNORMAL HIGH (ref 0.61–1.24)
GFR, Estimated: 26 mL/min — ABNORMAL LOW (ref >60)
Glucose, Bld: 169 mg/dL — ABNORMAL HIGH (ref 70–99)
Potassium: 4.5 mmol/L (ref 3.5–5.1)
Sodium: 137 mmol/L (ref 135–145)
Total Bilirubin: 0.9 mg/dL (ref 0.3–1.2)
Total Protein: 6.3 g/dL — ABNORMAL LOW (ref 6.5–8.1)

## 2022-05-28 LAB — D-DIMER, QUANTITATIVE: D-Dimer, Quant: 0.38 ug/mL-FEU (ref 0.00–0.50)

## 2022-05-28 LAB — C-REACTIVE PROTEIN
CRP: 4.1 mg/dL — ABNORMAL HIGH (ref <1.0)
CRP: 4 mg/dL — ABNORMAL HIGH (ref <1.0)

## 2022-05-28 LAB — PHOSPHORUS: Phosphorus: 4.3 mg/dL (ref 2.5–4.6)

## 2022-05-28 LAB — FERRITIN: Ferritin: 361 ng/mL — ABNORMAL HIGH (ref 24–336)

## 2022-05-28 LAB — MAGNESIUM: Magnesium: 2 mg/dL (ref 1.7–2.4)

## 2022-05-28 MED ORDER — FUROSEMIDE 10 MG/ML IJ SOLN
20.0000 mg | Freq: Once | INTRAMUSCULAR | Status: AC
  Administered 2022-05-28: 20 mg via INTRAVENOUS
  Filled 2022-05-28: qty 2

## 2022-05-28 NOTE — Assessment & Plan Note (Signed)
Monitor CBGs while on sliding scale.

## 2022-05-28 NOTE — Assessment & Plan Note (Signed)
Stable, at baseline

## 2022-05-28 NOTE — Assessment & Plan Note (Signed)
With normal procalcitonin, no need for additional antibiotics.  Continue steroids and Remdisivir.  CRP only minimally elevated fortunately.

## 2022-05-28 NOTE — Assessment & Plan Note (Signed)
Stable, hemoglobin at baseline

## 2022-05-28 NOTE — Progress Notes (Signed)
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  Initially patient found to be markedly hypoxic and was placed on 10 L high flow.  Although flu test negative, patient positive for COVID.  Patient was started on steroids, Remdisivir and admitted to the hospitalist service.  Over the next 24 hours, oxygen able to be weaned down to 5 L nasal cannula.  Assessment and Plan: Assessment and Plan: * Acute on chronic respiratory failure with hypoxia (HCC) Baseline of 3 to 4 L.  Secondary to COVID.  Already able to wean down some, currently at 5 L.  Continue treatment as below.  Pneumonia due to COVID-19 virus With normal procalcitonin, no need for additional antibiotics.  Continue steroids and Remdisivir.  CRP only minimally elevated fortunately.  Chronic diastolic CHF (congestive heart failure) (HCC) BNP only minimally elevated and this is in the setting of stage IV chronic kidney disease and respiratory issues.  We will give additional dose of IV Lasix to see if this improves his oxygenation, although he does not appear at this time to be in acute heart failure  HTN (hypertension) Continue home medications, blood pressure stable  Type II diabetes mellitus with renal manifestations (HCC) Monitor CBGs while on sliding scale.  CKD (chronic kidney disease), stage IV (HCC) Stable, at baseline  Iron deficiency anemia due to chronic blood loss Stable, hemoglobin at baseline  Overweight (BMI 25.0-29.9) Meets  criteria with BMI greater than 25  Hyperlipidemia Stable, continue statin       Body mass index is 28.28 kg/m.        Consultants: None  Procedures: None  Antimicrobials: IV Remdisivir 11/9-present  Code Status: Full code   Subjective: Patient states breathing is a little bit easier  Objective: Vital signs were reviewed and unremarkable. Vitals:   05/28/22 1131 05/28/22 1618  BP: (!) 127/54 (!) 145/66  Pulse: 66 65  Resp: 16 18  Temp: 97.7 F (36.5 C) 98.1 F (36.7 C)  SpO2: 98% 99%    Intake/Output Summary (Last 24 hours) at 05/28/2022 1626 Last data filed at 05/28/2022 1500 Gross per 24 hour  Intake 100 ml  Output 1100 ml  Net -1000 ml   Filed Weights   05/27/22 0605  Weight: 84.4 kg   Body mass index is 28.28 kg/m.  Exam:  General: Alert and oriented x3, no acute distress HEENT: Normocephalic, atraumatic, mucous membranes are moist Cardiovascular: Regular rate and rhythm, S1-S2 Respiratory: Decreased breath sounds throughout, no wheezes or crackles Abdomen: Soft, no tender, nondistended, positive bowel sounds Musculoskeletal: No clubbing or cyanosis or edema Skin: No skin breaks, tears or lesions Psychiatry: Appropriate, no evidence of psychoses Neurology: No focal deficits  Data Reviewed: Creatinine at 2.46 with GFR of 26.  Disposition:  Status is: Inpatient Remains inpatient appropriate because:  -Continue IV Remdisivir -Still not back to baseline in terms of hypoxia    Anticipated discharge date: 11/12 Family Communication: Daughter at the bedside DVT Prophylaxis:  apixaban (ELIQUIS) tablet 5 mg    Author: Annita Brod ,MD  05/28/2022 4:26 PM  To reach On-call, see care teams to locate the attending and reach out via [email or web address]. Between 7PM-7AM, please contact night-coverage If you still have difficulty reaching the attending provider, please page the [facility] (Director on Call) for [facility] on amion for  assistance.

## 2022-05-28 NOTE — Assessment & Plan Note (Signed)
Continue home medications, blood pressure stable

## 2022-05-28 NOTE — Assessment & Plan Note (Signed)
Meets criteria with BMI greater than 25

## 2022-05-28 NOTE — Assessment & Plan Note (Signed)
Stable, continue statin

## 2022-05-28 NOTE — Hospital Course (Signed)
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  Initially patient found to be markedly hypoxic and was placed on 10 L high flow.  Although flu test negative, patient positive for COVID.  Patient was started on steroids, Remdisivir and admitted to the hospitalist service.  Over the next 24 hours, oxygen able to be weaned down to 5 L nasal cannula.

## 2022-05-28 NOTE — Assessment & Plan Note (Signed)
BNP only minimally elevated and this is in the setting of stage IV chronic kidney disease and respiratory issues.  We will give additional dose of IV Lasix to see if this improves his oxygenation, although he does not appear at this time to be in acute heart failure

## 2022-05-28 NOTE — Assessment & Plan Note (Signed)
Baseline of 3 to 4 L.  Secondary to COVID.  Already able to wean down some, currently at 5 L.  Continue treatment as below.

## 2022-05-28 NOTE — TOC Initial Note (Signed)
Transition of Care [facility]) - Initial/Assessment Note    Patient Details  Name: Jay Morris MRN: [id number] Date of Birth: Aug 15, 1945  Transition of Care [facility]) CM/SW Contact:    Tiburcio Bash, LCSW Phone Number: 05/28/2022, 2:45 PM  Clinical Narrative:                  [facility] readmission risk assessment completed. Patient from home with wife, reports he continues to see PCP Dr. Doy Hutching, [facility]. Patient reports at baseline he's on 3L O2 at home, unsure of agency. Reports no questions or concerns at this time.   Please consult TOC should needs arise.   Expected Discharge Plan: Home/Self Care Barriers to Discharge: Continued Medical Work up   Patient Goals and CMS Choice Patient states their goals for this hospitalization and ongoing recovery are:: to go home CMS Medicare.gov Compare Post Acute Care list provided to:: Patient Choice offered to / list presented to : Patient  Expected Discharge Plan and Services Expected Discharge Plan: Home/Self Care       Living arrangements for the past 2 months: Single Family Home                                      Prior Living Arrangements/Services Living arrangements for the past 2 months: Single Family Home Lives with:: Spouse                   Activities of Daily Living Home Assistive Devices/Equipment: Oxygen ADL Screening (condition at time of admission) Patient's cognitive ability adequate to safely complete daily activities?: Yes Is the patient deaf or have difficulty hearing?: No Does the patient have difficulty seeing, even when wearing glasses/contacts?: No Does the patient have difficulty concentrating, remembering, or making decisions?: No Patient able to express need for assistance with ADLs?: Yes Does the patient have difficulty dressing or bathing?: No Independently performs ADLs?: Yes (appropriate for developmental age) Does the patient have difficulty walking or climbing stairs?:  No Weakness of Legs: None Weakness of Arms/Hands: None  Permission Sought/Granted                  Emotional Assessment       Orientation: : Oriented to Self, Oriented to Place, Oriented to  Time, Oriented to Situation Alcohol / Substance Use: Not Applicable Psych Involvement: No (comment)  Admission diagnosis:  COPD exacerbation ([place]) [J44.1] Acute on chronic respiratory failure with hypoxia (HCC) [J96.21] Type 2 diabetes mellitus without complication, with long-term current use of insulin (HCC) [E11.9, Z79.4] Pneumonia due to COVID-19 virus [U07.1, J12.82] Patient Active Problem List   Diagnosis Date Noted   Acute on chronic respiratory failure with hypoxia ([place]) 05/27/2022   Pneumonia due to COVID-19 virus 05/27/2022   Sepsis due to pneumonia ([place]) 02/13/2022   BPH (benign prostatic hyperplasia) 02/13/2022   Chronic obstructive pulmonary disease (COPD) ([place]) 02/13/2022   GERD without esophagitis 02/13/2022   Anemia in stage 4 chronic kidney disease ([place]) 12/22/2021   GI bleeding 08/03/2021   Chronic diastolic CHF (congestive heart failure) ([place]) 08/03/2021   HTN (hypertension) 08/03/2021   Atrial fibrillation, chronic ([place]) 08/03/2021   CKD (chronic kidney disease), stage IV ([place]) 08/03/2021   CAD (coronary artery disease) 08/03/2021   Leukocytosis 08/03/2021   Gout 01/08/2021   Hearing loss 01/08/2021   History of severe acute respiratory syndrome coronavirus 2 (SARS-CoV-2) disease 01/08/2021  Vitamin D deficiency 01/08/2021   Benign prostatic hyperplasia 12/31/2020   Anemia in chronic kidney disease 12/31/2020   Heart failure, unspecified ([place]) 12/31/2020   CKD (chronic kidney disease) stage 3, GFR 30-59 ml/min (HCC) 01/28/2020   Acute on chronic systolic heart failure ([place]) 01/16/2020   Pulmonary hypertension, moderate to severe ([place]) 11/26/2019   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/17/2019   Anemia, unspecified 09/01/2019   Acute on  chronic diastolic CHF (congestive heart failure), NYHA class 3 ([place]) 08/16/2019   PVD (peripheral vascular disease) ([place]) 06/03/2019   Hepatic steatosis 06/03/2019   Acute renal failure superimposed on stage 3a chronic kidney disease ([place]) 06/01/2019   COPD with acute exacerbation ([place]) 06/01/2019   Muscle weakness (generalized) 06/01/2019   COVID-19 virus infection 06/01/2019   AKI (acute kidney injury) ([place]) 05/31/2019   Acute respiratory failure with hypoxia ([place])    Vitamin B12 deficiency 05/08/2018   Type II diabetes mellitus with renal manifestations ([place]) 02/08/2018   At risk for bleeding 12/15/2017   Hypotension 12/01/2017   Iron deficiency anemia due to chronic blood loss    GI bleed 11/21/2017   Coronary artery disease involving native coronary artery of native heart 11/16/2017   Uncontrolled type 2 diabetes mellitus with hyperglycemia, with long-term current use of insulin ([place]) 11/24/2016   Chronic gouty arthritis 04/16/2016   COPD, moderate ([place]) 02/09/2016   Pneumonia 01/04/2016   Degeneration of lumbar intervertebral disc 10/28/2015   Paroxysmal atrial fibrillation with RVR ([place]) 05/21/2014   Hyperlipidemia 05/21/2014   PCP:  [name], MD Pharmacy:   [place], [place] - [address] [address] Phone: [phone] Fax: [phone]     Social Determinants of Health (SDOH) Interventions    Readmission Risk Interventions    08/07/2021    3:03 PM  Readmission Risk Prevention Plan  Transportation Screening Complete  PCP or Specialist Appt within 3-5 Days Complete  HRI or [place] Complete  Social Work Consult for [employer] Planning/Counseling Complete  Palliative Care Screening Not Applicable  Medication Review Press photographer) Complete

## 2022-05-29 LAB — CBC WITH DIFFERENTIAL/PLATELET
Abs Immature Granulocytes: 0.02 10*3/uL (ref 0.00–0.07)
Basophils Absolute: 0 10*3/uL (ref 0.0–0.1)
Basophils Relative: 0 %
Eosinophils Absolute: 0 10*3/uL (ref 0.0–0.5)
Eosinophils Relative: 0 %
HCT: 26.8 % — ABNORMAL LOW (ref 39.0–52.0)
Hemoglobin: 8.7 g/dL — ABNORMAL LOW (ref 13.0–17.0)
Immature Granulocytes: 0 %
Lymphocytes Relative: 17 %
Lymphs Abs: 1.3 10*3/uL (ref 0.7–4.0)
MCH: 30 pg (ref 26.0–34.0)
MCHC: 32.5 g/dL (ref 30.0–36.0)
MCV: 92.4 fL (ref 80.0–100.0)
Monocytes Absolute: 0.9 10*3/uL (ref 0.1–1.0)
Monocytes Relative: 11 %
Neutro Abs: 5.5 10*3/uL (ref 1.7–7.7)
Neutrophils Relative %: 72 %
Platelets: 245 10*3/uL (ref 150–400)
RBC: 2.9 MIL/uL — ABNORMAL LOW (ref 4.22–5.81)
RDW: 17.3 % — ABNORMAL HIGH (ref 11.5–15.5)
WBC: 7.7 10*3/uL (ref 4.0–10.5)
nRBC: 0 % (ref 0.0–0.2)

## 2022-05-29 LAB — COMPREHENSIVE METABOLIC PANEL
ALT: 51 U/L — ABNORMAL HIGH (ref 0–44)
AST: 47 U/L — ABNORMAL HIGH (ref 15–41)
Albumin: 3.7 g/dL (ref 3.5–5.0)
Alkaline Phosphatase: 31 U/L — ABNORMAL LOW (ref 38–126)
Anion gap: 8 (ref 5–15)
BUN: 50 mg/dL — ABNORMAL HIGH (ref 8–23)
CO2: 29 mmol/L (ref 22–32)
Calcium: 9.2 mg/dL (ref 8.9–10.3)
Chloride: 100 mmol/L (ref 98–111)
Creatinine, Ser: 2.62 mg/dL — ABNORMAL HIGH (ref 0.61–1.24)
GFR, Estimated: 24 mL/min — ABNORMAL LOW (ref >60)
Glucose, Bld: 158 mg/dL — ABNORMAL HIGH (ref 70–99)
Potassium: 4.7 mmol/L (ref 3.5–5.1)
Sodium: 137 mmol/L (ref 135–145)
Total Bilirubin: 0.8 mg/dL (ref 0.3–1.2)
Total Protein: 6.7 g/dL (ref 6.5–8.1)

## 2022-05-29 LAB — C-REACTIVE PROTEIN: CRP: 2.7 mg/dL — ABNORMAL HIGH (ref <1.0)

## 2022-05-29 LAB — GLUCOSE, CAPILLARY
Glucose-Capillary: 158 mg/dL — ABNORMAL HIGH (ref 70–99)
Glucose-Capillary: 203 mg/dL — ABNORMAL HIGH (ref 70–99)
Glucose-Capillary: 258 mg/dL — ABNORMAL HIGH (ref 70–99)
Glucose-Capillary: 372 mg/dL — ABNORMAL HIGH (ref 70–99)
Glucose-Capillary: 356 mg/dL — ABNORMAL HIGH (ref 70–99)

## 2022-05-29 LAB — FERRITIN: Ferritin: 552 ng/mL — ABNORMAL HIGH (ref 24–336)

## 2022-05-29 LAB — PHOSPHORUS: Phosphorus: 4 mg/dL (ref 2.5–4.6)

## 2022-05-29 LAB — MAGNESIUM: Magnesium: 2.1 mg/dL (ref 1.7–2.4)

## 2022-05-29 LAB — D-DIMER, QUANTITATIVE: D-Dimer, Quant: 0.27 ug/mL-FEU (ref 0.00–0.50)

## 2022-05-29 MED ORDER — INSULIN ASPART 100 UNIT/ML IJ SOLN
0.0000 [IU] | Freq: Three times a day (TID) | INTRAMUSCULAR | Status: DC
  Administered 2022-05-29: 15 [IU] via SUBCUTANEOUS
  Administered 2022-05-30: 11 [IU] via SUBCUTANEOUS
  Administered 2022-05-30: 5 [IU] via SUBCUTANEOUS
  Filled 2022-05-29 (×3): qty 1

## 2022-05-29 MED ORDER — INSULIN ASPART 100 UNIT/ML IJ SOLN
0.0000 [IU] | Freq: Every day | INTRAMUSCULAR | Status: DC
  Administered 2022-05-29: 5 [IU] via SUBCUTANEOUS
  Filled 2022-05-29: qty 1

## 2022-05-29 MED ORDER — PREDNISONE 20 MG PO TABS
40.0000 mg | ORAL_TABLET | Freq: Every day | ORAL | Status: DC
  Administered 2022-05-30: 40 mg via ORAL
  Filled 2022-05-29: qty 2

## 2022-05-29 MED ORDER — TAMSULOSIN HCL 0.4 MG PO CAPS
0.4000 mg | ORAL_CAPSULE | Freq: Every day | ORAL | Status: DC
  Administered 2022-05-29: 0.4 mg via ORAL
  Filled 2022-05-29: qty 1

## 2022-05-29 MED ORDER — TREPROSTINIL 0.6 MG/ML IN SOLN
18.0000 ug | Freq: Three times a day (TID) | RESPIRATORY_TRACT | Status: DC
  Administered 2022-05-29: 18 ug via RESPIRATORY_TRACT
  Filled 2022-05-29: qty 2.9

## 2022-05-29 NOTE — Progress Notes (Signed)
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  Initially patient found to be markedly hypoxic and was placed on 10 L high flow.  Although flu test negative, patient positive for COVID.  Patient was started on steroids, Remdisivir and admitted to the hospitalist service.  Over the next 24 hours, oxygen able to be weaned down to 5 L nasal cannula-- will need to be weaned to 3L his home dose  Assessment and Plan:  * Acute on chronic respiratory failure with hypoxia (HCC) Baseline of 3 to 4 L.  Secondary to COVID.  Wean to 3L.  Pneumonia due to COVID-19 virus With normal procalcitonin, no need for additional antibiotics.  Continue steroids  -Remdisivir completed  Chronic diastolic CHF (congestive heart failure) (HCC) BNP only minimally elevated and this is in the setting of stage IV chronic kidney disease and respiratory issues. -given 1 dose of IV lasix  HTN (hypertension) Continue home medications, blood pressure stable  Type II diabetes mellitus with renal manifestations (HCC) -SSI  CKD (chronic kidney disease), stage IV (HCC) Stable, at baseline  Iron deficiency anemia due to chronic blood loss Stable, hemoglobin at baseline  Overweight (BMI 25.0-29.9) Meets criteria with BMI greater than 25  Hyperlipidemia Stable, continue statin   Suspect home in the AM if continues to improve    Consultants: None  Procedures: None  Antimicrobials: IV Remdisivir 11/9-present  Code Status:  Full code   Subjective: breathing continues to improve  Objective: Vital signs were reviewed and unremarkable. Vitals:   05/29/22 0819 05/29/22 0932  BP: 138/70   Pulse: 65   Resp:    Temp: 98.8 F (37.1 C)   SpO2: 100% 98%    Intake/Output Summary (Last 24 hours) at 05/29/2022 1140 Last data filed at 05/29/2022 0429 Gross per 24 hour  Intake 100 ml  Output 1650 ml  Net -1550 ml   Filed Weights   05/27/22 0605  Weight: 84.4 kg   Body mass index is 28.28 kg/m.  Exam:   General: Appearance:     Overweight male in no acute distress     Lungs:    respirations unlabored  Heart:    Normal heart rate.    MS:   All extremities are intact.   Neurologic:   Awake, alert   Data Reviewed: Cr slightly up   Disposition:  Status is: Inpatient Remains inpatient appropriate-- suspect home in the AM    Anticipated discharge date: 11/12 or 11/13  DVT Prophylaxis:  apixaban (ELIQUIS) tablet 5 mg    Author: Geradine Girt ,DO 05/29/2022 11:40 AM  To reach On-call, see care teams to locate the attending and reach out via [email or web address]. Between 7PM-7AM, please contact night-coverage If you still have difficulty reaching the attending provider, please page the [facility] : [facility] (Director on Call) for [facility] on amion for assistance.

## 2022-05-30 LAB — CBC WITH DIFFERENTIAL/PLATELET
Abs Immature Granulocytes: 0.03 10*3/uL (ref 0.00–0.07)
Basophils Absolute: 0 10*3/uL (ref 0.0–0.1)
Basophils Relative: 0 %
Eosinophils Absolute: 0 10*3/uL (ref 0.0–0.5)
Eosinophils Relative: 0 %
HCT: 28.8 % — ABNORMAL LOW (ref 39.0–52.0)
Hemoglobin: 9.5 g/dL — ABNORMAL LOW (ref 13.0–17.0)
Immature Granulocytes: 0 %
Lymphocytes Relative: 18 %
Lymphs Abs: 1.5 10*3/uL (ref 0.7–4.0)
MCH: 30.4 pg (ref 26.0–34.0)
MCHC: 33 g/dL (ref 30.0–36.0)
MCV: 92 fL (ref 80.0–100.0)
Monocytes Absolute: 0.7 10*3/uL (ref 0.1–1.0)
Monocytes Relative: 9 %
Neutro Abs: 6 10*3/uL (ref 1.7–7.7)
Neutrophils Relative %: 73 %
Platelets: 265 10*3/uL (ref 150–400)
RBC: 3.13 MIL/uL — ABNORMAL LOW (ref 4.22–5.81)
RDW: 17.1 % — ABNORMAL HIGH (ref 11.5–15.5)
WBC: 8.2 10*3/uL (ref 4.0–10.5)
nRBC: 0 % (ref 0.0–0.2)

## 2022-05-30 LAB — C-REACTIVE PROTEIN: CRP: 1.8 mg/dL — ABNORMAL HIGH (ref <1.0)

## 2022-05-30 LAB — COMPREHENSIVE METABOLIC PANEL
ALT: 68 U/L — ABNORMAL HIGH (ref 0–44)
AST: 45 U/L — ABNORMAL HIGH (ref 15–41)
Albumin: 3.7 g/dL (ref 3.5–5.0)
Alkaline Phosphatase: 31 U/L — ABNORMAL LOW (ref 38–126)
Anion gap: 9 (ref 5–15)
BUN: 61 mg/dL — ABNORMAL HIGH (ref 8–23)
CO2: 28 mmol/L (ref 22–32)
Calcium: 9.5 mg/dL (ref 8.9–10.3)
Chloride: 99 mmol/L (ref 98–111)
Creatinine, Ser: 2.4 mg/dL — ABNORMAL HIGH (ref 0.61–1.24)
GFR, Estimated: 27 mL/min — ABNORMAL LOW (ref >60)
Glucose, Bld: 281 mg/dL — ABNORMAL HIGH (ref 70–99)
Potassium: 4.6 mmol/L (ref 3.5–5.1)
Sodium: 136 mmol/L (ref 135–145)
Total Bilirubin: 0.7 mg/dL (ref 0.3–1.2)
Total Protein: 6.5 g/dL (ref 6.5–8.1)

## 2022-05-30 LAB — GLUCOSE, CAPILLARY
Glucose-Capillary: 243 mg/dL — ABNORMAL HIGH (ref 70–99)
Glucose-Capillary: 349 mg/dL — ABNORMAL HIGH (ref 70–99)

## 2022-05-30 LAB — MAGNESIUM: Magnesium: 2 mg/dL (ref 1.7–2.4)

## 2022-05-30 LAB — D-DIMER, QUANTITATIVE: D-Dimer, Quant: 0.31 ug/mL-FEU (ref 0.00–0.50)

## 2022-05-30 LAB — FERRITIN: Ferritin: 528 ng/mL — ABNORMAL HIGH (ref 24–336)

## 2022-05-30 LAB — PHOSPHORUS: Phosphorus: 4.2 mg/dL (ref 2.5–4.6)

## 2022-05-30 MED ORDER — PREDNISONE 20 MG PO TABS: ORAL_TABLET | ORAL | 0 refills | Status: DC

## 2022-05-30 MED ORDER — METOPROLOL TARTRATE 25 MG PO TABS: 50.0000 mg | ORAL_TABLET | Freq: Two times a day (BID) | ORAL | Status: DC

## 2022-05-30 MED ORDER — FUROSEMIDE 40 MG PO TABS: 40.0000 mg | ORAL_TABLET | Freq: Every day | ORAL | 0 refills | Status: DC

## 2022-05-30 NOTE — Progress Notes (Signed)
Discharge packet given to patient. Patient verbalizes understanding. Patient family providing transportation. Patient wheeled out by staff.

## 2022-05-30 NOTE — Plan of Care (Signed)
  Problem: Education: Goal: Knowledge of risk factors and measures for prevention of condition will improve Outcome: Progressing   Problem: Respiratory: Goal: Will maintain a patent airway Outcome: Progressing   Problem: Respiratory: Goal: Complications related to the disease process, condition or treatment will be avoided or minimized Outcome: Progressing   Problem: Health Behavior/Discharge Planning: Goal: Ability to manage health-related needs will improve Outcome: Progressing   Problem: Activity: Goal: Risk for activity intolerance will decrease Outcome: Progressing

## 2022-05-30 NOTE — Discharge Summary (Signed)
Physician Discharge Summary  Jay Morris RCV:[id number] DOB: 1945/12/11 DOA: 05/27/2022  PCP: Jay Crouch, MD  Admit date: 05/27/2022 Discharge date: 05/30/2022  Admitted From: home Discharge disposition: home   Recommendations for Outpatient Follow-Up:   Resume home O2 CMP re: liver enzymes in 2-3 weeks   Discharge Diagnosis:   Principal Problem:   Acute on chronic respiratory failure with hypoxia ([place]) Active Problems:   Pneumonia due to COVID-19 virus   Chronic diastolic CHF (congestive heart failure) (HCC)   Type II diabetes mellitus with renal manifestations (HCC)   HTN (hypertension)   CKD (chronic kidney disease), stage IV (HCC)   Iron deficiency anemia due to chronic blood loss   Hyperlipidemia   Chronic gouty arthritis   PVD (peripheral vascular disease) ([place])   Pulmonary hypertension, moderate to severe (HCC)   CAD (coronary artery disease)   BPH (benign prostatic hyperplasia)   GERD without esophagitis   Overweight (BMI 25.0-29.9)    Discharge Condition: Improved.  Diet recommendation: Low sodium, heart healthy.  Carbohydrate-modified.  Wound care: None.  Code status: Full.   History of Present Illness:   Jay Morris is a 76 y.o. male with medical history significant of diastolic dysfunction, anemia, CAD, aortic atherosclerosis, hypertension, paroxysmal atrial fibrillation on apixaban, pulmonary hypertension, BPH, stage IV CKD, lumbar DDD, GERD, GI bleed,, H. pylori gastritis, hemorrhoids, gout, asbestos Bowsher, hyperlipidemia, MGUS, asthma/COPD, history of COVID pneumonitis, pulmonary fibrosis is coming to the hospital with a 3-day history of dyspnea, intermittent fevers, sore throat, runny nose, myalgias, fatigue and malaise.  His appetite is decreased.  No hemoptysis.  Chest wall pleuritic pain, but no precordial chest pain, palpitations, diaphoresis, PND, orthopnea or pitting edema of the lower extremities.  No abdominal pain,  nausea, emesis, diarrhea, constipation, melena or hematochezia.  No flank pain, dysuria, frequency or hematuria.  No polyuria, polydipsia, polyphagia or blurred vision.    Hospital Course by Problem:   Acute on chronic respiratory failure with hypoxia (HCC) Baseline of 3 to 4 L.  Secondary to COVID.  Wean to 3L.   Pneumonia due to COVID-19 virus With normal procalcitonin, no need for additional antibiotics.  Continue steroids  -Remdisivir completed   Chronic diastolic CHF (congestive heart failure) (HCC) BNP only minimally elevated and this is in the setting of stage IV chronic kidney disease and respiratory issues. -given 1 dose of IV lasix   HTN (hypertension) Continue home medications, blood pressure stable   Type II diabetes mellitus with renal manifestations (HCC) -SSI   CKD (chronic kidney disease), stage IV (HCC) Stable, at baseline   Iron deficiency anemia due to chronic blood loss Stable, hemoglobin at baseline   Overweight (BMI 25.0-29.9) Meets criteria with BMI greater than 25   Hyperlipidemia Stable, continue statin     Medical Consultants:      Discharge Exam:   Vitals:   05/30/22 0803 05/30/22 0807  BP: (!) 137/59   Pulse: (!) 48 (!) 56  Resp:  18  Temp: 97.7 F (36.5 C)   SpO2: 97% 96%   Vitals:   05/29/22 1625 05/29/22 2209 05/30/22 0803 05/30/22 0807  BP: (!) 128/52 (!) 153/65 (!) 137/59   Pulse: 69 64 (!) 48 (!) 56  Resp:  (!) 22  18  Temp: 97.9 F (36.6 C) 98.1 F (36.7 C) 97.7 F (36.5 C)   TempSrc:  Oral    SpO2: 96% 97% 97% 96%  Weight:      Height:  General exam: Appears calm and comfortable.    The results of significant diagnostics from this hospitalization (including imaging, microbiology, ancillary and laboratory) are listed below for reference.     Procedures and Diagnostic Studies:   DG Chest Portable 1 View  Result Date: 05/27/2022 CLINICAL DATA:  Shortness of breath. EXAM: PORTABLE CHEST 1 VIEW  COMPARISON:  02/04/2022 FINDINGS: Marked chronic interstitial lung disease again noted. No evidence for superimposed new focal consolidative opacity. No pleural effusion. The cardio pericardial silhouette is enlarged. Bones are diffusely demineralized. Telemetry leads overlie the chest. IMPRESSION: Marked chronic interstitial lung disease. No new or acute cardiopulmonary findings. Electronically Signed   By: Misty Stanley M.D.   On: 05/27/2022 07:39     Labs:   Basic Metabolic Panel: Recent Labs  Lab 05/27/22 0608 05/28/22 0558 05/29/22 0519 05/30/22 0526  NA 139 137 137 136  K 4.7 4.5 4.7 4.6  CL 106 104 100 99  CO2 _0 GLUCOSE 226* 169* 158* 281*  BUN 34* 38* 50* 61*  CREATININE 2.67* 2.46* 2.62* 2.40*  CALCIUM 9.1 8.7* 9.2 9.5  MG  --  2.0 2.1 2.0  PHOS  --  4.3 4.0 4.2   GFR Estimated Creatinine Clearance: 26.8 mL/min (A) (by C-G formula based on SCr of 2.4 mg/dL (H)). Liver Function Tests: Recent Labs  Lab 05/27/22 0608 05/28/22 0558 05/29/22 0519 05/30/22 0526  AST 25 17 47* 45*  ALT 21 17 51* 68*  ALKPHOS 37* 31* 31* 31*  BILITOT 1.1 0.9 0.8 0.7  PROT 7.3 6.3* 6.7 6.5  ALBUMIN 4.1 3.4* 3.7 3.7   No results for input(s): "LIPASE", "AMYLASE" in the last 168 hours. No results for input(s): "AMMONIA" in the last 168 hours. Coagulation profile No results for input(s): "INR", "PROTIME" in the last 168 hours.  CBC: Recent Labs  Lab 05/27/22 0608 05/28/22 0558 05/29/22 0519 05/30/22 0526  WBC 7.7 5.2 7.7 8.2  NEUTROABS 6.0 3.5 5.5 6.0  HGB 9.7* 8.2* 8.7* 9.5*  HCT 30.1* 25.3* 26.8* 28.8*  MCV 94.7 93.4 92.4 92.0  PLT 223 218 245 265   Cardiac Enzymes: No results for input(s): "CKTOTAL", "CKMB", "CKMBINDEX", "TROPONINI" in the last 168 hours. BNP: Invalid input(s): "POCBNP" CBG: Recent Labs  Lab 05/29/22 0859 05/29/22 1304 05/29/22 1608 05/29/22 2205 05/30/22 0804  GLUCAP 203* 258* 372* 356* 243*   D-Dimer Recent Labs    05/29/22 0519  05/30/22 0526  DDIMER 0.27 0.31   Hgb A1c No results for input(s): "HGBA1C" in the last 72 hours. Lipid Profile No results for input(s): "CHOL", "HDL", "LDLCALC", "TRIG", "CHOLHDL", "LDLDIRECT" in the last 72 hours. Thyroid function studies No results for input(s): "TSH", "T4TOTAL", "T3FREE", "THYROIDAB" in the last 72 hours.  Invalid input(s): "FREET3" Anemia work up Recent Labs    05/29/22 0519 05/30/22 0526  FERRITIN 552* 528*   Microbiology Recent Results (from the past 240 hour(s))  Blood culture (routine x 2)     Status: None (Preliminary result)   Collection Time: 05/27/22  6:08 AM   Specimen: BLOOD  Result Value Ref Range Status   Specimen Description BLOOD RIGHT [facility]  Final   Special Requests   Final    BOTTLES DRAWN AEROBIC AND ANAEROBIC Blood Culture results may not be optimal due to an inadequate volume of blood received in culture bottles   Culture   Final    NO GROWTH 3 DAYS Performed at [facility], [address] [place], [place] [address]  Report Status PENDING  Incomplete  Blood culture (routine x 2)     Status: None (Preliminary result)   Collection Time: 05/27/22  6:08 AM   Specimen: BLOOD  Result Value Ref Range Status   Specimen Description BLOOD LEFT HAND  Final   Special Requests   Final    BOTTLES DRAWN AEROBIC AND ANAEROBIC Blood Culture results may not be optimal due to an inadequate volume of blood received in culture bottles   Culture   Final    NO GROWTH 3 DAYS Performed at [facility], [address], [place], [place] [address]    Report Status PENDING  Incomplete  Resp Panel by RT-PCR (Flu A&B, Covid) Anterior Nasal Swab     Status: Abnormal   Collection Time: 05/27/22  6:08 AM   Specimen: Anterior Nasal Swab  Result Value Ref Range Status   SARS Coronavirus 2 by RT PCR POSITIVE (A) NEGATIVE Final    Comment: (NOTE) SARS-CoV-2 target nucleic acids are DETECTED.  The SARS-CoV-2 RNA is generally detectable in  upper respiratory specimens during the acute phase of infection. Positive results are indicative of the presence of the identified virus, but do not rule out bacterial infection or co-infection with other pathogens not detected by the test. Clinical correlation with patient history and other diagnostic information is necessary to determine patient infection status. The expected result is Negative.  Fact Sheet for Patients: EntrepreneurPulse.com.au  Fact Sheet for Healthcare Providers: IncredibleEmployment.be  This test is not yet approved or cleared by the [place] FDA and  has been authorized for detection and/or diagnosis of SARS-CoV-2 by FDA under an Emergency Use Authorization (EUA).  This EUA will remain in effect (meaning this test can be used) for the duration of  the COVID-19 declaration under Section 564(b)(1) of the A ct, 21 U.S.C. section 360bbb-3(b)(1), unless the authorization is terminated or revoked sooner.     Influenza A by PCR NEGATIVE NEGATIVE Final   Influenza B by PCR NEGATIVE NEGATIVE Final    Comment: (NOTE) The Xpert Xpress SARS-CoV-2/FLU/RSV plus assay is intended as an aid in the diagnosis of influenza from Nasopharyngeal swab specimens and should not be used as a sole basis for treatment. Nasal washings and aspirates are unacceptable for Xpert Xpress SARS-CoV-2/FLU/RSV testing.  Fact Sheet for Patients: EntrepreneurPulse.com.au  Fact Sheet for Healthcare Providers: IncredibleEmployment.be  This test is not yet approved or cleared by the [place] FDA and has been authorized for detection and/or diagnosis of SARS-CoV-2 by FDA under an Emergency Use Authorization (EUA). This EUA will remain in effect (meaning this test can be used) for the duration of the COVID-19 declaration under Section 564(b)(1) of the Act, 21 U.S.C. section 360bbb-3(b)(1), unless the authorization is  terminated or revoked.  Performed at [facility], [address], [place], [place] [address]      Discharge Instructions:   Discharge Instructions     Diet - low sodium heart healthy   Complete by: As directed    Diet Carb Modified   Complete by: As directed    Discharge instructions   Complete by: As directed    Continue [place] at 3L   Increase activity slowly   Complete by: As directed       Allergies as of 05/30/2022       Reactions   Shrimp [shellfish Allergy] Anaphylaxis   Shrimp Extract Allergy Skin Test         Medication List     STOP  taking these medications    nystatin-triamcinolone ointment Commonly known as: MYCOLOG       TAKE these medications    acetaminophen 500 MG tablet Commonly known as: TYLENOL Take 500-1,000 mg by mouth every 6 (six) hours as needed for moderate pain or headache.   allopurinol 100 MG tablet Commonly known as: ZYLOPRIM Take 200 mg by mouth daily.   apixaban 5 MG Tabs tablet Commonly known as: ELIQUIS Take 5 mg by mouth daily.   ascorbic acid 500 MG tablet Commonly known as: VITAMIN C Take 500 mg by mouth daily.   atorvastatin 20 MG tablet Commonly known as: LIPITOR Take 20 mg by mouth at bedtime.   calcitRIOL 0.25 MCG capsule Commonly known as: ROCALTROL Take 0.25 mcg by mouth daily.   cyanocobalamin 1000 MCG tablet Commonly known as: VITAMIN B12 Take 1,000 mcg by mouth daily.   Dialyvite Vitamin D 5000 125 MCG (5000 UT) capsule Generic drug: Cholecalciferol Take 5,000 Units by mouth daily.   finasteride 5 MG tablet Commonly known as: PROSCAR TAKE 1 TABLET BY MOUTH DAILY   fluticasone furoate-vilanterol 100-25 MCG/ACT Aepb Commonly known as: BREO ELLIPTA Inhale 1 puff into the lungs daily.   furosemide 40 MG tablet Commonly known as: LASIX Take 1 tablet (40 mg total) by mouth daily. What changed: how much to take   insulin aspart 100 UNIT/ML FlexPen Commonly known as:  NOVOLOG Inject 35-45 Units into the skin 3 (three) times daily with meals. (Based upon blood glucose reading)   lisinopril 5 MG tablet Commonly known as: ZESTRIL Take 5 mg by mouth daily.   magnesium oxide 400 MG tablet Commonly known as: MAG-OX Take 400 mg by mouth 3 (three) times daily.   metoprolol tartrate 25 MG tablet Commonly known as: LOPRESSOR Take 2 tablets (50 mg total) by mouth 2 (two) times daily.   pantoprazole 40 MG tablet Commonly known as: PROTONIX Take 1 tablet (40 mg total) by mouth 2 (two) times daily before a meal.   predniSONE 20 MG tablet Commonly known as: DELTASONE 40 mg x 1 day then 30 mg x 1 day then 20 mg x 1 day then 10 mg x 1 day then stop Start taking on: May 31, 2022   sildenafil 20 MG tablet Commonly known as: REVATIO Take 20 mg by mouth 3 (three) times daily.   tamsulosin 0.4 MG Caps capsule Commonly known as: FLOMAX Take 0.4 mg by mouth daily after supper.   Treprostinil 0.6 MG/ML Soln Commonly known as: TYVASO Inhale 18 mcg into the lungs 3 (three) times daily.   Tyler Aas FlexTouch 200 UNIT/ML FlexTouch Pen Generic drug: insulin degludec Inject 60 Units into the skin at bedtime.          Time coordinating discharge: 45 min  Signed:  Geradine Girt DO  [facility] 05/30/2022, 9:48 AM

## 2022-05-30 NOTE — Plan of Care (Signed)
  Problem: Education: Goal: Knowledge of risk factors and measures for prevention of condition will improve Outcome: Progressing   Problem: Coping: Goal: Psychosocial and spiritual needs will be supported Outcome: Progressing   Problem: Respiratory: Goal: Will maintain a patent airway Outcome: Progressing Goal: Complications related to the disease process, condition or treatment will be avoided or minimized Outcome: Progressing   Problem: Education: Goal: Knowledge of General Education information will improve Description: Including pain rating scale, medication(s)/side effects and non-pharmacologic comfort measures Outcome: Progressing   Problem: Health Behavior/Discharge Planning: Goal: Ability to manage health-related needs will improve Outcome: Progressing   Problem: Clinical Measurements: Goal: Ability to maintain clinical measurements within normal limits will improve Outcome: Progressing Goal: Will remain free from infection Outcome: Progressing Goal: Diagnostic test results will improve Outcome: Progressing Goal: Respiratory complications will improve Outcome: Progressing Goal: Cardiovascular complication will be avoided Outcome: Progressing   Problem: Activity: Goal: Risk for activity intolerance will decrease Outcome: Progressing   Problem: Nutrition: Goal: Adequate nutrition will be maintained Outcome: Progressing   Problem: Coping: Goal: Level of anxiety will decrease Outcome: Progressing   Problem: Elimination: Goal: Will not experience complications related to bowel motility Outcome: Progressing Goal: Will not experience complications related to urinary retention Outcome: Progressing   Problem: Pain Managment: Goal: General experience of comfort will improve Outcome: Progressing   Problem: Safety: Goal: Ability to remain free from injury will improve Outcome: Progressing   Problem: Skin Integrity: Goal: Risk for impaired skin integrity will  decrease Outcome: Progressing   Problem: Education: Goal: Ability to describe self-care measures that may prevent or decrease complications (Diabetes Survival Skills Education) will improve Outcome: Progressing Goal: Individualized Educational Video(s) Outcome: Progressing   Problem: Coping: Goal: Ability to adjust to condition or change in health will improve Outcome: Progressing   Problem: Fluid Volume: Goal: Ability to maintain a balanced intake and output will improve Outcome: Progressing   Problem: Health Behavior/Discharge Planning: Goal: Ability to identify and utilize available resources and services will improve Outcome: Progressing Goal: Ability to manage health-related needs will improve Outcome: Progressing   Problem: Metabolic: Goal: Ability to maintain appropriate glucose levels will improve Outcome: Progressing   Problem: Nutritional: Goal: Maintenance of adequate nutrition will improve Outcome: Progressing Goal: Progress toward achieving an optimal weight will improve Outcome: Progressing   Problem: Skin Integrity: Goal: Risk for impaired skin integrity will decrease Outcome: Progressing   Problem: Tissue Perfusion: Goal: Adequacy of tissue perfusion will improve Outcome: Progressing

## 2022-06-01 LAB — CULTURE, BLOOD (ROUTINE X 2)
Culture: NO GROWTH
Culture: NO GROWTH

## 2022-07-16 ENCOUNTER — Other Ambulatory Visit: Payer: Self-pay | Admitting: Oncology

## 2022-07-17 ENCOUNTER — Other Ambulatory Visit: Payer: Self-pay | Admitting: Urology

## 2022-07-17 DIAGNOSIS — N401 Enlarged prostate with lower urinary tract symptoms: Secondary | ICD-10-CM

## 2022-07-21 ENCOUNTER — Other Ambulatory Visit: Payer: Self-pay

## 2022-07-21 DIAGNOSIS — D649 Anemia, unspecified: Secondary | ICD-10-CM

## 2022-07-22 ENCOUNTER — Inpatient Hospital Stay: Payer: Medicare HMO | Attending: Oncology

## 2022-07-22 ENCOUNTER — Inpatient Hospital Stay (HOSPITAL_BASED_OUTPATIENT_CLINIC_OR_DEPARTMENT_OTHER): Payer: Medicare HMO | Admitting: Oncology

## 2022-07-22 ENCOUNTER — Inpatient Hospital Stay: Payer: Medicare HMO

## 2022-07-22 VITALS — BP 134/58 | HR 55 | Temp 97.4°F | Resp 19 | Wt 191.0 lb

## 2022-07-22 DIAGNOSIS — D649 Anemia, unspecified: Secondary | ICD-10-CM

## 2022-07-22 DIAGNOSIS — N189 Chronic kidney disease, unspecified: Secondary | ICD-10-CM | POA: Diagnosis present

## 2022-07-22 DIAGNOSIS — I4891 Unspecified atrial fibrillation: Secondary | ICD-10-CM | POA: Insufficient documentation

## 2022-07-22 LAB — IRON AND TIBC
Iron: 52 ug/dL (ref 45–182)
Saturation Ratios: 15 % — ABNORMAL LOW (ref 17.9–39.5)
TIBC: 339 ug/dL (ref 250–450)
UIBC: 287 ug/dL

## 2022-07-22 LAB — CBC WITH DIFFERENTIAL/PLATELET
Abs Immature Granulocytes: 0.02 10*3/uL (ref 0.00–0.07)
Basophils Absolute: 0.1 10*3/uL (ref 0.0–0.1)
Basophils Relative: 1 %
Eosinophils Absolute: 0.2 10*3/uL (ref 0.0–0.5)
Eosinophils Relative: 3 %
HCT: 28.3 % — ABNORMAL LOW (ref 39.0–52.0)
Hemoglobin: 9.4 g/dL — ABNORMAL LOW (ref 13.0–17.0)
Immature Granulocytes: 0 %
Lymphocytes Relative: 29 %
Lymphs Abs: 2.4 10*3/uL (ref 0.7–4.0)
MCH: 32.8 pg (ref 26.0–34.0)
MCHC: 33.2 g/dL (ref 30.0–36.0)
MCV: 98.6 fL (ref 80.0–100.0)
Monocytes Absolute: 0.8 10*3/uL (ref 0.1–1.0)
Monocytes Relative: 10 %
Neutro Abs: 4.8 10*3/uL (ref 1.7–7.7)
Neutrophils Relative %: 57 %
Platelets: 196 10*3/uL (ref 150–400)
RBC: 2.87 MIL/uL — ABNORMAL LOW (ref 4.22–5.81)
RDW: 17.2 % — ABNORMAL HIGH (ref 11.5–15.5)
WBC: 8.3 10*3/uL (ref 4.0–10.5)
nRBC: 0 % (ref 0.0–0.2)

## 2022-07-22 LAB — FERRITIN: Ferritin: 205 ng/mL (ref 24–336)

## 2022-07-22 MED ORDER — DARBEPOETIN ALFA 200 MCG/0.4ML IJ SOSY
200.0000 ug | PREFILLED_SYRINGE | Freq: Once | INTRAMUSCULAR | Status: AC
  Administered 2022-07-22: 200 ug via SUBCUTANEOUS
  Filled 2022-07-22: qty 0.4

## 2022-07-22 MED FILL — Iron Sucrose Inj 20 MG/ML (Fe Equiv): INTRAVENOUS | Qty: 10 | Status: AC

## 2022-07-22 NOTE — Progress Notes (Signed)
[facility]  [phone] Fax:([phone]) [phone]  ID: Jay Morris OB: Jun 16, 1946  MR#: [id number]  CSN#:[id number]  Patient Care Team: [name], MD as PCP - General (Internal Medicine) Lloyd Huger, MD as Consulting Physician (Oncology)  CHIEF COMPLAINT: Anemia, unspecified.  INTERVAL HISTORY: Patient returns to clinic today for further evaluation and continuation of Retacrit.  He currently feels well and is asymptomatic.  He does not complain of any weakness or fatigue. He has no neurologic complaints.  He denies any recent fevers or illnesses.  He has a good appetite and denies weight loss.  He has chronic shortness of breath requiring oxygen, but denies any chest pain, cough, or hemoptysis.  He denies any nausea, vomiting, constipation, or diarrhea.  He has no melena or hematochezia.  He has no urinary complaints.  Patient offers no further specific complaints today.  REVIEW OF SYSTEMS:   Review of Systems  Constitutional: Negative.  Negative for fever, malaise/fatigue and weight loss.  Respiratory:  Positive for shortness of breath. Negative for cough and hemoptysis.   Cardiovascular: Negative.  Negative for chest pain and leg swelling.  Gastrointestinal: Negative.  Negative for abdominal pain, blood in stool and melena.  Genitourinary: Negative.  Negative for hematuria.  Musculoskeletal: Negative.  Negative for back pain.  Skin: Negative.  Negative for rash.  Neurological: Negative.  Negative for dizziness, focal weakness, weakness and headaches.  Psychiatric/Behavioral: Negative.  The patient is not nervous/anxious.     As per HPI. Otherwise, a complete review of systems is negative.  PAST MEDICAL HISTORY: Past Medical History:  Diagnosis Date   (HFpEF) heart failure with preserved ejection fraction ([place])    a.) TTE 04/01/2016: EF 60-65%, LAE, MAC, mild MR/AR/TR, mild-mod PR, G2DD; b.) TTE 12/18/2018: EF 55%, mild LVH, mild AR/MR/TR.  mod PR; c.) TTE 05/31/2019: EF 60-65%, mild LVH, RAE/RVE, triv AR.MT, PR, mild TR; d.) TTE 10/26/2019: EF 55%, BAE, RVE, triv AR, mild MR/PR, sev TR, G2DD; e.) R/LHC 01/17/2020: mPA 52, PCWP 25, LVEDP 19, RVEDP 25, mRA 13   Anemia    Aortic atherosclerosis (HCC)    Asthma    BPH (benign prostatic hyperplasia)    CKD (chronic kidney disease), stage III (HCC)    COPD (chronic obstructive pulmonary disease) ([place])    Coronary artery disease 01/17/2020   a.) LHC 01/17/2020: 25% oLAD, 65% o-pRCA, 45% pLAD, 65% mLAD, 35% OM2 --> med mgmt.   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   PAF (paroxysmal atrial fibrillation) (HCC)    a.) CHA2DS2-VASc = 6 (age x 2, HFpEF, HTN, vascular disease, T2DM);  b.) cardiac ablation in approx 2014-2015 at [facility]; c.) A.fib recurrence in setting of SARS-CoV-2 --> Tx'd with IV amiodarone + metoprolol + DCCV x 1 (200 J) 06/22/2019 restoring NSR; d.) rate/rhythm maintained without pharmacological intervention; chronically anticoagulated using standard dose apixaban   Pneumonia due to COVID-19 virus 06/02/2019   a.) hospitalized at [facility]) from 06/02/2019 - 06/25/2019; Tx'd with steriods + remdesivir + convalescent plasma   Pulmonary fibrosis ([place])  Pulmonary HTN ([place]) 04/01/2016   a.) TTE 04/01/2016: EF 60-65%, RVSP 40; b.) TTE 12/18/2018: EF 55%, RVSP 45.9; c.) TTE 05/31/2019: EF 60-65%, RVSP 35.5; d.) TTE 10/26/2019: EF 55%, RVSP 100.4; e.) R/LHC 01/17/2020: mPA 52, PCWP 25, LVEDP 19, RVEDP 25, mRA 13; f.) Tx with PDE5i (sildenafil) + vasodilator (treprostinil)   Type 2  diabetes mellitus treated with insulin ([place])     PAST SURGICAL HISTORY: Past Surgical History:  Procedure Laterality Date   BACK SURGERY     x3 L3-L4    CARDIAC ELECTROPHYSIOLOGY MAPPING AND ABLATION N/A    performed at [facility] in approximately 2014-2015   CARDIOVERSION N/A 06/22/2019   Procedure: CARDIOVERSION;  Surgeon: Fay Records, MD;  Location: [facility];  Service: Cardiovascular;  Laterality: N/A;   COLONOSCOPY WITH PROPOFOL N/A 06/15/2017   Procedure: COLONOSCOPY WITH PROPOFOL;  Surgeon: Toledo, Benay Pike, MD;  Location: [facility] ENDOSCOPY;  Service: Gastroenterology;  Laterality: N/A;   ENTEROSCOPY N/A 11/24/2017   Procedure: ENTEROSCOPY;  Surgeon: Jonathon Bellows, MD;  [facility];  Service: Gastroenterology;  Laterality: N/A;   ESOPHAGOGASTRODUODENOSCOPY N/A 11/27/2017   Procedure: ESOPHAGOGASTRODUODENOSCOPY (EGD);  Surgeon: Lucilla Lame, MD;  Location: [facility] ENDOSCOPY;  Service: Endoscopy;  Laterality: N/A;   ESOPHAGOGASTRODUODENOSCOPY (EGD) WITH PROPOFOL N/A 06/02/2017   Procedure: ESOPHAGOGASTRODUODENOSCOPY (EGD) WITH PROPOFOL;  Surgeon: Toledo, Benay Pike, MD;  Location: [facility] ENDOSCOPY;  Service: Gastroenterology;  Laterality: N/A;   ESOPHAGOGASTRODUODENOSCOPY (EGD) WITH PROPOFOL N/A 08/04/2021   Procedure: ESOPHAGOGASTRODUODENOSCOPY (EGD) WITH PROPOFOL;  Surgeon: Lesly Rubenstein, MD;  Location: [facility] ENDOSCOPY;  Service: Endoscopy;  Laterality: N/A;   EVALUATION UNDER ANESTHESIA WITH HEMORRHOIDECTOMY N/A 02/12/2022   Procedure: EXAM UNDER ANESTHESIA WITH HEMORRHOIDECTOMY;  Surgeon: Benjamine Sprague, DO;  Location: [facility] ORS;  Service: General;  Laterality: N/A;   GIVENS CAPSULE STUDY N/A 11/25/2017   Procedure: GIVENS CAPSULE STUDY;  Surgeon: Jonathon Bellows, MD;  Location: [facility] ENDOSCOPY;  Service: Gastroenterology;  Laterality: N/A;   HAND SURGERY     RIGHT/LEFT HEART CATH AND CORONARY ANGIOGRAPHY N/A 01/17/2020   Procedure: RIGHT/LEFT HEART CATH AND CORONARY ANGIOGRAPHY;   Surgeon: Corey Skains, MD;  Location: [facility] CV LAB;  Service: Cardiovascular;  Laterality: N/A;    FAMILY HISTORY: Family History  Problem Relation Age of Onset   Heart disease Mother    Cancer Father    Cancer Paternal Grandfather     ADVANCED DIRECTIVES (Y/N):  N  HEALTH MAINTENANCE: Social History   Tobacco Use   Smoking status: Former    Packs/day: 1.00    Years: 30.00    Total pack years: 30.00    Types: Cigarettes    Quit date: 1995    Years since quitting: 29.0   Smokeless tobacco: Never  Vaping Use   Vaping Use: Never used  Substance Use Topics   Alcohol use: No    Alcohol/week: 0.0 standard drinks of alcohol    Comment: occasional   Drug use: No     Colonoscopy:  PAP:  Bone density:  Lipid panel:  Allergies  Allergen Reactions   Shrimp [Shellfish Allergy] Anaphylaxis   Shrimp Extract Allergy Skin Test     Current Outpatient Medications  Medication Sig Dispense Refill   acetaminophen (TYLENOL) 500 MG tablet Take 500-1,000 mg by mouth every 6 (six) hours as needed for moderate pain or headache. 30 tablet 0   allopurinol (ZYLOPRIM) 100 MG tablet Take 200 mg by mouth daily.     apixaban (ELIQUIS) 5 MG TABS tablet Take 5  mg by mouth daily.     ascorbic acid (VITAMIN C) 500 MG tablet Take 500 mg by mouth daily.     atorvastatin (LIPITOR) 20 MG tablet Take 20 mg by mouth at bedtime.      calcitRIOL (ROCALTROL) 0.25 MCG capsule Take 0.25 mcg by mouth daily.     Cholecalciferol (DIALYVITE VITAMIN D 5000) 125 MCG (5000 UT) capsule Take 5,000 Units by mouth daily.     finasteride (PROSCAR) 5 MG tablet TAKE 1 TABLET BY MOUTH DAILY 30 tablet 11   fluticasone furoate-vilanterol (BREO ELLIPTA) 100-25 MCG/ACT AEPB Inhale 1 puff into the lungs daily.     furosemide (LASIX) 40 MG tablet Take 1 tablet (40 mg total) by mouth daily. 30 tablet 0   insulin aspart (NOVOLOG) 100 UNIT/ML FlexPen Inject 35-45 Units into the skin 3 (three) times daily with meals.  (Based upon blood glucose reading)     insulin degludec (TRESIBA FLEXTOUCH) 200 UNIT/ML FlexTouch Pen Inject 60 Units into the skin at bedtime.     lisinopril (ZESTRIL) 5 MG tablet Take 5 mg by mouth daily.     magnesium oxide (MAG-OX) 400 MG tablet Take 400 mg by mouth 3 (three) times daily.     metoprolol tartrate (LOPRESSOR) 25 MG tablet Take 2 tablets (50 mg total) by mouth 2 (two) times daily.     pantoprazole (PROTONIX) 40 MG tablet Take 1 tablet (40 mg total) by mouth 2 (two) times daily before a meal. 60 tablet 0   sildenafil (REVATIO) 20 MG tablet Take 20 mg by mouth 3 (three) times daily.     tamsulosin (FLOMAX) 0.4 MG CAPS capsule Take 0.4 mg by mouth daily after supper.     Treprostinil (TYVASO) 0.6 MG/ML SOLN Inhale 18 mcg into the lungs 3 (three) times daily.     vitamin B-12 (CYANOCOBALAMIN) 1000 MCG tablet Take 1,000 mcg by mouth daily.     No current facility-administered medications for this visit.    OBJECTIVE: Vitals:   07/22/22 1414  BP: (!) 134/58  Pulse: (!) 55  Resp: 19  Temp: (!) 97.4 F (36.3 C)  SpO2: 95%     Body mass index is 29.04 kg/m.    ECOG FS:1 - Symptomatic but completely ambulatory  General: Well-developed, well-nourished, no acute distress.  Sitting in a wheelchair. Eyes: Pink conjunctiva, anicteric sclera. HEENT: Normocephalic, moist mucous membranes. Lungs: No audible wheezing or coughing. Heart: Regular rate and rhythm. Abdomen: Soft, nontender, no obvious distention. Musculoskeletal: No edema, cyanosis, or clubbing. Neuro: Alert, answering all questions appropriately. Cranial nerves grossly intact. Skin: No rashes or petechiae noted. Psych: Normal affect.  LAB RESULTS:  Lab Results  Component Value Date   NA 136 05/30/2022   K 4.6 05/30/2022   CL 99 05/30/2022   CO2 28 05/30/2022   GLUCOSE 281 (H) 05/30/2022   BUN 61 (H) 05/30/2022   CREATININE 2.40 (H) 05/30/2022   CALCIUM 9.5 05/30/2022   PROT 6.5 05/30/2022   ALBUMIN 3.7  05/30/2022   AST 45 (H) 05/30/2022   ALT 68 (H) 05/30/2022   ALKPHOS 31 (L) 05/30/2022   BILITOT 0.7 05/30/2022   GFRNONAA 27 (L) 05/30/2022   GFRAA 39 (L) 10/18/2019    Lab Results  Component Value Date   WBC 8.3 07/22/2022   NEUTROABS 4.8 07/22/2022   HGB 9.4 (L) 07/22/2022   HCT 28.3 (L) 07/22/2022   MCV 98.6 07/22/2022   PLT 196 07/22/2022   Lab Results  Component Value Date  IRON 34 (L) 04/21/2022   TIBC 427 04/21/2022   IRONPCTSAT 8 (L) 04/21/2022   Lab Results  Component Value Date   FERRITIN 528 (H) 05/30/2022     STUDIES: No results found.   ASSESSMENT: Anemia, unspecified.  PLAN:   1. Anemia, unspecified: Bone marrow biopsy completed on June 25, 2020 was essentially negative only with a hypercellular marrow for age.  Patient was noted to have decreased iron stores and rare ringed sideroblasts.  FISH and cytogenetics were negative.  Recent iron stores were decreased and patient received Venofer x 5 with his most recent treatment on May 06, 2022.  Repeat iron stores are pending at time of dictation. Previously, repeat B12, folate, and hemolysis labs were found to be either negative or within normal limits.  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  Patient's hemoglobin remains below 10.0, therefore we will proceed with 200 mcg Aranesp today.  Return to clinic in 3 months with repeat laboratory work, further evaluation, and continuation of treatment if needed.   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  Kappa free light chains are only mildly elevated and immunoglobulins are within normal limits.  Bone marrow biopsy only revealed approximately 1% plasma cells.  This is likely clinically insignificant, but will continue to monitor on a yearly basis. 3.  Chronic renal insufficiency.  Chronic and unchanged.  Patient's most recent creatinine is 2.40.  Continue follow-up with nephrology  as indicated.  I spent a total of 30 minutes reviewing chart data, face-to-face evaluation with the patient, counseling and coordination of care as detailed above.   Patient expressed understanding and was in agreement with this plan. He also understands that He can call clinic at any time with any questions, concerns, or complaints.    Lloyd Huger, MD   07/22/2022 3:38 PM

## 2022-07-22 NOTE — Progress Notes (Signed)
Appetite is good. Energy feels normal. Had Covid in November, with hospitalization.No visible blood in stools in quite a long time.Has dizziness and lightheadedness.

## 2022-07-23 ENCOUNTER — Inpatient Hospital Stay: Payer: Medicare HMO

## 2022-07-30 ENCOUNTER — Ambulatory Visit: Payer: Medicare HMO | Admitting: Physician Assistant

## 2022-07-30 VITALS — BP 151/69 | HR 58 | Wt 191.0 lb

## 2022-07-30 DIAGNOSIS — R338 Other retention of urine: Secondary | ICD-10-CM | POA: Diagnosis not present

## 2022-07-30 DIAGNOSIS — N401 Enlarged prostate with lower urinary tract symptoms: Secondary | ICD-10-CM

## 2022-07-30 NOTE — Progress Notes (Addendum)
07/30/2022 4:22 PM   [name] 19-May-1946 [id number]  CC: Chief Complaint  Patient presents with   Benign Prostatic Hypertrophy   HPI: Fischer Halley is a 77 y.o. male with PMH diabetes, COPD, CHF, CKD 4, and BPH with urinary retention managed with [facility]IC once daily on finasteride who presents today for annual follow-up.  He is accompanied today by his wife, who contributes to HPI.  Today he reports he continues to void spontaneously during the day, but his wife catheterizes him every night to fully empty his bladder.  She gets 700-800 mL out every time she caths him.  He denies any bladder discomfort, fullness, or pain.  He reports 2 UTIs in the past year, which were managed by his PCP.  He reports his typical symptoms of UTI or urinary color changes and cloudy urine.  Request catheter supply reorder today.  PMH: Past Medical History:  Diagnosis Date   (HFpEF) heart failure with preserved ejection fraction ([place])    a.) TTE 04/01/2016: EF 60-65%, LAE, MAC, mild MR/AR/TR, mild-mod PR, G2DD; b.) TTE 12/18/2018: EF 55%, mild LVH, mild AR/MR/TR. mod PR; c.) TTE 05/31/2019: EF 60-65%, mild LVH, RAE/RVE, triv AR.MT, PR, mild TR; d.) TTE 10/26/2019: EF 55%, BAE, RVE, triv AR, mild MR/PR, sev TR, G2DD; e.) R/LHC 01/17/2020: mPA 52, PCWP 25, LVEDP 19, RVEDP 25, mRA 13   Anemia    Aortic atherosclerosis (HCC)    Asthma    BPH (benign prostatic hyperplasia)    CKD (chronic kidney disease), stage III (HCC)    COPD (chronic obstructive pulmonary disease) ([place])    Coronary artery disease 01/17/2020   a.) LHC 01/17/2020: 25% oLAD, 65% o-pRCA, 45% pLAD, 65% mLAD, 35% OM2 --> med mgmt.   DDD (degenerative disc disease), lumbar    GERD (gastroesophageal reflux disease)    GI bleed 38/1771   Gout    Helicobacter pylori gastritis    Hemorrhoids    History of 2019 novel coronavirus disease (COVID-19) 05/30/2019   History of asbestos exposure    Hyperlipidemia    Hypertension    Long term  current use of anticoagulant    a.) apixaban   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/03/2019   PAF (paroxysmal atrial fibrillation) (HCC)    a.) CHA2DS2-VASc = 6 (age x 2, HFpEF, HTN, vascular disease, T2DM);  b.) cardiac ablation in approx 2014-2015 at [facility]; c.) A.fib recurrence in setting of SARS-CoV-2 --> Tx'd with IV amiodarone + metoprolol + DCCV x 1 (200 J) 06/22/2019 restoring NSR; d.) rate/rhythm maintained without pharmacological intervention; chronically anticoagulated using standard dose apixaban   Pneumonia due to COVID-19 virus 06/02/2019   a.) hospitalized at [facility]) from 06/02/2019 - 06/25/2019; Tx'd with steriods + remdesivir + convalescent plasma   Pulmonary fibrosis (HCC)    Pulmonary HTN ([place]) 04/01/2016   a.) TTE 04/01/2016: EF 60-65%, RVSP 40; b.) TTE 12/18/2018: EF 55%, RVSP 45.9; c.) TTE 05/31/2019: EF 60-65%, RVSP 35.5; d.) TTE 10/26/2019: EF 55%, RVSP 100.4; e.) R/LHC 01/17/2020: mPA 52, PCWP 25, LVEDP 19, RVEDP 25, mRA 13; f.) Tx with PDE5i (sildenafil) + vasodilator (treprostinil)   Type 2 diabetes mellitus treated with insulin ([place])     Surgical History: Past Surgical History:  Procedure Laterality Date   BACK SURGERY     x3 L3-L4    CARDIAC ELECTROPHYSIOLOGY MAPPING AND ABLATION N/A    performed at [facility] in approximately 2014-2015   CARDIOVERSION N/A 06/22/2019   Procedure: CARDIOVERSION;  Surgeon:  Fay Records, MD;  Location: [facility];  Service: Cardiovascular;  Laterality: N/A;   COLONOSCOPY WITH PROPOFOL N/A 06/15/2017   Procedure: COLONOSCOPY WITH PROPOFOL;  Surgeon: Toledo, Benay Pike, MD;  Location: [facility] ENDOSCOPY;  Service: Gastroenterology;  Laterality: N/A;   ENTEROSCOPY N/A 11/24/2017   Procedure: ENTEROSCOPY;  Surgeon: Jonathon Bellows, MD;  Location: [facility] ENDOSCOPY;  Service: Gastroenterology;  Laterality: N/A;   ESOPHAGOGASTRODUODENOSCOPY N/A 11/27/2017   Procedure: ESOPHAGOGASTRODUODENOSCOPY (EGD);  Surgeon:  Lucilla Lame, MD;  Location: [facility] ENDOSCOPY;  Service: Endoscopy;  Laterality: N/A;   ESOPHAGOGASTRODUODENOSCOPY (EGD) WITH PROPOFOL N/A 06/02/2017   Procedure: ESOPHAGOGASTRODUODENOSCOPY (EGD) WITH PROPOFOL;  Surgeon: Toledo, Benay Pike, MD;  Location: [facility] ENDOSCOPY;  Service: Gastroenterology;  Laterality: N/A;   ESOPHAGOGASTRODUODENOSCOPY (EGD) WITH PROPOFOL N/A 08/04/2021   Procedure: ESOPHAGOGASTRODUODENOSCOPY (EGD) WITH PROPOFOL;  Surgeon: Lesly Rubenstein, MD;  Location: [facility] ENDOSCOPY;  Service: Endoscopy;  Laterality: N/A;   EVALUATION UNDER ANESTHESIA WITH HEMORRHOIDECTOMY N/A 02/12/2022   Procedure: EXAM UNDER ANESTHESIA WITH HEMORRHOIDECTOMY;  Surgeon: Benjamine Sprague, DO;  Location: [facility] ORS;  Service: General;  Laterality: N/A;   GIVENS CAPSULE STUDY N/A 11/25/2017   Procedure: GIVENS CAPSULE STUDY;  Surgeon: Jonathon Bellows, MD;  Location: [facility] ENDOSCOPY;  Service: Gastroenterology;  Laterality: N/A;   HAND SURGERY     RIGHT/LEFT HEART CATH AND CORONARY ANGIOGRAPHY N/A 01/17/2020   Procedure: RIGHT/LEFT HEART CATH AND CORONARY ANGIOGRAPHY;  Surgeon: Corey Skains, MD;  Location: [facility];  Service: Cardiovascular;  Laterality: N/A;    Home Medications:  Allergies as of 07/30/2022       Reactions   Shrimp [shellfish Allergy] Anaphylaxis   Shrimp Extract Allergy Skin Test         Medication List        Accurate as of July 30, 2022  4:22 PM. If you have any questions, ask your nurse or doctor.          acetaminophen 500 MG tablet Commonly known as: TYLENOL Take 500-1,000 mg by mouth every 6 (six) hours as needed for moderate pain or headache.   allopurinol 100 MG tablet Commonly known as: ZYLOPRIM Take 200 mg by mouth daily.   apixaban 5 MG Tabs tablet Commonly known as: ELIQUIS Take 5 mg by mouth daily.   ascorbic acid 500 MG tablet Commonly known as: VITAMIN C Take 500 mg by mouth daily.   atorvastatin 20 MG tablet Commonly known as:  LIPITOR Take 20 mg by mouth at bedtime.   calcitRIOL 0.25 MCG capsule Commonly known as: ROCALTROL Take 0.25 mcg by mouth daily.   cyanocobalamin 1000 MCG tablet Commonly known as: VITAMIN B12 Take 1,000 mcg by mouth daily.   Dialyvite Vitamin D 5000 125 MCG (5000 UT) capsule Generic drug: Cholecalciferol Take 5,000 Units by mouth daily.   finasteride 5 MG tablet Commonly known as: PROSCAR TAKE 1 TABLET BY MOUTH DAILY   fluticasone furoate-vilanterol 100-25 MCG/ACT Aepb Commonly known as: BREO ELLIPTA Inhale 1 puff into the lungs daily.   furosemide 20 MG tablet Commonly known as: LASIX Take 20 mg by mouth daily. What changed: Another medication with the same name was removed. Continue taking this medication, and follow the directions you see here.   insulin aspart 100 UNIT/ML FlexPen Commonly known as: NOVOLOG Inject 35-45 Units into the skin 3 (three) times daily with meals. (Based upon blood glucose reading)   lisinopril 5 MG tablet Commonly known as: ZESTRIL Take 5 mg by mouth daily.   magnesium oxide 400  MG tablet Commonly known as: MAG-OX Take 400 mg by mouth 3 (three) times daily.   metoprolol tartrate 25 MG tablet Commonly known as: LOPRESSOR Take 2 tablets (50 mg total) by mouth 2 (two) times daily.   pantoprazole 40 MG tablet Commonly known as: PROTONIX Take 1 tablet (40 mg total) by mouth 2 (two) times daily before a meal.   sildenafil 20 MG tablet Commonly known as: REVATIO Take 20 mg by mouth 3 (three) times daily.   tamsulosin 0.4 MG Caps capsule Commonly known as: FLOMAX Take 0.4 mg by mouth daily after supper.   Treprostinil 0.6 MG/ML Soln Commonly known as: TYVASO Inhale 18 mcg into the lungs 3 (three) times daily.   Tyler Aas FlexTouch 200 UNIT/ML FlexTouch Pen Generic drug: insulin degludec Inject 60 Units into the skin at bedtime.        Allergies:  Allergies  Allergen Reactions   Shrimp [Shellfish Allergy] Anaphylaxis   Shrimp  Extract Allergy Skin Test     Family History: Family History  Problem Relation Age of Onset   Heart disease Mother    Cancer Father    Cancer Paternal Grandfather     Social History:   reports that he quit smoking about 29 years ago. His smoking use included cigarettes. He has a 30.00 pack-year smoking history. He has never used smokeless tobacco. He reports that he does not drink alcohol and does not use drugs.  Physical Exam: BP (!) 151/69   Pulse (!) 58   Wt 191 lb (86.6 kg)   BMI 29.04 kg/m   Constitutional:  Alert and oriented, no acute distress, nontoxic appearing HEENT: [place], AT Cardiovascular: No clubbing, cyanosis, or edema Respiratory: Normal respiratory effort, no increased work of breathing, on supplemental oxygen Skin: No rashes, bruises or suspicious lesions Neurologic: Grossly intact, no focal deficits, moving all 4 extremities Psychiatric: Normal mood and affect  Assessment & Plan:   1. Benign prostatic hyperplasia with urinary retention Will manage with once daily catheterization by Mrs. Gerilyn Nestle.  If he develops more recurrent UTIs, may consider increasing his frequency of catheterization to twice daily, however given his comorbidities we will keep things stable for now.  I gave him samples of the new [facility] Luja catheters and she will call us next week to let us know which catheters she would like ordered.  Patient is unable to pass a straight tip catheter due to anatomy with a known history of BPH.  Return for Mrs. Gerilyn Nestle to call Monday with [facility] catheter preference for order.  Debroah Loop, PA-C  [facility] [address], [place], [place] [address] [phone]

## 2022-08-02 ENCOUNTER — Telehealth: Payer: Self-pay | Admitting: Family Medicine

## 2022-08-02 NOTE — Telephone Encounter (Signed)
Order sent to [facility]

## 2022-08-02 NOTE — Telephone Encounter (Signed)
Patient's wife called and they would like to get the Newest catheter. She states they tried a couple and those a re the best ones.

## 2022-08-02 NOTE — Telephone Encounter (Signed)
Sam do you remember which cath you gave them? Luja??

## 2022-08-02 NOTE — Telephone Encounter (Signed)
Yes, 14Fr Lujas.

## 2022-10-20 ENCOUNTER — Other Ambulatory Visit: Payer: Self-pay | Admitting: Physician Assistant

## 2022-10-20 ENCOUNTER — Ambulatory Visit (INDEPENDENT_AMBULATORY_CARE_PROVIDER_SITE_OTHER): Payer: Medicare HMO | Admitting: Physician Assistant

## 2022-10-20 ENCOUNTER — Other Ambulatory Visit: Payer: Self-pay

## 2022-10-20 ENCOUNTER — Encounter: Payer: Self-pay | Admitting: Physician Assistant

## 2022-10-20 VITALS — BP 122/64 | HR 50 | Ht 67.0 in | Wt 191.0 lb

## 2022-10-20 DIAGNOSIS — N476 Balanoposthitis: Secondary | ICD-10-CM | POA: Diagnosis not present

## 2022-10-20 DIAGNOSIS — N471 Phimosis: Secondary | ICD-10-CM

## 2022-10-20 DIAGNOSIS — K649 Unspecified hemorrhoids: Secondary | ICD-10-CM | POA: Diagnosis not present

## 2022-10-20 DIAGNOSIS — D649 Anemia, unspecified: Secondary | ICD-10-CM

## 2022-10-20 MED ORDER — LIDOCAINE 5 % EX OINT: 1.0000 | TOPICAL_OINTMENT | Freq: Three times a day (TID) | CUTANEOUS | 0 refills | Status: DC | PRN

## 2022-10-20 MED ORDER — NYSTATIN-TRIAMCINOLONE 100000-0.1 UNIT/GM-% EX OINT: 1.0000 | TOPICAL_OINTMENT | Freq: Two times a day (BID) | CUTANEOUS | 2 refills | Status: AC

## 2022-10-20 NOTE — Progress Notes (Signed)
Surgical Physician Order [facility]  Dr. Bernardo Heater * Scheduling expectation : Next Available  *Length of Case:   *Clearance needed: yes, cardiac (fmr Dr. Nehemiah Massed pt) and pulmonary (Dr. Raul Del)  *Anticoagulation Instructions: Hold all anticoagulants  *Aspirin Instructions: N/A  *Post-op visit Date/Instructions:   2 week wound check  *Diagnosis: Phimosis  *Procedure:     Dorsal Slit ([id number])   Additional orders: N/A  -Admit type: OUTpatient  -Anesthesia:  MAC + penile block  -VTE Prophylaxis Standing Order SCD's       Other:   -Standing Lab Orders Per Anesthesia    Lab other: None  -Standing Test orders EKG/Chest x-ray per Anesthesia       Test other:   - Medications:  Ancef 2gm IV  -Other orders:  N/A

## 2022-10-20 NOTE — Progress Notes (Signed)
10/20/2022 2:27 PM   Jay Morris 1946-03-09 [id number]  CC: Chief Complaint  Patient presents with   Other    Rash   HPI: Jay Morris is a 77 y.o. male with PMH A fib on Eliquis, pulmonary hypertension, pulmonary fibrosis, COPD on 3L at baseline, DM2, CHF, CKD 4, and BPH with urinary retention managed with [facility] once daily on finasteride who presents today for evaluation of rash.  He is accompanied today by his wife, who contributes to HPI.  Today he reports he has had intermittent difficulty retracting his foreskin associated with pain and bleeding cracks in the skin since I first saw him for this problem last year and started him on Mycolog ointment.  He states that the ointment intermittently helps, however his symptoms never fully resolve.  This is making daily in and out catheterization more challenging.  Additionally, he reports return of bleeding hemorrhoids despite hemorrhoidectomy last year.  They asked for refill of topical lidocaine while they await follow-up.  PMH: Past Medical History:  Diagnosis Date   (HFpEF) heart failure with preserved ejection fraction    a.) TTE 04/01/2016: EF 60-65%, LAE, MAC, mild MR/AR/TR, mild-mod PR, G2DD; b.) TTE 12/18/2018: EF 55%, mild LVH, mild AR/MR/TR. mod PR; c.) TTE 05/31/2019: EF 60-65%, mild LVH, RAE/RVE, triv AR.MT, PR, mild TR; d.) TTE 10/26/2019: EF 55%, BAE, RVE, triv AR, mild MR/PR, sev TR, G2DD; e.) R/LHC 01/17/2020: mPA 52, PCWP 25, LVEDP 19, RVEDP 25, mRA 13   Anemia    Aortic atherosclerosis    Asthma    BPH (benign prostatic hyperplasia)    CKD (chronic kidney disease), stage III    COPD (chronic obstructive pulmonary disease)    Coronary artery disease 01/17/2020   a.) LHC 01/17/2020: 25% oLAD, 65% o-pRCA, 45% pLAD, 65% mLAD, 35% OM2 --> med mgmt.   DDD (degenerative disc disease), lumbar    GERD (gastroesophageal reflux disease)    GI bleed [id number]   Gout    Helicobacter pylori gastritis    Hemorrhoids     History of 2019 novel coronavirus disease (COVID-19) 05/30/2019   History of asbestos exposure    Hyperlipidemia    Hypertension    Long term current use of anticoagulant    a.) apixaban   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/03/2019   PAF (paroxysmal atrial fibrillation)    a.) CHA2DS2-VASc = 6 (age x 2, HFpEF, HTN, vascular disease, T2DM);  b.) cardiac ablation in approx 2014-2015 at [facility]; c.) A.fib recurrence in setting of SARS-CoV-2 --> Tx'd with IV amiodarone + metoprolol + DCCV x 1 (200 J) 06/22/2019 restoring NSR; d.) rate/rhythm maintained without pharmacological intervention; chronically anticoagulated using standard dose apixaban   Pneumonia due to COVID-19 virus 06/02/2019   a.) hospitalized at [facility]) from 06/02/2019 - 06/25/2019; Tx'd with steriods + remdesivir + convalescent plasma   Pulmonary fibrosis    Pulmonary HTN 04/01/2016   a.) TTE 04/01/2016: EF 60-65%, RVSP 40; b.) TTE 12/18/2018: EF 55%, RVSP 45.9; c.) TTE 05/31/2019: EF 60-65%, RVSP 35.5; d.) TTE 10/26/2019: EF 55%, RVSP 100.4; e.) R/LHC 01/17/2020: mPA 52, PCWP 25, LVEDP 19, RVEDP 25, mRA 13; f.) Tx with PDE5i (sildenafil) + vasodilator (treprostinil)   Type 2 diabetes mellitus treated with insulin     Surgical History: Past Surgical History:  Procedure Laterality Date   BACK SURGERY     x3 L3-L4    CARDIAC ELECTROPHYSIOLOGY MAPPING AND ABLATION N/A    performed at [facility] in  approximately 2014-2015   CARDIOVERSION N/A 06/22/2019   Procedure: CARDIOVERSION;  Surgeon: Fay Records, MD;  Location: [facility] ENDOSCOPY;  Service: Cardiovascular;  Laterality: N/A;   COLONOSCOPY WITH PROPOFOL N/A 06/15/2017   Procedure: COLONOSCOPY WITH PROPOFOL;  Surgeon: Toledo, Benay Pike, MD;  Location: [facility] ENDOSCOPY;  Service: Gastroenterology;  Laterality: N/A;   ENTEROSCOPY N/A 11/24/2017   Procedure: ENTEROSCOPY;  Surgeon: Jonathon Bellows, MD;  Location: [facility] ENDOSCOPY;  Service: Gastroenterology;   Laterality: N/A;   ESOPHAGOGASTRODUODENOSCOPY N/A 11/27/2017   Procedure: ESOPHAGOGASTRODUODENOSCOPY (EGD);  Surgeon: Lucilla Lame, MD;  Location: [facility] ENDOSCOPY;  Service: Endoscopy;  Laterality: N/A;   ESOPHAGOGASTRODUODENOSCOPY (EGD) WITH PROPOFOL N/A 06/02/2017   Procedure: ESOPHAGOGASTRODUODENOSCOPY (EGD) WITH PROPOFOL;  Surgeon: Toledo, Benay Pike, MD;  Location: [facility] ENDOSCOPY;  Service: Gastroenterology;  Laterality: N/A;   ESOPHAGOGASTRODUODENOSCOPY (EGD) WITH PROPOFOL N/A 08/04/2021   Procedure: ESOPHAGOGASTRODUODENOSCOPY (EGD) WITH PROPOFOL;  Surgeon: Lesly Rubenstein, MD;  Location: [facility] ENDOSCOPY;  Service: Endoscopy;  Laterality: N/A;   EVALUATION UNDER ANESTHESIA WITH HEMORRHOIDECTOMY N/A 02/12/2022   Procedure: EXAM UNDER ANESTHESIA WITH HEMORRHOIDECTOMY;  Surgeon: Benjamine Sprague, DO;  Location: [facility] ORS;  Service: General;  Laterality: N/A;   GIVENS CAPSULE STUDY N/A 11/25/2017   Procedure: GIVENS CAPSULE STUDY;  Surgeon: Jonathon Bellows, MD;  Location: [facility] ENDOSCOPY;  Service: Gastroenterology;  Laterality: N/A;   HAND SURGERY     RIGHT/LEFT HEART CATH AND CORONARY ANGIOGRAPHY N/A 01/17/2020   Procedure: RIGHT/LEFT HEART CATH AND CORONARY ANGIOGRAPHY;  Surgeon: Corey Skains, MD;  Location: [facility];  Service: Cardiovascular;  Laterality: N/A;    Home Medications:  Allergies as of 10/20/2022       Reactions   Shrimp [shellfish Allergy] Anaphylaxis   Shrimp Extract         Medication List        Accurate as of October 20, 2022  2:27 PM. If you have any questions, ask your nurse or doctor.          acetaminophen 500 MG tablet Commonly known as: TYLENOL Take 500-1,000 mg by mouth every 6 (six) hours as needed for moderate pain or headache.   allopurinol 100 MG tablet Commonly known as: ZYLOPRIM Take 200 mg by mouth daily.   apixaban 5 MG Tabs tablet Commonly known as: ELIQUIS Take 5 mg by mouth daily.   ascorbic acid 500 MG tablet Commonly known  as: VITAMIN C Take 500 mg by mouth daily.   atorvastatin 20 MG tablet Commonly known as: LIPITOR Take 20 mg by mouth at bedtime.   calcitRIOL 0.25 MCG capsule Commonly known as: ROCALTROL Take 0.25 mcg by mouth daily.   cyanocobalamin 1000 MCG tablet Commonly known as: VITAMIN B12 Take 1,000 mcg by mouth daily.   Dialyvite Vitamin D 5000 125 MCG (5000 UT) capsule Generic drug: Cholecalciferol Take 5,000 Units by mouth daily.   finasteride 5 MG tablet Commonly known as: PROSCAR TAKE 1 TABLET BY MOUTH DAILY   fluticasone furoate-vilanterol 100-25 MCG/ACT Aepb Commonly known as: BREO ELLIPTA Inhale 1 puff into the lungs daily.   furosemide 20 MG tablet Commonly known as: LASIX Take 20 mg by mouth daily.   insulin aspart 100 UNIT/ML FlexPen Commonly known as: NOVOLOG Inject 35-45 Units into the skin 3 (three) times daily with meals. (Based upon blood glucose reading)   lisinopril 5 MG tablet Commonly known as: ZESTRIL Take 5 mg by mouth daily.   magnesium oxide 400 MG tablet Commonly known as: MAG-OX Take 400 mg by mouth  3 (three) times daily.   metoprolol tartrate 25 MG tablet Commonly known as: LOPRESSOR Take 2 tablets (50 mg total) by mouth 2 (two) times daily.   pantoprazole 40 MG tablet Commonly known as: PROTONIX Take 1 tablet (40 mg total) by mouth 2 (two) times daily before a meal.   sildenafil 20 MG tablet Commonly known as: REVATIO Take 20 mg by mouth 3 (three) times daily.   tamsulosin 0.4 MG Caps capsule Commonly known as: FLOMAX Take 0.4 mg by mouth daily after supper.   Treprostinil 0.6 MG/ML Soln Commonly known as: TYVASO Inhale 18 mcg into the lungs 3 (three) times daily.   Tyler Aas FlexTouch 200 UNIT/ML FlexTouch Pen Generic drug: insulin degludec Inject 60 Units into the skin at bedtime.        Allergies:  Allergies  Allergen Reactions   Shrimp [Shellfish Allergy] Anaphylaxis   Shrimp Extract     Family History: Family  History  Problem Relation Age of Onset   Heart disease Mother    Cancer Father    Cancer Paternal Grandfather     Social History:   reports that he quit smoking about 29 years ago. His smoking use included cigarettes. He has a 30.00 pack-year smoking history. He has never used smokeless tobacco. He reports that he does not drink alcohol and does not use drugs.  Physical Exam: BP 122/64   Pulse (!) 50   Ht 5\' 7"  (1.702 m)   Wt 191 lb (86.6 kg)   BMI 29.91 kg/m   Constitutional:  Alert and oriented, no acute distress, nontoxic appearing HEENT: [place], AT Cardiovascular: No clubbing, cyanosis, or edema Respiratory: Mildly increased work of breathing, on supplemental oxygen GU: Mildly edematous foreskin, difficult to retract fully.  Hypopigmentation of the distal foreskin.  White discharge noted beneath the foreskin along the glans penis.  No significant erythema noted. Skin: No rashes, bruises or suspicious lesions Neurologic: Grossly intact, no focal deficits, moving all 4 extremities Psychiatric: Normal mood and affect  Assessment & Plan:   1. Balanoposthitis Chronic, waxing and waning balanoposthitis/phimosis.  He has failed topical therapy, though will refill for symptom control while we await definitive management.  We discussed his treatment options including dorsal slit versus circumcision.  We discussed the dorsal slit is less invasive but typically has a less pleasing cosmetic appearance following the procedure.  By comparison, circumcision is more invasive but offers improved cosmesis.  With his multiple cardiopulmonary comorbidities, we could do either of these in the OR under MAC and penile block assuming we obtain cardiopulmonary clearance prior.  He does not have a strong preference regarding cosmesis.  Will plan for dorsal slit and obtain clearance as above prior.  If clearance is unable to be obtained, will pursue an office dorsal slit under local anesthesia.  They are in  agreement with this plan. - nystatin-triamcinolone ointment (MYCOLOG); Apply 1 Application topically 2 (two) times daily.  Dispense: 30 g; Refill: 2  2. Phimosis See #1 above.  3. Hemorrhoids, unspecified hemorrhoid type Refilling topical lidocaine ointment, encouraged follow-up with appropriate provider. - lidocaine (XYLOCAINE) 5 % ointment; Apply 1 Application topically 3 (three) times daily as needed.  Dispense: 35 g; Refill: 0   Return for Will call to schedule dorsal slit.  Debroah Loop, PA-C  [facility] [address], [place], [place] [address] [phone]

## 2022-10-21 ENCOUNTER — Inpatient Hospital Stay: Payer: Medicare HMO

## 2022-10-21 ENCOUNTER — Inpatient Hospital Stay: Payer: Medicare HMO | Attending: Oncology | Admitting: Oncology

## 2022-10-21 ENCOUNTER — Encounter: Payer: Self-pay | Admitting: Oncology

## 2022-10-21 VITALS — BP 149/54 | HR 60 | Temp 96.8°F | Resp 18 | Ht 67.0 in | Wt 195.0 lb

## 2022-10-21 DIAGNOSIS — D649 Anemia, unspecified: Secondary | ICD-10-CM | POA: Diagnosis present

## 2022-10-21 DIAGNOSIS — N189 Chronic kidney disease, unspecified: Secondary | ICD-10-CM | POA: Diagnosis present

## 2022-10-21 DIAGNOSIS — N184 Chronic kidney disease, stage 4 (severe): Secondary | ICD-10-CM | POA: Diagnosis not present

## 2022-10-21 DIAGNOSIS — D631 Anemia in chronic kidney disease: Secondary | ICD-10-CM

## 2022-10-21 LAB — IRON AND TIBC
Iron: 71 ug/dL (ref 45–182)
Saturation Ratios: 21 % (ref 17.9–39.5)
TIBC: 346 ug/dL (ref 250–450)
UIBC: 275 ug/dL

## 2022-10-21 LAB — CBC WITH DIFFERENTIAL (CANCER CENTER ONLY)
Abs Immature Granulocytes: 0.03 10*3/uL (ref 0.00–0.07)
Basophils Absolute: 0.1 10*3/uL (ref 0.0–0.1)
Basophils Relative: 1 %
Eosinophils Absolute: 0.3 10*3/uL (ref 0.0–0.5)
Eosinophils Relative: 3 %
HCT: 29.5 % — ABNORMAL LOW (ref 39.0–52.0)
Hemoglobin: 9.8 g/dL — ABNORMAL LOW (ref 13.0–17.0)
Immature Granulocytes: 0 %
Lymphocytes Relative: 21 %
Lymphs Abs: 2.1 10*3/uL (ref 0.7–4.0)
MCH: 33.1 pg (ref 26.0–34.0)
MCHC: 33.2 g/dL (ref 30.0–36.0)
MCV: 99.7 fL (ref 80.0–100.0)
Monocytes Absolute: 0.8 10*3/uL (ref 0.1–1.0)
Monocytes Relative: 8 %
Neutro Abs: 6.9 10*3/uL (ref 1.7–7.7)
Neutrophils Relative %: 67 %
Platelet Count: 222 10*3/uL (ref 150–400)
RBC: 2.96 MIL/uL — ABNORMAL LOW (ref 4.22–5.81)
RDW: 14.7 % (ref 11.5–15.5)
WBC Count: 10.3 10*3/uL (ref 4.0–10.5)
nRBC: 0 % (ref 0.0–0.2)

## 2022-10-21 LAB — FERRITIN: Ferritin: 233 ng/mL (ref 24–336)

## 2022-10-21 MED ORDER — DARBEPOETIN ALFA 200 MCG/0.4ML IJ SOSY
200.0000 ug | PREFILLED_SYRINGE | Freq: Once | INTRAMUSCULAR | Status: AC
  Administered 2022-10-21: 200 ug via SUBCUTANEOUS
  Filled 2022-10-21: qty 0.4

## 2022-10-21 NOTE — Progress Notes (Signed)
[facility]  [phone] [phone]  ID: Jay Morris OB: 1946/02/15  MR#: [id number]  [id number]  Patient Care Team: [name], MD as PCP - General (Internal Medicine) Lloyd Huger, MD as Consulting Physician (Oncology)  CHIEF COMPLAINT: Anemia, unspecified.  INTERVAL HISTORY: Patient returns to clinic today for further evaluation and continuation of Retacrit.  He currently feels well and is asymptomatic.  He does not complain of any weakness or fatigue. He has no neurologic complaints.  He denies any recent fevers or illnesses.  He has a good appetite and denies weight loss.  He has chronic shortness of breath requiring oxygen, but denies any chest pain, cough, or hemoptysis.  He denies any nausea, vomiting, constipation, or diarrhea.  He has no melena or hematochezia.  He has no urinary complaints.  Patient offers no further specific complaints today.  REVIEW OF SYSTEMS:   Review of Systems  Constitutional: Negative.  Negative for fever, malaise/fatigue and weight loss.  Respiratory:  Positive for shortness of breath. Negative for cough and hemoptysis.   Cardiovascular: Negative.  Negative for chest pain and leg swelling.  Gastrointestinal: Negative.  Negative for abdominal pain, blood in stool and melena.  Genitourinary: Negative.  Negative for hematuria.  Musculoskeletal: Negative.  Negative for back pain.  Skin: Negative.  Negative for rash.  Neurological: Negative.  Negative for dizziness, focal weakness, weakness and headaches.  Psychiatric/Behavioral: Negative.  The patient is not nervous/anxious.     As per HPI. Otherwise, a complete review of systems is negative.  PAST MEDICAL HISTORY: Past Medical History:  Diagnosis Date   (HFpEF) heart failure with preserved ejection fraction    a.) TTE 04/01/2016: EF 60-65%, LAE, MAC, mild MR/AR/TR, mild-mod PR, G2DD; b.) TTE 12/18/2018: EF 55%, mild LVH, mild AR/MR/TR. mod  PR; c.) TTE 05/31/2019: EF 60-65%, mild LVH, RAE/RVE, triv AR.MT, PR, mild TR; d.) TTE 10/26/2019: EF 55%, BAE, RVE, triv AR, mild MR/PR, sev TR, G2DD; e.) R/LHC 01/17/2020: mPA 52, PCWP 25, LVEDP 19, RVEDP 25, mRA 13   Anemia    Aortic atherosclerosis    Asthma    BPH (benign prostatic hyperplasia)    CKD (chronic kidney disease), stage III    COPD (chronic obstructive pulmonary disease)    Coronary artery disease 01/17/2020   a.) LHC 01/17/2020: 25% oLAD, 65% o-pRCA, 45% pLAD, 65% mLAD, 35% OM2 --> med mgmt.   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   PAF (paroxysmal atrial fibrillation)    a.) CHA2DS2-VASc = 6 (age x 2, HFpEF, HTN, vascular disease, T2DM);  b.) cardiac ablation in approx 2014-2015 at [facility]; c.) A.fib recurrence in setting of SARS-CoV-2 --> Tx'd with IV amiodarone + metoprolol + DCCV x 1 (200 J) 06/22/2019 restoring NSR; d.) rate/rhythm maintained without pharmacological intervention; chronically anticoagulated using standard dose apixaban   Pneumonia due to COVID-19 virus 06/02/2019   a.) hospitalized at [facility]) from 06/02/2019 - 06/25/2019; Tx'd with steriods + remdesivir + convalescent plasma   Pulmonary fibrosis    Pulmonary HTN 04/01/2016  a.) TTE 04/01/2016: EF 60-65%, RVSP 40; b.) TTE 12/18/2018: EF 55%, RVSP 45.9; c.) TTE 05/31/2019: EF 60-65%, RVSP 35.5; d.) TTE 10/26/2019: EF 55%, RVSP 100.4; e.) R/LHC 01/17/2020: mPA 52, PCWP 25, LVEDP 19, RVEDP 25, mRA 13; f.) Tx with PDE5i (sildenafil) + vasodilator (treprostinil)   Type 2 diabetes mellitus treated with insulin      PAST SURGICAL HISTORY: Past Surgical History:  Procedure Laterality Date   BACK SURGERY     x3 L3-L4    CARDIAC ELECTROPHYSIOLOGY MAPPING AND ABLATION N/A    performed at [place] in approximately 2014-2015   CARDIOVERSION N/A 06/22/2019   Procedure: CARDIOVERSION;  Surgeon: Fay Records, MD;  Location: [place];  Service: Cardiovascular;  Laterality: N/A;   COLONOSCOPY WITH PROPOFOL N/A 06/15/2017   Procedure: COLONOSCOPY WITH PROPOFOL;  Surgeon: Toledo, Benay Pike, MD;  Location: [facility] ENDOSCOPY;  Service: Gastroenterology;  Laterality: N/A;   ENTEROSCOPY N/A 11/24/2017   Procedure: ENTEROSCOPY;  Surgeon: Jonathon Bellows, MD;  Location: [facility] ENDOSCOPY;  Service: Gastroenterology;  Laterality: N/A;   ESOPHAGOGASTRODUODENOSCOPY N/A 11/27/2017   Procedure: ESOPHAGOGASTRODUODENOSCOPY (EGD);  Surgeon: Lucilla Lame, MD;  Location: [facility] ENDOSCOPY;  Service: Endoscopy;  Laterality: N/A;   ESOPHAGOGASTRODUODENOSCOPY (EGD) WITH PROPOFOL N/A 06/02/2017   Procedure: ESOPHAGOGASTRODUODENOSCOPY (EGD) WITH PROPOFOL;  Surgeon: Toledo, Benay Pike, MD;  Location: [facility] ENDOSCOPY;  Service: Gastroenterology;  Laterality: N/A;   ESOPHAGOGASTRODUODENOSCOPY (EGD) WITH PROPOFOL N/A 08/04/2021   Procedure: ESOPHAGOGASTRODUODENOSCOPY (EGD) WITH PROPOFOL;  Surgeon: Lesly Rubenstein, MD;  Location: [facility] ENDOSCOPY;  Service: Endoscopy;  Laterality: N/A;   EVALUATION UNDER ANESTHESIA WITH HEMORRHOIDECTOMY N/A 02/12/2022   Procedure: EXAM UNDER ANESTHESIA WITH HEMORRHOIDECTOMY;  Surgeon: Benjamine Sprague, DO;  Location: [facility] ORS;  Service: General;  Laterality: N/A;   GIVENS CAPSULE STUDY N/A 11/25/2017   Procedure: GIVENS CAPSULE STUDY;  Surgeon: Jonathon Bellows, MD;  Location: [facility] ENDOSCOPY;  Service: Gastroenterology;  Laterality: N/A;   HAND SURGERY     RIGHT/LEFT HEART CATH AND CORONARY ANGIOGRAPHY N/A 01/17/2020   Procedure: RIGHT/LEFT HEART CATH AND CORONARY ANGIOGRAPHY;  Surgeon: Corey Skains, MD;  Location:  [place] CV LAB;  Service: Cardiovascular;  Laterality: N/A;    FAMILY HISTORY: Family History  Problem Relation Age of Onset   Heart disease Mother    Cancer Father    Cancer Paternal Grandfather     ADVANCED DIRECTIVES (Y/N):  N  HEALTH MAINTENANCE: Social History   Tobacco Use   Smoking status: Former    Packs/day: 1.00    Years: 30.00    Additional pack years: 0.00    Total pack years: 30.00    Types: Cigarettes    Quit date: 1995    Years since quitting: 29.2   Smokeless tobacco: Never  Vaping Use   Vaping Use: Never used  Substance Use Topics   Alcohol use: No    Alcohol/week: 0.0 standard drinks of alcohol    Comment: occasional   Drug use: No     Colonoscopy:  PAP:  Bone density:  Lipid panel:  Allergies  Allergen Reactions   Shrimp [Shellfish Allergy] Anaphylaxis   Shrimp Extract     Current Outpatient Medications  Medication Sig Dispense Refill   acetaminophen (TYLENOL) 500 MG tablet Take 500-1,000 mg by mouth every 6 (six) hours as needed for moderate pain or headache. 30 tablet 0   allopurinol (ZYLOPRIM) 100 MG tablet Take 200 mg by mouth daily.     apixaban (ELIQUIS) 5 MG TABS tablet Take 5 mg by mouth  daily.     ascorbic acid (VITAMIN C) 500 MG tablet Take 500 mg by mouth daily.     atorvastatin (LIPITOR) 20 MG tablet Take 20 mg by mouth at bedtime.      calcitRIOL (ROCALTROL) 0.25 MCG capsule Take 0.25 mcg by mouth daily.     Cholecalciferol (DIALYVITE VITAMIN D 5000) 125 MCG (5000 UT) capsule Take 5,000 Units by mouth daily.     finasteride (PROSCAR) 5 MG tablet TAKE 1 TABLET BY MOUTH DAILY 30 tablet 11   fluticasone furoate-vilanterol (BREO ELLIPTA) 100-25 MCG/ACT AEPB Inhale 1 puff into the lungs daily.     furosemide (LASIX) 20 MG tablet Take 20 mg by mouth daily.     insulin aspart (NOVOLOG) 100 UNIT/ML FlexPen Inject 35-45 Units into the skin 3 (three) times daily with meals. (Based upon blood glucose reading)     insulin degludec  (TRESIBA FLEXTOUCH) 200 UNIT/ML FlexTouch Pen Inject 60 Units into the skin at bedtime.     lidocaine (XYLOCAINE) 5 % ointment Apply 1 Application topically 3 (three) times daily as needed. 35 g 0   lisinopril (ZESTRIL) 5 MG tablet Take 5 mg by mouth daily.     magnesium oxide (MAG-OX) 400 MG tablet Take 400 mg by mouth 3 (three) times daily.     metoprolol tartrate (LOPRESSOR) 25 MG tablet Take 2 tablets (50 mg total) by mouth 2 (two) times daily.     nystatin-triamcinolone ointment (MYCOLOG) Apply 1 Application topically 2 (two) times daily. 30 g 2   pantoprazole (PROTONIX) 40 MG tablet Take 1 tablet (40 mg total) by mouth 2 (two) times daily before a meal. 60 tablet 0   sildenafil (REVATIO) 20 MG tablet Take 20 mg by mouth 3 (three) times daily.     tamsulosin (FLOMAX) 0.4 MG CAPS capsule Take 0.4 mg by mouth daily after supper.     Treprostinil (TYVASO) 0.6 MG/ML SOLN Inhale 18 mcg into the lungs 3 (three) times daily.     vitamin B-12 (CYANOCOBALAMIN) 1000 MCG tablet Take 1,000 mcg by mouth daily.     colchicine 0.6 MG tablet Take 0.6 mg by mouth daily. (Patient not taking: Reported on 10/21/2022)     No current facility-administered medications for this visit.   Facility-Administered Medications Ordered in Other Visits  Medication Dose Route Frequency Provider Last Rate Last Admin   Darbepoetin Alfa (ARANESP) injection 200 mcg  200 mcg Subcutaneous Once Lloyd Huger, MD        OBJECTIVE: Vitals:   10/21/22 1331  BP: (!) 149/54  Pulse: 60  Resp: 18  Temp: (!) 96.8 F (36 C)  SpO2: 94%     Body mass index is 30.54 kg/m.    ECOG FS:1 - Symptomatic but completely ambulatory  General: Well-developed, well-nourished, no acute distress.  Sitting in a wheelchair. Eyes: Pink conjunctiva, anicteric sclera. HEENT: Normocephalic, moist mucous membranes. Lungs: No audible wheezing or coughing. Heart: Regular rate and rhythm. Abdomen: Soft, nontender, no obvious  distention. Musculoskeletal: No edema, cyanosis, or clubbing. Neuro: Alert, answering all questions appropriately. Cranial nerves grossly intact. Skin: No rashes or petechiae noted. Psych: Normal affect.  LAB RESULTS:  Lab Results  Component Value Date   NA 136 05/30/2022   K 4.6 05/30/2022   CL 99 05/30/2022   CO2 28 05/30/2022   GLUCOSE 281 (H) 05/30/2022   BUN 61 (H) 05/30/2022   CREATININE 2.40 (H) 05/30/2022   CALCIUM 9.5 05/30/2022   PROT 6.5 05/30/2022   ALBUMIN  3.7 05/30/2022   AST 45 (H) 05/30/2022   ALT 68 (H) 05/30/2022   ALKPHOS 31 (L) 05/30/2022   BILITOT 0.7 05/30/2022   GFRNONAA 27 (L) 05/30/2022   GFRAA 39 (L) 10/18/2019    Lab Results  Component Value Date   WBC 10.3 10/21/2022   NEUTROABS 6.9 10/21/2022   HGB 9.8 (L) 10/21/2022   HCT 29.5 (L) 10/21/2022   MCV 99.7 10/21/2022   PLT 222 10/21/2022   Lab Results  Component Value Date   IRON 52 07/22/2022   TIBC 339 07/22/2022   IRONPCTSAT 15 (L) 07/22/2022   Lab Results  Component Value Date   FERRITIN 205 07/22/2022     STUDIES: No results found.   ASSESSMENT: Anemia, unspecified.  PLAN:   Anemia, unspecified: Bone marrow biopsy completed on June 25, 2020 was essentially negative only with a hypercellular marrow for age.  Patient was noted to have decreased iron stores and rare ringed sideroblasts.  FISH and cytogenetics were negative.  Recent iron stores were decreased and patient received Venofer x 5 with his most recent treatment on May 06, 2022.  Repeat iron stores are pending at time of dictation. Previously, repeat B12, folate, and hemolysis labs were found to be either negative or within normal limits.  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  Patient hemoglobin remains below 10.0 at 9.8, therefore proceed with 200 mcg Aranesp.  Iron stores are pending at time of dictation.  Return to clinic in 3 months with repeat laboratory  work, further evaluation, and continuation of treatment if needed.   MGUS: Patient previously noted to have an M spike of 0.3, but repeat laboratory work in October 2023 this was no longer observed.  Both kappa and lambda free light chains are only mildly elevated and immunoglobulins are within normal limits.  Bone marrow biopsy only revealed approximately 1% plasma cells.  Likely clinically insignificant. Chronic renal insufficiency:.  Chronic and unchanged.  Patient's most recent creatinine was 2.40.  Continue follow-up with nephrology as indicated. Hypertension: Patient's blood pressure is mildly elevated today.  Continue monitoring and treatment per primary care.  Patient expressed understanding and was in agreement with this plan. He also understands that He can call clinic at any time with any questions, concerns, or complaints.    Lloyd Huger, MD   10/21/2022 1:51 PM

## 2022-10-22 ENCOUNTER — Telehealth: Payer: Self-pay

## 2022-10-22 NOTE — Telephone Encounter (Signed)
I spoke with Jay Morris and his wife. We have discussed possible surgery dates and Tuesday April 16th, 2024 was agreed upon by all parties. Patient given information about surgery date, what to expect pre-operatively and post operatively.  We discussed that a Pre-Admission Testing office will be calling to set up the pre-op visit that will take place prior to surgery, and that these appointments are typically done over the phone with a Pre-Admissions RN. Informed patient that our office will communicate any additional care to be provided after surgery. Patients questions or concerns were discussed during our call. Advised to call our office should there be any additional information, questions or concerns that arise. Patient verbalized understanding.

## 2022-10-22 NOTE — Progress Notes (Signed)
  Phone Number: [phone] for Surgical Coordinator Fax Number: [phone]  REQUEST FOR SURGICAL CLEARANCE       Date: Date: 10/22/22  Faxed to: [facility]  Surgeon: Dr. Irineo Axon, MD     Date of Surgery: 11/02/2022  Operation: Dorsal Slit   Anesthesia Type: MAC + Penile Block   Diagnosis: Phimosis  Patient Requires:   Cardiac / Vascular Clearance : Yes  Reason: Will need patient to hold Eliquis prior to surgery   Risk Assessment:    Low   []       Moderate   []     High   []           This patient is optimized for surgery  YES []       NO   []    I recommend further assessment/workup prior to surgery. YES []      NO  []   Appointment scheduled for: _______________________   Further recommendations: ____________________________________     Physician Signature:__________________________________   Printed Name: ________________________________________   Date: _________________

## 2022-10-22 NOTE — Progress Notes (Addendum)
   [facility] Surgical Posting Form  Surgery Date: 12/21/2022  Surgeon: Dr. Irineo Axon, MD  Inpt ( No  )   Outpt (Yes)   Obs ( No  )   Diagnosis: N47.1 Phimosis  -CPT: 54001  Surgery: Dorsal Slit  Stop Anticoagulations: Yes will need to hold Eliquis  Cardiac/Medical/Pulmonary Clearance needed: yes  Clearance needed from Dr: [facility]   Clearance request sent on: Date: 10/22/22  *Orders entered into EPIC  Date: 10/22/22   *Case booked in EPIC  Date: 10/21/2022  *Notified pt of Surgery: Date: 10/21/2022  PRE-OP UA & CX: no  *Placed into Prior Authorization Work [place] Date: 10/22/22  Assistant/laser/rep:No

## 2022-10-27 ENCOUNTER — Other Ambulatory Visit: Payer: Self-pay

## 2022-10-27 ENCOUNTER — Encounter
Admission: RE | Admit: 2022-10-27 | Discharge: 2022-10-27 | Disposition: A | Discharge status: Home / Self Care | Payer: Medicare HMO | Source: Ambulatory Visit | Attending: Urology | Admitting: Urology

## 2022-10-27 DIAGNOSIS — Z01812 Encounter for preprocedural laboratory examination: Secondary | ICD-10-CM

## 2022-10-27 HISTORY — DX: Heart failure, unspecified: I50.9

## 2022-10-27 HISTORY — DX: Peripheral vascular disease, unspecified: I73.9

## 2022-10-27 HISTORY — DX: Dyspnea, unspecified: R06.00

## 2022-10-27 NOTE — Patient Instructions (Addendum)
Your procedure is scheduled on: 11/02/22 - Tuesday Report to the Registration Desk on the 1st floor of the [facility]. To find out your arrival time, please call [phone] between 1PM - 3PM on: 11/01/22 - Monday If your arrival time is 6:00 am, do not arrive before that time as the [facility] entrance doors do not open until 6:00 am.  REMEMBER: Instructions that are not followed completely may result in serious medical risk, up to and including death; or upon the discretion of your surgeon and anesthesiologist your surgery may need to be rescheduled.  Do not eat food or drink any liquids after midnight the night before surgery.  No gum chewing or hard candies.  One week prior to surgery: Stop taking beginning 10/27/22, Anti-inflammatories (NSAIDS) such as Advil, Aleve, Ibuprofen, Motrin, Naproxen, Naprosyn and Aspirin based products such as Excedrin, Goody's Powder, BC Powder.  Stop ANY OVER THE COUNTER supplements until after surgery beginning 10/27/22.  You may however, continue to take Tylenol if needed for pain up until the day of surgery.  Continue taking all prescribed medications with the exception of the following:  apixaban (ELIQUIS) hold beginning 10/30/22. insulin degludec (TRESIBA FLEXTOUCH) inject 1/2 prescribed dose of insulin on the night before your surgery. Hold insulin aspart (NOVOLOG) on the morning of surgery, may resume with meals.   TAKE ONLY THESE MEDICATIONS THE MORNING OF SURGERY WITH A SIP OF WATER:  pantoprazole (PROTONIX) - (take one the night before and one on the morning of surgery - helps to prevent nausea after surgery.) allopurinol (ZYLOPRIM)  finasteride (PROSCAR)  BREO ELLIPTA  metoprolol tartrate (LOPRESSOR)  Treprostinil (TYVASO)  sildenafil (REVATIO)   Use inhalers on the day of surgery and bring to the hospital.  No Alcohol for 24 hours before or after surgery.  No Smoking including e-cigarettes for 24 hours before surgery.  No  chewable tobacco products for at least 6 hours before surgery.  No nicotine patches on the day of surgery.  Do not use any "recreational" drugs for at least a week (preferably 2 weeks) before your surgery.  Please be advised that the combination of cocaine and anesthesia may have negative outcomes, up to and including death. If you test positive for cocaine, your surgery will be cancelled.  On the morning of surgery brush your teeth with toothpaste and water, you may rinse your mouth with mouthwash if you wish. Do not swallow any toothpaste or mouthwash.  Do not wear jewelry, make-up, hairpins, clips or nail polish.  Do not wear lotions, powders, or perfumes.   Do not shave body hair from the neck down 48 hours before surgery.  Contact lenses, hearing aids and dentures may not be worn into surgery.  Do not bring valuables to the hospital. [facility] is not responsible for any missing/lost belongings or valuables.   Notify your doctor if there is any change in your medical condition (cold, fever, infection).  Wear comfortable clothing (specific to your surgery type) to the hospital.  After surgery, you can help prevent lung complications by doing breathing exercises.  Take deep breaths and cough every 1-2 hours. Your doctor may order a device called an Incentive Spirometer to help you take deep breaths. When coughing or sneezing, hold a pillow firmly against your incision with both hands. This is called "splinting." Doing this helps protect your incision. It also decreases belly discomfort.  If you are being admitted to the hospital overnight, leave your suitcase in the car. After surgery  it may be brought to your room.  In case of increased patient census, it may be necessary for you, the patient, to continue your postoperative care in the Same Day Surgery department.  If you are being discharged the day of surgery, you will not be allowed to drive home. You will need a responsible  individual to drive you home and stay with you for 24 hours after surgery.   If you are taking public transportation, you will need to have a responsible individual with you.  Please call the Pre-admissions Testing Dept. at [phone] if you have any questions about these instructions.  Surgery Visitation Policy:  Patients having surgery or a procedure may have two visitors.  Children under the age of 59 must have an adult with them who is not the patient.  Inpatient Visitation:    Visiting hours are 7 a.m. to 8 p.m. Up to four visitors are allowed at one time in a patient room. The visitors may rotate out with other people during the day.  One visitor age 76 or older may stay with the patient overnight and must be in the room by 8 p.m.

## 2022-10-28 ENCOUNTER — Encounter: Payer: Self-pay | Admitting: Urgent Care

## 2022-10-28 ENCOUNTER — Encounter: Payer: Self-pay | Admitting: Urology

## 2022-10-28 NOTE — Progress Notes (Signed)
Perioperative / Anesthesia Services  Pre-Admission Testing Clinical Review / Preoperative Anesthesia Consult  Date: 10/28/22  Patient Demographics:  Name: Jay Morris DOB:   24-Jun-1946 MRN:   [id number]  Planned Surgical Procedure(s):    Case: [id number] Date/Time: 11/02/22 1202   Procedure: DORSAL SLIT   Anesthesia type: Monitor Anesthesia Care   Pre-op diagnosis: Phimosis   Location: [facility] OR ROOM 10 / [facility]S FOR ANEST[facility]IA [facility]   Surgeons: Riki Altes, MD     NOTE: Available PAT nursing documentation and vital signs have been reviewed. Clinical nursing staff has updated patient's PMH/PSHx, current medication list, and drug allergies/intolerances to ensure comprehensive history available to assist in medical decision making as it pertains to the aforementioned surgical procedure and anticipated anesthetic course. Extensive review of available clinical information personally performed. [place] [facility] and PSHx updated with any diagnoses/procedures that  may have been inadvertently omitted during his intake with the pre-admission testing department's nursing staff.  Clinical Discussion:  Jay Morris is a 77 y.o. male who is submitted for pre-surgical anesthesia review and clearance prior to him undergoing the above procedure.Patient is a Former Smoker (30 pack years; quit 07/1993). Pertinent PMH includes: CAD, HFpEF, PAF, PVD, aortic atherosclerosis, HTN, HLD, T2DM, CKD-III, pulmonary hypertension (pn PDE5i + vasodilator), pulmonary fibrosis, COPD, asthma, GERD (on daily PPI), hemorrhoids, anemia, BPH, phimosis, lumbar DDD.   Patient is followed by cardiology Gwen Pounds, MD). He was last seen in the cardiology clinic on 06/08/2022; notes reviewed.  At the time of his clinic visit, patient reportedly doing well overall from a cardiovascular perspective.  Patient with chronic dyspnea both at rest and with exertion related to his underlying pulmonary hypertension, COPD, and asthma  diagnoses. Patient is being jointly managed by pulmonary medicine. Patient admitted for acute on chronic respiratory failure secondary to SARS-CoV-2 pneumonia from 05/27/2022 - 05/30/2022; notes reviewed.  Patient received intravenous steroids + remdesivir with noted improvement; discharged home in stable condition. He reported that his breathing had improved overall since admission. During his visit with cardiology, patient denied any chest pain, PND, orthopnea, palpitations, significant peripheral edema, weakness, fatigue, vertiginous symptoms, or presyncope/syncope. Patient with a past medical history significant for cardiovascular diagnoses. Documented physical exam was grossly benign, providing no evidence of acute exacerbation and/or decompensation of the patient's known cardiovascular conditions.  Diagnostic LEFT heart catheterization was performed on 12/30/2006 revealing insignificant nonobstructive CAD; 20% ostial LAD and 10% LCx.  Intervention was deferred opted for medical management.   Patient underwent cardiac ablation for atrial fibrillation in approximately 2014-2015 at [facility].   Patient with a history of pulmonary hypertension diagnosed via TTE back on 04/01/2016.  At that time, RVSP elevated at 40 mmHg.     Last TTE was performed on 10/26/2019 revealing normal left ventricular systolic function with mild LVH; LVEF >55%..  There were no regional wall motion abnormalities. Diastolic Doppler parameters consistent with pseudonormalization (G2DD).  There was mild biatrial and mild right ventricular enlargement.  Trivial aortic, mild mitral/pulmonic, and severe tricuspid valve regurgitation noted.  There was no evidence of a significant transvalvular gradient to suggest stenosis.  Of note, pulmonary hypertension had progressed to a severe degree; RVSP 100.4 mmHg. Pulmonary function felt to have worsened secondary to after effects of SARS-CoV-2 infection.  Patient developed SARS-CoV-2 pneumonia on  05/30/2019 and was hospitalized at [facility] in [place], [place] between 06/02/2019 and 06/25/2019.    In the setting of his SARS-CoV-2 infection, patient had a recurrence of his atrial fibrillation. Patient  was treated with IV amiodarone + metoprolol.  He underwent DCCV on 06/22/2019, whereby he received a single 200 J cardioversion, which restored NSR.  He has had no recurrences since.     Diagnostic RIGHT/LEFT heart catheterization performed on 01/17/2020.  There was multivessel CAD; 25% ostial LAD, 65% ostial proximal RCA, 45% proximal LAD, 65% mid LAD, and 35% OM2.  Intervention was deferred opting for medical management.  Hemodynamics: mean PA pressure = 52 mmHg, PCWP  = 25 mmHg, LVEDP = 19 mmHg, RVEDP = 25 mmHg, and mean RA pressure = 13 mmHg. CO = 6.53 L/min.  CI = 3.16 L/min/m.   Patient with an atrial fibrillation diagnosis; CHA2DS2-VASc Score = 6 (age x 2, HFpEF, HTN, vascular disease history, T2DM). His rate and rhythm are currently being maintained without the use of pharmacological interventions. He is chronically anticoagulated using standard dose apixaban; reported to be compliant with therapy with no evidence or reports of GI bleeding. Pulmonary hypertension being managed with a PDE5i (sildenafil) and vasodilator (treprostinil). Blood pressure reasonable controlled at 130/60 mmHg on currently prescribed diuretic (furosemide) and ACEi (lisinopril) therapies. He is on atorvastatin for his HLD diagnosis and further ASCVD prevention. T2DM suboptimally controlled on currently prescribed regimen; last HgbA1c was 8.1% when checked on 04/14/2022.  Of note, since patient was seen by cardiology, HgbA1c value has been rechecked with improvement to 7.6% on 09/07/2022.  Functional capacity limited by patient's age and multiple cardiopulmonary diagnoses.  With that being said, patient questionably able to achieve 4 METS of physical activity without experiencing varying degrees of significant  angina/anginal equivalent symptoms.  No changes were made to his medication regimen.  Patient to follow-up with outpatient cardiology in 6 months or sooner if needed.  Derrian Poli is scheduled for a PENILE DORSAL SLIT on 11/02/2022 with Dr. Irineo Axon, MD.  Given patient's past medical history significant for cardiovascular and cardiopulmonary diagnoses, presurgical clearances from patient's cardiology and pulmonary medicine teams were sought. Specialty clearances were obtained as follows.  Per pulmonary medicine Meredeth Ide, MD), "this patient is optimized for surgery and may proceed with the planned procedural course with a MODERATE risk of significant perioperative cardiopulmonary complications".  Per cardiology Madaline Guthrie, PA-C), "this patient is optimized for surgery and may proceed with the planned procedural course with a LOW risk of significant perioperative cardiovascular complications".  As previously mentioned, patient is on daily anticoagulation therapy. He has been instructed on recommendations from his cardiologist for holding his apixaban for 3 days prior to his procedure with plans to restart since postoperative bleeding risk felt to be minimized by his primary attending surgeon. The patient is aware that his last dose of apixaban should be on 10/29/2022.    Patient denies previous perioperative complications with anesthesia in the past. In review of the available records, it is noted that patient underwent a general anesthetic course here at [facility] ([facility]) in 01/2022 without documented complications.      10/21/2022    1:31 PM 10/20/2022    2:16 PM 07/30/2022    1:28 PM  Vitals with BMI  Height 5\' 7"  5\' 7"    Weight 195 lbs 191 lbs 191 lbs  BMI 30.53 29.91   Systolic 149 122 696  Diastolic 54 64 69  Pulse 60 50 58    Providers/Specialists:   NOTE: Primary physician provider listed below. Patient may have been seen by APP or partner  within same practice.   PROVIDER ROLE / SPECIALTY LAST  Winfield Cunas, MD Urology (Surgeon) 10/20/2022  Marguarite Arbour, MD Primary Care Provider 09/07/2022  Arnoldo Hooker, MD Cardiology 06/08/2022  Gerarda Fraction, MD Medical Oncology / Hematology 10/21/2022  Wendall Mola, MD Endocrinology 09/20/2022  Ned Clines, MD Pulmonary Medicine 09/15/2022   Allergies:  Shrimp [shellfish allergy] and Shrimp extract  Current Home Medications:   No current facility-administered medications for this encounter.    acetaminophen (TYLENOL) 500 MG tablet   allopurinol (ZYLOPRIM) 100 MG tablet   apixaban (ELIQUIS) 5 MG TABS tablet   ascorbic acid (VITAMIN C) 500 MG tablet   atorvastatin (LIPITOR) 20 MG tablet   calcitRIOL (ROCALTROL) 0.25 MCG capsule   Cholecalciferol (DIALYVITE VITAMIN D 5000) 125 MCG (5000 UT) capsule   colchicine 0.6 MG tablet   finasteride (PROSCAR) 5 MG tablet   fluticasone furoate-vilanterol (BREO ELLIPTA) 100-25 MCG/ACT AEPB   furosemide (LASIX) 20 MG tablet   insulin aspart (NOVOLOG) 100 UNIT/ML FlexPen   insulin degludec (TRESIBA FLEXTOUCH) 200 UNIT/ML FlexTouch Pen   lidocaine (XYLOCAINE) 5 % ointment   lisinopril (ZESTRIL) 5 MG tablet   magnesium oxide (MAG-OX) 400 MG tablet   metoprolol tartrate (LOPRESSOR) 25 MG tablet   nystatin-triamcinolone ointment (MYCOLOG)   pantoprazole (PROTONIX) 40 MG tablet   sildenafil (REVATIO) 20 MG tablet   tamsulosin (FLOMAX) 0.4 MG CAPS capsule   Treprostinil (TYVASO) 0.6 MG/ML SOLN   vitamin B-12 (CYANOCOBALAMIN) 1000 MCG tablet   History:   Past Medical History:  Diagnosis Date   (HFpEF) heart failure with preserved ejection fraction    a.) TTE 04/01/2016: EF 60-65%, LAE, MAC, mild MR/AR/TR, mild-mod PR, G2DD; b.) TTE 12/18/2018: EF 55%, mild LVH, mild AR/MR/TR. mod PR; c.) TTE 05/31/2019: EF 60-65%, mild LVH, RAE/RVE, triv AR.MT, PR, mild TR; d.) TTE 10/26/2019: EF 55%, BAE, RVE, triv AR, mild MR/PR, sev  TR, G2DD; e.) R/LHC 01/17/2020: mPA 52, PCWP 25, LVEDP 19, RVEDP 25, mRA 13   Anemia    Aortic atherosclerosis    Asthma    BPH (benign prostatic hyperplasia)    CKD (chronic kidney disease), stage III    COPD (chronic obstructive pulmonary disease)    Coronary artery disease 01/17/2020   a.) LHC 01/17/2020: 25% oLAD, 65% o-pRCA, 45% pLAD, 65% mLAD, 35% OM2 --> med mgmt.   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   PAF (paroxysmal atrial fibrillation)    a.) CHA2DS2-VASc = 6 (age x 2, HFpEF, HTN, vascular disease, T2DM);  b.) cardiac ablation in approx 2014-2015 at [facility]; c.) A.fib recurrence in setting of SARS-CoV-2 --> Tx'd with IV amiodarone + metoprolol + DCCV x 1 (200 J) 06/22/2019 restoring NSR; d.) rate/rhythm maintained without pharmacological intervention; chronically anticoagulated using standard dose apixaban   Peripheral vascular disease    Phimosis of penis    Pneumonia due to COVID-19 virus 06/02/2019   a.) hospitalized at [facility] from 06/02/2019 - 06/25/2019; Tx'd with steriods + remdesivir + convalescent plasma   Pneumonia due to COVID-19 virus 05/27/2022  a.) hospitalized at [facility] from 05/27/2022 - 05/30/2022 for acute on chronic respiratory failure secondary to SARS-CoV-2 PNA ; Tx'd with IV steroids + remdesivir   Pulmonary fibrosis    Pulmonary HTN 04/01/2016   a.) TTE 04/01/2016: EF 60-65%, RVSP 40; b.) TTE 12/18/2018: EF 55%, RVSP 45.9; c.) TTE 05/31/2019: EF 60-65%, RVSP 35.5; d.) TTE 10/26/2019: EF 55%, RVSP 100.4;  e.) R/LHC 01/17/2020: mPA 52, PCWP 25, LVEDP 19, RVEDP 25, mRA 13; f.) Tx with PDE5i (sildenafil) + vasodilator (treprostinil)   Type 2 diabetes mellitus treated with insulin    Past Surgical History:  Procedure Laterality Date   BACK SURGERY     x3 L3-L4    CARDIAC ELECTROPHYSIOLOGY MAPPING AND ABLATION N/A    performed at [facility] in approximately 2014-2015   CARDIOVERSION N/A 06/22/2019   Procedure: CARDIOVERSION;  Surgeon: Pricilla Riffle, MD;  Location: [facility] ENDOSCOPY;  Service: Cardiovascular;  Laterality: N/A;   COLONOSCOPY WITH PROPOFOL N/A 06/15/2017   Procedure: COLONOSCOPY WITH PROPOFOL;  Surgeon: Toledo, Boykin Nearing, MD;  Location: [facility] ENDOSCOPY;  Service: Gastroenterology;  Laterality: N/A;   ENTEROSCOPY N/A 11/24/2017   Procedure: ENTEROSCOPY;  Surgeon: Wyline Mood, MD;  Location: [facility] ENDOSCOPY;  Service: Gastroenterology;  Laterality: N/A;   ESOPHAGOGASTRODUODENOSCOPY N/A 11/27/2017   Procedure: ESOPHAGOGASTRODUODENOSCOPY (EGD);  Surgeon: Midge Minium, MD;  Location: [facility] ENDOSCOPY;  Service: Endoscopy;  Laterality: N/A;   ESOPHAGOGASTRODUODENOSCOPY (EGD) WITH PROPOFOL N/A 06/02/2017   Procedure: ESOPHAGOGASTRODUODENOSCOPY (EGD) WITH PROPOFOL;  Surgeon: Toledo, Boykin Nearing, MD;  Location: [facility] ENDOSCOPY;  Service: Gastroenterology;  Laterality: N/A;   ESOPHAGOGASTRODUODENOSCOPY (EGD) WITH PROPOFOL N/A 08/04/2021   Procedure: ESOPHAGOGASTRODUODENOSCOPY (EGD) WITH PROPOFOL;  Surgeon: Regis Bill, MD;  Location: [facility] ENDOSCOPY;  Service: Endoscopy;  Laterality: N/A;   EVALUATION UNDER ANESTHESIA WITH HEMORRHOIDECTOMY N/A 02/12/2022   Procedure: EXAM UNDER ANESTHESIA WITH HEMORRHOIDECTOMY;  Surgeon: Sung Amabile, DO;  Location: [facility] ORS;  Service: General;  Laterality: N/A;   GIVENS CAPSULE STUDY N/A 11/25/2017   Procedure: GIVENS CAPSULE STUDY;  Surgeon: Wyline Mood, MD;  Location: [facility] ENDOSCOPY;  Service: Gastroenterology;  Laterality: N/A;   HAND SURGERY     RIGHT/LEFT HEART  CATH AND CORONARY ANGIOGRAPHY N/A 01/17/2020   Procedure: RIGHT/LEFT HEART CATH AND CORONARY ANGIOGRAPHY;  Surgeon: Lamar Blinks, MD;  Location: [facility] INVASIVE CV LAB;  Service: Cardiovascular;  Laterality: N/A;   Family History  Problem Relation Age of Onset   Heart disease Mother    Cancer Father    Cancer Paternal Grandfather    Social History   Tobacco Use   Smoking status: Former    Packs/day: 1.00    Years: 30.00    Additional pack years: 0.00    Total pack years: 30.00    Types: Cigarettes    Quit date: 1995    Years since quitting: 29.2   Smokeless tobacco: Never  Vaping Use   Vaping Use: Never used  Substance Use Topics   Alcohol use: No    Alcohol/week: 0.0 standard drinks of alcohol    Comment: occasional   Drug use: No    Pertinent Clinical Results:  LABS:   Lab Results  Component Value Date   WBC 10.3 10/21/2022   HGB 9.8 (L) 10/21/2022   HCT 29.5 (L) 10/21/2022   MCV 99.7 10/21/2022   PLT 222 10/21/2022   Lab Results  Component Value Date   NA 136 05/30/2022   K 4.6 05/30/2022   CO2 28 05/30/2022   GLUCOSE 281 (  H) 05/30/2022   BUN 61 (H) 05/30/2022   CREATININE 2.40 (H) 05/30/2022   CALCIUM 9.5 05/30/2022   GFRNONAA 27 (L) 05/30/2022    ECG: Date: 05/27/2022 Time ECG obtained: 0604 AM Rate: 101 bpm Rhythm: sinus tachycardia Axis (leads I and aVF): Normal Intervals: PR 204 ms. QRS 81 ms. QTc 436 ms. ST segment and T wave changes: No evidence of acute ST segment elevation or depression Comparison: Similar to previous tracing obtained on 02/18/2022    IMAGING / PROCEDURES: PULMONARY FUNCTION TESTING performed on 09/15/2022 SPIROMETRY: FVC was 3.03 liters, 109% of predicted FEV1 was 2.32, 107% of predicted FEV1 ratio was 76.30 FEF 25-75% liters per second was 91% of predicted  LUNG VOLUMES: TLC was 78% of predicted RV was 68% of predicted  DIFFUSION CAPACITY: DLCO was 31% of predicted DLCO/VA was 36% of predicted  FLOW  VOLUME LOOP: Normal contour  IMPRESSIONS: Spirometry is c/w moderate obstruction/copd TLC is slightly decreased DLCO is very severely decreased   DIAGNOSTIC RADIOGRAPHS OF CHEST PORTABLE 1 VIEW performed on 05/27/2022 Marked chronic interstitial lung disease again noted.  No evidence for superimposed new focal consolidative opacity.  No pleural effusion.  The cardio pericardial silhouette is enlarged.  Bones are diffusely demineralized.  Telemetry leads overlie the chest.  CT PELVIS WO CONTRAST performed on 04/26/2022 No explanation for rectal pain. No rectal inflammation, perirectal collection or obvious mass on this unenhanced exam. Lobulated contour of the bladder dome with mild circumferential bladder wall thickening. This may be secondary to chronic bladder outlet obstruction, however recommend correlation with urinalysis to exclude urinary tract infection. Aortic atherosclerosis   RIGHT/LEFT HEART CATHETERIZATION AND CORONARY ANGIOGRAPHY performed on 01/17/2020 Normal left ventricular systolic function Multivessel CAD 25% stenosis of the ostial LAD 65% stenosis of the ostial to proximal RCA 45% stenosis of the proximal LAD 65% stenosis of the mid LAD 35% stenosis of the OM2 Hemodynamics LVEDP 19 mmHg RVEDP 25 mmHg Mean PA pressure 52 mmHg PCWP 25 mmHg Mean RAP 13 mmHg Recommendations Severe pulmonary hypertension possibly secondary to SARS-CoV-2 infection and pulmonary injury Aggressive medical management of risk factor modification for further risk for progression of CAD, including control of HTN, HLD, and antiplatelet therapy. Further treatment of severe pulmonary hypertension as per pulmonology No further cardiovascular intervention at this time Further consideration of stress test evaluation of any concerns of possible myocardial ischemia in the future   TRANSTHORACIC ECHOCARDIOGRAM performed on 05/31/2019 Left ventricular ejection fraction, by visual estimation,  is 60 to 65%. The left ventricle has normal function. Left ventricular septal wall thickness was mildly increased. Mildly increased left ventricular posterior wall thickness. There is mildly increased left ventricular hypertrophy.  Global right ventricle has normal systolic function.The right ventricular size is mildly enlarged. No increase in right ventricular wall thickness.  Left atrial size was normal.  Right atrial size was mildly dilated.  The mitral valve was not well visualized. Trace mitral valve regurgitation.  The tricuspid valve is not well visualized. Tricuspid valve regurgitation is mild.  The aortic valve is grossly normal. Aortic valve regurgitation is trivial.  The pulmonic valve was not well visualized. Pulmonic valve regurgitation is trivial.  The aortic root was not well visualized.  Mildly elevated pulmonary artery systolic pressure.  The atrial septum is grossly normal.   Impression and Plan:  Liz MaladyCharles Rotundo has been referred for pre-anesthesia review and clearance prior to him undergoing the planned anesthetic and procedural courses. Available labs, pertinent testing, and imaging results were personally reviewed  by me in preparation for upcoming operative/procedural course. [facility] medical record has been updated following extensive record review and patient interview with PAT staff.   This patient has been appropriately cleared by cardiology (LOW) and pulmonary medicine (MODERATE) with the individually indicated risks of significant perioperative cardiovascular/cardiopulmonary complications. Based on clinical review performed today (10/28/22), barring any significant acute changes in the patient's overall condition, it is anticipated that he will be able to proceed with the planned surgical intervention. Any acute changes in clinical condition may necessitate his procedure being postponed and/or cancelled. Patient will meet with anesthesia team (MD and/or CRNA) on the day  of his procedure for preoperative evaluation/assessment. Questions regarding anesthetic course will be fielded at that time.   Pre-surgical instructions were reviewed with the patient during his PAT appointment, and questions were fielded to satisfaction by PAT clinical staff. He has been instructed on which medications that he will need to hold prior to surgery, as well as the ones that have been deemed safe/appropriate to take of the day of his procedure. As part of the general education provided by PAT, patient made aware both verbally and in writing, that he would need to abstain from the use of any illegal substances during his perioperative course.  He was advised that failure to follow the provided instructions could necessitate case cancellation or result serious perioperative complications up to and including death. Patient encouraged to contact PAT and/or his surgeon's office to discuss any questions or concerns that may arise prior to surgery; verbalized understanding.   Quentin Mulling, MSN, APRN, FNP-C, CEN [facility]  Peri-operative Services Nurse Practitioner Phone: [phone] Fax: [phone] 10/28/22 10:30 AM  NOTE: This note has been prepared using Dragon dictation software. Despite my best ability to proofread, there is always the potential that unintentional transcriptional errors may still occur from this process.

## 2022-11-02 ENCOUNTER — Ambulatory Visit: Admission: RE | Admit: 2022-11-02 | Payer: Medicare HMO | Source: Ambulatory Visit | Admitting: Urology

## 2022-11-02 ENCOUNTER — Encounter: Admission: RE | Payer: Self-pay | Source: Ambulatory Visit

## 2022-11-02 HISTORY — DX: Phimosis: N47.1

## 2022-11-02 SURGERY — DORSAL SLIT, PREPUCE
Anesthesia: Monitor Anesthesia Care

## 2022-11-17 ENCOUNTER — Ambulatory Visit: Payer: Medicare HMO | Admitting: Urology

## 2022-12-13 ENCOUNTER — Inpatient Hospital Stay: Admission: RE | Admit: 2022-12-13 | Payer: Medicare HMO | Source: Ambulatory Visit

## 2022-12-14 NOTE — Addendum Note (Signed)
Addended by: Letta Kocher A on: 12/14/2022 02:54 PM   Modules accepted: Orders

## 2022-12-15 ENCOUNTER — Encounter
Admission: RE | Admit: 2022-12-15 | Discharge: 2022-12-15 | Disposition: A | Discharge status: Home / Self Care | Payer: Medicare HMO | Source: Ambulatory Visit | Attending: Urology | Admitting: Urology

## 2022-12-15 NOTE — Patient Instructions (Addendum)
Your procedure is scheduled on: 12/21/22 - Tuesday Report to the Registration Desk on the 1st floor of the [facility]. To find out your arrival time, please call [phone] between 1PM - 3PM on: 12/20/22 - Monday If your arrival time is 6:00 am, do not arrive before that time as the [facility] entrance doors do not open until 6:00 am.  REMEMBER: Instructions that are not followed completely may result in serious medical risk, up to and including death; or upon the discretion of your surgeon and anesthesiologist your surgery may need to be rescheduled.  Do not eat food or drink any liquids  after midnight the night before surgery.  No gum chewing or hard candies.  One week prior to surgery: Stop taking beginning 12/15/22,  Anti-inflammatories (NSAIDS) such as Advil, Aleve, Ibuprofen, Motrin, Naproxen, Naprosyn and Aspirin based products such as Excedrin, Goody's Powder, BC Powder.  Stop ANY OVER THE COUNTER supplements until after surgery beginning 12/15/22.  You may however, continue to take Tylenol if needed for pain up until the day of surgery.  Continue taking all prescribed medications with the exception of the following:  - apixaban (ELIQUIS) hold beginning 12/18/22.  - insulin degludec (TRESIBA FLEXTOUCH) inject 1/2 prescribed dose of insulin on the night before your surgery.  - Hold insulin aspart (NOVOLOG) on the morning of surgery, may resume with meals.  TAKE ONLY THESE MEDICATIONS THE MORNING OF SURGERY WITH A SIP OF WATER:  - pantoprazole (PROTONIX) - (take one the night before and one on the morning of surgery - helps to prevent nausea after surgery.) - allopurinol (ZYLOPRIM) - finasteride (PROSCAR) - BREO ELLIPTA - metoprolol tartrate (LOPRESSOR) - Treprostinil (TYVASO)  Use inhalers on the day of surgery and bring to the hospital.   No Alcohol for 24 hours before or after surgery.  No Smoking including e-cigarettes for 24 hours before surgery.  No  chewable tobacco products for at least 6 hours before surgery.  No nicotine patches on the day of surgery.  Do not use any "recreational" drugs for at least a week (preferably 2 weeks) before your surgery.  Please be advised that the combination of cocaine and anesthesia may have negative outcomes, up to and including death. If you test positive for cocaine, your surgery will be cancelled.  On the morning of surgery brush your teeth with toothpaste and water, you may rinse your mouth with mouthwash if you wish. Do not swallow any toothpaste or mouthwash.  Do not wear jewelry, make-up, hairpins, clips or nail polish.  Do not wear lotions, powders, or perfumes.   Do not shave body hair from the neck down 48 hours before surgery.  Contact lenses, hearing aids and dentures may not be worn into surgery.  Do not bring valuables to the hospital. [facility] is not responsible for any missing/lost belongings or valuables.   Notify your doctor if there is any change in your medical condition (cold, fever, infection).  Wear comfortable clothing (specific to your surgery type) to the hospital.  After surgery, you can help prevent lung complications by doing breathing exercises.  Take deep breaths and cough every 1-2 hours. Your doctor may order a device called an Incentive Spirometer to help you take deep breaths. When coughing or sneezing, hold a pillow firmly against your incision with both hands. This is called "splinting." Doing this helps protect your incision. It also decreases belly discomfort.  If you are being admitted to the hospital overnight, leave your suitcase  in the car. After surgery it may be brought to your room.  In case of increased patient census, it may be necessary for you, the patient, to continue your postoperative care in the Same Day Surgery department.  If you are being discharged the day of surgery, you will not be allowed to drive home. You will need a responsible  individual to drive you home and stay with you for 24 hours after surgery.   If you are taking public transportation, you will need to have a responsible individual with you.  Please call the Pre-admissions Testing Dept. at [phone] if you have any questions about these instructions.  Surgery Visitation Policy:  Patients having surgery or a procedure may have two visitors.  Children under the age of 29 must have an adult with them who is not the patient.  Inpatient Visitation:    Visiting hours are 7 a.m. to 8 p.m. Up to four visitors are allowed at one time in a patient room. The visitors may rotate out with other people during the day.  One visitor age 36 or older may stay with the patient overnight and must be in the room by 8 p.m.

## 2022-12-15 NOTE — Pre-Procedure Instructions (Signed)
Follow call to review allergies, medications and hx, patients surgery rescheduled for 12/21/22, will come in for labs and EKG on 12/16/22.

## 2022-12-16 ENCOUNTER — Encounter
Admission: RE | Admit: 2022-12-16 | Discharge: 2022-12-16 | Disposition: A | Discharge status: Home / Self Care | Payer: Medicare HMO | Source: Ambulatory Visit | Attending: Urology | Admitting: Urology

## 2022-12-16 DIAGNOSIS — Z01818 Encounter for other preprocedural examination: Secondary | ICD-10-CM | POA: Insufficient documentation

## 2022-12-16 DIAGNOSIS — Z01812 Encounter for preprocedural laboratory examination: Secondary | ICD-10-CM

## 2022-12-16 DIAGNOSIS — I498 Other specified cardiac arrhythmias: Secondary | ICD-10-CM | POA: Insufficient documentation

## 2022-12-16 DIAGNOSIS — I482 Chronic atrial fibrillation, unspecified: Secondary | ICD-10-CM | POA: Diagnosis not present

## 2022-12-16 LAB — CBC
HCT: 32 % — ABNORMAL LOW (ref 39.0–52.0)
Hemoglobin: 10.3 g/dL — ABNORMAL LOW (ref 13.0–17.0)
MCH: 32.6 pg (ref 26.0–34.0)
MCHC: 32.2 g/dL (ref 30.0–36.0)
MCV: 101.3 fL — ABNORMAL HIGH (ref 80.0–100.0)
Platelets: 255 10*3/uL (ref 150–400)
RBC: 3.16 MIL/uL — ABNORMAL LOW (ref 4.22–5.81)
RDW: 15.3 % (ref 11.5–15.5)
WBC: 9.1 10*3/uL (ref 4.0–10.5)
nRBC: 0 % (ref 0.0–0.2)

## 2022-12-16 LAB — BASIC METABOLIC PANEL
Anion gap: 8 (ref 5–15)
BUN: 36 mg/dL — ABNORMAL HIGH (ref 8–23)
CO2: 27 mmol/L (ref 22–32)
Calcium: 9.3 mg/dL (ref 8.9–10.3)
Chloride: 107 mmol/L (ref 98–111)
Creatinine, Ser: 2.43 mg/dL — ABNORMAL HIGH (ref 0.61–1.24)
GFR, Estimated: 26 mL/min — ABNORMAL LOW (ref >60)
Glucose, Bld: 72 mg/dL (ref 70–99)
Potassium: 4 mmol/L (ref 3.5–5.1)
Sodium: 142 mmol/L (ref 135–145)

## 2022-12-20 MED ORDER — ORAL CARE MOUTH RINSE: 15.0000 mL | Freq: Once | OROMUCOSAL | Status: AC

## 2022-12-20 MED ORDER — CEFAZOLIN SODIUM-DEXTROSE 2-4 GM/100ML-% IV SOLN
2.0000 g | INTRAVENOUS | Status: AC
  Administered 2022-12-21: 2 g via INTRAVENOUS

## 2022-12-20 MED ORDER — CHLORHEXIDINE GLUCONATE 0.12 % MT SOLN
15.0000 mL | Freq: Once | OROMUCOSAL | Status: AC
  Administered 2022-12-21: 15 mL via OROMUCOSAL

## 2022-12-20 MED ORDER — LACTATED RINGERS IV SOLN: INTRAVENOUS | Status: DC

## 2022-12-21 ENCOUNTER — Other Ambulatory Visit: Payer: Self-pay

## 2022-12-21 ENCOUNTER — Ambulatory Visit: Payer: Medicare HMO | Admitting: Certified Registered Nurse Anesthetist

## 2022-12-21 ENCOUNTER — Ambulatory Visit
Admission: RE | Admit: 2022-12-21 | Discharge: 2022-12-21 | Disposition: A | Discharge status: Home / Self Care | Payer: Medicare HMO | Source: Ambulatory Visit | Attending: Urology | Admitting: Urology

## 2022-12-21 ENCOUNTER — Encounter: Payer: Self-pay | Admitting: Urology

## 2022-12-21 ENCOUNTER — Ambulatory Visit: Payer: Medicare HMO | Admitting: Urgent Care

## 2022-12-21 ENCOUNTER — Encounter
Admission: RE | Disposition: A | Discharge status: Home / Self Care | Payer: Self-pay | Source: Ambulatory Visit | Attending: Urology

## 2022-12-21 DIAGNOSIS — N476 Balanoposthitis: Secondary | ICD-10-CM | POA: Insufficient documentation

## 2022-12-21 DIAGNOSIS — I130 Hypertensive heart and chronic kidney disease with heart failure and stage 1 through stage 4 chronic kidney disease, or unspecified chronic kidney disease: Secondary | ICD-10-CM | POA: Diagnosis not present

## 2022-12-21 DIAGNOSIS — I5032 Chronic diastolic (congestive) heart failure: Secondary | ICD-10-CM | POA: Diagnosis not present

## 2022-12-21 DIAGNOSIS — E1122 Type 2 diabetes mellitus with diabetic chronic kidney disease: Secondary | ICD-10-CM | POA: Diagnosis not present

## 2022-12-21 DIAGNOSIS — Z87891 Personal history of nicotine dependence: Secondary | ICD-10-CM | POA: Diagnosis not present

## 2022-12-21 DIAGNOSIS — N471 Phimosis: Secondary | ICD-10-CM | POA: Insufficient documentation

## 2022-12-21 DIAGNOSIS — N183 Chronic kidney disease, stage 3 unspecified: Secondary | ICD-10-CM | POA: Diagnosis not present

## 2022-12-21 DIAGNOSIS — Z8249 Family history of ischemic heart disease and other diseases of the circulatory system: Secondary | ICD-10-CM | POA: Insufficient documentation

## 2022-12-21 HISTORY — PX: DORSAL SLIT: SHX6822

## 2022-12-21 LAB — GLUCOSE, CAPILLARY
Glucose-Capillary: 167 mg/dL — ABNORMAL HIGH (ref 70–99)
Glucose-Capillary: 143 mg/dL — ABNORMAL HIGH (ref 70–99)

## 2022-12-21 SURGERY — DORSAL SLIT, PREPUCE
Anesthesia: Monitor Anesthesia Care | Site: Penis

## 2022-12-21 MED ORDER — PROPOFOL 500 MG/50ML IV EMUL
INTRAVENOUS | Status: DC | PRN
  Administered 2022-12-21: 50 ug/kg/min via INTRAVENOUS

## 2022-12-21 MED ORDER — EPHEDRINE SULFATE (PRESSORS) 50 MG/ML IJ SOLN
INTRAMUSCULAR | Status: DC | PRN
  Administered 2022-12-21 (×2): 5 mg via INTRAVENOUS

## 2022-12-21 MED ORDER — BACITRACIN ZINC 500 UNIT/GM EX OINT
TOPICAL_OINTMENT | CUTANEOUS | Status: AC
  Filled 2022-12-21: qty 28.35

## 2022-12-21 MED ORDER — FENTANYL CITRATE (PF) 100 MCG/2ML IJ SOLN
INTRAMUSCULAR | Status: AC
  Filled 2022-12-21: qty 2

## 2022-12-21 MED ORDER — CHLORHEXIDINE GLUCONATE 0.12 % MT SOLN
OROMUCOSAL | Status: AC
  Filled 2022-12-21: qty 15

## 2022-12-21 MED ORDER — ONDANSETRON HCL 4 MG/2ML IJ SOLN
INTRAMUSCULAR | Status: DC | PRN
  Administered 2022-12-21: 4 mg via INTRAVENOUS

## 2022-12-21 MED ORDER — HYDROCODONE-ACETAMINOPHEN 5-325 MG PO TABS: 1.0000 | ORAL_TABLET | Freq: Four times a day (QID) | ORAL | 0 refills | Status: DC | PRN

## 2022-12-21 MED ORDER — BUPIVACAINE HCL (PF) 0.25 % IJ SOLN
INTRAMUSCULAR | Status: AC
  Filled 2022-12-21: qty 30

## 2022-12-21 MED ORDER — CEFAZOLIN SODIUM-DEXTROSE 2-4 GM/100ML-% IV SOLN
INTRAVENOUS | Status: AC
  Filled 2022-12-21: qty 100

## 2022-12-21 MED ORDER — 0.9 % SODIUM CHLORIDE (POUR BTL) OPTIME
TOPICAL | Status: DC | PRN
  Administered 2022-12-21: 500 mL

## 2022-12-21 MED ORDER — BUPIVACAINE HCL 0.25 % IJ SOLN
INTRAMUSCULAR | Status: DC | PRN
  Administered 2022-12-21: 6 mL

## 2022-12-21 MED ORDER — DEXMEDETOMIDINE HCL IN NACL 80 MCG/20ML IV SOLN
INTRAVENOUS | Status: DC | PRN
  Administered 2022-12-21: 4 ug via INTRAVENOUS

## 2022-12-21 MED ORDER — FENTANYL CITRATE (PF) 100 MCG/2ML IJ SOLN
INTRAMUSCULAR | Status: DC | PRN
  Administered 2022-12-21 (×4): 25 ug via INTRAVENOUS

## 2022-12-21 MED ORDER — LIDOCAINE HCL (CARDIAC) PF 100 MG/5ML IV SOSY
PREFILLED_SYRINGE | INTRAVENOUS | Status: DC | PRN
  Administered 2022-12-21: 80 mg via INTRAVENOUS

## 2022-12-21 MED ORDER — ACETAMINOPHEN 10 MG/ML IV SOLN: 1000.0000 mg | Freq: Once | INTRAVENOUS | Status: DC | PRN

## 2022-12-21 MED ORDER — PROPOFOL 1000 MG/100ML IV EMUL
INTRAVENOUS | Status: AC
  Filled 2022-12-21: qty 100

## 2022-12-21 SURGICAL SUPPLY — 34 items
APL PRP STRL LF DISP 70% ISPRP (MISCELLANEOUS) ×1
BLADE CLIPPER SURG (BLADE) ×1 IMPLANT
BLADE SURG 15 STRL LF DISP TIS (BLADE) ×1 IMPLANT
BLADE SURG 15 STRL SS (BLADE) ×1
BNDG CMPR 5X1 CHSV STRCH NS (GAUZE/BANDAGES/DRESSINGS)
BNDG CMPR 75X21 PLY HI ABS (MISCELLANEOUS) ×2
BNDG COHESIVE 1X5 TAN NS LF (GAUZE/BANDAGES/DRESSINGS) IMPLANT
CHLORAPREP W/TINT 26 (MISCELLANEOUS) ×1 IMPLANT
DRAPE LAPAROTOMY 77X122 PED (DRAPES) ×1 IMPLANT
ELECT REM PT RETURN 9FT ADLT (ELECTROSURGICAL) ×1
ELECTRODE REM PT RTRN 9FT ADLT (ELECTROSURGICAL) ×1 IMPLANT
GAUZE 4X4 16PLY ~~LOC~~+RFID DBL (SPONGE) ×1 IMPLANT
GAUZE PETROLATUM 1 X8 (GAUZE/BANDAGES/DRESSINGS) ×1 IMPLANT
GAUZE STRETCH 2X75IN STRL (MISCELLANEOUS) ×1 IMPLANT
GLOVE BIOGEL PI IND STRL 7.5 (GLOVE) ×1 IMPLANT
GOWN STRL REUS W/ TWL LRG LVL3 (GOWN DISPOSABLE) ×1 IMPLANT
GOWN STRL REUS W/ TWL XL LVL3 (GOWN DISPOSABLE) ×1 IMPLANT
GOWN STRL REUS W/TWL LRG LVL3 (GOWN DISPOSABLE) ×1
GOWN STRL REUS W/TWL XL LVL3 (GOWN DISPOSABLE) ×1
KIT TURNOVER KIT A (KITS) ×1 IMPLANT
LABEL OR SOLS (LABEL) ×1 IMPLANT
MANIFOLD NEPTUNE II (INSTRUMENTS) ×1 IMPLANT
NDL HYPO 25X1 1.5 SAFETY (NEEDLE) ×1 IMPLANT
NEEDLE HYPO 25X1 1.5 SAFETY (NEEDLE) ×1 IMPLANT
NS IRRIG 500ML POUR BTL (IV SOLUTION) ×1 IMPLANT
PACK BASIN MINOR ARMC (MISCELLANEOUS) ×1 IMPLANT
SOL PREP PVP 2OZ (MISCELLANEOUS) ×1
SOLUTION PREP PVP 2OZ (MISCELLANEOUS) ×1 IMPLANT
SUT CHROMIC 3 0 SH 27 (SUTURE) ×1 IMPLANT
SUT CHROMIC 4 0 RB 1X27 (SUTURE) ×1 IMPLANT
SUT CHROMIC 4 0 SH 27 (SUTURE) IMPLANT
SYR 10ML LL (SYRINGE) ×1 IMPLANT
TRAP FLUID SMOKE EVACUATOR (MISCELLANEOUS) ×1 IMPLANT
WATER STERILE IRR 500ML POUR (IV SOLUTION) ×1 IMPLANT

## 2022-12-21 NOTE — Op Note (Signed)
   Preoperative diagnosis:  Recurrent balanoposthitis Phimosis  Postoperative diagnosis:  Same  Procedure: Dorsal slit  Surgeon: Riki Altes, MD  Anesthesia: MAC/dorsal penile block  Complications: None  Intraoperative findings:  Tight but retractable foreskin   EBL: Minimal  Specimens: None  Indication: Jay Morris is a 77 y.o. male with recurrent balanoposthitis and difficulty retracting foreskin.  After reviewing the management options for treatment, he elected to proceed with the above surgical procedure(s). We have discussed the potential benefits and risks of the procedure, side effects of the proposed treatment, the likelihood of the patient achieving the goals of the procedure, and any potential problems that might occur during the procedure or recuperation. Informed consent has been obtained.  Description of procedure:  The patient was taken to the operating room and transferred to the the OR table.  Sedation was obtained by anesthesia.  The patient was placed in the supine position, prepped and draped in the usual sterile fashion, and preoperative antibiotics were administered. A preoperative time-out was performed.   A dorsal penile block was performed with 8 cc of plain 0.25% Sensorcaine  The dorsal prepuce was clamped with a Kocher clamp and then incised with scissors just proximal to the corona.  A 3-0 chromic suture was placed at the apex of the incision and tacked.  Hemostasis was obtained with cautery and the skin edges on each side were reapproximated with a running 3-0 chromic suture.  A dressing of Vaseline gauze, Kling and StretchNet was applied.  The patient was transported to the PACU in stable condition  Plan: Postop follow-up scheduled 01/02/2023   Riki Altes, M.D.

## 2022-12-21 NOTE — Anesthesia Preprocedure Evaluation (Addendum)
Anesthesia Evaluation  Patient identified by MRN, date of birth, ID band Patient awake    Reviewed: Allergy & Precautions, H&P , NPO status , Patient's Chart, lab work & pertinent test results  History of Anesthesia Complications Negative for: history of anesthetic complications  Airway Mallampati: II  TM Distance: >3 FB Neck ROM: full    Dental  (+) Poor Dentition, Dental Advidsory Given, Upper Dentures, Chipped   Pulmonary with exertion and Long-Term Oxygen Therapy, asthma , COPD,  COPD inhaler and oxygen dependent, neg recent URI, former smoker 3L/min oxygen continuously. Took inhalers today. Breathing feels at baseline  3L [place], no respiratory distress  - rhonchi (-) wheezing      Cardiovascular Exercise Tolerance: Poor hypertension, Pt. on medications (-) angina + CAD, + Peripheral Vascular Disease and +CHF  (-) Past MI, (-) Cardiac Stents and (-) CABG + dysrhythmias (s/p afib ablation)  Rhythm:Regular Rate:Normal - Systolic murmurs and - Diastolic murmurs Severe pulm HTN, takes inhaled prostaglandin. ] Cath 2021: Assessment Patient with severe shortness of breath with severe pulmonary hypertension possibly secondary to Covid infection and pulmonary injury and normal LV systolic function with moderate three-vessel coronary atherosclerosis  Plan Aggressive medical management of risk factor modification for further risk reduction of progression of coronary atherosclerosis including hypertension control, high intensity cholesterol therapy, and antiplatelet therapy Further treatment of severe pulmonary hypertension as per pulmonology No further cardiac intervention at this time Further consideration of stress test evaluation if any concerns of possible myocardial ischemia in the future    Neuro/Psych negative neurological ROS  negative psych ROS   GI/Hepatic negative GI ROS, Neg liver ROS,  Controlled,,GIB   Endo/Other   diabetes, Insulin Dependent    Renal/GU CRF and Renal InsufficiencyRenal disease  negative genitourinary   Musculoskeletal  (+) Arthritis ,    Abdominal   Peds  Hematology  (+) Blood dyscrasia, anemia   Anesthesia Other Findings Phimosis  Pre-anesthesia testing consult reviewed with cardiology clearance reviewed.   Reproductive/Obstetrics negative OB ROS                             Anesthesia Physical Anesthesia Plan  ASA: 4  Anesthesia Plan: MAC   Post-op Pain Management: Regional block*   Induction: Intravenous  PONV Risk Score and Plan: 2 and Propofol infusion and TIVA  Airway Management Planned: Natural Airway and Nasal Cannula  Additional Equipment: None  Intra-op Plan:   Post-operative Plan:   Informed Consent: I have reviewed the patients History and Physical, chart, labs and discussed the procedure including the risks, benefits and alternatives for the proposed anesthesia with the patient or authorized representative who has indicated his/her understanding and acceptance.     Dental Advisory Given  Plan Discussed with: CRNA and Surgeon  Anesthesia Plan Comments:         Anesthesia Quick Evaluation

## 2022-12-21 NOTE — Interval H&P Note (Signed)
History and Physical Interval Note:  12/21/2022 1:36 PM  Jay Morris  has presented today for surgery, with the diagnosis of Phimosis.  The various methods of treatment have been discussed with the patient and family. After consideration of risks, benefits and other options for treatment, the patient has consented to  Procedure(s): DORSAL SLIT (N/A) as a surgical intervention.  The patient's history has been reviewed, patient examined, no change in status, stable for surgery.  I have reviewed the patient's chart and labs.  Questions were answered to the patient's satisfaction.     Franziska Podgurski C Darlene Bartelt

## 2022-12-21 NOTE — Transfer of Care (Signed)
Immediate Anesthesia Transfer of Care Note  Patient: Jiles Jacox  Procedure(s) Performed: DORSAL SLIT (Penis)  Patient Location: PACU  Anesthesia Type:General  Level of Consciousness: awake, alert , and oriented  Airway & Oxygen Therapy: Patient Spontanous Breathing and Patient connected to face mask oxygen  Post-op Assessment: Report given to RN and Post -op Vital signs reviewed and stable  Post vital signs: Reviewed and stable  Last Vitals:  Vitals Value Taken Time  BP 107/59   Temp    Pulse 59 12/21/22 1431  Resp 14 12/21/22 1431  SpO2 99 % 12/21/22 1431  Vitals shown include unvalidated device data.  Last Pain:  Vitals:   12/21/22 1148  TempSrc: Temporal  PainSc: 3          Complications: No notable events documented.

## 2022-12-21 NOTE — H&P (Signed)
Urology H&P   History of Present Illness: Jay Morris is a 77 y.o. male with recurrent balanoposthitis secondary to phimosis.  After discussing options he presents for dorsal slit  Past Medical History:  Diagnosis Date   (HFpEF) heart failure with preserved ejection fraction (HCC)    a.) TTE 04/01/2016: EF 60-65%, LAE, MAC, mild MR/AR/TR, mild-mod PR, G2DD; b.) TTE 12/18/2018: EF 55%, mild LVH, mild AR/MR/TR. mod PR; c.) TTE 05/31/2019: EF 60-65%, mild LVH, RAE/RVE, triv AR.MT, PR, mild TR; d.) TTE 10/26/2019: EF 55%, BAE, RVE, triv AR, mild MR/PR, sev TR, G2DD; e.) R/LHC 01/17/2020: mPA 52, PCWP 25, LVEDP 19, RVEDP 25, mRA 13   Anemia    Aortic atherosclerosis (HCC)    Asthma    BPH (benign prostatic hyperplasia)    CKD (chronic kidney disease), stage III (HCC)    COPD (chronic obstructive pulmonary disease) (HCC)    Coronary artery disease 01/17/2020   a.) LHC 01/17/2020: 25% oLAD, 65% o-pRCA, 45% pLAD, 65% mLAD, 35% OM2 --> med mgmt.   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   PAF (paroxysmal atrial fibrillation) (HCC)    a.) CHA2DS2-VASc = 6 (age x 2, HFpEF, HTN, vascular disease, T2DM);  b.) cardiac ablation in approx 2014-2015 at [facility]; c.) A.fib recurrence in setting of SARS-CoV-2 --> Tx'd with IV amiodarone + metoprolol + DCCV x 1 (200 J) 06/22/2019 restoring NSR; d.) rate/rhythm maintained without pharmacological intervention; chronically anticoagulated using standard dose apixaban   Peripheral vascular disease (HCC)    Phimosis of penis    Pneumonia due to COVID-19  virus 06/02/2019   a.) hospitalized at [facility]) from 06/02/2019 - 06/25/2019; Tx'd with steriods + remdesivir + convalescent plasma   Pneumonia due to COVID-19 virus 05/27/2022   a.) hospitalized at [facility] from 05/27/2022 - 05/30/2022 for acute on chronic respiratory failure secondary to SARS-CoV-2 PNA ; Tx'd with IV steroids + remdesivir   Pulmonary fibrosis (HCC)    Pulmonary HTN (HCC) 04/01/2016   a.) TTE 04/01/2016: EF 60-65%, RVSP 40; b.) TTE 12/18/2018: EF 55%, RVSP 45.9; c.) TTE 05/31/2019: EF 60-65%, RVSP 35.5; d.) TTE 10/26/2019: EF 55%, RVSP 100.4; e.) R/LHC 01/17/2020: mPA 52, PCWP 25, LVEDP 19, RVEDP 25, mRA 13; f.) Tx with PDE5i (sildenafil) + vasodilator (treprostinil)   Type 2 diabetes mellitus treated with insulin (HCC)     Past Surgical History:  Procedure Laterality Date   BACK SURGERY     x3 L3-L4    CARDIAC ELECTROPHYSIOLOGY MAPPING AND ABLATION N/A    performed at [facility] in approximately 2014-2015   CARDIOVERSION N/A 06/22/2019   Procedure: CARDIOVERSION;  Surgeon: Pricilla Riffle, MD;  Location: [facility] ENDOSCOPY;  Service: Cardiovascular;  Laterality: N/A;   COLONOSCOPY WITH PROPOFOL N/A 06/15/2017   Procedure: COLONOSCOPY WITH PROPOFOL;  Surgeon: Toledo, Boykin Nearing, MD;  Location: [facility] ENDOSCOPY;  Service: Gastroenterology;  Laterality: N/A;   ENTEROSCOPY N/A 11/24/2017   Procedure: ENTEROSCOPY;  Surgeon: Wyline Mood, MD;  Location: [facility] ENDOSCOPY;  Service: Gastroenterology;  Laterality: N/A;   ESOPHAGOGASTRODUODENOSCOPY N/A 11/27/2017   Procedure: ESOPHAGOGASTRODUODENOSCOPY (EGD);  Surgeon: Midge Minium, MD;  Location: [facility] ENDOSCOPY;  Service: Endoscopy;  Laterality: N/A;   ESOPHAGOGASTRODUODENOSCOPY (EGD) WITH PROPOFOL N/A 06/02/2017   Procedure: ESOPHAGOGASTRODUODENOSCOPY (EGD) WITH PROPOFOL;  Surgeon: Toledo, Boykin Nearing, MD;  Location: [facility] ENDOSCOPY;  Service: Gastroenterology;  Laterality: N/A;   ESOPHAGOGASTRODUODENOSCOPY  (EGD) WITH PROPOFOL N/A 08/04/2021   Procedure: ESOPHAGOGASTRODUODENOSCOPY (EGD) WITH PROPOFOL;  Surgeon: Regis Bill, MD;  Location: [facility] ENDOSCOPY;  Service: Endoscopy;  Laterality: N/A;   EVALUATION UNDER ANESTHESIA WITH HEMORRHOIDECTOMY N/A 02/12/2022   Procedure: EXAM UNDER ANESTHESIA WITH HEMORRHOIDECTOMY;  Surgeon: Sung Amabile, DO;  Location: [facility] ORS;  Service: General;  Laterality: N/A;   GIVENS CAPSULE STUDY N/A 11/25/2017   Procedure: GIVENS CAPSULE STUDY;  Surgeon: Wyline Mood, MD;  Location: [facility] ENDOSCOPY;  Service: Gastroenterology;  Laterality: N/A;   HAND SURGERY     RIGHT/LEFT HEART CATH AND CORONARY ANGIOGRAPHY N/A 01/17/2020   Procedure: RIGHT/LEFT HEART CATH AND CORONARY ANGIOGRAPHY;  Surgeon: Lamar Blinks, MD;  Location: [facility] INVASIVE CV LAB;  Service: Cardiovascular;  Laterality: N/A;    Home Medications:  Current Meds  Medication Sig   acetaminophen (TYLENOL) 500 MG tablet Take 500-1,000 mg by mouth every 6 (six) hours as needed for moderate pain or headache.   allopurinol (ZYLOPRIM) 100 MG tablet Take 200 mg by mouth daily.   finasteride (PROSCAR) 5 MG tablet TAKE 1 TABLET BY MOUTH DAILY   fluticasone furoate-vilanterol (BREO ELLIPTA) 100-25 MCG/ACT AEPB Inhale 1 puff into the lungs daily.   furosemide (LASIX) 20 MG tablet Take 20 mg by mouth daily.   insulin aspart (NOVOLOG) 100 UNIT/ML FlexPen Inject 35-45 Units into the skin 3 (three) times daily with meals. (Based upon blood glucose reading)   insulin degludec (TRESIBA FLEXTOUCH) 200 UNIT/ML FlexTouch Pen Inject 80 Units into the skin at bedtime.   lidocaine (XYLOCAINE) 5 % ointment Apply 1 Application topically 3 (three) times daily as needed.   lisinopril (ZESTRIL) 5 MG tablet Take 5 mg by mouth daily.   metoprolol tartrate (LOPRESSOR) 25 MG tablet Take 2 tablets (50 mg total) by mouth 2 (two) times daily.   pantoprazole (PROTONIX) 40 MG tablet Take 1 tablet (40 mg total) by mouth 2 (two) times  daily before a meal.   sildenafil (REVATIO) 20 MG tablet Take 20 mg by mouth 3 (three) times daily.   tamsulosin (FLOMAX) 0.4 MG CAPS capsule Take 0.4 mg by mouth daily after supper.   Treprostinil (TYVASO) 0.6 MG/ML SOLN Inhale 18 mcg into the lungs daily.    Allergies:  Allergies  Allergen Reactions   Shrimp [Shellfish Allergy] Anaphylaxis   Shrimp Extract     Family History  Problem Relation Age of Onset   Heart disease Mother    Cancer Father    Cancer Paternal Grandfather     Social History:  reports that he quit smoking about 29 years ago. His smoking use included cigarettes. He has a 30.00 pack-year smoking history. He has never used smokeless tobacco. He reports that he does not drink alcohol and does not use drugs.  ROS: Noncontributory septa as per the HPI  Physical Exam:  Vital signs in last 24 hours: Temp:  [98 F (36.7 C)] 98 F (36.7 C) (06/04 1148) Pulse Rate:  [57] 57 (06/04 1148) BP: (163)/(68) 163/68 (06/04 1148) SpO2:  [97 %] 97 % (06/04 1148) Weight:  [86.6 kg] 86.6 kg (06/04 1148) Constitutional:  Alert and oriented, No acute distress HEENT: [place] AT, moist mucus membranes.  Trachea midline, no masses Cardiovascular: Regular rate and rhythm Respiratory: Normal respiratory effort, lungs clear bilaterally GI: Abdomen is soft, nontender, nondistended, no abdominal masses GU: Redundant prepuce with phimosis Psychiatric: Normal mood and affect    Impression/Plan:   1.  Phimosis Presents for dorsal slit and after discussing options    12/21/2022, 1:33 PM  Irineo Axon,  MD

## 2022-12-21 NOTE — Discharge Instructions (Addendum)
You may shower in 48 hours.  No tub bath for 7 days Remove dressing in 24 hours A prescription for pain medication was sent to your pharmacy Sutures will dissolve Call for redness, drainage or fever greater than 101 degrees Follow-up appointment scheduled 01/05/2023 You may resume Eliquis 12/22/2022    AMBULATORY SURGERY  DISCHARGE INSTRUCTIONS   The drugs that you were given will stay in your system until tomorrow so for the next 24 hours you should not:  Drive an automobile Make any legal decisions Drink any alcoholic beverage   You may resume regular meals tomorrow.  Today it is better to start with liquids and gradually work up to solid foods.  You may eat anything you prefer, but it is better to start with liquids, then soup and crackers, and gradually work up to solid foods.   Please notify your doctor immediately if you have any unusual bleeding, trouble breathing, redness and pain at the surgery site, drainage, fever, or pain not relieved by medication.    Additional Instructions:        Please contact your physician with any problems or Same Day Surgery at [phone], Monday through Friday 6 am to 4 pm, or [place] at [facility] number at [phone].

## 2022-12-22 ENCOUNTER — Encounter: Payer: Self-pay | Admitting: Urology

## 2022-12-22 NOTE — Anesthesia Postprocedure Evaluation (Signed)
Anesthesia Post Note  Patient: Jay Morris  Procedure(s) Performed: DORSAL SLIT (Penis)  Patient location during evaluation: PACU Anesthesia Type: MAC Level of consciousness: awake and alert Pain management: pain level controlled Vital Signs Assessment: post-procedure vital signs reviewed and stable Respiratory status: spontaneous breathing, nonlabored ventilation, respiratory function stable and patient connected to nasal cannula oxygen Cardiovascular status: blood pressure returned to baseline and stable Postop Assessment: no apparent nausea or vomiting Anesthetic complications: no   No notable events documented.   Last Vitals:  Vitals:   12/21/22 1500 12/21/22 1517  BP: 137/69 125/67  Pulse: (!) 55 68  Resp: 17 18  Temp:  (!) 35.8 C  SpO2: 100% 96%    Last Pain:  Vitals:   12/22/22 0832  TempSrc:   PainSc: 0-No pain                 Corinda Gubler

## 2023-01-05 ENCOUNTER — Encounter: Payer: Self-pay | Admitting: Urology

## 2023-01-05 ENCOUNTER — Ambulatory Visit (INDEPENDENT_AMBULATORY_CARE_PROVIDER_SITE_OTHER): Payer: Medicare HMO | Admitting: Urology

## 2023-01-05 VITALS — BP 116/60 | HR 61 | Ht 70.0 in | Wt 191.0 lb

## 2023-01-05 DIAGNOSIS — R338 Other retention of urine: Secondary | ICD-10-CM | POA: Diagnosis not present

## 2023-01-05 DIAGNOSIS — Z09 Encounter for follow-up examination after completed treatment for conditions other than malignant neoplasm: Secondary | ICD-10-CM

## 2023-01-05 DIAGNOSIS — N401 Enlarged prostate with lower urinary tract symptoms: Secondary | ICD-10-CM

## 2023-01-05 DIAGNOSIS — N471 Phimosis: Secondary | ICD-10-CM

## 2023-01-05 DIAGNOSIS — Z87448 Personal history of other diseases of urinary system: Secondary | ICD-10-CM | POA: Diagnosis not present

## 2023-01-05 LAB — URINALYSIS, COMPLETE
Bilirubin, UA: NEGATIVE
Glucose, UA: NEGATIVE
Ketones, UA: NEGATIVE
Nitrite, UA: NEGATIVE
Specific Gravity, UA: 1.015 (ref 1.005–1.030)
Urobilinogen, Ur: 0.2 mg/dL (ref 0.2–1.0)
pH, UA: 5.5 (ref 5.0–7.5)

## 2023-01-05 LAB — MICROSCOPIC EXAMINATION: WBC, UA: >30 /hpf — AB (ref 0–5)

## 2023-01-05 NOTE — Progress Notes (Signed)
I, Duke Salvia, acting as a Neurosurgeon for Riki Altes, MD., have documented all relevant documentation on the behalf of Riki Altes, MD, as directed by  Riki Altes, MD while in the presence of Riki Altes, MD.   01/05/2023 12:34 PM   Jay Morris 11-06-1945 [id number]  Referring provider: Marguarite Arbour, MD [address] [facility] [place],  [place] [address]  Chief Complaint  Patient presents with   Routine Post Op    Urologic history:  1.  Chronic urinary retention Failed several voiding trials Elected CIC; voids between caths   HPI: 77 y.o. male presents for post-op follow-up.  Status post dorsal slit 12/21/2022. His wife states he had some bleeding the first post-operative day, which resolved. When she catheterized him this morning, she noted his urine was cloudy and this he may have a UTI.   PMH: Past Medical History:  Diagnosis Date   (HFpEF) heart failure with preserved ejection fraction (HCC)    a.) TTE 04/01/2016: EF 60-65%, LAE, MAC, mild MR/AR/TR, mild-mod PR, G2DD; b.) TTE 12/18/2018: EF 55%, mild LVH, mild AR/MR/TR. mod PR; c.) TTE 05/31/2019: EF 60-65%, mild LVH, RAE/RVE, triv AR.MT, PR, mild TR; d.) TTE 10/26/2019: EF 55%, BAE, RVE, triv AR, mild MR/PR, sev TR, G2DD; e.) R/LHC 01/17/2020: mPA 52, PCWP 25, LVEDP 19, RVEDP 25, mRA 13   Anemia    Aortic atherosclerosis (HCC)    Asthma    BPH (benign prostatic hyperplasia)    CKD (chronic kidney disease), stage III (HCC)    COPD (chronic obstructive pulmonary disease) (HCC)    Coronary artery disease 01/17/2020   a.) LHC 01/17/2020: 25% oLAD, 65% o-pRCA, 45% pLAD, 65% mLAD, 35% OM2 --> med mgmt.   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   PAF (paroxysmal atrial fibrillation) (HCC)    a.) CHA2DS2-VASc = 6 (age x 2, HFpEF, HTN, vascular disease, T2DM);  b.) cardiac ablation in approx 2014-2015 at [facility]; c.) A.fib recurrence in setting of SARS-CoV-2 --> Tx'd with IV amiodarone + metoprolol + DCCV x 1 (200 J) 06/22/2019 restoring NSR; d.) rate/rhythm maintained without pharmacological intervention; chronically anticoagulated using standard dose apixaban   Peripheral vascular disease (HCC)    Phimosis of penis    Pneumonia due to COVID-19 virus 06/02/2019   a.) hospitalized at [facility]) from 06/02/2019 - 06/25/2019; Tx'd with steriods + remdesivir + convalescent plasma   Pneumonia due to COVID-19 virus 05/27/2022   a.) hospitalized at [facility] from 05/27/2022 - 05/30/2022 for acute on chronic respiratory failure secondary to SARS-CoV-2 PNA ; Tx'd with IV steroids + remdesivir   Pulmonary fibrosis (HCC)    Pulmonary HTN (HCC) 04/01/2016   a.) TTE 04/01/2016: EF 60-65%, RVSP 40; b.) TTE 12/18/2018: EF 55%, RVSP 45.9; c.) TTE 05/31/2019: EF 60-65%, RVSP 35.5; d.) TTE 10/26/2019: EF 55%, RVSP 100.4; e.) R/LHC 01/17/2020: mPA 52, PCWP 25, LVEDP 19, RVEDP 25, mRA 13; f.) Tx with PDE5i (sildenafil) +  vasodilator (treprostinil)   Type 2 diabetes mellitus treated with insulin [facility])     Surgical History: Past Surgical History:  Procedure Laterality Date   BACK SURGERY     x3 L3-L4    CARDIAC ELECTROPHYSIOLOGY MAPPING AND ABLATION N/A    performed at [facility] in approximately 2014-2015   CARDIOVERSION N/A 06/22/2019   Procedure: CARDIOVERSION;  Surgeon: Pricilla Riffle, MD;  Location: [facility] ENDOSCOPY;  Service: Cardiovascular;  Laterality: N/A;   COLONOSCOPY WITH PROPOFOL N/A 06/15/2017   Procedure: COLONOSCOPY WITH PROPOFOL;   Surgeon: Toledo, Boykin Nearing, MD;  Location: [facility] ENDOSCOPY;  Service: Gastroenterology;  Laterality: N/A;   DORSAL SLIT N/A 12/21/2022   Procedure: DORSAL SLIT;  Surgeon: Riki Altes, MD;  Location: [facility] ORS;  Service: Urology;  Laterality: N/A;   ENTEROSCOPY N/A 11/24/2017   Procedure: ENTEROSCOPY;  Surgeon: Wyline Mood, MD;  Location: [facility] ENDOSCOPY;  Service: Gastroenterology;  Laterality: N/A;   ESOPHAGOGASTRODUODENOSCOPY N/A 11/27/2017   Procedure: ESOPHAGOGASTRODUODENOSCOPY (EGD);  Surgeon: Midge Minium, MD;  Location: [facility] ENDOSCOPY;  Service: Endoscopy;  Laterality: N/A;   ESOPHAGOGASTRODUODENOSCOPY (EGD) WITH PROPOFOL N/A 06/02/2017   Procedure: ESOPHAGOGASTRODUODENOSCOPY (EGD) WITH PROPOFOL;  Surgeon: Toledo, Boykin Nearing, MD;  Location: [facility] ENDOSCOPY;  Service: Gastroenterology;  Laterality: N/A;   ESOPHAGOGASTRODUODENOSCOPY (EGD) WITH PROPOFOL N/A 08/04/2021   Procedure: ESOPHAGOGASTRODUODENOSCOPY (EGD) WITH PROPOFOL;  Surgeon: Regis Bill, MD;  Location: [facility] ENDOSCOPY;  Service: Endoscopy;  Laterality: N/A;   EVALUATION UNDER ANESTHESIA WITH HEMORRHOIDECTOMY N/A 02/12/2022   Procedure: EXAM UNDER ANESTHESIA WITH HEMORRHOIDECTOMY;  Surgeon: Sung Amabile, DO;  Location: [facility] ORS;  Service: General;  Laterality: N/A;   GIVENS CAPSULE STUDY N/A 11/25/2017   Procedure: GIVENS CAPSULE STUDY;  Surgeon: Wyline Mood, MD;  Location: [facility] ENDOSCOPY;  Service: Gastroenterology;  Laterality: N/A;   HAND SURGERY     RIGHT/LEFT HEART CATH AND CORONARY ANGIOGRAPHY N/A 01/17/2020   Procedure: RIGHT/LEFT HEART CATH AND CORONARY ANGIOGRAPHY;  Surgeon: Lamar Blinks, MD;  Location: [facility] INVASIVE CV LAB;  Service: Cardiovascular;  Laterality: N/A;    Home Medications:  Allergies as of 01/05/2023       Reactions   Shrimp [shellfish Allergy] Anaphylaxis   Shrimp Extract         Medication List        Accurate as of January 05, 2023 12:34 PM. If you have any questions, ask your nurse  or doctor.          STOP taking these medications    HYDROcodone-acetaminophen 5-325 MG tablet Commonly known as: NORCO/VICODIN Stopped by: Riki Altes, MD       TAKE these medications    acetaminophen 500 MG tablet Commonly known as: TYLENOL Take 500-1,000 mg by mouth every 6 (six) hours as needed for moderate pain or headache.   allopurinol 100 MG tablet Commonly known as: ZYLOPRIM Take 200 mg by mouth daily.   apixaban 5 MG Tabs tablet Commonly known as: ELIQUIS Take 5 mg by mouth daily.   ascorbic acid 500 MG tablet Commonly known as: VITAMIN C Take 500 mg by mouth daily.   atorvastatin 20 MG tablet Commonly known as: LIPITOR Take 20 mg by mouth at bedtime.   calcitRIOL 0.25 MCG capsule Commonly known as: ROCALTROL Take 0.25 mcg by mouth daily.   colchicine 0.6 MG tablet Take 0.6 mg by mouth daily.   cyanocobalamin 1000 MCG tablet Commonly known as: VITAMIN B12 Take 1,000 mcg by mouth daily.   Dialyvite Vitamin D 5000 125 MCG (5000  UT) capsule Generic drug: Cholecalciferol Take 5,000 Units by mouth daily.   finasteride 5 MG tablet Commonly known as: PROSCAR TAKE 1 TABLET BY MOUTH DAILY   fluticasone furoate-vilanterol 100-25 MCG/ACT Aepb Commonly known as: BREO ELLIPTA Inhale 1 puff into the lungs daily.   furosemide 20 MG tablet Commonly known as: LASIX Take 20 mg by mouth daily.   insulin aspart 100 UNIT/ML FlexPen Commonly known as: NOVOLOG Inject 35-45 Units into the skin 3 (three) times daily with meals. (Based upon blood glucose reading)   lidocaine 5 % ointment Commonly known as: XYLOCAINE Apply 1 Application topically 3 (three) times daily as needed.   lisinopril 5 MG tablet Commonly known as: ZESTRIL Take 5 mg by mouth daily.   magnesium oxide 400 MG tablet Commonly known as: MAG-OX Take 400 mg by mouth 3 (three) times daily.   metoprolol tartrate 25 MG tablet Commonly known as: LOPRESSOR Take 2 tablets (50 mg total)  by mouth 2 (two) times daily.   nystatin-triamcinolone ointment Commonly known as: MYCOLOG Apply 1 Application topically 2 (two) times daily. What changed:  when to take this reasons to take this   pantoprazole 40 MG tablet Commonly known as: PROTONIX Take 1 tablet (40 mg total) by mouth 2 (two) times daily before a meal.   sildenafil 20 MG tablet Commonly known as: REVATIO Take 20 mg by mouth 3 (three) times daily.   tamsulosin 0.4 MG Caps capsule Commonly known as: FLOMAX Take 0.4 mg by mouth daily after supper.   Treprostinil 0.6 MG/ML Soln Commonly known as: TYVASO Inhale 18 mcg into the lungs daily.   Evaristo Bury FlexTouch 200 UNIT/ML FlexTouch Pen Generic drug: insulin degludec Inject 80 Units into the skin at bedtime.        Allergies:  Allergies  Allergen Reactions   Shrimp [Shellfish Allergy] Anaphylaxis   Shrimp Extract     Family History: Family History  Problem Relation Age of Onset   Heart disease Mother    Cancer Father    Cancer Paternal Grandfather     Social History:  reports that he quit smoking about 29 years ago. His smoking use included cigarettes. He has a 30.00 pack-year smoking history. He has never used smokeless tobacco. He reports that he does not drink alcohol and does not use drugs.   Physical Exam: BP 116/60   Pulse 61   Ht 5\' 10"  (1.778 m)   Wt 191 lb (86.6 kg)   BMI 27.41 kg/m   Constitutional:  Alert, No acute distress. HEENT: [place] AT GU: Dorsal slit site healing nicely. No erythema or drainage.   Assessment & Plan:    1. Status post dorsal slit Sight healing well. Instructed to retract redundant prepuce tissue completely daily.  2. BPH with urinary retention Continue intermittent catheterization. Wife will bring in a catheterized specimen for UA culture. PA follow up in 6 months.  I have reviewed the above documentation for accuracy and completeness, and I agree with the above.   Riki Altes, MD  [facility] [address] [place], [place] [address] [phone]

## 2023-01-05 NOTE — Addendum Note (Signed)
Addended by: Levada Schilling on: 01/05/2023 04:08 PM   Modules accepted: Orders

## 2023-01-07 ENCOUNTER — Emergency Department: Payer: Medicare HMO

## 2023-01-07 ENCOUNTER — Inpatient Hospital Stay
Admission: EM | Admit: 2023-01-07 | Discharge: 2023-01-10 | DRG: 698 | Disposition: A | Discharge status: Home Health | Payer: Medicare HMO | Attending: Obstetrics and Gynecology | Admitting: Obstetrics and Gynecology

## 2023-01-07 DIAGNOSIS — E785 Hyperlipidemia, unspecified: Secondary | ICD-10-CM | POA: Diagnosis present

## 2023-01-07 DIAGNOSIS — Z8701 Personal history of pneumonia (recurrent): Secondary | ICD-10-CM

## 2023-01-07 DIAGNOSIS — Z7901 Long term (current) use of anticoagulants: Secondary | ICD-10-CM

## 2023-01-07 DIAGNOSIS — N184 Chronic kidney disease, stage 4 (severe): Secondary | ICD-10-CM | POA: Diagnosis present

## 2023-01-07 DIAGNOSIS — D631 Anemia in chronic kidney disease: Secondary | ICD-10-CM | POA: Diagnosis present

## 2023-01-07 DIAGNOSIS — M109 Gout, unspecified: Secondary | ICD-10-CM | POA: Diagnosis present

## 2023-01-07 DIAGNOSIS — Z8616 Personal history of COVID-19: Secondary | ICD-10-CM | POA: Diagnosis not present

## 2023-01-07 DIAGNOSIS — T83511A Infection and inflammatory reaction due to indwelling urethral catheter, initial encounter: Secondary | ICD-10-CM | POA: Diagnosis present

## 2023-01-07 DIAGNOSIS — Z809 Family history of malignant neoplasm, unspecified: Secondary | ICD-10-CM

## 2023-01-07 DIAGNOSIS — I4891 Unspecified atrial fibrillation: Secondary | ICD-10-CM | POA: Diagnosis present

## 2023-01-07 DIAGNOSIS — N401 Enlarged prostate with lower urinary tract symptoms: Secondary | ICD-10-CM | POA: Diagnosis present

## 2023-01-07 DIAGNOSIS — J449 Chronic obstructive pulmonary disease, unspecified: Secondary | ICD-10-CM

## 2023-01-07 DIAGNOSIS — I130 Hypertensive heart and chronic kidney disease with heart failure and stage 1 through stage 4 chronic kidney disease, or unspecified chronic kidney disease: Secondary | ICD-10-CM | POA: Diagnosis present

## 2023-01-07 DIAGNOSIS — N400 Enlarged prostate without lower urinary tract symptoms: Secondary | ICD-10-CM | POA: Diagnosis present

## 2023-01-07 DIAGNOSIS — N471 Phimosis: Secondary | ICD-10-CM

## 2023-01-07 DIAGNOSIS — J441 Chronic obstructive pulmonary disease with (acute) exacerbation: Secondary | ICD-10-CM | POA: Diagnosis present

## 2023-01-07 DIAGNOSIS — Z1152 Encounter for screening for COVID-19: Secondary | ICD-10-CM

## 2023-01-07 DIAGNOSIS — I272 Pulmonary hypertension, unspecified: Secondary | ICD-10-CM | POA: Diagnosis present

## 2023-01-07 DIAGNOSIS — Z8249 Family history of ischemic heart disease and other diseases of the circulatory system: Secondary | ICD-10-CM

## 2023-01-07 DIAGNOSIS — I2510 Atherosclerotic heart disease of native coronary artery without angina pectoris: Secondary | ICD-10-CM | POA: Diagnosis present

## 2023-01-07 DIAGNOSIS — N3000 Acute cystitis without hematuria: Secondary | ICD-10-CM | POA: Diagnosis present

## 2023-01-07 DIAGNOSIS — E1129 Type 2 diabetes mellitus with other diabetic kidney complication: Secondary | ICD-10-CM | POA: Diagnosis present

## 2023-01-07 DIAGNOSIS — I5032 Chronic diastolic (congestive) heart failure: Secondary | ICD-10-CM | POA: Diagnosis present

## 2023-01-07 DIAGNOSIS — Z794 Long term (current) use of insulin: Secondary | ICD-10-CM

## 2023-01-07 DIAGNOSIS — Y846 Urinary catheterization as the cause of abnormal reaction of the patient, or of later complication, without mention of misadventure at the time of the procedure: Secondary | ICD-10-CM | POA: Diagnosis present

## 2023-01-07 DIAGNOSIS — Z87891 Personal history of nicotine dependence: Secondary | ICD-10-CM | POA: Diagnosis not present

## 2023-01-07 DIAGNOSIS — E1122 Type 2 diabetes mellitus with diabetic chronic kidney disease: Secondary | ICD-10-CM | POA: Diagnosis present

## 2023-01-07 DIAGNOSIS — R338 Other retention of urine: Secondary | ICD-10-CM | POA: Diagnosis present

## 2023-01-07 DIAGNOSIS — E1151 Type 2 diabetes mellitus with diabetic peripheral angiopathy without gangrene: Secondary | ICD-10-CM | POA: Diagnosis present

## 2023-01-07 DIAGNOSIS — Z789 Other specified health status: Secondary | ICD-10-CM

## 2023-01-07 DIAGNOSIS — I480 Paroxysmal atrial fibrillation: Secondary | ICD-10-CM | POA: Diagnosis present

## 2023-01-07 DIAGNOSIS — Z79899 Other long term (current) drug therapy: Secondary | ICD-10-CM

## 2023-01-07 DIAGNOSIS — J9601 Acute respiratory failure with hypoxia: Secondary | ICD-10-CM

## 2023-01-07 DIAGNOSIS — D649 Anemia, unspecified: Secondary | ICD-10-CM | POA: Diagnosis present

## 2023-01-07 DIAGNOSIS — Z9981 Dependence on supplemental oxygen: Secondary | ICD-10-CM

## 2023-01-07 DIAGNOSIS — J8410 Pulmonary fibrosis, unspecified: Secondary | ICD-10-CM | POA: Diagnosis present

## 2023-01-07 DIAGNOSIS — Z91013 Allergy to seafood: Secondary | ICD-10-CM

## 2023-01-07 DIAGNOSIS — K219 Gastro-esophageal reflux disease without esophagitis: Secondary | ICD-10-CM | POA: Diagnosis present

## 2023-01-07 DIAGNOSIS — I10 Essential (primary) hypertension: Secondary | ICD-10-CM | POA: Diagnosis present

## 2023-01-07 DIAGNOSIS — N390 Urinary tract infection, site not specified: Secondary | ICD-10-CM | POA: Diagnosis not present

## 2023-01-07 DIAGNOSIS — J9621 Acute and chronic respiratory failure with hypoxia: Secondary | ICD-10-CM | POA: Diagnosis present

## 2023-01-07 DIAGNOSIS — Z7951 Long term (current) use of inhaled steroids: Secondary | ICD-10-CM

## 2023-01-07 LAB — CBC
HCT: 29.6 % — ABNORMAL LOW (ref 39.0–52.0)
Hemoglobin: 9.6 g/dL — ABNORMAL LOW (ref 13.0–17.0)
MCH: 32.5 pg (ref 26.0–34.0)
MCHC: 32.4 g/dL (ref 30.0–36.0)
MCV: 100.3 fL — ABNORMAL HIGH (ref 80.0–100.0)
Platelets: 226 10*3/uL (ref 150–400)
RBC: 2.95 MIL/uL — ABNORMAL LOW (ref 4.22–5.81)
RDW: 14.9 % (ref 11.5–15.5)
WBC: 10.9 10*3/uL — ABNORMAL HIGH (ref 4.0–10.5)
nRBC: 0 % (ref 0.0–0.2)

## 2023-01-07 LAB — BASIC METABOLIC PANEL
Anion gap: 10 (ref 5–15)
BUN: 36 mg/dL — ABNORMAL HIGH (ref 8–23)
CO2: 20 mmol/L — ABNORMAL LOW (ref 22–32)
Calcium: 8.1 mg/dL — ABNORMAL LOW (ref 8.9–10.3)
Chloride: 105 mmol/L (ref 98–111)
Creatinine, Ser: 2.61 mg/dL — ABNORMAL HIGH (ref 0.61–1.24)
GFR, Estimated: 24 mL/min — ABNORMAL LOW (ref >60)
Glucose, Bld: 143 mg/dL — ABNORMAL HIGH (ref 70–99)
Potassium: 3.7 mmol/L (ref 3.5–5.1)
Sodium: 135 mmol/L (ref 135–145)

## 2023-01-07 LAB — URINALYSIS, W/ REFLEX TO CULTURE (INFECTION SUSPECTED)
Bacteria, UA: NONE SEEN
Bilirubin Urine: NEGATIVE
Glucose, UA: NEGATIVE mg/dL
Ketones, ur: NEGATIVE mg/dL
Nitrite: NEGATIVE
Protein, ur: 100 mg/dL — AB
Specific Gravity, Urine: 1.01 (ref 1.005–1.030)
Squamous Epithelial / HPF: NONE SEEN /HPF (ref 0–5)
WBC, UA: >50 WBC/hpf (ref 0–5)
pH: 6 (ref 5.0–8.0)

## 2023-01-07 LAB — SARS CORONAVIRUS 2 BY RT PCR: SARS Coronavirus 2 by RT PCR: NEGATIVE

## 2023-01-07 LAB — TROPONIN I (HIGH SENSITIVITY): Troponin I (High Sensitivity): 10 ng/L (ref <18)

## 2023-01-07 LAB — LACTIC ACID, PLASMA: Lactic Acid, Venous: 1.9 mmol/L (ref 0.5–1.9)

## 2023-01-07 LAB — BRAIN NATRIURETIC PEPTIDE: B Natriuretic Peptide: 204.4 pg/mL — ABNORMAL HIGH (ref 0.0–100.0)

## 2023-01-07 MED ORDER — ENOXAPARIN SODIUM 30 MG/0.3ML IJ SOSY
30.0000 mg | PREFILLED_SYRINGE | INTRAMUSCULAR | Status: DC
  Administered 2023-01-08: 30 mg via SUBCUTANEOUS
  Filled 2023-01-07: qty 0.3

## 2023-01-07 MED ORDER — IPRATROPIUM-ALBUTEROL 0.5-2.5 (3) MG/3ML IN SOLN
3.0000 mL | Freq: Once | RESPIRATORY_TRACT | Status: AC
  Administered 2023-01-07: 3 mL via RESPIRATORY_TRACT
  Filled 2023-01-07: qty 3

## 2023-01-07 MED ORDER — IPRATROPIUM-ALBUTEROL 0.5-2.5 (3) MG/3ML IN SOLN
3.0000 mL | Freq: Four times a day (QID) | RESPIRATORY_TRACT | Status: DC
  Administered 2023-01-08 (×3): 3 mL via RESPIRATORY_TRACT
  Filled 2023-01-07 (×3): qty 3

## 2023-01-07 MED ORDER — PANTOPRAZOLE SODIUM 40 MG PO TBEC
40.0000 mg | DELAYED_RELEASE_TABLET | Freq: Two times a day (BID) | ORAL | Status: DC
  Administered 2023-01-08 – 2023-01-10 (×5): 40 mg via ORAL
  Filled 2023-01-07 (×5): qty 1

## 2023-01-07 MED ORDER — FUROSEMIDE 40 MG PO TABS: 20.0000 mg | ORAL_TABLET | Freq: Every day | ORAL | Status: DC

## 2023-01-07 MED ORDER — INSULIN ASPART 100 UNIT/ML IJ SOLN
0.0000 [IU] | Freq: Three times a day (TID) | INTRAMUSCULAR | Status: DC
  Administered 2023-01-08: 3 [IU] via SUBCUTANEOUS
  Administered 2023-01-08: 15 [IU] via SUBCUTANEOUS
  Administered 2023-01-08: 8 [IU] via SUBCUTANEOUS
  Administered 2023-01-09 (×2): 5 [IU] via SUBCUTANEOUS
  Administered 2023-01-09: 8 [IU] via SUBCUTANEOUS
  Administered 2023-01-10: 3 [IU] via SUBCUTANEOUS
  Filled 2023-01-07 (×6): qty 1

## 2023-01-07 MED ORDER — TAMSULOSIN HCL 0.4 MG PO CAPS
0.4000 mg | ORAL_CAPSULE | Freq: Every day | ORAL | Status: DC
  Administered 2023-01-08 – 2023-01-09 (×2): 0.4 mg via ORAL
  Filled 2023-01-07 (×2): qty 1

## 2023-01-07 MED ORDER — ONDANSETRON HCL 4 MG/2ML IJ SOLN: 4.0000 mg | Freq: Four times a day (QID) | INTRAMUSCULAR | Status: DC | PRN

## 2023-01-07 MED ORDER — CALCITRIOL 0.25 MCG PO CAPS
0.2500 ug | ORAL_CAPSULE | Freq: Every day | ORAL | Status: DC
  Administered 2023-01-08 – 2023-01-10 (×3): 0.25 ug via ORAL
  Filled 2023-01-07 (×3): qty 1

## 2023-01-07 MED ORDER — TREPROSTINIL 0.6 MG/ML IN SOLN
18.0000 ug | Freq: Every day | RESPIRATORY_TRACT | Status: DC
  Administered 2023-01-08: 18 ug via RESPIRATORY_TRACT

## 2023-01-07 MED ORDER — LISINOPRIL 10 MG PO TABS: 5.0000 mg | ORAL_TABLET | Freq: Every day | ORAL | Status: DC

## 2023-01-07 MED ORDER — METOPROLOL TARTRATE 50 MG PO TABS
50.0000 mg | ORAL_TABLET | Freq: Two times a day (BID) | ORAL | Status: DC
  Administered 2023-01-08 – 2023-01-10 (×5): 50 mg via ORAL
  Filled 2023-01-07: qty 2
  Filled 2023-01-07 (×4): qty 1

## 2023-01-07 MED ORDER — ATORVASTATIN CALCIUM 20 MG PO TABS
20.0000 mg | ORAL_TABLET | Freq: Every day | ORAL | Status: DC
  Administered 2023-01-08 – 2023-01-09 (×3): 20 mg via ORAL
  Filled 2023-01-07 (×3): qty 1

## 2023-01-07 MED ORDER — INSULIN GLARGINE-YFGN 100 UNIT/ML ~~LOC~~ SOLN
40.0000 [IU] | Freq: Every day | SUBCUTANEOUS | Status: DC
  Administered 2023-01-08 (×2): 40 [IU] via SUBCUTANEOUS
  Filled 2023-01-07 (×2): qty 0.4

## 2023-01-07 MED ORDER — MAGNESIUM OXIDE -MG SUPPLEMENT 400 (240 MG) MG PO TABS
400.0000 mg | ORAL_TABLET | Freq: Three times a day (TID) | ORAL | Status: DC
  Administered 2023-01-08 – 2023-01-10 (×8): 400 mg via ORAL
  Filled 2023-01-07 (×8): qty 1

## 2023-01-07 MED ORDER — SILDENAFIL CITRATE 20 MG PO TABS
20.0000 mg | ORAL_TABLET | Freq: Three times a day (TID) | ORAL | Status: DC
  Administered 2023-01-08 – 2023-01-10 (×8): 20 mg via ORAL
  Filled 2023-01-07 (×10): qty 1

## 2023-01-07 MED ORDER — SODIUM CHLORIDE 0.9 % IV SOLN: INTRAVENOUS | Status: AC

## 2023-01-07 MED ORDER — ACETAMINOPHEN 325 MG PO TABS: 650.0000 mg | ORAL_TABLET | Freq: Four times a day (QID) | ORAL | Status: DC | PRN

## 2023-01-07 MED ORDER — INSULIN ASPART 100 UNIT/ML IJ SOLN
0.0000 [IU] | Freq: Every day | INTRAMUSCULAR | Status: DC
  Administered 2023-01-08: 5 [IU] via SUBCUTANEOUS
  Administered 2023-01-08: 2 [IU] via SUBCUTANEOUS
  Administered 2023-01-09: 5 [IU] via SUBCUTANEOUS
  Filled 2023-01-07 (×3): qty 1

## 2023-01-07 MED ORDER — APIXABAN 5 MG PO TABS
5.0000 mg | ORAL_TABLET | Freq: Every day | ORAL | Status: DC
  Administered 2023-01-08 – 2023-01-10 (×3): 5 mg via ORAL
  Filled 2023-01-07 (×3): qty 1

## 2023-01-07 MED ORDER — COLCHICINE 0.6 MG PO TABS
0.6000 mg | ORAL_TABLET | Freq: Every day | ORAL | Status: DC
  Administered 2023-01-08 – 2023-01-10 (×3): 0.6 mg via ORAL
  Filled 2023-01-07 (×3): qty 1

## 2023-01-07 MED ORDER — ALLOPURINOL 100 MG PO TABS
200.0000 mg | ORAL_TABLET | Freq: Every day | ORAL | Status: DC
  Administered 2023-01-08 – 2023-01-10 (×3): 200 mg via ORAL
  Filled 2023-01-07 (×3): qty 2

## 2023-01-07 MED ORDER — GUAIFENESIN ER 600 MG PO TB12
600.0000 mg | ORAL_TABLET | Freq: Two times a day (BID) | ORAL | Status: DC
  Administered 2023-01-08 – 2023-01-10 (×6): 600 mg via ORAL
  Filled 2023-01-07 (×6): qty 1

## 2023-01-07 MED ORDER — SODIUM CHLORIDE 0.9 % IV SOLN
2.0000 g | Freq: Once | INTRAVENOUS | Status: AC
  Administered 2023-01-07: 2 g via INTRAVENOUS
  Filled 2023-01-07: qty 12.5

## 2023-01-07 MED ORDER — HYDROCODONE-ACETAMINOPHEN 5-325 MG PO TABS: 1.0000 | ORAL_TABLET | ORAL | Status: DC | PRN

## 2023-01-07 MED ORDER — ACETAMINOPHEN 650 MG RE SUPP: 650.0000 mg | Freq: Four times a day (QID) | RECTAL | Status: DC | PRN

## 2023-01-07 MED ORDER — INSULIN DEGLUDEC 200 UNIT/ML ~~LOC~~ SOPN: PEN_INJECTOR | Freq: Every day | SUBCUTANEOUS | Status: DC

## 2023-01-07 MED ORDER — FINASTERIDE 5 MG PO TABS
5.0000 mg | ORAL_TABLET | Freq: Every day | ORAL | Status: DC
  Administered 2023-01-08 – 2023-01-10 (×3): 5 mg via ORAL
  Filled 2023-01-07 (×3): qty 1

## 2023-01-07 MED ORDER — ONDANSETRON HCL 4 MG PO TABS: 4.0000 mg | ORAL_TABLET | Freq: Four times a day (QID) | ORAL | Status: DC | PRN

## 2023-01-07 MED ORDER — ALBUTEROL SULFATE (2.5 MG/3ML) 0.083% IN NEBU: 2.5000 mg | INHALATION_SOLUTION | RESPIRATORY_TRACT | Status: DC | PRN

## 2023-01-07 NOTE — ED Provider Notes (Signed)
[facility] Provider Note    Event Date/Time   First MD Initiated Contact with Patient 01/07/23 2021     (approximate)   History   Urinary Retention   HPI  Jay Morris is a 77 y.o. male  here with urinary retention. Pt states that over the past several days he has had intermittent worsening suprapubic discomfort, burning with urination, and "white" colored urine. He had a dorsal slit performed recently and intermittently catheterizes. He says he has had difficulty urinating during the day last 2 days so has had to cath more - usually only does this at night. He states over the day today, he has also had worsening SOB and chest tightness. H/o chronic hypoxia from COPD. No increased sputum production.       Physical Exam   Triage Vital Signs: ED Triage Vitals  Enc Vitals Group     BP 01/07/23 2022 (!) 156/73     Pulse Rate 01/07/23 2022 99     Resp 01/07/23 2022 19     Temp 01/07/23 2022 98.3 F (36.8 C)     Temp Source 01/07/23 2022 Oral     SpO2 01/07/23 2022 91 %     Weight 01/07/23 2023 191 lb (86.6 kg)     Height 01/07/23 2023 5\' 10"  (1.778 m)     Head Circumference --      Peak Flow --      Pain Score 01/07/23 2023 0     Pain Loc --      Pain Edu? --      Excl. in GC? --     Most recent vital signs: Vitals:   01/08/23 0030 01/08/23 0052  BP: (!) 134/53   Pulse: 92 89  Resp:  17  Temp:    SpO2: 98% 98%     General: Awake, no distress.  CV:  Good peripheral perfusion.  Resp:  Mild tachypnea, with bilateral rales. Diminished aeration, with scant expiratory wheezes. Abd:  No distention. Moderate suprapubic tenderness. No penile rash/lesions or skin breakdown.  Other:  No major edema.   ED Results / Procedures / Treatments   Labs (all labs ordered are listed, but only abnormal results are displayed) Labs Reviewed  BASIC METABOLIC PANEL - Abnormal; Notable for the following components:      Result Value   CO2 20 (*)     Glucose, Bld 143 (*)    BUN 36 (*)    Creatinine, Ser 2.61 (*)    Calcium 8.1 (*)    GFR, Estimated 24 (*)    All other components within normal limits  CBC - Abnormal; Notable for the following components:   WBC 10.9 (*)    RBC 2.95 (*)    Hemoglobin 9.6 (*)    HCT 29.6 (*)    MCV 100.3 (*)    All other components within normal limits  URINALYSIS, W/ REFLEX TO CULTURE (INFECTION SUSPECTED) - Abnormal; Notable for the following components:   Color, Urine YELLOW (*)    APPearance TURBID (*)    Hgb urine dipstick SMALL (*)    Protein, ur 100 (*)    Leukocytes,Ua LARGE (*)    All other components within normal limits  BRAIN NATRIURETIC PEPTIDE - Abnormal; Notable for the following components:   B Natriuretic Peptide 204.4 (*)    All other components within normal limits  CBG MONITORING, ED - Abnormal; Notable for the following components:   Glucose-Capillary 221 (*)  All other components within normal limits  SARS CORONAVIRUS 2 BY RT PCR  CULTURE, BLOOD (SINGLE)  URINE CULTURE  LACTIC ACID, PLASMA  LACTIC ACID, PLASMA  HEMOGLOBIN A1C  CBC  BASIC METABOLIC PANEL  TROPONIN I (HIGH SENSITIVITY)     EKG Normal sinus rhythm, VR 80. PR 177, QRS 82, QTc 453. No acute ST elevations or depressions.   RADIOLOGY CXR: Clear   I also independently reviewed and agree with radiologist interpretations.   PROCEDURES:  Critical Care performed: No  .1-3 Lead EKG Interpretation  Performed by: Shaune Pollack, MD Authorized by: Shaune Pollack, MD     Interpretation: normal     ECG rate:  70-90   ECG rate assessment: normal     Rhythm: sinus rhythm     Ectopy: none     Conduction: normal   Comments:     Indication: Weakness     MEDICATIONS ORDERED IN ED: Medications  allopurinol (ZYLOPRIM) tablet 200 mg (has no administration in time range)  colchicine tablet 0.6 mg (has no administration in time range)  atorvastatin (LIPITOR) tablet 20 mg (20 mg Oral Given 01/08/23  0029)  furosemide (LASIX) tablet 20 mg (has no administration in time range)  lisinopril (ZESTRIL) tablet 5 mg (has no administration in time range)  metoprolol tartrate (LOPRESSOR) tablet 50 mg (has no administration in time range)  sildenafil (REVATIO) tablet 20 mg (20 mg Oral Given 01/08/23 0030)  Treprostinil (TYVASO) inhalation solution 18 mcg (has no administration in time range)  calcitRIOL (ROCALTROL) capsule 0.25 mcg (has no administration in time range)  magnesium oxide (MAG-OX) tablet 400 mg (400 mg Oral Given 01/08/23 0029)  pantoprazole (PROTONIX) EC tablet 40 mg (has no administration in time range)  finasteride (PROSCAR) tablet 5 mg (has no administration in time range)  tamsulosin (FLOMAX) capsule 0.4 mg (has no administration in time range)  apixaban (ELIQUIS) tablet 5 mg (has no administration in time range)  acetaminophen (TYLENOL) tablet 650 mg (has no administration in time range)    Or  acetaminophen (TYLENOL) suppository 650 mg (has no administration in time range)  ondansetron (ZOFRAN) tablet 4 mg (has no administration in time range)    Or  ondansetron (ZOFRAN) injection 4 mg (has no administration in time range)  ipratropium-albuterol (DUONEB) 0.5-2.5 (3) MG/3ML nebulizer solution 3 mL (has no administration in time range)  albuterol (PROVENTIL) (2.5 MG/3ML) 0.083% nebulizer solution 2.5 mg (has no administration in time range)  insulin aspart (novoLOG) injection 0-5 Units (2 Units Subcutaneous Given 01/08/23 0042)  insulin aspart (novoLOG) injection 0-15 Units (has no administration in time range)  enoxaparin (LOVENOX) injection 30 mg (30 mg Subcutaneous Given 01/08/23 0030)  0.9 %  sodium chloride infusion ( Intravenous New Bag/Given 01/08/23 0030)  HYDROcodone-acetaminophen (NORCO/VICODIN) 5-325 MG per tablet 1-2 tablet (has no administration in time range)  guaiFENesin (MUCINEX) 12 hr tablet 600 mg (600 mg Oral Given 01/08/23 0029)  insulin glargine-yfgn (SEMGLEE)  injection 40 Units (40 Units Subcutaneous Given 01/08/23 0042)  ceFEPIme (MAXIPIME) 2 g in sodium chloride 0.9 % 100 mL IVPB (has no administration in time range)  ceFEPIme (MAXIPIME) 2 g in sodium chloride 0.9 % 100 mL IVPB (0 g Intravenous Stopped 01/07/23 2131)  ipratropium-albuterol (DUONEB) 0.5-2.5 (3) MG/3ML nebulizer solution 3 mL (3 mLs Nebulization Given 01/07/23 2117)  ipratropium-albuterol (DUONEB) 0.5-2.5 (3) MG/3ML nebulizer solution 3 mL (3 mLs Nebulization Given 01/07/23 2117)     IMPRESSION / MDM / ASSESSMENT AND PLAN / ED COURSE  I reviewed the triage vital signs and the nursing notes.                              Differential diagnosis includes, but is not limited to, sepsis 2/2 PNA, UTI, CHF, ACS, PE, anemia, AKI/ARF  Patient's presentation is most consistent with acute presentation with potential threat to life or bodily function.  The patient is on the cardiac monitor to evaluate for evidence of arrhythmia and/or significant heart rate changes   77 yo M with h/o intermittent self cathing here with dysuria, fatigue, hypoxia. Labs and history are consistent with acute UTI, with significant pyuria on UA and leukocytosis. Mild dehydration noted. LA normal. Pt also hypoxic - h/o chronic hypoxic, and he says that his lungs "flare up" when he gets sick. CXR shows no focal PNA. Pt started on breathing tx, empiric coverage for UTI and will admit to medicine.    FINAL CLINICAL IMPRESSION(S) / ED DIAGNOSES   Final diagnoses:  Acute cystitis without hematuria  Acute respiratory failure with hypoxia (HCC)     Rx / DC Orders   ED Discharge Orders     None        Note:  This document was prepared using Dragon voice recognition software and may include unintentional dictation errors.   Shaune Pollack, MD 01/08/23 [phone]

## 2023-01-07 NOTE — Assessment & Plan Note (Addendum)
COPD exacerbation Pulmonary fibrosis Moderate to severe pulmonary hypertension Baseline O2 requirement is 3 to 4 L, now requiring 6 to maintain sats in the low 90s Scheduled and as needed DuoNebs Flutter valve and incentive spirometer Continue treprostinil and Revatio Supplemental O2 to keep sats over 90 to 92% Holding off of steroids due to acute infection/UTI

## 2023-01-07 NOTE — Assessment & Plan Note (Signed)
Chronic anticoagulation Continue metoprolol and apixaban

## 2023-01-07 NOTE — Assessment & Plan Note (Signed)
Anemia of CKD 4 Creatinine and hemoglobin at baseline

## 2023-01-07 NOTE — Assessment & Plan Note (Signed)
No complaints of chest pain and EKG is nonacute Continue metoprolol, lisinopril, atorvastatin.  Patient is on apixaban

## 2023-01-07 NOTE — H&P (Incomplete)
History and Physical    Patient: Jay Morris WGN:[id number] DOB: Mar 10, 1946 DOA: 01/07/2023 DOS: the patient was seen and examined on 01/07/2023 PCP: Marguarite Arbour, MD  Patient coming from: Home  Chief Complaint:  Chief Complaint  Patient presents with   Urinary Retention    HPI: Jay Morris is a 77 y.o. 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  He has had no abdominal pain, fever or chills, nausea vomiting or diarrhea.  He denies chest pain, lower extremity pain or swelling. ED course and data review: Tachypneic to 24 with O2 sat 91% on 4 L improving to mid 90s on 6 L.  Other vitals within normal limits.  Labs notable for WBC of 11,000 with lactic acid 1.9.  Hemoglobin at baseline at 9.6.  Troponin 10 and BNP 204.  Creatinine at baseline of 2.61.  Urinalysis with large leukocyte esterase no bacteria seen.  COVID-negative. EKG, personally viewed and interpreted shows sinus at 80 with no concerning findings. Chest x-ray shows chronic pulmonary scarring and fibrosis with no acute airspace disease. Patient had a Foley placed, was started on cefepime for UTI and was treated with DuoNebs x 2 for COPD exacerbation. Hospitalist consulted for admission.      Past Medical History:  Diagnosis Date   (HFpEF) heart failure with preserved ejection fraction (HCC)    a.) TTE 04/01/2016: EF 60-65%, LAE, MAC, mild MR/AR/TR, mild-mod PR, G2DD; b.) TTE 12/18/2018: EF 55%, mild LVH, mild AR/MR/TR. mod PR; c.) TTE 05/31/2019: EF 60-65%, mild LVH, RAE/RVE, triv AR.MT, PR, mild TR; d.) TTE 10/26/2019: EF 55%, BAE, RVE, triv AR, mild MR/PR, sev TR, G2DD; e.) R/LHC 01/17/2020: mPA 52, PCWP 25, LVEDP 19, RVEDP 25, mRA 13   Anemia    Aortic atherosclerosis (HCC)    Asthma    BPH (benign prostatic hyperplasia)    CKD (chronic kidney disease), stage III (HCC)    COPD (chronic obstructive pulmonary disease) (HCC)    Coronary artery disease 01/17/2020   a.) LHC 01/17/2020: 25% oLAD, 65% o-pRCA, 45% pLAD, 65% mLAD, 35% OM2 --> med mgmt.   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   PAF (paroxysmal atrial fibrillation) (HCC)    a.) CHA2DS2-VASc = 6 (age x  2, HFpEF, HTN, vascular disease, T2DM);  b.) cardiac ablation in approx 2014-2015 at [facility]; c.) A.fib recurrence in setting of SARS-CoV-2 --> Tx'd with IV amiodarone + metoprolol + DCCV x 1 (200 J) 06/22/2019 restoring NSR; d.) rate/rhythm maintained without pharmacological intervention; chronically anticoagulated using standard dose apixaban   Peripheral vascular disease (HCC)    Phimosis of penis    Pneumonia due to COVID-19 virus 06/02/2019   a.) hospitalized at [facility]) from 06/02/2019 - 06/25/2019; Tx'd with steriods + remdesivir + convalescent plasma   Pneumonia due to COVID-19  virus 05/27/2022   a.) hospitalized at [facility] from 05/27/2022 - 05/30/2022 for acute on chronic respiratory failure secondary to SARS-CoV-2 PNA ; Tx'd with IV steroids + remdesivir   Pulmonary fibrosis (HCC)    Pulmonary HTN (HCC) 04/01/2016   a.) TTE 04/01/2016: EF 60-65%, RVSP 40; b.) TTE 12/18/2018: EF 55%, RVSP 45.9; c.) TTE 05/31/2019: EF 60-65%, RVSP 35.5; d.) TTE 10/26/2019: EF 55%, RVSP 100.4; e.) R/LHC 01/17/2020: mPA 52, PCWP 25, LVEDP 19, RVEDP 25, mRA 13; f.) Tx with PDE5i (sildenafil) + vasodilator (treprostinil)   Type 2 diabetes mellitus treated with insulin (HCC)    Past Surgical History:  Procedure Laterality Date   BACK SURGERY     x3 L3-L4    CARDIAC ELECTROPHYSIOLOGY MAPPING AND ABLATION N/A    performed at [facility] in approximately 2014-2015   CARDIOVERSION N/A 06/22/2019   Procedure: CARDIOVERSION;  Surgeon: Pricilla Riffle, MD;  Location: [facility] ENDOSCOPY;  Service: Cardiovascular;  Laterality: N/A;   COLONOSCOPY WITH PROPOFOL N/A 06/15/2017   Procedure: COLONOSCOPY WITH PROPOFOL;  Surgeon: Toledo, Boykin Nearing, MD;  Location: [facility] ENDOSCOPY;  Service: Gastroenterology;  Laterality: N/A;   DORSAL SLIT N/A 12/21/2022   Procedure: DORSAL SLIT;  Surgeon: Riki Altes, MD;  Location: [facility] ORS;  Service: Urology;  Laterality: N/A;   ENTEROSCOPY N/A 11/24/2017   Procedure: ENTEROSCOPY;  Surgeon: Wyline Mood, MD;  Location: [facility] ENDOSCOPY;  Service: Gastroenterology;  Laterality: N/A;   ESOPHAGOGASTRODUODENOSCOPY N/A 11/27/2017   Procedure: ESOPHAGOGASTRODUODENOSCOPY (EGD);  Surgeon: Midge Minium, MD;  Location: [facility] ENDOSCOPY;  Service: Endoscopy;  Laterality: N/A;   ESOPHAGOGASTRODUODENOSCOPY (EGD) WITH PROPOFOL N/A 06/02/2017   Procedure: ESOPHAGOGASTRODUODENOSCOPY (EGD) WITH PROPOFOL;  Surgeon: Toledo, Boykin Nearing, MD;  Location: [facility] ENDOSCOPY;  Service: Gastroenterology;  Laterality: N/A;   ESOPHAGOGASTRODUODENOSCOPY (EGD) WITH PROPOFOL N/A 08/04/2021    Procedure: ESOPHAGOGASTRODUODENOSCOPY (EGD) WITH PROPOFOL;  Surgeon: Regis Bill, MD;  Location: [facility] ENDOSCOPY;  Service: Endoscopy;  Laterality: N/A;   EVALUATION UNDER ANESTHESIA WITH HEMORRHOIDECTOMY N/A 02/12/2022   Procedure: EXAM UNDER ANESTHESIA WITH HEMORRHOIDECTOMY;  Surgeon: Sung Amabile, DO;  Location: [facility] ORS;  Service: General;  Laterality: N/A;   GIVENS CAPSULE STUDY N/A 11/25/2017   Procedure: GIVENS CAPSULE STUDY;  Surgeon: Wyline Mood, MD;  Location: [facility] ENDOSCOPY;  Service: Gastroenterology;  Laterality: N/A;   HAND SURGERY     RIGHT/LEFT HEART CATH AND CORONARY ANGIOGRAPHY N/A 01/17/2020   Procedure: RIGHT/LEFT HEART CATH AND CORONARY ANGIOGRAPHY;  Surgeon: Lamar Blinks, MD;  Location: [facility] INVASIVE CV LAB;  Service: Cardiovascular;  Laterality: N/A;   Social History:  reports that he quit smoking about 29 years ago. His smoking use included cigarettes. He has a 30.00 pack-year smoking history. He has never used smokeless tobacco. He reports that he does not drink alcohol and does not use drugs.  Allergies  Allergen Reactions   Shrimp [Shellfish Allergy] Anaphylaxis   Shrimp Extract  Family History  Problem Relation Age of Onset   Heart disease Mother    Cancer Father    Cancer Paternal Grandfather     Prior to Admission medications   Medication Sig Start Date End Date Taking? Authorizing Provider  acetaminophen (TYLENOL) 500 MG tablet Take 500-1,000 mg by mouth every 6 (six) hours as needed for moderate pain or headache.    Gillis Santa, MD  allopurinol (ZYLOPRIM) 100 MG tablet Take 200 mg by mouth daily.    [provider]  apixaban (ELIQUIS) 5 MG TABS tablet Take 5 mg by mouth daily.    [provider]  ascorbic acid (VITAMIN C) 500 MG tablet Take 500 mg by mouth daily.    [provider]  atorvastatin (LIPITOR) 20 MG tablet Take 20 mg by mouth at bedtime.     [provider]  calcitRIOL (ROCALTROL) 0.25 MCG  capsule Take 0.25 mcg by mouth daily.    [provider]  Cholecalciferol (DIALYVITE VITAMIN D 5000) 125 MCG (5000 UT) capsule Take 5,000 Units by mouth daily.    [provider]  colchicine 0.6 MG tablet Take 0.6 mg by mouth daily. 08/20/22   [provider]  finasteride (PROSCAR) 5 MG tablet TAKE 1 TABLET BY MOUTH DAILY 07/20/22   McGowan, Carollee Herter A, PA-C  fluticasone furoate-vilanterol (BREO ELLIPTA) 100-25 MCG/ACT AEPB Inhale 1 puff into the lungs daily.    [provider]  furosemide (LASIX) 20 MG tablet Take 20 mg by mouth daily.    [provider]  insulin aspart (NOVOLOG) 100 UNIT/ML FlexPen Inject 35-45 Units into the skin 3 (three) times daily with meals. (Based upon blood glucose reading)    [provider]  insulin degludec (TRESIBA FLEXTOUCH) 200 UNIT/ML FlexTouch Pen Inject 80 Units into the skin at bedtime.    [provider]  lidocaine (XYLOCAINE) 5 % ointment Apply 1 Application topically 3 (three) times daily as needed. 10/20/22   Vaillancourt, Lelon Mast, PA-C  lisinopril (ZESTRIL) 5 MG tablet Take 5 mg by mouth daily.    [provider]  magnesium oxide (MAG-OX) 400 MG tablet Take 400 mg by mouth 3 (three) times daily.    [provider]  metoprolol tartrate (LOPRESSOR) 25 MG tablet Take 2 tablets (50 mg total) by mouth 2 (two) times daily. 05/30/22   Joseph Art, DO  nystatin-triamcinolone ointment (MYCOLOG) Apply 1 Application topically 2 (two) times daily. Patient taking differently: Apply 1 Application topically as needed. 10/20/22   Vaillancourt, Lelon Mast, PA-C  pantoprazole (PROTONIX) 40 MG tablet Take 1 tablet (40 mg total) by mouth 2 (two) times daily before a meal. 06/25/19   Glade Lloyd, MD  sildenafil (REVATIO) 20 MG tablet Take 20 mg by mouth 3 (three) times daily.    [provider]  tamsulosin (FLOMAX) 0.4 MG CAPS capsule Take 0.4 mg by mouth daily after supper.    [provider]  Treprostinil (TYVASO) 0.6 MG/ML SOLN Inhale 18 mcg into the lungs daily.    Gillis Santa, MD  vitamin B-12 (CYANOCOBALAMIN) 1000 MCG tablet Take 1,000 mcg by mouth daily.    [provider]    Physical Exam: Vitals:   01/07/23 2022 01/07/23 2023 01/07/23 2030 01/07/23 2145  BP: (!) 156/73  134/62 (!) 130/47  Pulse: 99  89 100  Resp: 19   (!) 24  Temp: 98.3 F (36.8 C)     TempSrc: Oral     SpO2: 91%   90%  Weight:  86.6 kg    Height:  5\' 10"  (1.778 m)     Physical Exam Vitals and nursing note reviewed.  Constitutional:      General: He is not in acute distress. HENT:     Head: Normocephalic and atraumatic.  Cardiovascular:     Rate and Rhythm: Normal rate and regular rhythm.     Heart sounds: Normal heart sounds.  Pulmonary:     Effort: Pulmonary effort is normal.     Breath sounds: Normal breath sounds.  Abdominal:     Palpations: Abdomen is soft.     Tenderness: There is no abdominal tenderness.  Neurological:     Mental Status: Mental status is at baseline.     Labs on Admission: I have personally reviewed following labs and imaging studies  CBC: Recent Labs  Lab 01/07/23 2028  WBC 10.9*  HGB 9.6*  HCT 29.6*  MCV 100.3*  PLT 226   Basic Metabolic Panel: Recent Labs  Lab 01/07/23 2028  NA 135  K 3.7  CL 105  CO2 20*  GLUCOSE 143*  BUN 36*  CREATININE 2.61*  CALCIUM 8.1*   GFR: Estimated Creatinine Clearance: 23.7 mL/min (A) (by C-G formula based on SCr of 2.61 mg/dL (H)). Liver Function Tests: No results for input(s): "AST", "ALT", "ALKPHOS", "BILITOT", "PROT", "ALBUMIN" in the last 168 hours. No results for input(s): "LIPASE", "AMYLASE" in the last 168 hours. No results for input(s): "AMMONIA" in the last 168 hours. Coagulation Profile: No results for input(s): "INR", "PROTIME" in the last 168 hours. Cardiac Enzymes: No results for input(s): "CKTOTAL", "CKMB", "CKMBINDEX", "TROPONINI" in the last 168 hours. BNP  (last 3 results) No results for input(s): "PROBNP" in the last 8760 hours. HbA1C: No results for input(s): "HGBA1C" in the last 72 hours. CBG: No results for input(s): "GLUCAP" in the last 168 hours. Lipid Profile: No results for input(s): "CHOL", "HDL", "LDLCALC", "TRIG", "CHOLHDL", "LDLDIRECT" in the last 72 hours. Thyroid Function Tests: No results for input(s): "TSH", "T4TOTAL", "FREET4", "T3FREE", "THYROIDAB" in the last 72 hours. Anemia Panel: No results for input(s): "VITAMINB12", "FOLATE", "FERRITIN", "TIBC", "IRON", "RETICCTPCT" in the last 72 hours. Urine analysis:    Component Value Date/Time   COLORURINE YELLOW (A) 01/07/2023 2058   APPEARANCEUR TURBID (A) 01/07/2023 2058   APPEARANCEUR Cloudy (A) 01/05/2023 1435   LABSPEC 1.010 01/07/2023 2058   PHURINE 6.0 01/07/2023 2058   GLUCOSEU NEGATIVE 01/07/2023 2058   HGBUR SMALL (A) 01/07/2023 2058   BILIRUBINUR NEGATIVE 01/07/2023 2058   BILIRUBINUR Negative 01/05/2023 1435   KETONESUR NEGATIVE 01/07/2023 2058   PROTEINUR 100 (A) 01/07/2023 2058   NITRITE NEGATIVE 01/07/2023 2058   LEUKOCYTESUR LARGE (A) 01/07/2023 2058    Radiological Exams on Admission: DG Chest Portable 1 View  Result Date: 01/07/2023 CLINICAL DATA:  Dysuria, shortness of breath EXAM: PORTABLE CHEST 1 VIEW COMPARISON:  05/27/2022 FINDINGS: Single frontal view of the chest demonstrates a stable cardiac silhouette. Chronic bilateral parenchymal lung scarring and fibrosis unchanged. No airspace disease, effusion, or pneumothorax. No acute bony abnormalities. IMPRESSION: 1. Chronic pulmonary scarring and fibrosis. No acute airspace disease. Electronically Signed   By: Sharlet Salina M.D.   On: 01/07/2023 20:55     Data Reviewed: Relevant notes from primary care and specialist visits, past discharge summaries as available in EHR, including Care Everywhere. Prior diagnostic testing as pertinent to current admission diagnoses Updated medications and problem  lists for reconciliation ED course, including vitals, labs, imaging, treatment and response to  treatment Triage notes, nursing and pharmacy notes and ED provider's notes Notable results as noted in HPI   Assessment and Plan: Complicated urinary tract infection Chronic urinary retention secondary to BPH with intermittent catheterization Phimosis s/p dorsal slit penis on 12/21/22 Urinalysis consistent with UTI Patient was seen by urology on 6/19 and slit is healing well Follow cultures Continue cefepime for complicated UTI Foley was placed in the ED  Acute on chronic respiratory failure with hypoxia (HCC) COPD exacerbation Pulmonary fibrosis Moderate to severe pulmonary hypertension Baseline O2 requirement is 3 to 4 L, now requiring 6 to maintain sats in the low 90s Scheduled and as needed DuoNebs Flutter valve and incentive spirometer Continue treprostinil and Revatio Supplemental O2 to keep sats over 90 to 92% Holding off of steroids due to acute infection/UTI    Atrial fibrillation (HCC) Chronic anticoagulation Continue metoprolol and apixaban  Chronic diastolic CHF (congestive heart failure) (HCC) Clinically euvolemic, BNP was 204 Continue metoprolol, lisinopril, furosemide  Coronary artery disease involving native coronary artery of native heart No complaints of chest pain and EKG is nonacute Continue metoprolol, lisinopril, atorvastatin.  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  BP controlled  Type II diabetes mellitus with renal manifestations (HCC) Continue basal insulin Sliding scale coverage     DVT prophylaxis: Apixaban  Consults: none  Advance Care Planning:   Code Status: Prior   Family Communication: none  Disposition Plan: Back to previous home environment  Severity of Illness: The appropriate patient status for this  patient is INPATIENT. Inpatient status is judged to be reasonable and necessary in order to provide the required intensity of service to ensure the patient's safety. The patient's presenting symptoms, physical exam findings, and initial radiographic and laboratory data in the context of their chronic comorbidities is felt to place them at high risk for further clinical deterioration. Furthermore, it is not anticipated that the patient will be medically stable for discharge from the hospital within 2 midnights of admission.   * I certify that at the point of admission it is my clinical judgment that the patient will require inpatient hospital care spanning beyond 2 midnights from the point of admission due to high intensity of service, high risk for further deterioration and high frequency of surveillance required.*  Author: Andris Baumann, MD 01/07/2023 11:15 PM  For on call review [email or web address].

## 2023-01-07 NOTE — Assessment & Plan Note (Signed)
Continue home meds.  BP controlled

## 2023-01-07 NOTE — ED Notes (Signed)
ED Provider at bedside. 

## 2023-01-07 NOTE — Assessment & Plan Note (Signed)
Clinically euvolemic, BNP was 204 Continue metoprolol, lisinopril, furosemide

## 2023-01-07 NOTE — Assessment & Plan Note (Signed)
Chronic urinary retention secondary to phimosis s/p dorsal slit penis on 6/4 Chronic intermittent catheterization Urinalysis consistent with UTI Patient was seen by urology on 6/19 and slit is healing well Follow cultures Continue cefepime for complicated UTI Foley was placed in the ED

## 2023-01-07 NOTE — ED Triage Notes (Signed)
Pt arrived via EMS. Per EMS pt has been having urinary burning and pain. Per EMS pt uses a catheter at night time only. Pt was placed on 6l/min via [place] due to a low o2 sat. EMS sts that pt does wear o2 at all times at 2l/min via [place]. EMS sts that pt was 84% on 2l/min and the 6l/min brought him up to 91%

## 2023-01-07 NOTE — Assessment & Plan Note (Signed)
Continue basal insulin Sliding scale coverage 

## 2023-01-07 NOTE — Consult Note (Signed)
PHARMACY -  BRIEF ANTIBIOTIC NOTE   Pharmacy has received consult(s) for UTI from an ED provider.  The patient's profile has been reviewed for ht/wt/allergies/indication/available labs.    One time order(s) placed for cefepime  Further antibiotics/pharmacy consults should be ordered by admitting physician if indicated.                       Thank you, Ronnald Ramp 01/07/2023  8:52 PM

## 2023-01-08 ENCOUNTER — Encounter: Payer: Self-pay | Admitting: Internal Medicine

## 2023-01-08 ENCOUNTER — Other Ambulatory Visit: Payer: Self-pay

## 2023-01-08 DIAGNOSIS — N471 Phimosis: Secondary | ICD-10-CM

## 2023-01-08 DIAGNOSIS — Z789 Other specified health status: Secondary | ICD-10-CM

## 2023-01-08 LAB — GLUCOSE, CAPILLARY
Glucose-Capillary: 280 mg/dL — ABNORMAL HIGH (ref 70–99)
Glucose-Capillary: 352 mg/dL — ABNORMAL HIGH (ref 70–99)
Glucose-Capillary: 377 mg/dL — ABNORMAL HIGH (ref 70–99)

## 2023-01-08 LAB — CBC
HCT: 28.1 % — ABNORMAL LOW (ref 39.0–52.0)
Hemoglobin: 8.8 g/dL — ABNORMAL LOW (ref 13.0–17.0)
MCH: 32.1 pg (ref 26.0–34.0)
MCHC: 31.3 g/dL (ref 30.0–36.0)
MCV: 102.6 fL — ABNORMAL HIGH (ref 80.0–100.0)
Platelets: 201 10*3/uL (ref 150–400)
RBC: 2.74 MIL/uL — ABNORMAL LOW (ref 4.22–5.81)
RDW: 15 % (ref 11.5–15.5)
WBC: 9.6 10*3/uL (ref 4.0–10.5)
nRBC: 0 % (ref 0.0–0.2)

## 2023-01-08 LAB — BASIC METABOLIC PANEL
Anion gap: 10 (ref 5–15)
BUN: 35 mg/dL — ABNORMAL HIGH (ref 8–23)
CO2: 24 mmol/L (ref 22–32)
Calcium: 8.2 mg/dL — ABNORMAL LOW (ref 8.9–10.3)
Chloride: 106 mmol/L (ref 98–111)
Creatinine, Ser: 2.47 mg/dL — ABNORMAL HIGH (ref 0.61–1.24)
GFR, Estimated: 26 mL/min — ABNORMAL LOW (ref >60)
Glucose, Bld: 176 mg/dL — ABNORMAL HIGH (ref 70–99)
Potassium: 4 mmol/L (ref 3.5–5.1)
Sodium: 140 mmol/L (ref 135–145)

## 2023-01-08 LAB — CBG MONITORING, ED
Glucose-Capillary: 168 mg/dL — ABNORMAL HIGH (ref 70–99)
Glucose-Capillary: 221 mg/dL — ABNORMAL HIGH (ref 70–99)

## 2023-01-08 LAB — LACTIC ACID, PLASMA: Lactic Acid, Venous: 3 mmol/L (ref 0.5–1.9)

## 2023-01-08 LAB — RESPIRATORY PANEL BY PCR

## 2023-01-08 LAB — CULTURE, BLOOD (SINGLE)

## 2023-01-08 MED ORDER — FUROSEMIDE 10 MG/ML IJ SOLN
20.0000 mg | Freq: Once | INTRAMUSCULAR | Status: AC
  Administered 2023-01-08: 20 mg via INTRAVENOUS
  Filled 2023-01-08: qty 4

## 2023-01-08 MED ORDER — SODIUM CHLORIDE 0.9 % IV SOLN
1.0000 g | INTRAVENOUS | Status: DC
  Administered 2023-01-08 – 2023-01-09 (×2): 1 g via INTRAVENOUS
  Filled 2023-01-08 (×3): qty 10

## 2023-01-08 MED ORDER — CHLORHEXIDINE GLUCONATE CLOTH 2 % EX PADS
6.0000 | MEDICATED_PAD | Freq: Every day | CUTANEOUS | Status: DC
  Administered 2023-01-08 – 2023-01-09 (×2): 6 via TOPICAL

## 2023-01-08 MED ORDER — PREDNISONE 20 MG PO TABS
40.0000 mg | ORAL_TABLET | Freq: Every day | ORAL | Status: DC
  Administered 2023-01-08 – 2023-01-10 (×3): 40 mg via ORAL
  Filled 2023-01-08 (×3): qty 2

## 2023-01-08 MED ORDER — SODIUM CHLORIDE 0.9 % IV SOLN: 2.0000 g | INTRAVENOUS | Status: DC

## 2023-01-08 MED ORDER — IPRATROPIUM-ALBUTEROL 0.5-2.5 (3) MG/3ML IN SOLN
3.0000 mL | Freq: Three times a day (TID) | RESPIRATORY_TRACT | Status: DC
  Administered 2023-01-08 – 2023-01-10 (×5): 3 mL via RESPIRATORY_TRACT
  Filled 2023-01-08 (×5): qty 3

## 2023-01-08 NOTE — Progress Notes (Addendum)
PROGRESS NOTE    Jay Morris  QMV:[id number] DOB: 1946/03/21 DOA: 01/07/2023 PCP: Marguarite Arbour, MD  Outpatient Specialists: cardiology, endo, urology, pulm    Brief Narrative:   From admission h and p  Jay Morris is a 77 y.o. 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  He has had no abdominal pain, fever or chills, nausea vomiting or diarrhea.  He denies chest pain, lower extremity pain or swelling.  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   Acute on chronic respiratory failure with hypoxia (HCC)   COPD with acute exacerbation (HCC)   Pulmonary hypertension, moderate to severe (HCC)   Pulmonary fibrosis (HCC)   Coronary artery disease involving native coronary artery of native heart   Chronic diastolic CHF (congestive heart failure) (HCC)   Atrial fibrillation (HCC)   Long term current use of anticoagulant   CKD (chronic kidney disease), stage IV (HCC)   Anemia in stage 4 chronic kidney disease (HCC)   Type II diabetes mellitus with renal manifestations (HCC)   HTN (hypertension)   Anemia, unspecified   BPH  (benign prostatic hyperplasia)  # UTI, complicated # Phimosis Hx bph, intermittent caths at home (able to urinate some as well), dorsal slit procedure earlier this month, cloudy urine for several days, dysuria for at least one day. No fever, normal lactate. Urinalysis suggestive of infection.   - discussed w/ urology, advise either more frequent cic or foley, will opt for latter, needs better bladder decompression - f/u urine culture (from 6/19 urology visit as well as from the ER on 6/21), 6/19 culture is growing GNRs - stop cefepime, start ceftriaxone - f/u blood cultures - outpt urology f/u  # COPD with exacerbation # Pulmonary fibrosis Hx copd, pulmonary fibrosis. Nothing focal on CXR but increased dyspnea for one day with non-productive cough, increased o2 requirement from baseline - continue ceftriaxone - start prednisone - continue duonebs  # Acute on chronic hypoxic respiratory failure On 3 liters at home, here requiring 4, in setting of copd exacerbation - [place] o2  # A-fib Rate controlled - cont home metop and apixaban  # T2DM Glucose controlled - continue basal/bolus and SSI  # CKD4 Kidney function is at baseline - monitor  # Gout - cont home allopurinol, colchicine  # CAD Hx 3-vessel disease, medically managed. Normal trop - cont home atorvastatin  # BPH - cont home finasteride, flomax  # HFpEF No pulm edema on CXR, does have some lower extremity edema, bnp elevated to 200s - will substitute lasix 20 iv for home lasix 20 oral today  #  HTN Bp controlled - cont home metop - will hold home lisinopril while diuresing  # pHTN - cont home sildenafil, tyvaso  # Weakness - pt consult    DVT prophylaxis: home apixaban Code Status: full Family Communication: daughter updated telephonically 6/22  Level of care: Progressive Status is: Inpatient Remains inpatient appropriate because: severity of illness    Consultants:   none  Procedures: none  Antimicrobials:  Cefepime>ceftriaxone    Subjective: No pain, mild dyspnea  Objective: Vitals:   01/08/23 0530 01/08/23 0600 01/08/23 0630 01/08/23 0705  BP: (!) 157/75 (!) 165/63 (!) 151/61   Pulse: 79 88 79 78  Resp: 20 (!) 22 15 15   Temp:      TempSrc:      SpO2: 98% 99% 99% 97%  Weight:      Height:        Intake/Output Summary (Last 24 hours) at 01/08/2023 0827 Last data filed at 01/07/2023 2131 Gross per 24 hour  Intake 100 ml  Output --  Net 100 ml   Filed Weights   01/07/23 2023  Weight: 86.6 kg    Examination:  General exam: Appears calm and comfortable  Respiratory system: scattered rales/rhonchi Cardiovascular system: S1 & S2 heard, rr Gastrointestinal system: Abdomen is obese, soft and nontender. No organomegaly or masses felt. Normal bowel sounds heard. Central nervous system: Alert and oriented. No focal neurological deficits. Extremities: Symmetric 5 x 5 power. GU: pus at meatus Skin: No rashes, lesions or ulcers Psychiatry: Judgement and insight appear normal. Mood & affect appropriate.     Data Reviewed: I have personally reviewed following labs and imaging studies  CBC: Recent Labs  Lab 01/07/23 2028 01/08/23 0648  WBC 10.9* 9.6  HGB 9.6* 8.8*  HCT 29.6* 28.1*  MCV 100.3* 102.6*  PLT 226 201   Basic Metabolic Panel: Recent Labs  Lab 01/07/23 2028 01/08/23 0648  NA 135 140  K 3.7 4.0  CL 105 106  CO2 20* 24  GLUCOSE 143* 176*  BUN 36* 35*  CREATININE 2.61* 2.47*  CALCIUM 8.1* 8.2*   GFR: Estimated Creatinine Clearance: 25 mL/min (A) (by C-G formula based on SCr of 2.47 mg/dL (H)). Liver Function Tests: No results for input(s): "AST", "ALT", "ALKPHOS", "BILITOT", "PROT", "ALBUMIN" in the last 168 hours. No results for input(s): "LIPASE", "AMYLASE" in the last 168 hours. No results for input(s): "AMMONIA" in the last 168 hours. Coagulation Profile: No results for input(s): "INR", "PROTIME" in  the last 168 hours. Cardiac Enzymes: No results for input(s): "CKTOTAL", "CKMB", "CKMBINDEX", "TROPONINI" in the last 168 hours. BNP (last 3 results) No results for input(s): "PROBNP" in the last 8760 hours. HbA1C: No results for input(s): "HGBA1C" in the last 72 hours. CBG: Recent Labs  Lab 01/08/23 0035 01/08/23 0757  GLUCAP 221* 168*   Lipid Profile: No results for input(s): "CHOL", "HDL", "LDLCALC", "TRIG", "CHOLHDL", "LDLDIRECT" in the last 72 hours. Thyroid Function Tests: No results for input(s): "TSH", "T4TOTAL", "FREET4", "T3FREE", "THYROIDAB" in the last 72 hours. Anemia Panel: No results for input(s): "VITAMINB12", "FOLATE", "FERRITIN", "TIBC", "IRON", "RETICCTPCT" in the last 72 hours. Urine analysis:    Component Value Date/Time   COLORURINE YELLOW (A) 01/07/2023 2058   APPEARANCEUR TURBID (A) 01/07/2023 2058   APPEARANCEUR Cloudy (A) 01/05/2023 1435   LABSPEC 1.010 01/07/2023 2058   PHURINE 6.0 01/07/2023 2058   GLUCOSEU NEGATIVE 01/07/2023 2058   HGBUR SMALL (A) 01/07/2023 2058   BILIRUBINUR NEGATIVE 01/07/2023 2058   BILIRUBINUR Negative 01/05/2023 1435  KETONESUR NEGATIVE 01/07/2023 2058   PROTEINUR 100 (A) 01/07/2023 2058   NITRITE NEGATIVE 01/07/2023 2058   LEUKOCYTESUR LARGE (A) 01/07/2023 2058   Sepsis Labs: @LABRCNTIP (procalcitonin:4,lacticidven:4)  ) Recent Results (from the past 240 hour(s))  Microscopic Examination     Status: Abnormal   Collection Time: 01/05/23  2:35 PM   Urine  Result Value Ref Range Status   WBC, UA >30 (A) 0 - 5 /hpf Final   RBC, Urine 3-10 (A) 0 - 2 /hpf Final   Epithelial Cells (non renal) 0-10 0 - 10 /hpf Final   Bacteria, UA Many (A) None seen/Few Final  CULTURE, URINE COMPREHENSIVE     Status: Abnormal (Preliminary result)   Collection Time: 01/05/23  4:08 PM   Specimen: Urine   UR  Result Value Ref Range Status   Urine Culture, Comprehensive Preliminary report (A)  Preliminary   Organism ID, Bacteria Gram  negative rods (A)  Preliminary    Comment: Greater than 100,000 colony forming units per mL   Organism ID, Bacteria Comment  Preliminary    Comment: Microbiological testing to rule out the presence of possible pathogens is in progress.   Blood culture (single)     Status: None (Preliminary result)   Collection Time: 01/07/23  8:28 PM   Specimen: BLOOD RIGHT HAND  Result Value Ref Range Status   Specimen Description BLOOD RIGHT HAND  Final   Special Requests   Final    BOTTLES DRAWN AEROBIC AND ANAEROBIC Blood Culture adequate volume   Culture   Final    NO GROWTH < 12 HOURS Performed at [facility], [address], [place], [place] [address]    Report Status PENDING  Incomplete  SARS Coronavirus 2 by RT PCR (hospital order, performed in [facility] hospital lab) *cepheid single result test* Anterior Nasal Swab     Status: None   Collection Time: 01/07/23  8:30 PM   Specimen: Anterior Nasal Swab  Result Value Ref Range Status   SARS Coronavirus 2 by RT PCR NEGATIVE NEGATIVE Final    Comment: (NOTE) SARS-CoV-2 target nucleic acids are NOT DETECTED.  The SARS-CoV-2 RNA is generally detectable in upper and lower respiratory specimens during the acute phase of infection. The lowest concentration of SARS-CoV-2 viral copies this assay can detect is 250 copies / mL. A negative result does not preclude SARS-CoV-2 infection and should not be used as the sole basis for treatment or other patient management decisions.  A negative result may occur with improper specimen collection / handling, submission of specimen other than nasopharyngeal swab, presence of viral mutation(s) within the areas targeted by this assay, and inadequate number of viral copies (<250 copies / mL). A negative result must be combined with clinical observations, patient history, and epidemiological information.  Fact Sheet for Patients:   RoadLapTop.co.za  Fact Sheet for  Healthcare Providers: http://kim-miller.com/  This test is not yet approved or  cleared by the [place] FDA and has been authorized for detection and/or diagnosis of SARS-CoV-2 by FDA under an Emergency Use Authorization (EUA).  This EUA will remain in effect (meaning this test can be used) for the duration of the COVID-19 declaration under Section 564(b)(1) of the Act, 21 U.S.C. section 360bbb-3(b)(1), unless the authorization is terminated or revoked sooner.  Performed at [facility], [address], [place], [place] [address]          Radiology Studies: DG Chest Portable 1 View  Result Date: 01/07/2023 CLINICAL DATA:  Dysuria, shortness of breath EXAM: PORTABLE CHEST 1 VIEW COMPARISON:  05/27/2022 FINDINGS: Single frontal view of the chest demonstrates a stable cardiac silhouette. Chronic bilateral parenchymal lung scarring and fibrosis unchanged. No airspace disease, effusion, or pneumothorax. No acute bony abnormalities. IMPRESSION: 1. Chronic pulmonary scarring and fibrosis. No acute airspace disease. Electronically Signed   By: Sharlet Salina M.D.   On: 01/07/2023 20:55        Scheduled Meds:  allopurinol  200 mg Oral Daily   apixaban  5 mg Oral Daily   atorvastatin  20 mg Oral QHS   calcitRIOL  0.25 mcg Oral Daily   colchicine  0.6 mg Oral Daily   enoxaparin (LOVENOX) injection  30 mg Subcutaneous Q24H   finasteride  5 mg Oral Daily   furosemide  20 mg Oral Daily   guaiFENesin  600 mg Oral BID   insulin aspart  0-15 Units Subcutaneous TID WC   insulin aspart  0-5 Units Subcutaneous QHS   insulin glargine-yfgn  40 Units Subcutaneous Q2200   ipratropium-albuterol  3 mL Nebulization Q6H   lisinopril  5 mg Oral Daily   magnesium oxide  400 mg Oral TID   metoprolol tartrate  50 mg Oral BID   pantoprazole  40 mg Oral BID AC   sildenafil  20 mg Oral TID   tamsulosin  0.4 mg Oral QPC supper   Treprostinil  18 mcg Inhalation Daily    Continuous Infusions:  cefTRIAXone (ROCEPHIN)  IV       LOS: 1 day     Silvano Bilis, MD [facility]   If 7PM-7AM, please contact night-coverage [email or web address] Password [place] 01/08/2023, 8:27 AM

## 2023-01-08 NOTE — Progress Notes (Signed)
Pharmacy Antibiotic Note  Jay Morris is a 77 y.o. male admitted on 01/07/2023 with UTI.  Pharmacy has been consulted for cefepime dosing.  Plan: Cefepime 2 gm IV X 1 given in ED on 6/21 @ 2101. Cefepime 2 gm IV Q24H ordered to continue on 6/22 @ 2100.   Height: 5\' 10"  (177.8 cm) Weight: 86.6 kg (191 lb) IBW/kg (Calculated) : 73  Temp (24hrs), Avg:98.3 F (36.8 C), Min:98.3 F (36.8 C), Max:98.3 F (36.8 C)  Recent Labs  Lab 01/07/23 2028  WBC 10.9*  CREATININE 2.61*  LATICACIDVEN 1.9    Estimated Creatinine Clearance: 23.7 mL/min (A) (by C-G formula based on SCr of 2.61 mg/dL (H)).    Allergies  Allergen Reactions   Shrimp [Shellfish Allergy] Anaphylaxis   Shrimp Extract     Antimicrobials this admission:   >>    >>   Dose adjustments this admission:   Microbiology results:  BCx:   UCx:    Sputum:    MRSA PCR:   Thank you for allowing pharmacy to be a part of this patient's care.  Nimesh Riolo D 01/08/2023 12:23 AM

## 2023-01-08 NOTE — ED Notes (Signed)
Advised nurse that patient has ready bed 

## 2023-01-08 NOTE — Consult Note (Signed)
PHARMACIST - PHYSICIAN COMMUNICATION   CONCERNING: Tyvaso   Spoke with patient and his wife about nonfomulary medication, Tyvaso. Wife is agreeable to bring in Tyvaso morning of 6/23 for pharmacy to verify prior to administrating the medication to the patient. She expressed understanding of hospital policy to verify and dispense home medications from inpatient pharmacy, but she states that she would prefer to set up the device herself as it is complex and requires multiple steps and pieces.   Plan to verify medication brought in by wife morning of 6/23 prior to wife setting up Tyvaso device for use inpatient. Appreciate assistance from nursing in facilitating this process.  Celene Squibb, PharmD Clinical Pharmacist 01/08/2023 5:50 PM

## 2023-01-08 NOTE — Evaluation (Signed)
Physical Therapy Evaluation Patient Details Name: Jay Morris MRN: [id number] DOB: 13-Jan-1946 Today's Date: 01/08/2023  History of Present Illness  Jay Morris is a 77 y.o. male with medical history significant for A-fib on Eliquis, CAD, diastolic CHF, HTN and DM, COPD, pulmonary fibrosis and pulmonary hypertension on home O2 at 3 to 4 L, chronic urinary retention secondary to phimosis s/p dorsal slit on 12/21/2022 and presents to the emergency room with concern for urinary tract infection due to a several day history of burning with urination and cloudy urine. Upon arrival pt with oxygen saturation of 84% on 2L increased to 6L with satuation improvement to 91%. Pt with COPD exacerbation.  Clinical Impression  The pt presents today in great spirits. He presents with some higher level balance deficits demonstrated by 3 LOB with direction changing activity and single limb stance. The pt presented with some SOB following standing marching but was able to recover with seated rest break. His 5xSTS time is indicative of increased risk of falling. He would greatly benefit from post acute HHPT once medically stable for d/c.        Recommendations for follow up therapy are one component of a multi-disciplinary discharge planning process, led by the attending physician.  Recommendations may be updated based on patient status, additional functional criteria and insurance authorization.  Follow Up Recommendations       Assistance Recommended at Discharge Set up Supervision/Assistance  Patient can return home with the following  Help with stairs or ramp for entrance    Equipment Recommendations None recommended by PT  Recommendations for Other Services       Functional Status Assessment Patient has had a recent decline in their functional status and demonstrates the ability to make significant improvements in function in a reasonable and predictable amount of time.     Precautions / Restrictions  Precautions Precautions: Fall Restrictions Weight Bearing Restrictions: No      Mobility  Bed Mobility Overal bed mobility: Needs Assistance Bed Mobility: Supine to Sit, Sit to Supine     Supine to sit: Supervision Sit to supine: Supervision        Transfers Overall transfer level: Needs assistance Equipment used: None Transfers: Sit to/from Stand Sit to Stand: Supervision                Ambulation/Gait Ambulation/Gait assistance: Min assist             General Gait Details: Standing marching with 1 LOB. Lateral side steps with x2 LOB with change in direction.  Stairs            Wheelchair Mobility    Modified Rankin (Stroke Patients Only)       Balance Overall balance assessment: Needs assistance   Sitting balance-Leahy Scale: Good     Standing balance support: No upper extremity supported, During functional activity Standing balance-Leahy Scale: Fair                               Pertinent Vitals/Pain Pain Assessment Pain Assessment: 0-10 Pain Score: 3  Pain Location: Chronic back pain Pain Descriptors / Indicators: Aching Pain Intervention(s): Monitored during session (Educated that medication is available for back pain PRN)    Home Living Family/patient expects to be discharged to:: Private residence Living Arrangements: Spouse/significant other Available Help at Discharge: Family;Available PRN/intermittently Type of Home: House Home Access: Stairs to enter Entrance Stairs-Rails: None Entrance Stairs-Number of Steps: 1  Home Layout: One level Home Equipment: Grab bars - toilet;Grab bars - tub/shower;Shower seat      Prior Function Prior Level of Function : Independent/Modified Independent                     Hand Dominance   Dominant Hand: Right    Extremity/Trunk Assessment   Upper Extremity Assessment Upper Extremity Assessment: Overall WFL for tasks assessed    Lower Extremity  Assessment Lower Extremity Assessment: Overall WFL for tasks assessed       Communication   Communication: No difficulties  Cognition Arousal/Alertness: Awake/alert Behavior During Therapy: WFL for tasks assessed/performed Overall Cognitive Status: Within Functional Limits for tasks assessed                                          General Comments      Exercises Other Exercises Other Exercises: 5xSTS: 16.85s   Assessment/Plan    PT Assessment Patient needs continued PT services  PT Problem List Decreased balance;Decreased activity tolerance       PT Treatment Interventions Functional mobility training;Gait training;Stair training;Therapeutic activities    PT Goals (Current goals can be found in the Care Plan section)  Acute Rehab PT Goals Patient Stated Goal: to return home PT Goal Formulation: With patient Time For Goal Achievement: 01/22/23 Potential to Achieve Goals: Good    Frequency Min 2X/week     Co-evaluation               AM-PAC PT "6 Clicks" Mobility  Outcome Measure Help needed turning from your back to your side while in a flat bed without using bedrails?: None Help needed moving from lying on your back to sitting on the side of a flat bed without using bedrails?: None Help needed moving to and from a bed to a chair (including a wheelchair)?: A Little Help needed standing up from a chair using your arms (e.g., wheelchair or bedside chair)?: A Little Help needed to walk in hospital room?: A Little Help needed climbing 3-5 steps with a railing? : A Little 6 Click Score: 20    End of Session Equipment Utilized During Treatment: Oxygen Activity Tolerance: Patient tolerated treatment well Patient left: in bed;with bed alarm set;with call bell/phone within reach Nurse Communication: Mobility status PT Visit Diagnosis: Unsteadiness on feet (R26.81)    Time: 1610-9604 PT Time Calculation (min) (ACUTE ONLY): 23 min   Charges:    PT Evaluation $PT Eval Low Complexity: 1 Low PT Treatments $Therapeutic Activity: 8-22 mins        2:46 PM, 01/08/23 Lelar Farewell A. Mordecai Maes PT, DPT Physical Therapist - [facility] A Aamya Orellana 01/08/2023, 2:42 PM

## 2023-01-08 NOTE — ED Notes (Signed)
Pt attempted to use urinal to urinate, only able to empty approx 50cc. Pt states he feels like he needs to urinate, bladder scan shows >900cc in bladder. Will in and out cath now.

## 2023-01-09 DIAGNOSIS — J441 Chronic obstructive pulmonary disease with (acute) exacerbation: Secondary | ICD-10-CM

## 2023-01-09 LAB — BASIC METABOLIC PANEL
Anion gap: 11 (ref 5–15)
BUN: 38 mg/dL — ABNORMAL HIGH (ref 8–23)
CO2: 22 mmol/L (ref 22–32)
Calcium: 8.8 mg/dL — ABNORMAL LOW (ref 8.9–10.3)
Chloride: 103 mmol/L (ref 98–111)
Creatinine, Ser: 2.22 mg/dL — ABNORMAL HIGH (ref 0.61–1.24)
GFR, Estimated: 29 mL/min — ABNORMAL LOW (ref >60)
Glucose, Bld: 229 mg/dL — ABNORMAL HIGH (ref 70–99)
Potassium: 4 mmol/L (ref 3.5–5.1)
Sodium: 136 mmol/L (ref 135–145)

## 2023-01-09 LAB — GLUCOSE, CAPILLARY
Glucose-Capillary: 201 mg/dL — ABNORMAL HIGH (ref 70–99)
Glucose-Capillary: 229 mg/dL — ABNORMAL HIGH (ref 70–99)
Glucose-Capillary: 287 mg/dL — ABNORMAL HIGH (ref 70–99)
Glucose-Capillary: 357 mg/dL — ABNORMAL HIGH (ref 70–99)

## 2023-01-09 LAB — URINE CULTURE

## 2023-01-09 LAB — LACTIC ACID, PLASMA: Lactic Acid, Venous: 2.1 mmol/L (ref 0.5–1.9)

## 2023-01-09 LAB — CBC
HCT: 29.2 % — ABNORMAL LOW (ref 39.0–52.0)
Hemoglobin: 9.8 g/dL — ABNORMAL LOW (ref 13.0–17.0)
MCH: 33 pg (ref 26.0–34.0)
MCHC: 33.6 g/dL (ref 30.0–36.0)
MCV: 98.3 fL (ref 80.0–100.0)
Platelets: 236 10*3/uL (ref 150–400)
RBC: 2.97 MIL/uL — ABNORMAL LOW (ref 4.22–5.81)
RDW: 14.7 % (ref 11.5–15.5)
WBC: 10.9 10*3/uL — ABNORMAL HIGH (ref 4.0–10.5)
nRBC: 0 % (ref 0.0–0.2)

## 2023-01-09 LAB — CULTURE, BLOOD (SINGLE): Special Requests: ADEQUATE

## 2023-01-09 MED ORDER — INSULIN GLARGINE-YFGN 100 UNIT/ML ~~LOC~~ SOLN
45.0000 [IU] | Freq: Every day | SUBCUTANEOUS | Status: DC
  Administered 2023-01-09: 45 [IU] via SUBCUTANEOUS
  Filled 2023-01-09: qty 0.45

## 2023-01-09 MED ORDER — TREPROSTINIL 0.6 MG/ML IN SOLN
18.0000 ug | Freq: Four times a day (QID) | RESPIRATORY_TRACT | Status: DC
  Administered 2023-01-09 – 2023-01-10 (×5): 18 ug via RESPIRATORY_TRACT
  Filled 2023-01-09: qty 2.9

## 2023-01-09 MED ORDER — INSULIN ASPART 100 UNIT/ML IJ SOLN
3.0000 [IU] | Freq: Three times a day (TID) | INTRAMUSCULAR | Status: DC
  Administered 2023-01-09 (×2): 3 [IU] via SUBCUTANEOUS
  Filled 2023-01-09 (×3): qty 1

## 2023-01-09 NOTE — TOC Initial Note (Addendum)
Transition of Care [facility]) - Initial/Assessment Note    Patient Details  Name: Jay Morris MRN: [id number] Date of Birth: [birth year]  Transition [facility] Care [facility]) CM/SW Contact:    Bing Quarry, RN Phone Number: 01/09/2023, 10:35 AM  Clinical Narrative:  6/23: Admitted 01/07/23 with urinary s/s and chronic foley 2/2 phimosis. Was last active with [facility] in January 2024, Amy at [place]  agreed to service patient on discharge, no orders yet, but PT recommendations for [facility] PT, No DME recommended so far. Patient on chronic oxygen 2-4L/[place] at home, which was bumped up to 6L/[place] in ED due to low saturation readings. Currently down to 3L/[place] this am. Has PMH chronic urinary retention, self catheterizes at home 2/2 phimosis per provider notes. Was dx with COPD exacerbation this admit and urinary tract infection per provider notes.  Followed by urology outpatient. TOC to follow for discharge planning.  DPR: Release PHI to (Spouse) Stella Encarnacion [phone] or (Daughter) Oda Kilts [phone].  Rx: [facility] - [place], [place] - [address] [[id number]]  PCP: Dr Osborne Oman  [facility]: Iantha Fallen Ins: Alinda Deem.  ACP documents: None listed Gabriel Cirri RN CM Transitions of Care Department Weekends Only [phone]                          Patient Goals and CMS Choice            Expected Discharge Plan and Services                                              Prior Living Arrangements/Services                       Activities of Daily Living Home Assistive Devices/Equipment: Dan Humphreys (specify type) ADL Screening (condition at time of admission) Patient's cognitive ability adequate to safely complete daily activities?: No Is the patient deaf or have difficulty hearing?: Yes Does the patient have difficulty seeing, even when wearing glasses/contacts?: No Does the patient have difficulty concentrating, remembering, or making decisions?: No Patient able to  express need for assistance with ADLs?: Yes Does the patient have difficulty dressing or bathing?: No Independently performs ADLs?: Yes (appropriate for developmental age) Does the patient have difficulty walking or climbing stairs?: Yes Weakness of Legs: Both Weakness of Arms/Hands: Both  Permission Sought/Granted                  Emotional Assessment              Admission diagnosis:  Acute cystitis without hematuria [N30.00] Acute respiratory failure with hypoxia (HCC) [J96.01] Acute on chronic respiratory failure with hypoxia and hypercapnia (HCC) [V25.36, J96.22] 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 01/07/2023   Complicated urinary tract infection 01/07/2023   Acute on chronic respiratory failure with hypoxia and hypercapnia (HCC) 01/07/2023   Atrial fibrillation (HCC) 07/22/2022   Overweight (BMI 25.0-29.9) 05/28/2022   Acute on chronic respiratory failure with hypoxia (HCC) 05/27/2022   Pneumonia due to COVID-19 virus 05/27/2022   Sepsis due to pneumonia (HCC) 02/13/2022  BPH (benign prostatic hyperplasia) 02/13/2022   Chronic obstructive pulmonary disease (COPD) (HCC) 02/13/2022   GERD without esophagitis 02/13/2022   Anemia in stage 4 chronic kidney disease (HCC) 12/22/2021   GI bleeding 08/03/2021   Chronic diastolic CHF (congestive heart failure) (HCC) 08/03/2021   HTN (hypertension) 08/03/2021   Atrial fibrillation, chronic (HCC) 08/03/2021   CKD (chronic kidney disease), stage IV (HCC) 08/03/2021   CAD (coronary artery disease) 08/03/2021   Leukocytosis 08/03/2021   Gout 01/08/2021   Hearing loss 01/08/2021   History of severe acute respiratory syndrome coronavirus 2 (SARS-CoV-2) disease 01/08/2021    Vitamin D deficiency 01/08/2021   Benign prostatic hyperplasia 12/31/2020   Anemia in chronic kidney disease 12/31/2020   Heart failure, unspecified (HCC) 12/31/2020   CKD (chronic kidney disease) stage 3, GFR 30-59 ml/min (HCC) 01/28/2020   Acute on chronic systolic heart failure (HCC) 01/16/2020   Pulmonary hypertension, moderate to severe (HCC) 11/26/2019   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/17/2019   Anemia, unspecified 09/01/2019   Acute on chronic diastolic CHF (congestive heart failure), NYHA class 3 (HCC) 08/16/2019   PVD (peripheral vascular disease) (HCC) 06/03/2019   Hepatic steatosis 06/03/2019   Acute renal failure superimposed on stage 3a chronic kidney disease (HCC) 06/01/2019   COPD with acute exacerbation (HCC) 06/01/2019   Muscle weakness (generalized) 06/01/2019   COVID-19 virus infection 06/01/2019   AKI (acute kidney injury) (HCC) 05/31/2019   Acute respiratory failure with hypoxia (HCC)    Vitamin B12 deficiency 05/08/2018   Type II diabetes mellitus with renal manifestations (HCC) 02/08/2018   At risk for bleeding 12/15/2017   Hypotension 12/01/2017   Iron deficiency anemia due to chronic blood loss    GI bleed 11/21/2017   Coronary artery disease involving native coronary artery of native heart 11/16/2017   Uncontrolled type 2 diabetes mellitus with hyperglycemia, with long-term current use of insulin (HCC) 11/24/2016   Chronic gouty arthritis 04/16/2016   COPD, moderate (HCC) 02/09/2016   Pneumonia 01/04/2016   Degeneration of lumbar intervertebral disc 10/28/2015   Paroxysmal atrial fibrillation with RVR (HCC) 05/21/2014   Hyperlipidemia 05/21/2014   PCP:  Marguarite Arbour, MD Pharmacy:   [facility] - [place], [place] - [address] [place] [address] Phone: [phone] Fax: [phone]     Social Determinants of Health (SDOH) Social History: SDOH Screenings   Food Insecurity: No Food  Insecurity (01/08/2023)  Housing: Low Risk  (01/08/2023)  Transportation Needs: No Transportation Needs (01/08/2023)  Utilities: Not At Risk (01/08/2023)  Tobacco Use: Medium Risk (01/08/2023)   SDOH Interventions:     Readmission Risk Interventions    08/07/2021    3:03 PM  Readmission Risk Prevention Plan  Transportation Screening Complete  PCP or Specialist Appt within 3-5 Days Complete  HRI or Home Care Consult Complete  Social Work Consult for Recovery Care Planning/Counseling Complete  Palliative Care Screening Not Applicable  Medication Review Oceanographer) Complete

## 2023-01-09 NOTE — Progress Notes (Signed)
PROGRESS NOTE    Clem Wisenbaker  JXB:[id number] DOB: 07-Dec-1945 DOA: 01/07/2023 PCP: Marguarite Arbour, MD  Outpatient Specialists: cardiology, endo, urology, pulm    Brief Narrative:   From admission h and p  Mavin Dyke is a 77 y.o. 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  He has had no abdominal pain, fever or chills, nausea vomiting or diarrhea.  He denies chest pain, lower extremity pain or swelling.  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)   Pulmonary fibrosis (HCC)   Coronary artery disease involving native coronary artery of native heart   Chronic diastolic CHF (congestive heart failure) (HCC)   Atrial fibrillation (HCC)   Long term current use of anticoagulant   CKD (chronic kidney disease), stage IV (HCC)   Anemia in stage 4 chronic kidney disease (HCC)   Type II  diabetes mellitus with renal manifestations (HCC)   HTN (hypertension)   Anemia, unspecified  # UTI, complicated # Phimosis Hx bph, intermittent caths at home (able to urinate some as well), dorsal slit procedure earlier this month, cloudy urine for several days, dysuria for at least one day. No fever. Urinalysis suggestive of infection.   - discussed w/ urology, advise either more frequent cic or foley, needs better bladder decompression, foley placed 6/22, will plan to maintain at d/c, f/u urology as outpt - f/u urine culture (from 6/19 urology visit as well as from the ER on 6/21), 6/19 culture is growing GNRs, hopefully sensitivities will be available tomorrow - cont ceftriaxone - f/u blood culture, ngtd  # COPD with exacerbation # Pulmonary fibrosis Hx copd, pulmonary fibrosis. Nothing focal on CXR but increased dyspnea for one day with non-productive cough, increased o2 requirement from baseline, now weaned down to baseline but still with wheeze - continue ceftriaxone - continue prednisone - continue duonebs  # Acute on chronic hypoxic respiratory failure On 3 liters at home, here requiring 4, in setting of copd exacerbation, now weaned back down to 3 - [place] o2  # A-fib Rate controlled - cont home metop and apixaban  # T2DM Glucose elevated in setting of steroids - increase semglee from 40 to 45 - cont ssi - add mealtime 3 units  # CKD4 Kidney function is at baseline - monitor  # Gout - cont home allopurinol, colchicine  # CAD Hx 3-vessel disease, medically managed.  Normal trop - cont home atorvastatin  # BPH - cont home finasteride, flomax  # HFpEF No pulm edema on CXR, does have some lower extremity edema, bnp elevated to 200s - one-time dose lasix 20 IV on 6/22  # HTN Bp controlled - cont home metop - home lisinopril on hold  # pHTN - cont home sildenafil, tyvaso  # Weakness - pt advising home health    DVT prophylaxis: home apixaban Code  Status: full Family Communication: daughter and wife updated @ bedside 6/23  Level of care: Progressive Status is: Inpatient Remains inpatient appropriate because: severity of illness    Consultants:  none  Procedures: none  Antimicrobials:  Cefepime>ceftriaxone    Subjective: No pain, some chest tightness  Objective: Vitals:   01/08/23 2050 01/08/23 2312 01/09/23 0337 01/09/23 0735  BP:  (!) 129/52 (!) 125/59 (!) 121/59  Pulse:  82 79 72  Resp:  20 18 18   Temp:  97.7 F (36.5 C) 97.8 F (36.6 C) 98.4 F (36.9 C)  TempSrc:      SpO2: 95% 93% 93% 92%  Weight:      Height:        Intake/Output Summary (Last 24 hours) at 01/09/2023 0945 Last data filed at 01/08/2023 2300 Gross per 24 hour  Intake 240 ml  Output 1900 ml  Net -1660 ml   Filed Weights   01/07/23 2023  Weight: 86.6 kg    Examination:  General exam: Appears calm and comfortable  Respiratory system: scattered exp wheeze Cardiovascular system: S1 & S2 heard, rr Gastrointestinal system: Abdomen is obese, soft and nontender. No organomegaly or masses felt. Normal bowel sounds heard. Central nervous system: Alert and oriented. No focal neurological deficits. Extremities: Symmetric 5 x 5 power. GU: pus at meatus Skin: No rashes, lesions or ulcers Psychiatry: Judgement and insight appear normal. Mood & affect appropriate.     Data Reviewed: I have personally reviewed following labs and imaging studies  CBC: Recent Labs  Lab 01/07/23 2028 01/08/23 0648 01/09/23 0605  WBC 10.9* 9.6 10.9*  HGB 9.6* 8.8* 9.8*  HCT 29.6* 28.1* 29.2*  MCV 100.3* 102.6* 98.3  PLT 226 201 236   Basic Metabolic Panel: Recent Labs  Lab 01/07/23 2028 01/08/23 0648 01/09/23 0605  NA 135 140 136  K 3.7 4.0 4.0  CL 105 106 103  CO2 20* 24 22  GLUCOSE 143* 176* 229*  BUN 36* 35* 38*  CREATININE 2.61* 2.47* 2.22*  CALCIUM 8.1* 8.2* 8.8*   GFR: Estimated Creatinine Clearance: 27.9 mL/min (A) (by C-G formula  based on SCr of 2.22 mg/dL (H)). Liver Function Tests: No results for input(s): "AST", "ALT", "ALKPHOS", "BILITOT", "PROT", "ALBUMIN" in the last 168 hours. No results for input(s): "LIPASE", "AMYLASE" in the last 168 hours. No results for input(s): "AMMONIA" in the last 168 hours. Coagulation Profile: No results for input(s): "INR", "PROTIME" in the last 168 hours. Cardiac Enzymes: No results for input(s): "CKTOTAL", "CKMB", "CKMBINDEX", "TROPONINI" in the last 168 hours. BNP (last 3 results) No results for input(s): "PROBNP" in the last 8760 hours. HbA1C: No results for input(s): "HGBA1C" in the last 72 hours. CBG: Recent Labs  Lab 01/08/23 0757 01/08/23 1231 01/08/23 1721 01/08/23 2103 01/09/23 0742  GLUCAP 168* 280* 352* 377* 201*   Lipid Profile: No results for input(s): "CHOL", "HDL", "LDLCALC", "TRIG", "CHOLHDL", "LDLDIRECT" in the last 72 hours. Thyroid Function Tests: No results for input(s): "TSH", "T4TOTAL", "FREET4", "T3FREE", "THYROIDAB" in the last 72 hours. Anemia  Panel: No results for input(s): "VITAMINB12", "FOLATE", "FERRITIN", "TIBC", "IRON", "RETICCTPCT" in the last 72 hours. Urine analysis:    Component Value Date/Time   COLORURINE YELLOW (A) 01/07/2023 2058   APPEARANCEUR TURBID (A) 01/07/2023 2058   APPEARANCEUR Cloudy (A) 01/05/2023 1435   LABSPEC 1.010 01/07/2023 2058   PHURINE 6.0 01/07/2023 2058   GLUCOSEU NEGATIVE 01/07/2023 2058   HGBUR SMALL (A) 01/07/2023 2058   BILIRUBINUR NEGATIVE 01/07/2023 2058   BILIRUBINUR Negative 01/05/2023 1435   KETONESUR NEGATIVE 01/07/2023 2058   PROTEINUR 100 (A) 01/07/2023 2058   NITRITE NEGATIVE 01/07/2023 2058   LEUKOCYTESUR LARGE (A) 01/07/2023 2058   Sepsis Labs: @LABRCNTIP (procalcitonin:4,lacticidven:4)  ) Recent Results (from the past 240 hour(s))  Microscopic Examination     Status: Abnormal   Collection Time: 01/05/23  2:35 PM   Urine  Result Value Ref Range Status   WBC, UA >30 (A) 0 - 5 /hpf  Final   RBC, Urine 3-10 (A) 0 - 2 /hpf Final   Epithelial Cells (non renal) 0-10 0 - 10 /hpf Final   Bacteria, UA Many (A) None seen/Few Final  CULTURE, URINE COMPREHENSIVE     Status: Abnormal (Preliminary result)   Collection Time: 01/05/23  4:08 PM   Specimen: Urine   UR  Result Value Ref Range Status   Urine Culture, Comprehensive Preliminary report (A)  Preliminary   Organism ID, Bacteria Gram negative rods (A)  Preliminary    Comment: Greater than 100,000 colony forming units per mL   Organism ID, Bacteria Comment  Preliminary    Comment: Microbiological testing to rule out the presence of possible pathogens is in progress.   Blood culture (single)     Status: None (Preliminary result)   Collection Time: 01/07/23  8:28 PM   Specimen: BLOOD RIGHT HAND  Result Value Ref Range Status   Specimen Description BLOOD RIGHT HAND  Final   Special Requests   Final    BOTTLES DRAWN AEROBIC AND ANAEROBIC Blood Culture adequate volume   Culture   Final    NO GROWTH 2 DAYS Performed at [facility], [address] [place], [place] [address]    Report Status PENDING  Incomplete  SARS Coronavirus 2 by RT PCR (hospital order, performed in [facility] hospital lab) *cepheid single result test* Anterior Nasal Swab     Status: None   Collection Time: 01/07/23  8:30 PM   Specimen: Anterior Nasal Swab  Result Value Ref Range Status   SARS Coronavirus 2 by RT PCR NEGATIVE NEGATIVE Final    Comment: (NOTE) SARS-CoV-2 target nucleic acids are NOT DETECTED.  The SARS-CoV-2 RNA is generally detectable in upper and lower respiratory specimens during the acute phase of infection. The lowest concentration of SARS-CoV-2 viral copies this assay can detect is 250 copies / mL. A negative result does not preclude SARS-CoV-2 infection and should not be used as the sole basis for treatment or other patient management decisions.  A negative result may occur with improper specimen collection /  handling, submission of specimen other than nasopharyngeal swab, presence of viral mutation(s) within the areas targeted by this assay, and inadequate number of viral copies (<250 copies / mL). A negative result must be combined with clinical observations, patient history, and epidemiological information.  Fact Sheet for Patients:   RoadLapTop.co.za  Fact Sheet for Healthcare Providers: http://kim-miller.com/  This test is not yet approved or  cleared by the [place] FDA and has been authorized for detection and/or diagnosis  of SARS-CoV-2 by FDA under an Emergency Use Authorization (EUA).  This EUA will remain in effect (meaning this test can be used) for the duration of the COVID-19 declaration under Section 564(b)(1) of the Act, 21 U.S.C. section 360bbb-3(b)(1), unless the authorization is terminated or revoked sooner.  Performed at [facility], [address] [place], [place] [address]   Respiratory (~20 pathogens) panel by PCR     Status: None   Collection Time: 01/08/23  2:30 PM   Specimen: Nasopharyngeal Swab; Respiratory  Result Value Ref Range Status   Adenovirus NOT DETECTED NOT DETECTED Final   Coronavirus 229E NOT DETECTED NOT DETECTED Final    Comment: (NOTE) The Coronavirus on the Respiratory Panel, DOES NOT test for the novel  Coronavirus (2019 nCoV)    Coronavirus HKU1 NOT DETECTED NOT DETECTED Final   Coronavirus NL63 NOT DETECTED NOT DETECTED Final   Coronavirus OC43 NOT DETECTED NOT DETECTED Final   Metapneumovirus NOT DETECTED NOT DETECTED Final   Rhinovirus / Enterovirus NOT DETECTED NOT DETECTED Final   Influenza A NOT DETECTED NOT DETECTED Final   Influenza B NOT DETECTED NOT DETECTED Final   Parainfluenza Virus 1 NOT DETECTED NOT DETECTED Final   Parainfluenza Virus 2 NOT DETECTED NOT DETECTED Final   Parainfluenza Virus 3 NOT DETECTED NOT DETECTED Final   Parainfluenza Virus 4 NOT DETECTED  NOT DETECTED Final   Respiratory Syncytial Virus NOT DETECTED NOT DETECTED Final   Bordetella pertussis NOT DETECTED NOT DETECTED Final   Bordetella Parapertussis NOT DETECTED NOT DETECTED Final   Chlamydophila pneumoniae NOT DETECTED NOT DETECTED Final   Mycoplasma pneumoniae NOT DETECTED NOT DETECTED Final    Comment: Performed at [facility], [address], [place], [place] [address]         Radiology Studies: DG Chest Portable 1 View  Result Date: 01/07/2023 CLINICAL DATA:  Dysuria, shortness of breath EXAM: PORTABLE CHEST 1 VIEW COMPARISON:  05/27/2022 FINDINGS: Single frontal view of the chest demonstrates a stable cardiac silhouette. Chronic bilateral parenchymal lung scarring and fibrosis unchanged. No airspace disease, effusion, or pneumothorax. No acute bony abnormalities. IMPRESSION: 1. Chronic pulmonary scarring and fibrosis. No acute airspace disease. Electronically Signed   By: Sharlet Salina M.D.   On: 01/07/2023 20:55        Scheduled Meds:  allopurinol  200 mg Oral Daily   apixaban  5 mg Oral Daily   atorvastatin  20 mg Oral QHS   calcitRIOL  0.25 mcg Oral Daily   Chlorhexidine Gluconate Cloth  6 each Topical Daily   colchicine  0.6 mg Oral Daily   finasteride  5 mg Oral Daily   guaiFENesin  600 mg Oral BID   insulin aspart  0-15 Units Subcutaneous TID WC   insulin aspart  0-5 Units Subcutaneous QHS   insulin glargine-yfgn  40 Units Subcutaneous Q2200   ipratropium-albuterol  3 mL Nebulization TID   magnesium oxide  400 mg Oral TID   metoprolol tartrate  50 mg Oral BID   pantoprazole  40 mg Oral BID AC   predniSONE  40 mg Oral Q breakfast   sildenafil  20 mg Oral TID   tamsulosin  0.4 mg Oral QPC supper   Treprostinil  18 mcg Inhalation QID   Continuous Infusions:  cefTRIAXone (ROCEPHIN)  IV 1 g (01/09/23 0839)     LOS: 2 days     Silvano Bilis, MD [facility]   If 7PM-7AM, please contact night-coverage [email or web address] Password  [email or web address] 01/09/2023, 9:45 AM

## 2023-01-10 LAB — BASIC METABOLIC PANEL
Anion gap: 9 (ref 5–15)
BUN: 48 mg/dL — ABNORMAL HIGH (ref 8–23)
CO2: 25 mmol/L (ref 22–32)
Calcium: 9.2 mg/dL (ref 8.9–10.3)
Chloride: 103 mmol/L (ref 98–111)
Creatinine, Ser: 2.39 mg/dL — ABNORMAL HIGH (ref 0.61–1.24)
GFR, Estimated: 27 mL/min — ABNORMAL LOW (ref >60)
Glucose, Bld: 249 mg/dL — ABNORMAL HIGH (ref 70–99)
Potassium: 4.7 mmol/L (ref 3.5–5.1)
Sodium: 137 mmol/L (ref 135–145)

## 2023-01-10 LAB — GLUCOSE, CAPILLARY: Glucose-Capillary: 198 mg/dL — ABNORMAL HIGH (ref 70–99)

## 2023-01-10 LAB — CBC
HCT: 29.2 % — ABNORMAL LOW (ref 39.0–52.0)
Hemoglobin: 9.4 g/dL — ABNORMAL LOW (ref 13.0–17.0)
MCH: 32 pg (ref 26.0–34.0)
MCHC: 32.2 g/dL (ref 30.0–36.0)
MCV: 99.3 fL (ref 80.0–100.0)
Platelets: 264 10*3/uL (ref 150–400)
RBC: 2.94 MIL/uL — ABNORMAL LOW (ref 4.22–5.81)
RDW: 14.6 % (ref 11.5–15.5)
WBC: 12.1 10*3/uL — ABNORMAL HIGH (ref 4.0–10.5)
nRBC: 0.2 % (ref 0.0–0.2)

## 2023-01-10 LAB — URINE CULTURE: Culture: 10000 — AB

## 2023-01-10 LAB — HEMOGLOBIN A1C
Hgb A1c MFr Bld: 7.5 % — ABNORMAL HIGH (ref 4.8–5.6)
Mean Plasma Glucose: 168.55 mg/dL

## 2023-01-10 MED ORDER — CEPHALEXIN 500 MG PO CAPS
500.0000 mg | ORAL_CAPSULE | Freq: Three times a day (TID) | ORAL | Status: DC
  Administered 2023-01-10: 500 mg via ORAL
  Filled 2023-01-10: qty 1

## 2023-01-10 MED ORDER — CEPHALEXIN 500 MG PO CAPS: 500.0000 mg | ORAL_CAPSULE | Freq: Three times a day (TID) | ORAL | 0 refills | Status: DC

## 2023-01-10 NOTE — Discharge Summary (Signed)
Jay Morris ZOX:[id number] DOB: Sep 18, 1945 DOA: 01/07/2023  PCP: Marguarite Arbour, MD  Admit date: 01/07/2023 Discharge date: 01/10/2023  Time spent: 35 minutes  Recommendations for Outpatient Follow-up:  Close urology f/u     Discharge Diagnoses:  Principal Problem:   COPD with acute exacerbation [facility]) Active Problems:   BPH (benign prostatic hyperplasia)   Complicated urinary tract infection   Phimosis s/p dorsal slit 12/21/22   Intermittent self-catheterization of bladder   Chronic urinary retention secondary to BPH   Acute on chronic respiratory failure with hypoxia (HCC)   Pulmonary hypertension, moderate to severe (HCC)   Pulmonary fibrosis (HCC)   Coronary artery disease involving native coronary artery of native heart   Chronic diastolic CHF (congestive heart failure) (HCC)   Atrial fibrillation (HCC)   Long term current use of anticoagulant   CKD (chronic kidney disease), stage IV (HCC)   Anemia in stage 4 chronic kidney disease (HCC)   Type II diabetes mellitus with renal manifestations (HCC)   HTN (hypertension)   Anemia, unspecified   Discharge Condition: improved  Diet recommendation: heart healthy  Filed Weights   01/07/23 2023  Weight: 86.6 kg    History of present illness:  From admission h and p  Jay Morris is a 77 y.o. male with medical history significant for A-fib on Eliquis, CAD, diastolic CHF, HTN and DM, COPD, pulmonary fibrosis and pulmonary hypertension on home O2 at 3 to 4 L, chronic urinary retention secondary to phimosis s/p dorsal slit on 12/21/2022 and who self catheterizes daily, who presents to the emergency room with concern for urinary tract infection due to a several day history of burning with urination and cloudy urine with self-catheterization.Marland Kitchen  He came in by EMS who found him to be satting at 84% on home oxygen flow rate of 2 L bumping him up to 6 L with a sat of 91% Patient  followed up with urology on 6/19 and the slit was  healing well and they were advised to bring in a catheterized specimen for culture, however due to worsening symptoms they presented to the emergency room.  He has had no abdominal pain, fever or chills, nausea vomiting or diarrhea.  He denies chest pain, lower extremity pain or swelling.  Hospital Course:   # UTI, complicated # Phimosis Hx bph, intermittent caths at home (able to urinate some as well), dorsal slit procedure earlier this month, cloudy urine for several days, dysuria for at least one day. No fever. Klebsiella growing in urine culture - discussed w/ urology, advise either more frequent cic or foley, needs better bladder decompression, foley placed 6/22, will plan to maintain at d/c, f/u urology as outpt - treated with cefepime>ceftriaxone, discharged on keflex based on sensitivities - f/u blood culture, ngtd   # COPD with exacerbation # Pulmonary fibrosis Hx copd, pulmonary fibrosis. Nothing focal on CXR but increased dyspnea for one day with non-productive cough, increased o2 requirement from baseline, now weaned down to baseline and breathing status now at baseline - treated with 3 days ceftriaxone and prednisone   # Acute on chronic hypoxic respiratory failure On 3 liters at home, here requiring 4, in setting of copd exacerbation, now weaned back down to 3 - [place] o2   # A-fib Rate controlled - cont home metop and apixaban   # T2DM Glucose elevated in setting of steroids - resume home meds, no steroids at discharge   # CKD4 Kidney function is at baseline - monitor   #  Gout - cont home allopurinol, colchicine   # CAD Hx 3-vessel disease, medically managed. Normal trop - cont home atorvastatin   # BPH - cont home finasteride, flomax - foley as above   # HFpEF No pulm edema on CXR, does have some lower extremity edema, bnp elevated to 200s - received one-time dose lasix 20 IV on 6/22   # HTN Bp controlled   # pHTN - cont home sildenafil,  tyvaso   Procedures: none   Consultations: none  Discharge Exam: Vitals:   01/10/23 0448 01/10/23 0808  BP: 130/62 130/68  Pulse: 84 69  Resp: 18 16  Temp: 97.8 F (36.6 C) 97.7 F (36.5 C)  SpO2: 96% 96%    General exam: Appears calm and comfortable  Respiratory system: wheeze improved Cardiovascular system: S1 & S2 heard, rr Gastrointestinal system: Abdomen is obese, soft and nontender. No organomegaly or masses felt. Normal bowel sounds heard. Central nervous system: Alert and oriented. No focal neurological deficits. Extremities: Symmetric 5 x 5 power. GU: pus at meatus Skin: No rashes, lesions or ulcers Psychiatry: Judgement and insight appear normal. Mood & affect appropriate.   Discharge Instructions   Discharge Instructions     Diet - low sodium heart healthy   Complete by: As directed    Face-to-face encounter (required for Medicare/Medicaid patients)   Complete by: As directed    I Silvano Bilis certify that this patient is under my care and that I, or a nurse practitioner or physician's assistant working with me, had a face-to-face encounter that meets the physician face-to-face encounter requirements with this patient on 01/10/2023. The encounter with the patient was in whole, or in part for the following medical condition(s) which is the primary reason for home health care (List medical condition): copd   The encounter with the patient was in whole, or in part, for the following medical condition, which is the primary reason for home health care: copd   I certify that, based on my findings, the following services are medically necessary home health services: Physical therapy   Reason for Medically Necessary Home Health Services: Therapy- Therapeutic Exercises to Increase Strength and Endurance   My clinical findings support the need for the above services: Shortness of breath with activity   Further, I certify that my clinical findings support that this patient  is homebound due to: Shortness of Breath with activity   Home Health   Complete by: As directed    To provide the following care/treatments: PT   Increase activity slowly   Complete by: As directed       Allergies as of 01/10/2023       Reactions   Shrimp [shellfish Allergy] Anaphylaxis   Shrimp Extract         Medication List     TAKE these medications    acetaminophen 500 MG tablet Commonly known as: TYLENOL Take 500-1,000 mg by mouth every 6 (six) hours as needed for moderate pain or headache.   allopurinol 100 MG tablet Commonly known as: ZYLOPRIM Take 200 mg by mouth daily.   apixaban 5 MG Tabs tablet Commonly known as: ELIQUIS Take 5 mg by mouth daily.   ascorbic acid 500 MG tablet Commonly known as: VITAMIN C Take 500 mg by mouth daily.   atorvastatin 20 MG tablet Commonly known as: LIPITOR Take 20 mg by mouth at bedtime.   calcitRIOL 0.25 MCG capsule Commonly known as: ROCALTROL Take 0.25 mcg by mouth daily.  cephALEXin 500 MG capsule Commonly known as: KEFLEX Take 1 capsule (500 mg total) by mouth 3 (three) times daily.   colchicine 0.6 MG tablet Take 0.6 mg by mouth daily.   cyanocobalamin 1000 MCG tablet Commonly known as: VITAMIN B12 Take 1,000 mcg by mouth daily.   Dialyvite Vitamin D 5000 125 MCG (5000 UT) capsule Generic drug: Cholecalciferol Take 5,000 Units by mouth daily.   finasteride 5 MG tablet Commonly known as: PROSCAR TAKE 1 TABLET BY MOUTH DAILY   fluticasone furoate-vilanterol 100-25 MCG/ACT Aepb Commonly known as: BREO ELLIPTA Inhale 1 puff into the lungs daily.   furosemide 20 MG tablet Commonly known as: LASIX Take 20 mg by mouth daily.   insulin aspart 100 UNIT/ML FlexPen Commonly known as: NOVOLOG Inject 35-45 Units into the skin 3 (three) times daily with meals. (Based upon blood glucose reading)   lidocaine 5 % ointment Commonly known as: XYLOCAINE Apply 1 Application topically 3 (three) times daily as  needed.   lisinopril 5 MG tablet Commonly known as: ZESTRIL Take 5 mg by mouth daily.   magnesium oxide 400 MG tablet Commonly known as: MAG-OX Take 400 mg by mouth 3 (three) times daily.   metoprolol tartrate 25 MG tablet Commonly known as: LOPRESSOR Take 2 tablets (50 mg total) by mouth 2 (two) times daily.   nystatin-triamcinolone ointment Commonly known as: MYCOLOG Apply 1 Application topically 2 (two) times daily. What changed:  when to take this reasons to take this   pantoprazole 40 MG tablet Commonly known as: PROTONIX Take 1 tablet (40 mg total) by mouth 2 (two) times daily before a meal.   sildenafil 20 MG tablet Commonly known as: REVATIO Take 20 mg by mouth 3 (three) times daily.   tamsulosin 0.4 MG Caps capsule Commonly known as: FLOMAX Take 0.4 mg by mouth daily after supper.   Treprostinil 0.6 MG/ML Soln Commonly known as: TYVASO Inhale 18 mcg into the lungs daily.   Evaristo Bury FlexTouch 200 UNIT/ML FlexTouch Pen Generic drug: insulin degludec Inject 80 Units into the skin at bedtime.       Allergies  Allergen Reactions   Shrimp [Shellfish Allergy] Anaphylaxis   Shrimp Extract     Follow-up Information     Stoioff, Verna Czech, MD Follow up.   Specialty: Urology Why: call to schedule appointment Contact information: [address] [place] [address] [phone]                  The results of significant diagnostics from this hospitalization (including imaging, microbiology, ancillary and laboratory) are listed below for reference.    Significant Diagnostic Studies: DG Chest Portable 1 View  Result Date: 01/07/2023 CLINICAL DATA:  Dysuria, shortness of breath EXAM: PORTABLE CHEST 1 VIEW COMPARISON:  05/27/2022 FINDINGS: Single frontal view of the chest demonstrates a stable cardiac silhouette. Chronic bilateral parenchymal lung scarring and fibrosis unchanged. No airspace disease, effusion, or pneumothorax. No acute  bony abnormalities. IMPRESSION: 1. Chronic pulmonary scarring and fibrosis. No acute airspace disease. Electronically Signed   By: Sharlet Salina M.D.   On: 01/07/2023 20:55    Microbiology: Recent Results (from the past 240 hour(s))  Microscopic Examination     Status: Abnormal   Collection Time: 01/05/23  2:35 PM   Urine  Result Value Ref Range Status   WBC, UA >30 (A) 0 - 5 /hpf Final   RBC, Urine 3-10 (A) 0 - 2 /hpf Final   Epithelial Cells (non renal) 0-10  0 - 10 /hpf Final   Bacteria, UA Many (A) None seen/Few Final  CULTURE, URINE COMPREHENSIVE     Status: Abnormal (Preliminary result)   Collection Time: 01/05/23  4:08 PM   Specimen: Urine   UR  Result Value Ref Range Status   Urine Culture, Comprehensive Preliminary report (A)  Preliminary   Organism ID, Bacteria Gram negative rods (A)  Preliminary    Comment: Greater than 100,000 colony forming units per mL   Organism ID, Bacteria Comment  Preliminary    Comment: Microbiological testing to rule out the presence of possible pathogens is in progress.   Blood culture (single)     Status: None (Preliminary result)   Collection Time: 01/07/23  8:28 PM   Specimen: BLOOD RIGHT HAND  Result Value Ref Range Status   Specimen Description BLOOD RIGHT HAND  Final   Special Requests   Final    BOTTLES DRAWN AEROBIC AND ANAEROBIC Blood Culture adequate volume   Culture   Final    NO GROWTH 3 DAYS Performed at [facility], [address] [place], [place] [address]    Report Status PENDING  Incomplete  SARS Coronavirus 2 by RT PCR (hospital order, performed in [facility] hospital lab) *cepheid single result test* Anterior Nasal Swab     Status: None   Collection Time: 01/07/23  8:30 PM   Specimen: Anterior Nasal Swab  Result Value Ref Range Status   SARS Coronavirus 2 by RT PCR NEGATIVE NEGATIVE Final    Comment: (NOTE) SARS-CoV-2 target nucleic acids are NOT DETECTED.  The SARS-CoV-2 RNA is generally detectable in  upper and lower respiratory specimens during the acute phase of infection. The lowest concentration of SARS-CoV-2 viral copies this assay can detect is 250 copies / mL. A negative result does not preclude SARS-CoV-2 infection and should not be used as the sole basis for treatment or other patient management decisions.  A negative result may occur with improper specimen collection / handling, submission of specimen other than nasopharyngeal swab, presence of viral mutation(s) within the areas targeted by this assay, and inadequate number of viral copies (<250 copies / mL). A negative result must be combined with clinical observations, patient history, and epidemiological information.  Fact Sheet for Patients:   RoadLapTop.co.za  Fact Sheet for Healthcare Providers: http://kim-miller.com/  This test is not yet approved or  cleared by the [place] FDA and has been authorized for detection and/or diagnosis of SARS-CoV-2 by FDA under an Emergency Use Authorization (EUA).  This EUA will remain in effect (meaning this test can be used) for the duration of the COVID-19 declaration under Section 564(b)(1) of the Act, 21 U.S.C. section 360bbb-3(b)(1), unless the authorization is terminated or revoked sooner.  Performed at [facility], [address] [place], [place] [address]   Urine Culture     Status: Abnormal   Collection Time: 01/07/23  8:58 PM   Specimen: Urine, Random  Result Value Ref Range Status   Specimen Description   Final    URINE, RANDOM Performed at [facility], [address], [place], [place] [address]    Special Requests   Final    NONE Reflexed from [phone] Performed at [facility], [address] [place], [place] [address]    Culture 10,000 COLONIES/mL KLEBSIELLA PNEUMONIAE (A)  Final   Report Status 01/10/2023 FINAL  Final   Organism ID, Bacteria KLEBSIELLA PNEUMONIAE (A)  Final       Susceptibility  Klebsiella pneumoniae - MIC*    AMPICILLIN >=32 RESISTANT Resistant     CEFAZOLIN <=4 SENSITIVE Sensitive     CEFEPIME <=0.12 SENSITIVE Sensitive     CEFTRIAXONE <=0.25 SENSITIVE Sensitive     CIPROFLOXACIN <=0.25 SENSITIVE Sensitive     GENTAMICIN <=1 SENSITIVE Sensitive     IMIPENEM <=0.25 SENSITIVE Sensitive     NITROFURANTOIN 32 SENSITIVE Sensitive     TRIMETH/SULFA <=20 SENSITIVE Sensitive     AMPICILLIN/SULBACTAM 8 SENSITIVE Sensitive     PIP/TAZO <=4 SENSITIVE Sensitive     * 10,000 COLONIES/mL KLEBSIELLA PNEUMONIAE  Respiratory (~20 pathogens) panel by PCR     Status: None   Collection Time: 01/08/23  2:30 PM   Specimen: Nasopharyngeal Swab; Respiratory  Result Value Ref Range Status   Adenovirus NOT DETECTED NOT DETECTED Final   Coronavirus 229E NOT DETECTED NOT DETECTED Final    Comment: (NOTE) The Coronavirus on the Respiratory Panel, DOES NOT test for the novel  Coronavirus (2019 nCoV)    Coronavirus HKU1 NOT DETECTED NOT DETECTED Final   Coronavirus NL63 NOT DETECTED NOT DETECTED Final   Coronavirus OC43 NOT DETECTED NOT DETECTED Final   Metapneumovirus NOT DETECTED NOT DETECTED Final   Rhinovirus / Enterovirus NOT DETECTED NOT DETECTED Final   Influenza A NOT DETECTED NOT DETECTED Final   Influenza B NOT DETECTED NOT DETECTED Final   Parainfluenza Virus 1 NOT DETECTED NOT DETECTED Final   Parainfluenza Virus 2 NOT DETECTED NOT DETECTED Final   Parainfluenza Virus 3 NOT DETECTED NOT DETECTED Final   Parainfluenza Virus 4 NOT DETECTED NOT DETECTED Final   Respiratory Syncytial Virus NOT DETECTED NOT DETECTED Final   Bordetella pertussis NOT DETECTED NOT DETECTED Final   Bordetella Parapertussis NOT DETECTED NOT DETECTED Final   Chlamydophila pneumoniae NOT DETECTED NOT DETECTED Final   Mycoplasma pneumoniae NOT DETECTED NOT DETECTED Final    Comment: Performed at [facility], [address] [place], [place] [address]     Labs: Basic  Metabolic Panel: Recent Labs  Lab 01/07/23 2028 01/08/23 0648 01/09/23 0605 01/10/23 0436  NA 135 140 136 137  K 3.7 4.0 4.0 4.7  CL 105 106 103 103  CO2 20* 24 22 25   GLUCOSE 143* 176* 229* 249*  BUN 36* 35* 38* 48*  CREATININE 2.61* 2.47* 2.22* 2.39*  CALCIUM 8.1* 8.2* 8.8* 9.2   Liver Function Tests: No results for input(s): "AST", "ALT", "ALKPHOS", "BILITOT", "PROT", "ALBUMIN" in the last 168 hours. No results for input(s): "LIPASE", "AMYLASE" in the last 168 hours. No results for input(s): "AMMONIA" in the last 168 hours. CBC: Recent Labs  Lab 01/07/23 2028 01/08/23 0648 01/09/23 0605 01/10/23 0436  WBC 10.9* 9.6 10.9* 12.1*  HGB 9.6* 8.8* 9.8* 9.4*  HCT 29.6* 28.1* 29.2* 29.2*  MCV 100.3* 102.6* 98.3 99.3  PLT 226 201 236 264   Cardiac Enzymes: No results for input(s): "CKTOTAL", "CKMB", "CKMBINDEX", "TROPONINI" in the last 168 hours. BNP: BNP (last 3 results) Recent Labs    02/15/22 0608 05/27/22 0608 01/07/23 2027  BNP 485.2* 335.5* 204.4*    ProBNP (last 3 results) No results for input(s): "PROBNP" in the last 8760 hours.  CBG: Recent Labs  Lab 01/09/23 0742 01/09/23 1157 01/09/23 1548 01/09/23 2120 01/10/23 0805  GLUCAP 201* 229* 287* 357* 198*       Signed:  Silvano Bilis MD.  [facility] 01/10/2023, 10:39 AM

## 2023-01-10 NOTE — Discharge Instructions (Signed)
f °

## 2023-01-10 NOTE — Care Management Important Message (Signed)
Important Message  Patient Details  Name: Jay Morris MRN: [id number] Date of Birth: 1946/04/04   Medicare Important Message Given:  N/A - LOS <3 / Initial given by admissions     Johnell Comings 01/10/2023, 9:03 AM

## 2023-01-10 NOTE — Inpatient Diabetes Management (Signed)
Inpatient Diabetes Program Recommendations  AACE/ADA: New Consensus Statement on Inpatient Glycemic Control (2015)  Target Ranges:  Prepandial:   less than 140 mg/dL      Peak postprandial:   less than 180 mg/dL (1-2 hours)      Critically ill patients:  140 - 180 mg/dL   Lab Results  Component Value Date   GLUCAP 198 (H) 01/10/2023   HGBA1C 7.5 (H) 01/07/2023    Review of Glycemic Control  Latest Reference Range & Units 01/09/23 07:42 01/09/23 11:57 01/09/23 15:48 01/09/23 21:20 01/10/23 08:05  Glucose-Capillary 70 - 99 mg/dL 865 (H) 784 (H) 696 (H) 357 (H) 198 (H)  (H): Data is abnormally high  Diabetes history: DM2   Outpatient Diabetes medications:  Tresiba 80 units every day Nvolog 35-45 units TID  Current orders for Inpatient glycemic control:  Semglee 45 units every day Novolog 0-15 units TID and 0-5 units at bedtime Novolog 3 units TID Prednisone 40 mg every day  Inpatient Diabetes Program Recommendations:    Please consider increasing meal coverage:  Novolog 6 units TID with meals if he eats at least 50%  Will continue to follow while inpatient.  Thank you, Dulce Sellar, MSN, CDCES Diabetes Coordinator Inpatient Diabetes Program [phone] (team pager from 8a-5p)

## 2023-01-10 NOTE — TOC Transition Note (Signed)
Transition of Care [facility]) - CM/SW Discharge Note   Patient Details  Name: Jay Morris MRN: [id number] Date of Birth: [birth year]  Transition of [facility] CM/SW Contact:  Margarito Liner, LCSW Phone Number: 01/10/2023, 11:22 AM   Clinical Narrative:   Patient has orders to discharge home today. [facility] liaison is aware. No further concerns. CSW signing off.  Final next level of care: Home w Home Health Services Barriers to Discharge: Barriers Resolved   Patient Goals and CMS Choice      Discharge Placement                      Patient and family notified of of transfer: 01/10/23  Discharge Plan and Services Additional resources added to the After Visit Summary for     Discharge Planning Services: CM Consult Post Acute Care Choice: Home Health          DME Arranged: N/A         HH Arranged: PT HH Agency: [facility] Date [facility] Agency Contacted: 01/10/23 Time HH Agency Contacted: 1109 Representative spoke with at [facility] Agency: Amy Hyatt  Social Determinants of Health (SDOH) Interventions SDOH Screenings   Food Insecurity: No Food Insecurity (01/08/2023)  Housing: Low Risk  (01/08/2023)  Transportation Needs: No Transportation Needs (01/08/2023)  Utilities: Not At Risk (01/08/2023)  Tobacco Use: Medium Risk (01/08/2023)     Readmission Risk Interventions    01/09/2023   11:06 AM 01/09/2023   11:05 AM 08/07/2021    3:03 PM  Readmission Risk Prevention Plan  Transportation Screening Complete  Complete  PCP or Specialist Appt within 3-5 Days  Complete Complete  HRI or Home Care Consult  Complete Complete  Social Work Consult for Recovery Care Planning/Counseling  Complete Complete  Palliative Care Screening  Not Applicable Not Applicable  Medication Review Oceanographer)  Referral to Pharmacy Complete

## 2023-01-12 LAB — CULTURE, BLOOD (SINGLE): Culture: NO GROWTH

## 2023-01-12 LAB — CULTURE, URINE COMPREHENSIVE

## 2023-01-14 LAB — CULTURE, URINE COMPREHENSIVE

## 2023-01-25 ENCOUNTER — Inpatient Hospital Stay: Payer: Medicare HMO

## 2023-01-25 ENCOUNTER — Inpatient Hospital Stay: Payer: Medicare HMO | Admitting: Oncology

## 2023-02-09 ENCOUNTER — Telehealth: Payer: Self-pay

## 2023-02-09 NOTE — Telephone Encounter (Signed)
Pt's physical therapist, April parker, called stating patient was c/o intermittent burning through the catheter. No fever or no cloudy urine. Per Verlin Grills, Leslye Peer was offered to help with bladder spasms. Pt's wife verbalized understanding and states she will stop by the clinic to pick up the gemtesa samples.

## 2023-02-14 ENCOUNTER — Ambulatory Visit: Payer: Medicare HMO | Admitting: Physician Assistant

## 2023-02-14 VITALS — BP 173/69 | HR 66 | Ht 70.0 in | Wt 191.0 lb

## 2023-02-14 DIAGNOSIS — R338 Other retention of urine: Secondary | ICD-10-CM

## 2023-02-14 DIAGNOSIS — N401 Enlarged prostate with lower urinary tract symptoms: Secondary | ICD-10-CM | POA: Diagnosis not present

## 2023-02-14 MED ORDER — SULFAMETHOXAZOLE-TRIMETHOPRIM 800-160 MG PO TABS
1.0000 | ORAL_TABLET | Freq: Once | ORAL | Status: AC
Start: 2023-02-14 — End: 2023-02-14
  Administered 2023-02-14: 1 via ORAL

## 2023-02-14 NOTE — Progress Notes (Signed)
02/14/2023 9:53 AM   Liz Malady [birth year] [id number]  CC: Chief Complaint  Patient presents with   Follow-up    HPI: Jay Morris is a 77 y.o. male with PMH A-fib on Eliquis, pulmonary hypertension, pulmonary fibrosis, COPD on 3 L at baseline, DM 2, CHF, CKD 4, and BPH with urinary retention on finasteride previously managed with CIC once daily performed by his wife who presents today for outpatient Foley removal.  He is accompanied today by his wife, Malachi Bonds, who contributes to HPI.  He was admitted from 01/07/2023 to 01/10/2023 with acute COPD exacerbation.  He was also found to have a complicated UTI and Foley catheter was placed for maximum urinary decompression.  Today he reports he has been doing well.  He would like his catheter removed and to resume CIC.  PMH: Past Medical History:  Diagnosis Date   (HFpEF) heart failure with preserved ejection fraction (HCC)    a.) TTE 04/01/2016: EF 60-65%, LAE, MAC, mild MR/AR/TR, mild-mod PR, G2DD; b.) TTE 12/18/2018: EF 55%, mild LVH, mild AR/MR/TR. mod PR; c.) TTE 05/31/2019: EF 60-65%, mild LVH, RAE/RVE, triv AR.MT, PR, mild TR; d.) TTE 10/26/2019: EF 55%, BAE, RVE, triv AR, mild MR/PR, sev TR, G2DD; e.) R/LHC 01/17/2020: mPA 52, PCWP 25, LVEDP 19, RVEDP 25, mRA 13   Anemia    Aortic atherosclerosis (HCC)    Asthma    BPH (benign prostatic hyperplasia)    CKD (chronic kidney disease), stage III (HCC)    COPD (chronic obstructive pulmonary disease) (HCC)    Coronary artery disease 01/17/2020   a.) LHC 01/17/2020: 25% oLAD, 65% o-pRCA, 45% pLAD, 65% mLAD, 35% OM2 --> med mgmt.   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; c.) A.fib recurrence in setting of SARS-CoV-2 --> Tx'd with IV amiodarone + metoprolol + DCCV x 1 (200 J) 06/22/2019 restoring NSR; d.) rate/rhythm maintained without pharmacological intervention; chronically anticoagulated using standard dose apixaban   Peripheral vascular disease (HCC)    Phimosis of penis    Pneumonia due to COVID-19 virus 06/02/2019   a.) hospitalized at [facility]) from 06/02/2019 - 06/25/2019; Tx'd with steriods + remdesivir + convalescent plasma   Pneumonia due to COVID-19 virus 05/27/2022   a.) hospitalized at [facility] from 05/27/2022 - 05/30/2022 for acute on chronic respiratory failure secondary to SARS-CoV-2 PNA ; Tx'd with IV steroids + remdesivir   Pulmonary fibrosis (HCC)    Pulmonary HTN (HCC) 04/01/2016   a.) TTE 04/01/2016: EF 60-65%, RVSP 40; b.) TTE 12/18/2018: EF 55%, RVSP 45.9; c.) TTE 05/31/2019: EF 60-65%, RVSP 35.5; d.) TTE 10/26/2019: EF 55%, RVSP 100.4; e.) R/LHC 01/17/2020: mPA 52, PCWP 25, LVEDP 19, RVEDP 25, mRA 13; f.) Tx with PDE5i (sildenafil) + vasodilator (treprostinil)   Type 2 diabetes mellitus treated with insulin (HCC)  Surgical History: Past Surgical History:  Procedure Laterality Date   BACK SURGERY     x3 L3-L4    CARDIAC ELECTROPHYSIOLOGY MAPPING AND ABLATION N/A    performed at [facility] in approximately 2014-2015   CARDIOVERSION N/A 06/22/2019   Procedure: CARDIOVERSION;  Surgeon: Pricilla Riffle, MD;  Location: [facility] ENDOSCOPY;  Service: Cardiovascular;  Laterality: N/A;   COLONOSCOPY WITH PROPOFOL N/A 06/15/2017   Procedure: COLONOSCOPY WITH PROPOFOL;  Surgeon: Toledo, Boykin Nearing, MD;  Location: [facility] ENDOSCOPY;  Service:  Gastroenterology;  Laterality: N/A;   DORSAL SLIT N/A 12/21/2022   Procedure: DORSAL SLIT;  Surgeon: Riki Altes, MD;  Location: [facility] ORS;  Service: Urology;  Laterality: N/A;   ENTEROSCOPY N/A 11/24/2017   Procedure: ENTEROSCOPY;  Surgeon: Wyline Mood, MD;  Location: [facility] ENDOSCOPY;  Service: Gastroenterology;  Laterality: N/A;   ESOPHAGOGASTRODUODENOSCOPY N/A 11/27/2017   Procedure: ESOPHAGOGASTRODUODENOSCOPY (EGD);  Surgeon: Midge Minium, MD;  Location: [facility] ENDOSCOPY;  Service: Endoscopy;  Laterality: N/A;   ESOPHAGOGASTRODUODENOSCOPY (EGD) WITH PROPOFOL N/A 06/02/2017   Procedure: ESOPHAGOGASTRODUODENOSCOPY (EGD) WITH PROPOFOL;  Surgeon: Toledo, Boykin Nearing, MD;  Location: [facility] ENDOSCOPY;  Service: Gastroenterology;  Laterality: N/A;   ESOPHAGOGASTRODUODENOSCOPY (EGD) WITH PROPOFOL N/A 08/04/2021   Procedure: ESOPHAGOGASTRODUODENOSCOPY (EGD) WITH PROPOFOL;  Surgeon: Regis Bill, MD;  Location: [facility] ENDOSCOPY;  Service: Endoscopy;  Laterality: N/A;   EVALUATION UNDER ANESTHESIA WITH HEMORRHOIDECTOMY N/A 02/12/2022   Procedure: EXAM UNDER ANESTHESIA WITH HEMORRHOIDECTOMY;  Surgeon: Sung Amabile, DO;  Location: [facility] ORS;  Service: General;  Laterality: N/A;   GIVENS CAPSULE STUDY N/A 11/25/2017   Procedure: GIVENS CAPSULE STUDY;  Surgeon: Wyline Mood, MD;  Location: [facility] ENDOSCOPY;  Service: Gastroenterology;  Laterality: N/A;   HAND SURGERY     RIGHT/LEFT HEART CATH AND CORONARY ANGIOGRAPHY N/A 01/17/2020   Procedure: RIGHT/LEFT HEART CATH AND CORONARY ANGIOGRAPHY;  Surgeon: Lamar Blinks, MD;  Location: [facility] INVASIVE CV LAB;  Service: Cardiovascular;  Laterality: N/A;    Home Medications:  Allergies as of 02/14/2023       Reactions   Shrimp [shellfish Allergy] Anaphylaxis   Shrimp Extract         Medication List        Accurate as of February 14, 2023  9:53 AM. If you have any questions, ask your nurse or doctor.          acetaminophen 500 MG tablet Commonly known  as: TYLENOL Take 500-1,000 mg by mouth every 6 (six) hours as needed for moderate pain or headache.   allopurinol 100 MG tablet Commonly known as: ZYLOPRIM Take 200 mg by mouth daily.   apixaban 5 MG Tabs tablet Commonly known as: ELIQUIS Take 5 mg by mouth daily.   ascorbic acid 500 MG tablet Commonly known as: VITAMIN C Take 500 mg by mouth daily.   atorvastatin 20 MG tablet Commonly known as: LIPITOR Take 20 mg by mouth at bedtime.   calcitRIOL 0.25 MCG capsule Commonly known as: ROCALTROL Take 0.25 mcg by mouth daily.   cephALEXin 500 MG capsule Commonly known as: KEFLEX Take 1 capsule (500 mg total) by mouth 3 (three) times daily.   colchicine 0.6 MG tablet Take 0.6 mg by mouth daily.   cyanocobalamin 1000 MCG tablet Commonly known as: VITAMIN B12 Take 1,000 mcg by mouth daily.   Dialyvite Vitamin D 5000 125 MCG (5000 UT) capsule Generic drug: Cholecalciferol Take 5,000 Units by mouth daily.   finasteride 5 MG tablet Commonly known as: PROSCAR TAKE 1 TABLET BY MOUTH  DAILY   fluticasone furoate-vilanterol 100-25 MCG/ACT Aepb Commonly known as: BREO ELLIPTA Inhale 1 puff into the lungs daily.   furosemide 20 MG tablet Commonly known as: LASIX Take 20 mg by mouth daily.   insulin aspart 100 UNIT/ML FlexPen Commonly known as: NOVOLOG Inject 35-45 Units into the skin 3 (three) times daily with meals. (Based upon blood glucose reading)   lidocaine 5 % ointment Commonly known as: XYLOCAINE Apply 1 Application topically 3 (three) times daily as needed.   lisinopril 5 MG tablet Commonly known as: ZESTRIL Take 5 mg by mouth daily.   magnesium oxide 400 MG tablet Commonly known as: MAG-OX Take 400 mg by mouth 3 (three) times daily.   metoprolol tartrate 25 MG tablet Commonly known as: LOPRESSOR Take 2 tablets (50 mg total) by mouth 2 (two) times daily.   nystatin-triamcinolone ointment Commonly known as: MYCOLOG Apply 1 Application topically 2 (two)  times daily. What changed:  when to take this reasons to take this   pantoprazole 40 MG tablet Commonly known as: PROTONIX Take 1 tablet (40 mg total) by mouth 2 (two) times daily before a meal.   sildenafil 20 MG tablet Commonly known as: REVATIO Take 20 mg by mouth 3 (three) times daily.   tamsulosin 0.4 MG Caps capsule Commonly known as: FLOMAX Take 0.4 mg by mouth daily after supper.   Treprostinil 0.6 MG/ML Soln Commonly known as: TYVASO Inhale 18 mcg into the lungs daily.   Evaristo Bury FlexTouch 200 UNIT/ML FlexTouch Pen Generic drug: insulin degludec Inject 80 Units into the skin at bedtime.        Allergies:  Allergies  Allergen Reactions   Shrimp [Shellfish Allergy] Anaphylaxis   Shrimp Extract     Family History: Family History  Problem Relation Age of Onset   Heart disease Mother    Cancer Father    Cancer Paternal Grandfather     Social History:   reports that he quit smoking about 29 years ago. His smoking use included cigarettes. He started smoking about 59 years ago. He has a 30 pack-year smoking history. He has never used smokeless tobacco. He reports that he does not drink alcohol and does not use drugs.  Physical Exam: BP (!) 173/69   Pulse 66   Ht 5\' 10"  (1.778 m)   Wt 191 lb (86.6 kg)   BMI 27.41 kg/m   Constitutional:  Alert and oriented, no acute distress, nontoxic appearing HEENT: , AT Cardiovascular: No clubbing, cyanosis, or edema Respiratory: Increased work of breathing, on supplemental O2 Skin: No rashes, bruises or suspicious lesions Neurologic: Grossly intact, no focal deficits, moving all 4 extremities Psychiatric: Normal mood and affect  Catheter Removal  Patient is present today for a catheter removal.  8ml of water was drained from the balloon. A 16FR foley cath was removed from the bladder, no complications were noted. Patient tolerated well.  Performed by: Carman Ching, PA-C   Assessment & Plan:    1.  Benign prostatic hyperplasia with urinary retention Patient prefers to resume CIC, but we discussed increasing frequency to twice daily to increase urinary decompression. They are in agreement. Foley removed as above, 1 dose Bactrim DS administered prior to removal for UTI prevention. - sulfamethoxazole-trimethoprim (BACTRIM DS) 800-160 MG per tablet 1 tablet  Return in about 3 months (around 05/17/2023) for CIC follow up.  Carman Ching, PA-C  [facility] [place] [address], [address] [place], [place] [address] [phone]

## 2023-03-04 ENCOUNTER — Other Ambulatory Visit: Payer: Self-pay

## 2023-03-04 DIAGNOSIS — D649 Anemia, unspecified: Secondary | ICD-10-CM

## 2023-03-07 ENCOUNTER — Inpatient Hospital Stay: Payer: Medicare HMO

## 2023-03-07 ENCOUNTER — Encounter: Payer: Self-pay | Admitting: Oncology

## 2023-03-07 ENCOUNTER — Inpatient Hospital Stay: Payer: Medicare HMO | Attending: Nurse Practitioner

## 2023-03-07 ENCOUNTER — Inpatient Hospital Stay: Payer: Medicare HMO | Admitting: Oncology

## 2023-03-07 VITALS — BP 132/55 | HR 62 | Temp 97.8°F | Resp 18 | Ht 70.0 in | Wt 192.4 lb

## 2023-03-07 DIAGNOSIS — D472 Monoclonal gammopathy: Secondary | ICD-10-CM | POA: Diagnosis not present

## 2023-03-07 DIAGNOSIS — N189 Chronic kidney disease, unspecified: Secondary | ICD-10-CM | POA: Insufficient documentation

## 2023-03-07 DIAGNOSIS — Z79899 Other long term (current) drug therapy: Secondary | ICD-10-CM | POA: Insufficient documentation

## 2023-03-07 DIAGNOSIS — I129 Hypertensive chronic kidney disease with stage 1 through stage 4 chronic kidney disease, or unspecified chronic kidney disease: Secondary | ICD-10-CM | POA: Insufficient documentation

## 2023-03-07 DIAGNOSIS — D649 Anemia, unspecified: Secondary | ICD-10-CM

## 2023-03-07 DIAGNOSIS — Z87891 Personal history of nicotine dependence: Secondary | ICD-10-CM | POA: Insufficient documentation

## 2023-03-07 LAB — CBC WITH DIFFERENTIAL (CANCER CENTER ONLY)
Abs Immature Granulocytes: 0.02 10*3/uL (ref 0.00–0.07)
Basophils Absolute: 0.1 10*3/uL (ref 0.0–0.1)
Basophils Relative: 1 %
Eosinophils Absolute: 0.3 10*3/uL (ref 0.0–0.5)
Eosinophils Relative: 4 %
HCT: 30.7 % — ABNORMAL LOW (ref 39.0–52.0)
Hemoglobin: 10 g/dL — ABNORMAL LOW (ref 13.0–17.0)
Immature Granulocytes: 0 %
Lymphocytes Relative: 26 %
Lymphs Abs: 1.9 10*3/uL (ref 0.7–4.0)
MCH: 32.7 pg (ref 26.0–34.0)
MCHC: 32.6 g/dL (ref 30.0–36.0)
MCV: 100.3 fL — ABNORMAL HIGH (ref 80.0–100.0)
Monocytes Absolute: 0.6 10*3/uL (ref 0.1–1.0)
Monocytes Relative: 8 %
Neutro Abs: 4.3 10*3/uL (ref 1.7–7.7)
Neutrophils Relative %: 61 %
Platelet Count: 202 10*3/uL (ref 150–400)
RBC: 3.06 MIL/uL — ABNORMAL LOW (ref 4.22–5.81)
RDW: 14.9 % (ref 11.5–15.5)
WBC Count: 7.1 10*3/uL (ref 4.0–10.5)
nRBC: 0 % (ref 0.0–0.2)

## 2023-03-07 LAB — IRON AND TIBC
Iron: 59 ug/dL (ref 45–182)
Saturation Ratios: 17 % — ABNORMAL LOW (ref 17.9–39.5)
TIBC: 349 ug/dL (ref 250–450)
UIBC: 290 ug/dL

## 2023-03-07 LAB — FERRITIN: Ferritin: 142 ng/mL (ref 24–336)

## 2023-03-07 NOTE — Progress Notes (Signed)
[facility]  [phone] [phone]  ID: Jay Morris OB: Jun 15, 1946  MR#: [id number]  GMW#:[id number]  Patient Care Team: Marguarite Arbour, MD as PCP - General (Internal Medicine) Jeralyn Ruths, MD as Consulting Physician (Oncology)  CHIEF COMPLAINT: Anemia, unspecified.  INTERVAL HISTORY: Patient returns to clinic today for repeat laboratory work, further evaluation, and consideration of additional Aranesp.  He continues to feel well and remains asymptomatic.  He does not complain of weakness or fatigue today.  He has no neurologic complaints.  He denies any recent fevers or illnesses.  He has a good appetite and denies weight loss.  He has chronic shortness of breath requiring oxygen, but denies any chest pain, cough, or hemoptysis.  He denies any nausea, vomiting, constipation, or diarrhea.  He has no melena or hematochezia.  He has no urinary complaints.  Patient offers no further specific complaints today.  REVIEW OF SYSTEMS:   Review of Systems  Constitutional: Negative.  Negative for fever, malaise/fatigue and weight loss.  Respiratory:  Positive for shortness of breath. Negative for cough and hemoptysis.   Cardiovascular: Negative.  Negative for chest pain and leg swelling.  Gastrointestinal: Negative.  Negative for abdominal pain, blood in stool and melena.  Genitourinary: Negative.  Negative for hematuria.  Musculoskeletal: Negative.  Negative for back pain.  Skin: Negative.  Negative for rash.  Neurological: Negative.  Negative for dizziness, focal weakness, weakness and headaches.  Psychiatric/Behavioral: Negative.  The patient is not nervous/anxious.     As per HPI. Otherwise, a complete review of systems is negative.  PAST MEDICAL HISTORY: Past Medical History:  Diagnosis Date   (HFpEF) heart failure with preserved ejection fraction (HCC)    a.) TTE 04/01/2016: EF 60-65%, LAE, MAC, mild MR/AR/TR, mild-mod PR, G2DD; b.)  TTE 12/18/2018: EF 55%, mild LVH, mild AR/MR/TR. mod PR; c.) TTE 05/31/2019: EF 60-65%, mild LVH, RAE/RVE, triv AR.MT, PR, mild TR; d.) TTE 10/26/2019: EF 55%, BAE, RVE, triv AR, mild MR/PR, sev TR, G2DD; e.) R/LHC 01/17/2020: mPA 52, PCWP 25, LVEDP 19, RVEDP 25, mRA 13   Anemia    Aortic atherosclerosis (HCC)    Asthma    BPH (benign prostatic hyperplasia)    CKD (chronic kidney disease), stage III (HCC)    COPD (chronic obstructive pulmonary disease) (HCC)    Coronary artery disease 01/17/2020   a.) LHC 01/17/2020: 25% oLAD, 65% o-pRCA, 45% pLAD, 65% mLAD, 35% OM2 --> med mgmt.   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   PAF (paroxysmal atrial fibrillation) (HCC)    a.) CHA2DS2-VASc = 6 (age x 2, HFpEF, HTN, vascular disease, T2DM);  b.) cardiac ablation in approx 2014-2015 at [facility]; c.) A.fib recurrence in setting of SARS-CoV-2 --> Tx'd with IV amiodarone + metoprolol + DCCV x 1 (200 J) 06/22/2019 restoring NSR; d.) rate/rhythm maintained without pharmacological intervention; chronically anticoagulated using standard dose apixaban   Peripheral vascular disease (HCC)    Phimosis of penis    Pneumonia due to COVID-19 virus  06/02/2019   a.) hospitalized at [facility]) from 06/02/2019 - 06/25/2019; Tx'd with steriods + remdesivir + convalescent plasma   Pneumonia due to COVID-19 virus 05/27/2022   a.) hospitalized at [facility] from 05/27/2022 - 05/30/2022 for acute on chronic respiratory failure secondary to SARS-CoV-2 PNA ; Tx'd with IV steroids +  remdesivir   Pulmonary fibrosis (HCC)    Pulmonary HTN (HCC) 04/01/2016   a.) TTE 04/01/2016: EF 60-65%, RVSP 40; b.) TTE 12/18/2018: EF 55%, RVSP 45.9; c.) TTE 05/31/2019: EF 60-65%, RVSP 35.5; d.) TTE 10/26/2019: EF 55%, RVSP 100.4; e.) R/LHC 01/17/2020: mPA 52, PCWP 25, LVEDP 19, RVEDP 25, mRA 13; f.) Tx with PDE5i (sildenafil) + vasodilator (treprostinil)   Type 2 diabetes mellitus treated with insulin (HCC)     PAST SURGICAL HISTORY: Past Surgical History:  Procedure Laterality Date   BACK SURGERY     x3 L3-L4    CARDIAC ELECTROPHYSIOLOGY MAPPING AND ABLATION N/A    performed at [facility] in approximately 2014-2015   CARDIOVERSION N/A 06/22/2019   Procedure: CARDIOVERSION;  Surgeon: Pricilla Riffle, MD;  Location: [facility] ENDOSCOPY;  Service: Cardiovascular;  Laterality: N/A;   COLONOSCOPY WITH PROPOFOL N/A 06/15/2017   Procedure: COLONOSCOPY WITH PROPOFOL;  Surgeon: Toledo, Boykin Nearing, MD;  Location: [facility] ENDOSCOPY;  Service: Gastroenterology;  Laterality: N/A;   DORSAL SLIT N/A 12/21/2022   Procedure: DORSAL SLIT;  Surgeon: Riki Altes, MD;  Location: [facility] ORS;  Service: Urology;  Laterality: N/A;   ENTEROSCOPY N/A 11/24/2017   Procedure: ENTEROSCOPY;  Surgeon: Wyline Mood, MD;  Location: [facility] ENDOSCOPY;  Service: Gastroenterology;  Laterality: N/A;   ESOPHAGOGASTRODUODENOSCOPY N/A 11/27/2017   Procedure: ESOPHAGOGASTRODUODENOSCOPY (EGD);  Surgeon: Midge Minium, MD;  Location: [facility] ENDOSCOPY;  Service: Endoscopy;  Laterality: N/A;   ESOPHAGOGASTRODUODENOSCOPY (EGD) WITH PROPOFOL N/A 06/02/2017   Procedure: ESOPHAGOGASTRODUODENOSCOPY (EGD) WITH PROPOFOL;  Surgeon: Toledo, Boykin Nearing, MD;  Location: [facility] ENDOSCOPY;  Service: Gastroenterology;  Laterality: N/A;   ESOPHAGOGASTRODUODENOSCOPY (EGD) WITH PROPOFOL N/A 08/04/2021   Procedure: ESOPHAGOGASTRODUODENOSCOPY (EGD) WITH PROPOFOL;  Surgeon: Regis Bill, MD;  Location: [facility] ENDOSCOPY;  Service: Endoscopy;  Laterality: N/A;   EVALUATION  UNDER ANESTHESIA WITH HEMORRHOIDECTOMY N/A 02/12/2022   Procedure: EXAM UNDER ANESTHESIA WITH HEMORRHOIDECTOMY;  Surgeon: Sung Amabile, DO;  Location: [facility] ORS;  Service: General;  Laterality: N/A;   GIVENS CAPSULE STUDY N/A 11/25/2017   Procedure: GIVENS CAPSULE STUDY;  Surgeon: Wyline Mood, MD;  Location: [facility] ENDOSCOPY;  Service: Gastroenterology;  Laterality: N/A;   HAND SURGERY     RIGHT/LEFT HEART CATH AND CORONARY ANGIOGRAPHY N/A 01/17/2020   Procedure: RIGHT/LEFT HEART CATH AND CORONARY ANGIOGRAPHY;  Surgeon: Lamar Blinks, MD;  Location: [facility] INVASIVE CV LAB;  Service: Cardiovascular;  Laterality: N/A;    FAMILY HISTORY: Family History  Problem Relation Age of Onset   Heart disease Mother    Cancer Father    Cancer Paternal Grandfather     ADVANCED DIRECTIVES (Y/N):  N  HEALTH MAINTENANCE: Social History   Tobacco Use   Smoking status: Former    Current packs/day: 0.00    Average packs/day: 1 pack/day for 30.0 years (30.0 ttl pk-yrs)    Types: Cigarettes    Start date: 6    Quit date: 1995    Years since quitting: 29.6   Smokeless tobacco: Never  Vaping Use   Vaping status: Never Used  Substance Use Topics   Alcohol use: No    Alcohol/week: 0.0 standard drinks of alcohol  Comment: occasional   Drug use: No     Colonoscopy:  PAP:  Bone density:  Lipid panel:  Allergies  Allergen Reactions   Shrimp [Shellfish Allergy] Anaphylaxis   Shrimp Extract     Current Outpatient Medications  Medication Sig Dispense Refill   acetaminophen (TYLENOL) 500 MG tablet Take 500-1,000 mg by mouth every 6 (six) hours as needed for moderate pain or headache. 30 tablet 0   allopurinol (ZYLOPRIM) 100 MG tablet Take 200 mg by mouth daily.     apixaban (ELIQUIS) 5 MG TABS tablet Take 5 mg by mouth daily.     ascorbic acid (VITAMIN C) 500 MG tablet Take 500 mg by mouth daily.     atorvastatin (LIPITOR) 20 MG tablet Take 20 mg by mouth at bedtime.      calcitRIOL  (ROCALTROL) 0.25 MCG capsule Take 0.25 mcg by mouth daily.     Cholecalciferol (DIALYVITE VITAMIN D 5000) 125 MCG (5000 UT) capsule Take 5,000 Units by mouth daily.     colchicine 0.6 MG tablet Take 0.6 mg by mouth daily.     finasteride (PROSCAR) 5 MG tablet TAKE 1 TABLET BY MOUTH DAILY (Patient taking differently: Take 5 mg by mouth daily.) 30 tablet 11   fluticasone furoate-vilanterol (BREO ELLIPTA) 100-25 MCG/ACT AEPB Inhale 1 puff into the lungs daily.     furosemide (LASIX) 20 MG tablet Take 20 mg by mouth daily.     insulin aspart (NOVOLOG) 100 UNIT/ML FlexPen Inject 35-45 Units into the skin 3 (three) times daily with meals. (Based upon blood glucose reading)     insulin degludec (TRESIBA FLEXTOUCH) 200 UNIT/ML FlexTouch Pen Inject 80 Units into the skin at bedtime.     lisinopril (ZESTRIL) 5 MG tablet Take 5 mg by mouth daily.     magnesium oxide (MAG-OX) 400 MG tablet Take 400 mg by mouth 3 (three) times daily.     metoprolol tartrate (LOPRESSOR) 25 MG tablet Take 2 tablets (50 mg total) by mouth 2 (two) times daily.     nystatin-triamcinolone ointment (MYCOLOG) Apply 1 Application topically 2 (two) times daily. (Patient taking differently: Apply 1 Application topically as needed.) 30 g 2   pantoprazole (PROTONIX) 40 MG tablet Take 1 tablet (40 mg total) by mouth 2 (two) times daily before a meal. 60 tablet 0   sildenafil (REVATIO) 20 MG tablet Take 20 mg by mouth 3 (three) times daily.     tamsulosin (FLOMAX) 0.4 MG CAPS capsule Take 0.4 mg by mouth daily after supper.     Treprostinil (TYVASO) 0.6 MG/ML SOLN Inhale 18 mcg into the lungs daily.     vitamin B-12 (CYANOCOBALAMIN) 1000 MCG tablet Take 1,000 mcg by mouth daily.     lidocaine (XYLOCAINE) 5 % ointment Apply 1 Application topically 3 (three) times daily as needed. (Patient not taking: Reported on 03/07/2023) 35 g 0   No current facility-administered medications for this visit.    OBJECTIVE: Vitals:   03/07/23 1317  BP:  (!) 132/55  Pulse: 62  Resp: 18  Temp: 97.8 F (36.6 C)  SpO2: 98%      Body mass index is 27.61 kg/m.    ECOG FS:1 - Symptomatic but completely ambulatory  General: Well-developed, well-nourished, no acute distress.  Sitting in wheelchair. Eyes: Pink conjunctiva, anicteric sclera. HEENT: Normocephalic, moist mucous membranes. Lungs: No audible wheezing or coughing. Heart: Regular rate and rhythm. Abdomen: Soft, nontender, no obvious distention. Musculoskeletal: No edema, cyanosis, or clubbing. Neuro: Alert, answering all  questions appropriately. Cranial nerves grossly intact. Skin: No rashes or petechiae noted. Psych: Normal affect.  LAB RESULTS:  Lab Results  Component Value Date   NA 137 01/10/2023   K 4.7 01/10/2023   CL 103 01/10/2023   CO2 25 01/10/2023   GLUCOSE 249 (H) 01/10/2023   BUN 48 (H) 01/10/2023   CREATININE 2.39 (H) 01/10/2023   CALCIUM 9.2 01/10/2023   PROT 6.5 05/30/2022   ALBUMIN 3.7 05/30/2022   AST 45 (H) 05/30/2022   ALT 68 (H) 05/30/2022   ALKPHOS 31 (L) 05/30/2022   BILITOT 0.7 05/30/2022   GFRNONAA 27 (L) 01/10/2023   GFRAA 39 (L) 10/18/2019    Lab Results  Component Value Date   WBC 7.1 03/07/2023   NEUTROABS 4.3 03/07/2023   HGB 10.0 (L) 03/07/2023   HCT 30.7 (L) 03/07/2023   MCV 100.3 (H) 03/07/2023   PLT 202 03/07/2023   Lab Results  Component Value Date   IRON 71 10/21/2022   TIBC 346 10/21/2022   IRONPCTSAT 21 10/21/2022   Lab Results  Component Value Date   FERRITIN 233 10/21/2022     STUDIES: No results found.   ASSESSMENT: Anemia, unspecified.  PLAN:   Anemia, unspecified: Bone marrow biopsy completed on June 25, 2020 was essentially negative only with a hypercellular marrow for age.  Patient was noted to have decreased iron stores and rare ringed sideroblasts.  FISH and cytogenetics were negative.  He received Venofer x 5 with his most recent treatment on May 06, 2022.  Repeat iron stores are pending at  time of dictation. Previously, repeat B12, folate, and hemolysis labs were found to be either negative or within normal limits.  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  Patient's hemoglobin is 10.0 today and he does not wish to proceed with Aranesp.  No intervention is needed.  Return to clinic in 4 months with repeat laboratory, further evaluation, and continuation of treatment if needed.  MGUS: Patient previously noted to have an M spike of 0.3, but repeat laboratory work in October 2023 this was no longer observed.  Both kappa and lambda free light chains are only mildly elevated and immunoglobulins are within normal limits.  Bone marrow biopsy only revealed approximately 1% plasma cells.  Likely clinically insignificant. Chronic renal insufficiency: Chronic and unchanged.  Patient's most recent creatinine is 2.39. Continue follow-up with nephrology as indicated. Hypertension: Mild.  Continue monitoring and treatment per primary care.  Patient expressed understanding and was in agreement with this plan. He also understands that He can call clinic at any time with any questions, concerns, or complaints.    Jeralyn Ruths, MD   03/07/2023 1:49 PM

## 2023-05-16 ENCOUNTER — Ambulatory Visit (INDEPENDENT_AMBULATORY_CARE_PROVIDER_SITE_OTHER): Payer: Medicare HMO | Admitting: Physician Assistant

## 2023-05-16 ENCOUNTER — Encounter: Payer: Self-pay | Admitting: Physician Assistant

## 2023-05-16 VITALS — BP 157/67 | HR 59 | Ht 70.0 in | Wt 191.0 lb

## 2023-05-16 DIAGNOSIS — K649 Unspecified hemorrhoids: Secondary | ICD-10-CM | POA: Diagnosis not present

## 2023-05-16 DIAGNOSIS — N401 Enlarged prostate with lower urinary tract symptoms: Secondary | ICD-10-CM | POA: Diagnosis not present

## 2023-05-16 DIAGNOSIS — R338 Other retention of urine: Secondary | ICD-10-CM

## 2023-05-16 MED ORDER — LIDOCAINE 5 % EX OINT
1.0000 | TOPICAL_OINTMENT | Freq: Three times a day (TID) | CUTANEOUS | 0 refills | Status: AC | PRN
Start: 2023-05-16 — End: ?

## 2023-05-16 NOTE — Progress Notes (Signed)
05/16/2023 10:14 AM   Jay Morris 09-25-1945 [id number]  CC: Chief Complaint  Patient presents with   Follow-up    CIC   HPI: Jay Morris is a 77 y.o. male with PMH A-fib on Eliquis, pulmonary hypertension, pulmonary fibrosis, COPD on 3 L at baseline, DM 2, CHF, CKD 4, and BPH with urinary retention on finasteride who augments with CIC who presents today for follow-up.   Today he reports his wife was performing CIC twice daily for a while after his last office visit with me 3 months ago, however they eventually went back to once daily CIC.  He continues to spontaneously void and sometimes he caths himself.  They have been doing very well with this regimen and report no UTIs.  PMH: Past Medical History:  Diagnosis Date   (HFpEF) heart failure with preserved ejection fraction (HCC)    a.) TTE 04/01/2016: EF 60-65%, LAE, MAC, mild MR/AR/TR, mild-mod PR, G2DD; b.) TTE 12/18/2018: EF 55%, mild LVH, mild AR/MR/TR. mod PR; c.) TTE 05/31/2019: EF 60-65%, mild LVH, RAE/RVE, triv AR.MT, PR, mild TR; d.) TTE 10/26/2019: EF 55%, BAE, RVE, triv AR, mild MR/PR, sev TR, G2DD; e.) R/LHC 01/17/2020: mPA 52, PCWP 25, LVEDP 19, RVEDP 25, mRA 13   Anemia    Aortic atherosclerosis (HCC)    Asthma    BPH (benign prostatic hyperplasia)    CKD (chronic kidney disease), stage III (HCC)    COPD (chronic obstructive pulmonary disease) (HCC)    Coronary artery disease 01/17/2020   a.) LHC 01/17/2020: 25% oLAD, 65% o-pRCA, 45% pLAD, 65% mLAD, 35% OM2 --> med mgmt.   DDD (degenerative disc disease), lumbar    Dyspnea    GERD (gastroesophageal reflux disease)    GI bleed 11/2017   Gout    Helicobacter pylori gastritis    Hemorrhoids    History of 2019 novel coronavirus disease (COVID-19)    a.) 05/30/2019; b.) 05/27/2022   History of asbestos exposure    Hyperlipidemia    Hypertension    Long term current use of anticoagulant    a.) apixaban   MGUS (monoclonal gammopathy of unknown  significance) 09/03/2019   PAF (paroxysmal atrial fibrillation) (HCC)    a.) CHA2DS2-VASc = 6 (age x 2, HFpEF, HTN, vascular disease, T2DM);  b.) cardiac ablation in approx 2014-2015 at [facility]; c.) A.fib recurrence in setting of SARS-CoV-2 --> Tx'd with IV amiodarone + metoprolol + DCCV x 1 (200 J) 06/22/2019 restoring NSR; d.) rate/rhythm maintained without pharmacological intervention; chronically anticoagulated using standard dose apixaban   Peripheral vascular disease (HCC)    Phimosis of penis    Pneumonia due to COVID-19 virus 06/02/2019   a.) hospitalized at [facility]) from 06/02/2019 - 06/25/2019; Tx'd with steriods + remdesivir + convalescent plasma   Pneumonia due to COVID-19 virus 05/27/2022   a.) hospitalized at [facility] from 05/27/2022 - 05/30/2022 for acute on chronic respiratory failure secondary to SARS-CoV-2 PNA ; Tx'd with IV steroids + remdesivir   Pulmonary fibrosis (HCC)    Pulmonary HTN (HCC) 04/01/2016   a.) TTE 04/01/2016: EF 60-65%, RVSP 40; b.) TTE 12/18/2018: EF 55%, RVSP 45.9; c.) TTE 05/31/2019: EF 60-65%, RVSP 35.5; d.) TTE 10/26/2019: EF 55%, RVSP 100.4; e.) R/LHC 01/17/2020: mPA 52, PCWP 25, LVEDP 19, RVEDP 25, mRA 13; f.) Tx with PDE5i (sildenafil) + vasodilator (treprostinil)   Type 2 diabetes mellitus treated with insulin [facility])     Surgical History: Past Surgical History:  Procedure  Laterality Date   BACK SURGERY     x3 L3-L4    CARDIAC ELECTROPHYSIOLOGY MAPPING AND ABLATION N/A    performed at [facility] in approximately 2014-2015   CARDIOVERSION N/A 06/22/2019   Procedure: CARDIOVERSION;  Surgeon: Pricilla Riffle, MD;  Location: [facility] ENDOSCOPY;  Service: Cardiovascular;  Laterality: N/A;   COLONOSCOPY WITH PROPOFOL N/A 06/15/2017   Procedure: COLONOSCOPY WITH PROPOFOL;  Surgeon: Toledo, Boykin Nearing, MD;  Location: [facility] ENDOSCOPY;  Service: Gastroenterology;  Laterality: N/A;   DORSAL SLIT N/A 12/21/2022   Procedure: DORSAL SLIT;   Surgeon: Riki Altes, MD;  Location: [facility] ORS;  Service: Urology;  Laterality: N/A;   ENTEROSCOPY N/A 11/24/2017   Procedure: ENTEROSCOPY;  Surgeon: Wyline Mood, MD;  Location: [facility] ENDOSCOPY;  Service: Gastroenterology;  Laterality: N/A;   ESOPHAGOGASTRODUODENOSCOPY N/A 11/27/2017   Procedure: ESOPHAGOGASTRODUODENOSCOPY (EGD);  Surgeon: Midge Minium, MD;  Location: [facility] ENDOSCOPY;  Service: Endoscopy;  Laterality: N/A;   ESOPHAGOGASTRODUODENOSCOPY (EGD) WITH PROPOFOL N/A 06/02/2017   Procedure: ESOPHAGOGASTRODUODENOSCOPY (EGD) WITH PROPOFOL;  Surgeon: Toledo, Boykin Nearing, MD;  Location: [facility] ENDOSCOPY;  Service: Gastroenterology;  Laterality: N/A;   ESOPHAGOGASTRODUODENOSCOPY (EGD) WITH PROPOFOL N/A 08/04/2021   Procedure: ESOPHAGOGASTRODUODENOSCOPY (EGD) WITH PROPOFOL;  Surgeon: Regis Bill, MD;  Location: [facility] ENDOSCOPY;  Service: Endoscopy;  Laterality: N/A;   EVALUATION UNDER ANESTHESIA WITH HEMORRHOIDECTOMY N/A 02/12/2022   Procedure: EXAM UNDER ANESTHESIA WITH HEMORRHOIDECTOMY;  Surgeon: Sung Amabile, DO;  Location: [facility] ORS;  Service: General;  Laterality: N/A;   GIVENS CAPSULE STUDY N/A 11/25/2017   Procedure: GIVENS CAPSULE STUDY;  Surgeon: Wyline Mood, MD;  Location: [facility] ENDOSCOPY;  Service: Gastroenterology;  Laterality: N/A;   HAND SURGERY     RIGHT/LEFT HEART CATH AND CORONARY ANGIOGRAPHY N/A 01/17/2020   Procedure: RIGHT/LEFT HEART CATH AND CORONARY ANGIOGRAPHY;  Surgeon: Lamar Blinks, MD;  Location: [facility] INVASIVE CV LAB;  Service: Cardiovascular;  Laterality: N/A;    Home Medications:  Allergies as of 05/16/2023       Reactions   Shrimp [shellfish Allergy] Anaphylaxis   Shrimp Extract         Medication List        Accurate as of May 16, 2023 10:14 AM. If you have any questions, ask your nurse or doctor.          acetaminophen 500 MG tablet Commonly known as: TYLENOL Take 500-1,000 mg by mouth every 6 (six) hours as needed for moderate pain  or headache.   allopurinol 100 MG tablet Commonly known as: ZYLOPRIM Take 200 mg by mouth daily.   apixaban 5 MG Tabs tablet Commonly known as: ELIQUIS Take 5 mg by mouth daily.   ascorbic acid 500 MG tablet Commonly known as: VITAMIN C Take 500 mg by mouth daily.   atorvastatin 20 MG tablet Commonly known as: LIPITOR Take 20 mg by mouth at bedtime.   calcitRIOL 0.25 MCG capsule Commonly known as: ROCALTROL Take 0.25 mcg by mouth daily.   colchicine 0.6 MG tablet Take 0.6 mg by mouth daily.   cyanocobalamin 1000 MCG tablet Commonly known as: VITAMIN B12 Take 1,000 mcg by mouth daily.   Dialyvite Vitamin D 5000 125 MCG (5000 UT) capsule Generic drug: Cholecalciferol Take 5,000 Units by mouth daily.   finasteride 5 MG tablet Commonly known as: PROSCAR TAKE 1 TABLET BY MOUTH DAILY   fluticasone furoate-vilanterol 100-25 MCG/ACT Aepb Commonly known as: BREO ELLIPTA Inhale 1 puff into the lungs daily.   furosemide 20 MG tablet Commonly known as: LASIX  Take 20 mg by mouth daily.   insulin aspart 100 UNIT/ML FlexPen Commonly known as: NOVOLOG Inject 35-45 Units into the skin 3 (three) times daily with meals. (Based upon blood glucose reading)   lidocaine 5 % ointment Commonly known as: XYLOCAINE Apply 1 Application topically 3 (three) times daily as needed.   lisinopril 5 MG tablet Commonly known as: ZESTRIL Take 5 mg by mouth daily.   magnesium oxide 400 MG tablet Commonly known as: MAG-OX Take 400 mg by mouth 3 (three) times daily.   metoprolol tartrate 50 MG tablet Commonly known as: LOPRESSOR Take 50 mg by mouth 2 (two) times daily. What changed: Another medication with the same name was removed. Continue taking this medication, and follow the directions you see here. Changed by: Carman Ching   nystatin-triamcinolone ointment Commonly known as: MYCOLOG Apply 1 Application topically 2 (two) times daily. What changed:  when to take  this reasons to take this   pantoprazole 40 MG tablet Commonly known as: PROTONIX Take 1 tablet (40 mg total) by mouth 2 (two) times daily before a meal.   sildenafil 20 MG tablet Commonly known as: REVATIO Take 20 mg by mouth 3 (three) times daily.   tamsulosin 0.4 MG Caps capsule Commonly known as: FLOMAX Take 0.4 mg by mouth daily after supper.   Treprostinil 0.6 MG/ML Soln Commonly known as: TYVASO Inhale 18 mcg into the lungs daily.   Evaristo Bury FlexTouch 200 UNIT/ML FlexTouch Pen Generic drug: insulin degludec Inject 80 Units into the skin at bedtime.        Allergies:  Allergies  Allergen Reactions   Shrimp [Shellfish Allergy] Anaphylaxis   Shrimp Extract     Family History: Family History  Problem Relation Age of Onset   Heart disease Mother    Cancer Father    Cancer Paternal Grandfather     Social History:   reports that he quit smoking about 29 years ago. His smoking use included cigarettes. He started smoking about 59 years ago. He has a 30 pack-year smoking history. He has never used smokeless tobacco. He reports that he does not drink alcohol and does not use drugs.  Physical Exam: BP (!) 157/67 (BP Location: Right Arm, Patient Position: Sitting, Cuff Size: Normal)   Pulse (!) 59   Ht 5\' 10"  (1.778 m)   Wt 191 lb (86.6 kg)   BMI 27.41 kg/m   Constitutional:  Alert and oriented, no acute distress, nontoxic appearing HEENT: [place], AT Cardiovascular: No clubbing, cyanosis, or edema Respiratory: On supplemental O2 Skin: No rashes, bruises or suspicious lesions Neurologic: Grossly intact, no focal deficits, moving all 4 extremities Psychiatric: Normal mood and affect  Assessment & Plan:   1. Benign prostatic hyperplasia with urinary retention Well-managed with spontaneous voids and augmentary CIC x 1 daily.  Will continue this regimen.  2. Hemorrhoids, unspecified hemorrhoid type Refilling per patient request. - lidocaine (XYLOCAINE) 5 % ointment;  Apply 1 Application topically 3 (three) times daily as needed.  Dispense: 35 g; Refill: 0   Return in about 1 year (around 05/15/2024) for Annual follow up.  Carman Ching, PA-C  [facility] [place] [address] [place], [place] [address] [phone]

## 2023-06-04 ENCOUNTER — Emergency Department: Payer: Medicare HMO

## 2023-06-04 ENCOUNTER — Emergency Department
Admission: EM | Admit: 2023-06-04 | Discharge: 2023-06-04 | Disposition: A | Discharge status: Home / Self Care | Payer: Medicare HMO | Attending: Student in an Organized Health Care Education/Training Program | Admitting: Student in an Organized Health Care Education/Training Program

## 2023-06-04 ENCOUNTER — Other Ambulatory Visit: Payer: Self-pay

## 2023-06-04 DIAGNOSIS — Z9981 Dependence on supplemental oxygen: Secondary | ICD-10-CM | POA: Diagnosis not present

## 2023-06-04 DIAGNOSIS — R0902 Hypoxemia: Secondary | ICD-10-CM | POA: Diagnosis not present

## 2023-06-04 DIAGNOSIS — R0602 Shortness of breath: Secondary | ICD-10-CM | POA: Diagnosis present

## 2023-06-04 DIAGNOSIS — R0789 Other chest pain: Secondary | ICD-10-CM | POA: Insufficient documentation

## 2023-06-04 DIAGNOSIS — M25512 Pain in left shoulder: Secondary | ICD-10-CM | POA: Diagnosis present

## 2023-06-04 DIAGNOSIS — J849 Interstitial pulmonary disease, unspecified: Secondary | ICD-10-CM | POA: Diagnosis not present

## 2023-06-04 DIAGNOSIS — R9431 Abnormal electrocardiogram [ECG] [EKG]: Secondary | ICD-10-CM | POA: Insufficient documentation

## 2023-06-04 DIAGNOSIS — Z7901 Long term (current) use of anticoagulants: Secondary | ICD-10-CM | POA: Diagnosis not present

## 2023-06-04 LAB — BASIC METABOLIC PANEL
Anion gap: 9 (ref 5–15)
BUN: 31 mg/dL — ABNORMAL HIGH (ref 8–23)
CO2: 22 mmol/L (ref 22–32)
Calcium: 7.6 mg/dL — ABNORMAL LOW (ref 8.9–10.3)
Chloride: 108 mmol/L (ref 98–111)
Creatinine, Ser: 2.13 mg/dL — ABNORMAL HIGH (ref 0.61–1.24)
GFR, Estimated: 31 mL/min — ABNORMAL LOW (ref >60)
Glucose, Bld: 112 mg/dL — ABNORMAL HIGH (ref 70–99)
Potassium: 3.3 mmol/L — ABNORMAL LOW (ref 3.5–5.1)
Sodium: 139 mmol/L (ref 135–145)

## 2023-06-04 LAB — CBC
HCT: 29 % — ABNORMAL LOW (ref 39.0–52.0)
Hemoglobin: 9.1 g/dL — ABNORMAL LOW (ref 13.0–17.0)
MCH: 33.3 pg (ref 26.0–34.0)
MCHC: 31.4 g/dL (ref 30.0–36.0)
MCV: 106.2 fL — ABNORMAL HIGH (ref 80.0–100.0)
Platelets: 190 10*3/uL (ref 150–400)
RBC: 2.73 MIL/uL — ABNORMAL LOW (ref 4.22–5.81)
RDW: 14.9 % (ref 11.5–15.5)
WBC: 6.5 10*3/uL (ref 4.0–10.5)
nRBC: 0 % (ref 0.0–0.2)

## 2023-06-04 LAB — TROPONIN I (HIGH SENSITIVITY)
Troponin I (High Sensitivity): 6 ng/L (ref <18)
Troponin I (High Sensitivity): 11 ng/L (ref <18)

## 2023-06-04 LAB — BRAIN NATRIURETIC PEPTIDE: B Natriuretic Peptide: 89 pg/mL (ref 0.0–100.0)

## 2023-06-04 MED ORDER — LIDOCAINE 5 % EX PTCH: 1.0000 | MEDICATED_PATCH | Freq: Two times a day (BID) | CUTANEOUS | 0 refills | Status: DC

## 2023-06-04 MED ORDER — CYCLOBENZAPRINE HCL 5 MG PO TABS: 5.0000 mg | ORAL_TABLET | Freq: Three times a day (TID) | ORAL | 0 refills | Status: AC | PRN

## 2023-06-04 MED ORDER — LIDOCAINE 5 % EX PTCH
1.0000 | MEDICATED_PATCH | CUTANEOUS | Status: DC
  Administered 2023-06-04: 1 via TRANSDERMAL
  Filled 2023-06-04: qty 1

## 2023-06-04 NOTE — ED Triage Notes (Signed)
First nurse note: pt to ED [facility] from home for left sided chest pain radiating to neck and left arm. +shob. 4mg  zofran PTA. Wears 2 L Brandt chronic, placed on 4 L d/t SpO2 in 80s with EMS.  Ems reports pt said could not take asa d/t taking eliquis.

## 2023-06-04 NOTE — ED Provider Notes (Signed)
[facility] Provider Note    Event Date/Time   First MD Initiated Contact with Patient 06/04/23 1439     (approximate)   History   Chest Pain   HPI  Jay Morris is a 77 y.o. male history of chronic hypoxia on 3 L nasal cannula chronically presents to the ER for evaluation of several days of left shoulder and chest pain worsened with movement.  States he might of strained something while lifting heavy object from shelf above her head.  Denies any pain with deep inspiration no cough or congestion no fevers.  He is on Eliquis and has been compliant with these medications.  Denies any diaphoresis.     Physical Exam   Triage Vital Signs: ED Triage Vitals  Encounter Vitals Group     BP 06/04/23 1148 (!) 123/56     Systolic BP Percentile --      Diastolic BP Percentile --      Pulse Rate 06/04/23 1148 63     Resp 06/04/23 1145 19     Temp 06/04/23 1145 97.6 F (36.4 C)     Temp Source 06/04/23 1145 Oral     SpO2 06/04/23 1148 95 %     Weight --      Height --      Head Circumference --      Peak Flow --      Pain Score 06/04/23 1146 8     Pain Loc --      Pain Education --      Exclude from Growth Chart --     Most recent vital signs: Vitals:   06/04/23 1411 06/04/23 1502  BP: (!) 154/73 (!) 152/66  Pulse: 61 63  Resp: 18 16  Temp: 97.6 F (36.4 C)   SpO2: 98% 96%     Constitutional: Alert  Eyes: Conjunctivae are normal.  Head: Atraumatic. Nose: No congestion/rhinnorhea. Mouth/Throat: Mucous membranes are moist.   Neck: Painless ROM.  Cardiovascular:   Good peripheral circulation. Respiratory: Normal respiratory effort.  No retractions.  Gastrointestinal: Soft and nontender.  Musculoskeletal:  no deformity.  Pain is reproduced with palpation of the pectoralis muscle and deltoid. Neurologic:  MAE spontaneously. No gross focal neurologic deficits are appreciated.  Skin:  Skin is warm, dry and intact. No rash noted. Psychiatric:  Mood and affect are normal. Speech and behavior are normal.    ED Results / Procedures / Treatments   Labs (all labs ordered are listed, but only abnormal results are displayed) Labs Reviewed  BASIC METABOLIC PANEL - Abnormal; Notable for the following components:      Result Value   Potassium 3.3 (*)    Glucose, Bld 112 (*)    BUN 31 (*)    Creatinine, Ser 2.13 (*)    Calcium 7.6 (*)    GFR, Estimated 31 (*)    All other components within normal limits  CBC - Abnormal; Notable for the following components:   RBC 2.73 (*)    Hemoglobin 9.1 (*)    HCT 29.0 (*)    MCV 106.2 (*)    All other components within normal limits  BRAIN NATRIURETIC PEPTIDE  TROPONIN I (HIGH SENSITIVITY)  TROPONIN I (HIGH SENSITIVITY)     EKG  ED ECG REPORT I, Willy Eddy, the attending physician, personally viewed and interpreted this ECG.   Date: 06/04/2023  EKG Time: 11:44  Rate: 70  Rhythm: sinus  Axis: normal  Intervals: normal  ST&T Change:  no stemi, no depressions    RADIOLOGY Please see ED Course for my review and interpretation.  I personally reviewed all radiographic images ordered to evaluate for the above acute complaints and reviewed radiology reports and findings.  These findings were personally discussed with the patient.  Please see medical record for radiology report.    PROCEDURES:  Critical Care performed:   Procedures   MEDICATIONS ORDERED IN ED: Medications  lidocaine (LIDODERM) 5 % 1 patch (has no administration in time range)     IMPRESSION / MDM / ASSESSMENT AND PLAN / ED COURSE  I reviewed the triage vital signs and the nursing notes.                              Differential diagnosis includes, but is not limited to, ACS, pericarditis, esophagitis, boerhaaves, pe, dissection, pna, bronchitis, costochondritis  Patient presenting to the ER for evaluation of symptoms as described above.  Based on symptoms, risk factors and considered above  differential, this presenting complaint could reflect a potentially life-threatening illness therefore the patient will be placed on continuous pulse oximetry and telemetry for monitoring.  Laboratory evaluation will be sent to evaluate for the above complaints.  Patient is chronically ill-appearing on chronic O2 but no new hypoxia.  His vital signs are stable.  His EKG is nonischemic.  His serial enzymes are negative.  Have a low suspicion for ACS or unstable angina.  This not consistent with dissection.  Not consistent with PE and the patient is already anticoagulated.  No overlying cellulitic changes or findings to suggest shingles.  His exam suggests musculoskeletal strain.  No sign of fracture or pneumothorax.  I do not feel that further diagnostic testing clinically indicated.  He does appear stable and appropriate for outpatient follow-up.       FINAL CLINICAL IMPRESSION(S) / ED DIAGNOSES   Final diagnoses:  Atypical chest pain     Rx / DC Orders   ED Discharge Orders     None        Note:  This document was prepared using Dragon voice recognition software and may include unintentional dictation errors.    Willy Eddy, MD 06/04/23 [phone]

## 2023-06-04 NOTE — ED Triage Notes (Signed)
Pt from via EMS for left sided constant CP radiating to neck and left arm that started a week ago and has gotten increasingly worse. Pt reports increased SHOB and nausea. Pt denies dizziness. Pt is AOX4, NAD noted. See first nurse note in addition.

## 2023-07-06 ENCOUNTER — Other Ambulatory Visit: Payer: Self-pay

## 2023-07-06 DIAGNOSIS — N184 Chronic kidney disease, stage 4 (severe): Secondary | ICD-10-CM

## 2023-07-06 DIAGNOSIS — D649 Anemia, unspecified: Secondary | ICD-10-CM

## 2023-07-07 ENCOUNTER — Inpatient Hospital Stay: Payer: Medicare HMO | Attending: Oncology

## 2023-07-07 ENCOUNTER — Ambulatory Visit: Payer: Medicare HMO | Admitting: Physician Assistant

## 2023-07-07 ENCOUNTER — Encounter: Payer: Self-pay | Admitting: Oncology

## 2023-07-07 ENCOUNTER — Inpatient Hospital Stay: Payer: Medicare HMO | Admitting: Oncology

## 2023-07-07 ENCOUNTER — Inpatient Hospital Stay: Payer: Medicare HMO

## 2023-07-07 VITALS — BP 127/55 | HR 67 | Temp 97.5°F | Resp 18 | Ht 70.0 in | Wt 194.0 lb

## 2023-07-07 DIAGNOSIS — D631 Anemia in chronic kidney disease: Secondary | ICD-10-CM

## 2023-07-07 DIAGNOSIS — I129 Hypertensive chronic kidney disease with stage 1 through stage 4 chronic kidney disease, or unspecified chronic kidney disease: Secondary | ICD-10-CM | POA: Diagnosis not present

## 2023-07-07 DIAGNOSIS — N189 Chronic kidney disease, unspecified: Secondary | ICD-10-CM | POA: Diagnosis not present

## 2023-07-07 DIAGNOSIS — D649 Anemia, unspecified: Secondary | ICD-10-CM | POA: Insufficient documentation

## 2023-07-07 DIAGNOSIS — Z87891 Personal history of nicotine dependence: Secondary | ICD-10-CM | POA: Insufficient documentation

## 2023-07-07 DIAGNOSIS — D472 Monoclonal gammopathy: Secondary | ICD-10-CM | POA: Diagnosis not present

## 2023-07-07 DIAGNOSIS — Z79899 Other long term (current) drug therapy: Secondary | ICD-10-CM | POA: Insufficient documentation

## 2023-07-07 LAB — CBC WITH DIFFERENTIAL (CANCER CENTER ONLY)
Abs Immature Granulocytes: 0.03 10*3/uL (ref 0.00–0.07)
Basophils Absolute: 0.1 10*3/uL (ref 0.0–0.1)
Basophils Relative: 1 %
Eosinophils Absolute: 0.1 10*3/uL (ref 0.0–0.5)
Eosinophils Relative: 2 %
HCT: 31.2 % — ABNORMAL LOW (ref 39.0–52.0)
Hemoglobin: 10 g/dL — ABNORMAL LOW (ref 13.0–17.0)
Immature Granulocytes: 0 %
Lymphocytes Relative: 24 %
Lymphs Abs: 2.1 10*3/uL (ref 0.7–4.0)
MCH: 32.7 pg (ref 26.0–34.0)
MCHC: 32.1 g/dL (ref 30.0–36.0)
MCV: 102 fL — ABNORMAL HIGH (ref 80.0–100.0)
Monocytes Absolute: 0.7 10*3/uL (ref 0.1–1.0)
Monocytes Relative: 9 %
Neutro Abs: 5.7 10*3/uL (ref 1.7–7.7)
Neutrophils Relative %: 64 %
Platelet Count: 192 10*3/uL (ref 150–400)
RBC: 3.06 MIL/uL — ABNORMAL LOW (ref 4.22–5.81)
RDW: 14.9 % (ref 11.5–15.5)
WBC Count: 8.8 10*3/uL (ref 4.0–10.5)
nRBC: 0 % (ref 0.0–0.2)

## 2023-07-07 LAB — IRON AND TIBC
Iron: 61 ug/dL (ref 45–182)
Saturation Ratios: 16 % — ABNORMAL LOW (ref 17.9–39.5)
TIBC: 379 ug/dL (ref 250–450)
UIBC: 318 ug/dL

## 2023-07-07 LAB — FERRITIN: Ferritin: 118 ng/mL (ref 24–336)

## 2023-07-07 NOTE — Progress Notes (Signed)
[facility]  [phone] [phone]  ID: Jay Morris OB: Jan 08, 1946  MR#: [id number]  PZW#:[id number]  Patient Care Team: Marguarite Arbour, MD as PCP - General (Internal Medicine) Jeralyn Ruths, MD as Consulting Physician (Oncology)  CHIEF COMPLAINT: Anemia, unspecified.  INTERVAL HISTORY: Patient returns to clinic today for repeat laboratory work, further evaluation, and consideration of additional Aranesp.  He continues to feel well and remains asymptomatic.  He does not complain of any weakness or fatigue.  He has no neurologic complaints.  He denies any recent fevers or illnesses.  He has a good appetite and denies weight loss.  He has chronic shortness of breath requiring oxygen, but denies any chest pain, cough, or hemoptysis.  He denies any nausea, vomiting, constipation, or diarrhea.  He has no melena or hematochezia.  He has no urinary complaints.  Patient offers no further specific complaints today.  REVIEW OF SYSTEMS:   Review of Systems  Constitutional: Negative.  Negative for fever, malaise/fatigue and weight loss.  Respiratory:  Positive for shortness of breath. Negative for cough and hemoptysis.   Cardiovascular: Negative.  Negative for chest pain and leg swelling.  Gastrointestinal: Negative.  Negative for abdominal pain, blood in stool and melena.  Genitourinary: Negative.  Negative for hematuria.  Musculoskeletal: Negative.  Negative for back pain.  Skin: Negative.  Negative for rash.  Neurological: Negative.  Negative for dizziness, focal weakness, weakness and headaches.  Psychiatric/Behavioral: Negative.  The patient is not nervous/anxious.     As per HPI. Otherwise, a complete review of systems is negative.  PAST MEDICAL HISTORY: Past Medical History:  Diagnosis Date   (HFpEF) heart failure with preserved ejection fraction (HCC)    a.) TTE 04/01/2016: EF 60-65%, LAE, MAC, mild MR/AR/TR, mild-mod PR, G2DD; b.) TTE  12/18/2018: EF 55%, mild LVH, mild AR/MR/TR. mod PR; c.) TTE 05/31/2019: EF 60-65%, mild LVH, RAE/RVE, triv AR.MT, PR, mild TR; d.) TTE 10/26/2019: EF 55%, BAE, RVE, triv AR, mild MR/PR, sev TR, G2DD; e.) R/LHC 01/17/2020: mPA 52, PCWP 25, LVEDP 19, RVEDP 25, mRA 13   Anemia    Aortic atherosclerosis (HCC)    Asthma    BPH (benign prostatic hyperplasia)    CKD (chronic kidney disease), stage III (HCC)    COPD (chronic obstructive pulmonary disease) (HCC)    Coronary artery disease 01/17/2020   a.) LHC 01/17/2020: 25% oLAD, 65% o-pRCA, 45% pLAD, 65% mLAD, 35% OM2 --> med mgmt.   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   PAF (paroxysmal atrial fibrillation) (HCC)    a.) CHA2DS2-VASc = 6 (age x 2, HFpEF, HTN, vascular disease, T2DM);  b.) cardiac ablation in approx 2014-2015 at [facility]; c.) A.fib recurrence in setting of SARS-CoV-2 --> Tx'd with IV amiodarone + metoprolol + DCCV x 1 (200 J) 06/22/2019 restoring NSR; d.) rate/rhythm maintained without pharmacological intervention; chronically anticoagulated using standard dose apixaban   Peripheral vascular disease (HCC)    Phimosis of penis    Pneumonia due to COVID-19 virus  06/02/2019   a.) hospitalized at [facility]) from 06/02/2019 - 06/25/2019; Tx'd with steriods + remdesivir + convalescent plasma   Pneumonia due to COVID-19 virus 05/27/2022   a.) hospitalized at [facility] from 05/27/2022 - 05/30/2022 for acute on chronic respiratory failure secondary to SARS-CoV-2 PNA ; Tx'd with IV steroids +  remdesivir   Pulmonary fibrosis (HCC)    Pulmonary HTN (HCC) 04/01/2016   a.) TTE 04/01/2016: EF 60-65%, RVSP 40; b.) TTE 12/18/2018: EF 55%, RVSP 45.9; c.) TTE 05/31/2019: EF 60-65%, RVSP 35.5; d.) TTE 10/26/2019: EF 55%, RVSP 100.4; e.) R/LHC 01/17/2020: mPA 52, PCWP 25, LVEDP 19, RVEDP 25, mRA 13; f.) Tx with PDE5i (sildenafil) + vasodilator (treprostinil)   Type 2 diabetes mellitus treated with insulin (HCC)     PAST SURGICAL HISTORY: Past Surgical History:  Procedure Laterality Date   BACK SURGERY     x3 L3-L4    CARDIAC ELECTROPHYSIOLOGY MAPPING AND ABLATION N/A    performed at [facility] in approximately 2014-2015   CARDIOVERSION N/A 06/22/2019   Procedure: CARDIOVERSION;  Surgeon: Pricilla Riffle, MD;  Location: [facility] ENDOSCOPY;  Service: Cardiovascular;  Laterality: N/A;   COLONOSCOPY WITH PROPOFOL N/A 06/15/2017   Procedure: COLONOSCOPY WITH PROPOFOL;  Surgeon: Toledo, Boykin Nearing, MD;  Location: [facility] ENDOSCOPY;  Service: Gastroenterology;  Laterality: N/A;   DORSAL SLIT N/A 12/21/2022   Procedure: DORSAL SLIT;  Surgeon: Riki Altes, MD;  Location: [facility] ORS;  Service: Urology;  Laterality: N/A;   ENTEROSCOPY N/A 11/24/2017   Procedure: ENTEROSCOPY;  Surgeon: Wyline Mood, MD;  Location: [facility] ENDOSCOPY;  Service: Gastroenterology;  Laterality: N/A;   ESOPHAGOGASTRODUODENOSCOPY N/A 11/27/2017   Procedure: ESOPHAGOGASTRODUODENOSCOPY (EGD);  Surgeon: Midge Minium, MD;  Location: [facility] ENDOSCOPY;  Service: Endoscopy;  Laterality: N/A;   ESOPHAGOGASTRODUODENOSCOPY (EGD) WITH PROPOFOL N/A 06/02/2017   Procedure: ESOPHAGOGASTRODUODENOSCOPY (EGD) WITH PROPOFOL;  Surgeon: Toledo, Boykin Nearing, MD;  Location: [facility] ENDOSCOPY;  Service: Gastroenterology;  Laterality: N/A;   ESOPHAGOGASTRODUODENOSCOPY (EGD) WITH PROPOFOL N/A 08/04/2021   Procedure: ESOPHAGOGASTRODUODENOSCOPY (EGD) WITH PROPOFOL;  Surgeon: Regis Bill, MD;  Location: [facility] ENDOSCOPY;  Service: Endoscopy;  Laterality: N/A;   EVALUATION  UNDER ANESTHESIA WITH HEMORRHOIDECTOMY N/A 02/12/2022   Procedure: EXAM UNDER ANESTHESIA WITH HEMORRHOIDECTOMY;  Surgeon: Sung Amabile, DO;  Location: [facility] ORS;  Service: General;  Laterality: N/A;   GIVENS CAPSULE STUDY N/A 11/25/2017   Procedure: GIVENS CAPSULE STUDY;  Surgeon: Wyline Mood, MD;  Location: [facility] ENDOSCOPY;  Service: Gastroenterology;  Laterality: N/A;   HAND SURGERY     RIGHT/LEFT HEART CATH AND CORONARY ANGIOGRAPHY N/A 01/17/2020   Procedure: RIGHT/LEFT HEART CATH AND CORONARY ANGIOGRAPHY;  Surgeon: Lamar Blinks, MD;  Location: [facility] INVASIVE CV LAB;  Service: Cardiovascular;  Laterality: N/A;    FAMILY HISTORY: Family History  Problem Relation Age of Onset   Heart disease Mother    Cancer Father    Cancer Paternal Grandfather     ADVANCED DIRECTIVES (Y/N):  N  HEALTH MAINTENANCE: Social History   Tobacco Use   Smoking status: Former    Current packs/day: 0.00    Average packs/day: 1 pack/day for 30.0 years (30.0 ttl pk-yrs)    Types: Cigarettes    Start date: 7    Quit date: 1995    Years since quitting: 29.9   Smokeless tobacco: Never  Vaping Use   Vaping status: Never Used  Substance Use Topics   Alcohol use: No    Alcohol/week: 0.0 standard drinks of alcohol  Comment: occasional   Drug use: No     Colonoscopy:  PAP:  Bone density:  Lipid panel:  Allergies  Allergen Reactions   Shrimp [Shellfish Allergy] Anaphylaxis   Shrimp Extract     Current Outpatient Medications  Medication Sig Dispense Refill   acetaminophen (TYLENOL) 500 MG tablet Take 500-1,000 mg by mouth every 6 (six) hours as needed for moderate pain or headache. 30 tablet 0   allopurinol (ZYLOPRIM) 100 MG tablet Take 200 mg by mouth daily.     apixaban (ELIQUIS) 5 MG TABS tablet Take 5 mg by mouth daily.     ascorbic acid (VITAMIN C) 500 MG tablet Take 500 mg by mouth daily.     atorvastatin (LIPITOR) 20 MG tablet Take 20 mg by mouth at bedtime.      calcitRIOL  (ROCALTROL) 0.25 MCG capsule Take 0.25 mcg by mouth daily.     Cholecalciferol (DIALYVITE VITAMIN D 5000) 125 MCG (5000 UT) capsule Take 5,000 Units by mouth daily.     colchicine 0.6 MG tablet Take 0.6 mg by mouth daily.     cyclobenzaprine (FLEXERIL) 5 MG tablet Take 1 tablet (5 mg total) by mouth 3 (three) times daily as needed. 12 tablet 0   finasteride (PROSCAR) 5 MG tablet TAKE 1 TABLET BY MOUTH DAILY (Patient taking differently: Take 5 mg by mouth daily.) 30 tablet 11   fluticasone furoate-vilanterol (BREO ELLIPTA) 100-25 MCG/ACT AEPB Inhale 1 puff into the lungs daily.     furosemide (LASIX) 20 MG tablet Take 20 mg by mouth daily.     insulin aspart (NOVOLOG) 100 UNIT/ML FlexPen Inject 35-45 Units into the skin 3 (three) times daily with meals. (Based upon blood glucose reading)     insulin degludec (TRESIBA FLEXTOUCH) 200 UNIT/ML FlexTouch Pen Inject 80 Units into the skin at bedtime.     lidocaine (LIDODERM) 5 % Place 1 patch onto the skin every 12 (twelve) hours. Remove & Discard patch within 12 hours or as directed by MD 10 patch 0   lidocaine (XYLOCAINE) 5 % ointment Apply 1 Application topically 3 (three) times daily as needed. 35 g 0   lisinopril (ZESTRIL) 5 MG tablet Take 5 mg by mouth daily.     magnesium oxide (MAG-OX) 400 MG tablet Take 400 mg by mouth 3 (three) times daily.     metoprolol tartrate (LOPRESSOR) 50 MG tablet Take 50 mg by mouth 2 (two) times daily.     nystatin-triamcinolone ointment (MYCOLOG) Apply 1 Application topically 2 (two) times daily. (Patient taking differently: Apply 1 Application topically as needed.) 30 g 2   pantoprazole (PROTONIX) 40 MG tablet Take 1 tablet (40 mg total) by mouth 2 (two) times daily before a meal. 60 tablet 0   sildenafil (REVATIO) 20 MG tablet Take 20 mg by mouth 3 (three) times daily.     tamsulosin (FLOMAX) 0.4 MG CAPS capsule Take 0.4 mg by mouth daily after supper.     Treprostinil (TYVASO) 0.6 MG/ML SOLN Inhale 18 mcg into the  lungs daily.     vitamin B-12 (CYANOCOBALAMIN) 1000 MCG tablet Take 1,000 mcg by mouth daily.     No current facility-administered medications for this visit.    OBJECTIVE: Vitals:   07/07/23 1420  BP: (!) 127/55  Pulse: 67  Resp: 18  Temp: (!) 97.5 F (36.4 C)  SpO2: 95%      Body mass index is 27.84 kg/m.    ECOG FS:1 - Symptomatic but completely ambulatory  General: Well-developed, well-nourished, no acute distress.  Sitting in a wheelchair. Eyes: Pink conjunctiva, anicteric sclera. HEENT: Normocephalic, moist mucous membranes. Lungs: No audible wheezing or coughing. Heart: Regular rate and rhythm. Abdomen: Soft, nontender, no obvious distention. Musculoskeletal: No edema, cyanosis, or clubbing. Neuro: Alert, answering all questions appropriately. Cranial nerves grossly intact. Skin: No rashes or petechiae noted. Psych: Normal affect.   LAB RESULTS:  Lab Results  Component Value Date   NA 139 06/04/2023   K 3.3 (L) 06/04/2023   CL 108 06/04/2023   CO2 22 06/04/2023   GLUCOSE 112 (H) 06/04/2023   BUN 31 (H) 06/04/2023   CREATININE 2.13 (H) 06/04/2023   CALCIUM 7.6 (L) 06/04/2023   PROT 6.5 05/30/2022   ALBUMIN 3.7 05/30/2022   AST 45 (H) 05/30/2022   ALT 68 (H) 05/30/2022   ALKPHOS 31 (L) 05/30/2022   BILITOT 0.7 05/30/2022   GFRNONAA 31 (L) 06/04/2023   GFRAA 39 (L) 10/18/2019    Lab Results  Component Value Date   WBC 8.8 07/07/2023   NEUTROABS 5.7 07/07/2023   HGB 10.0 (L) 07/07/2023   HCT 31.2 (L) 07/07/2023   MCV 102.0 (H) 07/07/2023   PLT 192 07/07/2023   Lab Results  Component Value Date   IRON 59 03/07/2023   TIBC 349 03/07/2023   IRONPCTSAT 17 (L) 03/07/2023   Lab Results  Component Value Date   FERRITIN 142 03/07/2023     STUDIES: No results found.   ASSESSMENT: Anemia, unspecified.  PLAN:   Anemia, unspecified: Bone marrow biopsy completed on June 25, 2020 was essentially negative only with a hypercellular marrow for  age.  Patient was noted to have decreased iron stores and rare ringed sideroblasts.  FISH and cytogenetics were negative.  He received Venofer x 5 with his most recent treatment on May 06, 2022.  Repeat iron stores in August 2024 were essentially within normal limits except for mildly decreased iron saturation of 17%.  Previously, repeat B12, folate, and hemolysis labs were found to be either negative or within normal limits.  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  Patient's hemoglobin is 10.0 today and he has declined to pursue treatment with Aranesp.  His last treatment was on October 21, 2022.  Return to clinic in 4 months with repeat laboratory, further evaluation and continuation of treatment at which point patient could possibly be transition to laboratory work and evaluation every 6 months.   MGUS: Patient previously noted to have an M spike of 0.3, but repeat laboratory work in October 2023 this was no longer observed.  Both kappa and lambda free light chains are only mildly elevated and immunoglobulins are within normal limits.  Bone marrow biopsy only revealed approximately 1% plasma cells.  Likely clinically insignificant. Chronic renal insufficiency: Chronic and unchanged.  Patient's most recent creatinine had mildly improved to 2.13.  Continue follow-up with nephrology as indicated. Hypertension: Mild.  Continue monitoring and treatment per primary care.  Patient expressed understanding and was in agreement with this plan. He also understands that He can call clinic at any time with any questions, concerns, or complaints.    Jeralyn Ruths, MD   07/07/2023 2:54 PM

## 2023-07-29 ENCOUNTER — Ambulatory Visit: Payer: Medicare HMO | Admitting: Physician Assistant

## 2023-11-04 ENCOUNTER — Other Ambulatory Visit: Payer: Self-pay | Admitting: *Deleted

## 2023-11-04 DIAGNOSIS — D649 Anemia, unspecified: Secondary | ICD-10-CM

## 2023-11-07 ENCOUNTER — Inpatient Hospital Stay: Payer: Medicare HMO

## 2023-11-07 ENCOUNTER — Telehealth: Payer: Self-pay | Admitting: *Deleted

## 2023-11-07 ENCOUNTER — Inpatient Hospital Stay: Payer: Medicare HMO | Admitting: Oncology

## 2023-11-07 NOTE — Telephone Encounter (Signed)
 4/25 at 11 am. Wife said that she lost the date, she will put it on paper to [facility]

## 2023-11-11 ENCOUNTER — Encounter: Payer: Self-pay | Admitting: Oncology

## 2023-11-11 ENCOUNTER — Inpatient Hospital Stay: Attending: Oncology

## 2023-11-11 ENCOUNTER — Inpatient Hospital Stay (HOSPITAL_BASED_OUTPATIENT_CLINIC_OR_DEPARTMENT_OTHER): Admitting: Oncology

## 2023-11-11 ENCOUNTER — Inpatient Hospital Stay

## 2023-11-11 VITALS — BP 109/59 | HR 66 | Temp 97.9°F | Resp 18 | Ht 70.0 in | Wt 198.9 lb

## 2023-11-11 DIAGNOSIS — D649 Anemia, unspecified: Secondary | ICD-10-CM

## 2023-11-11 DIAGNOSIS — D631 Anemia in chronic kidney disease: Secondary | ICD-10-CM | POA: Insufficient documentation

## 2023-11-11 DIAGNOSIS — N184 Chronic kidney disease, stage 4 (severe): Secondary | ICD-10-CM | POA: Insufficient documentation

## 2023-11-11 DIAGNOSIS — Z79899 Other long term (current) drug therapy: Secondary | ICD-10-CM | POA: Diagnosis not present

## 2023-11-11 LAB — CBC WITH DIFFERENTIAL/PLATELET
Abs Immature Granulocytes: 0.02 10*3/uL (ref 0.00–0.07)
Basophils Absolute: 0.1 10*3/uL (ref 0.0–0.1)
Basophils Relative: 1 %
Eosinophils Absolute: 0.2 10*3/uL (ref 0.0–0.5)
Eosinophils Relative: 2 %
HCT: 29.5 % — ABNORMAL LOW (ref 39.0–52.0)
Hemoglobin: 9.5 g/dL — ABNORMAL LOW (ref 13.0–17.0)
Immature Granulocytes: 0 %
Lymphocytes Relative: 23 %
Lymphs Abs: 1.8 10*3/uL (ref 0.7–4.0)
MCH: 32.4 pg (ref 26.0–34.0)
MCHC: 32.2 g/dL (ref 30.0–36.0)
MCV: 100.7 fL — ABNORMAL HIGH (ref 80.0–100.0)
Monocytes Absolute: 0.6 10*3/uL (ref 0.1–1.0)
Monocytes Relative: 8 %
Neutro Abs: 5.2 10*3/uL (ref 1.7–7.7)
Neutrophils Relative %: 66 %
Platelets: 216 10*3/uL (ref 150–400)
RBC: 2.93 MIL/uL — ABNORMAL LOW (ref 4.22–5.81)
RDW: 14.7 % (ref 11.5–15.5)
WBC: 7.8 10*3/uL (ref 4.0–10.5)
nRBC: 0.3 % — ABNORMAL HIGH (ref 0.0–0.2)

## 2023-11-11 LAB — IRON AND TIBC
Iron: 60 ug/dL (ref 45–182)
Saturation Ratios: 17 % — ABNORMAL LOW (ref 17.9–39.5)
TIBC: 360 ug/dL (ref 250–450)
UIBC: 300 ug/dL

## 2023-11-11 LAB — FERRITIN: Ferritin: 66 ng/mL (ref 24–336)

## 2023-11-11 MED ORDER — DARBEPOETIN ALFA 200 MCG/0.4ML IJ SOSY
200.0000 ug | PREFILLED_SYRINGE | Freq: Once | INTRAMUSCULAR | Status: AC
  Administered 2023-11-11: 200 ug via SUBCUTANEOUS
  Filled 2023-11-11: qty 0.4

## 2023-11-11 NOTE — Progress Notes (Signed)
 [facility]  [phone] Fax:(336) [phone]  ID: Jay Morris OB: 04/03/1946  MR#: [id number]  AOZ#:[id number]  Patient Care Team: Yehuda Helms, MD as PCP - General (Internal Medicine) Shellie Dials, MD as Consulting Physician (Oncology)  CHIEF COMPLAINT: Anemia, unspecified.  INTERVAL HISTORY: Patient returns to clinic today for repeat laboratory work, further evaluation, and consideration of Aranesp .  He continues to feel well and at his baseline.  He does not complain of any weakness or fatigue.  He has no neurologic complaints.  He denies any recent fevers or illnesses.  He has a good appetite and denies weight loss.  He has chronic shortness of breath requiring oxygen, but denies any chest pain, cough, or hemoptysis.  He denies any nausea, vomiting, constipation, or diarrhea.  He has no melena or hematochezia.  He has no urinary complaints.  Patient offers no further specific complaints today.  REVIEW OF SYSTEMS:   Review of Systems  Constitutional: Negative.  Negative for fever, malaise/fatigue and weight loss.  Respiratory:  Positive for shortness of breath. Negative for cough and hemoptysis.   Cardiovascular: Negative.  Negative for chest pain and leg swelling.  Gastrointestinal: Negative.  Negative for abdominal pain, blood in stool and melena.  Genitourinary: Negative.  Negative for hematuria.  Musculoskeletal: Negative.  Negative for back pain.  Skin: Negative.  Negative for rash.  Neurological: Negative.  Negative for dizziness, focal weakness, weakness and headaches.  Psychiatric/Behavioral: Negative.  The patient is not nervous/anxious.     As per HPI. Otherwise, a complete review of systems is negative.  PAST MEDICAL HISTORY: Past Medical History:  Diagnosis Date   (HFpEF) heart failure with preserved ejection fraction (HCC)    a.) TTE 04/01/2016: EF 60-65%, LAE, MAC, mild MR/AR/TR, mild-mod PR, G2DD; b.) TTE 12/18/2018: EF  55%, mild LVH, mild AR/MR/TR. mod PR; c.) TTE 05/31/2019: EF 60-65%, mild LVH, RAE/RVE, triv AR.MT, PR, mild TR; d.) TTE 10/26/2019: EF 55%, BAE, RVE, triv AR, mild MR/PR, sev TR, G2DD; e.) R/LHC 01/17/2020: mPA 52, PCWP 25, LVEDP 19, RVEDP 25, mRA 13   Anemia    Aortic atherosclerosis (HCC)    Asthma    BPH (benign prostatic hyperplasia)    CKD (chronic kidney disease), stage III (HCC)    COPD (chronic obstructive pulmonary disease) (HCC)    Coronary artery disease 01/17/2020   a.) LHC 01/17/2020: 25% oLAD, 65% o-pRCA, 45% pLAD, 65% mLAD, 35% OM2 --> med mgmt.   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   PAF (paroxysmal atrial fibrillation) (HCC)    a.) CHA2DS2-VASc = 6 (age x 2, HFpEF, HTN, vascular disease, T2DM);  b.) cardiac ablation in approx 2014-2015 at [facility]; c.) A.fib recurrence in setting of SARS-CoV-2 --> Tx'd with IV amiodarone  + metoprolol  + DCCV x 1 (200 J) 06/22/2019 restoring NSR; d.) rate/rhythm maintained without pharmacological intervention; chronically anticoagulated using standard dose apixaban    Peripheral vascular disease (HCC)    Phimosis of penis    Pneumonia due to COVID-19 virus  06/02/2019   a.) hospitalized at [facility] from 06/02/2019 - 06/25/2019; Tx'd with steriods + remdesivir  + convalescent plasma   Pneumonia due to COVID-19 virus 05/27/2022   a.) hospitalized at [facility] from 05/27/2022 - 05/30/2022 for acute on chronic respiratory failure secondary to SARS-CoV-2 PNA ; Tx'd with IV steroids + remdesivir     Pulmonary fibrosis (HCC)    Pulmonary HTN (HCC) 04/01/2016   a.) TTE 04/01/2016: EF 60-65%, RVSP 40; b.) TTE 12/18/2018: EF 55%, RVSP 45.9; c.) TTE 05/31/2019: EF 60-65%, RVSP 35.5; d.) TTE 10/26/2019: EF 55%, RVSP 100.4; e.) R/LHC 01/17/2020: mPA 52, PCWP 25, LVEDP 19, RVEDP 25, mRA 13; f.) Tx with PDE5i (sildenafil ) + vasodilator (treprostinil )   Type 2 diabetes mellitus treated with insulin  (HCC)     PAST SURGICAL HISTORY: Past Surgical History:  Procedure Laterality Date   BACK SURGERY     x3 L3-L4    CARDIAC ELECTROPHYSIOLOGY MAPPING AND ABLATION N/A    performed at [facility] in approximately 2014-2015   CARDIOVERSION N/A 06/22/2019   Procedure: CARDIOVERSION;  Surgeon: Elmyra Haggard, MD;  Location: [facility] ENDOSCOPY;  Service: Cardiovascular;  Laterality: N/A;   COLONOSCOPY WITH PROPOFOL  N/A 06/15/2017   Procedure: COLONOSCOPY WITH PROPOFOL ;  Surgeon: Toledo, Alphonsus Jeans, MD;  Location: [facility] ENDOSCOPY;  Service: Gastroenterology;  Laterality: N/A;   DORSAL SLIT N/A 12/21/2022   Procedure: DORSAL SLIT;  Surgeon: Geraline Knapp, MD;  Location: [facility] ORS;  Service: Urology;  Laterality: N/A;   ENTEROSCOPY N/A 11/24/2017   Procedure: ENTEROSCOPY;  Surgeon: Luke Salaam, MD;  Location: [facility] ENDOSCOPY;  Service: Gastroenterology;  Laterality: N/A;   ESOPHAGOGASTRODUODENOSCOPY N/A 11/27/2017   Procedure: ESOPHAGOGASTRODUODENOSCOPY (EGD);  Surgeon: Marnee Sink, MD;  Location: [facility] ENDOSCOPY;  Service: Endoscopy;  Laterality: N/A;   ESOPHAGOGASTRODUODENOSCOPY (EGD) WITH PROPOFOL  N/A 06/02/2017   Procedure: ESOPHAGOGASTRODUODENOSCOPY (EGD) WITH PROPOFOL ;  Surgeon: Toledo, Alphonsus Jeans, MD;  Location: [facility] ENDOSCOPY;  Service: Gastroenterology;  Laterality: N/A;   ESOPHAGOGASTRODUODENOSCOPY (EGD) WITH PROPOFOL  N/A 08/04/2021   Procedure: ESOPHAGOGASTRODUODENOSCOPY (EGD) WITH PROPOFOL ;  Surgeon: Shane Darling, MD;  Location: [facility] ENDOSCOPY;  Service: Endoscopy;  Laterality: N/A;   EVALUATION UNDER  ANESTHESIA WITH HEMORRHOIDECTOMY N/A 02/12/2022   Procedure: EXAM UNDER ANESTHESIA WITH HEMORRHOIDECTOMY;  Surgeon: Conrado Delay, DO;  Location: [facility] ORS;  Service: General;  Laterality: N/A;   GIVENS CAPSULE STUDY N/A 11/25/2017   Procedure: GIVENS CAPSULE STUDY;  Surgeon: Luke Salaam, MD;  Location: [facility] ENDOSCOPY;  Service: Gastroenterology;  Laterality: N/A;   HAND SURGERY     RIGHT/LEFT HEART CATH AND CORONARY ANGIOGRAPHY N/A 01/17/2020   Procedure: RIGHT/LEFT HEART CATH AND CORONARY ANGIOGRAPHY;  Surgeon: Michelle Aid, MD;  Location: [facility] INVASIVE CV LAB;  Service: Cardiovascular;  Laterality: N/A;    FAMILY HISTORY: Family History  Problem Relation Age of Onset   Heart disease Mother    Cancer Father    Cancer Paternal Grandfather     ADVANCED DIRECTIVES (Y/N):  N  HEALTH MAINTENANCE: Social History   Tobacco Use   Smoking status: Former    Current packs/day: 0.00    Average packs/day: 1 pack/day for 30.0 years (30.0 ttl pk-yrs)    Types: Cigarettes    Start date: 21    Quit date: 1995    Years since quitting: 30.3   Smokeless tobacco: Never  Vaping Use   Vaping status: Never Used  Substance Use Topics   Alcohol use: No    Alcohol/week: 0.0 standard drinks of alcohol  Comment: occasional   Drug use: No     Colonoscopy:  PAP:  Bone density:  Lipid panel:  Allergies  Allergen Reactions   Shrimp [Shellfish Allergy] Anaphylaxis   Shrimp Extract     Current Outpatient Medications  Medication Sig Dispense Refill   acetaminophen  (TYLENOL ) 500 MG tablet Take 500-1,000 mg by mouth every 6 (six) hours as needed for moderate pain or headache. 30 tablet 0   allopurinol  (ZYLOPRIM ) 100 MG tablet Take 200 mg by mouth daily.     apixaban  (ELIQUIS ) 5 MG TABS tablet Take 5 mg by mouth daily.     ascorbic acid  (VITAMIN C ) 500 MG tablet Take 500 mg by mouth daily.     atorvastatin  (LIPITOR) 20 MG tablet Take 20 mg by mouth at bedtime.      calcitRIOL  (ROCALTROL )  0.25 MCG capsule Take 0.25 mcg by mouth daily.     Cholecalciferol  (DIALYVITE VITAMIN D  5000) 125 MCG (5000 UT) capsule Take 5,000 Units by mouth daily.     colchicine  0.6 MG tablet Take 0.6 mg by mouth daily.     cyclobenzaprine  (FLEXERIL ) 5 MG tablet Take 1 tablet (5 mg total) by mouth 3 (three) times daily as needed. 12 tablet 0   finasteride  (PROSCAR ) 5 MG tablet TAKE 1 TABLET BY MOUTH DAILY (Patient taking differently: Take 5 mg by mouth daily.) 30 tablet 11   fluticasone  furoate-vilanterol (BREO ELLIPTA ) 100-25 MCG/ACT AEPB Inhale 1 puff into the lungs daily.     furosemide  (LASIX ) 20 MG tablet Take 20 mg by mouth daily.     HYDROcodone -acetaminophen  (NORCO/VICODIN) 5-325 MG tablet Take 1 tablet by mouth every 4 (four) hours as needed.     insulin  aspart (NOVOLOG ) 100 UNIT/ML FlexPen Inject 35-45 Units into the skin 3 (three) times daily with meals. (Based upon blood glucose reading)     insulin  degludec (TRESIBA  FLEXTOUCH) 200 UNIT/ML FlexTouch Pen Inject 80 Units into the skin at bedtime.     lidocaine  (LIDODERM ) 5 % Place 1 patch onto the skin every 12 (twelve) hours. Remove & Discard patch within 12 hours or as directed by MD 10 patch 0   lidocaine  (XYLOCAINE ) 5 % ointment Apply 1 Application topically 3 (three) times daily as needed. 35 g 0   lisinopril  (ZESTRIL ) 5 MG tablet Take 5 mg by mouth daily.     magnesium  oxide (MAG-OX) 400 MG tablet Take 400 mg by mouth 3 (three) times daily.     metoprolol  tartrate (LOPRESSOR ) 50 MG tablet Take 50 mg by mouth 2 (two) times daily.     nystatin -triamcinolone  ointment (MYCOLOG) Apply 1 Application topically 2 (two) times daily. (Patient taking differently: Apply 1 Application topically as needed.) 30 g 2   pantoprazole  (PROTONIX ) 40 MG tablet Take 1 tablet (40 mg total) by mouth 2 (two) times daily before a meal. 60 tablet 0   sildenafil  (REVATIO ) 20 MG tablet Take 20 mg by mouth 3 (three) times daily.     tamsulosin  (FLOMAX ) 0.4 MG CAPS capsule  Take 0.4 mg by mouth daily after supper.     vitamin B-12 (CYANOCOBALAMIN ) 1000 MCG tablet Take 1,000 mcg by mouth daily.     Treprostinil  (TYVASO ) 0.6 MG/ML SOLN Inhale 18 mcg into the lungs daily. (Patient not taking: Reported on 11/11/2023)     No current facility-administered medications for this visit.   Facility-Administered Medications Ordered in Other Visits  Medication Dose Route Frequency Provider Last Rate Last Admin   Darbepoetin Alfa  (ARANESP ) injection 200  mcg  200 mcg Subcutaneous Once Savva Beamer J, MD        OBJECTIVE: Vitals:   11/11/23 1101  BP: (!) 109/59  Pulse: 66  Resp: 18  Temp: 97.9 F (36.6 C)  SpO2: (!) 89%      Body mass index is 28.54 kg/m.    ECOG FS:1 - Symptomatic but completely ambulatory  General: Well-developed, well-nourished, no acute distress.  Sitting in a wheelchair. Eyes: Pink conjunctiva, anicteric sclera. HEENT: Normocephalic, moist mucous membranes. Lungs: No audible wheezing or coughing. Heart: Regular rate and rhythm. Abdomen: Soft, nontender, no obvious distention. Musculoskeletal: No edema, cyanosis, or clubbing. Neuro: Alert, answering all questions appropriately. Cranial nerves grossly intact. Skin: No rashes or petechiae noted. Psych: Normal affect.  LAB RESULTS:  Lab Results  Component Value Date   NA 139 06/04/2023   K 3.3 (L) 06/04/2023   CL 108 06/04/2023   CO2 22 06/04/2023   GLUCOSE 112 (H) 06/04/2023   BUN 31 (H) 06/04/2023   CREATININE 2.13 (H) 06/04/2023   CALCIUM  7.6 (L) 06/04/2023   PROT 6.5 05/30/2022   ALBUMIN  3.7 05/30/2022   AST 45 (H) 05/30/2022   ALT 68 (H) 05/30/2022   ALKPHOS 31 (L) 05/30/2022   BILITOT 0.7 05/30/2022   GFRNONAA 31 (L) 06/04/2023   GFRAA 39 (L) 10/18/2019    Lab Results  Component Value Date   WBC 7.8 11/11/2023   NEUTROABS 5.2 11/11/2023   HGB 9.5 (L) 11/11/2023   HCT 29.5 (L) 11/11/2023   MCV 100.7 (H) 11/11/2023   PLT 216 11/11/2023   Lab Results  Component  Value Date   IRON  61 07/07/2023   TIBC 379 07/07/2023   IRONPCTSAT 16 (L) 07/07/2023   Lab Results  Component Value Date   FERRITIN 118 07/07/2023     STUDIES: No results found.   ASSESSMENT: Anemia, unspecified.  PLAN:   Anemia, unspecified: Bone marrow biopsy completed on June 25, 2020 was essentially negative only with a hypercellular marrow for age.  Patient was noted to have decreased iron  stores and rare ringed sideroblasts.  FISH and cytogenetics were negative.  He received Venofer  x 5 with his most recent treatment on May 06, 2022.  Repeat iron  stores on July 07, 2023 were essentially within normal limits except for mildly decreased saturation ratio of 16%.  Previously, B12, folate, and hemolysis labs were found to be either negative or within normal limits.  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  Patient's hemoglobin has trended down slightly to 9.5 today, therefore patient received 200 mcg Aranesp  today.  He has requested less frequent follow-up, therefore he will return to clinic in 6 months with repeat laboratory work, further evaluation, and consideration of additional treatment.    MGUS: Patient previously noted to have an M spike of 0.3, but repeat laboratory work in October 2023 this was no longer observed.  Both kappa and lambda free light chains are only mildly elevated and immunoglobulins are within normal limits.  Bone marrow biopsy only revealed approximately 1% plasma cells.  Likely clinically insignificant. Chronic renal insufficiency: Chronic and unchanged. Continue follow-up with nephrology as indicated. Hypertension: Patient's blood pressure is within normal limits today.  Continue monitoring and treatment per primary care.  Patient expressed understanding and was in agreement with this plan. He also understands that He can call clinic at any time with any questions, concerns, or complaints.     Shellie Dials, MD  11/11/2023 11:41 AM

## 2024-02-12 ENCOUNTER — Other Ambulatory Visit: Payer: Self-pay

## 2024-02-12 DIAGNOSIS — K625 Hemorrhage of anus and rectum: Secondary | ICD-10-CM | POA: Diagnosis present

## 2024-02-12 DIAGNOSIS — K644 Residual hemorrhoidal skin tags: Secondary | ICD-10-CM | POA: Diagnosis not present

## 2024-02-12 NOTE — ED Triage Notes (Signed)
 Pt presents via POV c/o bleeding hemorrhoid x2 weeks and rectal pain.   Pt on home 02.

## 2024-02-13 ENCOUNTER — Emergency Department
Admission: EM | Admit: 2024-02-13 | Discharge: 2024-02-13 | Disposition: A | Discharge status: Home / Self Care | Attending: Emergency Medicine | Admitting: Emergency Medicine

## 2024-02-13 DIAGNOSIS — D649 Anemia, unspecified: Secondary | ICD-10-CM

## 2024-02-13 DIAGNOSIS — K649 Unspecified hemorrhoids: Secondary | ICD-10-CM

## 2024-02-13 LAB — CBC
HCT: 26.7 % — ABNORMAL LOW (ref 39.0–52.0)
Hemoglobin: 8.3 g/dL — ABNORMAL LOW (ref 13.0–17.0)
MCH: 31.7 pg (ref 26.0–34.0)
MCHC: 31.1 g/dL (ref 30.0–36.0)
MCV: 101.9 fL — ABNORMAL HIGH (ref 80.0–100.0)
Platelets: 234 K/uL (ref 150–400)
RBC: 2.62 MIL/uL — ABNORMAL LOW (ref 4.22–5.81)
RDW: 16.7 % — ABNORMAL HIGH (ref 11.5–15.5)
WBC: 10.2 K/uL (ref 4.0–10.5)
nRBC: 0 % (ref 0.0–0.2)

## 2024-02-13 LAB — BASIC METABOLIC PANEL WITH GFR
Anion gap: 11 (ref 5–15)
BUN: 46 mg/dL — ABNORMAL HIGH (ref 8–23)
CO2: 20 mmol/L — ABNORMAL LOW (ref 22–32)
Calcium: 9 mg/dL (ref 8.9–10.3)
Chloride: 111 mmol/L (ref 98–111)
Creatinine, Ser: 2.95 mg/dL — ABNORMAL HIGH (ref 0.61–1.24)
GFR, Estimated: 21 mL/min — ABNORMAL LOW (ref >60)
Glucose, Bld: 117 mg/dL — ABNORMAL HIGH (ref 70–99)
Potassium: 4.3 mmol/L (ref 3.5–5.1)
Sodium: 142 mmol/L (ref 135–145)

## 2024-02-13 MED ORDER — PROCTOFOAM HC 1-1 % EX FOAM: 1.0000 | Freq: Two times a day (BID) | CUTANEOUS | 1 refills | Status: DC

## 2024-02-13 NOTE — ED Provider Notes (Signed)
 [facility] Provider Note    Event Date/Time   First MD Initiated Contact with Patient 02/13/24 [phone]     (approximate)   History   Hemorrhoids   HPI Jay Morris is a 78 y.o. male who presents for evaluation of pain and bleeding from his hemorrhoids.  He said that he has had issues with hemorrhoids in the past and had a hemorrhoidectomy by Jay Morris about 2 years ago.  He started having trouble again over the last month or so and over the last 2 weeks the bleeding and the pain have really started to bother him.  He has tried to use some over-the-counter ointment and occasional sitz bath but it does not seem to help.  He does not feel lightheaded or dizzy and is not having any chest pain or shortness of breath.  He is concerned because he has chronic anemia and has seen Jay Morris in the past for anemia shots and infusions, but he has not seen him for several months at least.  No recent fever, no nausea or vomiting.  Normal bowel and bladder habits.     Physical Exam   Triage Vital Signs: ED Triage Vitals  Encounter Vitals Group     BP 02/12/24 2333 112/72     Girls Systolic BP Percentile --      Girls Diastolic BP Percentile --      Boys Systolic BP Percentile --      Boys Diastolic BP Percentile --      Pulse Rate 02/12/24 2333 95     Resp 02/12/24 2333 16     Temp 02/12/24 2333 (!) 97.4 F (36.3 C)     Temp Source 02/12/24 2333 Oral     SpO2 02/12/24 2333 99 %     Weight --      Height --      Head Circumference --      Peak Flow --      Pain Score 02/12/24 2334 8     Pain Loc --      Pain Education --      Exclude from Growth Chart --     Most recent vital signs: Vitals:   02/12/24 2333 02/13/24 0145  BP: 112/72 121/63  Pulse: 95 (!) 55  Resp: 16 16  Temp: (!) 97.4 F (36.3 C) 97.8 F (36.6 C)  SpO2: 99% 96%    General: Awake, no distress, awake alert and conversant. CV:  Good peripheral perfusion.  Regular rate and  rhythm. Resp:  Normal effort. Speaking easily and comfortably, no accessory muscle usage nor intercostal retractions.  Lungs are clear to auscultation.  Patient is on chronic oxygen at baseline. Abd:  No distention.  No tenderness to palpation of the abdomen. Other:  Large but soft, nonthrombosed external hemorrhoid.  No active bleeding.  No evidence of perirectal or perianal abscess or cellulitis.  Digital rectal exam is tender but not excessively so, and there is no gross blood after the exam.   ED Results / Procedures / Treatments   Labs (all labs ordered are listed, but only abnormal results are displayed) Labs Reviewed  CBC - Abnormal; Notable for the following components:      Result Value   RBC 2.62 (*)    Hemoglobin 8.3 (*)    HCT 26.7 (*)    MCV 101.9 (*)    RDW 16.7 (*)    All other components within normal limits  BASIC METABOLIC PANEL WITH  GFR - Abnormal; Notable for the following components:   CO2 20 (*)    Glucose, Bld 117 (*)    BUN 46 (*)    Creatinine, Ser 2.95 (*)    GFR, Estimated 21 (*)    All other components within normal limits     PROCEDURES:  Critical Care performed: No  Procedures    IMPRESSION / MDM / ASSESSMENT AND PLAN / ED COURSE  I reviewed the triage vital signs and the nursing notes.                              Differential diagnosis includes, but is not limited to, external hemorrhoid, internal hemorrhoid, peritoneal abscess, perianal abscess, fistula, anal fissure, more proximal GI bleed, anemia.  Patient's presentation is most consistent with acute presentation with potential threat to life or bodily function.  Labs/studies ordered: BMP, CBC  Interventions/Medications given:  Medications - No data to display  (Note:  hospital course my include additional interventions and/or labs/studies not listed above.)   I reviewed the medical record and verified by reading the notes of Jay Morris from August 2023 verifying that the  patient had a hemorrhoidectomy and associated clinic appointment.  Vital signs are stable within normal limits and physical exam is generally reassuring.  He has no gross blood per rectum at this time and no bleeding hemorrhoids although he has a sizable external hemorrhoid and probable internal hemorrhoid.  He has a mildly increased troponin over his baseline chronic kidney disease but not a substantial change in his GFR and he is tolerating oral intake without difficulty.  His hemoglobin is about a point lower than it was months ago but he has a complicated oncological history for which I also reviewed some notes written by Jay Morris.  In the absence of an emergent medical condition requiring hospitalization, the patient's needs will be best served by close follow-up in the heme/onc  clinic and infusion clinic to make sure he gets exactly what he needs, including possible blood transfusion versus iron  infusion, rather than emergent blood tonight, particularly since he is asymptomatic from his anemia.  I talked with him and his wife about this and they understand and agree.  I put in an urgent referral to hematology/oncology and recommended close outpatient follow-up with surgery as well.  The patient would prefer to see a different surgeon and then Jay Morris so I provided follow-up information with Jay Morris.  I had my usual and customary hemorrhoid management discussion with the patient and his wife and they understand and agree with the plan.  I gave my usual return precautions         FINAL CLINICAL IMPRESSION(S) / ED DIAGNOSES   Final diagnoses:  None     Rx / DC Orders   ED Discharge Orders     None        Note:  This document was prepared using Dragon voice recognition software and may include unintentional dictation errors.   Gordan Huxley, MD 02/13/24 [phone]

## 2024-02-13 NOTE — Discharge Instructions (Signed)
 As we discussed, we encourage you to call to schedule follow-up appointment both with your primary care provider and with Dr. Lane or one of his colleagues in the surgery clinic.  They may have additional options available to help you with your hemorrhoids.  In the meantime, please read through the included information and try using sitz baths.  Try using the prescription we provided according to the label instructions, as well as picking up an over-the-counter hemorrhoid remedy such as Doctor Butler's Hemorrhoid & Fissure Ointment (available on [email or web address]), or any other hemorrhoid ointment or cream that works for you.  It is also important that you follow-up with Dr. Jacobo.  It may be time for you to get additional treatment for your anemia, both due to your chronic issues and the more recent blood loss from the hemorrhoid.  We provided a referral order to try and help get you into the clinic sooner, but you may want to call his office to schedule a follow-up appointment.    Return to the emergency department if you develop new or worsening symptoms that concern you.

## 2024-03-01 ENCOUNTER — Encounter: Payer: Self-pay | Admitting: Surgery

## 2024-03-01 ENCOUNTER — Ambulatory Visit: Payer: Self-pay | Admitting: Surgery

## 2024-03-01 ENCOUNTER — Encounter: Payer: Self-pay | Admitting: Oncology

## 2024-03-01 VITALS — BP 117/54 | HR 65 | Ht 67.0 in | Wt 196.0 lb

## 2024-03-01 DIAGNOSIS — K641 Second degree hemorrhoids: Secondary | ICD-10-CM

## 2024-03-01 DIAGNOSIS — K6289 Other specified diseases of anus and rectum: Secondary | ICD-10-CM | POA: Diagnosis not present

## 2024-03-01 DIAGNOSIS — K644 Residual hemorrhoidal skin tags: Secondary | ICD-10-CM | POA: Insufficient documentation

## 2024-03-01 NOTE — Progress Notes (Signed)
 Patient ID: Jay Morris, male   DOB: May 15, 1946, 78 y.o.   MRN: [id number]  Chief Complaint: Hemorrhoidal bleeding  History of Present Illness Jay Morris is a 78 y.o. male with a history of hemorrhoids for many years, had surgery in 2022, prefers not to follow-up with prior surgeon.  Reports 2 months of noting blood with bowel activity, and associated pain with bowel movement that causes him to desire to avoid bowel activity.  He reports he moves once daily.  Has a small amount of blood with sitz bath/wiping and may be a spot between bowel movements but no bleeding between.  They have reduced his twice daily Eliquis  to once daily.  He senses a reflexive spastic pain with attempt to move his bowels as though he might have a fissure.  However I do not find this on exam.  His pain seems to be more internal, external hydrocortisone  cream has been helpful per their report.  He indicates it is also more of a burning and itching rather than just an anal pain.  His stools are essentially unformed, varying from liquid to this unformed nature.  Last colonoscopy maybe 7 years ago.  He is following up with hematology regarding his anemia.   Past Medical History Past Medical History:  Diagnosis Date   (HFpEF) heart failure with preserved ejection fraction (HCC)    a.) TTE 04/01/2016: EF 60-65%, LAE, MAC, mild MR/AR/TR, mild-mod PR, G2DD; b.) TTE 12/18/2018: EF 55%, mild LVH, mild AR/MR/TR. mod PR; c.) TTE 05/31/2019: EF 60-65%, mild LVH, RAE/RVE, triv AR.MT, PR, mild TR; d.) TTE 10/26/2019: EF 55%, BAE, RVE, triv AR, mild MR/PR, sev TR, G2DD; e.) R/LHC 01/17/2020: mPA 52, PCWP 25, LVEDP 19, RVEDP 25, mRA 13   Anemia    Aortic atherosclerosis (HCC)    Asthma    BPH (benign prostatic hyperplasia)    CKD (chronic kidney disease), stage III (HCC)    COPD (chronic obstructive pulmonary disease) (HCC)    Coronary artery disease 01/17/2020   a.) LHC 01/17/2020: 25% oLAD, 65% o-pRCA, 45% pLAD, 65% mLAD, 35%  OM2 --> med mgmt.   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   PAF (paroxysmal atrial fibrillation) (HCC)    a.) CHA2DS2-VASc = 6 (age x 2, HFpEF, HTN, vascular disease, T2DM);  b.) cardiac ablation in approx 2014-2015 at [facility]; c.) A.fib recurrence in setting of SARS-CoV-2 --> Tx'd with IV amiodarone  + metoprolol  + DCCV x 1 (200 J) 06/22/2019 restoring NSR; d.) rate/rhythm maintained without pharmacological intervention; chronically anticoagulated using standard dose apixaban    Peripheral vascular disease (HCC)    Phimosis of penis    Pneumonia due to COVID-19 virus 06/02/2019   a.) hospitalized at [facility]) from 06/02/2019 - 06/25/2019; Tx'd with steriods + remdesivir  + convalescent plasma   Pneumonia due to COVID-19 virus 05/27/2022   a.) hospitalized at [facility] from 05/27/2022 - 05/30/2022 for acute on chronic respiratory failure secondary to SARS-CoV-2 PNA ; Tx'd with IV steroids + remdesivir   Pulmonary fibrosis (HCC)    Pulmonary HTN (HCC) 04/01/2016   a.) TTE 04/01/2016: EF 60-65%, RVSP 40; b.) TTE 12/18/2018: EF 55%, RVSP 45.9; c.) TTE 05/31/2019: EF 60-65%, RVSP 35.5; d.) TTE 10/26/2019: EF 55%, RVSP 100.4; e.) R/LHC 01/17/2020: mPA 52, PCWP 25, LVEDP 19, RVEDP 25, mRA 13; f.) Tx with PDE5i (sildenafil ) + vasodilator (treprostinil )   Type 2 diabetes mellitus treated with insulin  [facility])       Past Surgical History:  Procedure Laterality Date   BACK SURGERY     x3 L3-L4    CARDIAC ELECTROPHYSIOLOGY MAPPING AND ABLATION N/A    performed  at [facility] in approximately 2014-2015   CARDIOVERSION N/A 06/22/2019   Procedure: CARDIOVERSION;  Surgeon: Okey Vina GAILS, MD;  Location: [facility] ENDOSCOPY;  Service: Cardiovascular;  Laterality: N/A;   COLONOSCOPY WITH PROPOFOL  N/A 06/15/2017   Procedure: COLONOSCOPY WITH PROPOFOL ;  Surgeon: Toledo, Ladell POUR, MD;  Location: [facility] ENDOSCOPY;  Service: Gastroenterology;  Laterality: N/A;   DORSAL SLIT N/A 12/21/2022   Procedure: DORSAL SLIT;  Surgeon: Twylla Glendia BROCKS, MD;  Location: [facility] ORS;  Service: Urology;  Laterality: N/A;   ENTEROSCOPY N/A 11/24/2017   Procedure: ENTEROSCOPY;  Surgeon: Therisa Bi, MD;  Location: [facility] ENDOSCOPY;  Service: Gastroenterology;  Laterality: N/A;   ESOPHAGOGASTRODUODENOSCOPY N/A 11/27/2017   Procedure: ESOPHAGOGASTRODUODENOSCOPY (EGD);  Surgeon: Jinny Carmine, MD;  Location: [facility] ENDOSCOPY;  Service: Endoscopy;  Laterality: N/A;   ESOPHAGOGASTRODUODENOSCOPY (EGD) WITH PROPOFOL  N/A 06/02/2017   Procedure: ESOPHAGOGASTRODUODENOSCOPY (EGD) WITH PROPOFOL ;  Surgeon: Toledo, Ladell POUR, MD;  Location: [facility] ENDOSCOPY;  Service: Gastroenterology;  Laterality: N/A;   ESOPHAGOGASTRODUODENOSCOPY (EGD) WITH PROPOFOL  N/A 08/04/2021   Procedure: ESOPHAGOGASTRODUODENOSCOPY (EGD) WITH PROPOFOL ;  Surgeon: Maryruth Ole DASEN, MD;  Location: [facility] ENDOSCOPY;  Service: Endoscopy;  Laterality: N/A;   EVALUATION UNDER ANESTHESIA WITH HEMORRHOIDECTOMY N/A 02/12/2022   Procedure: EXAM UNDER ANESTHESIA WITH HEMORRHOIDECTOMY;  Surgeon: Tye Millet, DO;  Location: [facility] ORS;  Service: General;  Laterality: N/A;   GIVENS CAPSULE STUDY N/A 11/25/2017   Procedure: GIVENS CAPSULE STUDY;  Surgeon: Therisa Bi, MD;  Location: [facility] ENDOSCOPY;  Service: Gastroenterology;  Laterality: N/A;   HAND SURGERY     RIGHT/LEFT HEART CATH AND CORONARY ANGIOGRAPHY N/A 01/17/2020   Procedure: RIGHT/LEFT HEART CATH AND CORONARY ANGIOGRAPHY;  Surgeon: Hester Wolm PARAS, MD;  Location: [facility] INVASIVE CV LAB;  Service:  Cardiovascular;  Laterality: N/A;    Allergies  Allergen Reactions   Shrimp [Shellfish Allergy] Anaphylaxis   Shrimp Extract     Current Outpatient Medications  Medication Sig Dispense Refill   acetaminophen  (TYLENOL ) 500 MG tablet Take 500-1,000 mg by mouth every 6 (six) hours as needed for moderate pain or headache. 30 tablet 0   allopurinol  (ZYLOPRIM ) 100 MG tablet Take 200 mg by mouth daily.     apixaban  (ELIQUIS ) 5 MG TABS tablet Take 5 mg by mouth daily.     ascorbic acid  (VITAMIN C ) 500 MG tablet Take 500 mg by mouth daily.     atorvastatin  (LIPITOR) 20 MG tablet Take 20 mg by mouth at bedtime.      calcitRIOL  (ROCALTROL ) 0.25 MCG capsule Take 0.25 mcg by mouth daily.     Cholecalciferol  (DIALYVITE VITAMIN D  5000) 125 MCG (5000 UT) capsule Take 5,000 Units by mouth daily.     colchicine  0.6 MG tablet Take 0.6 mg by mouth daily.     cyclobenzaprine  (FLEXERIL ) 5 MG tablet Take 1 tablet (5 mg total) by mouth 3 (three) times daily as needed. 12  tablet 0   finasteride  (PROSCAR ) 5 MG tablet TAKE 1 TABLET BY MOUTH DAILY (Patient taking differently: Take 5 mg by mouth daily.) 30 tablet 11   fluticasone  furoate-vilanterol (BREO ELLIPTA ) 100-25 MCG/ACT AEPB Inhale 1 puff into the lungs daily.     furosemide  (LASIX ) 20 MG tablet Take 20 mg by mouth daily.     HYDROcodone -acetaminophen  (NORCO/VICODIN) 5-325 MG tablet Take 1 tablet by mouth every 4 (four) hours as needed.     hydrocortisone -pramoxine (PROCTOFOAM  HC) rectal foam Place 1 applicator rectally 2 (two) times daily. 10 g 1   insulin  aspart (NOVOLOG ) 100 UNIT/ML FlexPen Inject 35-45 Units into the skin 3 (three) times daily with meals. (Based upon blood glucose reading)     insulin  degludec (TRESIBA  FLEXTOUCH) 200 UNIT/ML FlexTouch Pen Inject 80 Units into the skin at bedtime.     lidocaine  (LIDODERM ) 5 % Place 1 patch onto the skin every 12 (twelve) hours. Remove & Discard patch within 12 hours or as directed by MD 10 patch 0    lidocaine  (XYLOCAINE ) 5 % ointment Apply 1 Application topically 3 (three) times daily as needed. 35 g 0   lisinopril  (ZESTRIL ) 5 MG tablet Take 5 mg by mouth daily.     magnesium  oxide (MAG-OX) 400 MG tablet Take 400 mg by mouth 3 (three) times daily.     metoprolol  tartrate (LOPRESSOR ) 50 MG tablet Take 50 mg by mouth 2 (two) times daily.     nystatin -triamcinolone  ointment (MYCOLOG) Apply 1 Application topically 2 (two) times daily. (Patient taking differently: Apply 1 Application topically as needed.) 30 g 2   pantoprazole  (PROTONIX ) 40 MG tablet Take 1 tablet (40 mg total) by mouth 2 (two) times daily before a meal. 60 tablet 0   sildenafil  (REVATIO ) 20 MG tablet Take 20 mg by mouth 3 (three) times daily.     tamsulosin  (FLOMAX ) 0.4 MG CAPS capsule Take 0.4 mg by mouth daily after supper.     Treprostinil  (TYVASO ) 0.6 MG/ML SOLN Inhale 18 mcg into the lungs daily. (Patient not taking: Reported on 11/11/2023)     vitamin B-12 (CYANOCOBALAMIN ) 1000 MCG tablet Take 1,000 mcg by mouth daily.     No current facility-administered medications for this visit.    Family History Family History  Problem Relation Age of Onset   Heart disease Mother    Cancer Father    Cancer Paternal Grandfather       Social History Social History   Tobacco Use   Smoking status: Former    Current packs/day: 0.00    Average packs/day: 1 pack/day for 30.0 years (30.0 ttl pk-yrs)    Types: Cigarettes    Start date: 60    Quit date: 1995    Years since quitting: 30.6   Smokeless tobacco: Never  Vaping Use   Vaping status: Never Used  Substance Use Topics   Alcohol use: No    Alcohol/week: 0.0 standard drinks of alcohol    Comment: occasional   Drug use: No        Review of Systems  Cardiovascular:  Positive for leg swelling.  Gastrointestinal:  Positive for blood in stool, diarrhea and melena.  All other systems reviewed and are negative.    Physical Exam Weight 196 lb (88.9 kg). Last  Weight  Most recent update: 03/01/2024 10:27 AM    Weight  88.9 kg (196 lb)             CONSTITUTIONAL: Well developed, and nourished, appropriately responsive  and aware without distress.  Nasal cannula oxygen being given with O2 concentrator. EYES: Sclera non-icteric.   EARS, NOSE, MOUTH AND THROAT:  The oropharynx is clear. Oral mucosa is pink and moist.     Hearing is intact to voice.  NECK: Trachea is midline, and there is no jugular venous distension.  LYMPH NODES:  Lymph nodes in the neck are not appreciated. RESPIRATORY:   Normal respiratory effort without pathologic use of accessory muscles. CARDIOVASCULAR:  Well perfused.  GI: The abdomen is  soft, nontender, and nondistended.  GU: Digital rectal exam completed.  There are more external hemorrhoids on the left approximately three quarters of the left hemisphere.  There are 2 smaller external hemorrhoidal tags on the right.  Neither of these appear to be inflamed or a source of bleeding at all at this point.  DRE completed with diffuse circumferential internal hemorrhoidal tissue.  He reports pain on exam but I do not identify any fissure, gross blood, irregularity or scarring to indicate the source of the pain.  Clearly no well-defined internal hemorrhoidal piles to pursue. MUSCULOSKELETAL:  Symmetrical muscle tone appreciated in all four extremities.    SKIN: Skin turgor is normal. No pathologic skin lesions appreciated.  NEUROLOGIC:  Motor and sensation appear grossly normal.  Cranial nerves are grossly without defect. PSYCH:  Alert and oriented to person, place and time. Affect is appropriate for situation.  Data Reviewed I have personally reviewed what is currently available of the patient's imaging, recent labs and medical records.   Labs:     Latest Ref Rng & Units 02/12/2024   11:36 PM 11/11/2023   10:36 AM 07/07/2023    2:02 PM  CBC  WBC 4.0 - 10.5 K/uL 10.2  7.8  8.8   Hemoglobin 13.0 - 17.0 g/dL 8.3  9.5  89.9    Hematocrit 39.0 - 52.0 % 26.7  29.5  31.2   Platelets 150 - 400 K/uL 234  216  192       Latest Ref Rng & Units 02/12/2024   11:36 PM 06/04/2023   11:50 AM 01/10/2023    4:36 AM  CMP  Glucose 70 - 99 mg/dL 882  887  750   BUN 8 - 23 mg/dL 46  31  48   Creatinine 0.61 - 1.24 mg/dL 7.04  7.86  7.60   Sodium 135 - 145 mmol/L 142  139  137   Potassium 3.5 - 5.1 mmol/L 4.3  3.3  4.7   Chloride 98 - 111 mmol/L 111  108  103   CO2 22 - 32 mmol/L 20  22  25    Calcium  8.9 - 10.3 mg/dL 9.0  7.6  9.2       Imaging: Radiological images personally reviewed:   Within last 24 hrs: No results found.  Assessment    Anal pain, and minimal persistent anal bleeding while patient is taking Eliquis .  Somewhat improved with topical hydrocortisone .  Has not trialed RectiCare advanced at this point. 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 01/07/2023   Complicated urinary tract infection 01/07/2023   Acute on chronic respiratory failure with hypoxia and hypercapnia (HCC) 01/07/2023   Atrial fibrillation (HCC) 07/22/2022   Overweight (BMI 25.0-29.9) 05/28/2022   Acute on chronic  respiratory failure with hypoxia (HCC) 05/27/2022   Pneumonia due to COVID-19 virus 05/27/2022   Sepsis due to pneumonia (HCC) 02/13/2022   BPH (benign prostatic hyperplasia) 02/13/2022   Chronic obstructive pulmonary disease (COPD) (HCC) 02/13/2022   GERD without esophagitis 02/13/2022   Anemia in stage 4 chronic kidney disease (HCC) 12/22/2021   GI bleeding 08/03/2021   Chronic diastolic CHF (congestive heart failure) (HCC) 08/03/2021   HTN (hypertension) 08/03/2021   Atrial fibrillation, chronic (HCC) 08/03/2021   CKD (chronic kidney disease), stage IV (HCC) 08/03/2021    CAD (coronary artery disease) 08/03/2021   Leukocytosis 08/03/2021   Gout 01/08/2021   Hearing loss 01/08/2021   History of severe acute respiratory syndrome coronavirus 2 (SARS-CoV-2) disease 01/08/2021   Vitamin D  deficiency 01/08/2021   Benign prostatic hyperplasia 12/31/2020   Anemia in chronic kidney disease 12/31/2020   Heart failure, unspecified (HCC) 12/31/2020   CKD (chronic kidney disease) stage 3, GFR 30-59 ml/min (HCC) 01/28/2020   Acute on chronic systolic heart failure (HCC) 01/16/2020   Pulmonary hypertension, moderate to severe (HCC) 11/26/2019   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/17/2019   Anemia, unspecified 09/01/2019   Acute on chronic diastolic CHF (congestive heart failure), NYHA class 3 (HCC) 08/16/2019   PVD (peripheral vascular disease) (HCC) 06/03/2019   Hepatic steatosis 06/03/2019   Acute renal failure superimposed on stage 3a chronic kidney disease (HCC) 06/01/2019   COPD with acute exacerbation (HCC) 06/01/2019   Muscle weakness (generalized) 06/01/2019   COVID-19 virus infection 06/01/2019   AKI (acute kidney injury) (HCC) 05/31/2019   Acute respiratory failure with hypoxia (HCC)    Vitamin B12 deficiency 05/08/2018   Type II diabetes mellitus with renal manifestations (HCC) 02/08/2018   At risk for bleeding 12/15/2017   Hypotension 12/01/2017   Iron  deficiency anemia due to chronic blood loss    GI bleed 11/21/2017   Coronary artery disease involving native coronary artery of native heart 11/16/2017   Uncontrolled type 2 diabetes mellitus with hyperglycemia, with long-term current use of insulin  (HCC) 11/24/2016   Chronic gouty arthritis 04/16/2016   COPD, moderate (HCC) 02/09/2016   Pneumonia 01/04/2016   Degeneration of lumbar intervertebral disc 10/28/2015   Paroxysmal atrial fibrillation with RVR (HCC) 05/21/2014   Hyperlipidemia 05/21/2014    Plan    I believe this is a pain and bleeding that may be best managed  medically I do not have a good surgical option to resolve the pain at this point.  We will trial RectiCare advanced, have him follow-up with hematology and I will be glad to see him back in a month to see what progress he makes.  I personally spent a total of 60 minutes in the care of the patient today including preparing to see the patient, getting/reviewing separately obtained history, performing a medically appropriate exam/evaluation, counseling and educating, and documenting clinical information in the EHR.   These notes generated with voice recognition software. I apologize for typographical errors.  Honor Leghorn M.D., FACS 03/01/2024, 10:28 AM

## 2024-03-01 NOTE — Patient Instructions (Addendum)
 Continue to use the Hydrocortisone  Cream after each Bowel movement.  You may also use Recti Care to the area as needed, this is a Lidocaine  cream for numbing the pain.   See Hematology to review possible causes of your anemia.   We will have you follow up here in one month.    Hemorrhoids Hemorrhoids are swollen veins that may form: In the butt (rectum). These are called internal hemorrhoids. Around the opening of the butt (anus). These are called external hemorrhoids. Most hemorrhoids do not cause very bad problems. They often get better with changes to your lifestyle and what you eat. What are the causes? Having trouble pooping (constipation) or watery poop (diarrhea). Pushing too hard when you poop. Pregnancy. Being very overweight (obese). Sitting for too long. Riding a bike for a long time. Heavy lifting or other things that take a lot of effort. Anal sex. What are the signs or symptoms? Pain. Itching or soreness in the butt. Bleeding from the butt. Leaking poop. Swelling. One or more lumps around the opening of your butt. How is this treated? In most cases, hemorrhoids can be treated at home. You may be told to: Change what you eat. Make changes to your lifestyle. If these treatments do not help, you may need to have a procedure done. Your doctor may need to: Place rubber bands at the bottom of the hemorrhoids to make them fall off. Put medicine into the hemorrhoids to shrink them. Shine a type of light on the hemorrhoids to cause them to fall off. Do surgery to get rid of the hemorrhoids. Follow these instructions at home: Medicines Take over-the-counter and prescription medicines only as told by your doctor. Use creams with medicine in them or medicines that you put in your butt as told by your doctor. Eating and drinking  Eat foods that have a lot of fiber in them. These include whole grains, beans, nuts, fruits, and vegetables. Ask your doctor about taking  products that have fiber added to them (fibersupplements). Take in less fat. You can do this by: Eating low-fat dairy products. Eating less red meat. Staying away from processed foods. Drink enough fluid to keep your pee (urine) pale yellow. Managing pain and swelling  Take a warm-water bath (sitz bath) for 20 minutes to ease pain. Do this 3-4 times a day. You may do this in a bathtub. You may also use a portable sitz bath that fits over the toilet. If told, put ice on the painful area. It may help to use ice between your warm baths. Put ice in a plastic bag. Place a towel between your skin and the bag. Leave the ice on for 20 minutes, 2-3 times a day. If your skin turns bright red, take off the ice right away to prevent skin damage. The risk of damage is higher if you cannot feel pain, heat, or cold. General instructions Exercise. Ask your doctor how much and what kind of exercise is best for you. Go to the bathroom when you need to poop. Do not wait. Try not to push too hard when you poop. Keep your butt dry and clean. Use wet toilet paper or moist towelettes after you poop. Do not sit on the toilet for a long time. Contact a doctor if: You have pain and swelling that do not get better with treatment. You have trouble pooping. You cannot poop. You have pain or swelling outside the area of the hemorrhoids. Get help right away if: You  have bleeding from the butt that will not stop. This information is not intended to replace advice given to you by your health care provider. Make sure you discuss any questions you have with your health care provider. Document Revised: 03/17/2022 Document Reviewed: 03/17/2022 Elsevier Patient Education  2024 [employer].

## 2024-03-05 ENCOUNTER — Encounter: Payer: Self-pay | Admitting: Oncology

## 2024-03-05 ENCOUNTER — Other Ambulatory Visit: Payer: Self-pay

## 2024-03-05 ENCOUNTER — Inpatient Hospital Stay

## 2024-03-05 ENCOUNTER — Inpatient Hospital Stay: Attending: Oncology

## 2024-03-05 ENCOUNTER — Inpatient Hospital Stay (HOSPITAL_BASED_OUTPATIENT_CLINIC_OR_DEPARTMENT_OTHER): Admitting: Oncology

## 2024-03-05 VITALS — BP 130/59 | HR 80 | Temp 98.3°F | Resp 16 | Ht 67.0 in | Wt 197.0 lb

## 2024-03-05 DIAGNOSIS — D649 Anemia, unspecified: Secondary | ICD-10-CM

## 2024-03-05 DIAGNOSIS — D631 Anemia in chronic kidney disease: Secondary | ICD-10-CM | POA: Diagnosis present

## 2024-03-05 DIAGNOSIS — N184 Chronic kidney disease, stage 4 (severe): Secondary | ICD-10-CM | POA: Insufficient documentation

## 2024-03-05 LAB — CMP (CANCER CENTER ONLY)
ALT: 16 U/L (ref 0–44)
AST: 15 U/L (ref 15–41)
Albumin: 3.8 g/dL (ref 3.5–5.0)
Alkaline Phosphatase: 42 U/L (ref 38–126)
Anion gap: 9 (ref 5–15)
BUN: 31 mg/dL — ABNORMAL HIGH (ref 8–23)
CO2: 21 mmol/L — ABNORMAL LOW (ref 22–32)
Calcium: 8.9 mg/dL (ref 8.9–10.3)
Chloride: 107 mmol/L (ref 98–111)
Creatinine: 2.38 mg/dL — ABNORMAL HIGH (ref 0.61–1.24)
GFR, Estimated: 27 mL/min — ABNORMAL LOW (ref >60)
Glucose, Bld: 123 mg/dL — ABNORMAL HIGH (ref 70–99)
Potassium: 3.6 mmol/L (ref 3.5–5.1)
Sodium: 137 mmol/L (ref 135–145)
Total Bilirubin: 0.8 mg/dL (ref 0.0–1.2)
Total Protein: 6.7 g/dL (ref 6.5–8.1)

## 2024-03-05 LAB — CBC WITH DIFFERENTIAL (CANCER CENTER ONLY)
Abs Immature Granulocytes: 0.01 K/uL (ref 0.00–0.07)
Basophils Absolute: 0 K/uL (ref 0.0–0.1)
Basophils Relative: 0 %
Eosinophils Absolute: 0.2 K/uL (ref 0.0–0.5)
Eosinophils Relative: 2 %
HCT: 24.1 % — ABNORMAL LOW (ref 39.0–52.0)
Hemoglobin: 7.5 g/dL — ABNORMAL LOW (ref 13.0–17.0)
Immature Granulocytes: 0 %
Lymphocytes Relative: 22 %
Lymphs Abs: 1.7 K/uL (ref 0.7–4.0)
MCH: 31.4 pg (ref 26.0–34.0)
MCHC: 31.1 g/dL (ref 30.0–36.0)
MCV: 100.8 fL — ABNORMAL HIGH (ref 80.0–100.0)
Monocytes Absolute: 0.6 K/uL (ref 0.1–1.0)
Monocytes Relative: 8 %
Neutro Abs: 5.1 K/uL (ref 1.7–7.7)
Neutrophils Relative %: 68 %
Platelet Count: 211 K/uL (ref 150–400)
RBC: 2.39 MIL/uL — ABNORMAL LOW (ref 4.22–5.81)
RDW: 16.9 % — ABNORMAL HIGH (ref 11.5–15.5)
WBC Count: 7.6 K/uL (ref 4.0–10.5)
nRBC: 0 % (ref 0.0–0.2)

## 2024-03-05 MED ORDER — IRON SUCROSE 20 MG/ML IV SOLN
200.0000 mg | Freq: Once | INTRAVENOUS | Status: AC
  Administered 2024-03-05: 200 mg via INTRAVENOUS
  Filled 2024-03-05: qty 10

## 2024-03-05 NOTE — Progress Notes (Signed)
 [facility]  [phone] [phone]  ID: Jay Morris OB: [birth year]  MR#: [id number]  RDW#:[id number]  Patient Care Team: Auston Reyes BIRCH, MD as PCP - General (Internal Medicine) Jacobo Evalene PARAS, MD as Consulting Physician (Oncology)  CHIEF COMPLAINT: Anemia, unspecified.  INTERVAL HISTORY: Patient returns to clinic today for repeat laboratory work and further evaluation.  He has had significant pain and bleeding from hemorrhoids recently and has an appointment with surgery in the near future.  He has otherwise felt well.  He continues to have chronic shortness of breath and requires oxygen 24 hours a day.  He does not complain of weakness or fatigue today.  He has no neurologic complaints.  He denies any recent fevers or illnesses.  He has a good appetite and denies weight loss.  He denies any chest pain, cough, or hemoptysis.  He denies any nausea, vomiting, constipation, or diarrhea. He has no urinary complaints.  Patient offers no further specific complaints today.  REVIEW OF SYSTEMS:   Review of Systems  Constitutional: Negative.  Negative for fever, malaise/fatigue and weight loss.  Respiratory:  Positive for shortness of breath. Negative for cough and hemoptysis.   Cardiovascular: Negative.  Negative for chest pain and leg swelling.  Gastrointestinal: Negative.  Negative for abdominal pain, blood in stool and melena.  Genitourinary: Negative.  Negative for hematuria.  Musculoskeletal: Negative.  Negative for back pain.  Skin: Negative.  Negative for rash.  Neurological: Negative.  Negative for dizziness, focal weakness, weakness and headaches.  Psychiatric/Behavioral: Negative.  The patient is not nervous/anxious.     As per HPI. Otherwise, a complete review of systems is negative.  PAST MEDICAL HISTORY: Past Medical History:  Diagnosis Date   (HFpEF) heart failure with preserved ejection fraction (HCC)    a.) TTE 04/01/2016: EF  60-65%, LAE, MAC, mild MR/AR/TR, mild-mod PR, G2DD; b.) TTE 12/18/2018: EF 55%, mild LVH, mild AR/MR/TR. mod PR; c.) TTE 05/31/2019: EF 60-65%, mild LVH, RAE/RVE, triv AR.MT, PR, mild TR; d.) TTE 10/26/2019: EF 55%, BAE, RVE, triv AR, mild MR/PR, sev TR, G2DD; e.) R/LHC 01/17/2020: mPA 52, PCWP 25, LVEDP 19, RVEDP 25, mRA 13   Anemia    Aortic atherosclerosis (HCC)    Asthma    BPH (benign prostatic hyperplasia)    CKD (chronic kidney disease), stage III (HCC)    COPD (chronic obstructive pulmonary disease) (HCC)    Coronary artery disease 01/17/2020   a.) LHC 01/17/2020: 25% oLAD, 65% o-pRCA, 45% pLAD, 65% mLAD, 35% OM2 --> med mgmt.   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   PAF (paroxysmal atrial fibrillation) (HCC)    a.) CHA2DS2-VASc = 6 (age x 2, HFpEF, HTN, vascular disease, T2DM);  b.) cardiac ablation in approx 2014-2015 at [facility]; c.) A.fib recurrence in setting of SARS-CoV-2 --> Tx'd with IV amiodarone  + metoprolol  + DCCV x 1 (200 J) 06/22/2019 restoring NSR; d.) rate/rhythm maintained without pharmacological intervention; chronically anticoagulated using standard dose apixaban    Peripheral vascular disease (HCC)  Phimosis of penis    Pneumonia due to COVID-19 virus 06/02/2019   a.) hospitalized at [facility]) from 06/02/2019 - 06/25/2019; Tx'd with steriods + remdesivir  + convalescent plasma   Pneumonia due to COVID-19 virus 05/27/2022   a.) hospitalized at [facility] from 05/27/2022 - 05/30/2022 for acute on chronic respiratory failure  secondary to SARS-CoV-2 PNA ; Tx'd with IV steroids + remdesivir    Pulmonary fibrosis (HCC)    Pulmonary HTN (HCC) 04/01/2016   a.) TTE 04/01/2016: EF 60-65%, RVSP 40; b.) TTE 12/18/2018: EF 55%, RVSP 45.9; c.) TTE 05/31/2019: EF 60-65%, RVSP 35.5; d.) TTE 10/26/2019: EF 55%, RVSP 100.4; e.) R/LHC 01/17/2020: mPA 52, PCWP 25, LVEDP 19, RVEDP 25, mRA 13; f.) Tx with PDE5i (sildenafil ) + vasodilator (treprostinil )   Type 2 diabetes mellitus treated with insulin  (HCC)     PAST SURGICAL HISTORY: Past Surgical History:  Procedure Laterality Date   BACK SURGERY     x3 L3-L4    CARDIAC ELECTROPHYSIOLOGY MAPPING AND ABLATION N/A    performed at [facility] in approximately 2014-2015   CARDIOVERSION N/A 06/22/2019   Procedure: CARDIOVERSION;  Surgeon: Okey Vina GAILS, MD;  Location: [facility] ENDOSCOPY;  Service: Cardiovascular;  Laterality: N/A;   COLONOSCOPY WITH PROPOFOL  N/A 06/15/2017   Procedure: COLONOSCOPY WITH PROPOFOL ;  Surgeon: Toledo, Ladell POUR, MD;  Location: [facility] ENDOSCOPY;  Service: Gastroenterology;  Laterality: N/A;   DORSAL SLIT N/A 12/21/2022   Procedure: DORSAL SLIT;  Surgeon: Twylla Glendia BROCKS, MD;  Location: [facility] ORS;  Service: Urology;  Laterality: N/A;   ENTEROSCOPY N/A 11/24/2017   Procedure: ENTEROSCOPY;  Surgeon: Therisa Bi, MD;  Location: [facility] ENDOSCOPY;  Service: Gastroenterology;  Laterality: N/A;   ESOPHAGOGASTRODUODENOSCOPY N/A 11/27/2017   Procedure: ESOPHAGOGASTRODUODENOSCOPY (EGD);  Surgeon: Jinny Carmine, MD;  Location: [facility] ENDOSCOPY;  Service: Endoscopy;  Laterality: N/A;   ESOPHAGOGASTRODUODENOSCOPY (EGD) WITH PROPOFOL  N/A 06/02/2017   Procedure: ESOPHAGOGASTRODUODENOSCOPY (EGD) WITH PROPOFOL ;  Surgeon: Toledo, Ladell POUR, MD;  Location: [facility] ENDOSCOPY;  Service: Gastroenterology;  Laterality: N/A;   ESOPHAGOGASTRODUODENOSCOPY (EGD) WITH PROPOFOL  N/A 08/04/2021   Procedure: ESOPHAGOGASTRODUODENOSCOPY (EGD) WITH PROPOFOL ;  Surgeon: Maryruth Ole DASEN, MD;  Location: [facility]  ENDOSCOPY;  Service: Endoscopy;  Laterality: N/A;   EVALUATION UNDER ANESTHESIA WITH HEMORRHOIDECTOMY N/A 02/12/2022   Procedure: EXAM UNDER ANESTHESIA WITH HEMORRHOIDECTOMY;  Surgeon: Tye Millet, DO;  Location: [facility] ORS;  Service: General;  Laterality: N/A;   GIVENS CAPSULE STUDY N/A 11/25/2017   Procedure: GIVENS CAPSULE STUDY;  Surgeon: Therisa Bi, MD;  Location: [facility] ENDOSCOPY;  Service: Gastroenterology;  Laterality: N/A;   HAND SURGERY     RIGHT/LEFT HEART CATH AND CORONARY ANGIOGRAPHY N/A 01/17/2020   Procedure: RIGHT/LEFT HEART CATH AND CORONARY ANGIOGRAPHY;  Surgeon: Hester Wolm PARAS, MD;  Location: [facility] INVASIVE CV LAB;  Service: Cardiovascular;  Laterality: N/A;    FAMILY HISTORY: Family History  Problem Relation Age of Onset   Heart disease Mother    Cancer Father    Cancer Paternal Grandfather     ADVANCED DIRECTIVES (Y/N):  N  HEALTH MAINTENANCE: Social History   Tobacco Use   Smoking status: Former    Current packs/day: 0.00    Average packs/day: 1 pack/day for 30.0 years (30.0 ttl pk-yrs)    Types: Cigarettes    Start date: 61    Quit date: 1995    Years since quitting: 30.6    Passive exposure: Past   Smokeless tobacco: Never  Vaping Use   Vaping status: Never Used  Substance  Use Topics   Alcohol use: No    Alcohol/week: 0.0 standard drinks of alcohol    Comment: occasional   Drug use: No     Colonoscopy:  PAP:  Bone density:  Lipid panel:  Allergies  Allergen Reactions   Shrimp [Shellfish Allergy] Anaphylaxis   Shrimp Extract     Current Outpatient Medications  Medication Sig Dispense Refill   acetaminophen  (TYLENOL ) 500 MG tablet Take 500-1,000 mg by mouth every 6 (six) hours as needed for moderate pain or headache. 30 tablet 0   allopurinol  (ZYLOPRIM ) 100 MG tablet Take 200 mg by mouth daily.     apixaban  (ELIQUIS ) 5 MG TABS tablet Take 5 mg by mouth daily.     ascorbic acid  (VITAMIN C ) 500 MG tablet Take 500 mg by mouth daily.      atorvastatin  (LIPITOR) 20 MG tablet Take 20 mg by mouth at bedtime.      calcitRIOL  (ROCALTROL ) 0.25 MCG capsule Take 0.25 mcg by mouth daily.     Cholecalciferol  (DIALYVITE VITAMIN D  5000) 125 MCG (5000 UT) capsule Take 5,000 Units by mouth daily.     colchicine  0.6 MG tablet Take 0.6 mg by mouth daily.     cyclobenzaprine  (FLEXERIL ) 5 MG tablet Take 1 tablet (5 mg total) by mouth 3 (three) times daily as needed. 12 tablet 0   finasteride  (PROSCAR ) 5 MG tablet TAKE 1 TABLET BY MOUTH DAILY (Patient taking differently: Take 5 mg by mouth daily.) 30 tablet 11   fluticasone  furoate-vilanterol (BREO ELLIPTA ) 100-25 MCG/ACT AEPB Inhale 1 puff into the lungs daily.     furosemide  (LASIX ) 20 MG tablet Take 20 mg by mouth daily.     HYDROcodone -acetaminophen  (NORCO/VICODIN) 5-325 MG tablet Take 1 tablet by mouth every 4 (four) hours as needed.     hydrocortisone  2.5 % cream Apply 1 Application topically 2 (two) times daily.     insulin  aspart (NOVOLOG ) 100 UNIT/ML FlexPen Inject 35-45 Units into the skin 3 (three) times daily with meals. (Based upon blood glucose reading)     insulin  degludec (TRESIBA  FLEXTOUCH) 200 UNIT/ML FlexTouch Pen Inject 80 Units into the skin at bedtime.     lidocaine  (XYLOCAINE ) 5 % ointment Apply 1 Application topically 3 (three) times daily as needed. 35 g 0   lisinopril  (ZESTRIL ) 5 MG tablet Take 5 mg by mouth daily.     magnesium  oxide (MAG-OX) 400 MG tablet Take 400 mg by mouth 3 (three) times daily.     metoprolol  tartrate (LOPRESSOR ) 50 MG tablet Take 50 mg by mouth 2 (two) times daily.     nystatin -triamcinolone  ointment (MYCOLOG) Apply 1 Application topically 2 (two) times daily. (Patient taking differently: Apply 1 Application topically as needed.) 30 g 2   pantoprazole  (PROTONIX ) 40 MG tablet Take 1 tablet (40 mg total) by mouth 2 (two) times daily before a meal. 60 tablet 0   sildenafil  (REVATIO ) 20 MG tablet Take 20 mg by mouth 3 (three) times daily.     tamsulosin   (FLOMAX ) 0.4 MG CAPS capsule Take 0.4 mg by mouth daily after supper.     vitamin B-12 (CYANOCOBALAMIN ) 1000 MCG tablet Take 1,000 mcg by mouth daily.     No current facility-administered medications for this visit.    OBJECTIVE: Vitals:   03/05/24 0957  BP: (!) 130/59  Pulse: 80  Resp: 16  Temp: 98.3 F (36.8 C)  SpO2: 94%      Body mass index is 30.85 kg/m.    ECOG  FS:1 - Symptomatic but completely ambulatory  General: Well-developed, well-nourished, no acute distress.  Sitting in a wheelchair. Eyes: Pink conjunctiva, anicteric sclera. HEENT: Normocephalic, moist mucous membranes. Lungs: No audible wheezing or coughing. Heart: Regular rate and rhythm. Abdomen: Soft, nontender, no obvious distention. Musculoskeletal: No edema, cyanosis, or clubbing. Neuro: Alert, answering all questions appropriately. Cranial nerves grossly intact. Skin: No rashes or petechiae noted. Psych: Normal affect.  LAB RESULTS:  Lab Results  Component Value Date   NA 137 03/05/2024   K 3.6 03/05/2024   CL 107 03/05/2024   CO2 21 (L) 03/05/2024   GLUCOSE 123 (H) 03/05/2024   BUN 31 (H) 03/05/2024   CREATININE 2.38 (H) 03/05/2024   CALCIUM  8.9 03/05/2024   PROT 6.7 03/05/2024   ALBUMIN  3.8 03/05/2024   AST 15 03/05/2024   ALT 16 03/05/2024   ALKPHOS 42 03/05/2024   BILITOT 0.8 03/05/2024   GFRNONAA 27 (L) 03/05/2024   GFRAA 39 (L) 10/18/2019    Lab Results  Component Value Date   WBC 7.6 03/05/2024   NEUTROABS 5.1 03/05/2024   HGB 7.5 (L) 03/05/2024   HCT 24.1 (L) 03/05/2024   MCV 100.8 (H) 03/05/2024   PLT 211 03/05/2024   Lab Results  Component Value Date   IRON  60 11/11/2023   TIBC 360 11/11/2023   IRONPCTSAT 17 (L) 11/11/2023   Lab Results  Component Value Date   FERRITIN 66 11/11/2023     STUDIES: No results found.   ASSESSMENT: Anemia, unspecified.  PLAN:   Anemia, unspecified: Bone marrow biopsy completed on June 25, 2020 was essentially negative only  with a hypercellular marrow for age.  Patient was noted to have decreased iron  stores and rare ringed sideroblasts.  FISH and cytogenetics were negative.  He received Venofer  x 5 with his most recent treatment on May 06, 2022.  Repeat iron  stores on July 07, 2023 were essentially within normal limits except for mildly decreased saturation ratio of 16%.  Previously, B12, folate, and hemolysis labs were found to be either negative or within normal limits.  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  Given patient's drop in hemoglobin and active bleeding, we will proceed with 200 mg IV Venofer  today.  Return to clinic 3 times over the next 1 to 2 weeks for additional treatment.  Patient with then return to clinic in 3 months with repeat laboratory work, further evaluation, consideration of additional IV Venofer  or possibly reinitiation of Aranesp .     MGUS: Patient previously noted to have an M spike of 0.3, but repeat laboratory work in October 2023 this was no longer observed.  Both kappa and lambda free light chains are only mildly elevated and immunoglobulins are within normal limits.  Bone marrow biopsy only revealed approximately 1% plasma cells.  Likely clinically insignificant. Chronic renal insufficiency: Chronic and unchanged.  Continue follow-up with nephrology as indicated. Hypertension: Patient's blood pressure is essentially within normal limits today.  Continue monitoring and treatment per primary care.  I spent a total of 30 minutes reviewing chart data, face-to-face evaluation with the patient, counseling and coordination of care as detailed above.   Patient expressed understanding and was in agreement with this plan. He also understands that He can call clinic at any time with any questions, concerns, or complaints.    Evalene JINNY Reusing, MD   03/05/2024 11:07 AM

## 2024-03-05 NOTE — Progress Notes (Signed)
 Patient is having severe pain from his hemorrhoids, which is causing him to have trouble urinating, he saw a specialist last week and they told patient that he wasn't sure what was wrong or what he could do to help.

## 2024-03-07 ENCOUNTER — Inpatient Hospital Stay

## 2024-03-07 VITALS — BP 143/62 | HR 64 | Temp 97.5°F | Resp 18

## 2024-03-07 DIAGNOSIS — N184 Chronic kidney disease, stage 4 (severe): Secondary | ICD-10-CM | POA: Diagnosis not present

## 2024-03-07 DIAGNOSIS — D649 Anemia, unspecified: Secondary | ICD-10-CM

## 2024-03-07 MED ORDER — IRON SUCROSE 20 MG/ML IV SOLN
200.0000 mg | Freq: Once | INTRAVENOUS | Status: AC
  Administered 2024-03-07: 200 mg via INTRAVENOUS
  Filled 2024-03-07: qty 10

## 2024-03-07 NOTE — Patient Instructions (Signed)

## 2024-03-07 NOTE — Progress Notes (Signed)
 Declined post-observation. Aware of risks. Vitals stable at discharge.

## 2024-03-09 ENCOUNTER — Inpatient Hospital Stay

## 2024-03-09 VITALS — BP 132/63 | HR 64 | Temp 97.4°F | Resp 18

## 2024-03-09 DIAGNOSIS — D649 Anemia, unspecified: Secondary | ICD-10-CM

## 2024-03-09 DIAGNOSIS — N184 Chronic kidney disease, stage 4 (severe): Secondary | ICD-10-CM | POA: Diagnosis not present

## 2024-03-09 MED ORDER — IRON SUCROSE 20 MG/ML IV SOLN
200.0000 mg | Freq: Once | INTRAVENOUS | Status: AC
  Administered 2024-03-09: 200 mg via INTRAVENOUS
  Filled 2024-03-09: qty 10

## 2024-03-12 ENCOUNTER — Inpatient Hospital Stay

## 2024-03-12 VITALS — BP 146/64 | HR 88 | Temp 97.6°F | Resp 16

## 2024-03-12 DIAGNOSIS — N184 Chronic kidney disease, stage 4 (severe): Secondary | ICD-10-CM | POA: Diagnosis not present

## 2024-03-12 DIAGNOSIS — D649 Anemia, unspecified: Secondary | ICD-10-CM

## 2024-03-12 MED ORDER — IRON SUCROSE 20 MG/ML IV SOLN
200.0000 mg | Freq: Once | INTRAVENOUS | Status: AC
  Administered 2024-03-12: 200 mg via INTRAVENOUS
  Filled 2024-03-12: qty 10

## 2024-03-12 NOTE — Progress Notes (Signed)
 Declined post-observation. Aware of risks. Vitals stable at discharge.

## 2024-03-12 NOTE — Patient Instructions (Signed)

## 2024-04-05 ENCOUNTER — Ambulatory Visit: Admitting: Surgery

## 2024-05-11 ENCOUNTER — Inpatient Hospital Stay

## 2024-05-11 ENCOUNTER — Encounter: Payer: Self-pay | Admitting: Oncology

## 2024-05-11 ENCOUNTER — Inpatient Hospital Stay (HOSPITAL_BASED_OUTPATIENT_CLINIC_OR_DEPARTMENT_OTHER): Admitting: Oncology

## 2024-05-11 ENCOUNTER — Inpatient Hospital Stay: Attending: Oncology

## 2024-05-11 VITALS — BP 110/40 | HR 62 | Temp 97.0°F | Resp 18 | Ht 67.0 in | Wt 189.0 lb

## 2024-05-11 DIAGNOSIS — D649 Anemia, unspecified: Secondary | ICD-10-CM

## 2024-05-11 DIAGNOSIS — D631 Anemia in chronic kidney disease: Secondary | ICD-10-CM | POA: Insufficient documentation

## 2024-05-11 DIAGNOSIS — N184 Chronic kidney disease, stage 4 (severe): Secondary | ICD-10-CM

## 2024-05-11 LAB — IRON AND TIBC
Iron: 56 ug/dL (ref 45–182)
Saturation Ratios: 18 % (ref 17.9–39.5)
TIBC: 316 ug/dL (ref 250–450)
UIBC: 260 ug/dL

## 2024-05-11 LAB — CBC WITH DIFFERENTIAL/PLATELET
Abs Immature Granulocytes: 0.02 K/uL (ref 0.00–0.07)
Basophils Absolute: 0 K/uL (ref 0.0–0.1)
Basophils Relative: 0 %
Eosinophils Absolute: 0.1 K/uL (ref 0.0–0.5)
Eosinophils Relative: 2 %
HCT: 26.4 % — ABNORMAL LOW (ref 39.0–52.0)
Hemoglobin: 8.3 g/dL — ABNORMAL LOW (ref 13.0–17.0)
Immature Granulocytes: 0 %
Lymphocytes Relative: 24 %
Lymphs Abs: 1.8 K/uL (ref 0.7–4.0)
MCH: 31.4 pg (ref 26.0–34.0)
MCHC: 31.4 g/dL (ref 30.0–36.0)
MCV: 100 fL (ref 80.0–100.0)
Monocytes Absolute: 0.6 K/uL (ref 0.1–1.0)
Monocytes Relative: 8 %
Neutro Abs: 5 K/uL (ref 1.7–7.7)
Neutrophils Relative %: 66 %
Platelets: 211 K/uL (ref 150–400)
RBC: 2.64 MIL/uL — ABNORMAL LOW (ref 4.22–5.81)
RDW: 17.2 % — ABNORMAL HIGH (ref 11.5–15.5)
WBC: 7.6 K/uL (ref 4.0–10.5)
nRBC: 0.3 % — ABNORMAL HIGH (ref 0.0–0.2)

## 2024-05-11 LAB — SAMPLE TO BLOOD BANK

## 2024-05-11 LAB — FERRITIN: Ferritin: 143 ng/mL (ref 24–336)

## 2024-05-11 MED ORDER — DARBEPOETIN ALFA 200 MCG/0.4ML IJ SOSY
200.0000 ug | PREFILLED_SYRINGE | Freq: Once | INTRAMUSCULAR | Status: AC
  Administered 2024-05-11: 200 ug via SUBCUTANEOUS
  Filled 2024-05-11: qty 0.4

## 2024-05-11 NOTE — Progress Notes (Signed)
 [facility]  [phone] [phone]  ID: Jay Morris OB: 04-Oct-1945  MR#: [id number]  RDW#:[id number]  Patient Care Team: Auston Reyes BIRCH, MD as PCP - General (Internal Medicine) Jacobo Evalene PARAS, MD as Consulting Physician (Oncology)  CHIEF COMPLAINT: Anemia, unspecified.  INTERVAL HISTORY: Patient returns to clinic today for repeat laboratory, further evaluation, consideration of Aranesp .  He feels mildly improved since receiving treatment with IV iron  recently.  He never required hemorrhoid surgery and his bleeding has improved as well.  He continues to have chronic shortness of breath and requires oxygen 24 hours a day.  He does not complain of weakness or fatigue today.  He has no neurologic complaints.  He denies any recent fevers or illnesses.  He has a good appetite and denies weight loss.  He denies any chest pain, cough, or hemoptysis.  He denies any nausea, vomiting, constipation, or diarrhea. He has no urinary complaints.  Patient offers no further specific complaints today.  REVIEW OF SYSTEMS:   Review of Systems  Constitutional: Negative.  Negative for fever, malaise/fatigue and weight loss.  Respiratory:  Positive for shortness of breath. Negative for cough and hemoptysis.   Cardiovascular: Negative.  Negative for chest pain and leg swelling.  Gastrointestinal: Negative.  Negative for abdominal pain, blood in stool and melena.  Genitourinary: Negative.  Negative for hematuria.  Musculoskeletal: Negative.  Negative for back pain.  Skin: Negative.  Negative for rash.  Neurological: Negative.  Negative for dizziness, focal weakness, weakness and headaches.  Psychiatric/Behavioral: Negative.  The patient is not nervous/anxious.     As per HPI. Otherwise, a complete review of systems is negative.  PAST MEDICAL HISTORY: Past Medical History:  Diagnosis Date   (HFpEF) heart failure with preserved ejection fraction (HCC)    a.)  TTE 04/01/2016: EF 60-65%, LAE, MAC, mild MR/AR/TR, mild-mod PR, G2DD; b.) TTE 12/18/2018: EF 55%, mild LVH, mild AR/MR/TR. mod PR; c.) TTE 05/31/2019: EF 60-65%, mild LVH, RAE/RVE, triv AR.MT, PR, mild TR; d.) TTE 10/26/2019: EF 55%, BAE, RVE, triv AR, mild MR/PR, sev TR, G2DD; e.) R/LHC 01/17/2020: mPA 52, PCWP 25, LVEDP 19, RVEDP 25, mRA 13   Anemia    Aortic atherosclerosis    Asthma    BPH (benign prostatic hyperplasia)    CKD (chronic kidney disease), stage III (HCC)    COPD (chronic obstructive pulmonary disease) (HCC)    Coronary artery disease 01/17/2020   a.) LHC 01/17/2020: 25% oLAD, 65% o-pRCA, 45% pLAD, 65% mLAD, 35% OM2 --> med mgmt.   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   PAF (paroxysmal atrial fibrillation) (HCC)    a.) CHA2DS2-VASc = 6 (age x 2, HFpEF, HTN, vascular disease, T2DM);  b.) cardiac ablation in approx 2014-2015 at [facility]; c.) A.fib recurrence in setting of SARS-CoV-2 --> Tx'd with IV amiodarone  + metoprolol  + DCCV x 1 (200 J) 06/22/2019 restoring NSR; d.) rate/rhythm maintained without pharmacological intervention; chronically anticoagulated using standard dose apixaban    Peripheral vascular disease  Phimosis of penis    Pneumonia due to COVID-19 virus 06/02/2019   a.) hospitalized at [facility]) from 06/02/2019 - 06/25/2019; Tx'd with steriods + remdesivir  + convalescent plasma   Pneumonia due to COVID-19 virus 05/27/2022   a.) hospitalized at [facility] from 05/27/2022 - 05/30/2022 for acute on chronic respiratory  failure secondary to SARS-CoV-2 PNA ; Tx'd with IV steroids + remdesivir    Pulmonary fibrosis (HCC)    Pulmonary HTN (HCC) 04/01/2016   a.) TTE 04/01/2016: EF 60-65%, RVSP 40; b.) TTE 12/18/2018: EF 55%, RVSP 45.9; c.) TTE 05/31/2019: EF 60-65%, RVSP 35.5; d.) TTE 10/26/2019: EF 55%, RVSP 100.4; e.) R/LHC 01/17/2020: mPA 52, PCWP 25, LVEDP 19, RVEDP 25, mRA 13; f.) Tx with PDE5i (sildenafil ) + vasodilator (treprostinil )   Type 2 diabetes mellitus treated with insulin  (HCC)     PAST SURGICAL HISTORY: Past Surgical History:  Procedure Laterality Date   BACK SURGERY     x3 L3-L4    CARDIAC ELECTROPHYSIOLOGY MAPPING AND ABLATION N/A    performed at [facility] in approximately 2014-2015   CARDIOVERSION N/A 06/22/2019   Procedure: CARDIOVERSION;  Surgeon: Okey Vina GAILS, MD;  Location: [facility] ENDOSCOPY;  Service: Cardiovascular;  Laterality: N/A;   COLONOSCOPY WITH PROPOFOL  N/A 06/15/2017   Procedure: COLONOSCOPY WITH PROPOFOL ;  Surgeon: Toledo, Ladell POUR, MD;  Location: [facility] ENDOSCOPY;  Service: Gastroenterology;  Laterality: N/A;   DORSAL SLIT N/A 12/21/2022   Procedure: DORSAL SLIT;  Surgeon: Twylla Glendia BROCKS, MD;  Location: [facility] ORS;  Service: Urology;  Laterality: N/A;   ENTEROSCOPY N/A 11/24/2017   Procedure: ENTEROSCOPY;  Surgeon: Therisa Bi, MD;  Location: [facility] ENDOSCOPY;  Service: Gastroenterology;  Laterality: N/A;   ESOPHAGOGASTRODUODENOSCOPY N/A 11/27/2017   Procedure: ESOPHAGOGASTRODUODENOSCOPY (EGD);  Surgeon: Jinny Carmine, MD;  Location: [facility] ENDOSCOPY;  Service: Endoscopy;  Laterality: N/A;   ESOPHAGOGASTRODUODENOSCOPY (EGD) WITH PROPOFOL  N/A 06/02/2017   Procedure: ESOPHAGOGASTRODUODENOSCOPY (EGD) WITH PROPOFOL ;  Surgeon: Toledo, Ladell POUR, MD;  Location: [facility] ENDOSCOPY;  Service: Gastroenterology;  Laterality: N/A;   ESOPHAGOGASTRODUODENOSCOPY (EGD) WITH PROPOFOL  N/A 08/04/2021   Procedure: ESOPHAGOGASTRODUODENOSCOPY (EGD) WITH PROPOFOL ;  Surgeon: Maryruth Ole DASEN, MD;  Location: [facility]  ENDOSCOPY;  Service: Endoscopy;  Laterality: N/A;   EVALUATION UNDER ANESTHESIA WITH HEMORRHOIDECTOMY N/A 02/12/2022   Procedure: EXAM UNDER ANESTHESIA WITH HEMORRHOIDECTOMY;  Surgeon: Tye Millet, DO;  Location: [facility] ORS;  Service: General;  Laterality: N/A;   GIVENS CAPSULE STUDY N/A 11/25/2017   Procedure: GIVENS CAPSULE STUDY;  Surgeon: Therisa Bi, MD;  Location: [facility] ENDOSCOPY;  Service: Gastroenterology;  Laterality: N/A;   HAND SURGERY     RIGHT/LEFT HEART CATH AND CORONARY ANGIOGRAPHY N/A 01/17/2020   Procedure: RIGHT/LEFT HEART CATH AND CORONARY ANGIOGRAPHY;  Surgeon: Hester Wolm PARAS, MD;  Location: [facility] INVASIVE CV LAB;  Service: Cardiovascular;  Laterality: N/A;    FAMILY HISTORY: Family History  Problem Relation Age of Onset   Heart disease Mother    Cancer Father    Cancer Paternal Grandfather     ADVANCED DIRECTIVES (Y/N):  N  HEALTH MAINTENANCE: Social History   Tobacco Use   Smoking status: Former    Current packs/day: 0.00    Average packs/day: 1 pack/day for 30.0 years (30.0 ttl pk-yrs)    Types: Cigarettes    Start date: 80    Quit date: 1995    Years since quitting: 30.8    Passive exposure: Past   Smokeless tobacco: Never  Vaping Use   Vaping status: Never Used  Substance  Use Topics   Alcohol use: No    Alcohol/week: 0.0 standard drinks of alcohol    Comment: occasional   Drug use: No     Colonoscopy:  PAP:  Bone density:  Lipid panel:  Allergies  Allergen Reactions   Shrimp [Shellfish Allergy] Anaphylaxis   Shrimp Extract     Current Outpatient Medications  Medication Sig Dispense Refill   acetaminophen  (TYLENOL ) 500 MG tablet Take 500-1,000 mg by mouth every 6 (six) hours as needed for moderate pain or headache. 30 tablet 0   allopurinol  (ZYLOPRIM ) 100 MG tablet Take 200 mg by mouth daily.     apixaban  (ELIQUIS ) 5 MG TABS tablet Take 5 mg by mouth daily.     ascorbic acid  (VITAMIN C ) 500 MG tablet Take 500 mg by mouth daily.      atorvastatin  (LIPITOR) 20 MG tablet Take 20 mg by mouth at bedtime.      calcitRIOL  (ROCALTROL ) 0.25 MCG capsule Take 0.25 mcg by mouth daily.     Cholecalciferol  (DIALYVITE VITAMIN D  5000) 125 MCG (5000 UT) capsule Take 5,000 Units by mouth daily.     colchicine  0.6 MG tablet Take 0.6 mg by mouth daily.     cyclobenzaprine  (FLEXERIL ) 5 MG tablet Take 1 tablet (5 mg total) by mouth 3 (three) times daily as needed. 12 tablet 0   finasteride  (PROSCAR ) 5 MG tablet TAKE 1 TABLET BY MOUTH DAILY (Patient taking differently: Take 5 mg by mouth daily.) 30 tablet 11   fluticasone  furoate-vilanterol (BREO ELLIPTA ) 100-25 MCG/ACT AEPB Inhale 1 puff into the lungs daily.     furosemide  (LASIX ) 20 MG tablet Take 20 mg by mouth daily.     HYDROcodone -acetaminophen  (NORCO/VICODIN) 5-325 MG tablet Take 1 tablet by mouth every 4 (four) hours as needed.     hydrocortisone  2.5 % cream Apply 1 Application topically 2 (two) times daily.     insulin  aspart (NOVOLOG ) 100 UNIT/ML FlexPen Inject 35-45 Units into the skin 3 (three) times daily with meals. (Based upon blood glucose reading)     insulin  degludec (TRESIBA  FLEXTOUCH) 200 UNIT/ML FlexTouch Pen Inject 80 Units into the skin at bedtime.     lidocaine  (XYLOCAINE ) 5 % ointment Apply 1 Application topically 3 (three) times daily as needed. 35 g 0   lisinopril  (ZESTRIL ) 5 MG tablet Take 5 mg by mouth daily.     magnesium  oxide (MAG-OX) 400 MG tablet Take 400 mg by mouth 3 (three) times daily.     metoprolol  tartrate (LOPRESSOR ) 50 MG tablet Take 50 mg by mouth 2 (two) times daily.     nystatin -triamcinolone  ointment (MYCOLOG) Apply 1 Application topically 2 (two) times daily. 30 g 2   pantoprazole  (PROTONIX ) 40 MG tablet Take 1 tablet (40 mg total) by mouth 2 (two) times daily before a meal. 60 tablet 0   sildenafil  (REVATIO ) 20 MG tablet Take 20 mg by mouth 3 (three) times daily.     tamsulosin  (FLOMAX ) 0.4 MG CAPS capsule Take 0.4 mg by mouth daily after supper.      vitamin B-12 (CYANOCOBALAMIN ) 1000 MCG tablet Take 1,000 mcg by mouth daily.     No current facility-administered medications for this visit.    OBJECTIVE: Vitals:   05/11/24 1123 05/11/24 1128  BP: (!) 110/40 (!) 110/40  Pulse: 62   Resp: 18   Temp: (!) 97 F (36.1 C)   SpO2: 95%       Body mass index is 29.6 kg/m.    ECOG  FS:1 - Symptomatic but completely ambulatory  General: Well-developed, well-nourished, no acute distress.  Sitting in wheelchair. Eyes: Pink conjunctiva, anicteric sclera. HEENT: Normocephalic, moist mucous membranes. Lungs: No audible wheezing or coughing. Heart: Regular rate and rhythm. Abdomen: Soft, nontender, no obvious distention. Musculoskeletal: No edema, cyanosis, or clubbing. Neuro: Alert, answering all questions appropriately. Cranial nerves grossly intact. Skin: No rashes or petechiae noted. Psych: Normal affect.  LAB RESULTS:  Lab Results  Component Value Date   NA 137 03/05/2024   K 3.6 03/05/2024   CL 107 03/05/2024   CO2 21 (L) 03/05/2024   GLUCOSE 123 (H) 03/05/2024   BUN 31 (H) 03/05/2024   CREATININE 2.38 (H) 03/05/2024   CALCIUM  8.9 03/05/2024   PROT 6.7 03/05/2024   ALBUMIN  3.8 03/05/2024   AST 15 03/05/2024   ALT 16 03/05/2024   ALKPHOS 42 03/05/2024   BILITOT 0.8 03/05/2024   GFRNONAA 27 (L) 03/05/2024   GFRAA 39 (L) 10/18/2019    Lab Results  Component Value Date   WBC 7.6 05/11/2024   NEUTROABS 5.0 05/11/2024   HGB 8.3 (L) 05/11/2024   HCT 26.4 (L) 05/11/2024   MCV 100.0 05/11/2024   PLT 211 05/11/2024   Lab Results  Component Value Date   IRON  56 05/11/2024   TIBC 316 05/11/2024   IRONPCTSAT 18 05/11/2024   Lab Results  Component Value Date   FERRITIN 143 05/11/2024     STUDIES: No results found.   ASSESSMENT: Anemia, unspecified.  PLAN:   Anemia, unspecified: Bone marrow biopsy completed on June 25, 2020 was essentially negative only with a hypercellular marrow for age.  Patient was  noted to have decreased iron  stores and rare ringed sideroblasts.  FISH and cytogenetics were negative.  Previously, B12, folate, and hemolysis labs were found to be either negative or within normal limits.  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  Given patient's drop in hemoglobin and active bleeding, he received 4 doses of 200 mg IV Venofer  most recently on March 12, 2024.  Iron  stores are now within normal limits.  His hemoglobin remains decreased, therefore we will proceed with 200 mg Aranesp  today.  Return to clinic in 1 month for repeat laboratory work and continuation of Aranesp  this if hemoglobin remains below 10.0.  Patient to then return to clinic in 2 months for further evaluation and continuation of treatment if needed.   MGUS: Patient previously noted to have an M spike of 0.3, but repeat laboratory work in October 2023 this was no longer observed.  Both kappa and lambda free light chains are only mildly elevated and immunoglobulins are within normal limits.  Bone marrow biopsy only revealed approximately 1% plasma cells.  Likely clinically insignificant. Chronic renal insufficiency: Chronic and unchanged.  Patient's most recent GFR is 27.  Continue follow-up with nephrology as indicated. Hypertension: Continue monitoring and treatment per primary care. Shortness of breath: Continue oxygen as prescribed.   Patient expressed understanding and was in agreement with this plan. He also understands that He can call clinic at any time with any questions, concerns, or complaints.    Evalene JINNY Reusing, MD   05/11/2024 12:57 PM

## 2024-05-16 ENCOUNTER — Ambulatory Visit: Payer: Self-pay | Admitting: Physician Assistant

## 2024-05-16 ENCOUNTER — Telehealth: Payer: Self-pay | Admitting: Physician Assistant

## 2024-05-16 DIAGNOSIS — N401 Enlarged prostate with lower urinary tract symptoms: Secondary | ICD-10-CM | POA: Diagnosis not present

## 2024-05-16 DIAGNOSIS — R338 Other retention of urine: Secondary | ICD-10-CM | POA: Diagnosis not present

## 2024-05-16 MED ORDER — FINASTERIDE 5 MG PO TABS: 5.0000 mg | ORAL_TABLET | Freq: Every day | ORAL | 11 refills | Status: AC

## 2024-05-16 NOTE — Telephone Encounter (Signed)
 I saw him for annual follow-up today.  Can you please make sure that [employer] has authorization to refill his catheter supplies for another year?  14 French Coloplast Luja coud catheters once daily.  Due to BPH and the inability to advance a straight tip catheter to the level of the bladder, he does require a coud catheter.

## 2024-05-16 NOTE — Telephone Encounter (Signed)
 New order faxed, Scanned in see media.

## 2024-05-16 NOTE — Progress Notes (Signed)
 05/16/2024 11:08 AM   [name] 06/14/1946 [id number]  CC: Chief Complaint  Patient presents with   Follow-up   HPI: Jay Morris is a 78 y.o. male with PMH A-fib on Eliquis , pulmonary hypertension, pulmonary fibrosis, COPD on 3 L at baseline, DM 2, CHF, CKD 4, and BPH with urinary retention on finasteride  who augments with CIC once daily who presents today for annual follow-up.  He is accompanied today by his wife, Jay Morris, who contributes to HPI.  Today they report Jay Morris continues to cath him once daily in the evening to augment voiding.  She gets approximately 200 to 600 mL out with this.  He tolerates it well, they have enough supplies, and he has had no UTIs.  No acute concerns today.  PMH: Past Medical History:  Diagnosis Date   (HFpEF) heart failure with preserved ejection fraction (HCC)    a.) TTE 04/01/2016: EF 60-65%, LAE, MAC, mild MR/AR/TR, mild-mod PR, G2DD; b.) TTE 12/18/2018: EF 55%, mild LVH, mild AR/MR/TR. mod PR; c.) TTE 05/31/2019: EF 60-65%, mild LVH, RAE/RVE, triv AR.MT, PR, mild TR; d.) TTE 10/26/2019: EF 55%, BAE, RVE, triv AR, mild MR/PR, sev TR, G2DD; e.) R/LHC 01/17/2020: mPA 52, PCWP 25, LVEDP 19, RVEDP 25, mRA 13   Anemia    Aortic atherosclerosis    Asthma    BPH (benign prostatic hyperplasia)    CKD (chronic kidney disease), stage III (HCC)    COPD (chronic obstructive pulmonary disease) (HCC)    Coronary artery disease 01/17/2020   a.) LHC 01/17/2020: 25% oLAD, 65% o-pRCA, 45% pLAD, 65% mLAD, 35% OM2 --> med mgmt.   DDD (degenerative disc disease), lumbar    Dyspnea    GERD (gastroesophageal reflux disease)    GI bleed 11/2017   Gout    Helicobacter pylori gastritis    Hemorrhoids    History of 2019 novel coronavirus disease (COVID-19)    a.) 05/30/2019; b.) 05/27/2022   History of asbestos exposure    Hyperlipidemia    Hypertension    Long term current use of anticoagulant    a.) apixaban    MGUS (monoclonal gammopathy of unknown  significance) 09/03/2019   PAF (paroxysmal atrial fibrillation) (HCC)    a.) CHA2DS2-VASc = 6 (age x 2, HFpEF, HTN, vascular disease, T2DM);  b.) cardiac ablation in approx 2014-2015 at [facility]; c.) A.fib recurrence in setting of SARS-CoV-2 --> Tx'd with IV amiodarone  + metoprolol  + DCCV x 1 (200 J) 06/22/2019 restoring NSR; d.) rate/rhythm maintained without pharmacological intervention; chronically anticoagulated using standard dose apixaban    Peripheral vascular disease    Phimosis of penis    Pneumonia due to COVID-19 virus 06/02/2019   a.) hospitalized at [facility]) from 06/02/2019 - 06/25/2019; Tx'd with steriods + remdesivir  + convalescent plasma   Pneumonia due to COVID-19 virus 05/27/2022   a.) hospitalized at [facility] from 05/27/2022 - 05/30/2022 for acute on chronic respiratory failure secondary to SARS-CoV-2 PNA ; Tx'd with IV steroids + remdesivir    Pulmonary fibrosis (HCC)    Pulmonary HTN (HCC) 04/01/2016   a.) TTE 04/01/2016: EF 60-65%, RVSP 40; b.) TTE 12/18/2018: EF 55%, RVSP 45.9; c.) TTE 05/31/2019: EF 60-65%, RVSP 35.5; d.) TTE 10/26/2019: EF 55%, RVSP 100.4; e.) R/LHC 01/17/2020: mPA 52, PCWP 25, LVEDP 19, RVEDP 25, mRA 13; f.) Tx with PDE5i (sildenafil ) + vasodilator (treprostinil )   Type 2 diabetes mellitus treated with insulin  [facility]     Surgical History: Past Surgical History:  Procedure Laterality  Date   BACK SURGERY     x3 L3-L4    CARDIAC ELECTROPHYSIOLOGY MAPPING AND ABLATION N/A    performed at [facility] in approximately 2014-2015   CARDIOVERSION N/A 06/22/2019   Procedure: CARDIOVERSION;  Surgeon: Okey Vina GAILS, MD;  Location: [facility] ENDOSCOPY;  Service: Cardiovascular;  Laterality: N/A;   COLONOSCOPY WITH PROPOFOL  N/A 06/15/2017   Procedure: COLONOSCOPY WITH PROPOFOL ;  Surgeon: Toledo, Ladell POUR, MD;  Location: [facility] ENDOSCOPY;  Service: Gastroenterology;  Laterality: N/A;   DORSAL SLIT N/A 12/21/2022   Procedure: DORSAL SLIT;   Surgeon: Twylla Glendia BROCKS, MD;  Location: [facility] ORS;  Service: Urology;  Laterality: N/A;   ENTEROSCOPY N/A 11/24/2017   Procedure: ENTEROSCOPY;  Surgeon: Therisa Bi, MD;  Location: [facility] ENDOSCOPY;  Service: Gastroenterology;  Laterality: N/A;   ESOPHAGOGASTRODUODENOSCOPY N/A 11/27/2017   Procedure: ESOPHAGOGASTRODUODENOSCOPY (EGD);  Surgeon: Jinny Carmine, MD;  Location: [facility] ENDOSCOPY;  Service: Endoscopy;  Laterality: N/A;   ESOPHAGOGASTRODUODENOSCOPY (EGD) WITH PROPOFOL  N/A 06/02/2017   Procedure: ESOPHAGOGASTRODUODENOSCOPY (EGD) WITH PROPOFOL ;  Surgeon: Toledo, Ladell POUR, MD;  Location: [facility] ENDOSCOPY;  Service: Gastroenterology;  Laterality: N/A;   ESOPHAGOGASTRODUODENOSCOPY (EGD) WITH PROPOFOL  N/A 08/04/2021   Procedure: ESOPHAGOGASTRODUODENOSCOPY (EGD) WITH PROPOFOL ;  Surgeon: Maryruth Ole DASEN, MD;  Location: [facility] ENDOSCOPY;  Service: Endoscopy;  Laterality: N/A;   EVALUATION UNDER ANESTHESIA WITH HEMORRHOIDECTOMY N/A 02/12/2022   Procedure: EXAM UNDER ANESTHESIA WITH HEMORRHOIDECTOMY;  Surgeon: Tye Millet, DO;  Location: [facility] ORS;  Service: General;  Laterality: N/A;   GIVENS CAPSULE STUDY N/A 11/25/2017   Procedure: GIVENS CAPSULE STUDY;  Surgeon: Therisa Bi, MD;  Location: [facility] ENDOSCOPY;  Service: Gastroenterology;  Laterality: N/A;   HAND SURGERY     RIGHT/LEFT HEART CATH AND CORONARY ANGIOGRAPHY N/A 01/17/2020   Procedure: RIGHT/LEFT HEART CATH AND CORONARY ANGIOGRAPHY;  Surgeon: Hester Wolm PARAS, MD;  Location: [facility] INVASIVE CV LAB;  Service: Cardiovascular;  Laterality: N/A;    Home Medications:  Allergies as of 05/16/2024       Reactions   Shrimp [shellfish Allergy] Anaphylaxis   Shrimp Extract         Medication List        Accurate as of May 16, 2024 11:08 AM. If you have any questions, ask your nurse or doctor.          acetaminophen  500 MG tablet Commonly known as: TYLENOL  Take 500-1,000 mg by mouth every 6 (six) hours as needed for moderate pain  or headache.   allopurinol  100 MG tablet Commonly known as: ZYLOPRIM  Take 200 mg by mouth daily.   apixaban  5 MG Tabs tablet Commonly known as: ELIQUIS  Take 5 mg by mouth daily.   ascorbic acid  500 MG tablet Commonly known as: VITAMIN C  Take 500 mg by mouth daily.   atorvastatin  20 MG tablet Commonly known as: LIPITOR Take 20 mg by mouth at bedtime.   calcitRIOL  0.25 MCG capsule Commonly known as: ROCALTROL  Take 0.25 mcg by mouth daily.   colchicine  0.6 MG tablet Take 0.6 mg by mouth daily.   cyanocobalamin  1000 MCG tablet Commonly known as: VITAMIN B12 Take 1,000 mcg by mouth daily.   cyclobenzaprine  5 MG tablet Commonly known as: FLEXERIL  Take 1 tablet (5 mg total) by mouth 3 (three) times daily as needed.   Dialyvite Vitamin D  5000 125 MCG (5000 UT) capsule Generic drug: Cholecalciferol  Take 5,000 Units by mouth daily.   finasteride  5 MG tablet Commonly known as: PROSCAR  TAKE 1 TABLET BY MOUTH DAILY   fluticasone  furoate-vilanterol 100-25 MCG/ACT  Aepb Commonly known as: BREO ELLIPTA  Inhale 1 puff into the lungs daily.   furosemide  20 MG tablet Commonly known as: LASIX  Take 20 mg by mouth daily.   HYDROcodone -acetaminophen  5-325 MG tablet Commonly known as: NORCO/VICODIN Take 1 tablet by mouth every 4 (four) hours as needed.   hydrocortisone  2.5 % cream Apply 1 Application topically 2 (two) times daily.   insulin  aspart 100 UNIT/ML FlexPen Commonly known as: NOVOLOG  Inject 35-45 Units into the skin 3 (three) times daily with meals. (Based upon blood glucose reading)   lidocaine  5 % ointment Commonly known as: XYLOCAINE  Apply 1 Application topically 3 (three) times daily as needed.   lisinopril  5 MG tablet Commonly known as: ZESTRIL  Take 5 mg by mouth daily.   magnesium  oxide 400 MG tablet Commonly known as: MAG-OX Take 400 mg by mouth 3 (three) times daily.   metoprolol  tartrate 50 MG tablet Commonly known as: LOPRESSOR  Take 50 mg by mouth 2  (two) times daily.   nystatin -triamcinolone  ointment Commonly known as: MYCOLOG Apply 1 Application topically 2 (two) times daily.   pantoprazole  40 MG tablet Commonly known as: PROTONIX  Take 1 tablet (40 mg total) by mouth 2 (two) times daily before a meal.   sildenafil  20 MG tablet Commonly known as: REVATIO  Take 20 mg by mouth 3 (three) times daily.   tamsulosin  0.4 MG Caps capsule Commonly known as: FLOMAX  Take 0.4 mg by mouth daily after supper.   Tresiba  FlexTouch 200 UNIT/ML FlexTouch Pen Generic drug: insulin  degludec Inject 80 Units into the skin at bedtime.        Allergies:  Allergies  Allergen Reactions   Shrimp [Shellfish Allergy] Anaphylaxis   Shrimp Extract     Family History: Family History  Problem Relation Age of Onset   Heart disease Mother    Cancer Father    Cancer Paternal Grandfather     Social History:   reports that he quit smoking about 30 years ago. His smoking use included cigarettes. He started smoking about 60 years ago. He has a 30 pack-year smoking history. He has been exposed to tobacco smoke. He has never used smokeless tobacco. He reports that he does not drink alcohol and does not use drugs.  Physical Exam: There were no vitals taken for this visit.  Constitutional:  Alert and oriented, no acute distress, nontoxic appearing HEENT: [place], AT Cardiovascular: No clubbing, cyanosis, or edema Respiratory: Normal respiratory effort, no increased work of breathing Skin: No rashes, bruises or suspicious lesions Neurologic: Grossly intact, no focal deficits, moving all 4 extremities Psychiatric: Normal mood and affect  Assessment & Plan:   1. Benign prostatic hyperplasia with urinary retention (Primary) Well-controlled with spontaneous voids and CIC once daily.  Will plan to continue this.  Refilling finasteride . - finasteride  (PROSCAR ) 5 MG tablet; Take 1 tablet (5 mg total) by mouth daily.  Dispense: 30 tablet; Refill: 11   Return  in about 1 year (around 05/16/2025) for Annual CIC follow up.  Lucie Hones, PA-C  [facility] [place] [address] [place], [place] [address] [phone]

## 2024-05-16 NOTE — Telephone Encounter (Signed)
 New order faxed to [employer].

## 2024-06-05 ENCOUNTER — Other Ambulatory Visit

## 2024-06-06 ENCOUNTER — Ambulatory Visit: Admitting: Oncology

## 2024-06-06 ENCOUNTER — Ambulatory Visit

## 2024-06-08 ENCOUNTER — Other Ambulatory Visit: Payer: Self-pay | Admitting: *Deleted

## 2024-06-08 DIAGNOSIS — D649 Anemia, unspecified: Secondary | ICD-10-CM

## 2024-06-11 ENCOUNTER — Inpatient Hospital Stay: Attending: Oncology

## 2024-06-11 ENCOUNTER — Inpatient Hospital Stay

## 2024-06-11 VITALS — BP 130/65

## 2024-06-11 DIAGNOSIS — D631 Anemia in chronic kidney disease: Secondary | ICD-10-CM | POA: Insufficient documentation

## 2024-06-11 DIAGNOSIS — D649 Anemia, unspecified: Secondary | ICD-10-CM

## 2024-06-11 DIAGNOSIS — N184 Chronic kidney disease, stage 4 (severe): Secondary | ICD-10-CM | POA: Insufficient documentation

## 2024-06-11 LAB — CBC WITH DIFFERENTIAL (CANCER CENTER ONLY)
Abs Immature Granulocytes: 0.02 K/uL (ref 0.00–0.07)
Basophils Absolute: 0 K/uL (ref 0.0–0.1)
Basophils Relative: 1 %
Eosinophils Absolute: 0.2 K/uL (ref 0.0–0.5)
Eosinophils Relative: 2 %
HCT: 27.3 % — ABNORMAL LOW (ref 39.0–52.0)
Hemoglobin: 8.8 g/dL — ABNORMAL LOW (ref 13.0–17.0)
Immature Granulocytes: 0 %
Lymphocytes Relative: 20 %
Lymphs Abs: 1.6 K/uL (ref 0.7–4.0)
MCH: 32 pg (ref 26.0–34.0)
MCHC: 32.2 g/dL (ref 30.0–36.0)
MCV: 99.3 fL (ref 80.0–100.0)
Monocytes Absolute: 0.5 K/uL (ref 0.1–1.0)
Monocytes Relative: 7 %
Neutro Abs: 5.5 K/uL (ref 1.7–7.7)
Neutrophils Relative %: 70 %
Platelet Count: 217 K/uL (ref 150–400)
RBC: 2.75 MIL/uL — ABNORMAL LOW (ref 4.22–5.81)
RDW: 17.3 % — ABNORMAL HIGH (ref 11.5–15.5)
WBC Count: 7.8 K/uL (ref 4.0–10.5)
nRBC: 0 % (ref 0.0–0.2)

## 2024-06-11 MED ORDER — DARBEPOETIN ALFA 200 MCG/0.4ML IJ SOSY
200.0000 ug | PREFILLED_SYRINGE | Freq: Once | INTRAMUSCULAR | Status: AC
  Administered 2024-06-11: 200 ug via SUBCUTANEOUS
  Filled 2024-06-11: qty 0.4

## 2024-07-16 ENCOUNTER — Inpatient Hospital Stay

## 2024-07-16 ENCOUNTER — Encounter: Payer: Self-pay | Admitting: Oncology

## 2024-07-16 ENCOUNTER — Inpatient Hospital Stay: Attending: Oncology

## 2024-07-16 ENCOUNTER — Inpatient Hospital Stay (HOSPITAL_BASED_OUTPATIENT_CLINIC_OR_DEPARTMENT_OTHER): Admitting: Oncology

## 2024-07-16 VITALS — BP 118/69 | HR 57 | Temp 97.9°F | Resp 16 | Ht 67.0 in | Wt 188.0 lb

## 2024-07-16 DIAGNOSIS — D631 Anemia in chronic kidney disease: Secondary | ICD-10-CM | POA: Insufficient documentation

## 2024-07-16 DIAGNOSIS — Z79899 Other long term (current) drug therapy: Secondary | ICD-10-CM | POA: Diagnosis not present

## 2024-07-16 DIAGNOSIS — N184 Chronic kidney disease, stage 4 (severe): Secondary | ICD-10-CM | POA: Insufficient documentation

## 2024-07-16 DIAGNOSIS — D649 Anemia, unspecified: Secondary | ICD-10-CM

## 2024-07-16 LAB — CBC WITH DIFFERENTIAL (CANCER CENTER ONLY)
Abs Immature Granulocytes: 0.03 K/uL (ref 0.00–0.07)
Basophils Absolute: 0 K/uL (ref 0.0–0.1)
Basophils Relative: 1 %
Eosinophils Absolute: 0.2 K/uL (ref 0.0–0.5)
Eosinophils Relative: 2 %
HCT: 27.5 % — ABNORMAL LOW (ref 39.0–52.0)
Hemoglobin: 8.8 g/dL — ABNORMAL LOW (ref 13.0–17.0)
Immature Granulocytes: 0 %
Lymphocytes Relative: 17 %
Lymphs Abs: 1.5 K/uL (ref 0.7–4.0)
MCH: 31.5 pg (ref 26.0–34.0)
MCHC: 32 g/dL (ref 30.0–36.0)
MCV: 98.6 fL (ref 80.0–100.0)
Monocytes Absolute: 0.6 K/uL (ref 0.1–1.0)
Monocytes Relative: 7 %
Neutro Abs: 6.5 K/uL (ref 1.7–7.7)
Neutrophils Relative %: 73 %
Platelet Count: 230 K/uL (ref 150–400)
RBC: 2.79 MIL/uL — ABNORMAL LOW (ref 4.22–5.81)
RDW: 16.7 % — ABNORMAL HIGH (ref 11.5–15.5)
WBC Count: 8.8 K/uL (ref 4.0–10.5)
nRBC: 0 % (ref 0.0–0.2)

## 2024-07-16 LAB — SAMPLE TO BLOOD BANK

## 2024-07-16 LAB — IRON AND TIBC
Iron: 40 ug/dL — ABNORMAL LOW (ref 45–182)
Saturation Ratios: 12 % — ABNORMAL LOW (ref 17.9–39.5)
TIBC: 319 ug/dL (ref 250–450)
UIBC: 280 ug/dL

## 2024-07-16 LAB — FERRITIN: Ferritin: 163 ng/mL (ref 24–336)

## 2024-07-16 MED ORDER — DARBEPOETIN ALFA 200 MCG/0.4ML IJ SOSY
200.0000 ug | PREFILLED_SYRINGE | Freq: Once | INTRAMUSCULAR | Status: AC
  Administered 2024-07-16: 200 ug via SUBCUTANEOUS
  Filled 2024-07-16: qty 0.4

## 2024-07-16 NOTE — Progress Notes (Signed)
 " [facility]  [phone]) [phone] [phone]) [phone]  ID: Jay Morris OB: [birth year]  MR#: [id number]  RDW#:[id number]  Patient Care Team: Auston Reyes BIRCH, MD as PCP - General (Internal Medicine) Jacobo Evalene PARAS, MD as Consulting Physician (Oncology)  CHIEF COMPLAINT: Anemia, unspecified.  INTERVAL HISTORY: Patient returns to clinic today for repeat laboratory, further evaluation, and continuation of Aranesp .  He currently feels well and is asymptomatic.  He does not complain of any weakness or fatigue today. He continues to have chronic shortness of breath and requires oxygen 24 hours a day.  He has no neurologic complaints.  He denies any recent fevers or illnesses.  He has a good appetite and denies weight loss.  He denies any chest pain, cough, or hemoptysis.  He denies any nausea, vomiting, constipation, or diarrhea. He has no urinary complaints.  Patient offers no further specific complaints today.  REVIEW OF SYSTEMS:   Review of Systems  Constitutional: Negative.  Negative for fever, malaise/fatigue and weight loss.  Respiratory:  Positive for shortness of breath. Negative for cough and hemoptysis.   Cardiovascular: Negative.  Negative for chest pain and leg swelling.  Gastrointestinal: Negative.  Negative for abdominal pain, blood in stool and melena.  Genitourinary: Negative.  Negative for hematuria.  Musculoskeletal: Negative.  Negative for back pain.  Skin: Negative.  Negative for rash.  Neurological: Negative.  Negative for dizziness, focal weakness, weakness and headaches.  Psychiatric/Behavioral: Negative.  The patient is not nervous/anxious.     As per HPI. Otherwise, a complete review of systems is negative.  PAST MEDICAL HISTORY: Past Medical History:  Diagnosis Date   (HFpEF) heart failure with preserved ejection fraction (HCC)    a.) TTE 04/01/2016: EF 60-65%, LAE, MAC, mild MR/AR/TR, mild-mod PR, G2DD; b.) TTE 12/18/2018: EF 55%,  mild LVH, mild AR/MR/TR. mod PR; c.) TTE 05/31/2019: EF 60-65%, mild LVH, RAE/RVE, triv AR.MT, PR, mild TR; d.) TTE 10/26/2019: EF 55%, BAE, RVE, triv AR, mild MR/PR, sev TR, G2DD; e.) R/LHC 01/17/2020: mPA 52, PCWP 25, LVEDP 19, RVEDP 25, mRA 13   Anemia    Aortic atherosclerosis    Asthma    BPH (benign prostatic hyperplasia)    CKD (chronic kidney disease), stage III (HCC)    COPD (chronic obstructive pulmonary disease) (HCC)    Coronary artery disease 01/17/2020   a.) LHC 01/17/2020: 25% oLAD, 65% o-pRCA, 45% pLAD, 65% mLAD, 35% OM2 --> med mgmt.   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   PAF (paroxysmal atrial fibrillation) (HCC)    a.) CHA2DS2-VASc = 6 (age x 2, HFpEF, HTN, vascular disease, T2DM);  b.) cardiac ablation in approx 2014-2015 at [facility]; c.) A.fib recurrence in setting of SARS-CoV-2 --> Tx'd with IV amiodarone  + metoprolol  + DCCV x 1 (200 J) 06/22/2019 restoring NSR; d.) rate/rhythm maintained without pharmacological intervention; chronically anticoagulated using standard dose apixaban    Peripheral vascular disease    Phimosis of penis    Pneumonia due to COVID-19 virus 06/02/2019   a.)  hospitalized at [facility] from 06/02/2019 - 06/25/2019; Tx'd with steriods + remdesivir  + convalescent plasma   Pneumonia due to COVID-19 virus 05/27/2022   a.) hospitalized at [facility] from 05/27/2022 - 05/30/2022 for acute on chronic respiratory failure secondary to SARS-CoV-2 PNA ; Tx'd with IV steroids + remdesivir    Pulmonary fibrosis (HCC)     Pulmonary HTN (HCC) 04/01/2016   a.) TTE 04/01/2016: EF 60-65%, RVSP 40; b.) TTE 12/18/2018: EF 55%, RVSP 45.9; c.) TTE 05/31/2019: EF 60-65%, RVSP 35.5; d.) TTE 10/26/2019: EF 55%, RVSP 100.4; e.) R/LHC 01/17/2020: mPA 52, PCWP 25, LVEDP 19, RVEDP 25, mRA 13; f.) Tx with PDE5i (sildenafil ) + vasodilator (treprostinil )   Type 2 diabetes mellitus treated with insulin  (HCC)     PAST SURGICAL HISTORY: Past Surgical History:  Procedure Laterality Date   BACK SURGERY     x3 L3-L4    CARDIAC ELECTROPHYSIOLOGY MAPPING AND ABLATION N/A    performed at [facility] in approximately 2014-2015   CARDIOVERSION N/A 06/22/2019   Procedure: CARDIOVERSION;  Surgeon: Okey Vina GAILS, MD;  Location: [facility] ENDOSCOPY;  Service: Cardiovascular;  Laterality: N/A;   COLONOSCOPY WITH PROPOFOL  N/A 06/15/2017   Procedure: COLONOSCOPY WITH PROPOFOL ;  Surgeon: Toledo, Ladell POUR, MD;  Location: [facility] ENDOSCOPY;  Service: Gastroenterology;  Laterality: N/A;   DORSAL SLIT N/A 12/21/2022   Procedure: DORSAL SLIT;  Surgeon: Twylla Glendia BROCKS, MD;  Location: [facility] ORS;  Service: Urology;  Laterality: N/A;   ENTEROSCOPY N/A 11/24/2017   Procedure: ENTEROSCOPY;  Surgeon: Therisa Bi, MD;  Location: [facility] ENDOSCOPY;  Service: Gastroenterology;  Laterality: N/A;   ESOPHAGOGASTRODUODENOSCOPY N/A 11/27/2017   Procedure: ESOPHAGOGASTRODUODENOSCOPY (EGD);  Surgeon: Jinny Carmine, MD;  Location: [facility] ENDOSCOPY;  Service: Endoscopy;  Laterality: N/A;   ESOPHAGOGASTRODUODENOSCOPY (EGD) WITH PROPOFOL  N/A 06/02/2017   Procedure: ESOPHAGOGASTRODUODENOSCOPY (EGD) WITH PROPOFOL ;  Surgeon: Toledo, Ladell POUR, MD;  Location: [facility] ENDOSCOPY;  Service: Gastroenterology;  Laterality: N/A;   ESOPHAGOGASTRODUODENOSCOPY (EGD) WITH PROPOFOL  N/A 08/04/2021   Procedure: ESOPHAGOGASTRODUODENOSCOPY (EGD) WITH PROPOFOL ;  Surgeon: Maryruth Ole DASEN, MD;  Location: [facility] ENDOSCOPY;  Service: Endoscopy;  Laterality: N/A;   EVALUATION UNDER ANESTHESIA WITH HEMORRHOIDECTOMY  N/A 02/12/2022   Procedure: EXAM UNDER ANESTHESIA WITH HEMORRHOIDECTOMY;  Surgeon: Tye Millet, DO;  Location: [facility] ORS;  Service: General;  Laterality: N/A;   GIVENS CAPSULE STUDY N/A 11/25/2017   Procedure: GIVENS CAPSULE STUDY;  Surgeon: Therisa Bi, MD;  Location: [facility] ENDOSCOPY;  Service: Gastroenterology;  Laterality: N/A;   HAND SURGERY     RIGHT/LEFT HEART CATH AND CORONARY ANGIOGRAPHY N/A 01/17/2020   Procedure: RIGHT/LEFT HEART CATH AND CORONARY ANGIOGRAPHY;  Surgeon: Hester Wolm PARAS, MD;  Location: [facility] INVASIVE CV LAB;  Service: Cardiovascular;  Laterality: N/A;    FAMILY HISTORY: Family History  Problem Relation Age of Onset   Heart disease Mother    Cancer Father    Cancer Paternal Grandfather     ADVANCED DIRECTIVES (Y/N):  N  HEALTH MAINTENANCE: Social History   Tobacco Use   Smoking status: Former    Current packs/day: 0.00    Average packs/day: 1 pack/day for 30.0 years (30.0 ttl pk-yrs)    Types: Cigarettes    Start date: 54    Quit date: 1995    Years since quitting: 31.0    Passive exposure: Past   Smokeless tobacco: Never  Vaping Use   Vaping status: Never Used  Substance Use Topics   Alcohol use: No    Alcohol/week: 0.0 standard drinks of  alcohol    Comment: occasional   Drug use: No     Colonoscopy:  PAP:  Bone density:  Lipid panel:  Allergies  Allergen Reactions   Shrimp [Shellfish Allergy] Anaphylaxis   Shrimp Extract     Current Outpatient Medications  Medication Sig Dispense Refill   acetaminophen  (TYLENOL ) 500 MG tablet Take 500-1,000 mg by mouth every 6 (six) hours as needed for moderate pain or headache. 30 tablet 0   allopurinol  (ZYLOPRIM ) 100 MG tablet Take 200 mg by mouth daily.     apixaban  (ELIQUIS ) 5 MG TABS tablet Take 5 mg by mouth daily.     ascorbic acid  (VITAMIN C ) 500 MG tablet Take 500 mg by mouth daily.     atorvastatin  (LIPITOR) 20 MG tablet Take 20 mg by mouth at bedtime.      calcitRIOL  (ROCALTROL ) 0.25  MCG capsule Take 0.25 mcg by mouth daily.     Cholecalciferol  (DIALYVITE VITAMIN D  5000) 125 MCG (5000 UT) capsule Take 5,000 Units by mouth daily.     colchicine  0.6 MG tablet Take 0.6 mg by mouth daily.     cyclobenzaprine  (FLEXERIL ) 5 MG tablet Take 1 tablet (5 mg total) by mouth 3 (three) times daily as needed. 12 tablet 0   finasteride  (PROSCAR ) 5 MG tablet Take 1 tablet (5 mg total) by mouth daily. 30 tablet 11   fluticasone  furoate-vilanterol (BREO ELLIPTA ) 100-25 MCG/ACT AEPB Inhale 1 puff into the lungs daily.     furosemide  (LASIX ) 20 MG tablet Take 20 mg by mouth daily.     HYDROcodone -acetaminophen  (NORCO/VICODIN) 5-325 MG tablet Take 1 tablet by mouth every 4 (four) hours as needed.     hydrocortisone  2.5 % cream Apply 1 Application topically 2 (two) times daily.     insulin  aspart (NOVOLOG ) 100 UNIT/ML FlexPen Inject 35-45 Units into the skin 3 (three) times daily with meals. (Based upon blood glucose reading)     insulin  degludec (TRESIBA  FLEXTOUCH) 200 UNIT/ML FlexTouch Pen Inject 80 Units into the skin at bedtime.     lidocaine  (XYLOCAINE ) 5 % ointment Apply 1 Application topically 3 (three) times daily as needed. 35 g 0   lisinopril  (ZESTRIL ) 5 MG tablet Take 5 mg by mouth daily.     magnesium  oxide (MAG-OX) 400 MG tablet Take 400 mg by mouth 3 (three) times daily.     metoprolol  tartrate (LOPRESSOR ) 50 MG tablet Take 50 mg by mouth 2 (two) times daily.     nystatin -triamcinolone  ointment (MYCOLOG) Apply 1 Application topically 2 (two) times daily. 30 g 2   pantoprazole  (PROTONIX ) 40 MG tablet Take 1 tablet (40 mg total) by mouth 2 (two) times daily before a meal. 60 tablet 0   sildenafil  (REVATIO ) 20 MG tablet Take 20 mg by mouth 3 (three) times daily.     tamsulosin  (FLOMAX ) 0.4 MG CAPS capsule Take 0.4 mg by mouth daily after supper.     vitamin B-12 (CYANOCOBALAMIN ) 1000 MCG tablet Take 1,000 mcg by mouth daily.     No current facility-administered medications for this  visit.    OBJECTIVE: Vitals:   07/16/24 0935  BP: 118/69  Pulse: (!) 57  Resp: 16  Temp: 97.9 F (36.6 C)  SpO2: (!) 83%      Body mass index is 29.44 kg/m.    ECOG FS:1 - Symptomatic but completely ambulatory  General: Well-developed, well-nourished, no acute distress.  Sitting in a wheelchair. Eyes: Pink conjunctiva, anicteric sclera. HEENT: Normocephalic, moist mucous membranes. Lungs:  No audible wheezing or coughing. Heart: Regular rate and rhythm. Abdomen: Soft, nontender, no obvious distention. Musculoskeletal: No edema, cyanosis, or clubbing. Neuro: Alert, answering all questions appropriately. Cranial nerves grossly intact. Skin: No rashes or petechiae noted. Psych: Normal affect.  LAB RESULTS:  Lab Results  Component Value Date   NA 137 03/05/2024   K 3.6 03/05/2024   CL 107 03/05/2024   CO2 21 (L) 03/05/2024   GLUCOSE 123 (H) 03/05/2024   BUN 31 (H) 03/05/2024   CREATININE 2.38 (H) 03/05/2024   CALCIUM  8.9 03/05/2024   PROT 6.7 03/05/2024   ALBUMIN  3.8 03/05/2024   AST 15 03/05/2024   ALT 16 03/05/2024   ALKPHOS 42 03/05/2024   BILITOT 0.8 03/05/2024   GFRNONAA 27 (L) 03/05/2024   GFRAA 39 (L) 10/18/2019    Lab Results  Component Value Date   WBC 8.8 07/16/2024   NEUTROABS 6.5 07/16/2024   HGB 8.8 (L) 07/16/2024   HCT 27.5 (L) 07/16/2024   MCV 98.6 07/16/2024   PLT 230 07/16/2024   Lab Results  Component Value Date   IRON  56 05/11/2024   TIBC 316 05/11/2024   IRONPCTSAT 18 05/11/2024   Lab Results  Component Value Date   FERRITIN 143 05/11/2024     STUDIES: No results found.   ASSESSMENT: Anemia, unspecified.  PLAN:   Anemia, unspecified: Bone marrow biopsy completed on June 25, 2020 was essentially negative only with a hypercellular marrow for age.  Patient was noted to have decreased iron  stores and rare ringed sideroblasts.  FISH and cytogenetics were negative.  Previously, B12, folate, and hemolysis labs were found to be  either negative or within normal limits.  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  Given patient's drop in hemoglobin and active bleeding, he received 4 doses of 200 mg IV Venofer  most recently on March 12, 2024.  His most recent iron  panel on May 11, 2024 was within normal limits.  Hemoglobin is 8.8 today, therefore we will proceed with 200 mcg Aranesp .  Return to clinic in 1 and 2 months for laboratory work and consideration of Aranesp  if his hemoglobin remains below 10.0.  Patient will then return to clinic in 3 months with repeat laboratory work, further evaluation, and continuation of treatment if needed.   MGUS: Likely clinically insignificant.  Patient previously noted to have an M spike of 0.3, but repeat laboratory work in October 2023 this was no longer observed.  Both kappa and lambda free light chains are only mildly elevated and immunoglobulins are within normal limits.  Bone marrow biopsy only revealed approximately 1% plasma cells.  Chronic renal insufficiency: Chronic and unchanged.  Patient's most recent GFR is 27.  Continue follow-up with nephrology as indicated. Hypertension: Blood pressure is within normal limits today.  Continue monitoring and treatment per primary care. Shortness of breath: Chronic and unchanged.  Continue oxygen as prescribed.   Patient expressed understanding and was in agreement with this plan. He also understands that He can call clinic at any time with any questions, concerns, or complaints.    Evalene JINNY Reusing, MD   07/16/2024 10:07 AM     "

## 2024-08-15 ENCOUNTER — Telehealth: Payer: Self-pay | Admitting: Oncology

## 2024-08-15 NOTE — Telephone Encounter (Signed)
 Pt spouse called to r/s lab/injection appt from tomorrow 1/29 to end of next week 2/6.  Appts r/s and confirmed.  Due to the rescheduling, I have went ahead and adjusted the future appts. I put in the appt notes to print out new AVS for pt when he is here 2/6

## 2024-08-16 ENCOUNTER — Inpatient Hospital Stay

## 2024-08-16 ENCOUNTER — Inpatient Hospital Stay: Attending: Oncology

## 2024-08-23 ENCOUNTER — Telehealth: Payer: Self-pay | Admitting: Oncology

## 2024-08-23 NOTE — Telephone Encounter (Signed)
 Pt spouse called to r/s lab and inj appts - r/s w/pt spouse - pt spouse requested new AVS sent via mail - LH

## 2024-08-24 ENCOUNTER — Inpatient Hospital Stay

## 2024-09-14 ENCOUNTER — Inpatient Hospital Stay

## 2024-09-21 ENCOUNTER — Inpatient Hospital Stay

## 2024-10-05 ENCOUNTER — Inpatient Hospital Stay: Admitting: Oncology

## 2024-10-05 ENCOUNTER — Inpatient Hospital Stay

## 2024-10-15 ENCOUNTER — Inpatient Hospital Stay

## 2024-10-15 ENCOUNTER — Inpatient Hospital Stay: Admitting: Oncology

## 2024-10-24 ENCOUNTER — Inpatient Hospital Stay

## 2024-10-24 ENCOUNTER — Inpatient Hospital Stay: Admitting: Oncology

## 2024-11-02 ENCOUNTER — Inpatient Hospital Stay

## 2024-11-02 ENCOUNTER — Inpatient Hospital Stay: Admitting: Oncology

## 2025-05-16 ENCOUNTER — Ambulatory Visit: Admitting: Physician Assistant
